# Patient Record
Sex: Male | Born: 1935 | Hispanic: No | Marital: Married | Smoking: Former smoker
Health system: Southern US, Community
[De-identification: ages and names within clinical notes are randomized; demographics above are authoritative.]

## PROBLEM LIST (undated history)

## (undated) DIAGNOSIS — C61 Malignant neoplasm of prostate: Secondary | ICD-10-CM

## (undated) DIAGNOSIS — E785 Hyperlipidemia, unspecified: Secondary | ICD-10-CM

## (undated) DIAGNOSIS — I10 Essential (primary) hypertension: Secondary | ICD-10-CM

## (undated) DIAGNOSIS — E119 Type 2 diabetes mellitus without complications: Secondary | ICD-10-CM

## (undated) DIAGNOSIS — R972 Elevated prostate specific antigen [PSA]: Secondary | ICD-10-CM

## (undated) DIAGNOSIS — K219 Gastro-esophageal reflux disease without esophagitis: Secondary | ICD-10-CM

## (undated) DIAGNOSIS — Z923 Personal history of irradiation: Secondary | ICD-10-CM

## (undated) HISTORY — DX: Hyperlipidemia, unspecified: E78.5

## (undated) HISTORY — DX: Elevated prostate specific antigen (PSA): R97.20

## (undated) HISTORY — DX: Gastro-esophageal reflux disease without esophagitis: K21.9

## (undated) HISTORY — PX: COLONOSCOPY: SHX174

## (undated) HISTORY — PX: REPLACEMENT TOTAL KNEE: SUR1224

## (undated) HISTORY — DX: Essential (primary) hypertension: I10

## (undated) HISTORY — DX: Type 2 diabetes mellitus without complications: E11.9

---

## 1998-04-30 HISTORY — PX: OTHER SURGICAL HISTORY: SHX169

## 1998-04-30 HISTORY — PX: ANGIOPLASTY: SHX39

## 1998-12-23 ENCOUNTER — Observation Stay (HOSPITAL_COMMUNITY): Admission: AD | Admit: 1998-12-23 | Discharge: 1998-12-24 | Payer: Self-pay | Admitting: Cardiology

## 1998-12-23 ENCOUNTER — Encounter: Payer: Self-pay | Admitting: Cardiology

## 2006-12-16 ENCOUNTER — Inpatient Hospital Stay (HOSPITAL_COMMUNITY): Admission: RE | Admit: 2006-12-16 | Discharge: 2006-12-19 | Payer: Self-pay | Admitting: Orthopedic Surgery

## 2007-08-06 ENCOUNTER — Inpatient Hospital Stay (HOSPITAL_COMMUNITY): Admission: RE | Admit: 2007-08-06 | Discharge: 2007-08-09 | Payer: Self-pay | Admitting: Orthopedic Surgery

## 2010-09-12 NOTE — Op Note (Signed)
NAMEBRADYN, Schwartz                  ACCOUNT NO.:  [id number]   MEDICAL RECORD NO.:  [id number]          PATIENT TYPE:  INP   LOCATION:  0001                         FACILITY:  [facility]   PHYSICIAN:  Ollen Gross, M.D.    DATE OF BIRTH:  03-24-1933   DATE OF PROCEDURE:  08/06/2007  DATE OF DISCHARGE:                               OPERATIVE REPORT   PREOPERATIVE DIAGNOSIS:  Osteoarthritis left knee.   POSTOPERATIVE DIAGNOSIS:  Osteoarthritis left knee.   PROCEDURE:  Left total knee arthroplasty.   SURGEON:  Ollen Gross, M.D.   ASSISTANT:  Alexzandrew L. Perkins, P.A.C.   ANESTHESIA:  General with postop Marcaine pain pump.   ESTIMATED BLOOD LOSS:  Minimal.   DRAINS:  Hemovac x1.   TOURNIQUET TIME:  Thirty-seven minutes at 300 mmHg.   COMPLICATIONS:  None.   CONDITION:  Stable to recovery.   CLINICAL NOTE:  Matthew Schwartz is a 77 year old male with end-stage arthritis  of the left knee with progressively worsening pain and dysfunction.  He  has had a previous right total knee arthroplasty and presents now for  left total knee arthroplasty.   PROCEDURE IN DETAIL:  After the successful administration of general  anesthetic a tourniquet was placed high on the left thigh and the left  lower extremity prepped and draped in the usual sterile fashion.  The  extremity was wrapped in Esmarch, knee flexed and tourniquet inflated to  300 mmHg.  Standard midline incision was made with a 10 blade through  the subcutaneous tissues to the level of the extensor mechanism.  Fresh  blade was used make a medial parapatellar arthrotomy.  The soft tissue  over the proximal medial tibia subperiosteally elevated to the joint  line with the knife and the semimembranosus bursa with a Cobb elevator.  Soft tissue laterally was elevated with attention being paid to avoid  patellar tendon on tibial tubercle.  Patella subluxed laterally, knee  flexed 90 degrees, ACL and PCL removed.  Drill was used to  create a  starting hole in the distal femur and the canal was thoroughly  irrigated.  The 5 degree left valgus alignment guide was placed and  referencing off the posterior condyles, rotations marked and the block  was pinned to remove 11 mm of the distal femur.  I took 11 because of a  preop flexion contracture.  Distal femoral resection was made with an  oscillating saw.  Sizing blocks placed and size 4 was most appropriate.  Rotations marked at the epicondylar axis.  Size 4 cutting block was  placed and the anterior-posterior and chamfer cuts made.   Tibia subluxed forward and menisci removed.  Extramedullary tibial  alignment guide was placed for referencing proximally at the medial  aspect of the tibial tubercle and distally along the second metatarsal  axis and the tibial crest.  Block was pinned to remove approximately 4  mm off the more deficient medial side.  Tibial resection was made with  an oscillating saw.  Peripheral osteophytes were removed.  Size 4 was  the most appropriate tibial component  and a proximal tibia repair with  the modular drill and keel punch for size 4.  Femoral preparation was  completed with intercondylar cut.   Size 4 mobile bearing tibial trial, size 4 posterior stabilized femoral  trial and a 10 mm posterior stabilized rotating platform inserted and  trial placed.  With the 10 we achieved full extension but had some varus  and valgus play.  I went to a 12.5 which we still achieved full  extension with excellent varus and valgus, anterior and posterior  stability throughout full range of motion.  Patella was everted and  thickness measured to be 27 mm.  Freehand resection was taken to 15 mm,  41 template was placed, lug holes were drilled, trial patella was placed  and it tracks normally.  Osteophytes were removed off the posterior  femur with the trial in place.  All trials were removed and the cut bone  surfaces prepared with pulsatile lavage.   Cement was mixed and once  ready for implantation the size 4 mobile bearing tibial tray, size 4  posterior stabilized femur and 41 patella are cemented into place.  Patella was held with the clamp.  Trial 12.5 insert was placed, knee  held in full extension, all extruded cement removed.  Once the cement  was fully hardened then the FloSeal was injected on the posterior  capsule.  The permanent 12.5 mm posterior stabilized rotating platform  insert was placed in the tibial tray.  FloSeal was injected in the  medial and lateral gutters and suprapatellar area.  Moist sponge was  placed and tourniquet released for a total time of 37 minutes.  The  sponges removed after 2 minutes and bleeding was stopped with  electrocautery.  I then thoroughly irrigated again.  I placed a Hemovac  drain and closed the arthrotomy with interrupted #1 PDS.  Flexion  against gravity was 135 degrees.  Subcu was closed with interrupted 2-0  Vicryl and subcuticular running 4-0 Monocryl.  Catheter for the Marcaine  pain pump was placed and the pump was initiated.  Steri-Strips and a  bulky sterile dressing were applied and drains hooked to suction.  He  was then placed into knee immobilizer, awakened and transported to  recovery in stable condition.      Ollen Gross, M.D.  Electronically Signed     FA/MEDQ  D:  08/06/2007  T:  08/06/2007  Job:  [id number]

## 2010-09-12 NOTE — Op Note (Signed)
NAMECARLISS, Matthew Schwartz                  ACCOUNT NO.:  [id number]   MEDICAL RECORD NO.:  [id number]          PATIENT TYPE:  INP   LOCATION:  N629                         FACILITY:  [facility]   PHYSICIAN:  Ollen Gross, M.D.    DATE OF BIRTH:  1933/01/20   DATE OF PROCEDURE:  12/16/2006  DATE OF DISCHARGE:                               OPERATIVE REPORT   PREOPERATIVE DIAGNOSIS:  Osteoarthritis right knee.   POSTOPERATIVE DIAGNOSIS:  Osteoarthritis right knee.   PROCEDURE:  Right total knee arthroplasty.   SURGEON:  Ollen Gross, M.D.   ASSISTANT:  Nurse assist.   ANESTHESIA:  General with postop Marcaine pain pump.   ESTIMATED BLOOD LOSS:  Minimal.   DRAINS:  None.   TOURNIQUET TIME:  40 minutes at 300 mmHg.   COMPLICATIONS:  None.   CONDITION:  Stable to recovery.   CLINICAL NOTE:  Mr. Vassell is a 77 year old male who has end-stage  arthritis of his right knee with progressively worsening pain and  dysfunction.  He has failed nonoperative management including injections  and presents for total knee arthroplasty.   PROCEDURE IN DETAIL:  After the successful administration of general  anesthetic a tourniquet is placed high in the right thigh; and the right  lower extremity is prepped and draped in the usual sterile fashion.  The  extremities wrapped in Esmarch, knee flexed, and tourniquet inflated to  300 mmHg.  Midline incision is made with a 10 blade through subcutaneous  tissue to the level of the extensor mechanism.  A fresh blade is used  make a medial parapatellar arthrotomy.  Soft tissue over the proximal  medial tibia is subperiosteally elevated to the joint line with the  knife; and into the semimembranosus bursa with a Cobb elevator.   Soft tissue laterally is elevated with attention being paid to avoid the  patellar tendon on tibial tubercle.  Patella subluxed laterally, knee  flexed 90 degrees, ACL and PCL removed.  Drill is used to create a  starting hole in the  distal femur; and canal is thoroughly irrigated.  A  5-degree right valgus alignment guide is placed.  Referencing off the  posterior condyles, rotation is marked; and approximately 11 mm is  resected off the distal femur.  I took 11 because of a preop flexion  contracture.  Distal femoral resection is made an oscillating saw.  Sizing blocks placed and a size 4 is most appropriate.  The rotation is  marked at the epicondylar axis sized for a cutting block placed; and the  anterior, posterior, and chamfer cuts are made.   The tibia is subluxed forward and the menisci are removed.  Extramedullary tibial alignment guide is placed referencing proximally,  at the medial aspect of the tibial tubercle and distally along the  second metatarsal axis and tibial crest.  The block is pinned to remove  10 mm of the nondeficient lateral side.  Tibial resection is made with  an oscillating saw.  A size 4 is the most appropriate tibial component;  and the proximal tibia is prepared with the modular  drill and keel punch  for a size 4.  Femoral preparation is completed with the intercondylar  cut.   A size 4 mobile bearing tibial trial, a size 4 posterior stabilized  femoral trial, and a 10-mm posterior stabilized rotating platform insert  trial are placed.  With the 10 full extension is achieved with excellent  varus and valgus balance throughout full range of motion.  Patella is  everted, and thickness measured to 23 mm.  Freehand resection was taken  13 mm, a 38 template is placed, lug holes were drilled, trial patella is  placed and it tracks normally.  Osteophytes removed off the posterior  femur with the trial in place.  All trials are removed and the cut bone  surfaces are prepared with pulsatile lavage.  Cement is mixed; and once  ready for implantation, a size 4 mobile bearing tibial tray, a size 4  posterior stabilized femur, and 38 patella are cemented into place.   The patella is held with  the clamp.  A Trial 10-mm insert is placed; and  the knee held in full extension and all extruded cement removed.  Once  the cement is fully hardened then the wound is copiously irrigated with  saline solution; and the trial insert removed.  The posterior capsule is  injected with FloSeal as is the suprapatellar area and medial and  lateral gutters.  The permanent 10-mm posterior stabilized rotating  platform insert is then placed into the tibial tray.  The tourniquet is  then released with a total time of 40 minutes.  Moist sponge is placed;  and after 2 minutes; it is removed, and any minor bleeding is identified  and stopped with cautery.  We irrigated, again, and then closed the  extensor mechanism with interrupted #1 PDS.  Flexion against gravity to  130 degrees.   Subcu is closed with interrupted 2-0 Vicryl, subcuticular running 4-0  Monocryl.  Catheter for the Marcaine pain pump is placed; and the pump  is initiated.  Steri-Strips and a bulky sterile dressing are applied.  He is placed into a knee immobilizer, awakened, and transported to  recovery in stable condition.      Ollen Gross, M.D.  Electronically Signed     FA/MEDQ  D:  12/16/2006  T:  12/16/2006  Job:  [id number]

## 2010-09-12 NOTE — H&P (Signed)
NAMECHARON, Matthew Schwartz                  ACCOUNT NO.:  [id number]   MEDICAL RECORD NO.:  [id number]        PATIENT TYPE:  LINP   LOCATION:                               FACILITY:  [facility]   PHYSICIAN:  Ollen Gross, M.D.    DATE OF BIRTH:  January 20, 1933   DATE OF ADMISSION:  12/16/2006  DATE OF DISCHARGE:                              HISTORY & PHYSICAL   CHIEF COMPLAINT:  Right knee pain.   HISTORY OF PRESENT ILLNESS:  The patient is a 77 year old male seen by  Dr. Lequita Halt for evaluation of both knees.  The right is more problematic  than the left.  He has a hard time getting around for quite some time  now.  He has been treated by Dr. Corliss Skains in the past conservatively  and also including multiple injections.  He is at stage now where the  injections are not helping.  He is seen by Dr. Lequita Halt and found to have  end-stage arthritis worse in the left bone-on-bone.  It is felt he has  reached the point where he would benefit from undergoing knee  replacement.  Risks and benefits have been discussed, and he elects to  proceed with surgery.  We do have a note from Dr. Catha Gosselin who felt  that he is medically cleared.  Dr. Clarene Duke put in his note that he has  also seen Dr. Amil Amen and cleared from a cardiology standpoint.  The  patient was subsequently admitted to the hospital.   ALLERGIES:  No known drug allergies.   CURRENT MEDICATIONS:  Metformin, isosorbide, Altace, Toprol, Lipitor,  nizatidine, Lozol, amlodipine, aspirin.   PAST MEDICAL HISTORY:  Hypertension, non-insulin-dependent diabetes  mellitus.   PAST SURGICAL HISTORY:  Cataract surgery in both eyes, heart  catheterization.   SOCIAL HISTORY:  Married, retired, nonsmoker.  No alcohol.  Two  daughters.   FAMILY HISTORY:  Mother with history of stroke and diabetes.  Brother  with history of stroke, diabetes.   REVIEW OF SYSTEMS:  GENERAL:  No fevers, chills, night sweats.  NEUROLOGICAL:  No seizures, syncope or paralysis.   RESPIRATORY:  Shortness of breath, productive cough or hemoptysis.  CARDIOVASCULAR:  No chest pain, angina, orthopnea.  GI:  No nausea, vomiting, diarrhea or  constipation.  GU:  No dysuria, hematuria or discharge.  MUSCULOSKELETAL:  Right knee.   PHYSICAL EXAMINATION:  VITAL SIGNS:  Pulse 64, respirations 12, blood  pressure 130/68.  GENERAL:  A 77 year old male well-nourished, well-developed, slightly  overweight.  No acute distress, mildly anxious.  He is accompanied by  his wife.  HEENT:  Normocephalic, atraumatic.  Pupils are round, reactive.  Oropharynx clear.  EOMs intact.  NECK:  Supple.  CHEST:  Clear, anterior posterior chest walls; no rhonchi, rales or  wheezing.  HEART:  Regular rate and rhythm.  No murmur, S1, S2 noted.  ABDOMEN:  Soft, slightly round, nontender.  Bowel sounds are present.  BREASTS/GENITALIA:  Not done and not pertinent to present illness.  EXTREMITIES:  Right knee shows no effusion.  Range of motion 10 to 115.  He has  a slight varus malalignment deformity.  No instability.   IMPRESSION:  1. Osteoarthritis right knee greater than osteoarthritis left knee.  2. Hypertension.  3. Non-insulin diabetes mellitus.   PLAN:  The patient will be admitted to [facility] to undergo a  right total knee replacement arthroplasty.  Surgery will be performed by  Dr. Ollen Gross.      Matthew Schwartz, P.A.C.      Ollen Gross, M.D.  Electronically Signed    ALP/MEDQ  D:  12/02/2006  T:  12/02/2006  Job:  [id number]   cc:   Caryn Bee L. Little, M.D.  Fax: [phone]   Francisca December, M.D.  Fax: [phone]   Ollen Gross, M.D.  Fax: [phone]

## 2010-09-12 NOTE — H&P (Signed)
NAMEKRISTON, Matthew Schwartz                  ACCOUNT NO.:  [id number]   MEDICAL RECORD NO.:  [id number]          PATIENT TYPE:  INP   LOCATION:  NA                           FACILITY:  [facility]   PHYSICIAN:  Ollen Gross, M.D.    DATE OF BIRTH:  12/05/1932   DATE OF ADMISSION:  08/06/2007  DATE OF DISCHARGE:                              HISTORY & PHYSICAL   DATE OF OFFICE VISIT HISTORY AND PHYSICAL:  Performed on August 01, 2007   CHIEF COMPLAINT:  Right knee pain.   HISTORY OF PRESENT ILLNESS:  The patient is a 77 year old male who has  been seen by Dr. Lequita Halt for bilateral knee pain.  He has undergone a  right total knee back in August of 2008 and has had a successful  replacement.  He has continued left knee pain and now presents for total  knee on the other side.   ALLERGIES:  NO KNOWN DRUG ALLERGIES.   CURRENT MEDICATIONS:  1. Toprol XL.  2. Lipitor.  3. Altace.  4. Metformin.  5. Nizatidine.  6. Lovaza.  7. Avodart.  8. Amlodipine.   PAST MEDICAL HISTORY:  1. Hypertension.  2. Non-insulin-dependent diabetes mellitus.  3. Hypercholesterolemia.  4. Past history of gastritis.  5. The patient states he also had some irritable bowel syndrome (the      constipation type).   PAST SURGICAL HISTORY:  1. Right total knee in August of 2008.  2. Cataracts in both eyes.  3. Heart catheterization in 2000.   SOCIAL HISTORY:  Married, retired.  Nonsmoker.  No alcohol.  Two  daughters.   FAMILY HISTORY:  Mother with a history of stroke and diabetes.  Father  deceased of natural causes.  Brother with a history of stroke and  diabetes.   REVIEW OF SYSTEMS:  GENERAL:  No fevers, chills, night sweats.  NEUROLOGIC:  No seizures, syncope or paralysis.  RESPIRATORY:  No  shortness of breath, productive cough or hemoptysis.  CARDIOVASCULAR:  No chest pain, angina, orthopnea.  GI:  Occasional constipation.  No  nausea, vomiting, no diarrhea.  GU:  No dysuria, hematuria, discharge.  MUSCULOSKELETAL:  Left knee.   PHYSICAL EXAMINATION:  VITAL SIGNS:  Pulse 64, respirations 18, blood  pressure 112/54.  GENERAL:  A 77 year old male, well-nourished, well-developed, tall  frame, slightly overweight, alert, oriented and cooperative.  HEENT:  Normocephalic, atraumatic.  Pupils are round and reactive.  Oropharynx clear.  Extraocular movements intact.  NECK:  Supple.  CHEST:  Clear anterior and posterior chest walls.  No rhonchi, rales or  wheezing.  HEART:  Regular rate and rhythm.  No murmur.  S1 and S2 noted.  ABDOMEN:  Soft, slightly round.  Bowel sounds present.  RECTAL/BREAST/GENITALIA:  Not done; not pertinent to present illness.  EXTREMITIES:  Left knee with varus malalignment deformity.  Range of  motion 10-110.  Marked crepitus on range of motion.  No instability.   IMPRESSION:  Osteoarthritis, left knee.   PLAN:  The patient was admitted to [facility] to undergo a  left total knee replacement arthroplasty.  Surgery will be performed by  Dr. Ollen Gross.      Alexzandrew L. Perkins, P.A.C.      Ollen Gross, M.D.  Electronically Signed    ALP/MEDQ  D:  08/06/2007  T:  08/06/2007  Job:  [id number]   cc:   Courtney Paris, M.D.  Fax: [phone]   Anna Genre. Little, M.D.  Fax: [phone]   Francisca December, M.D.  Fax: [phone]

## 2010-09-15 NOTE — Discharge Summary (Signed)
NAMEAERIK, Matthew Schwartz                  ACCOUNT NO.:  [id number]   MEDICAL RECORD NO.:  [id number]          PATIENT TYPE:  INP   LOCATION:  1606                         FACILITY:  [facility]   PHYSICIAN:  Ollen Gross, M.D.    DATE OF BIRTH:  1932/11/09   DATE OF ADMISSION:  12/16/2006  DATE OF DISCHARGE:  12/19/2006                               DISCHARGE SUMMARY   ADMITTING DIAGNOSIS:  1. Osteoarthritis right knee, greater than left knee.  2. Hypertension.  3. Non-insulin dependent diabetes mellitus.   DISCHARGE DIAGNOSES:  1. Osteoarthritis right knee, status post right total knee      arthroplasty.  2. Postop blood loss anemia, did not require transfusion.  3. Mild postop hyponatremia.  4. Hypertension.  5. Non-insulin dependent diabetes mellitus.   PROCEDURE:  08/18/20008 Right total knee surgery with Dr. Lequita Halt.  Tourniquet time 40 minute.   CONSULTATIONS:  None.   BRIEF HISTORY:  Mr. Mccord is a 77 year old male with end stage arthritis  of right knee, progressively worsening pain with flexion, failure  nonoperative management, now presents for total knee.   LABORATORY DATA:  CBC:  Hemoglobin 12.9, hematocrit 38.6, white blood  cell count 6.7. Postop hemoglobin 10.8, drift down to 9.2.  Last noted  H/H 8.8 and 26.5.  PT/PTT on admission 12.5 and 25 respectively.  INR  0.9.  Serial protime followed, last noted PTR 18.7and 1.5.  Chem panel on admission all within normal limits with the exception of  elevated potassium of 5.2.  Serial BMETs were followed and potassium  came down to normal level of 4.0.  Sodium did drop from 139, last noted  at 133 minimal low at discharge.  Urinalysis negative.  Blood group type  B positive.  Chest x-ray 12/09/2006:  No active disease.   HOSPITAL COURSE:  The patient was admitted to [facility],  tolerated the procedure well, later transferred to recovery room on  orthopedic floor, started on PCA and oral analgesic pain control  following surgery.  He actually did pretty well on the evening of  surgery. He started getting up out of bed on day 1, started weaning over  to oral meds.  By day 2, he was progressing with physical therapy  requiring minimal assist, walking about 60 feet.  Dressing was changed  on day 2, incision healing well, no sign of infection.  We discontinued  the PC, IVs and Foleys.  He continued to progress well and was ready to  go home by day 3, 12/19/2006.   DISCHARGE PLAN:  1. The patient was discharged home on 12/19/2006.  2. Discharge diagnosis: Please see above.  3. Discharge medications:  Percocet, Robaxin and Coumadin.  4. Diet:  Resume home diet.  5. Activity:  Weight bearing as tolerated right lower extremity.  Home      health PT and well nursing.  6. Followup:  2 weeks.  7. Disposition:  To home.  8. Condition on discharge:  Improved.      Alexzandrew L. Perkins, P.A.C.      Ollen Gross, M.D.  Electronically  Signed    ALP/MEDQ  D:  01/22/2007  T:  01/23/2007  Job:  [id number]   cc:   Caryn Bee L. Little, M.D.  Fax: [phone]   Francisca December, M.D.  Fax: [phone]   Ollen Gross, M.D.  Fax: [phone]

## 2010-09-15 NOTE — Discharge Summary (Signed)
NAMEJONAH, Matthew Schwartz                  ACCOUNT NO.:  [id number]   MEDICAL RECORD NO.:  [id number]          PATIENT TYPE:  INP   LOCATION:  1617                         FACILITY:  [facility]   PHYSICIAN:  Ollen Gross, M.D.    DATE OF BIRTH:  [birth year]   DATE OF ADMISSION:  08/06/2007  DATE OF DISCHARGE:  08/09/2007                               DISCHARGE SUMMARY   ADMITTING DIAGNOSES:  1. Osteoarthritis left knee.  2. Hypertension.  3. Non-insulin-dependent diabetes mellitus.  4. Hypercholesterolemia.  5. History of gastritis.  6. Irritable bowel syndrome (constipation type).   DISCHARGE DIAGNOSES:  1. Osteoarthritis left knee status post left total knee replacement      arthroplasty.  2. Acute blood loss anemia.  3. Status post transfusion without sequelae.  4. Postop hyponatremia improved.  5. Mild postop hypokalemia.  6. Hypertension.  7. Non-insulin-dependent diabetes mellitus.  8. Hypercholesterolemia.  9. History of gastritis.  10.Irritable bowel syndrome (constipation type).   PROCEDURES:  August 06, 2007, left total knee.   SURGEON:  Ollen Gross, M.D.   ASSISTANT:  Alexzandrew L. Perkins, P.A.C.   ANESTHESIA:  General.   TOURNIQUET TIME:  37 minutes.   CONSULTS:  None.   BRIEF HISTORY:  Matthew Schwartz is a 77 year old male with end-stage arthritis  of the left knee with progressive worsening pain and dysfunction,  previous right total knee and now presents for left total knee.   LABORATORY DATA:  Preop CBC showed a hemoglobin of 12.7, hematocrit of  37.4, white cell count 6.2, platelets 240.  Serial CBCs were followed.  Hemoglobin dropped down to 9.3 then to 7.7, given 2 units of blood, post  transfusion hemoglobin of 9.1 and 26.8.  PT/PTT preop 12.8 and 27  respectively.  Serial pro-times followed.  Last known PT/INR 19.9 and  1.6.  Chem panel on admission slightly elevated glucose of 133.  Remaining chem panel within normal limits.  Serial BMETs were followed.  Sodium did drop from 136 to 134 and back up to 136.  Potassium dropped  4.0 to 3.5, only dropped another tenth down to 3.4 by the time of  discharge.  Preop UA negative.  Blood group type B+.   HOSPITAL COURSE:  The patient was admitted to [facility] and  tolerated the procedure well and later transferred to the recovery room  on the orthopedic floor and started on PCA and p.o. analgesics for pain  control following surgery.  He was doing pretty well after surgery  except his belly felt a little bit uncomfortable.  He had had problems  with constipation, that was his biggest issue with his last surgery so  we started on medication to help with this.  Hemoglobin was 9.3 postop.  Blood pressure was stable.  Started back on his home meds but put some  of those on parameters and history of diabetes.  Started him on iron  supplement and started getting up with therapy and actually did  extremely well.  He got up and out of bed and walking.  By day 2 he was  walking over 190 feet.  Hemoglobin was a little bit further though on  day 2 down to 7.7.  We gave him 2 units of blood but his hemoglobin  responded well, it came back to 9.1.  Dressing change - incision looked  good.  Felt just needed a little bit more time in therapy.  Continued to  progress well and by August 09, 2007, hemoglobin was stable.  Working  with therapy, tolerating meds and discharged home.   DISCHARGE PLAN:  1. The patient was discharged home on August 09, 2007.  2. Discharge diagnoses - please see above.  3. Discharge meds are Percocet, Robaxin, Nu-Iron, Coumadin, over-the-      counter stool softener.   DIET:  Heart-healthy diabetic diet.   ACTIVITY:  Weightbearing as tolerated left leg total knee protocol.   HOME OT/PT:  Home with nursing.   FOLLOWUP:  In 2 weeks.   DISPOSITION:  Home.   CONDITION ON DISCHARGE:  Improved.      Alexzandrew L. Perkins, P.A.C.      Ollen Gross, M.D.   Electronically Signed    ALP/MEDQ  D:  09/11/2007  T:  09/11/2007  Job:  [id number]   cc:   Ollen Gross, M.D.  Fax: [phone]   Courtney Paris, M.D.  Fax: [phone]   Anna Genre. Little, M.D.  Fax: [phone]   Francisca December, M.D.  Fax: [phone]

## 2011-01-23 LAB — URINALYSIS, ROUTINE W REFLEX MICROSCOPIC
Glucose, UA: NEGATIVE
Hgb urine dipstick: NEGATIVE
Ketones, ur: NEGATIVE
Protein, ur: NEGATIVE

## 2011-01-23 LAB — BASIC METABOLIC PANEL
BUN: 10
CO2: 24
CO2: 24
CO2: 25
Chloride: 105
Chloride: 106
Creatinine, Ser: 0.77
GFR calc Af Amer: >60
GFR calc Af Amer: >60
GFR calc non Af Amer: >60
GFR calc non Af Amer: >60
Glucose, Bld: 146 — ABNORMAL HIGH
Glucose, Bld: 164 — ABNORMAL HIGH
Potassium: 3.4 — ABNORMAL LOW
Potassium: 3.5
Potassium: 4
Sodium: 136
Sodium: 137

## 2011-01-23 LAB — CBC
HCT: 22.1 — ABNORMAL LOW
HCT: 26.8 — ABNORMAL LOW
HCT: 27 — ABNORMAL LOW
HCT: 37.4 — ABNORMAL LOW
Hemoglobin: 12.7 — ABNORMAL LOW
Hemoglobin: 7.7 — CL
Hemoglobin: 9.1 — ABNORMAL LOW
Hemoglobin: 9.3 — ABNORMAL LOW
MCHC: 34.1
MCHC: 34.9
MCV: 80.4
MCV: 80.5
MCV: 80.7
Platelets: 143 — ABNORMAL LOW
Platelets: 240
RBC: 2.75 — ABNORMAL LOW
RBC: 3.36 — ABNORMAL LOW
RBC: 4.63
RDW: 15.4
RDW: 15.9 — ABNORMAL HIGH
RDW: 16 — ABNORMAL HIGH
WBC: 6.2
WBC: 8.2

## 2011-01-23 LAB — TYPE AND SCREEN

## 2011-01-23 LAB — COMPREHENSIVE METABOLIC PANEL
ALT: 20
AST: 20
Albumin: 3.7
Alkaline Phosphatase: 54
BUN: 12
CO2: 25
Calcium: 9.2
Chloride: 104
Creatinine, Ser: 0.89
GFR calc Af Amer: >60
GFR calc non Af Amer: >60
Glucose, Bld: 133 — ABNORMAL HIGH
Potassium: 4
Sodium: 136
Total Bilirubin: 0.8
Total Protein: 7

## 2011-01-23 LAB — PROTIME-INR
INR: 0.9
INR: 1.2
Prothrombin Time: 12.8
Prothrombin Time: 18.7 — ABNORMAL HIGH

## 2011-01-23 LAB — APTT: aPTT: 27

## 2011-02-09 LAB — CBC
HCT: 26.5 — ABNORMAL LOW
HCT: 27 — ABNORMAL LOW
Hemoglobin: 10.8 — ABNORMAL LOW
Hemoglobin: 8.8 — ABNORMAL LOW
Hemoglobin: 9.2 — ABNORMAL LOW
MCHC: 33.2
MCHC: 33.7
MCV: 80.8
MCV: 81.5
MCV: 81.9
RBC: 3.24 — ABNORMAL LOW
RBC: 3.98 — ABNORMAL LOW
WBC: 7.7
WBC: 8.4
WBC: 9.8

## 2011-02-09 LAB — TYPE AND SCREEN: ABO/RH(D): B POS

## 2011-02-09 LAB — BASIC METABOLIC PANEL
CO2: 26
Calcium: 8.5
Chloride: 104
Chloride: 106
Creatinine, Ser: 0.83
GFR calc Af Amer: >60
GFR calc non Af Amer: >60
Potassium: 4
Sodium: 133 — ABNORMAL LOW
Sodium: 137

## 2011-02-09 LAB — PROTIME-INR
INR: 1.1
Prothrombin Time: 14.1

## 2011-02-09 LAB — ABO/RH: ABO/RH(D): B POS

## 2011-02-12 LAB — COMPREHENSIVE METABOLIC PANEL
ALT: 27
AST: 22
CO2: 28
Chloride: 105
Creatinine, Ser: 0.78
GFR calc Af Amer: >60
GFR calc non Af Amer: >60
Glucose, Bld: 137 — ABNORMAL HIGH
Total Bilirubin: 0.7

## 2011-02-12 LAB — URINALYSIS, ROUTINE W REFLEX MICROSCOPIC
Glucose, UA: NEGATIVE
Hgb urine dipstick: NEGATIVE
Specific Gravity, Urine: 1.006
Urobilinogen, UA: 0.2
pH: 7.5

## 2011-02-12 LAB — CBC
Hemoglobin: 12.9 — ABNORMAL LOW
MCV: 80.9
RBC: 4.78
WBC: 6.7

## 2012-12-18 ENCOUNTER — Other Ambulatory Visit: Payer: Self-pay | Admitting: Urology

## 2012-12-18 DIAGNOSIS — R972 Elevated prostate specific antigen [PSA]: Secondary | ICD-10-CM

## 2012-12-26 ENCOUNTER — Ambulatory Visit (HOSPITAL_COMMUNITY)
Admission: RE | Admit: 2012-12-26 | Discharge: 2012-12-26 | Disposition: A | Discharge status: Home / Self Care | Payer: Medicare Other | Source: Ambulatory Visit | Attending: Urology | Admitting: Urology

## 2012-12-26 DIAGNOSIS — R599 Enlarged lymph nodes, unspecified: Secondary | ICD-10-CM | POA: Insufficient documentation

## 2012-12-26 DIAGNOSIS — R972 Elevated prostate specific antigen [PSA]: Secondary | ICD-10-CM

## 2012-12-26 DIAGNOSIS — Z8546 Personal history of malignant neoplasm of prostate: Secondary | ICD-10-CM | POA: Insufficient documentation

## 2012-12-26 LAB — CREATININE, SERUM
Creatinine, Ser: 0.81 mg/dL (ref 0.50–1.35)
GFR calc Af Amer: >90 mL/min (ref >90)
GFR calc non Af Amer: 84 mL/min — ABNORMAL LOW (ref >90)

## 2012-12-26 MED ORDER — GADOBENATE DIMEGLUMINE 529 MG/ML IV SOLN
19.0000 mL | Freq: Once | INTRAVENOUS | Status: AC | PRN
  Administered 2012-12-26: 19 mL via INTRAVENOUS

## 2013-06-14 ENCOUNTER — Encounter: Payer: Self-pay | Admitting: *Deleted

## 2013-06-14 DIAGNOSIS — K219 Gastro-esophageal reflux disease without esophagitis: Secondary | ICD-10-CM | POA: Insufficient documentation

## 2013-06-14 DIAGNOSIS — E785 Hyperlipidemia, unspecified: Secondary | ICD-10-CM | POA: Insufficient documentation

## 2013-06-14 DIAGNOSIS — I10 Essential (primary) hypertension: Secondary | ICD-10-CM | POA: Insufficient documentation

## 2013-10-22 ENCOUNTER — Encounter: Payer: Self-pay | Admitting: Cardiology

## 2013-11-18 ENCOUNTER — Ambulatory Visit: Payer: Medicare Other | Admitting: Cardiology

## 2013-11-19 ENCOUNTER — Encounter: Payer: Self-pay | Admitting: Cardiology

## 2013-12-23 DIAGNOSIS — C61 Malignant neoplasm of prostate: Secondary | ICD-10-CM

## 2013-12-23 HISTORY — PX: PROSTATE BIOPSY: SHX241

## 2013-12-23 HISTORY — DX: Malignant neoplasm of prostate: C61

## 2013-12-24 ENCOUNTER — Ambulatory Visit: Payer: Medicare Other | Admitting: Cardiology

## 2013-12-25 ENCOUNTER — Ambulatory Visit (INDEPENDENT_AMBULATORY_CARE_PROVIDER_SITE_OTHER): Payer: Medicare Other | Admitting: Cardiology

## 2013-12-25 ENCOUNTER — Encounter: Payer: Self-pay | Admitting: Cardiology

## 2013-12-25 VITALS — BP 138/76 | HR 84 | Ht 70.0 in | Wt 213.0 lb

## 2013-12-25 DIAGNOSIS — I2510 Atherosclerotic heart disease of native coronary artery without angina pectoris: Secondary | ICD-10-CM

## 2013-12-25 DIAGNOSIS — I10 Essential (primary) hypertension: Secondary | ICD-10-CM

## 2013-12-25 DIAGNOSIS — E7800 Pure hypercholesterolemia, unspecified: Secondary | ICD-10-CM

## 2013-12-25 NOTE — Patient Instructions (Signed)
The current medical regimen is effective;  continue present plan and medications.  Follow up in 1 year with Dr Skains.  You will receive a letter in the mail 2 months before you are due.  Please call us when you receive this letter to schedule your follow up appointment.  

## 2013-12-25 NOTE — Progress Notes (Signed)
[facility] [address], [place]  [address] Phone: [phone] Fax:  [phone]  Date:  12/25/2013   ID:  Matthew Schwartz, DOB 1932-07-31, MRN [id number]  PCP:  Gennette Pac, MD   History of Present Illness: Matthew Schwartz is a 80 y.o. male with coronary artery disease PTCA (no stent) to RCA, diabetes, hypertension, hyperlipidemia here for followup last stress test in 2007 showed mild to moderate ischemia in the inferoapical/apical lateral region. EF was 59%.   He has been doing very well with no exertional angina, no shortness of breath. He has a strictly vegetarian diet. He lives periodically with his children. He stated he had no income.  He told me about a story where he was concerned about the financial aspects of his heart disease when he was first diagnosed and Dr. Leonia Reeves was very kind regarding this matter. [name]    Wt Readings from Last 3 Encounters:  12/25/13 213 lb (96.616 kg)     Past Medical History  Diagnosis Date  . Diabetes mellitus without complication   . Hypertension   . Dyslipidemia   . GERD (gastroesophageal reflux disease)   . PSA elevation     No past surgical history on file.  Current Outpatient Prescriptions  Medication Sig Dispense Refill  . AMLODIPINE BESYLATE PO Take 10 mg by mouth.      Marland Kitchen aspirin 81 MG tablet Take 81 mg by mouth daily.      Marland Kitchen atorvastatin (LIPITOR) 40 MG tablet Take 40 mg by mouth daily.      Marland Kitchen dutasteride (AVODART) 0.5 MG capsule Take 0.5 mg by mouth daily.      . ISOSORBIDE MONONITRATE PO Take 60 mg by mouth.      . metFORMIN (GLUCOPHAGE) 500 MG tablet Take by mouth 2 (two) times daily with a meal.      . METOPROLOL SUCCINATE ER PO Take 50 mg by mouth.      . nizatidine (AXID) 150 MG capsule Take 150 mg by mouth 2 (two) times daily.      Marland Kitchen PROCTOSOL HC 2.5 % rectal cream       . ramipril (ALTACE) 10 MG capsule Take 10 mg by mouth daily.       No current facility-administered  medications for this visit.    Allergies:    Allergies  Allergen Reactions  . Losartan Potassium     Social History:  The patient  reports that he quit smoking about 20 years ago. He does not have any smokeless tobacco history on file. He reports that he does not drink alcohol or use illicit drugs.   Family History  Problem Relation Age of Onset  . CVA Mother   . Heart disease Sister   . Coronary artery disease Sister   . Heart disease Brother   . Coronary artery disease Brother   . Heart disease Brother   . Coronary artery disease Brother     ROS:  Please see the history of present illness.   No fevers, no chills, no orthopnea, no syncope, no PND  All other systems reviewed and negative.   PHYSICAL EXAM: VS:  BP 138/76  Pulse 84  Ht 5\' 10"  (1.778 m)  Wt 213 lb (96.616 kg)  BMI 30.56 kg/m2 Well nourished, well developed, in no acute distress HEENT: normal, [place]/AT, EOMI Neck: no JVD, normal carotid upstroke, no bruit Cardiac:  normal S1, S2; RRR; no  murmur Lungs:  clear to auscultation bilaterally, no wheezing, rhonchi or rales Abd: soft, nontender, no hepatomegaly, no bruits Ext: no edema, 2+ distal pulses Skin: warm and dry GU: deferred Neuro: no focal abnormalities noted, AAO x 3  EKG:  12/25/13-sinus rhythm, LVH, nonspecific ST-T wave changes Labs: LDL 78  ASSESSMENT AND PLAN:  1. Coronary artery disease-currently stable, doing well, no anginal symptoms. Aggressive secondary prevention. Current antianginal medications reviewed. We will continue. He appreciates with Dr. Ilda Foil did for him. 2. Hyperlipidemia-LDL goal less than 70. Atorvastatin. LDL currently 78. 3. Hypertension-well-controlled. Medications reviewed. 4. One-year followup  Signed, Candee Furbish, MD [facility]  12/25/2013 4:07 PM

## 2013-12-28 ENCOUNTER — Other Ambulatory Visit (HOSPITAL_COMMUNITY): Payer: Self-pay | Admitting: Physical Medicine and Rehabilitation

## 2013-12-28 ENCOUNTER — Other Ambulatory Visit: Payer: Self-pay | Admitting: Urology

## 2013-12-28 DIAGNOSIS — C61 Malignant neoplasm of prostate: Secondary | ICD-10-CM

## 2014-01-11 ENCOUNTER — Encounter (HOSPITAL_COMMUNITY)
Admission: RE | Admit: 2014-01-11 | Discharge: 2014-01-11 | Disposition: A | Discharge status: Home / Self Care | Payer: Medicare Other | Source: Ambulatory Visit | Attending: Physical Medicine and Rehabilitation | Admitting: Physical Medicine and Rehabilitation

## 2014-01-11 DIAGNOSIS — C61 Malignant neoplasm of prostate: Secondary | ICD-10-CM | POA: Insufficient documentation

## 2014-01-11 MED ORDER — TECHNETIUM TC 99M MEDRONATE IV KIT
27.5000 | PACK | Freq: Once | INTRAVENOUS | Status: AC | PRN
  Administered 2014-01-11: 27.5 via INTRAVENOUS

## 2014-01-28 ENCOUNTER — Other Ambulatory Visit: Payer: Self-pay | Admitting: Urology

## 2014-01-28 DIAGNOSIS — R599 Enlarged lymph nodes, unspecified: Secondary | ICD-10-CM

## 2014-01-28 DIAGNOSIS — R591 Generalized enlarged lymph nodes: Secondary | ICD-10-CM

## 2014-01-28 DIAGNOSIS — C61 Malignant neoplasm of prostate: Secondary | ICD-10-CM

## 2014-02-09 ENCOUNTER — Encounter (HOSPITAL_COMMUNITY): Payer: Self-pay | Admitting: Pharmacy Technician

## 2014-02-11 ENCOUNTER — Other Ambulatory Visit: Payer: Self-pay | Admitting: Radiology

## 2014-02-12 ENCOUNTER — Ambulatory Visit (HOSPITAL_COMMUNITY)
Admission: RE | Admit: 2014-02-12 | Discharge: 2014-02-12 | Disposition: A | Discharge status: Home / Self Care | Payer: Medicare Other | Source: Ambulatory Visit | Attending: Urology | Admitting: Urology

## 2014-02-12 ENCOUNTER — Encounter (HOSPITAL_COMMUNITY): Payer: Self-pay

## 2014-02-12 DIAGNOSIS — C61 Malignant neoplasm of prostate: Secondary | ICD-10-CM | POA: Diagnosis not present

## 2014-02-12 DIAGNOSIS — R591 Generalized enlarged lymph nodes: Secondary | ICD-10-CM | POA: Diagnosis present

## 2014-02-12 DIAGNOSIS — R599 Enlarged lymph nodes, unspecified: Secondary | ICD-10-CM

## 2014-02-12 LAB — CBC
HEMATOCRIT: 39.2 % (ref 39.0–52.0)
HEMOGLOBIN: 13.2 g/dL (ref 13.0–17.0)
MCH: 27.1 pg (ref 26.0–34.0)
MCHC: 33.7 g/dL (ref 30.0–36.0)
MCV: 80.5 fL (ref 78.0–100.0)
Platelets: 252 10*3/uL (ref 150–400)
RBC: 4.87 MIL/uL (ref 4.22–5.81)
RDW: 15.3 % (ref 11.5–15.5)
WBC: 7.1 10*3/uL (ref 4.0–10.5)

## 2014-02-12 LAB — GLUCOSE, CAPILLARY: Glucose-Capillary: 129 mg/dL — ABNORMAL HIGH (ref 70–99)

## 2014-02-12 LAB — APTT: APTT: 29 s (ref 24–37)

## 2014-02-12 LAB — PROTIME-INR
INR: 1.01 (ref 0.00–1.49)
Prothrombin Time: 13.4 s (ref 11.6–15.2)

## 2014-02-12 MED ORDER — MIDAZOLAM HCL 2 MG/2ML IJ SOLN
INTRAMUSCULAR | Status: DC
Start: 2014-02-12 — End: 2014-02-13
  Filled 2014-02-12: qty 2

## 2014-02-12 MED ORDER — FENTANYL CITRATE 0.05 MG/ML IJ SOLN
INTRAMUSCULAR | Status: AC | PRN
  Administered 2014-02-12: 100 ug via INTRAVENOUS

## 2014-02-12 MED ORDER — MIDAZOLAM HCL 2 MG/2ML IJ SOLN
INTRAMUSCULAR | Status: AC | PRN
  Administered 2014-02-12: 2 mg via INTRAVENOUS
  Administered 2014-02-12: 1 mg via INTRAVENOUS

## 2014-02-12 MED ORDER — FENTANYL CITRATE 0.05 MG/ML IJ SOLN
INTRAMUSCULAR | Status: AC
  Filled 2014-02-12: qty 2

## 2014-02-12 MED ORDER — HYDROCODONE-ACETAMINOPHEN 5-325 MG PO TABS
1.0000 | ORAL_TABLET | ORAL | Status: DC | PRN
  Filled 2014-02-12: qty 2

## 2014-02-12 MED ORDER — SODIUM CHLORIDE 0.9 % IV SOLN
INTRAVENOUS | Status: DC
  Administered 2014-02-12: 500 mL via INTRAVENOUS

## 2014-02-12 NOTE — H&P (Signed)
Chief Complaint: "I am here for a biopsy."  Referring Physician(s): Dahlstedt,Stephen M  History of Present Illness: Matthew Schwartz is a 80 y.o. male with history of high grade prostate cancer and follows with Dr. Diona Fanti. CT done in urology office revealed right pelvic lymphadenopathy and request is made for image guided biopsy. He denies any chest pain, shortness of breath or palpitations. He denies any active signs of bleeding or excessive bruising. He denies any recent fever or chills. The patient denies any history of sleep apnea or chronic oxygen use. He has previously tolerated sedation without complications.   Past Medical History  Diagnosis Date  . Diabetes mellitus without complication   . Hypertension   . Dyslipidemia   . GERD (gastroesophageal reflux disease)   . PSA elevation     Past Surgical History  Procedure Laterality Date  . Replacement total knee  2008/2009  . Angioplasty  2000  . Cataract surgery  2000  . Colonoscopy      Allergies: Losartan potassium  Medications: Prior to Admission medications   Medication Sig Start Date End Date Taking? Authorizing Provider  AMLODIPINE BESYLATE PO Take 10 mg by mouth every morning.    Yes Historical Provider, MD  aspirin 81 MG tablet Take 81 mg by mouth every morning.    Yes Historical Provider, MD  atorvastatin (LIPITOR) 40 MG tablet Take 40 mg by mouth at bedtime.    Yes Historical Provider, MD  dutasteride (AVODART) 0.5 MG capsule Take 0.5 mg by mouth at bedtime.   Yes Historical Provider, MD  isosorbide mononitrate (IMDUR) 60 MG 24 hr tablet Take 60 mg by mouth every evening.   Yes Historical Provider, MD  metFORMIN (GLUCOPHAGE-XR) 500 MG 24 hr tablet Take 500 mg by mouth 2 (two) times daily.   Yes Historical Provider, MD  metoprolol (LOPRESSOR) 50 MG tablet Take 50 mg by mouth 2 (two) times daily.   Yes Historical Provider, MD  nizatidine (AXID) 150 MG capsule Take 150 mg by mouth 2 (two) times daily as needed  for heartburn.    Yes Historical Provider, MD  Polyethylene Glycol 400 (BLINK TEARS OP) Apply 1 drop to eye daily as needed (dry eyes.).   Yes Historical Provider, MD  PROCTOSOL HC 2.5 % rectal cream Place 1 application rectally daily as needed for hemorrhoids or itching.  11/06/13  Yes Historical Provider, MD  ramipril (ALTACE) 10 MG capsule Take 10 mg by mouth 2 (two) times daily.    Yes Historical Provider, MD  ketoconazole (NIZORAL) 2 % cream Apply 1 application topically daily as needed for irritation.    Historical Provider, MD    Family History  Problem Relation Age of Onset  . CVA Mother   . Heart disease Sister   . Coronary artery disease Sister   . Heart disease Brother   . Coronary artery disease Brother   . Heart disease Brother   . Coronary artery disease Brother     History   Social History  . Marital Status: Married    Spouse Name: N/A    Number of Children: N/A  . Years of Education: N/A   Social History Main Topics  . Smoking status: Former Smoker    Quit date: 06/14/1993  . Smokeless tobacco: Not on file  . Alcohol Use: No  . Drug Use: No  . Sexual Activity: Not on file   Other Topics Concern  . Not on file   Social History Narrative  . No  narrative on file    Review of Systems: A 12 point ROS discussed and pertinent positives are indicated in the HPI above.  All other systems are negative.  Review of Systems    Vital Signs: BP 153/77  Pulse 68  Temp(Src) 98.4 F (36.9 C) (Oral)  Resp 16  Ht 5\' 10"  (1.778 m)  Wt 210 lb (95.255 kg)  BMI 30.13 kg/m2  SpO2 100%  Physical Exam  Constitutional: He is oriented to person, place, and time. He appears well-developed and well-nourished. No distress.  HENT:  Head: Normocephalic and atraumatic.  Neck: No tracheal deviation present.  Cardiovascular: Normal rate and regular rhythm.  Exam reveals no gallop and no friction rub.   No murmur heard. Pulmonary/Chest: Effort normal and breath sounds normal.  No respiratory distress. He has no wheezes. He has no rales.  Abdominal: Soft. Bowel sounds are normal. He exhibits no distension. There is no tenderness.  Neurological: He is alert and oriented to person, place, and time.  Skin: Skin is warm and dry. He is not diaphoretic.  Psychiatric: He has a normal mood and affect. His behavior is normal. Thought content normal.    Imaging: No results found.  Labs:  CBC:  Recent Labs  02/12/14 0930  WBC 7.1  HGB 13.2  HCT 39.2  PLT 252    COAGS:  Recent Labs  02/12/14 0930  INR 1.01  APTT 29    Assessment and Plan: History of high grade prostate cancer Right pelvic lymphadenopathy on CT done in Urology office Request for image guided right pelvic lymph node biopsy with moderate sedation Patient has been NPO, no blood thinners taken, labs reviewed Risks and Benefits discussed with the patient. All of the patient's questions were answered, patient is agreeable to proceed. Consent signed and in chart.    SignedHedy Jacob 02/12/2014, 10:39 AM

## 2014-02-12 NOTE — Discharge Instructions (Signed)
Conscious Sedation, Adult, Care After Refer to this sheet in the next few weeks. These instructions provide you with information on caring for yourself after your procedure. Your health care provider may also give you more specific instructions. Your treatment has been planned according to current medical practices, but problems sometimes occur. Call your health care provider if you have any problems or questions after your procedure. WHAT TO EXPECT AFTER THE PROCEDURE  After your procedure:  You may feel sleepy, clumsy, and have poor balance for several hours.  Vomiting may occur if you eat too soon after the procedure. HOME CARE INSTRUCTIONS  Do not participate in any activities where you could become injured for at least 24 hours. Do not:  Drive.  Swim.  Ride a bicycle.  Operate heavy machinery.  Cook.  Use power tools.  Climb ladders.  Work from a high place.  Do not make important decisions or sign legal documents until you are improved.  If you vomit, drink water, juice, or soup when you can drink without vomiting. Make sure you have little or no nausea before eating solid foods.  Only take over-the-counter or prescription medicines for pain, discomfort, or fever as directed by your health care provider.  Make sure you and your family fully understand everything about the medicines given to you, including what side effects may occur.  You should not drink alcohol, take sleeping pills, or take medicines that cause drowsiness for at least 24 hours.  If you smoke, do not smoke without supervision.  If you are feeling better, you may resume normal activities 24 hours after you were sedated.  Keep all appointments with your health care provider. SEEK MEDICAL CARE IF:  Your skin is pale or bluish in color.  You continue to feel nauseous or vomit.  Your pain is getting worse and is not helped by medicine.  You have bleeding or swelling.  You are still sleepy or  feeling clumsy after 24 hours. SEEK IMMEDIATE MEDICAL CARE IF:  You develop a rash.  You have difficulty breathing.  You develop any type of allergic problem.  You have a fever. MAKE SURE YOU:  Understand these instructions.  Will watch your condition.  Will get help right away if you are not doing well or get worse. Document Released: 02/04/2013 Document Reviewed: 02/04/2013 [facility] Patient Information 2015 [place], [place]. This information is not intended to replace advice given to you by your health care provider. Make sure you discuss any questions you have with your health care provider.  Remove dressing in 24 hours, after 1125, if site becomes red, lots of drainage call doctor.

## 2014-02-12 NOTE — Procedures (Signed)
CT core bx R pelvic LN 18g x3 to surg path No complication No blood loss. See complete dictation in [facility].

## 2014-02-17 ENCOUNTER — Encounter: Payer: Self-pay | Admitting: Radiation Oncology

## 2014-02-17 NOTE — Progress Notes (Signed)
GU Location of Tumor / Histology: Adenocarcinoma of the Prostate, Right and Left Lobes   Prostate Cancer, Gleason Score is (3 + 4)- 2 cores right mid lateral and left mid medial, 20%/10%)) and (4+3)- Left Mid lateral, Left apex lateral, 20%)  PSA is (9.93))  Matthew Schwartz was diagnosed with prostate cancer in September of 2013. He had biopsies in June 2005,April 2006, June 2007, and July 2008.  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 He has been on Avodart since 2008 and prior to that his PSA was 14.15. Last biopsy on 01/14/2012 with PSA of 4.22 and  began Active Surveillance.  Repeat Biopsy 11/2013 with 4/12 cores with Adenocarcinoma.  Prostatic Volume 28.15ml   Biopsies of Prostate :  December 23, 2013  Prostate Cancer,  Gleason Score is (3 + 4)- 2 cores right mid lateral and left mid medial,  (20% and 10% of cores respectively)  Gleason Score (4+3)- Left Mid lateral, Left apex lateral,  (20% of Cores Respectively)   PSA is (9.93))   Past/Anticipated interventions by urology, if any: Dr. Franchot Gallo -  Avodart since 2008. Multiple Prostate Biopsies with last on 12/03/13  Past/Anticipated interventions by medical oncology, if any: None at this time  Weight changes, if any: no  Bowel/Bladder complaints, if any: IPSS 3  Nausea/Vomiting, if any: no   Pain issues, if any: no  SAFETY ISSUES:  Prior radiation? NO  Pacemaker/ICD? NO  Possible current pregnancy? NA  Is the patient on methotrexate? 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

## 2014-02-18 ENCOUNTER — Ambulatory Visit
Admission: RE | Admit: 2014-02-18 | Discharge: 2014-02-18 | Disposition: A | Discharge status: Home / Self Care | Payer: Medicare Other | Source: Ambulatory Visit | Attending: Radiation Oncology | Admitting: Radiation Oncology

## 2014-02-18 ENCOUNTER — Encounter: Payer: Self-pay | Admitting: Radiation Oncology

## 2014-02-18 ENCOUNTER — Telehealth: Payer: Self-pay | Admitting: *Deleted

## 2014-02-18 VITALS — BP 142/69 | HR 67 | Temp 98.1°F | Resp 20 | Ht 70.0 in | Wt 217.0 lb

## 2014-02-18 DIAGNOSIS — C61 Malignant neoplasm of prostate: Secondary | ICD-10-CM | POA: Insufficient documentation

## 2014-02-18 DIAGNOSIS — Z510 Encounter for antineoplastic radiation therapy: Secondary | ICD-10-CM | POA: Insufficient documentation

## 2014-02-18 HISTORY — DX: Malignant neoplasm of prostate: C61

## 2014-02-18 NOTE — Progress Notes (Signed)
Please see the Nurse Progress Note in the MD Initial Consult Encounter for this patient. 

## 2014-02-18 NOTE — Telephone Encounter (Signed)
CALLED PATIENT TO INFORM OF APPT. WITH DR. DAHLSTEDT'S OFFICE ON 04-14-14 - ARRIVAL TIME - 1:30 PM AND HIS RETURN VISIT WITH DR. Valere Dross ON 04-27-14 - ARRIVAL TIME- 8:30 AM, SPOKE WITH PATIENT AND HE IS AWARE OF THESE APPTS.

## 2014-02-18 NOTE — Progress Notes (Signed)
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 male who is seen today through the courtesy of Dr. Diona Fanti for consideration of radiation therapy in the management of his stage T1c high-risk adenocarcinoma prostate. He has a lengthy urologic history with previous prostate biopsies in June of 2005, April 2006, June of 2007 and July 2008. 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 His biopsy on 01/14/2012 showed one focus of Gleason 6 (3+3) involving 40% of one core from the left mid gland. He is been on Avodart since 2008. Prior to initiation of Avodart his PSA was 14.15. A followup PSA in August 2009 3.59. Nadir PSA was 3.17 in November 2010. Since then his PSA has been slowly rising. Dr. Jasmine December obtained a prostate MRI scan on 12/26/2012. There were no suspicious areas within the peripheral zone. He was felt to have borderline enlarged right pelvic lymph nodes, felt to be reactive. He did undergo a CT-guided biopsy 02/12/2014 which was benign. His PSA rose to 9.98 on 12/23/2013. Ultrasound-guided prostate biopsies on 12/23/2013 revealed Gleason 7 (4+3) involving 20% of one core from the left lateral mid gland and 20% of one core from left lateral apex. He also had Gleason 7 (3+4) involving 20% of one core of the right lateral mid gland and 10% of one core from the left mid gland. His prostate volume was 20.8 cc. CT scans of the abdomen/pelvis and bone scan were without evidence for metastatic disease. He is doing reasonably well from a GU and GI standpoint. His I PSS score is 3. He does have erectile dysfunction and is not sexually active.  PREVIOUS  RADIATION THERAPY: No   PAST MEDICAL HISTORY:  has a past medical history of Diabetes mellitus without complication; Hypertension; Dyslipidemia; GERD (gastroesophageal reflux disease); PSA elevation; and Prostate cancer (12/23/13).     PAST SURGICAL HISTORY:  Past Surgical History  Procedure Laterality Date  . Replacement total knee  2008/2009    bilateral  . Angioplasty  2000  . Cataract surgery  2000  . Colonoscopy    . Prostate biopsy  12/23/13     FAMILY HISTORY: family history includes CVA in his mother; Coronary artery disease in his brother, brother, and sister; Heart disease in his brother, brother, and sister. His father died from "stress" at age 46. His mother died following a stroke at 55.   SOCIAL HISTORY:  reports that he quit smoking about 19 years ago. He does not have any smokeless tobacco history on file. He reports that he does not drink alcohol or use illicit drugs. Married, 2 children. 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   ALLERGIES: Losartan potassium   MEDICATIONS:  Current Outpatient Prescriptions  Medication Sig Dispense Refill  . AMLODIPINE BESYLATE PO Take 10 mg by mouth every morning.       Marland Kitchen aspirin 81 MG tablet Take 81 mg by mouth every morning.       Marland Kitchen atorvastatin (LIPITOR) 40 MG tablet Take 40 mg by mouth at bedtime.       . dutasteride (AVODART)  0.5 MG capsule Take 0.5 mg by mouth at bedtime.      . isosorbide mononitrate (IMDUR) 60 MG 24 hr tablet Take 60 mg by mouth every evening.      Marland Kitchen ketoconazole (NIZORAL) 2 % cream Apply 1 application topically daily as needed for irritation.      Marland Kitchen lactulose (CHRONULAC) 10 GM/15ML solution Take 10 g by mouth as needed for mild constipation.      . metFORMIN (GLUCOPHAGE-XR) 500 MG 24 hr tablet Take 500 mg by mouth 2 (two) times daily.      . metoprolol (LOPRESSOR) 50 MG tablet Take 50 mg by mouth 2 (two) times daily.      . nizatidine (AXID) 150 MG capsule Take  150 mg by mouth 2 (two) times daily as needed for heartburn.       . Polyethylene Glycol 400 (BLINK TEARS OP) Apply 1 drop to eye daily as needed (dry eyes.).      Marland Kitchen PROCTOSOL HC 2.5 % rectal cream Place 1 application rectally daily as needed for hemorrhoids or itching.       . ramipril (ALTACE) 10 MG capsule Take 10 mg by mouth 2 (two) times daily.        No current facility-administered medications for this encounter.     REVIEW OF SYSTEMS:  Pertinent items are noted in HPI.    PHYSICAL EXAM:  height is 5\' 10"  (1.778 m) and weight is 217 lb (98.431 kg). His oral temperature is 98.1 F (36.7 C). His blood pressure is 142/69 and his pulse is 67. His respiration is 20.   Alert and oriented [place] male appearing slightly younger than stated age. Head and neck examination: Grossly unremarkable. Nodes: Without palpable cervical or supraclavicular lymphadenopathy. Chest: Lungs clear. Abdomen: Without masses organomegaly. Back: Without spinal or CVA tenderness. Genitalia: Unremarkable to inspection. Rectal: I'm only able to palpate the inferior aspect of the prostate which is without focal induration or nodularity. Extremities: Without edema.   LABORATORY DATA:  Lab Results  Component Value Date   WBC 7.1 02/12/2014   HGB 13.2 02/12/2014   HCT 39.2 02/12/2014   MCV 80.5 02/12/2014   PLT 252 02/12/2014   Lab Results  Component Value Date   NA 136 08/09/2007   K 3.4* 08/09/2007   CL 106 08/09/2007   CO2 24 08/09/2007   Lab Results  Component Value Date   ALT 20 07/30/2007   AST 20 07/30/2007   ALKPHOS 54 07/30/2007   BILITOT 0.8 07/30/2007   PSA 9.98 (corrected approximately 20) from 12/23/2013.   IMPRESSION: StageT1c high-risk adenocarcinoma prostate. I explained to the patient and his wife that his prognosis is related to his stage, Gleason score, and PSA level. His stage is favorable while his Gleason score of 7 is of intermediate favorability and his corrected PSA of almost 20 is also of  intermediate favorability. Combined, he has high-risk disease. We discussed surgery versus continued active surveillance versus radiation therapy. Radiation therapy options include 8 weeks of external beam/IMRT or 5 weeks of external beam followed by a seed implant boost. He is leaning toward 8 weeks of external beam/IMRT. We also talked about androgen deprivation therapy which should be given for duration of 2 years if this is reasonably well tolerated. We discussed the potential acute and late toxicities of radiation therapy and also the side effects of androgen deprivation therapy. We also discussed a comfortably full bladder filling to minimize urinary toxicity during radiation therapy. We'll need  to have Dr. Diona Fanti place 3 gold seed markers for image guidance. I spoke with Dr. Alan Ripper nurse for initiation of androgen deprivation therapy. I will see the patient back for a followup visit in 2 months.  PLAN:  As discussed above.   I spent 60  minutes face to face with the patient and more than 50% of that time was spent in counseling and/or coordination of care.

## 2014-04-08 NOTE — Progress Notes (Signed)
GU Location of Tumor / Histology: Adenocarcinoma of the Prostate, Right and Left Lobes  Prostate Cancer, Gleason Score is (3 + 4)- 2 cores right mid lateral and left mid medial, 20%/10%)) and (4+3)- Left Mid lateral, Left apex lateral, 20%) PSA is (9.93))  Matthew Schwartz was diagnosed with prostate cancer in September of 2013. He had biopsies in June 2005,April 2006, June 2007, and July 2008. 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 He has been on Avodart since 2008 and prior to that his PSA was 14.15. Last biopsy on 01/14/2012 with PSA of 4.22 and began Active Surveillance. Repeat Biopsy 11/2013 with 4/12 cores with Adenocarcinoma. Prostatic Volume 28.58ml   Biopsies of Prostate :  December 23, 2013 Prostate Cancer,  Gleason Score is (3 + 4)- 2 cores right mid lateral and left mid medial,  (20% and 10% of cores respectively)  Gleason Score (4+3)- Left Mid lateral, Left apex lateral,  (20% of Cores Respectively)  PSA is (9.93))   Past/Anticipated interventions by urology, if any: Dr. Franchot Gallo - Avodart since 2008. Multiple Prostate Biopsies with last on 12/03/13  Past/Anticipated interventions by medical oncology, if any: None at this time  Weight changes, if any: no  Bowel/Bladder complaints, if any: IPSS 3  Nausea/Vomiting, if any: no   Pain issues, if any: no  SAFETY ISSUES:  Prior radiation? NO  Pacemaker/ICD? NO  Possible current pregnancy? NA  Is the patient on methotrexate? 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

## 2014-04-27 ENCOUNTER — Ambulatory Visit
Admission: RE | Admit: 2014-04-27 | Discharge: 2014-04-27 | Disposition: A | Discharge status: Home / Self Care | Payer: Medicare Other | Source: Ambulatory Visit | Attending: Radiation Oncology | Admitting: Radiation Oncology

## 2014-04-27 ENCOUNTER — Encounter: Payer: Self-pay | Admitting: Radiation Oncology

## 2014-04-27 VITALS — BP 138/63 | HR 67 | Temp 98.2°F | Resp 20 | Ht 70.0 in | Wt 211.7 lb

## 2014-04-27 DIAGNOSIS — Z510 Encounter for antineoplastic radiation therapy: Secondary | ICD-10-CM | POA: Insufficient documentation

## 2014-04-27 DIAGNOSIS — C61 Malignant neoplasm of prostate: Secondary | ICD-10-CM | POA: Diagnosis not present

## 2014-04-27 DIAGNOSIS — N529 Male erectile dysfunction, unspecified: Secondary | ICD-10-CM | POA: Insufficient documentation

## 2014-04-27 NOTE — Progress Notes (Signed)
Please see the Nurse Progress Note in the MD Initial Consult Encounter for this patient. 

## 2014-04-27 NOTE — Progress Notes (Addendum)
CC: Dr. [name], Dr. Franchot Gallo  Follow-up note:  Diagnosis: Stage TIc high-risk adenocarcinoma prostate  History: Matthew Schwartz is a pleasant 80 year old male who is seen today for review and further discussion of radiation therapy in the management of his stage TIc high-risk adenocarcinoma prostate.  I initially saw the patient in consultation on 02/18/2014. He has a lengthy urologic history with previous prostate biopsies in June of 2005, April 2006, June of 2007 and July 2008. 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 His biopsy on 01/14/2012 showed one focus of Gleason 6 (3+3) involving 40% of one core from the left mid gland. He is been on Avodart since 2008. Prior to initiation of Avodart his PSA was 14.15. A followup PSA in August 2009 3.59. Nadir PSA was 3.17 in November 2010. Since then his PSA has been slowly rising. Dr. Jasmine December obtained a prostate MRI scan on 12/26/2012. There were no suspicious areas within the peripheral zone. He was felt to have borderline enlarged right pelvic lymph nodes, felt to be reactive. He did undergo a CT-guided biopsy 02/12/2014 which was benign. His PSA rose to 9.98 on 12/23/2013. Ultrasound-guided prostate biopsies on 12/23/2013 revealed Gleason 7 (4+3) involving 20% of one core from the left lateral mid gland and 20% of one core from left lateral apex. He also had Gleason 7 (3+4) involving 20% of one core of the right lateral mid gland and 10% of one core from the left mid gland. His prostate volume was 20.8 cc. CT scans of the abdomen/pelvis and bone scan were without evidence for metastatic disease. He is doing reasonably well from a GU and GI standpoint.  He does have erectile dysfunction and is not sexually active.  He was started on androgen deprivation therapy in late October, and hip placement of 3 gold seed markers by Dr. Diona Fanti on 04/14/2014.  He is without new complaints today.  His previous I PSS score was 3.  Physical examination:  Alert and oriented. Filed Vitals:   04/27/14 0856  BP: 138/63  Pulse: 67  Temp: 98.2 F (36.8 C)  Resp: 20   Rectal examination not performed today.  Impression: Stage TIc high-risk adenocarcinoma prostate.  I explained to the patient was again his management options.  He elects 8 weeks of external beam/IMRT.  We discussed the potential acute and late toxicities of radiation therapy.  We discussed bladder filling to minimize urinary toxicity.  Consent is signed today.  Plan: I will have him return for CT simulation/treatment planning in mid January.  30 minutes was spent face-to-face with the patient, primarily counseling the patient and coordinating his care.

## 2014-05-10 ENCOUNTER — Ambulatory Visit
Admission: RE | Admit: 2014-05-10 | Discharge: 2014-05-10 | Disposition: A | Discharge status: Home / Self Care | Payer: Medicare Other | Source: Ambulatory Visit | Attending: Radiation Oncology | Admitting: Radiation Oncology

## 2014-05-10 DIAGNOSIS — Z510 Encounter for antineoplastic radiation therapy: Secondary | ICD-10-CM | POA: Diagnosis not present

## 2014-05-10 DIAGNOSIS — C61 Malignant neoplasm of prostate: Secondary | ICD-10-CM

## 2014-05-10 NOTE — Progress Notes (Signed)
Complex simulation/treatment planning note the patient was taken to the CT simulator.  A Vac lock immobilization device was constructed.  A red rubber tube was placed within the rectal vault.  He was then catheterized and contrast instilled into the bladder/urethra.  He was then scanned.  The CT data set was sent to the MIM planning system I contoured his prostate, seminal vesicles, bladder, and rectum.  I prescribing 7800 cGy in 40 sessions to his prostate PTV which represents the prostate was 0.8 cm except for 0.5 cm along the rectum.  I prescribing 5600 cGy in 40 sessions to his seminal vesicle PTV which represents the seminal vesicles +0.5 cm.  He is now ready for IMRT simulation/treatment planning.

## 2014-05-10 NOTE — Addendum Note (Signed)
Encounter addended by: Rexene Edison, MD on: 05/10/2014  7:44 AM<BR>     Documentation filed: Notes Section

## 2014-05-11 ENCOUNTER — Encounter: Payer: Self-pay | Admitting: Radiation Oncology

## 2014-05-11 DIAGNOSIS — Z510 Encounter for antineoplastic radiation therapy: Secondary | ICD-10-CM | POA: Diagnosis not present

## 2014-05-11 NOTE — Progress Notes (Signed)
IMRT simulation/treatment planning note: The patient completed IMRT simulation/treatment planning in the management of his carcinoma the prostate.  IMRT was chosen to decrease the risk for both acute and late bladder and rectal toxicity compared to 3-D conformal or conventional radiation therapy.  Dose volume histograms were obtained for the target structures including the prostate PTV which represents the prostate +0.8 cm except for 0.5 cm along the rectum.  We met our departmental goals.  Dose volume histograms were also obtained for the avoidance structures including the bladder, rectum, and femoral heads.  We also met our departmental goals.  I prescribing 7800 cGy in 40 sessions to his prostate PTV and 5600 cGy in 40 sessions to his seminal vesicle PTV which represents the seminal vesicles +0.5 cm.

## 2014-05-12 DIAGNOSIS — Z510 Encounter for antineoplastic radiation therapy: Secondary | ICD-10-CM | POA: Diagnosis not present

## 2014-05-19 ENCOUNTER — Ambulatory Visit
Admission: RE | Admit: 2014-05-19 | Discharge: 2014-05-19 | Disposition: A | Discharge status: Home / Self Care | Payer: Medicare Other | Source: Ambulatory Visit | Attending: Radiation Oncology | Admitting: Radiation Oncology

## 2014-05-19 DIAGNOSIS — C61 Malignant neoplasm of prostate: Secondary | ICD-10-CM

## 2014-05-19 DIAGNOSIS — Z510 Encounter for antineoplastic radiation therapy: Secondary | ICD-10-CM | POA: Diagnosis not present

## 2014-05-19 NOTE — Progress Notes (Signed)
Chart note: The patient began his VMAT IMRT today in the management of his carcinoma the prostate.  He is being treated with 2 sets of dynamic MLCs corresponding to one set of IMRT treatment devices [phone]).

## 2014-05-20 ENCOUNTER — Ambulatory Visit
Admission: RE | Admit: 2014-05-20 | Discharge: 2014-05-20 | Disposition: A | Discharge status: Home / Self Care | Payer: Medicare Other | Source: Ambulatory Visit | Attending: Radiation Oncology | Admitting: Radiation Oncology

## 2014-05-20 DIAGNOSIS — Z510 Encounter for antineoplastic radiation therapy: Secondary | ICD-10-CM | POA: Diagnosis not present

## 2014-05-21 ENCOUNTER — Ambulatory Visit: Payer: Medicare Other

## 2014-05-24 ENCOUNTER — Ambulatory Visit
Admission: RE | Admit: 2014-05-24 | Discharge: 2014-05-24 | Disposition: A | Discharge status: Home / Self Care | Payer: Medicare Other | Source: Ambulatory Visit | Attending: Radiation Oncology | Admitting: Radiation Oncology

## 2014-05-24 ENCOUNTER — Encounter: Payer: Self-pay | Admitting: Radiation Oncology

## 2014-05-24 VITALS — BP 142/69 | HR 63 | Temp 97.9°F | Resp 12 | Wt 217.0 lb

## 2014-05-24 DIAGNOSIS — C61 Malignant neoplasm of prostate: Secondary | ICD-10-CM

## 2014-05-24 DIAGNOSIS — Z510 Encounter for antineoplastic radiation therapy: Secondary | ICD-10-CM | POA: Diagnosis not present

## 2014-05-24 NOTE — Addendum Note (Signed)
Encounter addended by: Jenene Slicker, RN on: 05/24/2014 11:37 AM<BR>     Documentation filed: Notes Section

## 2014-05-24 NOTE — Progress Notes (Signed)
He is currently in no pain.  Pt reports urinary frequency, urgency and retention. Pt states they urinate 2 - 3 times per night.  Pt reports Constipation, a bowel movement every day, but reports stools are hard. Encouraged drinking plenty of water and prunes.   BP 142/69 mmHg  Pulse 63  Temp(Src) 97.9 F (36.6 C) (Oral)  Resp 12  Wt 217 lb (98.431 kg)  SpO2 100%

## 2014-05-24 NOTE — Progress Notes (Signed)
Pt here for patient teaching.  Radiation and You booklet given with areas of pertinence flagged and highlighted.  Reviewed diarrhea, fatigue, hair loss, nausea and vomiting, sexual and fertility changes, skin changes and urinary and bladder changes . Pt able to give teach back of use unscented/gentle soap, use baby wipes and drink plenty of water. Pt demonstrated understanding of information given and will contact nursing with any questions or concerns.

## 2014-05-24 NOTE — Progress Notes (Signed)
Weekly Management Note:  Site: Prostate Current Dose:  585  cGy Projected Dose: 7800  cGy  Narrative: The patient is seen today for routine under treatment assessment. CBCT/MVCT images/port films were reviewed. The chart was reviewed.   Bladder filling is satisfactory.  No new GU or GI difficulties.  Physical Examination:  Filed Vitals:   05/24/14 1052  BP: 142/69  Pulse: 63  Temp: 97.9 F (36.6 C)  Resp: 12  .  Weight: 217 lb (98.431 kg).  No change.  Impression: Tolerating radiation therapy well.  Plan: Continue radiation therapy as planned.

## 2014-05-25 ENCOUNTER — Ambulatory Visit
Admission: RE | Admit: 2014-05-25 | Discharge: 2014-05-25 | Disposition: A | Discharge status: Home / Self Care | Payer: Medicare Other | Source: Ambulatory Visit | Attending: Radiation Oncology | Admitting: Radiation Oncology

## 2014-05-25 DIAGNOSIS — Z510 Encounter for antineoplastic radiation therapy: Secondary | ICD-10-CM | POA: Diagnosis not present

## 2014-05-26 ENCOUNTER — Ambulatory Visit
Admission: RE | Admit: 2014-05-26 | Discharge: 2014-05-26 | Disposition: A | Discharge status: Home / Self Care | Payer: Medicare Other | Source: Ambulatory Visit | Attending: Radiation Oncology | Admitting: Radiation Oncology

## 2014-05-26 DIAGNOSIS — Z510 Encounter for antineoplastic radiation therapy: Secondary | ICD-10-CM | POA: Diagnosis not present

## 2014-05-27 ENCOUNTER — Ambulatory Visit
Admission: RE | Admit: 2014-05-27 | Discharge: 2014-05-27 | Disposition: A | Discharge status: Home / Self Care | Payer: Medicare Other | Source: Ambulatory Visit | Attending: Radiation Oncology | Admitting: Radiation Oncology

## 2014-05-27 DIAGNOSIS — Z510 Encounter for antineoplastic radiation therapy: Secondary | ICD-10-CM | POA: Diagnosis not present

## 2014-05-28 ENCOUNTER — Ambulatory Visit
Admission: RE | Admit: 2014-05-28 | Discharge: 2014-05-28 | Disposition: A | Discharge status: Home / Self Care | Payer: Medicare Other | Source: Ambulatory Visit | Attending: Radiation Oncology | Admitting: Radiation Oncology

## 2014-05-28 DIAGNOSIS — Z510 Encounter for antineoplastic radiation therapy: Secondary | ICD-10-CM | POA: Diagnosis not present

## 2014-05-31 ENCOUNTER — Ambulatory Visit
Admission: RE | Admit: 2014-05-31 | Discharge: 2014-05-31 | Disposition: A | Discharge status: Home / Self Care | Payer: Medicare Other | Source: Ambulatory Visit | Attending: Radiation Oncology | Admitting: Radiation Oncology

## 2014-05-31 ENCOUNTER — Encounter: Payer: Self-pay | Admitting: Radiation Oncology

## 2014-05-31 VITALS — BP 140/62 | HR 62 | Temp 97.9°F | Ht 70.0 in | Wt 218.4 lb

## 2014-05-31 DIAGNOSIS — Z510 Encounter for antineoplastic radiation therapy: Secondary | ICD-10-CM | POA: Diagnosis not present

## 2014-05-31 DIAGNOSIS — C61 Malignant neoplasm of prostate: Secondary | ICD-10-CM

## 2014-05-31 NOTE — Progress Notes (Signed)
Matthew Schwartz has received 8 fractions and he reports that his continues his norma of nocturia 2-3 times nightly and " a little" fatigue and takes short naps during the day.  He denies any dysuria, rectal irtitation, nor diarrhea.

## 2014-05-31 NOTE — Progress Notes (Signed)
Weekly Management Note:  Site: Prostate Current Dose:  1560  cGy Projected Dose: 7800  cGy  Narrative: The patient is seen today for routine under treatment assessment. CBCT/MVCT images/port films were reviewed. The chart was reviewed.   Bladder filling is satisfactory.  No new GU or GI difficulties.  Physical Examination:  Filed Vitals:   05/31/14 1045  BP: 140/62  Pulse: 62  Temp: 97.9 F (36.6 C)  .  Weight: 218 lb 6.4 oz (99.066 kg).  No change.  Impression: Tolerating radiation therapy well.  Plan: Continue radiation therapy as planned.

## 2014-06-01 ENCOUNTER — Ambulatory Visit
Admission: RE | Admit: 2014-06-01 | Discharge: 2014-06-01 | Disposition: A | Discharge status: Home / Self Care | Payer: Medicare Other | Source: Ambulatory Visit | Attending: Radiation Oncology | Admitting: Radiation Oncology

## 2014-06-01 DIAGNOSIS — Z510 Encounter for antineoplastic radiation therapy: Secondary | ICD-10-CM | POA: Diagnosis not present

## 2014-06-02 ENCOUNTER — Ambulatory Visit
Admission: RE | Admit: 2014-06-02 | Discharge: 2014-06-02 | Disposition: A | Discharge status: Home / Self Care | Payer: Medicare Other | Source: Ambulatory Visit | Attending: Radiation Oncology | Admitting: Radiation Oncology

## 2014-06-02 DIAGNOSIS — Z510 Encounter for antineoplastic radiation therapy: Secondary | ICD-10-CM | POA: Diagnosis not present

## 2014-06-03 ENCOUNTER — Ambulatory Visit
Admission: RE | Admit: 2014-06-03 | Discharge: 2014-06-03 | Disposition: A | Discharge status: Home / Self Care | Payer: Medicare Other | Source: Ambulatory Visit | Attending: Radiation Oncology | Admitting: Radiation Oncology

## 2014-06-03 DIAGNOSIS — Z510 Encounter for antineoplastic radiation therapy: Secondary | ICD-10-CM | POA: Diagnosis not present

## 2014-06-04 ENCOUNTER — Ambulatory Visit
Admission: RE | Admit: 2014-06-04 | Discharge: 2014-06-04 | Disposition: A | Discharge status: Home / Self Care | Payer: Medicare Other | Source: Ambulatory Visit | Attending: Radiation Oncology | Admitting: Radiation Oncology

## 2014-06-04 DIAGNOSIS — Z510 Encounter for antineoplastic radiation therapy: Secondary | ICD-10-CM | POA: Diagnosis not present

## 2014-06-07 ENCOUNTER — Ambulatory Visit
Admission: RE | Admit: 2014-06-07 | Discharge: 2014-06-07 | Disposition: A | Discharge status: Home / Self Care | Payer: Medicare Other | Source: Ambulatory Visit | Attending: Radiation Oncology | Admitting: Radiation Oncology

## 2014-06-07 ENCOUNTER — Encounter: Payer: Self-pay | Admitting: Radiation Oncology

## 2014-06-07 VITALS — BP 139/71 | HR 64 | Temp 98.0°F | Resp 12 | Wt 214.8 lb

## 2014-06-07 DIAGNOSIS — Z510 Encounter for antineoplastic radiation therapy: Secondary | ICD-10-CM | POA: Diagnosis not present

## 2014-06-07 DIAGNOSIS — C61 Malignant neoplasm of prostate: Secondary | ICD-10-CM

## 2014-06-07 NOTE — Progress Notes (Signed)
Weekly Management Note:  Site: Prostate Current Dose:  2535  cGy Projected Dose: 7800  cGy  Narrative: The patient is seen today for routine under treatment assessment. CBCT/MVCT images/port films were reviewed. The chart was reviewed.   Bladder filling is satisfactory.  No new GU or GI difficulties.  He believes that he is able to empty his bladder.  Urinary stream is fair.  Physical Examination:  Filed Vitals:   06/07/14 1053  BP: 139/71  Pulse: 64  Temp: 98 F (36.7 C)  Resp: 12  .  Weight: 214 lb 12.8 oz (97.433 kg).  No change.  Impression: Tolerating radiation therapy well.  Plan: Continue radiation therapy as planned.

## 2014-06-07 NOTE — Progress Notes (Signed)
He is currently in no pain. Pt complains of fatigue and loss of sleep Pt reports urinary frequency, urgency and retention. Pt states they urinate 2 - 3 times per night.  Pt reports Constipation, reports hard/firm stool, everyday.  Ongoing issue. BP 139/71 mmHg  Pulse 64  Temp(Src) 98 F (36.7 C) (Oral)  Resp 12  Wt 214 lb 12.8 oz (97.433 kg)  SpO2 100%

## 2014-06-08 ENCOUNTER — Ambulatory Visit
Admission: RE | Admit: 2014-06-08 | Discharge: 2014-06-08 | Disposition: A | Discharge status: Home / Self Care | Payer: Medicare Other | Source: Ambulatory Visit | Attending: Radiation Oncology | Admitting: Radiation Oncology

## 2014-06-08 DIAGNOSIS — Z510 Encounter for antineoplastic radiation therapy: Secondary | ICD-10-CM | POA: Diagnosis not present

## 2014-06-09 ENCOUNTER — Ambulatory Visit
Admission: RE | Admit: 2014-06-09 | Discharge: 2014-06-09 | Disposition: A | Discharge status: Home / Self Care | Payer: Medicare Other | Source: Ambulatory Visit | Attending: Radiation Oncology | Admitting: Radiation Oncology

## 2014-06-09 DIAGNOSIS — Z510 Encounter for antineoplastic radiation therapy: Secondary | ICD-10-CM | POA: Diagnosis not present

## 2014-06-10 ENCOUNTER — Ambulatory Visit
Admission: RE | Admit: 2014-06-10 | Discharge: 2014-06-10 | Disposition: A | Discharge status: Home / Self Care | Payer: Medicare Other | Source: Ambulatory Visit | Attending: Radiation Oncology | Admitting: Radiation Oncology

## 2014-06-10 DIAGNOSIS — Z510 Encounter for antineoplastic radiation therapy: Secondary | ICD-10-CM | POA: Diagnosis not present

## 2014-06-11 ENCOUNTER — Ambulatory Visit
Admission: RE | Admit: 2014-06-11 | Discharge: 2014-06-11 | Disposition: A | Discharge status: Home / Self Care | Payer: Medicare Other | Source: Ambulatory Visit | Attending: Radiation Oncology | Admitting: Radiation Oncology

## 2014-06-11 DIAGNOSIS — Z510 Encounter for antineoplastic radiation therapy: Secondary | ICD-10-CM | POA: Diagnosis not present

## 2014-06-14 ENCOUNTER — Ambulatory Visit: Payer: Medicare Other

## 2014-06-14 ENCOUNTER — Encounter: Payer: Self-pay | Admitting: Radiation Oncology

## 2014-06-15 ENCOUNTER — Ambulatory Visit
Admission: RE | Admit: 2014-06-15 | Discharge: 2014-06-15 | Disposition: A | Discharge status: Home / Self Care | Payer: Medicare Other | Source: Ambulatory Visit | Attending: Radiation Oncology | Admitting: Radiation Oncology

## 2014-06-15 DIAGNOSIS — Z510 Encounter for antineoplastic radiation therapy: Secondary | ICD-10-CM | POA: Diagnosis not present

## 2014-06-16 ENCOUNTER — Ambulatory Visit
Admission: RE | Admit: 2014-06-16 | Discharge: 2014-06-16 | Disposition: A | Discharge status: Home / Self Care | Payer: Medicare Other | Source: Ambulatory Visit | Attending: Radiation Oncology | Admitting: Radiation Oncology

## 2014-06-16 ENCOUNTER — Encounter: Payer: Self-pay | Admitting: Radiation Oncology

## 2014-06-16 VITALS — BP 144/64 | HR 67 | Temp 98.0°F | Resp 16 | Wt 213.6 lb

## 2014-06-16 DIAGNOSIS — Z510 Encounter for antineoplastic radiation therapy: Secondary | ICD-10-CM | POA: Diagnosis not present

## 2014-06-16 DIAGNOSIS — C61 Malignant neoplasm of prostate: Secondary | ICD-10-CM

## 2014-06-16 NOTE — Progress Notes (Signed)
Weight and vitals stable. Denies pain. Denies dysuria or hematuria. Denies diarrhea. Denies urgency, frequency or incontinence. Reports nocturia x 4. Denies fatigue.

## 2014-06-16 NOTE — Progress Notes (Signed)
Weekly Management Note:  Site: Prostate Current Dose:  3705   cGy Projected Dose: 7800  cGy  Narrative: The patient is seen today for routine under treatment assessment. CBCT/MVCT images/port films were reviewed. The chart was reviewed.   His bladder filling today is suboptimal but satisfactory.  No new GU or GI difficulty.  Physical Examination:  Filed Vitals:   06/16/14 1046  BP: 144/64  Pulse: 67  Temp: 98 F (36.7 C)  Resp: 16  .  Weight: 213 lb 9.6 oz (96.888 kg).  No change.  Impression: Tolerating radiation therapy well.  Plan: Continue radiation therapy as planned.

## 2014-06-17 ENCOUNTER — Ambulatory Visit
Admission: RE | Admit: 2014-06-17 | Discharge: 2014-06-17 | Disposition: A | Discharge status: Home / Self Care | Payer: Medicare Other | Source: Ambulatory Visit | Attending: Radiation Oncology | Admitting: Radiation Oncology

## 2014-06-17 DIAGNOSIS — Z510 Encounter for antineoplastic radiation therapy: Secondary | ICD-10-CM | POA: Diagnosis not present

## 2014-06-18 ENCOUNTER — Ambulatory Visit
Admission: RE | Admit: 2014-06-18 | Discharge: 2014-06-18 | Disposition: A | Discharge status: Home / Self Care | Payer: Medicare Other | Source: Ambulatory Visit | Attending: Radiation Oncology | Admitting: Radiation Oncology

## 2014-06-18 DIAGNOSIS — Z510 Encounter for antineoplastic radiation therapy: Secondary | ICD-10-CM | POA: Diagnosis not present

## 2014-06-21 ENCOUNTER — Encounter: Payer: Self-pay | Admitting: Radiation Oncology

## 2014-06-21 ENCOUNTER — Ambulatory Visit
Admission: RE | Admit: 2014-06-21 | Discharge: 2014-06-21 | Disposition: A | Discharge status: Home / Self Care | Payer: Medicare Other | Source: Ambulatory Visit | Attending: Radiation Oncology | Admitting: Radiation Oncology

## 2014-06-21 VITALS — BP 131/64 | HR 75 | Temp 97.7°F | Ht 70.0 in | Wt 215.6 lb

## 2014-06-21 DIAGNOSIS — Z510 Encounter for antineoplastic radiation therapy: Secondary | ICD-10-CM | POA: Diagnosis not present

## 2014-06-21 DIAGNOSIS — C61 Malignant neoplasm of prostate: Secondary | ICD-10-CM

## 2014-06-21 NOTE — Progress Notes (Signed)
Mr. Lariccia reports nocturia 3-4 times, and states that at night it can be difficult to start his stream.  He reports proctitis, but denies any loose or diarrheal stools. He reports "a little" fatigue.

## 2014-06-21 NOTE — Progress Notes (Signed)
Weekly Management Note:  Site: Prostate Current Dose:  4290  cGy Projected Dose: 7800  cGy  Narrative: The patient is seen today for routine under treatment assessment. CBCT/MVCT images/port films were reviewed. The chart was reviewed.   Bladder filling is satisfactory.  He does have mild proctitis.  His otherwise doing well.  Physical Examination:  Filed Vitals:   06/21/14 1055  BP: 131/64  Pulse: 75  Temp: 97.7 F (36.5 C)  .  Weight: 215 lb 9.6 oz (97.796 kg).  No change.  Impression: Tolerating radiation therapy well.  He may use Preparation H for his mild proctitis.  Plan: Continue radiation therapy as planned.

## 2014-06-22 ENCOUNTER — Ambulatory Visit
Admission: RE | Admit: 2014-06-22 | Discharge: 2014-06-22 | Disposition: A | Discharge status: Home / Self Care | Payer: Medicare Other | Source: Ambulatory Visit | Attending: Radiation Oncology | Admitting: Radiation Oncology

## 2014-06-22 DIAGNOSIS — Z510 Encounter for antineoplastic radiation therapy: Secondary | ICD-10-CM | POA: Diagnosis not present

## 2014-06-23 ENCOUNTER — Ambulatory Visit
Admission: RE | Admit: 2014-06-23 | Discharge: 2014-06-23 | Disposition: A | Discharge status: Home / Self Care | Payer: Medicare Other | Source: Ambulatory Visit | Attending: Radiation Oncology | Admitting: Radiation Oncology

## 2014-06-23 DIAGNOSIS — Z510 Encounter for antineoplastic radiation therapy: Secondary | ICD-10-CM | POA: Diagnosis not present

## 2014-06-24 ENCOUNTER — Ambulatory Visit
Admission: RE | Admit: 2014-06-24 | Discharge: 2014-06-24 | Disposition: A | Discharge status: Home / Self Care | Payer: Medicare Other | Source: Ambulatory Visit | Attending: Radiation Oncology | Admitting: Radiation Oncology

## 2014-06-24 DIAGNOSIS — Z510 Encounter for antineoplastic radiation therapy: Secondary | ICD-10-CM | POA: Diagnosis not present

## 2014-06-25 ENCOUNTER — Ambulatory Visit
Admission: RE | Admit: 2014-06-25 | Discharge: 2014-06-25 | Disposition: A | Discharge status: Home / Self Care | Payer: Medicare Other | Source: Ambulatory Visit | Attending: Radiation Oncology | Admitting: Radiation Oncology

## 2014-06-25 DIAGNOSIS — Z510 Encounter for antineoplastic radiation therapy: Secondary | ICD-10-CM | POA: Diagnosis not present

## 2014-06-28 ENCOUNTER — Encounter: Payer: Self-pay | Admitting: Radiation Oncology

## 2014-06-28 ENCOUNTER — Ambulatory Visit
Admission: RE | Admit: 2014-06-28 | Discharge: 2014-06-28 | Disposition: A | Discharge status: Home / Self Care | Payer: Medicare Other | Source: Ambulatory Visit | Attending: Radiation Oncology | Admitting: Radiation Oncology

## 2014-06-28 VITALS — BP 147/59 | HR 75 | Temp 97.8°F | Ht 70.0 in | Wt 218.1 lb

## 2014-06-28 DIAGNOSIS — Z510 Encounter for antineoplastic radiation therapy: Secondary | ICD-10-CM | POA: Diagnosis not present

## 2014-06-28 DIAGNOSIS — C61 Malignant neoplasm of prostate: Secondary | ICD-10-CM

## 2014-06-28 NOTE — Progress Notes (Signed)
Weekly Management Note:  Site: Prostate  Current Dose:  5265  cGy Projected Dose: 7800  cGy  Narrative: The patient is seen today for routine under treatment assessment. CBCT/MVCT images/port films were reviewed. The chart was reviewed.   Bladder filling is excellent.  No new GU or GI difficulties.  He sees Dr. Diona Fanti for a follow-up visit today.  Physical Examination:  Filed Vitals:   06/28/14 1045  BP: 147/59  Pulse: 75  Temp: 97.8 F (36.6 C)  .  Weight: 218 lb 1.6 oz (98.93 kg).  No change.  Impression: Tolerating radiation therapy well.  Plan: Continue radiation therapy as planned.

## 2014-06-29 ENCOUNTER — Ambulatory Visit
Admission: RE | Admit: 2014-06-29 | Discharge: 2014-06-29 | Disposition: A | Discharge status: Home / Self Care | Payer: Medicare Other | Source: Ambulatory Visit | Attending: Radiation Oncology | Admitting: Radiation Oncology

## 2014-06-29 DIAGNOSIS — Z510 Encounter for antineoplastic radiation therapy: Secondary | ICD-10-CM | POA: Diagnosis not present

## 2014-06-30 ENCOUNTER — Ambulatory Visit
Admission: RE | Admit: 2014-06-30 | Discharge: 2014-06-30 | Disposition: A | Discharge status: Home / Self Care | Payer: Medicare Other | Source: Ambulatory Visit | Attending: Radiation Oncology | Admitting: Radiation Oncology

## 2014-06-30 DIAGNOSIS — Z510 Encounter for antineoplastic radiation therapy: Secondary | ICD-10-CM | POA: Diagnosis not present

## 2014-07-01 ENCOUNTER — Ambulatory Visit
Admission: RE | Admit: 2014-07-01 | Discharge: 2014-07-01 | Disposition: A | Discharge status: Home / Self Care | Payer: Medicare Other | Source: Ambulatory Visit | Attending: Radiation Oncology | Admitting: Radiation Oncology

## 2014-07-01 DIAGNOSIS — Z510 Encounter for antineoplastic radiation therapy: Secondary | ICD-10-CM | POA: Diagnosis not present

## 2014-07-02 ENCOUNTER — Ambulatory Visit
Admission: RE | Admit: 2014-07-02 | Discharge: 2014-07-02 | Disposition: A | Discharge status: Home / Self Care | Payer: Medicare Other | Source: Ambulatory Visit | Attending: Radiation Oncology | Admitting: Radiation Oncology

## 2014-07-02 DIAGNOSIS — Z510 Encounter for antineoplastic radiation therapy: Secondary | ICD-10-CM | POA: Diagnosis not present

## 2014-07-05 ENCOUNTER — Encounter: Payer: Self-pay | Admitting: Radiation Oncology

## 2014-07-05 ENCOUNTER — Ambulatory Visit
Admission: RE | Admit: 2014-07-05 | Discharge: 2014-07-05 | Disposition: A | Discharge status: Home / Self Care | Payer: Medicare Other | Source: Ambulatory Visit | Attending: Radiation Oncology | Admitting: Radiation Oncology

## 2014-07-05 VITALS — BP 156/70 | HR 65 | Temp 97.5°F | Ht 70.0 in | Wt 216.5 lb

## 2014-07-05 DIAGNOSIS — C61 Malignant neoplasm of prostate: Secondary | ICD-10-CM

## 2014-07-05 NOTE — Progress Notes (Signed)
Weekly Management Note:  Site: Prostate Current Dose:  6240  cGy Projected Dose: 7800  cGy  Narrative: The patient is seen today for routine under treatment assessment. CBCT/MVCT images/port films were reviewed. The chart was reviewed.   Treatment is pending at the time this dictation.  He is having difficulty with constipation and pressure within the rectal area.  He generally takes lactulose twice a week, but taking it more often causes "too many" stools.  His bladder filling has been satisfactory.  He does report nocturia every 1-1/2 hours with intermittent mild dysuria.  Physical Examination:  Filed Vitals:   07/05/14 1015  BP: 156/70  Pulse: 65  Temp: 97.5 F (36.4 C)  .  Weight: 216 lb 8 oz (98.204 kg).  No change.  Impression: Tolerating radiation therapy well.  Plan: Continue radiation therapy as planned.

## 2014-07-05 NOTE — Progress Notes (Addendum)
Mr. Matthew Schwartz reports that he is voiding frequently at night, at every 1- 1 1/2 hours, with intermittent burning.  He denies any odor.  He reports pressure in the rectal area and he notes some difficulty in passing stool.  He states he only takes a stool softner twice a week, because taking them daily, resulting in "too many" stools.  Reports fatigue.

## 2014-07-06 ENCOUNTER — Ambulatory Visit
Admission: RE | Admit: 2014-07-06 | Discharge: 2014-07-06 | Disposition: A | Discharge status: Home / Self Care | Payer: Medicare Other | Source: Ambulatory Visit | Attending: Radiation Oncology | Admitting: Radiation Oncology

## 2014-07-06 DIAGNOSIS — Z510 Encounter for antineoplastic radiation therapy: Secondary | ICD-10-CM | POA: Diagnosis not present

## 2014-07-07 ENCOUNTER — Ambulatory Visit
Admission: RE | Admit: 2014-07-07 | Discharge: 2014-07-07 | Disposition: A | Discharge status: Home / Self Care | Payer: Medicare Other | Source: Ambulatory Visit | Attending: Radiation Oncology | Admitting: Radiation Oncology

## 2014-07-07 DIAGNOSIS — Z510 Encounter for antineoplastic radiation therapy: Secondary | ICD-10-CM | POA: Diagnosis not present

## 2014-07-08 ENCOUNTER — Ambulatory Visit
Admission: RE | Admit: 2014-07-08 | Discharge: 2014-07-08 | Disposition: A | Discharge status: Home / Self Care | Payer: Medicare Other | Source: Ambulatory Visit | Attending: Radiation Oncology | Admitting: Radiation Oncology

## 2014-07-08 DIAGNOSIS — Z510 Encounter for antineoplastic radiation therapy: Secondary | ICD-10-CM | POA: Diagnosis not present

## 2014-07-09 ENCOUNTER — Ambulatory Visit
Admission: RE | Admit: 2014-07-09 | Discharge: 2014-07-09 | Disposition: A | Discharge status: Home / Self Care | Payer: Medicare Other | Source: Ambulatory Visit | Attending: Radiation Oncology | Admitting: Radiation Oncology

## 2014-07-09 DIAGNOSIS — Z510 Encounter for antineoplastic radiation therapy: Secondary | ICD-10-CM | POA: Diagnosis not present

## 2014-07-12 ENCOUNTER — Encounter: Payer: Self-pay | Admitting: Radiation Oncology

## 2014-07-12 ENCOUNTER — Ambulatory Visit
Admission: RE | Admit: 2014-07-12 | Discharge: 2014-07-12 | Disposition: A | Discharge status: Home / Self Care | Payer: Medicare Other | Source: Ambulatory Visit | Attending: Radiation Oncology | Admitting: Radiation Oncology

## 2014-07-12 DIAGNOSIS — Z510 Encounter for antineoplastic radiation therapy: Secondary | ICD-10-CM | POA: Diagnosis not present

## 2014-07-13 ENCOUNTER — Encounter: Payer: Self-pay | Admitting: Radiation Oncology

## 2014-07-13 ENCOUNTER — Ambulatory Visit: Payer: Medicare Other

## 2014-07-13 ENCOUNTER — Ambulatory Visit
Admission: RE | Admit: 2014-07-13 | Discharge: 2014-07-13 | Disposition: A | Discharge status: Home / Self Care | Payer: Medicare Other | Source: Ambulatory Visit | Attending: Radiation Oncology | Admitting: Radiation Oncology

## 2014-07-13 VITALS — BP 138/75 | HR 68 | Temp 97.7°F | Resp 20 | Wt 217.0 lb

## 2014-07-13 DIAGNOSIS — Z510 Encounter for antineoplastic radiation therapy: Secondary | ICD-10-CM | POA: Diagnosis not present

## 2014-07-13 DIAGNOSIS — C61 Malignant neoplasm of prostate: Secondary | ICD-10-CM

## 2014-07-13 NOTE — Progress Notes (Signed)
Weekly Management Note:  Site: Prostate Current Dose:  7215  cGy Projected Dose: 7800  cGy  Narrative: The patient is seen today for routine under treatment assessment. CBCT/MVCT images/port films were reviewed. The chart was reviewed.   Bladder filling is satisfactory.  No new GU or GI difficulties.  Physical Examination:  Filed Vitals:   07/13/14 1041  BP: 138/75  Pulse: 68  Temp: 97.7 F (36.5 C)  Resp: 20  .  Weight: 217 lb (98.431 kg).  No change.  Impression: Tolerating radiation therapy well.  He will finish his radiation therapy this Friday.  Plan: Continue radiation therapy as planned.  One-month follow-up visit after completion of radiation therapy.

## 2014-07-13 NOTE — Progress Notes (Signed)
Weekly rad txs 37/40 nocturia 5x this past week, is fatigued, no hematuria, does have mild dysuria, pressure with bowel movements 2x day formed stool stated, does have gas in abdomen, 1 month follow appt,  card given, appetite good 10:46 AM

## 2014-07-14 ENCOUNTER — Ambulatory Visit
Admission: RE | Admit: 2014-07-14 | Discharge: 2014-07-14 | Disposition: A | Discharge status: Home / Self Care | Payer: Medicare Other | Source: Ambulatory Visit | Attending: Radiation Oncology | Admitting: Radiation Oncology

## 2014-07-14 ENCOUNTER — Ambulatory Visit: Payer: Medicare Other

## 2014-07-14 DIAGNOSIS — Z510 Encounter for antineoplastic radiation therapy: Secondary | ICD-10-CM | POA: Diagnosis not present

## 2014-07-15 ENCOUNTER — Ambulatory Visit: Payer: Medicare Other

## 2014-07-15 ENCOUNTER — Ambulatory Visit
Admission: RE | Admit: 2014-07-15 | Discharge: 2014-07-15 | Disposition: A | Discharge status: Home / Self Care | Payer: Medicare Other | Source: Ambulatory Visit | Attending: Radiation Oncology | Admitting: Radiation Oncology

## 2014-07-15 DIAGNOSIS — Z510 Encounter for antineoplastic radiation therapy: Secondary | ICD-10-CM | POA: Diagnosis not present

## 2014-07-16 ENCOUNTER — Ambulatory Visit
Admission: RE | Admit: 2014-07-16 | Discharge: 2014-07-16 | Disposition: A | Discharge status: Home / Self Care | Payer: Medicare Other | Source: Ambulatory Visit | Attending: Radiation Oncology | Admitting: Radiation Oncology

## 2014-07-16 DIAGNOSIS — Z510 Encounter for antineoplastic radiation therapy: Secondary | ICD-10-CM | POA: Diagnosis not present

## 2014-07-18 ENCOUNTER — Encounter: Payer: Self-pay | Admitting: Radiation Oncology

## 2014-07-18 NOTE — Progress Notes (Signed)
[facility] End of Treatment Note  Name:Matthew Schwartz  Date: 07/18/2014 JSE:[id number] DOB:08-18-1932   Status:outpatient    CC: Gennette Pac, MD  Dr. Franchot Gallo  REFERRING PHYSICIAN:  Dr. Franchot Gallo   DIAGNOSIS: StageT1c high risk adenocarcinoma prostate    INDICATION FOR TREATMENT: Curative   TREATMENT DATES: 05/18/2014 through 07/16/2014                          SITE/DOSE:  Prostate 7800 cGy in 40 sessions, seminal vesicles 5600 cGy in 40 sessions                          BEAMS/ENERGY:   Dual ARC VMAT IMRT with 6 MV photons                NARRATIVE:   The patient tolerated his treatment well with no significant GU or GI toxicity by completion of therapy.  He continues with his androgen deprivation therapy under the guidance and direction of Dr. Diona Fanti.                         PLAN: Routine followup in one month. Patient instructed to call if questions or worsening complaints in interim.

## 2014-08-11 ENCOUNTER — Ambulatory Visit: Payer: Medicare Other | Admitting: Radiation Oncology

## 2014-08-17 ENCOUNTER — Encounter: Payer: Self-pay | Admitting: Radiation Oncology

## 2014-08-18 ENCOUNTER — Encounter: Payer: Self-pay | Admitting: Radiation Oncology

## 2014-08-18 ENCOUNTER — Ambulatory Visit
Admission: RE | Admit: 2014-08-18 | Discharge: 2014-08-18 | Disposition: A | Discharge status: Home / Self Care | Payer: Medicare Other | Source: Ambulatory Visit | Attending: Radiation Oncology | Admitting: Radiation Oncology

## 2014-08-18 VITALS — BP 132/69 | HR 74 | Temp 97.7°F | Resp 20 | Ht 70.0 in | Wt 211.7 lb

## 2014-08-18 DIAGNOSIS — C61 Malignant neoplasm of prostate: Secondary | ICD-10-CM

## 2014-08-18 HISTORY — DX: Personal history of irradiation: Z92.3

## 2014-08-18 NOTE — Progress Notes (Signed)
CC: Dr. Franchot Gallo  Follow-up note:  Mr. Dudzinski visits today approximately 1 month following completion of external beam/IMRT along with androgen deprivation therapy in the management of his stage TIc high-risk adenocarcinoma prostate.  He is really doing well from a GU and GI standpoint.  He is almost back to his baseline habits.  He is having less fatigue and he denies hot flashes despite his ongoing androgen deprivation therapy.  He will see Dr. Diona Fanti for a follow-up visit in June.  Physical examination: Alert and oriented. Filed Vitals:   08/18/14 1010  BP: 132/69  Pulse: 74  Temp: 97.7 F (36.5 C)  Resp: 20   Rectal examination not performed today.  Impression: Satisfactory progress.  He is almost completely returned to his baseline urinary and bowel habits.  He'll continue with his androgen deprivation therapy under the direction of Dr. Diona Fanti.  Ideally this should be continued for a total of 2 years.  Plan: As above.  I've not scheduled the patient for a formal follow-up visit and I ask that Dr. Diona Fanti keep me posted on his progress.

## 2014-08-18 NOTE — Progress Notes (Signed)
Matthew Schwartz here for reassessment s/p RT to his pelvis for prostate cancer.  He denies any dysuria, has intermittent dribbling, urgency and incomplete emptying. He also reports decrease in rectal irritation and less fatigue since end of treatment.

## 2015-02-07 ENCOUNTER — Ambulatory Visit: Payer: Medicare Other | Admitting: Cardiology

## 2015-02-15 ENCOUNTER — Telehealth: Payer: Self-pay | Admitting: Oncology

## 2015-02-15 NOTE — Telephone Encounter (Signed)
NEW PATIENT APPT-S/W PATIENT AND GAVE NP APPT FOR 11/02 @ 1:30 W/DR. SHADAD REFERRING DR. Anson Fret DX- ANEMIA   REFERRAL INFORMATION SCANNED

## 2015-02-16 ENCOUNTER — Ambulatory Visit (INDEPENDENT_AMBULATORY_CARE_PROVIDER_SITE_OTHER): Payer: Medicare Other | Admitting: Cardiology

## 2015-02-16 ENCOUNTER — Encounter: Payer: Self-pay | Admitting: Cardiology

## 2015-02-16 VITALS — BP 146/68 | HR 80 | Ht 70.0 in | Wt 211.1 lb

## 2015-02-16 DIAGNOSIS — I2510 Atherosclerotic heart disease of native coronary artery without angina pectoris: Secondary | ICD-10-CM | POA: Diagnosis not present

## 2015-02-16 DIAGNOSIS — I10 Essential (primary) hypertension: Secondary | ICD-10-CM | POA: Diagnosis not present

## 2015-02-16 DIAGNOSIS — I2583 Coronary atherosclerosis due to lipid rich plaque: Principal | ICD-10-CM

## 2015-02-16 DIAGNOSIS — E785 Hyperlipidemia, unspecified: Secondary | ICD-10-CM

## 2015-02-16 DIAGNOSIS — M79604 Pain in right leg: Secondary | ICD-10-CM

## 2015-02-16 DIAGNOSIS — M79605 Pain in left leg: Secondary | ICD-10-CM

## 2015-02-16 NOTE — Progress Notes (Addendum)
[facility]. [address], [address], [place]  [address] Phone: [phone] Fax:  [phone]  Date:  02/16/2015   ID:  Matthew Schwartz, DOB [birth year], MRN [id number]  PCP:  Gennette Pac, MD   History of Present Illness: Matthew Schwartz is a 81 y.o. male with coronary artery disease PTCA (no stent) to RCA, diabetes, hypertension, hyperlipidemia here for followup last stress test in 2007 showed mild to moderate ischemia in the inferoapical/apical lateral region. EF was 59%.   He has been doing very well with no exertional angina, no shortness of breath. He has a strictly vegetarian diet. He lives periodically with his children. He stated he had no income.  He told me about a story where he was concerned about the financial aspects of his heart disease when he was first diagnosed and Dr. Leonia Reeves was very kind regarding this matter. [name]   he has noticed some leg discomfort and was wondering if this could be secondary to vascular/circulatory issues. When he sits at times, he has more pain in his feet. He does not seem to be limited with walking through the grocery for instance. Nonetheless, he does have risk factors for peripheral vascular disease. I was having trouble palpating his distal pulses today.     Wt Readings from Last 3 Encounters:  02/16/15 211 lb 1.9 oz (95.763 kg)  08/18/14 211 lb 11.2 oz (96.026 kg)  07/13/14 217 lb (98.431 kg)     Past Medical History  Diagnosis Date  . Diabetes mellitus without complication ([place])   . Hypertension   . Dyslipidemia   . GERD (gastroesophageal reflux disease)   . PSA elevation   . Prostate cancer ([place]) 12/23/13    Adenocarcinoma  . S/P radiation therapy 05/18/2014 through 07/16/2014     Prostate 7800 cGy in 40 sessions, seminal vesicles 5600 cGy in 40 sessions     Past Surgical History  Procedure Laterality Date  .  Replacement total knee  2008/2009    bilateral  . Angioplasty  2000  . Cataract surgery  2000  . Colonoscopy    . Prostate biopsy  12/23/13    Current Outpatient Prescriptions  Medication Sig Dispense Refill  . amLODipine (NORVASC) 10 MG tablet Take 10 mg by mouth daily.  1  . aspirin 81 MG tablet Take 81 mg by mouth every morning.     Marland Kitchen atorvastatin (LIPITOR) 40 MG tablet Take 40 mg by mouth at bedtime.     . dutasteride (AVODART) 0.5 MG capsule Take 0.5 mg by mouth at bedtime.    . finasteride (PROSCAR) 5 MG tablet TAKE 1 TABLET DAILY AS DIRECTED.  3  . isosorbide mononitrate (IMDUR) 60 MG 24 hr tablet Take 60 mg by mouth every evening.    Marland Kitchen ketoconazole (NIZORAL) 2 % cream Apply 1 application topically daily as needed for irritation.    Marland Kitchen lactulose (CHRONULAC) 10 GM/15ML solution Take 10 g by mouth as needed for mild constipation.    . metFORMIN (GLUCOPHAGE-XR) 500 MG 24 hr tablet Take 500 mg by mouth 2 (two) times daily.    . metoprolol succinate (TOPROL-XL) 50 MG 24 hr tablet Take 50 mg by mouth 2 (two) times daily.  1  . nizatidine (AXID) 150 MG capsule Take 150 mg by mouth 2 (two) times daily as needed for heartburn.     . Polyethylene Glycol 400 (BLINK TEARS OP) Apply 1 drop to eye daily as  needed (dry eyes.).    Marland Kitchen PROCTOSOL HC 2.5 % rectal cream Place 1 application rectally daily as needed for hemorrhoids or itching.     . ramipril (ALTACE) 10 MG capsule Take 10 mg by mouth 2 (two) times daily.      No current facility-administered medications for this visit.    Allergies:    Allergies  Allergen Reactions  . Losartan Potassium     Unknown reaction    Social History:  The patient  reports that he quit smoking about 20 years ago. He does not have any smokeless tobacco history on file. He reports that he does not drink alcohol or use illicit drugs.   Family History  Problem Relation Age of Onset  . CVA Mother   . Heart disease Sister   . Coronary artery disease Sister     . Heart disease Brother   . Coronary artery disease Brother   . Heart disease Brother   . Coronary artery disease Brother     ROS:  Please see the history of present illness.   No fevers, no chills, no orthopnea, no syncope, no PND  All other systems reviewed and negative.   PHYSICAL EXAM: VS:  BP 146/68 mmHg  Pulse 80  Ht 5\' 10"  (1.778 m)  Wt 211 lb 1.9 oz (95.763 kg)  BMI 30.29 kg/m2 Well nourished, well developed, in no acute distress HEENT: normal, [place]/AT, EOMI Neck: no JVD, normal carotid upstroke, no bruit Cardiac:  normal S1, S2; RRR; no murmur Lungs:  clear to auscultation bilaterally, no wheezing, rhonchi or rales Abd: soft, nontender, no hepatomegaly, no bruits Ext:  trace edema,  Difficult to palpate distal pulses Skin: warm and dry GU: deferred Neuro: no focal abnormalities noted, AAO x 3  EKG:  Today 02/16/15 - NSR 72 personally viewed , prior8/28/15-sinus rhythm, LVH, nonspecific ST-T wave changes Labs: LDL 78  ASSESSMENT AND PLAN:  1. Coronary artery disease-currently stable, doing well, no anginal symptoms. Aggressive secondary prevention. Current antianginal medications reviewed. We will continue. He appreciates with Dr. Leonia Reeves did for him. 2. Hyperlipidemia-LDL goal less than 70. Atorvastatin. LDL  In the past 78. 3. Hypertension-well-controlled. Medications reviewed. 4.  leg pain- with his diabetes, hypertension and prior coronary artery disease, I will go ahead and check lower extremity arterial Dopplers to see if there is any evidence of peripheral vascular disease present. Today, had trouble palpating his distal pulses. 5. One-year followup  Signed, Candee Furbish, MD [facility]  02/16/2015 12:08 PM

## 2015-02-16 NOTE — Patient Instructions (Signed)
Medication Instructions:  None  Labwork: None  Testing/Procedures: Your physician has requested that you have a lower extremity arterial duplex. This test is an ultrasound of the arteries in the legs or arms. It looks at arterial blood flow in the legs and arms. Allow one hour for Lower and Upper Arterial scans. There are no restrictions or special instructions   Follow-Up: Your physician wants you to follow-up in: 1 year with Dr. Marlou Porch.  You will receive a reminder letter in the mail two months in advance. If you don't receive a letter, please call our office to schedule the follow-up appointment.   Any Other Special Instructions Will Be Listed Below (If Applicable).

## 2015-02-17 ENCOUNTER — Other Ambulatory Visit: Payer: Self-pay | Admitting: Cardiology

## 2015-02-17 DIAGNOSIS — M79605 Pain in left leg: Principal | ICD-10-CM

## 2015-02-17 DIAGNOSIS — M79604 Pain in right leg: Secondary | ICD-10-CM

## 2015-02-28 ENCOUNTER — Ambulatory Visit (HOSPITAL_COMMUNITY)
Admission: RE | Admit: 2015-02-28 | Discharge: 2015-02-28 | Disposition: A | Discharge status: Home / Self Care | Payer: Medicare Other | Source: Ambulatory Visit | Attending: Cardiology | Admitting: Cardiology

## 2015-02-28 DIAGNOSIS — E119 Type 2 diabetes mellitus without complications: Secondary | ICD-10-CM | POA: Insufficient documentation

## 2015-02-28 DIAGNOSIS — M79606 Pain in leg, unspecified: Secondary | ICD-10-CM | POA: Diagnosis present

## 2015-02-28 DIAGNOSIS — E785 Hyperlipidemia, unspecified: Secondary | ICD-10-CM | POA: Diagnosis not present

## 2015-02-28 DIAGNOSIS — I10 Essential (primary) hypertension: Secondary | ICD-10-CM | POA: Diagnosis not present

## 2015-02-28 DIAGNOSIS — M79605 Pain in left leg: Secondary | ICD-10-CM | POA: Diagnosis not present

## 2015-02-28 DIAGNOSIS — M79604 Pain in right leg: Secondary | ICD-10-CM | POA: Diagnosis not present

## 2015-03-02 ENCOUNTER — Telehealth: Payer: Self-pay | Admitting: Oncology

## 2015-03-02 ENCOUNTER — Ambulatory Visit (HOSPITAL_BASED_OUTPATIENT_CLINIC_OR_DEPARTMENT_OTHER): Payer: Medicare Other | Admitting: Oncology

## 2015-03-02 VITALS — BP 134/58 | HR 72 | Temp 98.2°F | Resp 18 | Ht 70.0 in | Wt 216.5 lb

## 2015-03-02 DIAGNOSIS — C61 Malignant neoplasm of prostate: Secondary | ICD-10-CM | POA: Diagnosis not present

## 2015-03-02 DIAGNOSIS — D508 Other iron deficiency anemias: Secondary | ICD-10-CM

## 2015-03-02 DIAGNOSIS — D649 Anemia, unspecified: Secondary | ICD-10-CM | POA: Diagnosis not present

## 2015-03-02 NOTE — Telephone Encounter (Signed)
per pof to sch pt appt-gave pt copy of avs °

## 2015-03-02 NOTE — Progress Notes (Signed)
Please see consult note.  

## 2015-03-02 NOTE — Consult Note (Signed)
Reason for Referral: Anemia.   HPI: This is a pleasant [name] but currently lives in this area for the last 30 years. He gentleman with history of diabetes, hypertension and was diagnosed with prostate cancer in August 2015. He had Gleason score 4+3 = 7 and a PSA of 9.98. He was treated with external beam radiation therapy under the care of Dr. Valere Dross concluding in March 2016. He received a total of 7800 cGy in 40 fractions and 5600 cGy seminal vesicles. In October 2015 his hemoglobin was around 13.2 and most recently noted to have slight decline with hemoglobin in July 2016 was down to 11.1 and in 02/04/2015 his hemoglobin was 11.2 with a white cell count of 7.1 and a platelet count of 296. His MCV was 80.2. His Hemoccult testing was negative. His ferritin levels 108 which was normal. His vitamin T-61 and folic acid are all within normal range. His creatinine was 0.72 with a normal GFR. Patient referred to me for evaluation of mild chronic anemia. He was evaluated by gastroenterology and felt that no colonoscopy or endoscopy is needed. Clinically he does report some fatigue and tiredness but for the most part asymptomatic. He is able to drive and attends to activities of daily living. He has not reported any hematochezia or melena. Has not reported any hematuria or dysuria.  He does not report any headaches, blurry vision, syncope or seizures. He does not report any fevers or chills or sweats. Does not report any cough or hemoptysis or hematemesis. Does not report any nausea, vomiting or abdominal pain. Does not report any diarrhea but certainly he reports constipation. Does not report any hematuria, dysuria or incontinence. He does not report any skeletal complaints. He does report chronic knee pain that have been replaced. Remaining review of systems unremarkable.   Past Medical History  Diagnosis Date  . Diabetes mellitus without complication ([place])   . Hypertension   .  Dyslipidemia   . GERD (gastroesophageal reflux disease)   . PSA elevation   . Prostate cancer ([place]) 12/23/13    Adenocarcinoma  . S/P radiation therapy 05/18/2014 through 07/16/2014     Prostate 7800 cGy in 40 sessions, seminal vesicles 5600 cGy in 40 sessions   :  Past Surgical History  Procedure Laterality Date  . Replacement total knee  2008/2009    bilateral  . Angioplasty  2000  . Cataract surgery  2000  . Colonoscopy    . Prostate biopsy  12/23/13  :   Current outpatient prescriptions:  .  amLODipine (NORVASC) 10 MG tablet, Take 10 mg by mouth daily., Disp: , Rfl: 1 .  aspirin 81 MG tablet, Take 81 mg by mouth every morning. , Disp: , Rfl:  .  atorvastatin (LIPITOR) 40 MG tablet, Take 40 mg by mouth at bedtime. , Disp: , Rfl:  .  dutasteride (AVODART) 0.5 MG capsule, Take 0.5 mg by mouth at bedtime., Disp: , Rfl:  .  finasteride (PROSCAR) 5 MG tablet, TAKE 1 TABLET DAILY AS DIRECTED., Disp: , Rfl: 3 .  hydrocortisone valerate ointment (WEST-CORT) 0.2 %, Apply 1 application topically 2 (two) times daily., Disp: , Rfl: 2 .  isosorbide mononitrate (IMDUR) 60 MG 24 hr tablet, Take 60 mg by mouth every evening., Disp: , Rfl:  .  ketoconazole (NIZORAL) 2 % cream, Apply 1 application topically daily as needed for irritation., Disp: , Rfl:  .  lactulose (CHRONULAC) 10 GM/15ML solution, Take 10 g by mouth as  needed for mild constipation., Disp: , Rfl:  .  metFORMIN (GLUCOPHAGE-XR) 500 MG 24 hr tablet, Take 500 mg by mouth 2 (two) times daily., Disp: , Rfl:  .  metoprolol succinate (TOPROL-XL) 50 MG 24 hr tablet, Take 50 mg by mouth 2 (two) times daily., Disp: , Rfl: 1 .  nizatidine (AXID) 150 MG capsule, Take 150 mg by mouth 2 (two) times daily as needed for heartburn. , Disp: , Rfl:  .  Polyethylene Glycol 400 (BLINK TEARS OP), Apply 1 drop to eye daily as needed (dry eyes.)., Disp: , Rfl:  .  PROCTOSOL HC 2.5 %  rectal cream, Place 1 application rectally daily as needed for hemorrhoids or itching. , Disp: , Rfl:  .  ramipril (ALTACE) 10 MG capsule, Take 10 mg by mouth 2 (two) times daily. , Disp: , Rfl: :  Allergies  Allergen Reactions  . Losartan Potassium Other (See Comments)    Unknown reaction that happened 15 years ago  :  Family History  Problem Relation Age of Onset  . CVA Mother   . Heart disease Sister   . Coronary artery disease Sister   . Heart disease Brother   . Coronary artery disease Brother   . Heart disease Brother   . Coronary artery disease Brother   :  Social History   Social History  . Marital Status: Married    Spouse Name: N/A  . Number of Children: N/A  . Years of Education: N/A   Occupational History  . retired    Social History Main Topics  . Smoking status: Former Smoker    Quit date: 06/14/1994  . Smokeless tobacco: Not on file  . Alcohol Use: No  . Drug Use: No  . Sexual Activity: Not on file   Other Topics Concern  . Not on file   Social History Narrative  :  Pertinent items are noted in HPI.  Exam: Blood pressure 134/58, pulse 72, temperature 98.2 F (36.8 C), temperature source Oral, resp. rate 18, height _0  (1.778 m), weight 216 lb 8 oz (98.204 kg), SpO2 100 %. General appearance: alert and cooperative Head: Normocephalic, without obvious abnormality Nose: Nares normal. Septum midline. Mucosa normal. No drainage or sinus tenderness. Throat: lips, mucosa, and tongue normal; teeth and gums normal Neck: no adenopathy Back: negative Resp: clear to auscultation bilaterally Chest wall: no tenderness Cardio: regular rate and rhythm, S1, S2 normal, no murmur, click, rub or gallop GI: soft, non-tender; bowel sounds normal; no masses,  no organomegaly Extremities: extremities normal, atraumatic, no cyanosis or edema Pulses: 2+ and symmetric Skin: Skin color, texture, turgor normal. No rashes or lesions Lymph nodes: Cervical,  supraclavicular, and axillary nodes normal.  CBC    Component Value Date/Time   WBC 7.1 02/12/2014 0930   RBC 4.87 02/12/2014 0930   HGB 13.2 02/12/2014 0930   HCT 39.2 02/12/2014 0930   PLT 252 02/12/2014 0930   MCV 80.5 02/12/2014 0930   MCH 27.1 02/12/2014 0930   MCHC 33.7 02/12/2014 0930   RDW 15.3 02/12/2014 0930      Chemistry      Component Value Date/Time   NA 136 08/09/2007 0421   K 3.4* 08/09/2007 0421   CL 106 08/09/2007 0421   CO2 24 08/09/2007 0421   BUN 10 08/09/2007 0421   CREATININE 0.81 12/26/2012 1220      Component Value Date/Time   CALCIUM 8.1* 08/09/2007 0421   ALKPHOS 54 07/30/2007 1050   AST 20  07/30/2007 1050   ALT 20 07/30/2007 1050   BILITOT 0.8 07/30/2007 1050       Assessment and Plan:   81 year old gentleman with the following issues:  1. Normocytic, normochromic anemia dating back to 2013 with hemoglobin levels ranging between 11-13. He has a normal MCV, normal ferritin, V20 and folic acid. He has normal renal function at this time. The differential diagnosis was discussed with Matthew Schwartz today which includes anemia of chronic disease, early myelodysplasia, plasma cell disorder among others. Radiation therapy can cause some bone marrow toxicity that could have contributed to his recent anemia.  From a management standpoint, his anemia is rather mild and relatively asymptomatic. I have recommended that active surveillance and I will repeat his CBC in 3 months. I will also obtain erythropoietin level as well as a serum protein electrophoresis. If his erythropoietin level is inappropriate compared to his hemoglobin level, growth factor support such as Aranesp or Procrit can be used in the future to produce his red cell production. A bone marrow biopsy could be considered if his anemia worsens or he develops other cytopenias.  He will follow-up in 3 months and we'll discuss this further. All his questions were answered today.  2. Prostate cancer:  Status post radiation therapy with neoadjuvant hormone therapy. He is followed by urology regarding this issue.

## 2015-04-14 ENCOUNTER — Telehealth: Payer: Self-pay | Admitting: Oncology

## 2015-04-14 NOTE — Telephone Encounter (Signed)
[place]t-gave r/s appt and gave updated cop

## 2015-06-03 ENCOUNTER — Ambulatory Visit: Payer: Medicare Other | Admitting: Oncology

## 2015-06-03 ENCOUNTER — Other Ambulatory Visit: Payer: Medicare Other

## 2015-06-22 ENCOUNTER — Ambulatory Visit (HOSPITAL_BASED_OUTPATIENT_CLINIC_OR_DEPARTMENT_OTHER): Payer: Medicare Other | Admitting: Oncology

## 2015-06-22 ENCOUNTER — Other Ambulatory Visit (HOSPITAL_BASED_OUTPATIENT_CLINIC_OR_DEPARTMENT_OTHER): Payer: Medicare Other

## 2015-06-22 VITALS — BP 138/71 | HR 76 | Temp 98.4°F | Resp 17 | Ht 70.0 in | Wt 214.8 lb

## 2015-06-22 DIAGNOSIS — Z8546 Personal history of malignant neoplasm of prostate: Secondary | ICD-10-CM

## 2015-06-22 DIAGNOSIS — C61 Malignant neoplasm of prostate: Secondary | ICD-10-CM

## 2015-06-22 DIAGNOSIS — D649 Anemia, unspecified: Secondary | ICD-10-CM

## 2015-06-22 DIAGNOSIS — D508 Other iron deficiency anemias: Secondary | ICD-10-CM

## 2015-06-22 LAB — CBC WITH DIFFERENTIAL/PLATELET
BASO%: 0.3 % (ref 0.0–2.0)
Basophils Absolute: 0 10*3/uL (ref 0.0–0.1)
EOS ABS: 0.4 10*3/uL (ref 0.0–0.5)
EOS%: 6.6 % (ref 0.0–7.0)
HEMATOCRIT: 33.3 % — AB (ref 38.4–49.9)
HGB: 10.9 g/dL — ABNORMAL LOW (ref 13.0–17.1)
LYMPH#: 1.3 10*3/uL (ref 0.9–3.3)
LYMPH%: 19.4 % (ref 14.0–49.0)
MCH: 26.5 pg — ABNORMAL LOW (ref 27.2–33.4)
MCHC: 32.7 g/dL (ref 32.0–36.0)
MCV: 80.8 fL (ref 79.3–98.0)
MONO#: 0.8 10*3/uL (ref 0.1–0.9)
MONO%: 11.6 % (ref 0.0–14.0)
NEUT%: 62.1 % (ref 39.0–75.0)
NEUTROS ABS: 4.1 10*3/uL (ref 1.5–6.5)
PLATELETS: 267 10*3/uL (ref 140–400)
RBC: 4.12 10*6/uL — ABNORMAL LOW (ref 4.20–5.82)
RDW: 16.3 % — ABNORMAL HIGH (ref 11.0–14.6)
WBC: 6.7 10*3/uL (ref 4.0–10.3)

## 2015-06-22 LAB — COMPREHENSIVE METABOLIC PANEL
ALT: 18 U/L (ref 0–55)
ANION GAP: 9 meq/L (ref 3–11)
AST: 19 U/L (ref 5–34)
Albumin: 3.8 g/dL (ref 3.5–5.0)
Alkaline Phosphatase: 71 U/L (ref 40–150)
BILIRUBIN TOTAL: 0.4 mg/dL (ref 0.20–1.20)
BUN: 17.7 mg/dL (ref 7.0–26.0)
CALCIUM: 9.3 mg/dL (ref 8.4–10.4)
CO2: 25 mEq/L (ref 22–29)
CREATININE: 0.9 mg/dL (ref 0.7–1.3)
Chloride: 103 mEq/L (ref 98–109)
EGFR: 79 mL/min/{1.73_m2} — AB (ref >90)
Glucose: 126 mg/dl (ref 70–140)
Potassium: 4.8 mEq/L (ref 3.5–5.1)
Sodium: 137 mEq/L (ref 136–145)
TOTAL PROTEIN: 7.4 g/dL (ref 6.4–8.3)

## 2015-06-22 NOTE — Progress Notes (Signed)
Hematology and Oncology Follow Up Visit  Matthew Schwartz [id number] 07-31-1932 82 y.o. 06/22/2015 2:54 PM Matthew Schwartz, MDLittle, [name], MD   Principle Diagnosis: 82 year old gentleman with normocytic, normochromic anemia diagnosed in 2013. His hemoglobin have ranged between 10 and 11 during that time. Differential diagnosis includes anemia of chronic disease versus early myelodysplasia.   Prior Therapy: None.  Current therapy: Observation and surveillance.  Interim History: Matthew Schwartz presents today for a follow-up visit. He is a pleasant gentleman I saw in consultation November 2016 for the evaluation of anemia. His workup has been unrevealing at this time and he has been asymptomatic. Since the last visit, he reports no recent changes in his health. He denied any excessive fatigue, tiredness or dyspnea on exertion. He denied any hematochezia or melena. He continues to drive and attends to activities of daily living.  He does not report any headaches, blurry vision, syncope or seizures. He does not report any fevers or chills or sweats. Does not report any cough or hemoptysis or hematemesis. Does not report any nausea, vomiting or abdominal pain. Does not report any diarrhea but certainly he reports constipation. Does not report any hematuria, dysuria or incontinence. He does not report any skeletal complaints. He does report chronic knee pain that have been replaced. Remaining review of systems unremarkable.   Medications: I have reviewed the patient's current medications.  Current Outpatient Prescriptions  Medication Sig Dispense Refill  . amLODipine (NORVASC) 10 MG tablet Take 10 mg by mouth daily.  1  . aspirin 81 MG tablet Take 81 mg by mouth every morning.     Marland Kitchen atorvastatin (LIPITOR) 40 MG tablet Take 40 mg by mouth at bedtime.     . dutasteride (AVODART) 0.5 MG capsule Take 0.5 mg by mouth at bedtime.    . finasteride (PROSCAR) 5 MG tablet TAKE 1 TABLET DAILY AS DIRECTED.  3  .  hydrocortisone valerate ointment (WEST-CORT) 0.2 % Apply 1 application topically 2 (two) times daily.  2  . isosorbide mononitrate (IMDUR) 60 MG 24 hr tablet Take 60 mg by mouth every evening.    Marland Kitchen ketoconazole (NIZORAL) 2 % cream Apply 1 application topically daily as needed for irritation.    Marland Kitchen lactulose (CHRONULAC) 10 GM/15ML solution Take 10 g by mouth as needed for mild constipation.    . metFORMIN (GLUCOPHAGE-XR) 500 MG 24 hr tablet Take 500 mg by mouth 2 (two) times daily.    . metoprolol succinate (TOPROL-XL) 50 MG 24 hr tablet Take 50 mg by mouth 2 (two) times daily.  1  . nizatidine (AXID) 150 MG capsule Take 150 mg by mouth 2 (two) times daily as needed for heartburn.     Marland Kitchen PROCTOSOL HC 2.5 % rectal cream Place 1 application rectally daily as needed for hemorrhoids or itching.     . ramipril (ALTACE) 10 MG capsule Take 10 mg by mouth 2 (two) times daily.      No current facility-administered medications for this visit.     Allergies:  Allergies  Allergen Reactions  . Losartan Potassium Other (See Comments)    Unknown reaction that happened 15 years ago    Past Medical History, Surgical history, Social history, and Family History were reviewed and updated.   Physical Exam: Blood pressure 138/71, pulse 76, temperature 98.4 F (36.9 C), temperature source Oral, resp. rate 17, height '5\' 10"'  (1.778 m), weight 214 lb 12.8 oz (97.433 kg), SpO2 100 %. ECOG: 1 General appearance: alert and cooperative Head: Normocephalic,  without obvious abnormality Neck: no adenopathy Lymph nodes: Cervical, supraclavicular, and axillary nodes normal. Heart:regular rate and rhythm, S1, S2 normal, no murmur, click, rub or gallop Lung:chest clear, no wheezing, rales, normal symmetric air entry Abdomin: soft, non-tender, without masses or organomegaly EXT:no erythema, induration, or nodules   Lab Results: Lab Results  Component Value Date   WBC 6.7 06/22/2015   HGB 10.9* 06/22/2015   HCT  33.3* 06/22/2015   MCV 80.8 06/22/2015   PLT 267 06/22/2015     Chemistry      Component Value Date/Time   NA 136 08/09/2007 0421   K 3.4* 08/09/2007 0421   CL 106 08/09/2007 0421   CO2 24 08/09/2007 0421   BUN 10 08/09/2007 0421   CREATININE 0.81 12/26/2012 1220      Component Value Date/Time   CALCIUM 8.1* 08/09/2007 0421   ALKPHOS 54 07/30/2007 1050   AST 20 07/30/2007 1050   ALT 20 07/30/2007 1050   BILITOT 0.8 07/30/2007 1050         Impression and Plan:  82 year old gentleman with the following issues:  1. Normocytic, chronic anemia that have been fluctuating between 10 and 11 since 2013. The differential diagnosis includes anemia of chronic disease, anemia related to previous radiation therapy or early myelodysplasia. His workup has been unrevealing for any vitamin deficiency at this time. His erythropoietin and serum protein electrophoresis were obtained today and currently pending.  From a management standpoint, he does not require any intervention. He is completely asymptomatic at this time and his anemia level is rather mild  I recommended periodic monitoring of his hemoglobin every 6 months and potentially initiate therapy with growth factor support if his hemoglobin drifts below 10. This will be a consideration of his erythropoietin level is low.  He prefers to have his labs done periodically by his primary care provider and I certainly do not have any objections to that. I advised him that if his hemoglobin drops below 10 then I need to reevaluate him at that time and consider a bone marrow biopsy before initiating growth factor support.  He is in favor of this plan and I will be happy to see him in the future as needed if his hemoglobin drifts below 10.  2. History of prostate cancer: No evidence of recurrence at this time. He continues to follow with urology regarding this issue.   Matthew Button, MD 2/22/20172:54 PM

## 2015-06-23 LAB — ERYTHROPOIETIN: ERYTHROPOIETIN: 14 m[IU]/mL (ref 2.6–18.5)

## 2015-06-24 LAB — MULTIPLE MYELOMA PANEL, SERUM
ALBUMIN/GLOB SERPL: 1.1 (ref 0.7–1.7)
ALPHA 1: 0.2 g/dL (ref 0.0–0.4)
ALPHA2 GLOB SERPL ELPH-MCNC: 0.7 g/dL (ref 0.4–1.0)
Albumin SerPl Elph-Mcnc: 3.5 g/dL (ref 2.9–4.4)
B-GLOBULIN SERPL ELPH-MCNC: 1.1 g/dL (ref 0.7–1.3)
Gamma Glob SerPl Elph-Mcnc: 1.3 g/dL (ref 0.4–1.8)
Globulin, Total: 3.4 g/dL (ref 2.2–3.9)
IgA, Qn, Serum: 305 mg/dL (ref 61–437)
IgM, Qn, Serum: 70 mg/dL (ref 15–143)
TOTAL PROTEIN: 6.9 g/dL (ref 6.0–8.5)

## 2016-01-05 ENCOUNTER — Encounter: Payer: Self-pay | Admitting: Cardiology

## 2016-03-05 ENCOUNTER — Ambulatory Visit: Payer: Medicare Other | Admitting: Cardiology

## 2016-03-07 ENCOUNTER — Encounter: Payer: Self-pay | Admitting: Cardiology

## 2016-03-07 ENCOUNTER — Encounter (INDEPENDENT_AMBULATORY_CARE_PROVIDER_SITE_OTHER): Payer: Self-pay

## 2016-03-07 ENCOUNTER — Ambulatory Visit (INDEPENDENT_AMBULATORY_CARE_PROVIDER_SITE_OTHER): Payer: Medicare Other | Admitting: Cardiology

## 2016-03-07 VITALS — BP 142/84 | HR 74 | Ht 70.0 in | Wt 218.8 lb

## 2016-03-07 DIAGNOSIS — I10 Essential (primary) hypertension: Secondary | ICD-10-CM | POA: Diagnosis not present

## 2016-03-07 DIAGNOSIS — I2583 Coronary atherosclerosis due to lipid rich plaque: Secondary | ICD-10-CM

## 2016-03-07 DIAGNOSIS — E7800 Pure hypercholesterolemia, unspecified: Secondary | ICD-10-CM

## 2016-03-07 DIAGNOSIS — I2510 Atherosclerotic heart disease of native coronary artery without angina pectoris: Secondary | ICD-10-CM | POA: Diagnosis not present

## 2016-03-07 NOTE — Patient Instructions (Signed)

## 2016-03-07 NOTE — Progress Notes (Signed)
[place]. [address], [address], [place]  [address] Phone: [phone] Fax:  [phone]  Date:  03/07/2016   ID:  Matthew Schwartz, DOB 1932/11/03, MRN [id number]  PCP:  Gennette Pac, MD   History of Present Illness: Matthew Schwartz is a 82 y.o. male with coronary artery disease PTCA (no stent) to RCA, diabetes, hypertension, hyperlipidemia here for followup last stress test in 2007 showed mild to moderate ischemia in the inferoapical/apical lateral region. EF was 59%.   He has been doing very well with no exertional angina, no shortness of breath. He has a strictly vegetarian diet. He lives periodically with his children. He stated he had no income.  He told me about a story where he was concerned about the financial aspects of his heart disease when he was first diagnosed and Dr. Leonia Reeves was very kind regarding this matter. [name]  Overall feeling well, no chest pain, no syncope, no bleeding   Wt Readings from Last 3 Encounters:  03/07/16 218 lb 12.8 oz (99.2 kg)  06/22/15 214 lb 12.8 oz (97.4 kg)  03/02/15 216 lb 8 oz (98.2 kg)     Past Medical History:  Diagnosis Date  . Diabetes mellitus without complication ([place])   . Dyslipidemia   . GERD (gastroesophageal reflux disease)   . Hypertension   . Prostate cancer ([place]) 12/23/13   Adenocarcinoma  . PSA elevation   . S/P radiation therapy 05/18/2014 through 07/16/2014    Prostate 7800 cGy in 40 sessions, seminal vesicles 5600 cGy in 40 sessions     Past Surgical History:  Procedure Laterality Date  . ANGIOPLASTY  2000  . cataract surgery  2000  . COLONOSCOPY    . PROSTATE BIOPSY  12/23/13  . REPLACEMENT TOTAL KNEE  2008/2009   bilateral    Current Outpatient Prescriptions  Medication Sig Dispense Refill  . amLODipine (NORVASC) 10 MG tablet Take 10 mg by mouth daily.  1  . aspirin 81 MG tablet Take 81 mg by  mouth every morning.     Marland Kitchen atorvastatin (LIPITOR) 40 MG tablet Take 40 mg by mouth at bedtime.     . hydrocortisone valerate ointment (WEST-CORT) 0.2 % Apply 1 application topically 2 (two) times daily.  2  . isosorbide mononitrate (IMDUR) 60 MG 24 hr tablet Take 60 mg by mouth every evening.    Marland Kitchen ketoconazole (NIZORAL) 2 % cream Apply 1 application topically daily as needed for irritation.    Marland Kitchen lactulose (CHRONULAC) 10 GM/15ML solution Take 10 g by mouth as needed for mild constipation.    . metFORMIN (GLUCOPHAGE-XR) 500 MG 24 hr tablet Take 500 mg by mouth 2 (two) times daily.    . metoprolol succinate (TOPROL-XL) 50 MG 24 hr tablet Take 50 mg by mouth 2 (two) times daily.  1  . nizatidine (AXID) 150 MG capsule Take 150 mg by mouth 2 (two) times daily as needed for heartburn.     Marland Kitchen PROCTOSOL HC 2.5 % rectal cream Place 1 application rectally daily as needed for hemorrhoids or itching.     . ramipril (ALTACE) 10 MG capsule Take 10 mg by mouth 2 (two) times daily.      No current facility-administered medications for this visit.     Allergies:    Allergies  Allergen Reactions  . Losartan Potassium Other (See Comments)    Unknown reaction that happened 15 years ago    Social History:  The patient  reports that he quit smoking about 21 years ago. He does not have any smokeless tobacco history on file. He reports that he does not drink alcohol or use drugs.   Family History  Problem Relation Age of Onset  . CVA Mother   . Heart disease Sister   . Coronary artery disease Sister   . Heart disease Brother   . Coronary artery disease Brother   . Heart disease Brother   . Coronary artery disease Brother     ROS:  Please see the history of present illness.   No fevers, no chills, no orthopnea, no syncope, no PND  All other systems reviewed and negative.   PHYSICAL EXAM: VS:  BP (!) 142/84   Pulse 74   Ht 5\' 10"  (1.778 m)   Wt 218 lb 12.8 oz (99.2 kg)   BMI 31.39 kg/m  Well  nourished, well developed, in no acute distress HEENT: normal, [place]/AT, EOMI Neck: no JVD, normal carotid upstroke, no bruit Cardiac:  normal S1, S2; RRR; no murmur Lungs:  clear to auscultation bilaterally, no wheezing, rhonchi or rales Abd: soft, nontender, no hepatomegaly, no bruits Ext:  trace edema,  Difficult to palpate distal pulses Skin: warm and dry GU: deferred Neuro: no focal abnormalities noted, AAO x 3  EKG:  Today 02/16/15 - NSR 72 personally viewed , prior8/28/15-sinus rhythm, LVH, nonspecific ST-T wave changes Labs: LDL 78 ABI: normal 02/28/15  ASSESSMENT AND PLAN:  1. Coronary artery disease-currently stable, doing well, no anginal symptoms. Aggressive secondary prevention. Current antianginal medications reviewed. We will continue. He appreciates what Dr. Leonia Reeves did for him. 2. Hyperlipidemia-LDL goal less than 70. Atorvastatin. LDL  In the past 78. 3. Hypertension-well-controlled. Medications reviewed. 4. Prior ABIs normal. Reassuring. 5. One-year followup  Signed, Candee Furbish, MD [facility]  03/07/2016 4:27 PM

## 2016-08-09 ENCOUNTER — Telehealth: Payer: Self-pay | Admitting: Oncology

## 2016-08-09 ENCOUNTER — Encounter: Payer: Self-pay | Admitting: *Deleted

## 2016-08-09 NOTE — Telephone Encounter (Signed)
Received a referral from Dr. Rex Kras. Pt is established w/Dr. Alen Blew. Msg sent to MD to fwd an appt to scheduling.

## 2016-08-24 ENCOUNTER — Other Ambulatory Visit: Payer: Self-pay | Admitting: Oncology

## 2016-08-24 DIAGNOSIS — D649 Anemia, unspecified: Secondary | ICD-10-CM

## 2016-09-07 ENCOUNTER — Telehealth: Payer: Self-pay | Admitting: Oncology

## 2016-09-07 ENCOUNTER — Ambulatory Visit (HOSPITAL_BASED_OUTPATIENT_CLINIC_OR_DEPARTMENT_OTHER): Payer: Medicare Other | Admitting: Oncology

## 2016-09-07 ENCOUNTER — Other Ambulatory Visit (HOSPITAL_BASED_OUTPATIENT_CLINIC_OR_DEPARTMENT_OTHER): Payer: Medicare Other

## 2016-09-07 VITALS — BP 134/55 | HR 63 | Temp 98.4°F | Resp 19 | Ht 70.0 in | Wt 209.2 lb

## 2016-09-07 DIAGNOSIS — D649 Anemia, unspecified: Secondary | ICD-10-CM | POA: Diagnosis not present

## 2016-09-07 DIAGNOSIS — D631 Anemia in chronic kidney disease: Secondary | ICD-10-CM

## 2016-09-07 DIAGNOSIS — Z8546 Personal history of malignant neoplasm of prostate: Secondary | ICD-10-CM | POA: Diagnosis not present

## 2016-09-07 DIAGNOSIS — N189 Chronic kidney disease, unspecified: Secondary | ICD-10-CM

## 2016-09-07 LAB — CBC WITH DIFFERENTIAL/PLATELET
BASO%: 0.5 % (ref 0.0–2.0)
Basophils Absolute: 0 10*3/uL (ref 0.0–0.1)
EOS ABS: 1 10*3/uL — AB (ref 0.0–0.5)
EOS%: 13.1 % — ABNORMAL HIGH (ref 0.0–7.0)
HEMATOCRIT: 28.5 % — AB (ref 38.4–49.9)
HEMOGLOBIN: 9.1 g/dL — AB (ref 13.0–17.1)
LYMPH#: 1.5 10*3/uL (ref 0.9–3.3)
LYMPH%: 20.2 % (ref 14.0–49.0)
MCH: 25.4 pg — ABNORMAL LOW (ref 27.2–33.4)
MCHC: 31.9 g/dL — ABNORMAL LOW (ref 32.0–36.0)
MCV: 79.6 fL (ref 79.3–98.0)
MONO#: 0.7 10*3/uL (ref 0.1–0.9)
MONO%: 9.7 % (ref 0.0–14.0)
NEUT%: 56.5 % (ref 39.0–75.0)
NEUTROS ABS: 4.1 10*3/uL (ref 1.5–6.5)
NRBC: 0 % (ref 0–0)
PLATELETS: 247 10*3/uL (ref 140–400)
RBC: 3.58 10*6/uL — AB (ref 4.20–5.82)
RDW: 17.1 % — AB (ref 11.0–14.6)
WBC: 7.3 10*3/uL (ref 4.0–10.3)

## 2016-09-07 NOTE — Progress Notes (Signed)
Hematology and Oncology Follow Up Visit  Matthew Schwartz [id number] August 15, 1932 83 y.o. 09/07/2016 4:22 PM Matthew Schwartz MDLittle, [name], MD   Principle Diagnosis: 83 year old gentleman with normocytic, normochromic anemia diagnosed in 2013. His hemoglobin have ranged between 10 and 11 during that time. Differential diagnosis includes anemia of chronic disease versus early myelodysplasia.   Prior Therapy: None.  Current therapy: Observation and surveillance.  Interim History: Matthew Schwartz presents today for a follow-up visit. Since the last visit, he reports no major complaints. He has noted progressive fatigue and tiredness although not interfering with his activity level or performance status. He denied any acute blood loss such as hematochezia, melena or epistaxis. He denied any hematuria. He have received a colonoscopy and endoscopy in the past.  His former status and quality of life has not changed dramatically. He continues to drive and attends to activities of daily living.  He does not report any headaches, blurry vision, syncope or seizures. He does not report any fevers or chills or sweats. Does not report any cough or hemoptysis or hematemesis. Does not report any nausea, vomiting or abdominal pain. Does not report any diarrhea but certainly he reports constipation. Does not report any hematuria, dysuria or incontinence. He does not report any skeletal complaints. He does report chronic knee pain that have been replaced. Remaining review of systems unremarkable.   Medications: I have reviewed the patient's current medications.  Current Outpatient Prescriptions  Medication Sig Dispense Refill  . amLODipine (NORVASC) 10 MG tablet Take 10 mg by mouth daily.  1  . aspirin 81 MG tablet Take 81 mg by mouth every morning.     Marland Kitchen atorvastatin (LIPITOR) 40 MG tablet Take 40 mg by mouth at bedtime.     . hydrocortisone valerate ointment (WEST-CORT) 0.2 % Apply 1 application topically 2 (two) times  daily.  2  . isosorbide mononitrate (IMDUR) 60 MG 24 hr tablet Take 60 mg by mouth every evening.    Marland Kitchen ketoconazole (NIZORAL) 2 % cream Apply 1 application topically daily as needed for irritation.    Marland Kitchen lactulose (CHRONULAC) 10 GM/15ML solution Take 10 g by mouth as needed for mild constipation.    . metFORMIN (GLUCOPHAGE-XR) 500 MG 24 hr tablet Take 500 mg by mouth 2 (two) times daily.    . metoprolol succinate (TOPROL-XL) 50 MG 24 hr tablet Take 50 mg by mouth 2 (two) times daily.  1  . nizatidine (AXID) 150 MG capsule Take 150 mg by mouth 2 (two) times daily as needed for heartburn.     Marland Kitchen PROCTOSOL HC 2.5 % rectal cream Place 1 application rectally daily as needed for hemorrhoids or itching.     . ramipril (ALTACE) 10 MG capsule Take 10 mg by mouth 2 (two) times daily.      No current facility-administered medications for this visit.      Allergies:  Allergies  Allergen Reactions  . Losartan Potassium Other (See Comments)    Unknown reaction that happened 15 years ago    Past Medical History, Surgical history, Social history, and Family History were reviewed and updated.   Physical Exam: Blood pressure (!) 134/55, pulse 63, temperature 98.4 F (36.9 C), temperature source Oral, resp. rate 19, height '5\' 10"'  (1.778 m), weight 209 lb 3.2 oz (94.9 kg), SpO2 100 %. ECOG: 1 General appearance: alert and cooperative appeared without distress. Head: Normocephalic, without obvious abnormality no oral thrush or ulcers. Neck: no adenopathy Lymph nodes: Cervical, supraclavicular, and axillary nodes normal.  Heart:regular rate and rhythm, S1, S2 normal, no murmur, click, rub or gallop Lung:chest clear, no wheezing, rales, normal symmetric air entry Abdomin: soft, non-tender, without masses or organomegaly EXT:no erythema, induration, or nodules edema noted bilaterally.   Lab Results: Lab Results  Component Value Date   WBC 7.3 09/07/2016   HGB 9.1 (L) 09/07/2016   HCT 28.5 (L)  09/07/2016   MCV 79.6 09/07/2016   PLT 247 09/07/2016     Chemistry      Component Value Date/Time   NA 137 06/22/2015 1421   K 4.8 06/22/2015 1421   CL 106 08/09/2007 0421   CO2 25 06/22/2015 1421   BUN 17.7 06/22/2015 1421   CREATININE 0.9 06/22/2015 1421      Component Value Date/Time   CALCIUM 9.3 06/22/2015 1421   ALKPHOS 71 06/22/2015 1421   AST 19 06/22/2015 1421   ALT 18 06/22/2015 1421   BILITOT 0.40 06/22/2015 1421         Impression and Plan:  83 year old gentleman with the following issues:  1. Normocytic, chronic anemia that have been fluctuating between 10 and 11 since 2013. His hemoglobin has been drifting over the last 5 years. His hemoglobin today is 9.1 which has dropped from 10.9 in February 2017. His workup at that time did not reveal any clear-cut etiology with normal iron studies, serum protein electrophoresis and B12 levels.  The differential diagnosis includes anemia of chronic disease, anemia related to previous radiation therapy or early myelodysplasia.   From a management standpoint, improving his hemoglobin will likely result in improvement in his quality of life and alleviating some of his fatigue symptoms. In order to treat his anemia effectively, he'll require a bone marrow biopsy for further determination.  Risks and benefits of this procedure was discussed today. Complications include pain, infection and bleeding were discussed although these are rare. This procedure will be performed by interventional radiology in the near future. Depending on the findings treatment will be prescribed.  We have discussed the role of growth factor support in the form of Procrit or Aranesp. These will be series of injections every 2-3 weeks and attempt to get his hemoglobin close to 11 which will improve his symptoms.  After discussion today, he is agreeable to proceed with a bone marrow biopsy first and we will evaluate the results and proceed with Aranesp  after that. He understands that he might be developing marrow failure that is multifactorial in nature and would likely require transfusions in the future to maintain his hemoglobin at an adequate level.  2. History of prostate cancer: No evidence of recurrence at this time. He continues to follow with urology regarding this issue.  3. Follow-up: The next few weeks to discuss the results of his bone marrow biopsy and likely initiate growth factor support.   [facility], MD 5/11/20184:22 PM

## 2016-09-07 NOTE — Telephone Encounter (Signed)
Appointments scheduled per 5.11.18 LOS. Patient given AVS report and calendars with future scheduled appointments. Patient requested date/time.

## 2016-09-12 ENCOUNTER — Other Ambulatory Visit: Payer: Self-pay | Admitting: Radiology

## 2016-09-14 ENCOUNTER — Ambulatory Visit (HOSPITAL_COMMUNITY)
Admission: RE | Admit: 2016-09-14 | Discharge: 2016-09-14 | Disposition: A | Discharge status: Home / Self Care | Payer: Medicare Other | Source: Ambulatory Visit | Attending: Oncology | Admitting: Oncology

## 2016-09-14 ENCOUNTER — Encounter (HOSPITAL_COMMUNITY): Payer: Self-pay

## 2016-09-14 ENCOUNTER — Other Ambulatory Visit: Payer: Self-pay | Admitting: Oncology

## 2016-09-14 DIAGNOSIS — D649 Anemia, unspecified: Secondary | ICD-10-CM | POA: Diagnosis present

## 2016-09-14 DIAGNOSIS — Z87891 Personal history of nicotine dependence: Secondary | ICD-10-CM | POA: Diagnosis not present

## 2016-09-14 DIAGNOSIS — Z8249 Family history of ischemic heart disease and other diseases of the circulatory system: Secondary | ICD-10-CM | POA: Insufficient documentation

## 2016-09-14 DIAGNOSIS — I10 Essential (primary) hypertension: Secondary | ICD-10-CM | POA: Diagnosis not present

## 2016-09-14 DIAGNOSIS — Z923 Personal history of irradiation: Secondary | ICD-10-CM | POA: Diagnosis not present

## 2016-09-14 DIAGNOSIS — Z8546 Personal history of malignant neoplasm of prostate: Secondary | ICD-10-CM | POA: Diagnosis not present

## 2016-09-14 DIAGNOSIS — D721 Eosinophilia: Secondary | ICD-10-CM | POA: Diagnosis not present

## 2016-09-14 DIAGNOSIS — E785 Hyperlipidemia, unspecified: Secondary | ICD-10-CM | POA: Insufficient documentation

## 2016-09-14 DIAGNOSIS — Z79899 Other long term (current) drug therapy: Secondary | ICD-10-CM | POA: Diagnosis not present

## 2016-09-14 DIAGNOSIS — K219 Gastro-esophageal reflux disease without esophagitis: Secondary | ICD-10-CM | POA: Diagnosis not present

## 2016-09-14 DIAGNOSIS — Z823 Family history of stroke: Secondary | ICD-10-CM | POA: Diagnosis not present

## 2016-09-14 DIAGNOSIS — Z7984 Long term (current) use of oral hypoglycemic drugs: Secondary | ICD-10-CM | POA: Diagnosis not present

## 2016-09-14 DIAGNOSIS — E119 Type 2 diabetes mellitus without complications: Secondary | ICD-10-CM | POA: Insufficient documentation

## 2016-09-14 DIAGNOSIS — Z7982 Long term (current) use of aspirin: Secondary | ICD-10-CM | POA: Insufficient documentation

## 2016-09-14 DIAGNOSIS — Z96653 Presence of artificial knee joint, bilateral: Secondary | ICD-10-CM | POA: Diagnosis not present

## 2016-09-14 LAB — BASIC METABOLIC PANEL
Anion gap: 8 (ref 5–15)
BUN: 18 mg/dL (ref 6–20)
CALCIUM: 9.2 mg/dL (ref 8.9–10.3)
CO2: 24 mmol/L (ref 22–32)
Chloride: 105 mmol/L (ref 101–111)
Creatinine, Ser: 0.74 mg/dL (ref 0.61–1.24)
GFR calc Af Amer: >60 mL/min (ref >60)
GLUCOSE: 114 mg/dL — AB (ref 65–99)
POTASSIUM: 4.1 mmol/L (ref 3.5–5.1)
Sodium: 137 mmol/L (ref 135–145)

## 2016-09-14 LAB — CBC WITH DIFFERENTIAL/PLATELET
BASOS ABS: 0 10*3/uL (ref 0.0–0.1)
Basophils Relative: 0 %
EOS PCT: 14 %
Eosinophils Absolute: 1 10*3/uL — ABNORMAL HIGH (ref 0.0–0.7)
HEMATOCRIT: 30 % — AB (ref 39.0–52.0)
Hemoglobin: 9.8 g/dL — ABNORMAL LOW (ref 13.0–17.0)
LYMPHS PCT: 19 %
Lymphs Abs: 1.4 10*3/uL (ref 0.7–4.0)
MCH: 25.5 pg — ABNORMAL LOW (ref 26.0–34.0)
MCHC: 32.7 g/dL (ref 30.0–36.0)
MCV: 77.9 fL — AB (ref 78.0–100.0)
MONO ABS: 0.6 10*3/uL (ref 0.1–1.0)
MONOS PCT: 8 %
Neutro Abs: 4.5 10*3/uL (ref 1.7–7.7)
Neutrophils Relative %: 59 %
PLATELETS: 252 10*3/uL (ref 150–400)
RBC: 3.85 MIL/uL — ABNORMAL LOW (ref 4.22–5.81)
RDW: 16.9 % — AB (ref 11.5–15.5)
WBC: 7.5 10*3/uL (ref 4.0–10.5)

## 2016-09-14 LAB — GLUCOSE, CAPILLARY: GLUCOSE-CAPILLARY: 110 mg/dL — AB (ref 65–99)

## 2016-09-14 LAB — PROTIME-INR
INR: 1
Prothrombin Time: 13.2 seconds (ref 11.4–15.2)

## 2016-09-14 MED ORDER — MIDAZOLAM HCL 2 MG/2ML IJ SOLN
INTRAMUSCULAR | Status: AC
  Filled 2016-09-14: qty 4

## 2016-09-14 MED ORDER — SODIUM CHLORIDE 0.9 % IV SOLN
INTRAVENOUS | Status: DC
  Administered 2016-09-14: 10:00:00 via INTRAVENOUS

## 2016-09-14 MED ORDER — NALOXONE HCL 0.4 MG/ML IJ SOLN
INTRAMUSCULAR | Status: AC
  Filled 2016-09-14: qty 1

## 2016-09-14 MED ORDER — FLUMAZENIL 0.5 MG/5ML IV SOLN
INTRAVENOUS | Status: AC
  Filled 2016-09-14: qty 5

## 2016-09-14 MED ORDER — HYDROCODONE-ACETAMINOPHEN 5-325 MG PO TABS: 1.0000 | ORAL_TABLET | ORAL | Status: DC | PRN

## 2016-09-14 MED ORDER — LIDOCAINE HCL (PF) 1 % IJ SOLN
INTRAMUSCULAR | Status: DC | PRN
  Administered 2016-09-14: 10 mL via SUBCUTANEOUS

## 2016-09-14 MED ORDER — FENTANYL CITRATE (PF) 100 MCG/2ML IJ SOLN
INTRAMUSCULAR | Status: AC | PRN
  Administered 2016-09-14 (×2): 50 ug via INTRAVENOUS

## 2016-09-14 MED ORDER — FENTANYL CITRATE (PF) 100 MCG/2ML IJ SOLN
INTRAMUSCULAR | Status: AC
  Filled 2016-09-14: qty 4

## 2016-09-14 MED ORDER — MIDAZOLAM HCL 2 MG/2ML IJ SOLN
INTRAMUSCULAR | Status: AC | PRN
  Administered 2016-09-14 (×2): 1 mg via INTRAVENOUS

## 2016-09-14 NOTE — Discharge Instructions (Signed)
Bone Marrow Aspiration and Bone Marrow Biopsy, Adult, Care After °This sheet gives you information about how to care for yourself after your procedure. Your health care provider may also give you more specific instructions. If you have problems or questions, contact your health care provider. °What can I expect after the procedure? °After the procedure, it is common to have: °· Mild pain and tenderness. °· Swelling. °· Bruising. °Follow these instructions at home: °· Take over-the-counter or prescription medicines only as told by your health care provider. °· Do not take baths, swim, or use a hot tub until your health care provider approves. Ask if you can take a shower or have a sponge bath. °· Follow instructions from your health care provider about how to take care of the puncture site. Make sure you: °¨ Wash your hands with soap and water before you change your bandage (dressing). If soap and water are not available, use hand sanitizer. °¨ Change your dressing as told by your health care provider. °· Check your puncture site every day for signs of infection. Check for: °¨ More redness, swelling, or pain. °¨ More fluid or blood. °¨ Warmth. °¨ Pus or a bad smell. °· Return to your normal activities as told by your health care provider. Ask your health care provider what activities are safe for you. °· Do not drive for 24 hours if you were given a medicine to help you relax (sedative). °· Keep all follow-up visits as told by your health care provider. This is important. °Contact a health care provider if: °· You have more redness, swelling, or pain around the puncture site. °· You have more fluid or blood coming from the puncture site. °· Your puncture site feels warm to the touch. °· You have pus or a bad smell coming from the puncture site. °· You have a fever. °· Your pain is not controlled with medicine. °This information is not intended to replace advice given to you by your health care provider. Make sure you  discuss any questions you have with your health care provider. °Document Released: 11/03/2004 Document Revised: 11/04/2015 Document Reviewed: 09/28/2015 °Elsevier Interactive Patient Education © 2017 [employer] °Moderate Conscious Sedation, Adult, Care After °These instructions provide you with information about caring for yourself after your procedure. Your health care provider may also give you more specific instructions. Your treatment has been planned according to current medical practices, but problems sometimes occur. Call your health care provider if you have any problems or questions after your procedure. °What can I expect after the procedure? °After your procedure, it is common: °· To feel sleepy for several hours. °· To feel clumsy and have poor balance for several hours. °· To have poor judgment for several hours. °· To vomit if you eat too soon. °Follow these instructions at home: °For at least 24 hours after the procedure:  ° °· Do not: °¨ Participate in activities where you could fall or become injured. °¨ Drive. °¨ Use heavy machinery. °¨ Drink alcohol. °¨ Take sleeping pills or medicines that cause drowsiness. °¨ Make important decisions or sign legal documents. °¨ Take care of children on your own. °· Rest. °Eating and drinking  °· Follow the diet recommended by your health care provider. °· If you vomit: °¨ Drink water, juice, or soup when you can drink without vomiting. °¨ Make sure you have little or no nausea before eating solid foods. °General instructions  °· Have a responsible adult stay with you until you   are awake and alert. °· Take over-the-counter and prescription medicines only as told by your health care provider. °· If you smoke, do not smoke without supervision. °· Keep all follow-up visits as told by your health care provider. This is important. °Contact a health care provider if: °· You keep feeling nauseous or you keep vomiting. °· You feel light-headed. °· You develop a  rash. °· You have a fever. °Get help right away if: °· You have trouble breathing. °This information is not intended to replace advice given to you by your health care provider. Make sure you discuss any questions you have with your health care provider. °Document Released: 02/04/2013 Document Revised: 09/19/2015 Document Reviewed: 08/06/2015 °Elsevier Interactive Patient Education © 2017 Elsevier Inc. ° °

## 2016-09-14 NOTE — H&P (Signed)
Chief Complaint: Anemia  Referring Physician(s): Wyatt Portela  Supervising Physician: Jacqulynn Cadet  Patient Status: [facility] - Out-pt  History of Present Illness: Matthew Schwartz is a 83 y.o. male with normocytic, normochromic anemia diagnosed in 2013.   His hemoglobin has ranged between 10 and 11 during that time.   Differential diagnosis includes anemia of chronic disease versus early myelodysplasia.  We are asked to perform a CT guided bone marrow biopsy.  Other medical issues included DM, HTN, and prostate cancer.  He is NPO. No blood thinners.  Past Medical History:  Diagnosis Date  . Diabetes mellitus without complication ([place])   . Dyslipidemia   . GERD (gastroesophageal reflux disease)   . Hypertension   . Prostate cancer ([place]) 12/23/13   Adenocarcinoma  . PSA elevation   . S/P radiation therapy 05/18/2014 through 07/16/2014    Prostate 7800 cGy in 40 sessions, seminal vesicles 5600 cGy in 40 sessions     Past Surgical History:  Procedure Laterality Date  . ANGIOPLASTY  2000  . cataract surgery  2000  . COLONOSCOPY    . PROSTATE BIOPSY  12/23/13  . REPLACEMENT TOTAL KNEE  2008/2009   bilateral    Allergies: Losartan potassium  Medications: Prior to Admission medications   Medication Sig Start Date End Date Taking? Authorizing Provider  amLODipine (NORVASC) 10 MG tablet Take 10 mg by mouth daily. 03/13/14   [provider]  aspirin 81 MG tablet Take 81 mg by mouth every morning.     [provider]  atorvastatin (LIPITOR) 40 MG tablet Take 40 mg by mouth at bedtime.     [provider]  hydrocortisone valerate ointment (WEST-CORT) 0.2 % Apply 1 application topically 2 (two) times daily. 02/22/15   [provider]  isosorbide mononitrate (IMDUR) 60 MG 24 hr tablet Take 60 mg by mouth every evening.    [provider]  ketoconazole  (NIZORAL) 2 % cream Apply 1 application topically daily as needed for irritation.    [provider]  lactulose (CHRONULAC) 10 GM/15ML solution Take 10 g by mouth as needed for mild constipation.    [provider]  metFORMIN (GLUCOPHAGE-XR) 500 MG 24 hr tablet Take 500 mg by mouth 2 (two) times daily.    [provider]  metoprolol succinate (TOPROL-XL) 50 MG 24 hr tablet Take 50 mg by mouth 2 (two) times daily. 04/07/14   [provider]  nizatidine (AXID) 150 MG capsule Take 150 mg by mouth 2 (two) times daily as needed for heartburn.     [provider]  PROCTOSOL HC 2.5 % rectal cream Place 1 application rectally daily as needed for hemorrhoids or itching.  11/06/13   [provider]  ramipril (ALTACE) 10 MG capsule Take 10 mg by mouth 2 (two) times daily.     [provider]     Family History  Problem Relation Age of Onset  . CVA Mother   . Heart disease Sister   . Coronary artery disease Sister   . Heart disease Brother   . Coronary artery disease Brother   . Heart disease Brother   . Coronary artery disease Brother     Social History   Social History  . Marital status: Married    Spouse name: N/A  . Number of children: N/A  . Years of education: N/A   Occupational History  . retired    Social History Main Topics  . Smoking status: Former Smoker  Quit date: 06/14/1994  . Smokeless tobacco: None  . Alcohol use No  . Drug use: No  . Sexual activity: Not Asked   Other Topics Concern  . None   Social History Narrative  . None    Review of Systems: A 12 point ROS discussed Review of Systems  Constitutional: Positive for activity change and fatigue. Negative for chills and fever.  HENT: Negative.   Respiratory: Negative.   Cardiovascular: Negative.   Gastrointestinal: Negative.   Genitourinary: Negative.   Musculoskeletal: Negative.   Neurological: Positive for weakness.  Hematological: Negative.     Psychiatric/Behavioral: Negative.     Vital Signs: BP 140/66 (BP Location: Right Arm)   Pulse 62   Temp 98.4 F (36.9 C) (Oral)   Resp 18   SpO2 100%   Physical Exam  Constitutional: He is oriented to person, place, and time. He appears well-developed and well-nourished.  HENT:  Head: Normocephalic and atraumatic.  Eyes: EOM are normal.  Neck: Normal range of motion.  Cardiovascular: Normal rate, regular rhythm and normal heart sounds.   Pulmonary/Chest: Effort normal and breath sounds normal. No respiratory distress. He has no wheezes.  Abdominal: Soft. He exhibits no distension. There is no tenderness.  Musculoskeletal: Normal range of motion.  Neurological: He is alert and oriented to person, place, and time.  Skin: Skin is warm and dry.  Psychiatric: He has a normal mood and affect. His behavior is normal. Judgment and thought content normal.  Vitals reviewed.   Mallampati Score:  MD Evaluation Airway: WNL Heart: WNL Abdomen: WNL Chest/ Lungs: WNL ASA  Classification: 3 Mallampati/Airway Score: One  Imaging: No results found.  Labs:  CBC:  Recent Labs  09/07/16 1530 09/14/16 0924  WBC 7.3 7.5  HGB 9.1* 9.8*  HCT 28.5* 30.0*  PLT 247 252    COAGS:  Recent Labs  09/14/16 0924  INR 1.00    BMP: No results for input(s): NA, K, CL, CO2, GLUCOSE, BUN, CALCIUM, CREATININE, GFRNONAA, GFRAA in the last 8760 hours.  Invalid input(s): CMP  LIVER FUNCTION TESTS: No results for input(s): BILITOT, AST, ALT, ALKPHOS, PROT, ALBUMIN in the last 8760 hours.  TUMOR MARKERS: No results for input(s): AFPTM, CEA, CA199, CHROMGRNA in the last 8760 hours.  Assessment and Plan:  Anemia of chronic disease versus early myelodysplasia.  Will proceed with CT guided bone marrow biopsy today by Dr. Laurence Ferrari  Risks and Benefits discussed with the patient including, but not limited to bleeding, infection, damage to adjacent structures or low yield requiring  additional tests.  All of the patient's questions were answered, patient is agreeable to proceed. Consent signed and in chart.  Thank you for this interesting consult.  I greatly enjoyed meeting Baltazar Pekala and look forward to participating in their care.  A copy of this report was sent to the requesting provider on this date.  Electronically Signed: Murrell Redden, PA-C 09/14/2016, 10:05 AM   I spent a total of 20 Minutes  in face to face in clinical consultation, greater than 50% of which was counseling/coordinating care for bone marrow biopsy

## 2016-09-14 NOTE — Procedures (Signed)
Interventional Radiology Procedure Note  Procedure: CT guided aspirate and core biopsy of right iliac bone Complications: None Recommendations: - Bedrest supine x 1 hrs - Hydrocodone PRN  Pain - Follow biopsy results  Signed,  Bonetta Mostek K. Koleson Reifsteck, MD   

## 2016-09-26 LAB — CHROMOSOME ANALYSIS, BONE MARROW

## 2016-10-08 ENCOUNTER — Encounter (HOSPITAL_COMMUNITY): Payer: Self-pay

## 2016-10-12 ENCOUNTER — Ambulatory Visit: Payer: Medicare Other

## 2016-10-12 ENCOUNTER — Ambulatory Visit (HOSPITAL_BASED_OUTPATIENT_CLINIC_OR_DEPARTMENT_OTHER): Payer: Medicare Other | Admitting: Oncology

## 2016-10-12 ENCOUNTER — Other Ambulatory Visit (HOSPITAL_BASED_OUTPATIENT_CLINIC_OR_DEPARTMENT_OTHER): Payer: Medicare Other

## 2016-10-12 ENCOUNTER — Encounter (HOSPITAL_BASED_OUTPATIENT_CLINIC_OR_DEPARTMENT_OTHER): Payer: Medicare Other

## 2016-10-12 ENCOUNTER — Telehealth: Payer: Self-pay | Admitting: Oncology

## 2016-10-12 VITALS — BP 151/52 | HR 63 | Temp 98.4°F | Resp 18 | Ht 70.0 in | Wt 211.5 lb

## 2016-10-12 DIAGNOSIS — N189 Chronic kidney disease, unspecified: Secondary | ICD-10-CM | POA: Diagnosis not present

## 2016-10-12 DIAGNOSIS — Z8546 Personal history of malignant neoplasm of prostate: Secondary | ICD-10-CM | POA: Diagnosis not present

## 2016-10-12 DIAGNOSIS — D649 Anemia, unspecified: Secondary | ICD-10-CM

## 2016-10-12 DIAGNOSIS — D631 Anemia in chronic kidney disease: Secondary | ICD-10-CM | POA: Diagnosis not present

## 2016-10-12 LAB — CBC WITH DIFFERENTIAL/PLATELET
BASO%: 0.8 % (ref 0.0–2.0)
Basophils Absolute: 0 10*3/uL (ref 0.0–0.1)
EOS%: 9.4 % — ABNORMAL HIGH (ref 0.0–7.0)
Eosinophils Absolute: 0.6 10*3/uL — ABNORMAL HIGH (ref 0.0–0.5)
HCT: 28.5 % — ABNORMAL LOW (ref 38.4–49.9)
HGB: 9.4 g/dL — ABNORMAL LOW (ref 13.0–17.1)
LYMPH%: 23.5 % (ref 14.0–49.0)
MCH: 25.6 pg — ABNORMAL LOW (ref 27.2–33.4)
MCHC: 33 g/dL (ref 32.0–36.0)
MCV: 77.6 fL — ABNORMAL LOW (ref 79.3–98.0)
MONO#: 0.8 10*3/uL (ref 0.1–0.9)
MONO%: 12.8 % (ref 0.0–14.0)
NEUT#: 3.2 10*3/uL (ref 1.5–6.5)
NEUT%: 53.5 % (ref 39.0–75.0)
Platelets: 247 10*3/uL (ref 140–400)
RBC: 3.67 10*6/uL — ABNORMAL LOW (ref 4.20–5.82)
RDW: 17.4 % — ABNORMAL HIGH (ref 11.0–14.6)
WBC: 5.9 10*3/uL (ref 4.0–10.3)
lymph#: 1.4 10*3/uL (ref 0.9–3.3)

## 2016-10-12 MED ORDER — DARBEPOETIN ALFA 300 MCG/0.6ML IJ SOSY
300.0000 ug | PREFILLED_SYRINGE | Freq: Once | INTRAMUSCULAR | Status: AC
  Administered 2016-10-12: 300 ug via SUBCUTANEOUS
  Filled 2016-10-12: qty 0.6

## 2016-10-12 NOTE — Progress Notes (Signed)
Hematology and Oncology Follow Up Visit  Matthew Schwartz [id number] [birth year] 83 y.o. 10/12/2016 4:13 PM Matthew Schwartz MDLittle, [name], MD   Principle Diagnosis: 83 year old gentleman with normocytic, normochromic anemia diagnosed in 2013. His hemoglobin have ranged between 10 and 11 during that time. Differential diagnosis includes anemia of chronic disease versus early myelodysplasia.   Prior Therapy: He is status post bone marrow biopsy completed on 09/14/2016 which did not show any evidence of dysplastic features.  Current therapy: Aranesp at 300 g every 3 weeks started on 10/12/2016.  Interim History: [name] presents today for a follow-up visit. Since the last visit, he underwent a bone marrow biopsy and tolerated it without complications. He continues to report some slight fatigue and tiredness but no hematochezia or melena. He did not report any chest pain or difficulty breathing. He denied any falls or syncope. He continues to attend to her activities of daily living. He received his first Aranesp injection today without injection-related complications.  He does not report any headaches, blurry vision, syncope or seizures. He does not report any fevers or chills or sweats. Does not report any cough or hemoptysis or hematemesis. Does not report any nausea, vomiting or abdominal pain. Does not report any diarrhea but certainly he reports constipation. Does not report any hematuria, dysuria or incontinence. He does not report any skeletal complaints. He does report chronic knee pain that have been replaced. Remaining review of systems unremarkable.   Medications: I have reviewed the patient's current medications.  Current Outpatient Prescriptions  Medication Sig Dispense Refill  . amLODipine (NORVASC) 10 MG tablet Take 10 mg by mouth daily.  1  . aspirin 81 MG tablet Take 81 mg by mouth every morning.     Marland Kitchen atorvastatin (LIPITOR) 40 MG tablet Take 40 mg by mouth at bedtime.     .  hydrocortisone valerate ointment (WEST-CORT) 0.2 % Apply 1 application topically 2 (two) times daily.  2  . isosorbide mononitrate (IMDUR) 60 MG 24 hr tablet Take 60 mg by mouth every evening.    Marland Kitchen ketoconazole (NIZORAL) 2 % cream Apply 1 application topically daily as needed for irritation.    Marland Kitchen lactulose (CHRONULAC) 10 GM/15ML solution Take 10 g by mouth as needed for mild constipation.    . metFORMIN (GLUCOPHAGE-XR) 500 MG 24 hr tablet Take 500 mg by mouth 2 (two) times daily.    . metoprolol succinate (TOPROL-XL) 50 MG 24 hr tablet Take 50 mg by mouth 2 (two) times daily.  1  . nizatidine (AXID) 150 MG capsule Take 150 mg by mouth 2 (two) times daily as needed for heartburn.     Marland Kitchen PROCTOSOL HC 2.5 % rectal cream Place 1 application rectally daily as needed for hemorrhoids or itching.     . ramipril (ALTACE) 10 MG capsule Take 10 mg by mouth 2 (two) times daily.      No current facility-administered medications for this visit.      Allergies:  Allergies  Allergen Reactions  . Losartan Potassium Other (See Comments)    Unknown reaction that happened 15 years ago    Past Medical History, Surgical history, Social history, and Family History were reviewed and updated.   Physical Exam: Blood pressure (!) 151/52, pulse 63, temperature 98.4 F (36.9 C), temperature source Oral, resp. rate 18, height '5\' 10"'  (1.778 m), weight 211 lb 8 oz (95.9 kg), SpO2 100 %. ECOG: 1 General appearance: Well-appearing gentleman without distress. Head: Normocephalic, without obvious abnormality no oral ulcers  or lesions. Neck: no adenopathy Lymph nodes: Cervical, supraclavicular, and axillary nodes normal. Heart:regular rate and rhythm, S1, S2 normal, no murmur, click, rub or gallop Lung:chest clear, no wheezing, rales, normal symmetric air entry Abdomin: soft, non-tender, without masses or organomegaly no rebound or guarding. EXT:no edema noted.   Lab Results: Lab Results  Component Value Date    WBC 5.9 10/12/2016   HGB 9.4 (L) 10/12/2016   HCT 28.5 (L) 10/12/2016   MCV 77.6 (L) 10/12/2016   PLT 247 10/12/2016     Chemistry      Component Value Date/Time   NA 137 09/14/2016 0924   NA 137 06/22/2015 1421   K 4.1 09/14/2016 0924   K 4.8 06/22/2015 1421   CL 105 09/14/2016 0924   CO2 24 09/14/2016 0924   CO2 25 06/22/2015 1421   BUN 18 09/14/2016 0924   BUN 17.7 06/22/2015 1421   CREATININE 0.74 09/14/2016 0924   CREATININE 0.9 06/22/2015 1421      Component Value Date/Time   CALCIUM 9.2 09/14/2016 0924   CALCIUM 9.3 06/22/2015 1421   ALKPHOS 71 06/22/2015 1421   AST 19 06/22/2015 1421   ALT 18 06/22/2015 1421   BILITOT 0.40 06/22/2015 1421         Impression and Plan:  83 year old gentleman with the following issues:  1. Normocytic, chronic anemia that have been fluctuating between 10 and 11 since 2013. His hemoglobin has been drifting over the last 5 years. His hemoglobin today is 9.1 which has dropped from 10.9 in February 2017. His workup at that time did not reveal any clear-cut etiology with normal iron studies, serum protein electrophoresis and B12 levels.  His anemia appears to be related to chronic disease and possibly renal insufficiency. He also received radiation therapy in the past which could also be effecting his red cell production.  The plan at this point is to continue Aranesp at 300 g every 3 weeks for a total of 4 injections. I will continue to monitor his iron levels and replace as needed.  2. History of prostate cancer: No evidence of recurrence at this time. He continues to follow with urology regarding this issue.  3. Follow-up: In 3 weeks for his next Aranesp injection and in 2 months for a follow-up visit.   [facility], MD 6/15/20184:13 PM

## 2016-10-12 NOTE — Patient Instructions (Signed)

## 2016-10-12 NOTE — Telephone Encounter (Signed)
Appointments scheduled per 10/12/16 los.  °Patient was given a copy of the AVS report and appointment schedule, per 10/12/16 los. °

## 2016-11-02 ENCOUNTER — Ambulatory Visit (HOSPITAL_BASED_OUTPATIENT_CLINIC_OR_DEPARTMENT_OTHER): Payer: Medicare Other

## 2016-11-02 ENCOUNTER — Other Ambulatory Visit (HOSPITAL_BASED_OUTPATIENT_CLINIC_OR_DEPARTMENT_OTHER): Payer: Medicare Other

## 2016-11-02 VITALS — BP 141/63 | HR 62 | Temp 98.2°F | Resp 18

## 2016-11-02 DIAGNOSIS — D631 Anemia in chronic kidney disease: Secondary | ICD-10-CM

## 2016-11-02 DIAGNOSIS — N189 Chronic kidney disease, unspecified: Secondary | ICD-10-CM

## 2016-11-02 LAB — COMPREHENSIVE METABOLIC PANEL
ALT: 12 U/L (ref 0–55)
AST: 15 U/L (ref 5–34)
Albumin: 3.6 g/dL (ref 3.5–5.0)
Alkaline Phosphatase: 58 U/L (ref 40–150)
Anion Gap: 7 mEq/L (ref 3–11)
BILIRUBIN TOTAL: 0.71 mg/dL (ref 0.20–1.20)
BUN: 16.7 mg/dL (ref 7.0–26.0)
CO2: 27 meq/L (ref 22–29)
Calcium: 9.1 mg/dL (ref 8.4–10.4)
Chloride: 102 mEq/L (ref 98–109)
Creatinine: 0.9 mg/dL (ref 0.7–1.3)
EGFR: 80 mL/min/{1.73_m2} — ABNORMAL LOW (ref >90)
GLUCOSE: 153 mg/dL — AB (ref 70–140)
Potassium: 4.4 mEq/L (ref 3.5–5.1)
SODIUM: 137 meq/L (ref 136–145)
TOTAL PROTEIN: 7 g/dL (ref 6.4–8.3)

## 2016-11-02 LAB — CBC WITH DIFFERENTIAL/PLATELET
BASO%: 0.3 % (ref 0.0–2.0)
BASOS ABS: 0 10*3/uL (ref 0.0–0.1)
EOS%: 6.9 % (ref 0.0–7.0)
Eosinophils Absolute: 0.4 10*3/uL (ref 0.0–0.5)
HCT: 31.3 % — ABNORMAL LOW (ref 38.4–49.9)
HEMOGLOBIN: 10 g/dL — AB (ref 13.0–17.1)
LYMPH%: 21.2 % (ref 14.0–49.0)
MCH: 25.3 pg — ABNORMAL LOW (ref 27.2–33.4)
MCHC: 31.9 g/dL — ABNORMAL LOW (ref 32.0–36.0)
MCV: 79.2 fL — ABNORMAL LOW (ref 79.3–98.0)
MONO#: 0.7 10*3/uL (ref 0.1–0.9)
MONO%: 11.4 % (ref 0.0–14.0)
NEUT%: 60.2 % (ref 39.0–75.0)
NEUTROS ABS: 3.7 10*3/uL (ref 1.5–6.5)
PLATELETS: 230 10*3/uL (ref 140–400)
RBC: 3.95 10*6/uL — ABNORMAL LOW (ref 4.20–5.82)
RDW: 17.6 % — ABNORMAL HIGH (ref 11.0–14.6)
WBC: 6.1 10*3/uL (ref 4.0–10.3)
lymph#: 1.3 10*3/uL (ref 0.9–3.3)

## 2016-11-02 MED ORDER — DARBEPOETIN ALFA 300 MCG/0.6ML IJ SOSY
300.0000 ug | PREFILLED_SYRINGE | Freq: Once | INTRAMUSCULAR | Status: AC
  Administered 2016-11-02: 300 ug via SUBCUTANEOUS
  Filled 2016-11-02: qty 0.6

## 2016-11-02 NOTE — Patient Instructions (Signed)

## 2016-11-05 LAB — IRON AND TIBC
%SAT: 11 % — ABNORMAL LOW (ref 20–55)
Iron: 29 ug/dL — ABNORMAL LOW (ref 42–163)
TIBC: 271 ug/dL (ref 202–409)
UIBC: 242 ug/dL (ref 117–376)

## 2016-11-05 LAB — FERRITIN: Ferritin: 38 ng/ml (ref 22–316)

## 2016-11-23 ENCOUNTER — Other Ambulatory Visit (HOSPITAL_BASED_OUTPATIENT_CLINIC_OR_DEPARTMENT_OTHER): Payer: Medicare Other

## 2016-11-23 ENCOUNTER — Ambulatory Visit (HOSPITAL_BASED_OUTPATIENT_CLINIC_OR_DEPARTMENT_OTHER): Payer: Medicare Other

## 2016-11-23 VITALS — BP 122/55 | HR 63 | Temp 98.2°F | Resp 18

## 2016-11-23 DIAGNOSIS — Z8546 Personal history of malignant neoplasm of prostate: Secondary | ICD-10-CM | POA: Diagnosis not present

## 2016-11-23 DIAGNOSIS — D631 Anemia in chronic kidney disease: Secondary | ICD-10-CM

## 2016-11-23 DIAGNOSIS — N189 Chronic kidney disease, unspecified: Secondary | ICD-10-CM

## 2016-11-23 LAB — CBC WITH DIFFERENTIAL/PLATELET
BASO%: 1.8 % (ref 0.0–2.0)
BASOS ABS: 0.1 10*3/uL (ref 0.0–0.1)
EOS%: 7.3 % — ABNORMAL HIGH (ref 0.0–7.0)
Eosinophils Absolute: 0.4 10*3/uL (ref 0.0–0.5)
HCT: 32.7 % — ABNORMAL LOW (ref 38.4–49.9)
HGB: 10.3 g/dL — ABNORMAL LOW (ref 13.0–17.1)
LYMPH%: 19.5 % (ref 14.0–49.0)
MCH: 24.9 pg — ABNORMAL LOW (ref 27.2–33.4)
MCHC: 31.6 g/dL — ABNORMAL LOW (ref 32.0–36.0)
MCV: 78.8 fL — ABNORMAL LOW (ref 79.3–98.0)
MONO#: 0.7 10*3/uL (ref 0.1–0.9)
MONO%: 11.9 % (ref 0.0–14.0)
NEUT#: 3.4 10*3/uL (ref 1.5–6.5)
NEUT%: 59.5 % (ref 39.0–75.0)
Platelets: 228 10*3/uL (ref 140–400)
RBC: 4.14 10*6/uL — ABNORMAL LOW (ref 4.20–5.82)
RDW: 18.2 % — ABNORMAL HIGH (ref 11.0–14.6)
WBC: 5.7 10*3/uL (ref 4.0–10.3)
lymph#: 1.1 10*3/uL (ref 0.9–3.3)

## 2016-11-23 MED ORDER — DARBEPOETIN ALFA 300 MCG/0.6ML IJ SOSY
300.0000 ug | PREFILLED_SYRINGE | Freq: Once | INTRAMUSCULAR | Status: AC
  Administered 2016-11-23: 300 ug via SUBCUTANEOUS
  Filled 2016-11-23: qty 0.6

## 2016-11-23 NOTE — Patient Instructions (Signed)

## 2016-12-14 ENCOUNTER — Other Ambulatory Visit (HOSPITAL_BASED_OUTPATIENT_CLINIC_OR_DEPARTMENT_OTHER): Payer: Medicare Other

## 2016-12-14 ENCOUNTER — Telehealth: Payer: Self-pay | Admitting: Oncology

## 2016-12-14 ENCOUNTER — Ambulatory Visit: Payer: Medicare Other

## 2016-12-14 ENCOUNTER — Ambulatory Visit (HOSPITAL_BASED_OUTPATIENT_CLINIC_OR_DEPARTMENT_OTHER): Payer: Medicare Other | Admitting: Oncology

## 2016-12-14 VITALS — BP 135/54 | HR 63 | Temp 97.8°F | Resp 18 | Ht 70.0 in | Wt 212.1 lb

## 2016-12-14 DIAGNOSIS — D631 Anemia in chronic kidney disease: Secondary | ICD-10-CM

## 2016-12-14 DIAGNOSIS — N189 Chronic kidney disease, unspecified: Secondary | ICD-10-CM

## 2016-12-14 LAB — CBC WITH DIFFERENTIAL/PLATELET
BASO%: 1 % (ref 0.0–2.0)
BASOS ABS: 0.1 10*3/uL (ref 0.0–0.1)
EOS%: 7.5 % — AB (ref 0.0–7.0)
Eosinophils Absolute: 0.5 10*3/uL (ref 0.0–0.5)
HCT: 34.6 % — ABNORMAL LOW (ref 38.4–49.9)
HGB: 11.2 g/dL — ABNORMAL LOW (ref 13.0–17.1)
LYMPH%: 22.4 % (ref 14.0–49.0)
MCH: 24.8 pg — AB (ref 27.2–33.4)
MCHC: 32.5 g/dL (ref 32.0–36.0)
MCV: 76.4 fL — AB (ref 79.3–98.0)
MONO#: 0.8 10*3/uL (ref 0.1–0.9)
MONO%: 13.1 % (ref 0.0–14.0)
NEUT#: 3.6 10*3/uL (ref 1.5–6.5)
NEUT%: 56 % (ref 39.0–75.0)
Platelets: 258 10*3/uL (ref 140–400)
RBC: 4.52 10*6/uL (ref 4.20–5.82)
RDW: 17.7 % — AB (ref 11.0–14.6)
WBC: 6.5 10*3/uL (ref 4.0–10.3)
lymph#: 1.4 10*3/uL (ref 0.9–3.3)

## 2016-12-14 MED ORDER — FERROUS SULFATE 325 (65 FE) MG PO TABS: 325.0000 mg | ORAL_TABLET | Freq: Every day | ORAL | 3 refills | Status: DC

## 2016-12-14 NOTE — Addendum Note (Signed)
Addended by: Wyatt Portela on: 12/14/2016 03:03 PM   Modules accepted: Orders

## 2016-12-14 NOTE — Progress Notes (Signed)
Hematology and Oncology Follow Up Visit  Matthew Schwartz [id number] 1932-06-04 83 y.o. 12/14/2016 2:54 PM Little, [name] MDLittle, [name], MD   Principle Diagnosis: 83 year old gentleman with normocytic, normochromic anemia diagnosed in 2013. His hemoglobin have ranged between 10 and 11 during that time. Differential diagnosis includes anemia of chronic disease and element of iron deficiency.   Prior Therapy: He is status post bone marrow biopsy completed on 09/14/2016 which did not show any evidence of dysplastic features.  Current therapy: Aranesp at 300 g every 3 weeks started on 10/12/2016. He will receive Aranesp every 4 weeks to keep his hemoglobin above 11.  Interim History: Mr. Plotts presents today for a follow-up visit. Since the last visit, he continues to receive Aranesp and has tolerated it well. His energy is slightly improved at this time although he does have some mild fatigue. He reports no hematochezia or melena. He did not report any chest pain or difficulty breathing. He denied any falls or syncope. He continues to attend to her activities of daily living. He denied any falls or syncope.  He does not report any headaches, blurry vision, syncope or seizures. He does not report any fevers or chills or sweats. Does not report any cough or hemoptysis or hematemesis. Does not report any nausea, vomiting or abdominal pain. Does not report any diarrhea but certainly he reports constipation. Does not report any hematuria, dysuria or incontinence. He does not report any skeletal complaints. He does report chronic knee pain that have been replaced. Remaining review of systems unremarkable.   Medications: I have reviewed the patient's current medications.  Current Outpatient Prescriptions  Medication Sig Dispense Refill  . amLODipine (NORVASC) 10 MG tablet Take 10 mg by mouth daily.  1  . aspirin 81 MG tablet Take 81 mg by mouth every morning.     Marland Kitchen atorvastatin (LIPITOR) 40 MG tablet Take 40  mg by mouth at bedtime.     . hydrocortisone valerate ointment (WEST-CORT) 0.2 % Apply 1 application topically 2 (two) times daily.  2  . isosorbide mononitrate (IMDUR) 60 MG 24 hr tablet Take 60 mg by mouth every evening.    Marland Kitchen ketoconazole (NIZORAL) 2 % cream Apply 1 application topically daily as needed for irritation.    Marland Kitchen lactulose (CHRONULAC) 10 GM/15ML solution Take 10 g by mouth as needed for mild constipation.    . metFORMIN (GLUCOPHAGE-XR) 500 MG 24 hr tablet Take 500 mg by mouth 2 (two) times daily.    . metoprolol succinate (TOPROL-XL) 50 MG 24 hr tablet Take 50 mg by mouth 2 (two) times daily.  1  . nizatidine (AXID) 150 MG capsule Take 150 mg by mouth 2 (two) times daily as needed for heartburn.     Marland Kitchen PROCTOSOL HC 2.5 % rectal cream Place 1 application rectally daily as needed for hemorrhoids or itching.     . ramipril (ALTACE) 10 MG capsule Take 10 mg by mouth 2 (two) times daily.      No current facility-administered medications for this visit.      Allergies:  Allergies  Allergen Reactions  . Losartan Potassium Other (See Comments)    Unknown reaction that happened 15 years ago    Past Medical History, Surgical history, Social history, and Family History were reviewed and updated.   Physical Exam: Blood pressure (!) 135/54, pulse 63, temperature 97.8 F (36.6 C), temperature source Oral, resp. rate 18, height '5\' 10"'  (1.778 m), weight 212 lb 1.6 oz (96.2 kg), SpO2 99 %.  ECOG: 1 General appearance: Alert, awake gentleman without distress. Head: Normocephalic, without obvious abnormality no oral thrush or ulcers. Neck: no adenopathy Lymph nodes: Cervical, supraclavicular, and axillary nodes normal. Heart:regular rate and rhythm, S1, S2 normal, no murmur, click, rub or gallop Lung:chest clear, no wheezing, rales, normal symmetric air entry Abdomin: soft, non-tender, without masses or organomegaly no shifting dullness or ascites. EXT:no edema noted.   Lab  Results: Lab Results  Component Value Date   WBC 6.5 12/14/2016   HGB 11.2 (L) 12/14/2016   HCT 34.6 (L) 12/14/2016   MCV 76.4 (L) 12/14/2016   PLT 258 12/14/2016     Chemistry      Component Value Date/Time   NA 137 11/02/2016 1509   K 4.4 11/02/2016 1509   CL 105 09/14/2016 0924   CO2 27 11/02/2016 1509   BUN 16.7 11/02/2016 1509   CREATININE 0.9 11/02/2016 1509      Component Value Date/Time   CALCIUM 9.1 11/02/2016 1509   ALKPHOS 58 11/02/2016 1509   AST 15 11/02/2016 1509   ALT 12 11/02/2016 1509   BILITOT 0.71 11/02/2016 1509         Impression and Plan:  83 year old gentleman with the following issues:  1. Normocytic, chronic anemia that have been fluctuating between 10 and 11 since 2013. His hemoglobin has been drifting over the last 5 years. His hemoglobin today is 9.1 which has dropped from 10.9 in February 2017. His workup at that time did not reveal any clear-cut etiology with normal iron studies, serum protein electrophoresis and B12 levels.  His anemia appears to be related to chronic disease and renal insufficiency. He also received radiation therapy in the past which could also be effecting his red cell production.  The plan at this point is to continue Aranesp at 300 g every 4 weeks To keep his hemoglobin above 11. He does have an element of iron deficiency and will supplement his iron as well orally.  2. History of prostate cancer: No evidence of recurrence at this time. He continues to follow with urology regarding this issue.  3. Follow-up: In 4 weeks for his next Aranesp injection and in 3 months for a follow-up visit.   Zola Button, MD 8/17/20182:54 PM

## 2016-12-14 NOTE — Telephone Encounter (Signed)
Gave pt avs and calendar for upcoming appts that were scheduled.

## 2017-01-17 ENCOUNTER — Other Ambulatory Visit: Payer: Self-pay | Admitting: *Deleted

## 2017-01-17 DIAGNOSIS — D649 Anemia, unspecified: Secondary | ICD-10-CM

## 2017-01-18 ENCOUNTER — Ambulatory Visit (HOSPITAL_BASED_OUTPATIENT_CLINIC_OR_DEPARTMENT_OTHER): Payer: Medicare Other

## 2017-01-18 ENCOUNTER — Other Ambulatory Visit (HOSPITAL_BASED_OUTPATIENT_CLINIC_OR_DEPARTMENT_OTHER): Payer: Medicare Other

## 2017-01-18 VITALS — BP 129/51 | HR 63 | Temp 98.2°F | Resp 18

## 2017-01-18 DIAGNOSIS — D631 Anemia in chronic kidney disease: Secondary | ICD-10-CM | POA: Diagnosis not present

## 2017-01-18 DIAGNOSIS — N189 Chronic kidney disease, unspecified: Secondary | ICD-10-CM

## 2017-01-18 DIAGNOSIS — D649 Anemia, unspecified: Secondary | ICD-10-CM

## 2017-01-18 LAB — CBC WITH DIFFERENTIAL/PLATELET
BASO%: 0.5 % (ref 0.0–2.0)
Basophils Absolute: 0 10*3/uL (ref 0.0–0.1)
EOS ABS: 0.5 10*3/uL (ref 0.0–0.5)
EOS%: 5.9 % (ref 0.0–7.0)
HEMATOCRIT: 32.2 % — AB (ref 38.4–49.9)
HGB: 10.6 g/dL — ABNORMAL LOW (ref 13.0–17.1)
LYMPH#: 1.5 10*3/uL (ref 0.9–3.3)
LYMPH%: 19.8 % (ref 14.0–49.0)
MCH: 25 pg — ABNORMAL LOW (ref 27.2–33.4)
MCHC: 32.8 g/dL (ref 32.0–36.0)
MCV: 76 fL — AB (ref 79.3–98.0)
MONO#: 0.9 10*3/uL (ref 0.1–0.9)
MONO%: 11.7 % (ref 0.0–14.0)
NEUT%: 62.1 % (ref 39.0–75.0)
NEUTROS ABS: 4.9 10*3/uL (ref 1.5–6.5)
PLATELETS: 240 10*3/uL (ref 140–400)
RBC: 4.23 10*6/uL (ref 4.20–5.82)
RDW: 19.1 % — ABNORMAL HIGH (ref 11.0–14.6)
WBC: 7.8 10*3/uL (ref 4.0–10.3)

## 2017-01-18 MED ORDER — DARBEPOETIN ALFA 300 MCG/0.6ML IJ SOSY
300.0000 ug | PREFILLED_SYRINGE | Freq: Once | INTRAMUSCULAR | Status: AC
  Administered 2017-01-18: 300 ug via SUBCUTANEOUS
  Filled 2017-01-18: qty 0.6

## 2017-01-21 LAB — FERRITIN: Ferritin: 67 ng/ml (ref 22–316)

## 2017-01-21 LAB — IRON AND TIBC
%SAT: 22 % (ref 20–55)
Iron: 52 ug/dL (ref 42–163)
TIBC: 231 ug/dL (ref 202–409)
UIBC: 179 ug/dL (ref 117–376)

## 2017-02-15 ENCOUNTER — Other Ambulatory Visit (HOSPITAL_BASED_OUTPATIENT_CLINIC_OR_DEPARTMENT_OTHER): Payer: Medicare Other

## 2017-02-15 ENCOUNTER — Ambulatory Visit (HOSPITAL_BASED_OUTPATIENT_CLINIC_OR_DEPARTMENT_OTHER): Payer: Medicare Other

## 2017-02-15 VITALS — BP 128/60 | HR 71 | Temp 97.5°F | Resp 70

## 2017-02-15 DIAGNOSIS — N189 Chronic kidney disease, unspecified: Secondary | ICD-10-CM | POA: Diagnosis not present

## 2017-02-15 DIAGNOSIS — D631 Anemia in chronic kidney disease: Secondary | ICD-10-CM

## 2017-02-15 LAB — FERRITIN: FERRITIN: 99 ng/mL (ref 22–316)

## 2017-02-15 LAB — IRON AND TIBC
%SAT: 22 % (ref 20–55)
Iron: 52 ug/dL (ref 42–163)
TIBC: 236 ug/dL (ref 202–409)
UIBC: 184 ug/dL (ref 117–376)

## 2017-02-15 LAB — CBC WITH DIFFERENTIAL/PLATELET
BASO%: 0.3 % (ref 0.0–2.0)
Basophils Absolute: 0 10*3/uL (ref 0.0–0.1)
EOS%: 6.6 % (ref 0.0–7.0)
Eosinophils Absolute: 0.5 10*3/uL (ref 0.0–0.5)
HCT: 33.8 % — ABNORMAL LOW (ref 38.4–49.9)
HGB: 10.9 g/dL — ABNORMAL LOW (ref 13.0–17.1)
LYMPH%: 18.5 % (ref 14.0–49.0)
MCH: 25.6 pg — ABNORMAL LOW (ref 27.2–33.4)
MCHC: 32.2 g/dL (ref 32.0–36.0)
MCV: 79.5 fL (ref 79.3–98.0)
MONO#: 0.9 10*3/uL (ref 0.1–0.9)
MONO%: 11.3 % (ref 0.0–14.0)
NEUT#: 4.9 10*3/uL (ref 1.5–6.5)
NEUT%: 63.3 % (ref 39.0–75.0)
PLATELETS: 230 10*3/uL (ref 140–400)
RBC: 4.25 10*6/uL (ref 4.20–5.82)
RDW: 20.2 % — ABNORMAL HIGH (ref 11.0–14.6)
WBC: 7.7 10*3/uL (ref 4.0–10.3)
lymph#: 1.4 10*3/uL (ref 0.9–3.3)

## 2017-02-15 MED ORDER — DARBEPOETIN ALFA 300 MCG/0.6ML IJ SOSY
300.0000 ug | PREFILLED_SYRINGE | Freq: Once | INTRAMUSCULAR | Status: AC
  Administered 2017-02-15: 300 ug via SUBCUTANEOUS
  Filled 2017-02-15: qty 0.6

## 2017-02-15 NOTE — Patient Instructions (Signed)

## 2017-03-14 ENCOUNTER — Other Ambulatory Visit: Payer: Self-pay | Admitting: *Deleted

## 2017-03-14 DIAGNOSIS — D649 Anemia, unspecified: Secondary | ICD-10-CM

## 2017-03-15 ENCOUNTER — Other Ambulatory Visit (HOSPITAL_BASED_OUTPATIENT_CLINIC_OR_DEPARTMENT_OTHER): Payer: Medicare Other

## 2017-03-15 ENCOUNTER — Telehealth: Payer: Self-pay

## 2017-03-15 ENCOUNTER — Ambulatory Visit (HOSPITAL_BASED_OUTPATIENT_CLINIC_OR_DEPARTMENT_OTHER): Payer: Medicare Other | Admitting: Oncology

## 2017-03-15 ENCOUNTER — Ambulatory Visit (HOSPITAL_BASED_OUTPATIENT_CLINIC_OR_DEPARTMENT_OTHER): Payer: Medicare Other

## 2017-03-15 VITALS — BP 144/58 | HR 83 | Temp 98.6°F | Resp 18 | Ht 70.0 in | Wt 218.5 lb

## 2017-03-15 DIAGNOSIS — Z8546 Personal history of malignant neoplasm of prostate: Secondary | ICD-10-CM

## 2017-03-15 DIAGNOSIS — N189 Chronic kidney disease, unspecified: Principal | ICD-10-CM

## 2017-03-15 DIAGNOSIS — D649 Anemia, unspecified: Secondary | ICD-10-CM

## 2017-03-15 DIAGNOSIS — D631 Anemia in chronic kidney disease: Secondary | ICD-10-CM

## 2017-03-15 LAB — CBC WITH DIFFERENTIAL/PLATELET
BASO%: 0.5 % (ref 0.0–2.0)
BASOS ABS: 0 10*3/uL (ref 0.0–0.1)
EOS ABS: 0.5 10*3/uL (ref 0.0–0.5)
EOS%: 7.3 % — AB (ref 0.0–7.0)
HEMATOCRIT: 32.7 % — AB (ref 38.4–49.9)
HEMOGLOBIN: 10.6 g/dL — AB (ref 13.0–17.1)
LYMPH%: 26.7 % (ref 14.0–49.0)
MCH: 26.4 pg — AB (ref 27.2–33.4)
MCHC: 32.5 g/dL (ref 32.0–36.0)
MCV: 81.3 fL (ref 79.3–98.0)
MONO#: 0.8 10*3/uL (ref 0.1–0.9)
MONO%: 11.8 % (ref 0.0–14.0)
NEUT#: 3.5 10*3/uL (ref 1.5–6.5)
NEUT%: 53.7 % (ref 39.0–75.0)
Platelets: 228 10*3/uL (ref 140–400)
RBC: 4.02 10*6/uL — ABNORMAL LOW (ref 4.20–5.82)
RDW: 19.3 % — AB (ref 11.0–14.6)
WBC: 6.5 10*3/uL (ref 4.0–10.3)
lymph#: 1.7 10*3/uL (ref 0.9–3.3)

## 2017-03-15 MED ORDER — DARBEPOETIN ALFA 300 MCG/0.6ML IJ SOSY
300.0000 ug | PREFILLED_SYRINGE | Freq: Once | INTRAMUSCULAR | Status: AC
  Administered 2017-03-15: 300 ug via SUBCUTANEOUS
  Filled 2017-03-15: qty 0.6

## 2017-03-15 NOTE — Progress Notes (Signed)
Hematology and Oncology Follow Up Visit  Matthew Schwartz [id number] [birth year] 83 y.o. 03/15/2017 3:37 PM [name], [name] MDLittle, [name], MD   Principle Diagnosis: 83 year old gentleman with normocytic, normochromic anemia diagnosed in 2013. His hemoglobin have ranged between 10 and 11 during that time. Differential diagnosis includes anemia of chronic disease and element of iron deficiency.   Prior Therapy: He is status post bone marrow biopsy completed on 09/14/2016 which did not show any evidence of dysplastic features.  Current therapy: Aranesp at 300 g every 4 weeks started on 10/12/2016. He will receive Aranesp every 4 weeks tell 02/2017.  He will be on a treatment break after that.  Interim History: [name] presents today for a follow-up visit. Since the last visit, he reports no changes. He continues to receive Aranesp and has tolerated it well. His energy is uncharged at this time. He reports no hematochezia or melena. He did not report any chest pain or difficulty breathing. He denied any falls or syncope. He continues to attend to her activities of daily living. He denied any falls or syncope.  He had difficulties with with symptoms of constipation and dyspepsia.  He does not report any headaches, blurry vision, syncope or seizures. He does not report any fevers or chills or sweats. Does not report any cough or hemoptysis or hematemesis. Does not report any nausea, vomiting or abdominal pain. Does not report any diarrhea but certainly he reports constipation. Does not report any hematuria, dysuria or incontinence. He does not report any skeletal complaints. He does report chronic knee pain that have been replaced. Remaining review of systems unremarkable.   Medications: I have reviewed the patient's current medications.  Current Outpatient Medications  Medication Sig Dispense Refill  . amLODipine (NORVASC) 10 MG tablet Take 10 mg by mouth daily.  1  . aspirin 81 MG tablet Take 81 mg by  mouth every morning.     Marland Kitchen atorvastatin (LIPITOR) 40 MG tablet Take 40 mg by mouth at bedtime.     . ferrous sulfate 325 (65 FE) MG tablet Take 1 tablet (325 mg total) by mouth daily with breakfast. 90 tablet 3  . hydrocortisone valerate ointment (WEST-CORT) 0.2 % Apply 1 application topically 2 (two) times daily.  2  . isosorbide mononitrate (IMDUR) 60 MG 24 hr tablet Take 60 mg by mouth every evening.    Marland Kitchen ketoconazole (NIZORAL) 2 % cream Apply 1 application topically daily as needed for irritation.    Marland Kitchen lactulose (CHRONULAC) 10 GM/15ML solution Take 10 g by mouth as needed for mild constipation.    . metFORMIN (GLUCOPHAGE-XR) 500 MG 24 hr tablet Take 500 mg by mouth 2 (two) times daily.    . metoprolol succinate (TOPROL-XL) 50 MG 24 hr tablet Take 50 mg by mouth 2 (two) times daily.  1  . nizatidine (AXID) 150 MG capsule Take 150 mg by mouth 2 (two) times daily as needed for heartburn.     Marland Kitchen PROCTOSOL HC 2.5 % rectal cream Place 1 application rectally daily as needed for hemorrhoids or itching.     . ramipril (ALTACE) 10 MG capsule Take 10 mg by mouth 2 (two) times daily.      No current facility-administered medications for this visit.    Facility-Administered Medications Ordered in Other Visits  Medication Dose Route Frequency Provider Last Rate Last Dose  . Darbepoetin Alfa (ARANESP) injection 300 mcg  300 mcg Subcutaneous Once Wyatt Portela, MD  Allergies:  Allergies  Allergen Reactions  . Losartan Potassium Other (See Comments)    Unknown reaction that happened 15 years ago    Past Medical History, Surgical history, Social history, and Family History were reviewed and updated.   Physical Exam: Blood pressure (!) 144/58, pulse 83, temperature 98.6 F (37 C), temperature source Oral, resp. rate 18, height '5\' 10"'  (1.778 m), weight 218 lb 8 oz (99.1 kg), SpO2 100 %. ECOG: 1 General appearance: Well-appearing gentleman without distress. Head: Normocephalic, without  obvious abnormality no oral thrush or ulcers. Neck: no adenopathy Lymph nodes: Cervical, supraclavicular, and axillary nodes normal. Heart:regular rate and rhythm, S1, S2 normal, no murmur, click, rub or gallop Lung:chest clear, no wheezing, rales, normal symmetric air entry Abdomin: soft, non-tender, without masses or organomegaly no rebound or guarding. EXT:no edema noted.   Lab Results: Lab Results  Component Value Date   WBC 6.5 03/15/2017   HGB 10.6 (L) 03/15/2017   HCT 32.7 (L) 03/15/2017   MCV 81.3 03/15/2017   PLT 228 03/15/2017     Chemistry      Component Value Date/Time   NA 137 11/02/2016 1509   K 4.4 11/02/2016 1509   CL 105 09/14/2016 0924   CO2 27 11/02/2016 1509   BUN 16.7 11/02/2016 1509   CREATININE 0.9 11/02/2016 1509      Component Value Date/Time   CALCIUM 9.1 11/02/2016 1509   ALKPHOS 58 11/02/2016 1509   AST 15 11/02/2016 1509   ALT 12 11/02/2016 1509   BILITOT 0.71 11/02/2016 1509         Impression and Plan:  83 year old gentleman with the following issues:  1. Normocytic, chronic anemia that have been fluctuating between 10 and 11 since 2013. His hemoglobin has been drifting over the last 5 years. His hemoglobin today is 9.1 which has dropped from 10.9 in February 2017. His workup at that time did not reveal any clear-cut etiology with normal iron studies, serum protein electrophoresis and B12 levels.  His anemia is related to chronic disease and renal insufficiency. He also received radiation therapy in the past which could also be effecting his red cell production.  The plan at this point is to continue Aranesp at 300 g every 4 weeks for the time being.  He is requesting to stop this treatment at this time because of financial reasons.  I recommended stopping Aranesp and continue to monitor his counts periodically.  If his hemoglobin drifts below 9, we could restart to keep his hemoglobin above 10.  2. History of prostate cancer: No  evidence of recurrence at this time. He continues to follow with urology regarding this issue.  3.  Iron deficiency: His iron stores are adequate and have recommended discontinuation.  He reports complications related to a replacement at this time.  4. Follow-up: To recheck his hemoglobin.  He will have MD follow-up in 3 months.   Zola Button, MD 11/16/20183:37 PM

## 2017-03-15 NOTE — Telephone Encounter (Signed)
Printed avs and calender for upcoming appointment per 11/16 los

## 2017-03-15 NOTE — Patient Instructions (Signed)

## 2017-04-12 ENCOUNTER — Other Ambulatory Visit (HOSPITAL_BASED_OUTPATIENT_CLINIC_OR_DEPARTMENT_OTHER): Payer: Medicare Other

## 2017-04-12 DIAGNOSIS — N189 Chronic kidney disease, unspecified: Secondary | ICD-10-CM | POA: Diagnosis not present

## 2017-04-12 DIAGNOSIS — D631 Anemia in chronic kidney disease: Secondary | ICD-10-CM | POA: Diagnosis not present

## 2017-04-12 DIAGNOSIS — Z8546 Personal history of malignant neoplasm of prostate: Secondary | ICD-10-CM

## 2017-04-12 LAB — CBC WITH DIFFERENTIAL/PLATELET
BASO%: 0.4 % (ref 0.0–2.0)
Basophils Absolute: 0 10*3/uL (ref 0.0–0.1)
EOS ABS: 0.4 10*3/uL (ref 0.0–0.5)
EOS%: 7.4 % — ABNORMAL HIGH (ref 0.0–7.0)
HEMATOCRIT: 33.8 % — AB (ref 38.4–49.9)
HGB: 10.8 g/dL — ABNORMAL LOW (ref 13.0–17.1)
LYMPH%: 21.6 % (ref 14.0–49.0)
MCH: 26.9 pg — ABNORMAL LOW (ref 27.2–33.4)
MCHC: 32 g/dL (ref 32.0–36.0)
MCV: 84.1 fL (ref 79.3–98.0)
MONO#: 0.9 10*3/uL (ref 0.1–0.9)
MONO%: 15.9 % — ABNORMAL HIGH (ref 0.0–14.0)
NEUT%: 54.7 % (ref 39.0–75.0)
NEUTROS ABS: 3.1 10*3/uL (ref 1.5–6.5)
NRBC: 0 % (ref 0–0)
PLATELETS: ADEQUATE 10*3/uL (ref 140–400)
RBC: 4.02 10*6/uL — ABNORMAL LOW (ref 4.20–5.82)
RDW: 16.4 % — ABNORMAL HIGH (ref 11.0–14.6)
WBC: 5.7 10*3/uL (ref 4.0–10.3)
lymph#: 1.2 10*3/uL (ref 0.9–3.3)

## 2017-04-19 ENCOUNTER — Encounter: Payer: Self-pay | Admitting: Cardiology

## 2017-04-19 ENCOUNTER — Ambulatory Visit (INDEPENDENT_AMBULATORY_CARE_PROVIDER_SITE_OTHER): Payer: Medicare Other | Admitting: Cardiology

## 2017-04-19 VITALS — BP 112/70 | HR 56 | Ht 70.0 in | Wt 214.8 lb

## 2017-04-19 DIAGNOSIS — I10 Essential (primary) hypertension: Secondary | ICD-10-CM | POA: Diagnosis not present

## 2017-04-19 DIAGNOSIS — E7800 Pure hypercholesterolemia, unspecified: Secondary | ICD-10-CM

## 2017-04-19 DIAGNOSIS — I2510 Atherosclerotic heart disease of native coronary artery without angina pectoris: Secondary | ICD-10-CM

## 2017-04-19 DIAGNOSIS — I2583 Coronary atherosclerosis due to lipid rich plaque: Secondary | ICD-10-CM

## 2017-04-19 NOTE — Progress Notes (Signed)
EKG

## 2017-04-19 NOTE — Patient Instructions (Signed)
Medication Instructions:  Your physician recommends that you continue on your current medications as directed. Please refer to the Current Medication list given to you today.   Labwork: NONE ORDERED TODAY  Testing/Procedures: NONE ORDERED TODAY  Follow-Up: DR. Marlou Porch IN 1 YEAR; WE WILL SEND OUT A REMINDER LETTER IN ABOUT 8-9 MONTHS TO HAVE YOU CALL AND MAKE AN APPT WITH DR. Marlou Porch   Any Other Special Instructions Will Be Listed Below (If Applicable).     If you need a refill on your cardiac medications before your next appointment, please call your pharmacy.

## 2017-04-19 NOTE — Progress Notes (Signed)
Cardiology Office Note:    Date:  04/19/2017   ID:  Matthew Schwartz, DOB [birth year], MRN [id number]  PCP:  [name], MD  Cardiologist:  Candee Furbish, MD    Referring MD: [name], MD     History of Present Illness:    Matthew Schwartz is a 83 y.o. male with a hx of PTCA in 2000, CAD with balloon angioplasty only to RCA, diabetes, hypertension, hyperlipidemia here for follow-up.  Prior ejection fraction was normal.  Dr. Leonia Reeves previously treated him and he was very thankful for his service.  Prior stress test in 2007 showed mild to moderate ischemia in the lateral region.  Cardiac catheterization resulted in balloon angioplasty RCA, 2000.  His daughter was a Advertising copywriter and did not want Dr. Leonia Reeves to place a stent he states.  He has been doing well.  He is vegetarian.  He tries to stay active.  He uses his cane because of our parking lot he states.  No chest pain fevers chills nausea vomiting syncope.  Past Medical History:  Diagnosis Date  . Diabetes mellitus without complication ([place])   . Dyslipidemia   . GERD (gastroesophageal reflux disease)   . Hypertension   . Prostate cancer ([place]) 12/23/13   Adenocarcinoma  . PSA elevation   . S/P radiation therapy 05/18/2014 through 07/16/2014    Prostate 7800 cGy in 40 sessions, seminal vesicles 5600 cGy in 40 sessions     Past Surgical History:  Procedure Laterality Date  . ANGIOPLASTY  2000  . cataract surgery  2000  . COLONOSCOPY    . PROSTATE BIOPSY  12/23/13  . REPLACEMENT TOTAL KNEE  2008/2009   bilateral    Current Medications: Current Meds  Medication Sig  . amLODipine (NORVASC) 10 MG tablet Take 10 mg by mouth daily.  Marland Kitchen aspirin 81 MG tablet Take 81 mg by mouth every morning.   Marland Kitchen atorvastatin (LIPITOR) 40 MG tablet Take 40 mg by mouth at bedtime.   . hydrocortisone valerate ointment (WEST-CORT) 0.2 % Apply 1 application topically 2 (two)  times daily.  . isosorbide mononitrate (IMDUR) 60 MG 24 hr tablet Take 60 mg by mouth every evening.  Marland Kitchen ketoconazole (NIZORAL) 2 % cream Apply 1 application topically daily as needed for irritation.  Marland Kitchen lactulose (CHRONULAC) 10 GM/15ML solution Take 10 g by mouth as needed for mild constipation.  . metFORMIN (GLUCOPHAGE-XR) 500 MG 24 hr tablet Take 500 mg by mouth 2 (two) times daily.  . metoprolol succinate (TOPROL-XL) 50 MG 24 hr tablet Take 50 mg by mouth 2 (two) times daily.  . nizatidine (AXID) 150 MG capsule Take 150 mg by mouth 2 (two) times daily as needed for heartburn.   Marland Kitchen PROCTOSOL HC 2.5 % rectal cream Place 1 application rectally daily as needed for hemorrhoids or itching.   . ramipril (ALTACE) 10 MG capsule Take 10 mg by mouth 2 (two) times daily.   Marland Kitchen SHINGRIX injection Inject as directed once.     Allergies:   Losartan potassium   Social History   Socioeconomic History  . Marital status: Married    Spouse name: None  . Number of children: None  . Years of education: None  . Highest education level: None  Social Needs  . Financial resource strain: None  . Food insecurity - worry: None  . Food insecurity - inability: None  . Transportation needs - medical: None  . Transportation needs - non-medical: None  Occupational History  . Occupation: retired  Tobacco Use  . Smoking status: Former Smoker    Last attempt to quit: 06/14/1994    Years since quitting: 22.8  . Smokeless tobacco: Never Used  Substance and Sexual Activity  . Alcohol use: No  . Drug use: No  . Sexual activity: None  Other Topics Concern  . None  Social History Narrative  . None     Family History: The patient's family history includes CVA in his mother; Coronary artery disease in his brother, brother, and sister; Heart disease in his brother, brother, and sister. ROS:   Please see the history of present illness.     All other systems reviewed and are negative.  EKGs/Labs/Other Studies  Reviewed:    The following studies were reviewed today:  ABIs reassuring, normal.  EKG:  EKG is  ordered today.  The ekg ordered today demonstrates sinus bradycardia rate 56 with incomplete right bundle branch block personally viewed  Recent Labs: 11/02/2016: ALT 12; BUN 16.7; Creatinine 0.9; Potassium 4.4; Sodium 137 04/12/2017: HGB 10.8; Platelets Clumped Platelets--Appears Adequate  Recent Lipid Panel No results found for: CHOL, TRIG, HDL, CHOLHDL, VLDL, LDLCALC, LDLDIRECT  Physical Exam:    VS:  BP 112/70   Pulse (!) 56   Ht 5\' 10"  (1.778 m)   Wt 214 lb 12.8 oz (97.4 kg)   SpO2 98%   BMI 30.82 kg/m     Wt Readings from Last 3 Encounters:  04/19/17 214 lb 12.8 oz (97.4 kg)  03/15/17 218 lb 8 oz (99.1 kg)  12/14/16 212 lb 1.6 oz (96.2 kg)     GEN:  Well nourished, well developed in no acute distress HEENT: Normal NECK: No JVD; No carotid bruits LYMPHATICS: No lymphadenopathy CARDIAC: RRR, no murmurs, rubs, gallops RESPIRATORY:  Clear to auscultation without rales, wheezing or rhonchi  ABDOMEN: Soft, non-tender, non-distended MUSCULOSKELETAL:  No edema; No deformity  SKIN: Warm and dry NEUROLOGIC:  Alert and oriented x 3 PSYCHIATRIC:  Normal affect   ASSESSMENT:    1. Coronary artery disease due to lipid rich plaque   2. Essential hypertension   3. Pure hypercholesterolemia    PLAN:    In order of problems listed above:  Coronary artery disease -PTCA of RCA several years ago, doing well, no anginal symptoms currently.  Continue with aggressive secondary prevention.  Imdur 60.  Doing well.  Hyperlipidemia - On atorvastatin 40.  LDL 70.  Excellent.  Essential hypertension - Overall doing very well.  No changes in medication.  Dr. Rex Kras.  One year follow-up.  Medication Adjustments/Labs and Tests Ordered: Current medicines are reviewed at length with the patient today.  Concerns regarding medicines are outlined above.  Orders Placed This Encounter    Procedures  . EKG 12-Lead   No orders of the defined types were placed in this encounter.   Signed, Candee Furbish, MD  04/19/2017 10:25 AM    [facility]

## 2017-05-10 ENCOUNTER — Inpatient Hospital Stay: Payer: Medicare Other | Attending: Oncology

## 2017-05-10 DIAGNOSIS — Z8546 Personal history of malignant neoplasm of prostate: Secondary | ICD-10-CM | POA: Diagnosis not present

## 2017-05-10 DIAGNOSIS — N189 Chronic kidney disease, unspecified: Secondary | ICD-10-CM | POA: Insufficient documentation

## 2017-05-10 DIAGNOSIS — D631 Anemia in chronic kidney disease: Secondary | ICD-10-CM | POA: Insufficient documentation

## 2017-05-10 LAB — CBC WITH DIFFERENTIAL/PLATELET
Basophils Absolute: 0 10*3/uL (ref 0.0–0.1)
Basophils Relative: 0 %
Eosinophils Absolute: 0.7 10*3/uL — ABNORMAL HIGH (ref 0.0–0.5)
Eosinophils Relative: 10 %
HEMATOCRIT: 33.9 % — AB (ref 38.4–49.9)
HEMOGLOBIN: 10.9 g/dL — AB (ref 13.0–17.1)
LYMPHS ABS: 1.7 10*3/uL (ref 0.9–3.3)
LYMPHS PCT: 25 %
MCH: 26.8 pg — AB (ref 27.2–33.4)
MCHC: 32.2 g/dL (ref 32.0–36.0)
MCV: 83.5 fL (ref 79.3–98.0)
MONOS PCT: 10 %
Monocytes Absolute: 0.7 10*3/uL (ref 0.1–0.9)
NEUTROS ABS: 3.7 10*3/uL (ref 1.5–6.5)
Neutrophils Relative %: 55 %
Platelets: 218 10*3/uL (ref 140–400)
RBC: 4.06 MIL/uL — ABNORMAL LOW (ref 4.20–5.82)
RDW: 16.3 % — ABNORMAL HIGH (ref 11.0–15.6)
WBC: 6.8 10*3/uL (ref 4.0–10.3)

## 2017-05-13 LAB — IRON AND TIBC
Iron: 60 ug/dL (ref 42–163)
SATURATION RATIOS: 27 % — AB (ref 42–163)
TIBC: 221 ug/dL (ref 202–409)
UIBC: 161 ug/dL

## 2017-05-13 LAB — FERRITIN: Ferritin: 83 ng/mL (ref 22–316)

## 2017-06-21 ENCOUNTER — Telehealth: Payer: Self-pay | Admitting: Oncology

## 2017-06-21 ENCOUNTER — Inpatient Hospital Stay: Payer: Medicare Other | Attending: Oncology | Admitting: Oncology

## 2017-06-21 ENCOUNTER — Inpatient Hospital Stay: Payer: Medicare Other

## 2017-06-21 VITALS — BP 137/66 | HR 69 | Temp 98.6°F | Resp 18 | Ht 70.0 in | Wt 213.9 lb

## 2017-06-21 DIAGNOSIS — Z923 Personal history of irradiation: Secondary | ICD-10-CM

## 2017-06-21 DIAGNOSIS — E611 Iron deficiency: Secondary | ICD-10-CM | POA: Insufficient documentation

## 2017-06-21 DIAGNOSIS — D631 Anemia in chronic kidney disease: Secondary | ICD-10-CM | POA: Insufficient documentation

## 2017-06-21 DIAGNOSIS — N189 Chronic kidney disease, unspecified: Secondary | ICD-10-CM

## 2017-06-21 DIAGNOSIS — Z8546 Personal history of malignant neoplasm of prostate: Secondary | ICD-10-CM

## 2017-06-21 DIAGNOSIS — C61 Malignant neoplasm of prostate: Secondary | ICD-10-CM

## 2017-06-21 LAB — CBC WITH DIFFERENTIAL/PLATELET
Basophils Absolute: 0 10*3/uL (ref 0.0–0.1)
Basophils Relative: 1 %
EOS ABS: 0.4 10*3/uL (ref 0.0–0.5)
Eosinophils Relative: 7 %
HCT: 31.9 % — ABNORMAL LOW (ref 38.4–49.9)
HEMOGLOBIN: 10.3 g/dL — AB (ref 13.0–17.1)
LYMPHS ABS: 1.6 10*3/uL (ref 0.9–3.3)
Lymphocytes Relative: 29 %
MCH: 26.4 pg — AB (ref 27.2–33.4)
MCHC: 32.4 g/dL (ref 32.0–36.0)
MCV: 81.6 fL (ref 79.3–98.0)
MONOS PCT: 11 %
Monocytes Absolute: 0.6 10*3/uL (ref 0.1–0.9)
NEUTROS PCT: 52 %
Neutro Abs: 3 10*3/uL (ref 1.5–6.5)
Platelets: 238 10*3/uL (ref 140–400)
RBC: 3.9 MIL/uL — ABNORMAL LOW (ref 4.20–5.82)
RDW: 16.2 % — ABNORMAL HIGH (ref 11.0–14.6)
WBC: 5.7 10*3/uL (ref 4.0–10.3)

## 2017-06-21 NOTE — Telephone Encounter (Signed)
Scheduled appt per 2/22 los - Gave patient AVS and calender per los.  

## 2017-06-21 NOTE — Progress Notes (Signed)
Hematology and Oncology Follow Up Visit  Matthew Schwartz [id number] [birth year] 84 y.o. 06/21/2017 3:35 PM Matthew Schwartz, Matthew Schwartz MDLittle, Matthew Bihari, MD   Principle Diagnosis: 84 year old man with multifactorial anemia related to renal disease and chronic disease.  Diagnosed in 2013.  Normocytic, normochromic anemia diagnosed in 2013.    Prior Therapy:   He is status post bone marrow biopsy completed on 09/14/2016 which did not show any evidence of dysplastic features.  Aranesp at 300 g every 4 weeks started on 10/12/2016.  His last injection was in 02/2017.     Current therapy: Active surveillance.  Interim History: Matthew Schwartz is here for a follow-up visit.  Since the last visit, he has not reported any major changes in his health.  He denies any further decline in his performance status or activity level.  He denies any dyspnea on exertion or progressive weakness.  He still able to attend to activities of daily living although his mobility is slow.  He denies any arthralgias or myalgias.  He denies any recent hospitalization or  He does not report any headaches, blurry vision, syncope or seizures. He does not report any fevers or chills or sweats. Does not report any cough or hemoptysis or hematemesis. Does not report any nausea, vomiting or abdominal pain. Does not report any diarrhea but certainly he reports constipation. Does not report any hematuria, dysuria or incontinence. He does not report any skeletal complaints. Remaining review of systems is negative.  Medications: I have reviewed the patient's current medications.  Current Outpatient Medications  Medication Sig Dispense Refill  . amLODipine (NORVASC) 10 MG tablet Take 10 mg by mouth daily.  1  . aspirin 81 MG tablet Take 81 mg by mouth every morning.     Marland Kitchen atorvastatin (LIPITOR) 40 MG tablet Take 40 mg by mouth at bedtime.     . hydrocortisone valerate ointment (WEST-CORT) 0.2 % Apply 1 application topically 2 (two) times daily.  2  .  isosorbide mononitrate (IMDUR) 60 MG 24 hr tablet Take 60 mg by mouth every evening.    Marland Kitchen ketoconazole (NIZORAL) 2 % cream Apply 1 application topically daily as needed for irritation.    Marland Kitchen lactulose (CHRONULAC) 10 GM/15ML solution Take 10 g by mouth as needed for mild constipation.    . metFORMIN (GLUCOPHAGE-XR) 500 MG 24 hr tablet Take 500 mg by mouth 2 (two) times daily.    . metoprolol succinate (TOPROL-XL) 50 MG 24 hr tablet Take 50 mg by mouth 2 (two) times daily.  1  . nizatidine (AXID) 150 MG capsule Take 150 mg by mouth 2 (two) times daily as needed for heartburn.     Marland Kitchen PROCTOSOL HC 2.5 % rectal cream Place 1 application rectally daily as needed for hemorrhoids or itching.     . ramipril (ALTACE) 10 MG capsule Take 10 mg by mouth 2 (two) times daily.     Marland Kitchen SHINGRIX injection Inject as directed once.     No current facility-administered medications for this visit.      Allergies:  Allergies  Allergen Reactions  . Losartan Potassium Other (See Comments)    Unknown reaction that happened 15 years ago    Past Medical History, Surgical history, Social history, and Family History were reviewed and updated.   Physical Exam: Blood pressure 137/66, pulse 69, temperature 98.6 F (37 C), temperature source Oral, resp. rate 18, height 5' 10" (1.778 m), weight 213 lb 14.4 oz (97 kg), SpO2 100 %. ECOG: 1 General appearance: Alert,  awake gentleman appeared comfortable. Head: Normocephalic, without obvious abnormality  Oropharynx: No oral thrush or ulcers. Neck: no adenopathy Lymph nodes: Cervical, supraclavicular, and axillary nodes normal. Heart:regular rate and rhythm, S1, S2 normal, no murmur, click, rub or gallop Lung: Clear to auscultation without rhonchi, wheezes or dullness to percussion. Abdomin: Soft, nontender without any rebound or guarding. Musculoskeletal: No joint deformity or effusion. Neurological: No deficits noted motor, sensory or deep tendon reflexes.   Lab  Results: Lab Results  Component Value Date   WBC 5.7 06/21/2017   HGB 10.3 (L) 06/21/2017   HCT 31.9 (L) 06/21/2017   MCV 81.6 06/21/2017   PLT 238 06/21/2017     Chemistry      Component Value Date/Time   NA 137 11/02/2016 1509   K 4.4 11/02/2016 1509   CL 105 09/14/2016 0924   CO2 27 11/02/2016 1509   BUN 16.7 11/02/2016 1509   CREATININE 0.9 11/02/2016 1509      Component Value Date/Time   CALCIUM 9.1 11/02/2016 1509   ALKPHOS 58 11/02/2016 1509   AST 15 11/02/2016 1509   ALT 12 11/02/2016 1509   BILITOT 0.71 11/02/2016 1509         Impression and Plan:  84 year old gentleman with the following issues:  1.  Multifactorial anemia with anemia of chronic disease as well as renal insufficiency.  His workup has been unrevealing for myelodysplastic syndrome including a bone marrow biopsy.  He received Aranesp 300 mcg without any significant changes in his symptoms or hemoglobin.  His hemoglobin today is not dramatically changed off Aranesp.  Risks and benefits of continuing this medication was discussed today and he opted to withhold treatment for the time being.  He denies any significant improvement in his overall quality of life and his hemoglobin did not dramatically change.  After discussion today, we have elected to continue with observation and surveillance and reinstitute Aranesp if needed to in the future.  2. History of prostate cancer: He received radiation therapy in the past without any evidence of recurrent disease.  3.  Iron deficiency: Iron levels will be checked periodically and we will replace as needed.  4. Follow-up: We will be in 4 months to follow his progress.  15  minutes was spent with the patient face-to-face today.  More than 50% of time was dedicated to patient counseling, education and coordination of his multifaceted care.    Zola Button, MD 2/22/20193:35 PM

## 2017-10-18 ENCOUNTER — Inpatient Hospital Stay: Payer: Medicare Other | Attending: Oncology | Admitting: Oncology

## 2017-10-18 ENCOUNTER — Inpatient Hospital Stay: Payer: Medicare Other

## 2017-10-18 ENCOUNTER — Telehealth: Payer: Self-pay

## 2017-10-18 VITALS — BP 119/56 | HR 81 | Temp 98.7°F | Resp 17 | Ht 70.0 in | Wt 215.5 lb

## 2017-10-18 DIAGNOSIS — Z8546 Personal history of malignant neoplasm of prostate: Secondary | ICD-10-CM | POA: Diagnosis not present

## 2017-10-18 DIAGNOSIS — C61 Malignant neoplasm of prostate: Secondary | ICD-10-CM

## 2017-10-18 DIAGNOSIS — D631 Anemia in chronic kidney disease: Secondary | ICD-10-CM | POA: Diagnosis present

## 2017-10-18 DIAGNOSIS — N189 Chronic kidney disease, unspecified: Secondary | ICD-10-CM | POA: Insufficient documentation

## 2017-10-18 NOTE — Telephone Encounter (Signed)
Printed avs and calender of upcoming appointment. Per 6/21 los

## 2017-10-18 NOTE — Progress Notes (Signed)
Hematology and Oncology Follow Up Visit  Matthew Schwartz [id number] November 26, 1932 84 y.o. 10/18/2017 4:01 PM Matthew Schwartz MDLittle, [name], MD   Principle Diagnosis: 84 year old man with chronic renal insufficiency anemia diagnosed in 2013.       Prior Therapy:   He is status post bone marrow biopsy completed on 09/14/2016 which did not show any evidence of dysplastic features.  Aranesp at 300 g every 4 weeks started on 10/12/2016.  His last injection was in 02/2017 was therapy discontinued because of patient's preference.   Current therapy: Active surveillance.  Interim History: Matthew Schwartz is here for a follow-up visit.  Since the last visit, he reports no major changes in his health.  He continues to be in reasonable shape ambulating with the help of a cane without any falls or syncope.  He denies any dyspnea on exertion or excessive fatigue.  He denies any weight loss or constitutional symptoms.  He denies any dyspnea on exertion.  He denies any blood loss including hematochezia or melena.  He continues to attend to activities of daily living without any decline.  He does not report any headaches, blurry vision, syncope or seizures.  He denies any alteration of mental status or confusion.  He does not report any fevers or chills or sweats. Does not report any cough or hemoptysis.  He denies any chest pain, palpitation orthopnea. Does not report any nausea, vomiting or abdominal pain. Does not report any alteration in his bowel habits. Does not report any hematuria, dysuria. He does not report any arthralgias or myalgias. Remaining review of systems is negative.  Medications: I have reviewed the patient's current medications.  Current Outpatient Medications  Medication Sig Dispense Refill  . amLODipine (NORVASC) 10 MG tablet Take 10 mg by mouth daily.  1  . aspirin 81 MG tablet Take 81 mg by mouth every morning.     Marland Kitchen atorvastatin (LIPITOR) 40 MG tablet Take 40 mg by mouth at bedtime.     .  hydrocortisone valerate ointment (WEST-CORT) 0.2 % Apply 1 application topically 2 (two) times daily.  2  . isosorbide mononitrate (IMDUR) 60 MG 24 hr tablet Take 60 mg by mouth every evening.    Marland Kitchen ketoconazole (NIZORAL) 2 % cream Apply 1 application topically daily as needed for irritation.    Marland Kitchen lactulose (CHRONULAC) 10 GM/15ML solution Take 10 g by mouth as needed for mild constipation.    . metFORMIN (GLUCOPHAGE-XR) 500 MG 24 hr tablet Take 500 mg by mouth 2 (two) times daily.    . metoprolol succinate (TOPROL-XL) 50 MG 24 hr tablet Take 50 mg by mouth 2 (two) times daily.  1  . nizatidine (AXID) 150 MG capsule Take 150 mg by mouth 2 (two) times daily as needed for heartburn.     Marland Kitchen PROCTOSOL HC 2.5 % rectal cream Place 1 application rectally daily as needed for hemorrhoids or itching.     . ramipril (ALTACE) 10 MG capsule Take 10 mg by mouth 2 (two) times daily.     Marland Kitchen SHINGRIX injection Inject as directed once.     No current facility-administered medications for this visit.      Allergies:  Allergies  Allergen Reactions  . Losartan Potassium Other (See Comments)    Unknown reaction that happened 15 years ago    Past Medical History, Surgical history, Social history, and Family History were reviewed and updated.   Physical Exam: Blood pressure (!) 119/56, pulse 81, temperature 98.7 F (37.1 C), temperature  source Oral, resp. rate 17, height '5\' 10"'  (1.778 m), weight 215 lb 8 oz (97.8 kg), SpO2 99 %. ECOG: 1 General appearance: Comfortable appearing gentleman without distress. Head: Atraumatic without abnormalities. Oropharynx: No ulcers or lesions. Eyes: No scleral icterus. Lymph nodes: No lymphadenopathy noted in the cervical, supraclavicular, or axillary nodes Heart:regular rate and rhythm, without any murmurs or gallops. Lung: Clear all lung fields without any rhonchi, wheezes or dullness to percussion. Abdomin: Soft, without any rebound or guarding.  No shifting dullness or  ascites. Musculoskeletal: No joint deformity or effusion. Neurological: Intact motor and sensory exam.   Lab Results: Lab Results  Component Value Date   WBC 5.7 06/21/2017   HGB 10.3 (L) 06/21/2017   HCT 31.9 (L) 06/21/2017   MCV 81.6 06/21/2017   PLT 238 06/21/2017     Chemistry      Component Value Date/Time   NA 137 11/02/2016 1509   K 4.4 11/02/2016 1509   CL 105 09/14/2016 0924   CO2 27 11/02/2016 1509   BUN 16.7 11/02/2016 1509   CREATININE 0.9 11/02/2016 1509      Component Value Date/Time   CALCIUM 9.1 11/02/2016 1509   ALKPHOS 58 11/02/2016 1509   AST 15 11/02/2016 1509   ALT 12 11/02/2016 1509   BILITOT 0.71 11/02/2016 1509         Impression and Plan:  84 year old gentleman with the following issues:  1.  Anemia of renal disease: He has also an element of iron deficiency that has been replaced in the past.  His hemoglobin obtained on 10/16/2017 showed hemoglobin of 11.1, white cell count of 6.9 and a platelet of 249.  He had a normal differential.  Based on these findings, his hemoglobin continues to be stable without any further decline.  Risks and benefits of restarting Aranesp was reviewed and his hemoglobin remains relatively stable without any intervention.  This will be a consideration in the future if his hemoglobin drops further.  We will continue to check his iron studies periodically and replace as needed.  2. History of prostate cancer: No evidence of relapse noted at this time.  He is status post definitive therapy.  3.  Iron deficiency: His iron studies are pending from today and he had poor oral iron intolerance.  Intravenous iron may be needed in the future.  4. Follow-up: We will be in 6 months to follow his progress.  15  minutes was spent with the patient face-to-face today.  More than 50% of time was dedicated to patient counseling, education and discussing his future plan of care.      Zola Button, MD 6/21/20194:01 PM

## 2017-10-21 ENCOUNTER — Telehealth: Payer: Self-pay | Admitting: *Deleted

## 2017-10-21 LAB — IRON AND TIBC
IRON: 51 ug/dL (ref 42–163)
Saturation Ratios: 21 % — ABNORMAL LOW (ref 42–163)
TIBC: 240 ug/dL (ref 202–409)
UIBC: 189 ug/dL

## 2017-10-21 LAB — FERRITIN: FERRITIN: 67 ng/mL (ref 22–316)

## 2017-10-21 NOTE — Telephone Encounter (Signed)
error 

## 2017-10-21 NOTE — Telephone Encounter (Signed)
-----   Message from Wyatt Portela, MD sent at 10/21/2017  9:47 AM EDT ----- Please mail him the results of labs including CBC and iron.

## 2017-10-21 NOTE — Telephone Encounter (Signed)
As noted below by Dr. Alen Blew, I mailed lab results to home address. Address verified.

## 2018-03-12 ENCOUNTER — Encounter: Payer: Self-pay | Admitting: Cardiology

## 2018-04-11 ENCOUNTER — Telehealth: Payer: Self-pay | Admitting: Oncology

## 2018-04-11 NOTE — Telephone Encounter (Signed)
Call day 12/20 moved appointments to 12/18. Spoke with patient.

## 2018-04-16 ENCOUNTER — Inpatient Hospital Stay: Payer: Medicare Other | Attending: Oncology | Admitting: Oncology

## 2018-04-16 ENCOUNTER — Inpatient Hospital Stay: Payer: Medicare Other

## 2018-04-16 ENCOUNTER — Other Ambulatory Visit: Payer: Medicare Other

## 2018-04-16 ENCOUNTER — Telehealth: Payer: Self-pay

## 2018-04-16 VITALS — BP 127/61 | HR 70 | Temp 98.0°F | Resp 17 | Ht 69.0 in | Wt 215.1 lb

## 2018-04-16 DIAGNOSIS — N189 Chronic kidney disease, unspecified: Secondary | ICD-10-CM | POA: Diagnosis present

## 2018-04-16 DIAGNOSIS — Z8546 Personal history of malignant neoplasm of prostate: Secondary | ICD-10-CM | POA: Diagnosis present

## 2018-04-16 DIAGNOSIS — C61 Malignant neoplasm of prostate: Secondary | ICD-10-CM

## 2018-04-16 DIAGNOSIS — D631 Anemia in chronic kidney disease: Secondary | ICD-10-CM

## 2018-04-16 NOTE — Progress Notes (Signed)
Hematology and Oncology Follow Up Visit  Matthew Schwartz [id number] November 22, 1932 84 y.o. 04/16/2018 3:39 PM Schwartz, Matthew Bihari MDLittle, Matthew Bihari, MD   Principle Diagnosis: 84 year old man with anemia diagnosed in 2013.  His anemia is related to chronic disease and renal insufficiency    Prior Therapy:   He is status post bone marrow biopsy completed on 09/14/2016 which did not show any evidence of dysplastic features.  Aranesp at 300 g every 4 weeks started on 10/12/2016.  His last injection was in 02/2017 was therapy discontinued because of patient's preference.   Current therapy: Active surveillance.  Interim History: Matthew Schwartz returns today for a repeat evaluation.  Since the last visit, he reports no major changes in his health.  He has made some changes with his lifestyle including dietary changes and cutting down on alcohol and smoking.  He is trying to lose weight and remains more active.  His performance status and quality of life remains reasonable.  He denies any bleeding bleeding hematochezia, melena or hemoptysis.   He does not report any headaches, blurry vision, syncope or seizures.  He denies any dizziness or lethargy.  He does not report any fevers or chills or sweats. Does not report any cough or hemoptysis.  He denies any chest pain, palpitation orthopnea. Does not report any nausea, vomiting or abdominal distention.  Does not report any constipation or diarrhea. Does not report any hematuria, dysuria. He does not report any bone pain or pathological fractures.  Denies any heat or cold intolerance.  Denies any mood changes.. Remaining review of systems is negative.  Medications: I have reviewed the patient's current medications.  Current Outpatient Medications  Medication Sig Dispense Refill  . amLODipine (NORVASC) 10 MG tablet Take 10 mg by mouth daily.  1  . aspirin 81 MG tablet Take 81 mg by mouth every morning.     Marland Kitchen atorvastatin (LIPITOR) 40 MG tablet Take 40 mg by mouth at  bedtime.     . hydrocortisone valerate ointment (WEST-CORT) 0.2 % Apply 1 application topically 2 (two) times daily.  2  . isosorbide mononitrate (IMDUR) 60 MG 24 hr tablet Take 60 mg by mouth every evening.    Marland Kitchen ketoconazole (NIZORAL) 2 % cream Apply 1 application topically daily as needed for irritation.    Marland Kitchen lactulose (CHRONULAC) 10 GM/15ML solution Take 10 g by mouth as needed for mild constipation.    . metFORMIN (GLUCOPHAGE-XR) 500 MG 24 hr tablet Take 500 mg by mouth 2 (two) times daily.    . metoprolol succinate (TOPROL-XL) 50 MG 24 hr tablet Take 50 mg by mouth 2 (two) times daily.  1  . nizatidine (AXID) 150 MG capsule Take 150 mg by mouth 2 (two) times daily as needed for heartburn.     Marland Kitchen PROCTOSOL HC 2.5 % rectal cream Place 1 application rectally daily as needed for hemorrhoids or itching.     . ramipril (ALTACE) 10 MG capsule Take 10 mg by mouth 2 (two) times daily.     Marland Kitchen SHINGRIX injection Inject as directed once.     No current facility-administered medications for this visit.      Allergies:  Allergies  Allergen Reactions  . Losartan Potassium Other (See Comments)    Unknown reaction that happened 15 years ago    Past Medical History, Surgical history, Social history, and Family History were reviewed and updated.   Physical Exam: Blood pressure 127/61, pulse 70, temperature 98 F (36.7 C), temperature source Oral, resp. rate  17, height '5\' 9"'  (1.753 m), weight 215 lb 1.6 oz (97.6 kg), SpO2 100 %.   ECOG: 1   General appearance: Alert, awake without any distress. Head: Atraumatic without abnormalities Oropharynx: Without any thrush or ulcers. Eyes: No scleral icterus. Lymph nodes: No lymphadenopathy noted in the cervical, supraclavicular, or axillary nodes Heart:regular rate and rhythm, without any murmurs or gallops.   Lung: Clear to auscultation without any rhonchi, wheezes or dullness to percussion. Abdomin: Soft, nontender without any shifting dullness or  ascites. Musculoskeletal: No clubbing or cyanosis. Neurological: No motor or sensory deficits. Skin: No rashes or lesions.    Lab Results: Lab Results  Component Value Date   WBC 5.7 06/21/2017   HGB 10.3 (L) 06/21/2017   HCT 31.9 (L) 06/21/2017   MCV 81.6 06/21/2017   PLT 238 06/21/2017     Chemistry      Component Value Date/Time   NA 137 11/02/2016 1509   K 4.4 11/02/2016 1509   CL 105 09/14/2016 0924   CO2 27 11/02/2016 1509   BUN 16.7 11/02/2016 1509   CREATININE 0.9 11/02/2016 1509      Component Value Date/Time   CALCIUM 9.1 11/02/2016 1509   ALKPHOS 58 11/02/2016 1509   AST 15 11/02/2016 1509   ALT 12 11/02/2016 1509   BILITOT 0.71 11/02/2016 1509         Impression and Plan:  84 year old man with:  1.  Anemia diagnosed in 2013.  He has element of chronic disease as well as anemia of renal insufficiency.  He has received Aranesp in the past without any dramatic changes in his hemoglobin counts.  Laboratory data obtained on April 02, 2018 were personally reviewed which showed a hemoglobin of 11.8, white cell count of 5.9 and a platelet count of 272.  His white cell count differential is within normal range with his MCV at 81.4.  His creatinine is 0.84 with a normal GFR.  He is asymptomatic from these findings and his hemoglobin remains reasonably close to normal range.  At this time, I have recommended continued active surveillance and repeat counts in 8 months.  2.  Prostate cancer: No evidence of relapse disease at this time he has received radiation therapy.  3.  Iron deficiency: Iron studies in June 2019 all within normal range.  4. Follow-up: We will be in 8 months.   15  minutes was spent with the patient face-to-face today.  More than 50% of time was dedicated to reviewing laboratory data, differential diagnosis and management options.    Zola Button, MD 12/18/20193:39 PM

## 2018-04-16 NOTE — Telephone Encounter (Signed)
Printed avs and calender of upcoming appointment. Per 12/18 los 

## 2018-04-18 ENCOUNTER — Ambulatory Visit: Payer: Medicare Other | Admitting: Oncology

## 2018-04-18 ENCOUNTER — Other Ambulatory Visit: Payer: Medicare Other

## 2018-04-28 ENCOUNTER — Ambulatory Visit (INDEPENDENT_AMBULATORY_CARE_PROVIDER_SITE_OTHER): Payer: Medicare Other | Admitting: Cardiology

## 2018-04-28 ENCOUNTER — Encounter: Payer: Self-pay | Admitting: Cardiology

## 2018-04-28 VITALS — BP 110/60 | HR 85 | Ht 69.0 in | Wt 214.2 lb

## 2018-04-28 DIAGNOSIS — E7800 Pure hypercholesterolemia, unspecified: Secondary | ICD-10-CM | POA: Diagnosis not present

## 2018-04-28 DIAGNOSIS — I2510 Atherosclerotic heart disease of native coronary artery without angina pectoris: Secondary | ICD-10-CM

## 2018-04-28 DIAGNOSIS — I2583 Coronary atherosclerosis due to lipid rich plaque: Secondary | ICD-10-CM | POA: Diagnosis not present

## 2018-04-28 DIAGNOSIS — I10 Essential (primary) hypertension: Secondary | ICD-10-CM

## 2018-04-28 DIAGNOSIS — I209 Angina pectoris, unspecified: Secondary | ICD-10-CM | POA: Diagnosis not present

## 2018-04-28 NOTE — Progress Notes (Signed)
Cardiology Office Note:    Date:  04/28/2018   ID:  Matthew Schwartz, DOB 1932-08-01, MRN [id number]  PCP:  [name], MD  Cardiologist:  Candee Furbish, MD  Electrophysiologist:  None   Referring MD: [name], MD     History of Present Illness:    Matthew Schwartz is a 84 y.o. male here for follow-up of coronary artery disease status post PTCA in the year 2000 to RCA, diabetes with hypertension, hyperlipidemia.  He is always very thankful of Dr. Leonia Reeves prior service.  Once again, balloon angioplasty only to his RCA.  As the story goes, his daughter was a Advertising copywriter and did not want stent to be placed. -Vegetarian, cane, active.  Been doing quite well.  No fevers chills nausea vomiting syncope bleeding.  He has been treated for prostate cancer.  Radiation therapy.  Prior anemia being treated with Aranesp.  Dr. Alen Blew of oncology has been following.  Past Medical History:  Diagnosis Date  . Diabetes mellitus without complication ([place])   . Dyslipidemia   . GERD (gastroesophageal reflux disease)   . Hypertension   . Prostate cancer ([place]) 12/23/13   Adenocarcinoma  . PSA elevation   . S/P radiation therapy 05/18/2014 through 07/16/2014    Prostate 7800 cGy in 40 sessions, seminal vesicles 5600 cGy in 40 sessions     Past Surgical History:  Procedure Laterality Date  . ANGIOPLASTY  2000  . cataract surgery  2000  . COLONOSCOPY    . PROSTATE BIOPSY  12/23/13  . REPLACEMENT TOTAL KNEE  2008/2009   bilateral    Current Medications: Current Meds  Medication Sig  . amLODipine (NORVASC) 10 MG tablet Take 10 mg by mouth daily.  Marland Kitchen aspirin 81 MG tablet Take 81 mg by mouth every morning.   Marland Kitchen atorvastatin (LIPITOR) 40 MG tablet Take 40 mg by mouth at bedtime.   . famotidine (PEPCID) 20 MG tablet Take 20 mg by mouth 2 (two) times daily.  . hydrocortisone valerate ointment (WEST-CORT) 0.2 % Apply 1  application topically 2 (two) times daily.  . isosorbide mononitrate (IMDUR) 60 MG 24 hr tablet Take 60 mg by mouth every evening.  Marland Kitchen ketoconazole (NIZORAL) 2 % cream Apply 1 application topically daily as needed for irritation.  Marland Kitchen lactulose (CHRONULAC) 10 GM/15ML solution Take 10 g by mouth as needed for mild constipation.  . metFORMIN (GLUCOPHAGE-XR) 500 MG 24 hr tablet Take 500 mg by mouth 2 (two) times daily.  . metoprolol succinate (TOPROL-XL) 50 MG 24 hr tablet Take 50 mg by mouth 2 (two) times daily.  Marland Kitchen PROCTOSOL HC 2.5 % rectal cream Place 1 application rectally daily as needed for hemorrhoids or itching.   . ramipril (ALTACE) 10 MG capsule Take 10 mg by mouth 2 (two) times daily.   Marland Kitchen SHINGRIX injection Inject as directed once.     Allergies:   Losartan potassium   Social History   Socioeconomic History  . Marital status: Married    Spouse name: Not on file  . Number of children: Not on file  . Years of education: Not on file  . Highest education level: Not on file  Occupational History  . Occupation: retired  Scientific laboratory technician  . Financial resource strain: Not on file  . Food insecurity:    Worry: Not on file    Inability: Not on file  . Transportation needs:    Medical: Not on file    Non-medical: Not on file  Tobacco Use  .  Smoking status: Former Smoker    Last attempt to quit: 06/14/1994    Years since quitting: 23.8  . Smokeless tobacco: Never Used  Substance and Sexual Activity  . Alcohol use: No  . Drug use: No  . Sexual activity: Not on file  Lifestyle  . Physical activity:    Days per week: Not on file    Minutes per session: Not on file  . Stress: Not on file  Relationships  . Social connections:    Talks on phone: Not on file    Gets together: Not on file    Attends religious service: Not on file    Active member of club or organization: Not on file    Attends meetings of clubs or organizations: Not on file    Relationship status: Not on file  Other  Topics Concern  . Not on file  Social History Narrative  . Not on file     Family History: The patient's family history includes CVA in his mother; Coronary artery disease in his brother, brother, and sister; Heart disease in his brother, brother, and sister.  ROS:   Please see the history of present illness.    Denies any fevers chills nausea vomiting syncope bleeding.  Does have some constipation and difficulty urine.  All other systems reviewed and are negative.  EKGs/Labs/Other Studies Reviewed:    The following studies were reviewed today: Prior office notes, lab work, EKG  EKG:  EKG is  ordered today.  The ekg ordered today demonstrates sinus rhythm 85 left axis deviation no other abnormalities.  Personally reviewed  Recent Labs: 06/21/2017: Hemoglobin 10.3; Platelets 238  Recent Lipid Panel No results found for: CHOL, TRIG, HDL, CHOLHDL, VLDL, LDLCALC, LDLDIRECT  Physical Exam:    VS:  BP 110/60   Pulse 85   Ht 5\' 9"  (1.753 m)   Wt 214 lb 3.2 oz (97.2 kg)   BMI 31.63 kg/m     Wt Readings from Last 3 Encounters:  04/28/18 214 lb 3.2 oz (97.2 kg)  04/16/18 215 lb 1.6 oz (97.6 kg)  10/18/17 215 lb 8 oz (97.8 kg)     GEN: Well nourished, well developed in no acute distress HEENT: Normal NECK: No JVD; No carotid bruits LYMPHATICS: No lymphadenopathy CARDIAC: RRR, no murmurs, rubs, gallops RESPIRATORY:  Clear to auscultation without rales, wheezing or rhonchi  ABDOMEN: Soft, non-tender, non-distended MUSCULOSKELETAL:  No edema; No deformity  SKIN: Warm and dry NEUROLOGIC:  Alert and oriented x 3 PSYCHIATRIC:  Normal affect   ASSESSMENT:    1. Coronary artery disease due to lipid rich plaque   2. Essential hypertension   3. Pure hypercholesterolemia   4. Angina pectoris (HCC)    PLAN:    In order of problems listed above:  Coronary artery disease - No anginal symptoms.  Prior balloon angioplasty to RCA.  Continue with aggressive secondary prevention  efforts.  Medications reviewed.  Essential hypertension - Overall well controlled.  Medications reviewed.  Angina - On isosorbide.  Controlling symptoms.  Doing well.  Hyperlipidemia -Atorvastatin 40 mg, high intensity dose.  He does admit that he is taking it about 2 times a week.  I would like for his LDL to be less than 70 but he does not wish to take any higher dosing.  LDL 84 on 04/02/18.   Medication Adjustments/Labs and Tests Ordered: Current medicines are reviewed at length with the patient today.  Concerns regarding medicines are outlined above.  Orders Placed  This Encounter  Procedures  . EKG 12-Lead   No orders of the defined types were placed in this encounter.   Patient Instructions  Medication Instructions:  The current medical regimen is effective;  continue present plan and medications.  If you need a refill on your cardiac medications before your next appointment, please call your pharmacy.   Follow-Up: At [facility], you and your health needs are our priority.  As part of our continuing mission to provide you with exceptional heart care, we have created designated Provider Care Teams.  These Care Teams include your primary Cardiologist (physician) and Advanced Practice Providers (APPs -  Physician Assistants and Nurse Practitioners) who all work together to provide you with the care you need, when you need it. You will need a follow up appointment in 12 months.  Please call our office 2 months in advance to schedule this appointment.  You may see Candee Furbish, MD or one of the following Advanced Practice Providers on your designated Care Team:   Truitt Merle, NP Cecilie Kicks, NP . Kathyrn Drown, NP      Signed, Candee Furbish, MD  04/28/2018 10:28 AM    [place]

## 2018-04-28 NOTE — Patient Instructions (Signed)
Medication Instructions:  The current medical regimen is effective;  continue present plan and medications.  If you need a refill on your cardiac medications before your next appointment, please call your pharmacy.   Follow-Up: At [facility], you and your health needs are our priority.  As part of our continuing mission to provide you with exceptional heart care, we have created designated Provider Care Teams.  These Care Teams include your primary Cardiologist (physician) and Advanced Practice Providers (APPs -  Physician Assistants and Nurse Practitioners) who all work together to provide you with the care you need, when you need it. You will need a follow up appointment in 12 months.  Please call our office 2 months in advance to schedule this appointment.  You may see Candee Furbish, MD or one of the following Advanced Practice Providers on your designated Care Team:   Truitt Merle, NP Cecilie Kicks, NP . Kathyrn Drown, NP

## 2018-11-14 ENCOUNTER — Telehealth: Payer: Self-pay | Admitting: Oncology

## 2018-11-14 NOTE — Telephone Encounter (Signed)
Call day 8/19 moved appointments from 8/19 to 8/26. Confirmed with patient.

## 2018-12-17 ENCOUNTER — Ambulatory Visit: Payer: Medicaid Other | Admitting: Oncology

## 2018-12-17 ENCOUNTER — Other Ambulatory Visit: Payer: Medicaid Other

## 2018-12-24 ENCOUNTER — Other Ambulatory Visit: Payer: Medicaid Other

## 2018-12-24 ENCOUNTER — Ambulatory Visit: Payer: Medicaid Other | Admitting: Oncology

## 2019-01-14 ENCOUNTER — Other Ambulatory Visit: Payer: Self-pay

## 2019-01-14 ENCOUNTER — Inpatient Hospital Stay: Payer: Medicare Other | Attending: Oncology

## 2019-01-14 ENCOUNTER — Inpatient Hospital Stay (HOSPITAL_BASED_OUTPATIENT_CLINIC_OR_DEPARTMENT_OTHER): Payer: Medicare Other | Admitting: Oncology

## 2019-01-14 VITALS — BP 138/63 | HR 61 | Temp 98.5°F | Resp 17 | Ht 69.0 in | Wt 207.8 lb

## 2019-01-14 DIAGNOSIS — N189 Chronic kidney disease, unspecified: Secondary | ICD-10-CM | POA: Insufficient documentation

## 2019-01-14 DIAGNOSIS — Z8546 Personal history of malignant neoplasm of prostate: Secondary | ICD-10-CM | POA: Diagnosis not present

## 2019-01-14 DIAGNOSIS — D631 Anemia in chronic kidney disease: Secondary | ICD-10-CM

## 2019-01-14 LAB — CBC WITH DIFFERENTIAL (CANCER CENTER ONLY)
Abs Immature Granulocytes: 0.02 10*3/uL (ref 0.00–0.07)
Basophils Absolute: 0 10*3/uL (ref 0.0–0.1)
Basophils Relative: 1 %
Eosinophils Absolute: 0.6 10*3/uL — ABNORMAL HIGH (ref 0.0–0.5)
Eosinophils Relative: 8 %
HCT: 33.5 % — ABNORMAL LOW (ref 39.0–52.0)
Hemoglobin: 10.4 g/dL — ABNORMAL LOW (ref 13.0–17.0)
Immature Granulocytes: 0 %
Lymphocytes Relative: 23 %
Lymphs Abs: 1.6 10*3/uL (ref 0.7–4.0)
MCH: 25.9 pg — ABNORMAL LOW (ref 26.0–34.0)
MCHC: 31 g/dL (ref 30.0–36.0)
MCV: 83.5 fL (ref 80.0–100.0)
Monocytes Absolute: 0.7 10*3/uL (ref 0.1–1.0)
Monocytes Relative: 10 %
Neutro Abs: 3.9 10*3/uL (ref 1.7–7.7)
Neutrophils Relative %: 58 %
Platelet Count: 247 10*3/uL (ref 150–400)
RBC: 4.01 MIL/uL — ABNORMAL LOW (ref 4.22–5.81)
RDW: 16.7 % — ABNORMAL HIGH (ref 11.5–15.5)
WBC Count: 6.8 10*3/uL (ref 4.0–10.5)
nRBC: 0 % (ref 0.0–0.2)

## 2019-01-14 LAB — IRON AND TIBC
Iron: 51 ug/dL (ref 42–163)
Saturation Ratios: 21 % (ref 20–55)
TIBC: 239 ug/dL (ref 202–409)
UIBC: 188 ug/dL (ref 117–376)

## 2019-01-14 LAB — FERRITIN: Ferritin: 69 ng/mL (ref 24–336)

## 2019-01-14 NOTE — Progress Notes (Signed)
Hematology and Oncology Follow Up Visit  Matthew Schwartz [id number] June 18, 1932 85 y.o. 01/14/2019 12:40 PM [name], Matthew Bihari MDLittle, Matthew Bihari, Schwartz   Principle Diagnosis: 85 year old man with anemia related to chronic renal insufficiency as well as chronic disease diagnosed in 2013.     Prior Therapy:   He is status post bone marrow biopsy completed on 09/14/2016 which did not show any evidence of dysplastic features.  Aranesp at 300 g every 4 weeks started on 10/12/2016.  His last injection was in 02/2017 was therapy discontinued because of patient's preference.   Current therapy: Active surveillance.  Interim History: Mr. Matthew Schwartz is here for a follow-up.  Since the last visit, he reports no major changes in his health.  He does report some mild fatigue and tiredness but no deterioration in his health.  He denies any recent hospitalizations or illnesses.  He denies any bone pain or pathological fractures.  His quality of life remains unchanged.  Patient denied any alteration mental status, neuropathy, confusion or dizziness.  Denies any headaches or lethargy.  Denies any night sweats, weight loss or changes in appetite.  Denied orthopnea, dyspnea on exertion or chest discomfort.  Denies shortness of breath, difficulty breathing hemoptysis or cough.  Denies any abdominal distention, nausea, early satiety or dyspepsia.  Denies any hematuria, frequency, dysuria or nocturia.  Denies any skin irritation, dryness or rash.  Denies any ecchymosis or petechiae.  Denies any lymphadenopathy or clotting.  Denies any heat or cold intolerance.  Denies any anxiety or depression.  Remaining review of system is negative.     Medications: Updated on review. Current Outpatient Medications  Medication Sig Dispense Refill  . amLODipine (NORVASC) 10 MG tablet Take 10 mg by mouth daily.  1  . aspirin 81 MG tablet Take 81 mg by mouth every morning.     Marland Kitchen atorvastatin (LIPITOR) 40 MG tablet Take 40 mg by mouth at bedtime.      . famotidine (PEPCID) 20 MG tablet Take 20 mg by mouth 2 (two) times daily.    . hydrocortisone valerate ointment (WEST-CORT) 0.2 % Apply 1 application topically 2 (two) times daily.  2  . isosorbide mononitrate (IMDUR) 60 MG 24 hr tablet Take 60 mg by mouth every evening.    Marland Kitchen ketoconazole (NIZORAL) 2 % cream Apply 1 application topically daily as needed for irritation.    Marland Kitchen lactulose (CHRONULAC) 10 GM/15ML solution Take 10 g by mouth as needed for mild constipation.    . metFORMIN (GLUCOPHAGE-XR) 500 MG 24 hr tablet Take 500 mg by mouth 2 (two) times daily.    . metoprolol succinate (TOPROL-XL) 50 MG 24 hr tablet Take 50 mg by mouth 2 (two) times daily.  1  . PROCTOSOL HC 2.5 % rectal cream Place 1 application rectally daily as needed for hemorrhoids or itching.     . ramipril (ALTACE) 10 MG capsule Take 10 mg by mouth 2 (two) times daily.     Marland Kitchen SHINGRIX injection Inject as directed once.     No current facility-administered medications for this visit.      Allergies:  Allergies  Allergen Reactions  . Losartan Potassium Other (See Comments)    Unknown reaction that happened 15 years ago    Past Medical History, Surgical history, Social history, and Family History updated without any changes.   Physical Exam: Blood pressure 138/63, pulse 61, temperature 98.5 F (36.9 C), temperature source Oral, resp. rate 17, height '5\' 9"'  (1.753 m), weight 207 lb 12.8 oz (  94.3 kg), SpO2 100 %.   ECOG: 1    General appearance: Comfortable appearing without any discomfort Head: Normocephalic without any trauma Oropharynx: Mucous membranes are moist and pink without any thrush or ulcers. Eyes: Pupils are equal and round reactive to light. Lymph nodes: No cervical, supraclavicular, inguinal or axillary lymphadenopathy.   Heart:regular rate and rhythm.  S1 and S2 without leg edema. Lung: Clear without any rhonchi or wheezes.  No dullness to percussion. Abdomin: Soft, nontender, nondistended  with good bowel sounds.  No hepatosplenomegaly. Musculoskeletal: No joint deformity or effusion.  Full range of motion noted. Neurological: No deficits noted on motor, sensory and deep tendon reflex exam. Skin: No petechial rash or dryness.  Appeared moist.      Lab Results: Lab Results  Component Value Date   WBC 6.8 01/14/2019   HGB 10.4 (L) 01/14/2019   HCT 33.5 (L) 01/14/2019   MCV 83.5 01/14/2019   PLT 247 01/14/2019     Chemistry      Component Value Date/Time   NA 137 11/02/2016 1509   K 4.4 11/02/2016 1509   CL 105 09/14/2016 0924   CO2 27 11/02/2016 1509   BUN 16.7 11/02/2016 1509   CREATININE 0.9 11/02/2016 1509      Component Value Date/Time   CALCIUM 9.1 11/02/2016 1509   ALKPHOS 58 11/02/2016 1509   AST 15 11/02/2016 1509   ALT 12 11/02/2016 1509   BILITOT 0.71 11/02/2016 1509         Impression and Plan:  85 year old man with:  1.  Multifactorial anemia related to chronic disease and previous radiation as well as renal insufficiency diagnosed in 2013.    Laboratory data from today reviewed and showed a stable hemoglobin currently at 10.4 without any new symptoms.  Differential diagnosis was reviewed which include chronic disease anemia versus myelosuppression from previous radiation.  Myelodysplasia is also a possibility although his counts have been stable for extended period of time.  Management options at this time were reviewed which include continued active surveillance versus retreatment with growth factor support.  He is asymptomatic from his anemia and his symptoms are likely multifactorial in nature.  We will continue to observe for the time being and he is agreeable with that.   2.  Prostate cancer: Status post radiation therapy without any evidence of relapsed disease.  3.  Iron deficiency: will continue to check iron studies periodically.  4. Follow-up: In 12 months for repeat evaluation.  15  minutes was spent with the patient  face-to-face today.  More than 50% of time was spent on reviewing laboratory data, differential diagnosis and management options.    Zola Button, Schwartz 9/16/202012:40 PM

## 2019-01-15 ENCOUNTER — Telehealth: Payer: Self-pay | Admitting: Oncology

## 2019-01-15 NOTE — Telephone Encounter (Signed)
Called and left msg. Mailed printout  °

## 2019-04-20 ENCOUNTER — Ambulatory Visit: Payer: Medicare Other | Admitting: Cardiology

## 2019-05-20 ENCOUNTER — Ambulatory Visit: Payer: Medicare Other | Attending: Internal Medicine

## 2019-05-20 DIAGNOSIS — Z23 Encounter for immunization: Secondary | ICD-10-CM | POA: Insufficient documentation

## 2019-05-20 NOTE — Progress Notes (Signed)
   Covid-19 Vaccination Clinic  Name:  Taevyn Vanalst    MRN: [id number] DOB: 08-19-1932  05/20/2019  Mr. Arriola was observed post Covid-19 immunization for 15 minutes without incidence. He was provided with Vaccine Information Sheet and instruction to access the V-Safe system.   Mr. Madeira was instructed to call 911 with any severe reactions post vaccine: Marland Kitchen Difficulty breathing  . Swelling of your face and throat  . A fast heartbeat  . A bad rash all over your body  . Dizziness and weakness    Immunizations Administered    Name Date Dose VIS Date Route   Pfizer COVID-19 Vaccine 05/20/2019  1:11 PM 0.3 mL 04/10/2019 Intramuscular   Manufacturer: [place]   Lot: [id number]   [place]: [id number]

## 2019-06-10 ENCOUNTER — Ambulatory Visit: Payer: Medicare Other | Attending: Internal Medicine

## 2019-06-10 DIAGNOSIS — Z23 Encounter for immunization: Secondary | ICD-10-CM | POA: Insufficient documentation

## 2019-06-10 NOTE — Progress Notes (Signed)
   Covid-19 Vaccination Clinic  Name:  Matthew Schwartz    MRN: [id number] DOB: 08-18-1932  06/10/2019  [name] was observed post Covid-19 immunization for 15 minutes without incidence. He was provided with Vaccine Information Sheet and instruction to access the V-Safe system.   [name] was instructed to call 911 with any severe reactions post vaccine: Marland Kitchen Difficulty breathing  . Swelling of your face and throat  . A fast heartbeat  . A bad rash all over your body  . Dizziness and weakness    Immunizations Administered    Name Date Dose VIS Date Route   Pfizer COVID-19 Vaccine 06/10/2019 12:33 PM 0.3 mL 04/10/2019 Intramuscular   Manufacturer: [place]   Lot: [id number]   [place]: [id number]

## 2019-07-22 ENCOUNTER — Other Ambulatory Visit: Payer: Self-pay

## 2019-07-22 ENCOUNTER — Ambulatory Visit (INDEPENDENT_AMBULATORY_CARE_PROVIDER_SITE_OTHER): Payer: Medicare Other | Admitting: Cardiology

## 2019-07-22 ENCOUNTER — Encounter: Payer: Self-pay | Admitting: Cardiology

## 2019-07-22 VITALS — BP 120/50 | HR 69 | Ht 69.0 in | Wt 206.0 lb

## 2019-07-22 DIAGNOSIS — I209 Angina pectoris, unspecified: Secondary | ICD-10-CM

## 2019-07-22 DIAGNOSIS — I2583 Coronary atherosclerosis due to lipid rich plaque: Secondary | ICD-10-CM

## 2019-07-22 DIAGNOSIS — I10 Essential (primary) hypertension: Secondary | ICD-10-CM | POA: Diagnosis not present

## 2019-07-22 DIAGNOSIS — E7800 Pure hypercholesterolemia, unspecified: Secondary | ICD-10-CM

## 2019-07-22 DIAGNOSIS — I2510 Atherosclerotic heart disease of native coronary artery without angina pectoris: Secondary | ICD-10-CM | POA: Diagnosis not present

## 2019-07-22 NOTE — Patient Instructions (Signed)
Medication Instructions:  The current medical regimen is effective;  continue present plan and medications.  *If you need a refill on your cardiac medications before your next appointment, please call your pharmacy*  Follow-Up: At [facility], you and your health needs are our priority.  As part of our continuing mission to provide you with exceptional heart care, we have created designated Provider Care Teams.  These Care Teams include your primary Cardiologist (physician) and Advanced Practice Providers (APPs -  Physician Assistants and Nurse Practitioners) who all work together to provide you with the care you need, when you need it.  We recommend signing up for the patient portal called "MyChart".  Sign up information is provided on this After Visit Summary.  MyChart is used to connect with patients for Virtual Visits (Telemedicine).  Patients are able to view lab/test results, encounter notes, upcoming appointments, etc.  Non-urgent messages can be sent to your provider as well.   To learn more about what you can do with MyChart, go to [email or web address].    Your next appointment:   12 month(s)  The format for your next appointment:   In Person  Provider:   Mark Skains, MD   Thank you for choosing [facility]!!      

## 2019-07-22 NOTE — Progress Notes (Signed)
Cardiology Office Note:    Date:  07/22/2019   ID:  Matthew Schwartz, DOB [birth year], MRN [id number]  PCP:  [name], MD  Cardiologist:  Candee Furbish, MD  Electrophysiologist:  None   Referring MD: [name], MD     History of Present Illness:    Matthew Schwartz is a 86 y.o. male here for follow-up of coronary artery disease status post PTCA in the year 2000 to RCA, diabetes with hypertension, hyperlipidemia.  He is always very thankful of Dr. Leonia Reeves prior service.  Once again, balloon angioplasty only to his RCA.  As the story goes, his daughter was a Advertising copywriter and did not want stent to be placed.  -Vegetarian, cane, active.    He has been treated for prostate cancer.  Radiation therapy.  Prior anemia being treated with Aranesp.  Dr. Alen Blew of oncology has been following.  07/22/2019 -Here for the follow-up of coronary artery disease status post PTCA of RCA.  Overall doing well.  No fevers chills nausea vomiting syncope bleeding.  Past Medical History:  Diagnosis Date  . Diabetes mellitus without complication ([place])   . Dyslipidemia   . GERD (gastroesophageal reflux disease)   . Hypertension   . Prostate cancer ([place]) 12/23/13   Adenocarcinoma  . PSA elevation   . S/P radiation therapy 05/18/2014 through 07/16/2014    Prostate 7800 cGy in 40 sessions, seminal vesicles 5600 cGy in 40 sessions     Past Surgical History:  Procedure Laterality Date  . ANGIOPLASTY  2000  . cataract surgery  2000  . COLONOSCOPY    . PROSTATE BIOPSY  12/23/13  . REPLACEMENT TOTAL KNEE  2008/2009   bilateral    Current Medications: Current Meds  Medication Sig  . amLODipine (NORVASC) 5 MG tablet Take 5 mg by mouth daily.  Marland Kitchen aspirin 81 MG tablet Take 81 mg by mouth every morning.   Marland Kitchen atorvastatin (LIPITOR) 40 MG tablet Take 40 mg by mouth at bedtime.   . famotidine (PEPCID) 20 MG tablet Take 20 mg by mouth 2  (two) times daily.  . hydrocortisone valerate ointment (WEST-CORT) 0.2 % Apply 1 application topically 2 (two) times daily.  . isosorbide mononitrate (IMDUR) 60 MG 24 hr tablet Take 60 mg by mouth every evening.  Marland Kitchen ketoconazole (NIZORAL) 2 % cream Apply 1 application topically daily as needed for irritation.  Marland Kitchen lactulose (CHRONULAC) 10 GM/15ML solution Take 10 g by mouth as needed for mild constipation.  . metFORMIN (GLUCOPHAGE-XR) 500 MG 24 hr tablet Take 500 mg by mouth 2 (two) times daily.  . metoprolol succinate (TOPROL-XL) 50 MG 24 hr tablet Take 50 mg by mouth 2 (two) times daily.  Marland Kitchen PROCTOSOL HC 2.5 % rectal cream Place 1 application rectally daily as needed for hemorrhoids or itching.   . ramipril (ALTACE) 10 MG capsule Take 10 mg by mouth 2 (two) times daily.   Marland Kitchen SHINGRIX injection Inject as directed once.     Allergies:   Losartan potassium   Social History   Socioeconomic History  . Marital status: Married    Spouse name: Not on file  . Number of children: Not on file  . Years of education: Not on file  . Highest education level: Not on file  Occupational History  . Occupation: retired  Tobacco Use  . Smoking status: Former Smoker    Quit date: 06/14/1994    Years since quitting: 25.1  . Smokeless tobacco: Never Used  Substance and Sexual Activity  .  Alcohol use: No  . Drug use: No  . Sexual activity: Not on file  Other Topics Concern  . Not on file  Social History Narrative  . Not on file   Social Determinants of Health   Financial Resource Strain:   . Difficulty of Paying Living Expenses:   Food Insecurity:   . Worried About Charity fundraiser in the Last Year:   . Arboriculturist in the Last Year:   Transportation Needs:   . Film/video editor (Medical):   Marland Kitchen Lack of Transportation (Non-Medical):   Physical Activity:   . Days of Exercise per Week:   . Minutes of Exercise per Session:   Stress:   . Feeling of Stress :   Social Connections:   .  Frequency of Communication with Friends and Family:   . Frequency of Social Gatherings with Friends and Family:   . Attends Religious Services:   . Active Member of Clubs or Organizations:   . Attends Archivist Meetings:   Marland Kitchen Marital Status:      Family History: The patient's family history includes CVA in his mother; Coronary artery disease in his brother, brother, and sister; Heart disease in his brother, brother, and sister.  ROS:   Please see the history of present illness.    Denies any fevers chills nausea vomiting syncope bleeding.  Does have some constipation and difficulty urine.  All other systems reviewed and are negative.  EKGs/Labs/Other Studies Reviewed:    The following studies were reviewed today: Prior office notes, lab work, EKG  EKG:  EKG is  ordered today.  The ekg ordered today demonstrates sinus rhythm 69 with PVCs.  Sinus rhythm 85 left axis deviation no other abnormalities.  Personally reviewed  Recent Labs: 01/14/2019: Hemoglobin 10.4; Platelet Count 247  Recent Lipid Panel No results found for: CHOL, TRIG, HDL, CHOLHDL, VLDL, LDLCALC, LDLDIRECT  Physical Exam:    VS:  BP (!) 120/50   Pulse 69   Ht 5\' 9"  (1.753 m)   Wt 206 lb (93.4 kg)   SpO2 98%   BMI 30.42 kg/m     Wt Readings from Last 3 Encounters:  07/22/19 206 lb (93.4 kg)  01/14/19 207 lb 12.8 oz (94.3 kg)  04/28/18 214 lb 3.2 oz (97.2 kg)     GEN: Well nourished, well developed, in no acute distress  HEENT: normal  Neck: no JVD, carotid bruits, or masses Cardiac: RRR; no murmurs, rubs, or gallops,no edema  Respiratory:  clear to auscultation bilaterally, normal work of breathing GI: soft, nontender, nondistended, + BS MS: no deformity or atrophy  Skin: warm and dry, no rash Neuro:  Alert and Oriented x 3, Strength and sensation are intact Psych: euthymic mood, full affect   ASSESSMENT:    1. Coronary artery disease due to lipid rich plaque   2. Angina pectoris ([place])     3. Essential hypertension   4. Pure hypercholesterolemia    PLAN:    In order of problems listed above:  Coronary artery disease - No anginal symptoms.  Prior balloon angioplasty to RCA.  Continue with aggressive secondary prevention efforts.  Medications reviewed.  No changes made.  Continue with active lifestyle.  Fruits vegetables.  Essential hypertension - Overall well controlled.  Medications reviewed.  No changes made.  Angina - On isosorbide.  Controlling symptoms.  Doing well.  Has not seem to change in years.  Hyperlipidemia -Atorvastatin 40 mg, high intensity dose.  He does admit that he is taking it about 2 times a week.  I would like for his LDL to be less than 70 but he does not wish to take any higher dosing.  LDL 84 on 04/02/18.    Medication Adjustments/Labs and Tests Ordered: Current medicines are reviewed at length with the patient today.  Concerns regarding medicines are outlined above.  Orders Placed This Encounter  Procedures  . EKG 12-Lead   No orders of the defined types were placed in this encounter.   Patient Instructions  Medication Instructions:  The current medical regimen is effective;  continue present plan and medications.  *If you need a refill on your cardiac medications before your next appointment, please call your pharmacy*  Follow-Up: At [facility], you and your health needs are our priority.  As part of our continuing mission to provide you with exceptional heart care, we have created designated Provider Care Teams.  These Care Teams include your primary Cardiologist (physician) and Advanced Practice Providers (APPs -  Physician Assistants and Nurse Practitioners) who all work together to provide you with the care you need, when you need it.  We recommend signing up for the patient portal called "MyChart".  Sign up information is provided on this After Visit Summary.  MyChart is used to connect with patients for Virtual Visits  (Telemedicine).  Patients are able to view lab/test results, encounter notes, upcoming appointments, etc.  Non-urgent messages can be sent to your provider as well.   To learn more about what you can do with MyChart, go to [email or web address].    Your next appointment:   12 month(s)  The format for your next appointment:   In Person  Provider:   Candee Furbish, MD  Thank you for choosing [facility]!!        Signed, Candee Furbish, MD  07/22/2019 3:28 PM    [place]

## 2019-10-09 ENCOUNTER — Other Ambulatory Visit: Payer: Self-pay

## 2019-10-09 ENCOUNTER — Emergency Department (HOSPITAL_COMMUNITY): Payer: Medicare Other

## 2019-10-09 ENCOUNTER — Emergency Department (HOSPITAL_COMMUNITY)
Admission: EM | Admit: 2019-10-09 | Discharge: 2019-10-09 | Disposition: A | Discharge status: Home / Self Care | Payer: Medicare Other | Source: Home / Self Care | Attending: Emergency Medicine | Admitting: Emergency Medicine

## 2019-10-09 ENCOUNTER — Encounter (HOSPITAL_COMMUNITY): Payer: Self-pay

## 2019-10-09 DIAGNOSIS — K5900 Constipation, unspecified: Secondary | ICD-10-CM

## 2019-10-09 DIAGNOSIS — A419 Sepsis, unspecified organism: Secondary | ICD-10-CM | POA: Diagnosis not present

## 2019-10-09 DIAGNOSIS — I2510 Atherosclerotic heart disease of native coronary artery without angina pectoris: Secondary | ICD-10-CM | POA: Insufficient documentation

## 2019-10-09 DIAGNOSIS — Z7982 Long term (current) use of aspirin: Secondary | ICD-10-CM | POA: Insufficient documentation

## 2019-10-09 DIAGNOSIS — R1084 Generalized abdominal pain: Secondary | ICD-10-CM | POA: Insufficient documentation

## 2019-10-09 DIAGNOSIS — R112 Nausea with vomiting, unspecified: Secondary | ICD-10-CM | POA: Insufficient documentation

## 2019-10-09 DIAGNOSIS — Z791 Long term (current) use of non-steroidal anti-inflammatories (NSAID): Secondary | ICD-10-CM | POA: Insufficient documentation

## 2019-10-09 DIAGNOSIS — Z7984 Long term (current) use of oral hypoglycemic drugs: Secondary | ICD-10-CM | POA: Insufficient documentation

## 2019-10-09 DIAGNOSIS — Z87891 Personal history of nicotine dependence: Secondary | ICD-10-CM | POA: Insufficient documentation

## 2019-10-09 DIAGNOSIS — Z96653 Presence of artificial knee joint, bilateral: Secondary | ICD-10-CM | POA: Insufficient documentation

## 2019-10-09 DIAGNOSIS — I10 Essential (primary) hypertension: Secondary | ICD-10-CM | POA: Insufficient documentation

## 2019-10-09 DIAGNOSIS — E119 Type 2 diabetes mellitus without complications: Secondary | ICD-10-CM | POA: Insufficient documentation

## 2019-10-09 DIAGNOSIS — Z888 Allergy status to other drugs, medicaments and biological substances status: Secondary | ICD-10-CM | POA: Insufficient documentation

## 2019-10-09 LAB — BASIC METABOLIC PANEL
Anion gap: 11 (ref 5–15)
BUN: 19 mg/dL (ref 8–23)
CO2: 24 mmol/L (ref 22–32)
Calcium: 8.7 mg/dL — ABNORMAL LOW (ref 8.9–10.3)
Chloride: 98 mmol/L (ref 98–111)
Creatinine, Ser: 0.71 mg/dL (ref 0.61–1.24)
GFR calc Af Amer: >60 mL/min (ref >60)
GFR calc non Af Amer: >60 mL/min (ref >60)
Glucose, Bld: 135 mg/dL — ABNORMAL HIGH (ref 70–99)
Potassium: 4.3 mmol/L (ref 3.5–5.1)
Sodium: 133 mmol/L — ABNORMAL LOW (ref 135–145)

## 2019-10-09 LAB — COMPREHENSIVE METABOLIC PANEL
ALT: 15 U/L (ref 0–44)
AST: 52 U/L — ABNORMAL HIGH (ref 15–41)
Albumin: 4.2 g/dL (ref 3.5–5.0)
Alkaline Phosphatase: 55 U/L (ref 38–126)
Anion gap: 12 (ref 5–15)
BUN: 20 mg/dL (ref 8–23)
CO2: 23 mmol/L (ref 22–32)
Calcium: 9 mg/dL (ref 8.9–10.3)
Chloride: 97 mmol/L — ABNORMAL LOW (ref 98–111)
Creatinine, Ser: 0.93 mg/dL (ref 0.61–1.24)
GFR calc Af Amer: >60 mL/min (ref >60)
GFR calc non Af Amer: >60 mL/min (ref >60)
Glucose, Bld: 157 mg/dL — ABNORMAL HIGH (ref 70–99)
Potassium: 5.5 mmol/L — ABNORMAL HIGH (ref 3.5–5.1)
Sodium: 132 mmol/L — ABNORMAL LOW (ref 135–145)
Total Bilirubin: 1.7 mg/dL — ABNORMAL HIGH (ref 0.3–1.2)
Total Protein: 7.7 g/dL (ref 6.5–8.1)

## 2019-10-09 LAB — CBC
HCT: 41.8 % (ref 39.0–52.0)
Hemoglobin: 13.7 g/dL (ref 13.0–17.0)
MCH: 26.2 pg (ref 26.0–34.0)
MCHC: 32.8 g/dL (ref 30.0–36.0)
MCV: 79.9 fL — ABNORMAL LOW (ref 80.0–100.0)
Platelets: 259 10*3/uL (ref 150–400)
RBC: 5.23 MIL/uL (ref 4.22–5.81)
RDW: 17 % — ABNORMAL HIGH (ref 11.5–15.5)
WBC: 7.9 10*3/uL (ref 4.0–10.5)
nRBC: 0 % (ref 0.0–0.2)

## 2019-10-09 LAB — URINALYSIS, ROUTINE W REFLEX MICROSCOPIC
Bacteria, UA: NONE SEEN
Bilirubin Urine: NEGATIVE
Glucose, UA: NEGATIVE mg/dL
Hgb urine dipstick: NEGATIVE
Ketones, ur: 5 mg/dL — AB
Leukocytes,Ua: NEGATIVE
Nitrite: NEGATIVE
Protein, ur: >=300 mg/dL — AB
Specific Gravity, Urine: 1.017 (ref 1.005–1.030)
pH: 6 (ref 5.0–8.0)

## 2019-10-09 LAB — LIPASE, BLOOD: Lipase: 33 U/L (ref 11–51)

## 2019-10-09 MED ORDER — FLEET ENEMA 7-19 GM/118ML RE ENEM
1.0000 | ENEMA | Freq: Once | RECTAL | Status: AC
  Administered 2019-10-09: 1 via RECTAL
  Filled 2019-10-09: qty 1

## 2019-10-09 MED ORDER — SODIUM CHLORIDE 0.9% FLUSH
3.0000 mL | Freq: Once | INTRAVENOUS | Status: AC
  Administered 2019-10-09: 3 mL via INTRAVENOUS

## 2019-10-09 MED ORDER — FENTANYL CITRATE (PF) 100 MCG/2ML IJ SOLN
50.0000 ug | Freq: Once | INTRAMUSCULAR | Status: AC
  Administered 2019-10-09: 50 ug via INTRAVENOUS
  Filled 2019-10-09: qty 2

## 2019-10-09 MED ORDER — SODIUM CHLORIDE (PF) 0.9 % IJ SOLN
INTRAMUSCULAR | Status: AC
  Filled 2019-10-09: qty 50

## 2019-10-09 MED ORDER — ONDANSETRON HCL 4 MG/2ML IJ SOLN
4.0000 mg | Freq: Once | INTRAMUSCULAR | Status: AC
  Administered 2019-10-09: 4 mg via INTRAVENOUS
  Filled 2019-10-09: qty 2

## 2019-10-09 MED ORDER — IOHEXOL 300 MG/ML  SOLN
100.0000 mL | Freq: Once | INTRAMUSCULAR | Status: AC | PRN
  Administered 2019-10-09: 100 mL via INTRAVENOUS

## 2019-10-09 MED ORDER — SENNOSIDES-DOCUSATE SODIUM 8.6-50 MG PO TABS: 1.0000 | ORAL_TABLET | Freq: Every day | ORAL | 0 refills | Status: AC

## 2019-10-09 MED ORDER — ONDANSETRON 4 MG PO TBDP
4.0000 mg | ORAL_TABLET | Freq: Three times a day (TID) | ORAL | 0 refills | Status: DC | PRN
Start: 2019-10-09 — End: 2019-10-11

## 2019-10-09 NOTE — Discharge Instructions (Addendum)
When you get home this evening, recommend trial of MiraLAX as discussed.  Take 1 serving every 2 hours for up to 4 doses.  If this is not successful, you can try this again tomorrow.  Thereafter, would recommend doing MiraLAX once daily.  Additionally recommend adding the Senna-Docusate daily for now. Use zofran as needed for nausea. Please follow up with your primary for recheck next week. Return if you have recurrent vomiting, worsening abdominal pain or other new concerning symptom.

## 2019-10-09 NOTE — ED Provider Notes (Signed)
[facility] DEPT Provider Note   CSN: [id number] Arrival date & time: 10/09/19  3762     History Chief Complaint  Patient presents with  . Constipation  . Emesis  . Abdominal Pain    Matthew Schwartz is a 86 y.o. male.  Presents to ER with concern for constipation, vomiting, abdominal pain.  Symptoms ongoing for the last few days, has not had regular bowel movement in 3 days.  Is still passing some gas.  Yesterday and today had 2 episodes of nonbloody nonbilious emesis.  Has had poor appetite as well.  Pain currently generalized, moderate, sharp, stabbing.  No alleviating or aggravating factors.  History of diabetes, hyperlipidemia, gastric reflux, CAD, denies prior abdominal surgical history.  HPI     Past Medical History:  Diagnosis Date  . Diabetes mellitus without complication ([place])   . Dyslipidemia   . GERD (gastroesophageal reflux disease)   . Hypertension   . Prostate cancer ([place]) 12/23/13   Adenocarcinoma  . PSA elevation   . S/P radiation therapy 05/18/2014 through 07/16/2014    Prostate 7800 cGy in 40 sessions, seminal vesicles 5600 cGy in 40 sessions     Patient Active Problem List   Diagnosis Date Noted  . Anemia 09/07/2016  . Coronary artery disease due to lipid rich plaque 02/16/2015  . Hyperlipidemia 02/16/2015  . Malignant neoplasm of prostate ([place]) 02/18/2014  . HTN (hypertension) 06/14/2013  . Dyslipidemia 06/14/2013  . GERD (gastroesophageal reflux disease) 06/14/2013    Past Surgical History:  Procedure Laterality Date  . ANGIOPLASTY  2000  . cataract surgery  2000  . COLONOSCOPY    . PROSTATE BIOPSY  12/23/13  . REPLACEMENT TOTAL KNEE  2008/2009   bilateral       Family History  Problem Relation Age of Onset  . CVA Mother   . Heart disease Sister   . Coronary artery disease Sister   . Heart disease Brother   . Coronary artery disease  Brother   . Heart disease Brother   . Coronary artery disease Brother     Social History   Tobacco Use  . Smoking status: Former Smoker    Quit date: 06/14/1994    Years since quitting: 25.3  . Smokeless tobacco: Never Used  Vaping Use  . Vaping Use: Never used  Substance Use Topics  . Alcohol use: No  . Drug use: No    Home Medications Prior to Admission medications   Medication Sig Start Date End Date Taking? Authorizing Provider  alfuzosin (UROXATRAL) 10 MG 24 hr tablet Take 10 mg by mouth daily. 09/07/19  Yes [provider]  amLODipine (NORVASC) 5 MG tablet Take 5 mg by mouth daily. 07/13/19  Yes [provider]  aspirin 81 MG tablet Take 81 mg by mouth every morning.    Yes [provider]  atorvastatin (LIPITOR) 40 MG tablet Take 40 mg by mouth at bedtime.    Yes [provider]  famotidine (PEPCID) 20 MG tablet Take 20 mg by mouth 2 (two) times daily.   Yes [provider]  hydrocortisone valerate cream (WESTCORT) 0.2 % Apply 1 application topically 2 (two) times daily.  09/20/19  Yes [provider]  isosorbide mononitrate (IMDUR) 60 MG 24 hr tablet Take 60 mg by mouth every evening.   Yes [provider]  lactulose (CHRONULAC) 10 GM/15ML solution Take 10 g by mouth as needed for mild constipation.   Yes [provider]  metFORMIN (  GLUCOPHAGE) 500 MG tablet Take 500 mg by mouth 2 (two) times daily. 09/20/19  Yes [provider]  metoprolol succinate (TOPROL-XL) 50 MG 24 hr tablet Take 50 mg by mouth 2 (two) times daily. 04/07/14  Yes [provider]  PROCTOSOL HC 2.5 % rectal cream Place 1 application rectally daily as needed for hemorrhoids or itching.  11/06/13  Yes [provider]  ramipril (ALTACE) 10 MG capsule Take 10 mg by mouth 2 (two) times daily.    Yes [provider]  RESTASIS 0.05 % ophthalmic emulsion Place 1 drop into both eyes 2 (two) times daily.  09/09/19   Yes [provider]  ondansetron (ZOFRAN ODT) 4 MG disintegrating tablet Take 1 tablet (4 mg total) by mouth every 8 (eight) hours as needed for nausea or vomiting. 10/09/19   Lucrezia Starch, MD  senna-docusate (SENOKOT-S) 8.6-50 MG tablet Take 1 tablet by mouth daily. 10/09/19   Lucrezia Starch, MD  [facility] injection Inject as directed once. 04/10/17   [provider]    Allergies    Losartan potassium  Review of Systems   Review of Systems  Constitutional: Negative for chills and fever.  HENT: Negative for ear pain and sore throat.   Eyes: Negative for pain and visual disturbance.  Respiratory: Negative for cough and shortness of breath.   Cardiovascular: Negative for chest pain and palpitations.  Gastrointestinal: Positive for abdominal pain, nausea and vomiting.  Genitourinary: Negative for dysuria and hematuria.  Musculoskeletal: Negative for arthralgias and back pain.  Skin: Negative for color change and rash.  Neurological: Negative for seizures and syncope.  All other systems reviewed and are negative.   Physical Exam Updated Vital Signs BP (!) 146/83   Pulse 89   Temp 98.2 F (36.8 C) (Oral)   Resp 16   Ht 5\' 10"  (1.778 m)   Wt 90.7 kg   SpO2 99%   BMI 28.70 kg/m   Physical Exam Vitals and nursing note reviewed.  Constitutional:      Appearance: He is well-developed.  HENT:     Head: Normocephalic and atraumatic.  Eyes:     Conjunctiva/sclera: Conjunctivae normal.  Cardiovascular:     Rate and Rhythm: Normal rate and regular rhythm.     Heart sounds: No murmur heard.   Pulmonary:     Effort: Pulmonary effort is normal. No respiratory distress.     Breath sounds: Normal breath sounds.  Abdominal:     General: Abdomen is flat. Bowel sounds are normal.     Palpations: Abdomen is soft.     Tenderness: There is abdominal tenderness in the right upper quadrant, epigastric area and left upper quadrant. There is no guarding or rebound.    Musculoskeletal:     Cervical back: Neck supple.  Skin:    General: Skin is warm and dry.  Neurological:     General: No focal deficit present.     Mental Status: He is alert and oriented to person, place, and time.  Psychiatric:        Mood and Affect: Mood normal.        Behavior: Behavior normal.     ED Results / Procedures / Treatments   Labs (all labs ordered are listed, but only abnormal results are displayed) Labs Reviewed  COMPREHENSIVE METABOLIC PANEL - Abnormal; Notable for the following components:      Result Value   Sodium 132 (*)    Potassium 5.5 (*)  Chloride 97 (*)    Glucose, Bld 157 (*)    AST 52 (*)    Total Bilirubin 1.7 (*)    All other components within normal limits  CBC - Abnormal; Notable for the following components:   MCV 79.9 (*)    RDW 17.0 (*)    All other components within normal limits  URINALYSIS, ROUTINE W REFLEX MICROSCOPIC - Abnormal; Notable for the following components:   Ketones, ur 5 (*)    Protein, ur >=300 (*)    All other components within normal limits  BASIC METABOLIC PANEL - Abnormal; Notable for the following components:   Sodium 133 (*)    Glucose, Bld 135 (*)    Calcium 8.7 (*)    All other components within normal limits  LIPASE, BLOOD    EKG None  Radiology CT ABDOMEN PELVIS W CONTRAST  Result Date: 10/09/2019 CLINICAL DATA:  Abdominal pain, constipation, and emesis. History of prostate cancer. EXAM: CT ABDOMEN AND PELVIS WITH CONTRAST TECHNIQUE: Multidetector CT imaging of the abdomen and pelvis was performed using the standard protocol following bolus administration of intravenous contrast. CONTRAST:  113mL OMNIPAQUE IOHEXOL 300 MG/ML  SOLN COMPARISON:  CT abdomen pelvis dated April 16, 2018. FINDINGS: Lower chest: No acute abnormality. Unchanged calcified granulomas in both lower lobes and small subpleural nodules in the right middle lobe. Hepatobiliary: No focal liver abnormality is seen. No gallstones or  gallbladder wall thickening. Unchanged mild intra- and extrahepatic biliary the prominence. Pancreas: Unchanged main pancreatic duct measuring up to 5 mm in diameter in the head of the pancreas. No surrounding inflammatory changes. Spleen: Normal in size without focal abnormality. Adrenals/Urinary Tract: Unchanged small bilateral renal cysts. Unchanged 12 mm enhancing lesion in the upper pole of the right knee (series 2, image 5). No renal calculi or hydronephrosis. The bladder is unremarkable. Stomach/Bowel: Stomach is within normal limits. Appendix appears normal. No evidence of bowel wall thickening, distention, or inflammatory changes. Mild left-sided colonic diverticulosis. Vascular/Lymphatic: Aortic atherosclerosis. No enlarged abdominal or pelvic lymph nodes. Reproductive: Unchanged fiduciary markers in the prostate. Other: No abdominal wall hernia or abnormality. No abdominopelvic ascites. No pneumoperitoneum. Musculoskeletal: No acute or significant osseous findings. IMPRESSION: 1. No acute intra-abdominal process. 2. Unchanged 12 mm enhancing lesion in the upper pole of the right kidney, stable since 2015, likely a low grade renal neoplasm. 3. Aortic Atherosclerosis (ICD10-I70.0). Electronically Signed   By: Titus Dubin M.D.   On: 10/09/2019 14:17    Procedures Procedures (including critical care time)  Medications Ordered in ED Medications  sodium chloride (PF) 0.9 % injection (has no administration in time range)  sodium chloride flush (NS) 0.9 % injection 3 mL (3 mLs Intravenous Given 10/09/19 1058)  ondansetron (ZOFRAN) injection 4 mg (4 mg Intravenous Given 10/09/19 1053)  fentaNYL (SUBLIMAZE) injection 50 mcg (50 mcg Intravenous Given 10/09/19 1053)  iohexol (OMNIPAQUE) 300 MG/ML solution 100 mL (100 mLs Intravenous Contrast Given 10/09/19 1324)  sodium phosphate (FLEET) 7-19 GM/118ML enema 1 enema (1 enema Rectal Given 10/09/19 1527)    ED Course  I have reviewed the triage vital  signs and the nursing notes.  Pertinent labs & imaging results that were available during my care of the patient were reviewed by me and considered in my medical decision making (see chart for details).    MDM Rules/Calculators/A&P                          86 year old  male presenting to ER with concern for abdominal pain, constipation, vomiting.  Here, patient is well-appearing, normal vital signs, did note some tenderness though no rebound or guarding on abdominal exam.  Lab work was grossly normal, UA negative.  CT abdomen pelvis was negative for acute pathology.  Suspect symptoms related to constipation.  Will give trial of enema in ER.  Recommended MiraLAX, senna docusate at home and recheck with PCP next week.    After the discussed management above, the patient was determined to be safe for discharge.  The patient was in agreement with this plan and all questions regarding their care were answered.  ED return precautions were discussed and the patient will return to the ED with any significant worsening of condition.    Final Clinical Impression(s) / ED Diagnoses Final diagnoses:  Generalized abdominal pain  Constipation, unspecified constipation type    Rx / DC Orders ED Discharge Orders         Ordered    senna-docusate (SENOKOT-S) 8.6-50 MG tablet  Daily     Discontinue  Reprint     10/09/19 1525    ondansetron (ZOFRAN ODT) 4 MG disintegrating tablet  Every 8 hours PRN     Discontinue  Reprint     10/09/19 1527           Lucrezia Starch, MD 10/09/19 1530

## 2019-10-09 NOTE — ED Triage Notes (Signed)
Patient reports that he has been constipated x 3 days. Patient also c/o abdominal pain and emesis.

## 2019-10-09 NOTE — ED Notes (Signed)
Pt was brought a bedside commode by NT. Pt was able to have a small bowel movement, and states he is ready to go home and try again there. Provider made aware.

## 2019-10-11 ENCOUNTER — Encounter (HOSPITAL_COMMUNITY): Payer: Self-pay | Admitting: Emergency Medicine

## 2019-10-11 ENCOUNTER — Emergency Department (EMERGENCY_DEPARTMENT_HOSPITAL)
Admission: EM | Admit: 2019-10-11 | Discharge: 2019-10-11 | Disposition: A | Discharge status: Home / Self Care | Payer: Medicare Other | Source: Home / Self Care | Attending: Emergency Medicine | Admitting: Emergency Medicine

## 2019-10-11 ENCOUNTER — Other Ambulatory Visit: Payer: Self-pay

## 2019-10-11 ENCOUNTER — Emergency Department (HOSPITAL_COMMUNITY): Payer: Medicare Other

## 2019-10-11 DIAGNOSIS — Z79899 Other long term (current) drug therapy: Secondary | ICD-10-CM | POA: Insufficient documentation

## 2019-10-11 DIAGNOSIS — R109 Unspecified abdominal pain: Secondary | ICD-10-CM

## 2019-10-11 DIAGNOSIS — Z8546 Personal history of malignant neoplasm of prostate: Secondary | ICD-10-CM | POA: Insufficient documentation

## 2019-10-11 DIAGNOSIS — Z20822 Contact with and (suspected) exposure to covid-19: Secondary | ICD-10-CM | POA: Insufficient documentation

## 2019-10-11 DIAGNOSIS — R110 Nausea: Secondary | ICD-10-CM | POA: Insufficient documentation

## 2019-10-11 DIAGNOSIS — I2510 Atherosclerotic heart disease of native coronary artery without angina pectoris: Secondary | ICD-10-CM | POA: Insufficient documentation

## 2019-10-11 DIAGNOSIS — Z7984 Long term (current) use of oral hypoglycemic drugs: Secondary | ICD-10-CM | POA: Insufficient documentation

## 2019-10-11 DIAGNOSIS — Z87891 Personal history of nicotine dependence: Secondary | ICD-10-CM | POA: Insufficient documentation

## 2019-10-11 DIAGNOSIS — E119 Type 2 diabetes mellitus without complications: Secondary | ICD-10-CM | POA: Insufficient documentation

## 2019-10-11 DIAGNOSIS — N289 Disorder of kidney and ureter, unspecified: Secondary | ICD-10-CM

## 2019-10-11 DIAGNOSIS — Z7982 Long term (current) use of aspirin: Secondary | ICD-10-CM | POA: Insufficient documentation

## 2019-10-11 DIAGNOSIS — R112 Nausea with vomiting, unspecified: Secondary | ICD-10-CM

## 2019-10-11 LAB — URINALYSIS, ROUTINE W REFLEX MICROSCOPIC
Bacteria, UA: NONE SEEN
Bilirubin Urine: NEGATIVE
Glucose, UA: NEGATIVE mg/dL
Hgb urine dipstick: NEGATIVE
Ketones, ur: NEGATIVE mg/dL
Leukocytes,Ua: NEGATIVE
Nitrite: NEGATIVE
Protein, ur: >=300 mg/dL — AB
Specific Gravity, Urine: 1.024 (ref 1.005–1.030)
pH: 6 (ref 5.0–8.0)

## 2019-10-11 LAB — COMPREHENSIVE METABOLIC PANEL
ALT: 22 U/L (ref 0–44)
AST: 31 U/L (ref 15–41)
Albumin: 3.9 g/dL (ref 3.5–5.0)
Alkaline Phosphatase: 55 U/L (ref 38–126)
Anion gap: 11 (ref 5–15)
BUN: 22 mg/dL (ref 8–23)
CO2: 24 mmol/L (ref 22–32)
Calcium: 8.9 mg/dL (ref 8.9–10.3)
Chloride: 98 mmol/L (ref 98–111)
Creatinine, Ser: 0.69 mg/dL (ref 0.61–1.24)
GFR calc Af Amer: >60 mL/min (ref >60)
GFR calc non Af Amer: >60 mL/min (ref >60)
Glucose, Bld: 141 mg/dL — ABNORMAL HIGH (ref 70–99)
Potassium: 3.1 mmol/L — ABNORMAL LOW (ref 3.5–5.1)
Sodium: 133 mmol/L — ABNORMAL LOW (ref 135–145)
Total Bilirubin: 1.5 mg/dL — ABNORMAL HIGH (ref 0.3–1.2)
Total Protein: 7 g/dL (ref 6.5–8.1)

## 2019-10-11 LAB — LACTIC ACID, PLASMA: Lactic Acid, Venous: 1.7 mmol/L (ref 0.5–1.9)

## 2019-10-11 LAB — CBC
HCT: 39.4 % (ref 39.0–52.0)
Hemoglobin: 13.1 g/dL (ref 13.0–17.0)
MCH: 26.3 pg (ref 26.0–34.0)
MCHC: 33.2 g/dL (ref 30.0–36.0)
MCV: 79 fL — ABNORMAL LOW (ref 80.0–100.0)
Platelets: 266 10*3/uL (ref 150–400)
RBC: 4.99 MIL/uL (ref 4.22–5.81)
RDW: 16.9 % — ABNORMAL HIGH (ref 11.5–15.5)
WBC: 10.2 10*3/uL (ref 4.0–10.5)
nRBC: 0 % (ref 0.0–0.2)

## 2019-10-11 LAB — SARS CORONAVIRUS 2 BY RT PCR (HOSPITAL ORDER, PERFORMED IN ~~LOC~~ HOSPITAL LAB): SARS Coronavirus 2: NEGATIVE

## 2019-10-11 LAB — LIPASE, BLOOD: Lipase: 36 U/L (ref 11–51)

## 2019-10-11 MED ORDER — ONDANSETRON 4 MG PO TBDP
4.0000 mg | ORAL_TABLET | Freq: Once | ORAL | Status: AC | PRN
  Administered 2019-10-11: 4 mg via ORAL
  Filled 2019-10-11: qty 1

## 2019-10-11 MED ORDER — ONDANSETRON HCL 4 MG/2ML IJ SOLN
4.0000 mg | Freq: Once | INTRAMUSCULAR | Status: AC
  Administered 2019-10-11: 4 mg via INTRAVENOUS
  Filled 2019-10-11: qty 2

## 2019-10-11 MED ORDER — POTASSIUM CHLORIDE 10 MEQ/100ML IV SOLN
10.0000 meq | Freq: Once | INTRAVENOUS | Status: AC
  Administered 2019-10-11: 10 meq via INTRAVENOUS
  Filled 2019-10-11: qty 100

## 2019-10-11 MED ORDER — DICYCLOMINE HCL 20 MG PO TABS
20.0000 mg | ORAL_TABLET | Freq: Two times a day (BID) | ORAL | 0 refills | Status: DC
Start: 2019-10-11 — End: 2019-12-01

## 2019-10-11 MED ORDER — MORPHINE SULFATE (PF) 4 MG/ML IV SOLN
4.0000 mg | Freq: Once | INTRAVENOUS | Status: AC
  Administered 2019-10-11: 4 mg via INTRAVENOUS
  Filled 2019-10-11: qty 1

## 2019-10-11 MED ORDER — SODIUM CHLORIDE 0.9% FLUSH
3.0000 mL | Freq: Once | INTRAVENOUS | Status: AC
  Administered 2019-10-11: 3 mL via INTRAVENOUS

## 2019-10-11 MED ORDER — IOHEXOL 300 MG/ML  SOLN: 100.0000 mL | Freq: Once | INTRAMUSCULAR | Status: DC | PRN

## 2019-10-11 MED ORDER — PROMETHAZINE HCL 12.5 MG PO TABS
12.5000 mg | ORAL_TABLET | Freq: Three times a day (TID) | ORAL | 0 refills | Status: DC | PRN
Start: 2019-10-11 — End: 2019-10-12

## 2019-10-11 MED ORDER — POTASSIUM CHLORIDE ER 10 MEQ PO TBCR
20.0000 meq | EXTENDED_RELEASE_TABLET | Freq: Every day | ORAL | 0 refills | Status: DC
Start: 2019-10-11 — End: 2019-12-01

## 2019-10-11 MED ORDER — SODIUM CHLORIDE 0.9 % IV BOLUS
1000.0000 mL | Freq: Once | INTRAVENOUS | Status: AC
  Administered 2019-10-11: 1000 mL via INTRAVENOUS

## 2019-10-11 MED ORDER — IOHEXOL 350 MG/ML SOLN
100.0000 mL | Freq: Once | INTRAVENOUS | Status: AC | PRN
  Administered 2019-10-11: 100 mL via INTRAVENOUS

## 2019-10-11 MED ORDER — SODIUM CHLORIDE (PF) 0.9 % IJ SOLN
INTRAMUSCULAR | Status: AC
  Filled 2019-10-11: qty 50

## 2019-10-11 MED ORDER — HYDROMORPHONE HCL 1 MG/ML IJ SOLN
0.5000 mg | Freq: Once | INTRAMUSCULAR | Status: AC
  Administered 2019-10-11: 0.5 mg via INTRAVENOUS
  Filled 2019-10-11: qty 1

## 2019-10-11 NOTE — ED Triage Notes (Signed)
Pt was here on Friday for abd pains and constipation. Pt PCP advised to come back to Ed for further evaluation for possible ischemic bowel due to still having abd pains. Pt denies vomiting but having nausea. Reports had BM yesterday.

## 2019-10-11 NOTE — ED Provider Notes (Signed)
[facility] DEPT Provider Note   CSN: [id number] Arrival date & time: 10/11/19  1150     History Chief Complaint  Patient presents with  . Abdominal Pain    Matthew Schwartz is a 86 y.o. male history of diabetes, GERD, dyslipidemia, hypertension, prostate cancer.  Patient presents today for continued abdominal pain.  Patient reports 4-5 days of abdominal pain described as a generalized cramping sharp sensation constant nonradiating no aggravating or alleviating factors.  Reports one episode of nonbloody/nonbilious emesis today and continued nausea.  Reports one bowel movement yesterday without blood but feels that he has had decreased bowel movements over the past several days.  Patient reports last flatulence was upon ER arrival.  Patient reports that he was evaluated in the ER 2 days ago for the same problem.  He has had no improvement since that time.  He reports that he [facility] physician [facility] and was told to come to the ER for evaluation of ischemic bowel.  Denies fever/chills, headache, chest pain/shortness of breath, fall/injury, dysuria/hematuria or any additional concerns. HPI     Past Medical History:  Diagnosis Date  . Diabetes mellitus without complication ([place])   . Dyslipidemia   . GERD (gastroesophageal reflux disease)   . Hypertension   . Prostate cancer ([place]) 12/23/13   Adenocarcinoma  . PSA elevation   . S/P radiation therapy 05/18/2014 through 07/16/2014    Prostate 7800 cGy in 40 sessions, seminal vesicles 5600 cGy in 40 sessions     Patient Active Problem List   Diagnosis Date Noted  . Anemia 09/07/2016  . Coronary artery disease due to lipid rich plaque 02/16/2015  . Hyperlipidemia 02/16/2015  . Malignant neoplasm of prostate ([place]) 02/18/2014  . HTN (hypertension) 06/14/2013  . Dyslipidemia 06/14/2013  . GERD (gastroesophageal reflux disease)  06/14/2013    Past Surgical History:  Procedure Laterality Date  . ANGIOPLASTY  2000  . cataract surgery  2000  . COLONOSCOPY    . PROSTATE BIOPSY  12/23/13  . REPLACEMENT TOTAL KNEE  2008/2009   bilateral       Family History  Problem Relation Age of Onset  . CVA Mother   . Heart disease Sister   . Coronary artery disease Sister   . Heart disease Brother   . Coronary artery disease Brother   . Heart disease Brother   . Coronary artery disease Brother     Social History   Tobacco Use  . Smoking status: Former Smoker    Quit date: 06/14/1994    Years since quitting: 25.3  . Smokeless tobacco: Never Used  Vaping Use  . Vaping Use: Never used  Substance Use Topics  . Alcohol use: No  . Drug use: No    Home Medications Prior to Admission medications   Medication Sig Start Date End Date Taking? Authorizing Provider  alfuzosin (UROXATRAL) 10 MG 24 hr tablet Take 10 mg by mouth daily. 09/07/19   [provider]  amLODipine (NORVASC) 5 MG tablet Take 5 mg by mouth daily. 07/13/19   [provider]  aspirin 81 MG tablet Take 81 mg by mouth every morning.     [provider]  atorvastatin (LIPITOR) 40 MG tablet Take 40 mg by mouth at bedtime.     [provider]  dicyclomine (BENTYL) 20 MG tablet Take 1 tablet (20 mg total) by mouth 2 (two) times daily. 10/11/19   Nuala Alpha A, PA-C  famotidine (PEPCID) 20 MG  tablet Take 20 mg by mouth 2 (two) times daily.    [provider]  hydrocortisone valerate cream (WESTCORT) 0.2 % Apply 1 application topically 2 (two) times daily.  09/20/19   [provider]  isosorbide mononitrate (IMDUR) 60 MG 24 hr tablet Take 60 mg by mouth every evening.    [provider]  lactulose (CHRONULAC) 10 GM/15ML solution Take 10 g by mouth as needed for mild constipation.    [provider]  metFORMIN (GLUCOPHAGE) 500 MG tablet Take 500 mg by mouth 2 (two) times daily. 09/20/19    [provider]  metoprolol succinate (TOPROL-XL) 50 MG 24 hr tablet Take 50 mg by mouth 2 (two) times daily. 04/07/14   [provider]  potassium chloride (KLOR-CON) 10 MEQ tablet Take 2 tablets (20 mEq total) by mouth daily for 3 days. 10/11/19 10/14/19  Nuala Alpha A, PA-C  PROCTOSOL HC 2.5 % rectal cream Place 1 application rectally daily as needed for hemorrhoids or itching.  11/06/13   [provider]  promethazine (PHENERGAN) 12.5 MG tablet Take 1 tablet (12.5 mg total) by mouth every 8 (eight) hours as needed for up to 3 days for nausea or vomiting. 10/11/19 10/14/19  Nuala Alpha A, PA-C  ramipril (ALTACE) 10 MG capsule Take 10 mg by mouth 2 (two) times daily.     [provider]  RESTASIS 0.05 % ophthalmic emulsion Place 1 drop into both eyes 2 (two) times daily.  09/09/19   [provider]  senna-docusate (SENOKOT-S) 8.6-50 MG tablet Take 1 tablet by mouth daily. 10/09/19   Lucrezia Starch, MD  [facility] injection Inject as directed once. 04/10/17   [provider]    Allergies    Losartan potassium  Review of Systems   Review of Systems Ten systems are reviewed and are negative for acute change except as noted in the HPI  Physical Exam Updated Vital Signs BP (!) 147/61   Pulse (!) 59   Temp 98.5 F (36.9 C) (Oral)   Resp 18   SpO2 97%   Physical Exam Constitutional:      General: He is not in acute distress.    Appearance: Normal appearance. He is well-developed. He is not ill-appearing or diaphoretic.  HENT:     Head: Normocephalic and atraumatic.  Eyes:     General: Vision grossly intact. Gaze aligned appropriately.     Pupils: Pupils are equal, round, and reactive to light.  Neck:     Trachea: Trachea and phonation normal.  Pulmonary:     Effort: Pulmonary effort is normal. No respiratory distress.  Abdominal:     General: There is no distension.     Palpations: Abdomen is soft.     Tenderness: There is  generalized abdominal tenderness. There is no guarding or rebound.  Musculoskeletal:        General: Normal range of motion.     Cervical back: Normal range of motion.  Skin:    General: Skin is warm and dry.  Neurological:     Mental Status: He is alert.     GCS: GCS eye subscore is 4. GCS verbal subscore is 5. GCS motor subscore is 6.     Comments: Speech is clear and goal oriented, follows commands Major Cranial nerves without deficit, no facial droop Moves extremities without ataxia, coordination intact  Psychiatric:        Behavior: Behavior normal.     ED Results / Procedures / Treatments  Labs (all labs ordered are listed, but only abnormal results are displayed) Labs Reviewed  COMPREHENSIVE METABOLIC PANEL - Abnormal; Notable for the following components:      Result Value   Sodium 133 (*)    Potassium 3.1 (*)    Glucose, Bld 141 (*)    Total Bilirubin 1.5 (*)    All other components within normal limits  CBC - Abnormal; Notable for the following components:   MCV 79.0 (*)    RDW 16.9 (*)    All other components within normal limits  URINALYSIS, ROUTINE W REFLEX MICROSCOPIC - Abnormal; Notable for the following components:   Protein, ur >=300 (*)    All other components within normal limits  SARS CORONAVIRUS 2 BY RT PCR (HOSPITAL ORDER, [facility])  LIPASE, BLOOD  LACTIC ACID, PLASMA    EKG EKG Interpretation  Date/Time:  Sunday October 11 2019 15:50:46 EDT Ventricular Rate:  57 PR Interval:    QRS Duration: 120 QT Interval:  576 QTC Calculation: 561 R Axis:   -40 Text Interpretation: Sinus rhythm Multiform ventricular premature complexes Left anterior fascicular block LVH with secondary repolarization abnormality Confirmed by Virgel Manifold [phone]) on 10/11/2019 5:42:23 PM   Radiology CT Angio Abd/Pel W and/or Wo Contrast  Result Date: 10/11/2019 CLINICAL DATA:  Persistent abdominal pain. Clinical concern for ischemia. EXAM: CTA  ABDOMEN AND PELVIS WITHOUT AND WITH CONTRAST TECHNIQUE: Multidetector CT imaging of the abdomen and pelvis was performed using the standard protocol during bolus administration of intravenous contrast. Multiplanar reconstructed images and MIPs were obtained and reviewed to evaluate the vascular anatomy. CONTRAST:  1100mL OMNIPAQUE IOHEXOL 350 MG/ML SOLN COMPARISON:  Contrast-enhanced CT 2 days ago. FINDINGS: Place not patient motion artifact limits assessment. VASCULAR Aorta: Atherosclerosis and tortuosity, mild-to-moderate. No aneurysm, dissection, or significant stenosis. No evidence of vasculitis. Celiac: Mild plaque at the origin but no significant stenosis. No dissection or acute findings. Branch vessels are patent, partially obscured by motion. SMA: Mild plaque in the SMA without significant stenosis. No dissection or vasculitis. Replaced right hepatic artery arising from the SMA. Mesenteric branch vessels are patent, allowing for motion limitations. Renals: Minor plaque at the origins without significant stenosis. No dissection or vasculitis. Single bilateral renal arteries. IMA: Patent without significant stenosis. Inflow: Mild atherosclerosis. No aneurysm, significant stenosis, dissection or vasculitis. Proximal Outflow: Bilateral common femoral and visualized portions of the superficial and profunda femoral arteries are patent without evidence of aneurysm, dissection, vasculitis or significant stenosis. Veins: Venous phase imaging demonstrates patency of the portal and superior mesenteric veins. No evidence of mesenteric venous thrombosis. IVC is unremarkable. Review of the MIP images confirms the above findings. NON-VASCULAR Lower chest: No acute findings. Tiny subpleural nodule in the right lower lobe is unchanged. Calcified granuloma at the lung bases. Hepatobiliary: No focal hepatic lesion. Minimal focal fatty infiltration adjacent to the falciform ligament. Physiologically distended gallbladder without  calcified gallstone. Stable mild biliary dilatation with common bile duct measuring 12 mm, unchanged. Pancreas: Stable proximal pancreatic ductal prominence 5 mm. No peripancreatic inflammation. No evidence of pancreatic mass. Spleen: Normal in size without focal abnormality. Adrenals/Urinary Tract: No adrenal nodule. No hydronephrosis. Multiple bilateral low-density lesions in both kidneys, incompletely characterized. Possibly enhancing right upper pole 12 mm lesion, series 11, image 57, unchanged over the past 2 days. Urinary bladder is unremarkable. Stomach/Bowel: Stomach partially distended. Equivocal pre pyloric gastric wall thickening versus peristalsis. No small bowel obstruction or inflammatory change. Scattered intraluminal fluid within pelvic  small bowel, nonspecific. Normal appendix. Moderate volume of formed stool in the ascending and transverse colon. Small volume of formed stool in the descending colon. Scattered descending and sigmoid colonic diverticulosis without diverticulitis. Sigmoid colon is redundant. There is minimal liquid stool in the distal sigmoid colon and rectum. No colonic wall thickening or pericolonic edema, particularly at the splenic flexure. No findings to suggest bowel ischemia. No pneumatosis. Lymphatic: No adenopathy. Reproductive: No acute findings.  Stable markers in the prostate. Other: No free air, free fluid, or intra-abdominal fluid collection. Musculoskeletal: There are no acute or suspicious osseous abnormalities. IMPRESSION: VASCULAR. 1. Mild aortic and branch atherosclerosis. No acute vascular findings. 2. No significant arterial stenosis of the mesenteric vasculature. Mesenteric vessels are widely patent. NON-VASCULAR. 1. No findings to suggest bowel ischemia. 2. Colonic diverticulosis without diverticulitis. Moderate stool in the proximal colon. 3. Equivocal pre pyloric gastric wall thickening versus peristalsis. 4. Bilateral renal cysts. Possibly enhancing 12 mm  lesion upper pole right kidney is unchanged, possibly located renal neoplasm. 5. Stable biliary dilatation. Aortic Atherosclerosis (ICD10-I70.0). Electronically Signed   By: Keith Rake M.D.   On: 10/11/2019 15:55    Procedures Procedures (including critical care time)  Medications Ordered in ED Medications  sodium chloride (PF) 0.9 % injection (has no administration in time range)  sodium chloride flush (NS) 0.9 % injection 3 mL (3 mLs Intravenous Given 10/11/19 1432)  ondansetron (ZOFRAN-ODT) disintegrating tablet 4 mg (4 mg Oral Given 10/11/19 1205)  iohexol (OMNIPAQUE) 350 MG/ML injection 100 mL (100 mLs Intravenous Contrast Given 10/11/19 1507)  sodium chloride 0.9 % bolus 1,000 mL (0 mLs Intravenous Stopped 10/11/19 1751)  potassium chloride 10 mEq in 100 mL IVPB (0 mEq Intravenous Stopped 10/11/19 1651)  morphine 4 MG/ML injection 4 mg (4 mg Intravenous Given 10/11/19 1535)  ondansetron (ZOFRAN) injection 4 mg (4 mg Intravenous Given 10/11/19 1654)  HYDROmorphone (DILAUDID) injection 0.5 mg (0.5 mg Intravenous Given 10/11/19 1655)    ED Course  I have reviewed the triage vital signs and the nursing notes.  Pertinent labs & imaging results that were available during my care of the patient were reviewed by me and considered in my medical decision making (see chart for details).  Clinical Course as of Oct 10 1844  Sun Oct 11, 2019  1741 Dr. Lonny Prude   [BM]    Clinical Course User Index [BM] Gari Crown   MDM Rules/Calculators/A&P                         Additional History Obtained: 1. Nursing notes from this visit. 2. Patient's wife at bedside. 3. Reviewed patient's ED visit on 10/09/2019.  Diagnosis of generalized abdominal pain and constipation.  CBC unremarkable, CMP nonacute, lipase within normal limits, follow-up BMP reassuring, CT abdomen pelvis without acute process.  Patient received fentanyl, Zofran, sodium phosphate enema.  I have tried multiple times to  call [facility] physicians group to speak with provider on call for further elaboration however there is no answer. --- I ordered, reviewed and interpreted labs which include: Lactic acid 1.7 is reassuring Lipase within normal limits doubt pancreatitis CBC shows no leukocytosis to suggest infection and no evidence of anemia CMP shows mild hypokalemia of 3.1 and mild hyponatremia suspect this is secondary to decreased p.o. intake.  Will replete here in the ER. Urinalysis shows no evidence of infection  CT angio AP:  IMPRESSION:  VASCULAR.    1. Mild aortic and branch  atherosclerosis. No acute vascular  findings.  2. No significant arterial stenosis of the mesenteric vasculature.  Mesenteric vessels are widely patent.    NON-VASCULAR.    1. No findings to suggest bowel ischemia.  2. Colonic diverticulosis without diverticulitis. Moderate stool in  the proximal colon.  3. Equivocal pre pyloric gastric wall thickening versus peristalsis.  4. Bilateral renal cysts. Possibly enhancing 12 mm lesion upper pole  right kidney is unchanged, possibly located renal neoplasm.  5. Stable biliary dilatation.    Aortic Atherosclerosis (ICD10-I70.0).  - Patient received ODT Zofran, 4 mg IV morphine, 1 run of potassium.  In reassessment he is uncomfortable appearing but reports continued pain and nausea and is requesting additional medication.  I discussed results of above in detail with patient and his wife will give additional medication to help with symptoms possibly a viral gastroenteritis.  EKG was ordered for evaluation of QT, it reads as QTC 561, discussed this with Dr. Wilson Singer who reviewed patient's EKG likely secondary to bigeminy, no evidence of significant QT prolongation agrees with 4 mg IV Zofran.  Will reassess patient. - Patient reassessed multiple times, reports continued abdominal pain and nausea.  Given symptoms have not improved despite use of ODT Zofran at home and patient still  unwilling to eat or drink, plan of care is consult to hospitalist service for admission for intractable nausea and abdominal pain. Discussed plan with Dr. Wilson Singer who agrees.  Screening Covid test added, patient reports he has had both doses of his Covid vaccine. - 5:41 PM: Discussed case with hospitalist Dr. Lonny Prude.  He has asked that patient be p.o. challenged, does not feel patient requires admission at this time. - Patient tolerated water and a cheese stick without recurrence of emesis. - Patient seen and evaluated by Dr. Wilson Singer, plan of care is to switch patient from Zofran to Phenergan and give him 4 pills of Bentyl.  Patient will follow up with his PCP and return for any new or worsening symptoms.  Patient is aware of pending Covid test and will follow up on the results on his MyChart account.   At this time there does not appear to be any evidence of an acute emergency medical condition and the patient appears stable for discharge with appropriate outpatient follow up. Diagnosis was discussed with patient who verbalizes understanding of care plan and is agreeable to discharge. I have discussed return precautions with patient and and wife who verbalizes understanding. Patient encouraged to follow-up with their PCP. All questions answered.   Note: Portions of this report may have been transcribed using voice recognition software. Every effort was made to ensure accuracy; however, inadvertent computerized transcription errors may still be present. Final Clinical Impression(s) / ED Diagnoses Final diagnoses:  Abdominal pain, unspecified abdominal location  Renal lesion  Non-intractable vomiting with nausea, unspecified vomiting type    Rx / DC Orders ED Discharge Orders         Ordered    dicyclomine (BENTYL) 20 MG tablet  2 times daily     Discontinue  Reprint     10/11/19 1844    promethazine (PHENERGAN) 12.5 MG tablet  Every 8 hours PRN     Discontinue  Reprint     10/11/19 1844     potassium chloride (KLOR-CON) 10 MEQ tablet  Daily     Discontinue  Reprint     10/11/19 1846           Deliah Boston, PA-C 10/11/19 1847  Virgel Manifold, MD 10/13/19 [phone]

## 2019-10-11 NOTE — Discharge Instructions (Addendum)
At this time there does not appear to be the presence of an emergent medical condition, however there is always the potential for conditions to change. Please read and follow the below instructions.  Please return to the Emergency Department immediately for any new or worsening symptoms or if your symptoms do not improve within 2 days. Please be sure to follow up with your Primary Care Provider within one week regarding your visit today; please call their office to schedule an appointment even if you are feeling better for a follow-up visit. Your CT scan showed a 12 mm lesion in the upper pole of your right kidney, please discuss this with your primary care doctor at your follow-up visit as further evaluation may be needed.  Additionally your CT scan showed bilateral renal cysts, diverticulosis, atherosclerosis, subpleural nodule in the right lower lobe of your lung, calcified granulomas of your lung bases, fatty infiltration of the liver, mild stable biliary duct dilation.  Discussed these incidental findings with your primary care doctor at your follow-up visit.  Additionally your CT scan showed equivocal prepyloric gastric wall thickening versus peristalsis, they should discuss this with your primary care doctor at your follow-up visit.  All of your results will be available on your MyChart account and discuss them in full with your primary care doctor. Your Covid test is pending and will result in the next 1 day, check your results on your MyChart account and discuss them with your primary care doctor. Stop taking the Zofran you are prescribed last time since it is not helping you.  You may use the Phenergan instead to help with nausea and vomiting.  Phenergan may make you drowsy so do not perform any dangerous activities after taking Phenergan.  You may also take the medication Bentyl as prescribed to help with your symptoms. Take the potassium supplement K-Dur as prescribed for the next 3 days.  Have  your potassium level rechecked by your primary care doctor at your follow-up visit.  Get help right away if: You have pain in your chest, neck, arm, or jaw. You feel very weak or you pass out (faint). You throw up again and again. You have throw up that is bright red or looks like black coffee grounds. You have bloody or black poop (stools) or poop that looks like tar. You have a very bad headache, a stiff neck, or both. You have very bad pain, cramping, or bloating in your belly (abdomen). You have trouble breathing. You are breathing very quickly. Your heart is beating very quickly. Your skin feels cold and clammy. You have a fever You feel confused. You have signs of losing too much water in your body, such as: Dark pee, very little pee, or no pee. Cracked lips. Dry mouth. Sunken eyes. Sleepiness. Weakness. You have any new/concerning or worsening of symptoms  Please read the additional information packets attached to your discharge summary.  Do not take your medicine if  develop an itchy rash, swelling in your mouth or lips, or difficulty breathing; call 911 and seek immediate emergency medical attention if this occurs.  Note: Portions of this text may have been transcribed using voice recognition software. Every effort was made to ensure accuracy; however, inadvertent computerized transcription errors may still be present.

## 2019-10-11 NOTE — ED Notes (Signed)
Patient has a extra gold in the main lab

## 2019-10-11 NOTE — Progress Notes (Signed)
Called to consider admission. At time of call, patient without clear criteria for admission. No emesis after administration of antiemetics. No significant lab abnormalities and CT scan without significant acute process. Recommended PO challenge and if patient fails and is having emesis, would be adequate reason for admission and IV fluids/antiemetics overnight. Would also be important to help alleviate constipation which are likely contributing to symptoms. This was discussed with the EDP.  Cordelia Poche, MD [facility] 10/11/2019, 6:59 PM

## 2019-10-12 ENCOUNTER — Observation Stay (HOSPITAL_COMMUNITY): Payer: Medicare Other | Admitting: Anesthesiology

## 2019-10-12 ENCOUNTER — Observation Stay (HOSPITAL_COMMUNITY): Payer: Medicare Other

## 2019-10-12 ENCOUNTER — Inpatient Hospital Stay (HOSPITAL_COMMUNITY)
Admission: EM | Admit: 2019-10-12 | Discharge: 2019-12-01 | DRG: 853 | Disposition: A | Discharge status: Hospice | Payer: Medicare Other | Attending: Internal Medicine | Admitting: Internal Medicine

## 2019-10-12 ENCOUNTER — Other Ambulatory Visit: Payer: Self-pay

## 2019-10-12 ENCOUNTER — Inpatient Hospital Stay (HOSPITAL_COMMUNITY): Payer: Medicare Other

## 2019-10-12 ENCOUNTER — Encounter (HOSPITAL_COMMUNITY): Payer: Self-pay

## 2019-10-12 ENCOUNTER — Encounter (HOSPITAL_COMMUNITY)
Admission: EM | Disposition: A | Discharge status: Hospice | Payer: Self-pay | Source: Home / Self Care | Attending: Internal Medicine

## 2019-10-12 DIAGNOSIS — R6521 Severe sepsis with septic shock: Secondary | ICD-10-CM | POA: Diagnosis present

## 2019-10-12 DIAGNOSIS — R109 Unspecified abdominal pain: Secondary | ICD-10-CM | POA: Diagnosis not present

## 2019-10-12 DIAGNOSIS — K255 Chronic or unspecified gastric ulcer with perforation: Secondary | ICD-10-CM | POA: Diagnosis not present

## 2019-10-12 DIAGNOSIS — I110 Hypertensive heart disease with heart failure: Secondary | ICD-10-CM | POA: Diagnosis not present

## 2019-10-12 DIAGNOSIS — T402X5A Adverse effect of other opioids, initial encounter: Secondary | ICD-10-CM | POA: Diagnosis not present

## 2019-10-12 DIAGNOSIS — R112 Nausea with vomiting, unspecified: Secondary | ICD-10-CM | POA: Diagnosis not present

## 2019-10-12 DIAGNOSIS — Y848 Other medical procedures as the cause of abnormal reaction of the patient, or of later complication, without mention of misadventure at the time of the procedure: Secondary | ICD-10-CM | POA: Diagnosis not present

## 2019-10-12 DIAGNOSIS — R1013 Epigastric pain: Secondary | ICD-10-CM | POA: Diagnosis not present

## 2019-10-12 DIAGNOSIS — E781 Pure hyperglyceridemia: Secondary | ICD-10-CM | POA: Diagnosis present

## 2019-10-12 DIAGNOSIS — Z20822 Contact with and (suspected) exposure to covid-19: Secondary | ICD-10-CM | POA: Diagnosis present

## 2019-10-12 DIAGNOSIS — K650 Generalized (acute) peritonitis: Secondary | ICD-10-CM | POA: Diagnosis present

## 2019-10-12 DIAGNOSIS — Z0189 Encounter for other specified special examinations: Secondary | ICD-10-CM

## 2019-10-12 DIAGNOSIS — T82868A Thrombosis of vascular prosthetic devices, implants and grafts, initial encounter: Secondary | ICD-10-CM | POA: Diagnosis not present

## 2019-10-12 DIAGNOSIS — I5021 Acute systolic (congestive) heart failure: Secondary | ICD-10-CM | POA: Diagnosis not present

## 2019-10-12 DIAGNOSIS — Z7189 Other specified counseling: Secondary | ICD-10-CM

## 2019-10-12 DIAGNOSIS — Z66 Do not resuscitate: Secondary | ICD-10-CM | POA: Diagnosis not present

## 2019-10-12 DIAGNOSIS — I2510 Atherosclerotic heart disease of native coronary artery without angina pectoris: Secondary | ICD-10-CM | POA: Diagnosis present

## 2019-10-12 DIAGNOSIS — E785 Hyperlipidemia, unspecified: Secondary | ICD-10-CM | POA: Diagnosis present

## 2019-10-12 DIAGNOSIS — I959 Hypotension, unspecified: Secondary | ICD-10-CM

## 2019-10-12 DIAGNOSIS — Z888 Allergy status to other drugs, medicaments and biological substances status: Secondary | ICD-10-CM

## 2019-10-12 DIAGNOSIS — K627 Radiation proctitis: Secondary | ICD-10-CM | POA: Diagnosis present

## 2019-10-12 DIAGNOSIS — L0291 Cutaneous abscess, unspecified: Secondary | ICD-10-CM | POA: Diagnosis not present

## 2019-10-12 DIAGNOSIS — K265 Chronic or unspecified duodenal ulcer with perforation: Secondary | ICD-10-CM | POA: Diagnosis present

## 2019-10-12 DIAGNOSIS — K259 Gastric ulcer, unspecified as acute or chronic, without hemorrhage or perforation: Secondary | ICD-10-CM | POA: Diagnosis present

## 2019-10-12 DIAGNOSIS — Z515 Encounter for palliative care: Secondary | ICD-10-CM | POA: Diagnosis not present

## 2019-10-12 DIAGNOSIS — I82A12 Acute embolism and thrombosis of left axillary vein: Secondary | ICD-10-CM | POA: Diagnosis present

## 2019-10-12 DIAGNOSIS — I9589 Other hypotension: Secondary | ICD-10-CM | POA: Diagnosis not present

## 2019-10-12 DIAGNOSIS — I493 Ventricular premature depolarization: Secondary | ICD-10-CM | POA: Diagnosis not present

## 2019-10-12 DIAGNOSIS — R531 Weakness: Secondary | ICD-10-CM | POA: Diagnosis not present

## 2019-10-12 DIAGNOSIS — C61 Malignant neoplasm of prostate: Secondary | ICD-10-CM | POA: Diagnosis not present

## 2019-10-12 DIAGNOSIS — R509 Fever, unspecified: Secondary | ICD-10-CM

## 2019-10-12 DIAGNOSIS — A419 Sepsis, unspecified organism: Secondary | ICD-10-CM | POA: Diagnosis present

## 2019-10-12 DIAGNOSIS — I5043 Acute on chronic combined systolic (congestive) and diastolic (congestive) heart failure: Secondary | ICD-10-CM | POA: Diagnosis present

## 2019-10-12 DIAGNOSIS — R008 Other abnormalities of heart beat: Secondary | ICD-10-CM | POA: Diagnosis not present

## 2019-10-12 DIAGNOSIS — Z01818 Encounter for other preprocedural examination: Secondary | ICD-10-CM

## 2019-10-12 DIAGNOSIS — E43 Unspecified severe protein-calorie malnutrition: Secondary | ICD-10-CM | POA: Diagnosis present

## 2019-10-12 DIAGNOSIS — Z87891 Personal history of nicotine dependence: Secondary | ICD-10-CM

## 2019-10-12 DIAGNOSIS — K661 Hemoperitoneum: Secondary | ICD-10-CM | POA: Diagnosis not present

## 2019-10-12 DIAGNOSIS — K567 Ileus, unspecified: Secondary | ICD-10-CM | POA: Diagnosis not present

## 2019-10-12 DIAGNOSIS — L988 Other specified disorders of the skin and subcutaneous tissue: Secondary | ICD-10-CM

## 2019-10-12 DIAGNOSIS — R140 Abdominal distension (gaseous): Secondary | ICD-10-CM

## 2019-10-12 DIAGNOSIS — K219 Gastro-esophageal reflux disease without esophagitis: Secondary | ICD-10-CM | POA: Diagnosis present

## 2019-10-12 DIAGNOSIS — I472 Ventricular tachycardia: Secondary | ICD-10-CM | POA: Diagnosis present

## 2019-10-12 DIAGNOSIS — I809 Phlebitis and thrombophlebitis of unspecified site: Secondary | ICD-10-CM | POA: Diagnosis not present

## 2019-10-12 DIAGNOSIS — I25119 Atherosclerotic heart disease of native coronary artery with unspecified angina pectoris: Secondary | ICD-10-CM | POA: Diagnosis not present

## 2019-10-12 DIAGNOSIS — I429 Cardiomyopathy, unspecified: Secondary | ICD-10-CM | POA: Diagnosis present

## 2019-10-12 DIAGNOSIS — K5909 Other constipation: Secondary | ICD-10-CM | POA: Diagnosis present

## 2019-10-12 DIAGNOSIS — B3789 Other sites of candidiasis: Secondary | ICD-10-CM | POA: Diagnosis not present

## 2019-10-12 DIAGNOSIS — R578 Other shock: Secondary | ICD-10-CM | POA: Diagnosis not present

## 2019-10-12 DIAGNOSIS — K5903 Drug induced constipation: Secondary | ICD-10-CM | POA: Diagnosis not present

## 2019-10-12 DIAGNOSIS — I447 Left bundle-branch block, unspecified: Secondary | ICD-10-CM | POA: Diagnosis present

## 2019-10-12 DIAGNOSIS — D6489 Other specified anemias: Secondary | ICD-10-CM | POA: Diagnosis present

## 2019-10-12 DIAGNOSIS — R188 Other ascites: Secondary | ICD-10-CM | POA: Diagnosis present

## 2019-10-12 DIAGNOSIS — I5041 Acute combined systolic (congestive) and diastolic (congestive) heart failure: Secondary | ICD-10-CM | POA: Diagnosis not present

## 2019-10-12 DIAGNOSIS — R197 Diarrhea, unspecified: Secondary | ICD-10-CM

## 2019-10-12 DIAGNOSIS — J95821 Acute postprocedural respiratory failure: Secondary | ICD-10-CM | POA: Diagnosis not present

## 2019-10-12 DIAGNOSIS — Z9861 Coronary angioplasty status: Secondary | ICD-10-CM

## 2019-10-12 DIAGNOSIS — K631 Perforation of intestine (nontraumatic): Secondary | ICD-10-CM | POA: Diagnosis present

## 2019-10-12 DIAGNOSIS — I248 Other forms of acute ischemic heart disease: Secondary | ICD-10-CM | POA: Diagnosis present

## 2019-10-12 DIAGNOSIS — I82612 Acute embolism and thrombosis of superficial veins of left upper extremity: Secondary | ICD-10-CM | POA: Diagnosis present

## 2019-10-12 DIAGNOSIS — R0682 Tachypnea, not elsewhere classified: Secondary | ICD-10-CM | POA: Diagnosis not present

## 2019-10-12 DIAGNOSIS — Z8546 Personal history of malignant neoplasm of prostate: Secondary | ICD-10-CM

## 2019-10-12 DIAGNOSIS — J9690 Respiratory failure, unspecified, unspecified whether with hypoxia or hypercapnia: Secondary | ICD-10-CM

## 2019-10-12 DIAGNOSIS — R778 Other specified abnormalities of plasma proteins: Secondary | ICD-10-CM | POA: Diagnosis not present

## 2019-10-12 DIAGNOSIS — D638 Anemia in other chronic diseases classified elsewhere: Secondary | ICD-10-CM | POA: Diagnosis not present

## 2019-10-12 DIAGNOSIS — Z79899 Other long term (current) drug therapy: Secondary | ICD-10-CM

## 2019-10-12 DIAGNOSIS — R52 Pain, unspecified: Secondary | ICD-10-CM | POA: Diagnosis not present

## 2019-10-12 DIAGNOSIS — Z4659 Encounter for fitting and adjustment of other gastrointestinal appliance and device: Secondary | ICD-10-CM

## 2019-10-12 DIAGNOSIS — I214 Non-ST elevation (NSTEMI) myocardial infarction: Secondary | ICD-10-CM | POA: Diagnosis not present

## 2019-10-12 DIAGNOSIS — Z7982 Long term (current) use of aspirin: Secondary | ICD-10-CM

## 2019-10-12 DIAGNOSIS — Y838 Other surgical procedures as the cause of abnormal reaction of the patient, or of later complication, without mention of misadventure at the time of the procedure: Secondary | ICD-10-CM | POA: Diagnosis not present

## 2019-10-12 DIAGNOSIS — R0689 Other abnormalities of breathing: Secondary | ICD-10-CM | POA: Diagnosis not present

## 2019-10-12 DIAGNOSIS — Z452 Encounter for adjustment and management of vascular access device: Secondary | ICD-10-CM

## 2019-10-12 DIAGNOSIS — J988 Other specified respiratory disorders: Secondary | ICD-10-CM

## 2019-10-12 DIAGNOSIS — J9601 Acute respiratory failure with hypoxia: Secondary | ICD-10-CM

## 2019-10-12 DIAGNOSIS — J9811 Atelectasis: Secondary | ICD-10-CM | POA: Diagnosis not present

## 2019-10-12 DIAGNOSIS — IMO0002 Reserved for concepts with insufficient information to code with codable children: Secondary | ICD-10-CM | POA: Diagnosis present

## 2019-10-12 DIAGNOSIS — T17800A Unspecified foreign body in other parts of respiratory tract causing asphyxiation, initial encounter: Secondary | ICD-10-CM

## 2019-10-12 DIAGNOSIS — R100 Acute abdomen: Secondary | ICD-10-CM | POA: Diagnosis present

## 2019-10-12 DIAGNOSIS — E860 Dehydration: Secondary | ICD-10-CM | POA: Diagnosis present

## 2019-10-12 DIAGNOSIS — J398 Other specified diseases of upper respiratory tract: Secondary | ICD-10-CM

## 2019-10-12 DIAGNOSIS — R9431 Abnormal electrocardiogram [ECG] [EKG]: Secondary | ICD-10-CM | POA: Diagnosis not present

## 2019-10-12 DIAGNOSIS — J189 Pneumonia, unspecified organism: Secondary | ICD-10-CM

## 2019-10-12 DIAGNOSIS — T8141XA Infection following a procedure, superficial incisional surgical site, initial encounter: Secondary | ICD-10-CM | POA: Diagnosis not present

## 2019-10-12 DIAGNOSIS — Z8249 Family history of ischemic heart disease and other diseases of the circulatory system: Secondary | ICD-10-CM

## 2019-10-12 DIAGNOSIS — R05 Cough: Secondary | ICD-10-CM

## 2019-10-12 DIAGNOSIS — D62 Acute posthemorrhagic anemia: Secondary | ICD-10-CM | POA: Diagnosis not present

## 2019-10-12 DIAGNOSIS — Z7984 Long term (current) use of oral hypoglycemic drugs: Secondary | ICD-10-CM

## 2019-10-12 DIAGNOSIS — Z96652 Presence of left artificial knee joint: Secondary | ICD-10-CM | POA: Diagnosis present

## 2019-10-12 DIAGNOSIS — E876 Hypokalemia: Secondary | ICD-10-CM | POA: Diagnosis not present

## 2019-10-12 DIAGNOSIS — K5900 Constipation, unspecified: Secondary | ICD-10-CM | POA: Diagnosis present

## 2019-10-12 DIAGNOSIS — R609 Edema, unspecified: Secondary | ICD-10-CM | POA: Diagnosis not present

## 2019-10-12 DIAGNOSIS — I82622 Acute embolism and thrombosis of deep veins of left upper extremity: Secondary | ICD-10-CM | POA: Diagnosis not present

## 2019-10-12 DIAGNOSIS — I10 Essential (primary) hypertension: Secondary | ICD-10-CM | POA: Diagnosis present

## 2019-10-12 DIAGNOSIS — E118 Type 2 diabetes mellitus with unspecified complications: Secondary | ICD-10-CM | POA: Diagnosis not present

## 2019-10-12 DIAGNOSIS — K251 Acute gastric ulcer with perforation: Secondary | ICD-10-CM | POA: Diagnosis not present

## 2019-10-12 DIAGNOSIS — R000 Tachycardia, unspecified: Secondary | ICD-10-CM

## 2019-10-12 DIAGNOSIS — J9600 Acute respiratory failure, unspecified whether with hypoxia or hypercapnia: Secondary | ICD-10-CM

## 2019-10-12 DIAGNOSIS — Z923 Personal history of irradiation: Secondary | ICD-10-CM

## 2019-10-12 DIAGNOSIS — Y842 Radiological procedure and radiotherapy as the cause of abnormal reaction of the patient, or of later complication, without mention of misadventure at the time of the procedure: Secondary | ICD-10-CM | POA: Diagnosis present

## 2019-10-12 DIAGNOSIS — R58 Hemorrhage, not elsewhere classified: Secondary | ICD-10-CM | POA: Diagnosis not present

## 2019-10-12 DIAGNOSIS — R1084 Generalized abdominal pain: Secondary | ICD-10-CM | POA: Diagnosis present

## 2019-10-12 DIAGNOSIS — M7989 Other specified soft tissue disorders: Secondary | ICD-10-CM | POA: Diagnosis not present

## 2019-10-12 DIAGNOSIS — E7800 Pure hypercholesterolemia, unspecified: Secondary | ICD-10-CM | POA: Diagnosis not present

## 2019-10-12 DIAGNOSIS — I4729 Other ventricular tachycardia: Secondary | ICD-10-CM

## 2019-10-12 DIAGNOSIS — Z6827 Body mass index (BMI) 27.0-27.9, adult: Secondary | ICD-10-CM

## 2019-10-12 DIAGNOSIS — E1165 Type 2 diabetes mellitus with hyperglycemia: Secondary | ICD-10-CM | POA: Diagnosis present

## 2019-10-12 DIAGNOSIS — K659 Peritonitis, unspecified: Secondary | ICD-10-CM

## 2019-10-12 DIAGNOSIS — K316 Fistula of stomach and duodenum: Secondary | ICD-10-CM | POA: Diagnosis not present

## 2019-10-12 DIAGNOSIS — R059 Cough, unspecified: Secondary | ICD-10-CM

## 2019-10-12 DIAGNOSIS — K589 Irritable bowel syndrome without diarrhea: Secondary | ICD-10-CM | POA: Diagnosis present

## 2019-10-12 HISTORY — PX: LAPAROTOMY: SHX154

## 2019-10-12 LAB — LIPID PANEL
Cholesterol: 114 mg/dL (ref 0–200)
HDL: 39 mg/dL — ABNORMAL LOW (ref >40)
LDL Cholesterol: 62 mg/dL (ref 0–99)
Total CHOL/HDL Ratio: 2.9 RATIO
Triglycerides: 64 mg/dL (ref <150)
VLDL: 13 mg/dL (ref 0–40)

## 2019-10-12 LAB — RAPID URINE DRUG SCREEN, HOSP PERFORMED
Amphetamines: NOT DETECTED
Barbiturates: NOT DETECTED
Benzodiazepines: NOT DETECTED
Cocaine: NOT DETECTED
Opiates: POSITIVE — AB
Tetrahydrocannabinol: NOT DETECTED

## 2019-10-12 LAB — BASIC METABOLIC PANEL
Anion gap: 12 (ref 5–15)
BUN: 34 mg/dL — ABNORMAL HIGH (ref 8–23)
CO2: 20 mmol/L — ABNORMAL LOW (ref 22–32)
Calcium: 7.8 mg/dL — ABNORMAL LOW (ref 8.9–10.3)
Chloride: 102 mmol/L (ref 98–111)
Creatinine, Ser: 1.24 mg/dL (ref 0.61–1.24)
GFR calc Af Amer: >60 mL/min (ref >60)
GFR calc non Af Amer: 53 mL/min — ABNORMAL LOW (ref >60)
Glucose, Bld: 196 mg/dL — ABNORMAL HIGH (ref 70–99)
Potassium: 4.6 mmol/L (ref 3.5–5.1)
Sodium: 134 mmol/L — ABNORMAL LOW (ref 135–145)

## 2019-10-12 LAB — COMPREHENSIVE METABOLIC PANEL
ALT: 22 U/L (ref 0–44)
AST: 32 U/L (ref 15–41)
Albumin: 3.4 g/dL — ABNORMAL LOW (ref 3.5–5.0)
Alkaline Phosphatase: 46 U/L (ref 38–126)
Anion gap: 14 (ref 5–15)
BUN: 25 mg/dL — ABNORMAL HIGH (ref 8–23)
CO2: 23 mmol/L (ref 22–32)
Calcium: 8.1 mg/dL — ABNORMAL LOW (ref 8.9–10.3)
Chloride: 98 mmol/L (ref 98–111)
Creatinine, Ser: 0.98 mg/dL (ref 0.61–1.24)
GFR calc Af Amer: >60 mL/min (ref >60)
GFR calc non Af Amer: >60 mL/min (ref >60)
Glucose, Bld: 186 mg/dL — ABNORMAL HIGH (ref 70–99)
Potassium: 3 mmol/L — ABNORMAL LOW (ref 3.5–5.1)
Sodium: 135 mmol/L (ref 135–145)
Total Bilirubin: 1.6 mg/dL — ABNORMAL HIGH (ref 0.3–1.2)
Total Protein: 6.2 g/dL — ABNORMAL LOW (ref 6.5–8.1)

## 2019-10-12 LAB — CBC
HCT: 43.9 % (ref 39.0–52.0)
Hemoglobin: 14.1 g/dL (ref 13.0–17.0)
MCH: 26.1 pg (ref 26.0–34.0)
MCHC: 32.1 g/dL (ref 30.0–36.0)
MCV: 81.1 fL (ref 80.0–100.0)
Platelets: 258 10*3/uL (ref 150–400)
RBC: 5.41 MIL/uL (ref 4.22–5.81)
RDW: 17.6 % — ABNORMAL HIGH (ref 11.5–15.5)
WBC: 3.6 10*3/uL — ABNORMAL LOW (ref 4.0–10.5)
nRBC: 0 % (ref 0.0–0.2)

## 2019-10-12 LAB — CBC WITH DIFFERENTIAL/PLATELET
Abs Immature Granulocytes: 0.02 10*3/uL (ref 0.00–0.07)
Basophils Absolute: 0 10*3/uL (ref 0.0–0.1)
Basophils Relative: 0 %
Eosinophils Absolute: 0 10*3/uL (ref 0.0–0.5)
Eosinophils Relative: 0 %
HCT: 42 % (ref 39.0–52.0)
Hemoglobin: 14 g/dL (ref 13.0–17.0)
Immature Granulocytes: 0 %
Lymphocytes Relative: 7 %
Lymphs Abs: 0.3 10*3/uL — ABNORMAL LOW (ref 0.7–4.0)
MCH: 26.8 pg (ref 26.0–34.0)
MCHC: 33.3 g/dL (ref 30.0–36.0)
MCV: 80.5 fL (ref 80.0–100.0)
Monocytes Absolute: 0.4 10*3/uL (ref 0.1–1.0)
Monocytes Relative: 8 %
Neutro Abs: 4.2 10*3/uL (ref 1.7–7.7)
Neutrophils Relative %: 85 %
Platelets: 231 10*3/uL (ref 150–400)
RBC: 5.22 MIL/uL (ref 4.22–5.81)
RDW: 17.2 % — ABNORMAL HIGH (ref 11.5–15.5)
WBC: 5 10*3/uL (ref 4.0–10.5)
nRBC: 0 % (ref 0.0–0.2)

## 2019-10-12 LAB — OCCULT BLOOD GASTRIC / DUODENUM (SPECIMEN CUP)
Occult Blood, Gastric: POSITIVE — AB
pH, Gastric: 3

## 2019-10-12 LAB — BLOOD GAS, ARTERIAL
Acid-base deficit: 5.4 mmol/L — ABNORMAL HIGH (ref 0.0–2.0)
Bicarbonate: 17.2 mmol/L — ABNORMAL LOW (ref 20.0–28.0)
Drawn by: 422461
FIO2: 50
MECHVT: 580 mL
O2 Saturation: 97.3 %
PEEP: 5 cmH2O
Patient temperature: 95.1
RATE: 22 resp/min
pCO2 arterial: 26.1 mmHg — ABNORMAL LOW (ref 32.0–48.0)
pH, Arterial: 7.435 (ref 7.350–7.450)
pO2, Arterial: 104 mmHg (ref 83.0–108.0)

## 2019-10-12 LAB — GLUCOSE, CAPILLARY: Glucose-Capillary: 181 mg/dL — ABNORMAL HIGH (ref 70–99)

## 2019-10-12 LAB — PREPARE RBC (CROSSMATCH)

## 2019-10-12 LAB — TSH: TSH: 0.861 u[IU]/mL (ref 0.350–4.500)

## 2019-10-12 LAB — CBG MONITORING, ED: Glucose-Capillary: 214 mg/dL — ABNORMAL HIGH (ref 70–99)

## 2019-10-12 LAB — HEMOGLOBIN A1C
Hgb A1c MFr Bld: 6.6 % — ABNORMAL HIGH (ref 4.8–5.6)
Mean Plasma Glucose: 142.72 mg/dL

## 2019-10-12 LAB — TROPONIN I (HIGH SENSITIVITY): Troponin I (High Sensitivity): 48 ng/L — ABNORMAL HIGH (ref <18)

## 2019-10-12 LAB — LIPASE, BLOOD: Lipase: 44 U/L (ref 11–51)

## 2019-10-12 LAB — MAGNESIUM: Magnesium: 2.6 mg/dL — ABNORMAL HIGH (ref 1.7–2.4)

## 2019-10-12 LAB — MRSA PCR SCREENING: MRSA by PCR: NEGATIVE

## 2019-10-12 LAB — ETHANOL: Alcohol, Ethyl (B): <10 mg/dL (ref <10)

## 2019-10-12 SURGERY — LAPAROTOMY, EXPLORATORY
Anesthesia: General | Site: Abdomen

## 2019-10-12 MED ORDER — NOREPINEPHRINE 4 MG/250ML-% IV SOLN
INTRAVENOUS | Status: AC
  Filled 2019-10-12: qty 250

## 2019-10-12 MED ORDER — SODIUM CHLORIDE 0.9 % IV SOLN
INTRAVENOUS | Status: DC | PRN
Start: 2019-10-12 — End: 2019-10-12

## 2019-10-12 MED ORDER — FENTANYL CITRATE (PF) 100 MCG/2ML IJ SOLN
25.0000 ug | INTRAMUSCULAR | Status: DC | PRN
  Administered 2019-10-12 – 2019-10-14 (×4): 50 ug via INTRAVENOUS
  Administered 2019-10-14 (×3): 100 ug via INTRAVENOUS
  Administered 2019-10-14 (×2): 50 ug via INTRAVENOUS
  Administered 2019-10-15: 100 ug via INTRAVENOUS
  Filled 2019-10-12 (×5): qty 2

## 2019-10-12 MED ORDER — PANTOPRAZOLE SODIUM 40 MG IV SOLR
40.0000 mg | Freq: Two times a day (BID) | INTRAVENOUS | Status: DC
  Administered 2019-10-12 – 2019-11-08 (×55): 40 mg via INTRAVENOUS
  Filled 2019-10-12 (×54): qty 40

## 2019-10-12 MED ORDER — FENTANYL CITRATE (PF) 100 MCG/2ML IJ SOLN
50.0000 ug | Freq: Once | INTRAMUSCULAR | Status: AC
  Administered 2019-10-12: 50 ug via INTRAVENOUS
  Filled 2019-10-12: qty 2

## 2019-10-12 MED ORDER — 0.9 % SODIUM CHLORIDE (POUR BTL) OPTIME
TOPICAL | Status: DC | PRN
  Administered 2019-10-12: 8000 mL

## 2019-10-12 MED ORDER — KETAMINE HCL 10 MG/ML IJ SOLN
INTRAMUSCULAR | Status: AC
  Filled 2019-10-12: qty 1

## 2019-10-12 MED ORDER — NOREPINEPHRINE 4 MG/250ML-% IV SOLN
0.0000 ug/min | INTRAVENOUS | Status: DC
  Administered 2019-10-12: 2 ug/min via INTRAVENOUS

## 2019-10-12 MED ORDER — INSULIN ASPART 100 UNIT/ML ~~LOC~~ SOLN
0.0000 [IU] | SUBCUTANEOUS | Status: DC
  Administered 2019-10-12 – 2019-10-13 (×2): 3 [IU] via SUBCUTANEOUS
  Administered 2019-10-13: 2 [IU] via SUBCUTANEOUS
  Administered 2019-10-13 (×2): 3 [IU] via SUBCUTANEOUS
  Administered 2019-10-13 – 2019-10-14 (×2): 2 [IU] via SUBCUTANEOUS
  Administered 2019-10-14: 3 [IU] via SUBCUTANEOUS
  Administered 2019-10-14: 2 [IU] via SUBCUTANEOUS
  Administered 2019-10-14: 3 [IU] via SUBCUTANEOUS
  Administered 2019-10-14 (×2): 2 [IU] via SUBCUTANEOUS
  Administered 2019-10-14 – 2019-10-15 (×4): 3 [IU] via SUBCUTANEOUS
  Administered 2019-10-15: 5 [IU] via SUBCUTANEOUS
  Administered 2019-10-15: 3 [IU] via SUBCUTANEOUS
  Administered 2019-10-16: 5 [IU] via SUBCUTANEOUS
  Administered 2019-10-16: 8 [IU] via SUBCUTANEOUS
  Administered 2019-10-16: 5 [IU] via SUBCUTANEOUS
  Administered 2019-10-16: 3 [IU] via SUBCUTANEOUS
  Administered 2019-10-16 (×2): 8 [IU] via SUBCUTANEOUS
  Administered 2019-10-16 – 2019-10-17 (×4): 5 [IU] via SUBCUTANEOUS
  Administered 2019-10-17 (×2): 8 [IU] via SUBCUTANEOUS
  Administered 2019-10-18 (×3): 5 [IU] via SUBCUTANEOUS
  Filled 2019-10-12: qty 0.15

## 2019-10-12 MED ORDER — SODIUM CHLORIDE 0.9% IV SOLUTION: Freq: Once | INTRAVENOUS | Status: DC

## 2019-10-12 MED ORDER — ORAL CARE MOUTH RINSE
15.0000 mL | OROMUCOSAL | Status: DC
  Administered 2019-10-12 – 2019-10-15 (×24): 15 mL via OROMUCOSAL

## 2019-10-12 MED ORDER — PROPOFOL 500 MG/50ML IV EMUL
INTRAVENOUS | Status: DC | PRN
  Administered 2019-10-12: 25 ug/kg/min via INTRAVENOUS

## 2019-10-12 MED ORDER — ONDANSETRON HCL 4 MG/2ML IJ SOLN
4.0000 mg | Freq: Once | INTRAMUSCULAR | Status: AC
  Administered 2019-10-12: 4 mg via INTRAVENOUS
  Filled 2019-10-12: qty 2

## 2019-10-12 MED ORDER — ASPIRIN EC 81 MG PO TBEC: 81.0000 mg | DELAYED_RELEASE_TABLET | Freq: Every day | ORAL | Status: DC

## 2019-10-12 MED ORDER — MAGNESIUM CITRATE PO SOLN
1.0000 | Freq: Once | ORAL | Status: AC
  Administered 2019-10-12: 1 via ORAL
  Filled 2019-10-12: qty 296

## 2019-10-12 MED ORDER — ROCURONIUM BROMIDE 10 MG/ML (PF) SYRINGE
PREFILLED_SYRINGE | INTRAVENOUS | Status: AC
  Filled 2019-10-12: qty 10

## 2019-10-12 MED ORDER — PHENYLEPHRINE 40 MCG/ML (10ML) SYRINGE FOR IV PUSH (FOR BLOOD PRESSURE SUPPORT)
PREFILLED_SYRINGE | INTRAVENOUS | Status: AC
  Filled 2019-10-12: qty 10

## 2019-10-12 MED ORDER — PHENYLEPHRINE HCL (PRESSORS) 10 MG/ML IV SOLN
INTRAVENOUS | Status: AC
  Filled 2019-10-12: qty 1

## 2019-10-12 MED ORDER — CHLORHEXIDINE GLUCONATE 0.12% ORAL RINSE (MEDLINE KIT): 15.0000 mL | Freq: Two times a day (BID) | OROMUCOSAL | Status: DC

## 2019-10-12 MED ORDER — PIPERACILLIN-TAZOBACTAM 3.375 G IVPB
3.3750 g | Freq: Three times a day (TID) | INTRAVENOUS | Status: DC
  Administered 2019-10-12 – 2019-10-29 (×51): 3.375 g via INTRAVENOUS
  Filled 2019-10-12 (×53): qty 50

## 2019-10-12 MED ORDER — ORAL CARE MOUTH RINSE: 15.0000 mL | OROMUCOSAL | Status: DC

## 2019-10-12 MED ORDER — PROPOFOL 1000 MG/100ML IV EMUL
0.0000 ug/kg/min | INTRAVENOUS | Status: DC
  Administered 2019-10-12 (×2): 30 ug/kg/min via INTRAVENOUS
  Administered 2019-10-13: 35 ug/kg/min via INTRAVENOUS
  Administered 2019-10-13 – 2019-10-14 (×2): 30 ug/kg/min via INTRAVENOUS
  Filled 2019-10-12 (×7): qty 100

## 2019-10-12 MED ORDER — CHLORHEXIDINE GLUCONATE CLOTH 2 % EX PADS
6.0000 | MEDICATED_PAD | Freq: Every day | CUTANEOUS | Status: DC
  Administered 2019-10-12 – 2019-12-01 (×51): 6 via TOPICAL

## 2019-10-12 MED ORDER — ONDANSETRON HCL 4 MG/2ML IJ SOLN
INTRAMUSCULAR | Status: AC
  Filled 2019-10-12: qty 2

## 2019-10-12 MED ORDER — SORBITOL 70 % SOLN
960.0000 mL | TOPICAL_OIL | Freq: Once | ORAL | Status: AC
  Administered 2019-10-12: 960 mL via RECTAL
  Filled 2019-10-12: qty 473

## 2019-10-12 MED ORDER — ONDANSETRON HCL 4 MG/2ML IJ SOLN
4.0000 mg | Freq: Four times a day (QID) | INTRAMUSCULAR | Status: DC | PRN
  Administered 2019-10-12 – 2019-11-30 (×26): 4 mg via INTRAVENOUS
  Filled 2019-10-12 (×30): qty 2

## 2019-10-12 MED ORDER — ALBUMIN HUMAN 5 % IV SOLN
INTRAVENOUS | Status: DC | PRN
Start: 2019-10-12 — End: 2019-10-12

## 2019-10-12 MED ORDER — ACETAMINOPHEN 650 MG RE SUPP
650.0000 mg | Freq: Four times a day (QID) | RECTAL | Status: DC | PRN
  Administered 2019-10-24 – 2019-10-31 (×5): 650 mg via RECTAL
  Filled 2019-10-12 (×5): qty 1

## 2019-10-12 MED ORDER — SUCCINYLCHOLINE CHLORIDE 200 MG/10ML IV SOSY
PREFILLED_SYRINGE | INTRAVENOUS | Status: DC | PRN
  Administered 2019-10-12: 120 mg via INTRAVENOUS

## 2019-10-12 MED ORDER — FENTANYL CITRATE (PF) 100 MCG/2ML IJ SOLN
INTRAMUSCULAR | Status: AC
  Filled 2019-10-12: qty 2

## 2019-10-12 MED ORDER — FENTANYL CITRATE (PF) 250 MCG/5ML IJ SOLN
INTRAMUSCULAR | Status: DC | PRN
  Administered 2019-10-12: 100 ug via INTRAVENOUS
  Administered 2019-10-12 (×6): 50 ug via INTRAVENOUS
  Administered 2019-10-12 (×2): 100 ug via INTRAVENOUS

## 2019-10-12 MED ORDER — CEFAZOLIN SODIUM-DEXTROSE 2-4 GM/100ML-% IV SOLN
INTRAVENOUS | Status: AC
  Filled 2019-10-12: qty 100

## 2019-10-12 MED ORDER — SODIUM CHLORIDE 0.9 % IV SOLN: INTRAVENOUS | Status: AC

## 2019-10-12 MED ORDER — LACTATED RINGERS IV SOLN: INTRAVENOUS | Status: DC | PRN

## 2019-10-12 MED ORDER — FENTANYL CITRATE (PF) 250 MCG/5ML IJ SOLN
INTRAMUSCULAR | Status: AC
  Filled 2019-10-12: qty 5

## 2019-10-12 MED ORDER — MIDAZOLAM HCL 2 MG/2ML IJ SOLN
INTRAMUSCULAR | Status: AC
  Filled 2019-10-12: qty 2

## 2019-10-12 MED ORDER — DEXAMETHASONE SODIUM PHOSPHATE 10 MG/ML IJ SOLN
INTRAMUSCULAR | Status: AC
  Filled 2019-10-12: qty 1

## 2019-10-12 MED ORDER — ROCURONIUM BROMIDE 10 MG/ML (PF) SYRINGE
PREFILLED_SYRINGE | INTRAVENOUS | Status: DC | PRN
  Administered 2019-10-12: 100 mg via INTRAVENOUS

## 2019-10-12 MED ORDER — ACETAMINOPHEN 325 MG PO TABS: 650.0000 mg | ORAL_TABLET | Freq: Four times a day (QID) | ORAL | Status: DC | PRN

## 2019-10-12 MED ORDER — MIDAZOLAM HCL 5 MG/5ML IJ SOLN
INTRAMUSCULAR | Status: DC | PRN
  Administered 2019-10-12: 1 mg via INTRAVENOUS

## 2019-10-12 MED ORDER — PHENYLEPHRINE HCL (PRESSORS) 10 MG/ML IV SOLN
INTRAVENOUS | Status: DC | PRN
  Administered 2019-10-12: 200 ug via INTRAVENOUS

## 2019-10-12 MED ORDER — ETOMIDATE 2 MG/ML IV SOLN
INTRAVENOUS | Status: DC | PRN
  Administered 2019-10-12: 12 mg via INTRAVENOUS

## 2019-10-12 MED ORDER — SIMETHICONE 40 MG/0.6ML PO SUSP (UNIT DOSE)
40.0000 mg | Freq: Once | ORAL | Status: AC
  Administered 2019-10-12: 40 mg via ORAL
  Filled 2019-10-12: qty 0.6

## 2019-10-12 MED ORDER — METOPROLOL TARTRATE 5 MG/5ML IV SOLN
5.0000 mg | Freq: Once | INTRAVENOUS | Status: AC
  Administered 2019-10-12: 5 mg via INTRAVENOUS
  Filled 2019-10-12: qty 5

## 2019-10-12 MED ORDER — MIDAZOLAM HCL 2 MG/2ML IJ SOLN
1.0000 mg | INTRAMUSCULAR | Status: DC | PRN
  Administered 2019-10-14 (×2): 1 mg via INTRAVENOUS
  Filled 2019-10-12 (×2): qty 2

## 2019-10-12 MED ORDER — POTASSIUM CHLORIDE 10 MEQ/100ML IV SOLN
10.0000 meq | INTRAVENOUS | Status: AC
  Administered 2019-10-12 (×4): 10 meq via INTRAVENOUS
  Filled 2019-10-12 (×4): qty 100

## 2019-10-12 MED ORDER — CEFAZOLIN SODIUM-DEXTROSE 2-3 GM-%(50ML) IV SOLR
INTRAVENOUS | Status: DC | PRN
Start: 2019-10-12 — End: 2019-10-12
  Administered 2019-10-12: 2 g via INTRAVENOUS

## 2019-10-12 MED ORDER — PHENYLEPHRINE HCL-NACL 10-0.9 MG/250ML-% IV SOLN
INTRAVENOUS | Status: DC | PRN
  Administered 2019-10-12: 50 ug/min via INTRAVENOUS

## 2019-10-12 MED ORDER — SUCCINYLCHOLINE CHLORIDE 200 MG/10ML IV SOSY
PREFILLED_SYRINGE | INTRAVENOUS | Status: AC
  Filled 2019-10-12: qty 10

## 2019-10-12 MED ORDER — ENOXAPARIN SODIUM 40 MG/0.4ML ~~LOC~~ SOLN: 40.0000 mg | SUBCUTANEOUS | Status: DC

## 2019-10-12 MED ORDER — CHLORHEXIDINE GLUCONATE 0.12% ORAL RINSE (MEDLINE KIT)
15.0000 mL | Freq: Two times a day (BID) | OROMUCOSAL | Status: DC
  Administered 2019-10-12 – 2019-10-13 (×3): 15 mL via OROMUCOSAL

## 2019-10-12 SURGICAL SUPPLY — 25 items
CHLORAPREP W/TINT 26 (MISCELLANEOUS) ×3 IMPLANT
COVER WAND RF STERILE (DRAPES) IMPLANT
DRAIN CHANNEL 19F RND (DRAIN) ×6 IMPLANT
DRAPE LAPAROSCOPIC ABDOMINAL (DRAPES) ×3 IMPLANT
DRSG PAD ABDOMINAL 8X10 ST (GAUZE/BANDAGES/DRESSINGS) ×3 IMPLANT
ELECT REM PT RETURN 15FT ADLT (MISCELLANEOUS) ×3 IMPLANT
EVACUATOR SILICONE 100CC (DRAIN) ×3 IMPLANT
GAUZE SPONGE 4X4 12PLY STRL (GAUZE/BANDAGES/DRESSINGS) ×3 IMPLANT
GLOVE BIOGEL PI IND STRL 7.0 (GLOVE) ×1 IMPLANT
GLOVE BIOGEL PI INDICATOR 7.0 (GLOVE) ×2
GLOVE SURG SIGNA 7.5 PF LTX (GLOVE) ×3 IMPLANT
GOWN STRL REUS W/TWL LRG LVL3 (GOWN DISPOSABLE) ×3 IMPLANT
GOWN STRL REUS W/TWL XL LVL3 (GOWN DISPOSABLE) ×6 IMPLANT
KIT BASIN (CUSTOM PROCEDURE TRAY) ×3 IMPLANT
KIT TURNOVER KIT A (KITS) IMPLANT
NS IRRIG 1000ML POUR BTL (IV SOLUTION) ×3 IMPLANT
PACK GENERAL/GYN (CUSTOM PROCEDURE TRAY) ×3 IMPLANT
PENCIL SMOKE EVACUATOR (MISCELLANEOUS) IMPLANT
SHEARS HARMONIC ACE PLUS 36CM (ENDOMECHANICALS) IMPLANT
STAPLER VISISTAT 35W (STAPLE) ×3 IMPLANT
SUT ETHILON 2 0 PS N (SUTURE) ×3 IMPLANT
SUT PDS AB 1 TP1 96 (SUTURE) ×6 IMPLANT
SUT SILK 2 0SH CR/8 30 (SUTURE) ×3 IMPLANT
TOWEL OR 17X26 10 PK STRL BLUE (TOWEL DISPOSABLE) ×3 IMPLANT
TRAY FOLEY MTR SLVR 16FR STAT (SET/KITS/TRAYS/PACK) ×3 IMPLANT

## 2019-10-12 NOTE — ED Provider Notes (Signed)
[facility] DEPT Provider Note: Georgena Spurling, MD, FACEP  CSN: [id number] MRN: [id number] ARRIVAL: 10/12/19 at [place]: Accident  Abdominal Pain and Vomiting   HISTORY OF PRESENT ILLNESS  10/12/19 4:24 AM Matthew Schwartz is a 86 y.o. male who was seen in the ED yesterday morning for abdominal pain.  The abdominal pain has been present for 4 to 5 days and was described as a generalized cramping and sharp sensation.  Nothing made it better or worse.  He had associated nausea and one episode of vomiting.  He had been seen in the ED 2 days earlier for similar complaints.  CT of the abdomen and pelvis on 10/09/2019 showed no acute intra-abdominal process and a chronic appearing renal mass.  CT angiogram of the abdomen and pelvis performed yesterday showed no evidence of bowel ischemia.  He was treated with IV fluids, Zofran, morphine and Dilaudid and discharged home yesterday evening after demonstrating ability to drink without vomiting.  The hospitalist was consulted but decided the patient did not need admission at that time and he was discharged home about 5:41 PM.  The patient returns with complaints of continued abdominal pain, nausea and vomiting.  He reports vomiting about 10 times since discharge.  He rates his abdominal pain is an 8 out of 10, aching in nature with also cramping.  He feels that his abdomen is distended and feels like he needs to have a bowel movement, even requesting an enema, even though his CT scan shows minimal stool in the descending and sigmoid colon.  He has no history of cardiac rhythm problems problems (he did have angioplasty in 2000) and sees his cardiologist annually.  His EKG yesterday showed ventricular bigeminy and his rhythm strip shows frequent ectopy and frequent runs of tachycardia without obvious P waves; these runs have some irregularity of rhythm suggestive of paroxysmal atrial fibrillation and the QRS complexes are approximately the same  with as his atrially paced complexes.  Past Medical History:  Diagnosis Date  . Diabetes mellitus without complication ([place])   . Dyslipidemia   . GERD (gastroesophageal reflux disease)   . Hypertension   . Prostate cancer ([place]) 12/23/13   Adenocarcinoma  . PSA elevation   . S/P radiation therapy 05/18/2014 through 07/16/2014    Prostate 7800 cGy in 40 sessions, seminal vesicles 5600 cGy in 40 sessions     Past Surgical History:  Procedure Laterality Date  . ANGIOPLASTY  2000  . cataract surgery  2000  . COLONOSCOPY    . PROSTATE BIOPSY  12/23/13  . REPLACEMENT TOTAL KNEE  2008/2009   bilateral    Family History  Problem Relation Age of Onset  . CVA Mother   . Heart disease Sister   . Coronary artery disease Sister   . Heart disease Brother   . Coronary artery disease Brother   . Heart disease Brother   . Coronary artery disease Brother     Social History   Tobacco Use  . Smoking status: Former Smoker    Quit date: 06/14/1994    Years since quitting: 25.3  . Smokeless tobacco: Never Used  Vaping Use  . Vaping Use: Never used  Substance Use Topics  . Alcohol use: No  . Drug use: No    Prior to Admission medications   Medication Sig Start Date End Date Taking? Authorizing Provider  alfuzosin (UROXATRAL) 10 MG 24 hr tablet Take 10 mg by mouth daily. 09/07/19   [provider]  amLODipine (NORVASC) 5 MG tablet Take 5 mg by mouth daily. 07/13/19   [provider]  aspirin 81 MG tablet Take 81 mg by mouth every morning.     [provider]  atorvastatin (LIPITOR) 40 MG tablet Take 40 mg by mouth at bedtime.     [provider]  dicyclomine (BENTYL) 20 MG tablet Take 1 tablet (20 mg total) by mouth 2 (two) times daily. 10/11/19   Nuala Alpha A, PA-C  famotidine (PEPCID) 20 MG tablet Take 20 mg by mouth 2 (two) times daily.    [provider]    hydrocortisone valerate cream (WESTCORT) 0.2 % Apply 1 application topically 2 (two) times daily.  09/20/19   [provider]  isosorbide mononitrate (IMDUR) 60 MG 24 hr tablet Take 60 mg by mouth every evening.    [provider]  lactulose (CHRONULAC) 10 GM/15ML solution Take 10 g by mouth as needed for mild constipation.    [provider]  metFORMIN (GLUCOPHAGE) 500 MG tablet Take 500 mg by mouth 2 (two) times daily. 09/20/19   [provider]  metoprolol succinate (TOPROL-XL) 50 MG 24 hr tablet Take 50 mg by mouth 2 (two) times daily. 04/07/14   [provider]  potassium chloride (KLOR-CON) 10 MEQ tablet Take 2 tablets (20 mEq total) by mouth daily for 3 days. 10/11/19 10/14/19  Nuala Alpha A, PA-C  PROCTOSOL HC 2.5 % rectal cream Place 1 application rectally daily as needed for hemorrhoids or itching.  11/06/13   [provider]  promethazine (PHENERGAN) 12.5 MG tablet Take 1 tablet (12.5 mg total) by mouth every 8 (eight) hours as needed for up to 3 days for nausea or vomiting. 10/11/19 10/14/19  Nuala Alpha A, PA-C  ramipril (ALTACE) 10 MG capsule Take 10 mg by mouth 2 (two) times daily.     [provider]  RESTASIS 0.05 % ophthalmic emulsion Place 1 drop into both eyes 2 (two) times daily.  09/09/19   [provider]  senna-docusate (SENOKOT-S) 8.6-50 MG tablet Take 1 tablet by mouth daily. 10/09/19   Lucrezia Starch, MD  [facility] injection Inject as directed once. 04/10/17   [provider]    Allergies Losartan potassium   REVIEW OF SYSTEMS  Negative except as noted here or in the History of Present Illness.   PHYSICAL EXAMINATION  Initial Vital Signs Blood pressure 132/72, pulse 87, temperature (!) 97 F (36.1 C), resp. rate 17, SpO2 100 %.  Examination General: Well-developed, well-nourished male in no acute distress; appearance consistent with age of record HENT: normocephalic;  atraumatic Eyes: pupils equal, round and miotic; extraocular muscles intact; bilateral pseudophakia Neck: supple Heart: Regular rhythm with frequent ectopy and frequent runs of tachycardia Lungs: clear to auscultation bilaterally Abdomen: soft; distended; gastric tenderness; bowel sounds present Extremities: No deformity; full range of motion; pulses normal Neurologic: Awake, alert and oriented; motor function intact in all extremities and symmetric; no facial droop Skin: Warm and dry Psychiatric: Normal mood and affect   RESULTS  Summary of this visit's results, reviewed and interpreted by myself:   EKG Interpretation  Date/Time:  Monday October 12 2019 01:11:16 EDT Ventricular Rate:  70 PR Interval:    QRS Duration: 120 QT Interval:  495 QTC Calculation: 535 R Axis:   -133 Text Interpretation: Ectopic atrial rhythm Ventricular bigeminy Consider left ventricular hypertrophy Abnrm T, probable ischemia, anterolateral lds Prolonged QT interval No significant change was found Confirmed by  Keslyn Teater, Jenny Reichmann [phone]) on 10/12/2019 1:42:38 AM      Laboratory Studies: Results for orders placed or performed during the hospital encounter of 10/12/19 (from the past 24 hour(s))  Lipase, blood     Status: None   Collection Time: 10/12/19  3:20 AM  Result Value Ref Range   Lipase 44 11 - 51 U/L  Comprehensive metabolic panel     Status: Abnormal   Collection Time: 10/12/19  3:20 AM  Result Value Ref Range   Sodium 135 135 - 145 mmol/L   Potassium 3.0 (L) 3.5 - 5.1 mmol/L   Chloride 98 98 - 111 mmol/L   CO2 23 22 - 32 mmol/L   Glucose, Bld 186 (H) 70 - 99 mg/dL   BUN 25 (H) 8 - 23 mg/dL   Creatinine, Ser 0.98 0.61 - 1.24 mg/dL   Calcium 8.1 (L) 8.9 - 10.3 mg/dL   Total Protein 6.2 (L) 6.5 - 8.1 g/dL   Albumin 3.4 (L) 3.5 - 5.0 g/dL   AST 32 15 - 41 U/L   ALT 22 0 - 44 U/L   Alkaline Phosphatase 46 38 - 126 U/L   Total Bilirubin 1.6 (H) 0.3 - 1.2 mg/dL   GFR calc non Af Amer >60 >60 mL/min    GFR calc Af Amer >60 >60 mL/min   Anion gap 14 5 - 15  CBC with Differential/Platelet     Status: Abnormal   Collection Time: 10/12/19  4:46 AM  Result Value Ref Range   WBC 5.0 4.0 - 10.5 K/uL   RBC 5.22 4.22 - 5.81 MIL/uL   Hemoglobin 14.0 13.0 - 17.0 g/dL   HCT 42.0 39 - 52 %   MCV 80.5 80.0 - 100.0 fL   MCH 26.8 26.0 - 34.0 pg   MCHC 33.3 30.0 - 36.0 g/dL   RDW 17.2 (H) 11.5 - 15.5 %   Platelets 231 150 - 400 K/uL   nRBC 0.0 0.0 - 0.2 %   Neutrophils Relative % 85 %   Neutro Abs 4.2 1.7 - 7.7 K/uL   Lymphocytes Relative 7 %   Lymphs Abs 0.3 (L) 0.7 - 4.0 K/uL   Monocytes Relative 8 %   Monocytes Absolute 0.4 0 - 1 K/uL   Eosinophils Relative 0 %   Eosinophils Absolute 0.0 0 - 0 K/uL   Basophils Relative 0 %   Basophils Absolute 0.0 0 - 0 K/uL   WBC Morphology VACUOLATED NEUTROPHILS    Immature Granulocytes 0 %   Abs Immature Granulocytes 0.02 0.00 - 0.07 K/uL   Target Cells PRESENT   Troponin I (High Sensitivity)     Status: Abnormal   Collection Time: 10/12/19  4:46 AM  Result Value Ref Range   Troponin I (High Sensitivity) 48 (H) <18 ng/L   Imaging Studies: CT Angio Abd/Pel W and/or Wo Contrast  Result Date: 10/11/2019 CLINICAL DATA:  Persistent abdominal pain. Clinical concern for ischemia. EXAM: CTA ABDOMEN AND PELVIS WITHOUT AND WITH CONTRAST TECHNIQUE: Multidetector CT imaging of the abdomen and pelvis was performed using the standard protocol during bolus administration of intravenous contrast. Multiplanar reconstructed images and MIPs were obtained and reviewed to evaluate the vascular anatomy. CONTRAST:  158mL OMNIPAQUE IOHEXOL 350 MG/ML SOLN COMPARISON:  Contrast-enhanced CT 2 days ago. FINDINGS: Place not patient motion artifact limits assessment. VASCULAR Aorta: Atherosclerosis and tortuosity, mild-to-moderate. No aneurysm, dissection, or significant stenosis. No evidence of vasculitis. Celiac: Mild plaque at the origin but no significant stenosis. No  dissection or acute findings. Branch vessels are patent, partially obscured by motion. SMA: Mild plaque in the SMA without significant stenosis. No dissection or vasculitis. Replaced right hepatic artery arising from the SMA. Mesenteric branch vessels are patent, allowing for motion limitations. Renals: Minor plaque at the origins without significant stenosis. No dissection or vasculitis. Single bilateral renal arteries. IMA: Patent without significant stenosis. Inflow: Mild atherosclerosis. No aneurysm, significant stenosis, dissection or vasculitis. Proximal Outflow: Bilateral common femoral and visualized portions of the superficial and profunda femoral arteries are patent without evidence of aneurysm, dissection, vasculitis or significant stenosis. Veins: Venous phase imaging demonstrates patency of the portal and superior mesenteric veins. No evidence of mesenteric venous thrombosis. IVC is unremarkable. Review of the MIP images confirms the above findings. NON-VASCULAR Lower chest: No acute findings. Tiny subpleural nodule in the right lower lobe is unchanged. Calcified granuloma at the lung bases. Hepatobiliary: No focal hepatic lesion. Minimal focal fatty infiltration adjacent to the falciform ligament. Physiologically distended gallbladder without calcified gallstone. Stable mild biliary dilatation with common bile duct measuring 12 mm, unchanged. Pancreas: Stable proximal pancreatic ductal prominence 5 mm. No peripancreatic inflammation. No evidence of pancreatic mass. Spleen: Normal in size without focal abnormality. Adrenals/Urinary Tract: No adrenal nodule. No hydronephrosis. Multiple bilateral low-density lesions in both kidneys, incompletely characterized. Possibly enhancing right upper pole 12 mm lesion, series 11, image 57, unchanged over the past 2 days. Urinary bladder is unremarkable. Stomach/Bowel: Stomach partially distended. Equivocal pre pyloric gastric wall thickening versus peristalsis. No  small bowel obstruction or inflammatory change. Scattered intraluminal fluid within pelvic small bowel, nonspecific. Normal appendix. Moderate volume of formed stool in the ascending and transverse colon. Small volume of formed stool in the descending colon. Scattered descending and sigmoid colonic diverticulosis without diverticulitis. Sigmoid colon is redundant. There is minimal liquid stool in the distal sigmoid colon and rectum. No colonic wall thickening or pericolonic edema, particularly at the splenic flexure. No findings to suggest bowel ischemia. No pneumatosis. Lymphatic: No adenopathy. Reproductive: No acute findings.  Stable markers in the prostate. Other: No free air, free fluid, or intra-abdominal fluid collection. Musculoskeletal: There are no acute or suspicious osseous abnormalities. IMPRESSION: VASCULAR. 1. Mild aortic and branch atherosclerosis. No acute vascular findings. 2. No significant arterial stenosis of the mesenteric vasculature. Mesenteric vessels are widely patent. NON-VASCULAR. 1. No findings to suggest bowel ischemia. 2. Colonic diverticulosis without diverticulitis. Moderate stool in the proximal colon. 3. Equivocal pre pyloric gastric wall thickening versus peristalsis. 4. Bilateral renal cysts. Possibly enhancing 12 mm lesion upper pole right kidney is unchanged, possibly located renal neoplasm. 5. Stable biliary dilatation. Aortic Atherosclerosis (ICD10-I70.0). Electronically Signed   By: Keith Rake M.D.   On: 10/11/2019 15:55    ED COURSE and MDM  Nursing notes, initial and subsequent vitals signs, including pulse oximetry, reviewed and interpreted by myself.  Vitals:   10/12/19 0112 10/12/19 0321 10/12/19 0400 10/12/19 0420  BP: 139/71 132/72 133/64 (!) 130/49  Pulse: 74 87 99 (!) 173  Resp: 16 17 18  (!) 27  Temp: (!) 97 F (36.1 C)     SpO2: 100% 100% 96% 94%   Medications  potassium chloride 10 mEq in 100 mL IVPB (10 mEq Intravenous New Bag/Given 10/12/19  0519)  ondansetron (ZOFRAN) injection 4 mg (4 mg Intravenous Given 10/12/19 0500)  fentaNYL (SUBLIMAZE) injection 50 mcg (50 mcg Intravenous Given 10/12/19 0459)  metoprolol tartrate (LOPRESSOR) injection 5 mg (5 mg Intravenous Given 10/12/19 0516)   4:49 AM Discussed  with the hospitalist Dr. Cyd Silence.  He advises we check a troponin and try beta-blocker to help regulate his tachycardia.  We plan to have him admitted to the hospitalist service after troponin results.  6:13 AM Troponin elevated but not severely.  This may represent elevation due to the patient's arrhythmia.  We will have him admitted to the hospitalist service and this was discussed with Dr. Cyd Silence.  He states the day team will see him.  PROCEDURES  Procedures  CRITICAL CARE Performed by: Karen Chafe Voncile Schwarz Total critical care time: 30 minutes Critical care time was exclusive of separately billable procedures and treating other patients. Critical care was necessary to treat or prevent imminent or life-threatening deterioration. Critical care was time spent personally by me on the following activities: development of treatment plan with patient and/or surrogate as well as nursing, discussions with consultants, evaluation of patient's response to treatment, examination of patient, obtaining history from patient or surrogate, ordering and performing treatments and interventions, ordering and review of laboratory studies, ordering and review of radiographic studies, pulse oximetry and re-evaluation of patient's condition.   ED DIAGNOSES     ICD-10-CM   1. Epigastric pain  R10.13   2. PVCs (premature ventricular contractions)  I49.3   3. Tachyarrhythmia  R00.0   4. Hypokalemia  E87.6   5. Nausea and vomiting in adult  R11.2   6. Elevated troponin level  R77.8        Takoda Siedlecki, Jenny Reichmann, MD 10/12/19 [phone]

## 2019-10-12 NOTE — Anesthesia Procedure Notes (Signed)
Central Venous Catheter Insertion Performed by: Duane Boston, MD, anesthesiologist Start/End6/14/2021 5:04 PM, 10/12/2019 5:14 PM Patient location: Pre-op. Preanesthetic checklist: patient identified, IV checked, site marked, risks and benefits discussed, surgical consent, monitors and equipment checked, pre-op evaluation, timeout performed and anesthesia consent Position: Trendelenburg Lidocaine 1% used for infiltration and patient sedated Hand hygiene performed , maximum sterile barriers used  and Seldinger technique used Catheter size: 8 Fr Total catheter length 16. Central line was placed.Double lumen Procedure performed using ultrasound guided technique. Ultrasound Notes:image(s) printed for medical record Attempts: 1 Following insertion, dressing applied, line sutured and Biopatch. Post procedure assessment: blood return through all ports, free fluid flow and no air  Patient tolerated the procedure well with no immediate complications.

## 2019-10-12 NOTE — ED Notes (Signed)
Daughter called this Probation officer into room related to father/pt vomiting. Daughter concerned that it is blood. Hospitalist paged and pt instructed to hold off on PO intake until hospitalist returns page to discuss further plan of care. Primary RN made aware.

## 2019-10-12 NOTE — Anesthesia Procedure Notes (Addendum)
Arterial Line Insertion Start/End6/14/2021 5:25 PM Performed by: Cynda Familia, CRNA, CRNA  Patient location: OR. Emergency situation radial was placed Catheter size: 20 G Hand hygiene performed   Procedure performed without using ultrasound guided technique. Ultrasound Notes:anatomy identified Following insertion, dressing applied and Biopatch. Post procedure assessment: decreased circulation  Patient tolerated the procedure well with no immediate complications.

## 2019-10-12 NOTE — Progress Notes (Signed)
Arterial Blood Gas drawn from A-line and sent to be ran by Lab.  ABG drawn on ventilator settings of Vt=580, RR-16, PEEP=+5.0, FiO2=50%.

## 2019-10-12 NOTE — Op Note (Signed)
EXPLORATORY LAPAROTOMY omental patch repair perforated duodenal ulcer  Procedure Note  Randon Somera 10/12/2019   Pre-op Diagnosis: acute abdomen     Post-op Diagnosis: perforated duodenal ulcer  Procedure(s): EXPLORATORY LAPAROTOMY  OMENTAL PATCH REPAIR OF DUODENAL ULCER  Surgeon(s): Coralie Keens, MD  Anesthesia: General  Staff:  Circulator: Swygert, Otho Ket, RN; Gillis Santa, RN Scrub Person: Toney Rakes  Estimated Blood Loss: Minimal               Indications: This is an 86 year old gentleman who presents with abdominal pain.  He had a CT scan on 6/11 which was unremarkable except for mild constipation.  He had a CT angiogram of the abdomen pelvis yesterday looking for ischemia which was also normal.  He developed acute abdominal pain this afternoon so surgery was urgently consulted.  He was found to be hypotensive and mottled with diffuse peritonitis.  The decision was made to proceed emergently to the operating room.  Findings: The patient had a perforated duodenal ulcer with at least a liter of intra-abdominal gastric contents in the abdominal cavity.  Procedure: The patient was brought to the operating room identifies correct patient.  He is placed upon the operating table and general anesthesia was induced.  His abdomen was prepped and draped in usual sterile fashion.  I created an upper midline incision with a scalpel.  I then dissected down through the subcutaneous tissue with electrocautery.  The fascia was then open with the cautery.  I then opened the peritoneum.  Upon entering the abdomen the patient had a large dark intra-abdominal fluid consistent with a perforation.  Identified the perforation in the duodenum anteriorly.  The gallbladder was secondarily inflamed secondary to the perforation.  I did not feel a mass in the duodenum or the stomach.  I eviscerated small bowel ran from the ligament of Treitz to the terminal ileum and saw no  other abnormalities.  The transverse colon was dilated but there were no other abnormalities.  The perforation was almost a centimeter in size.  I was able to close the perforation with several interrupted 2-0 silk sutures.  I then left the ties long and pulled a piece of omentum over the repair site and then tied the sutures down creating a patch repair over the area of perforation.  We then irrigated the abdomen with 5 L normal saline.  Hemostasis appeared to be achieved.  I made a separate skin incision and placed a 19 [place] Blake drain through this under direct vision and placed a drain over the area of the perforation and repair.  This was sutured in place with a nylon suture.  I then closed the patient's midline fascia with a running #1 looped PDS suture.  The skin was left open and packed with wet-to-dry saline soaked Kerlix.  Dry gauze was placed over this.  The patient tolerated the procedure.  All counts were correct at the end of the procedure.  He was then left intubated and taken in a guarded condition from the operating room to the intensive care unit.          Coralie Keens   Date: 10/12/2019  Time: 5:56 PM

## 2019-10-12 NOTE — Progress Notes (Signed)
We were called to consult on this patient for n/v and abdominal distension- question ileus vs obstruction. Patient sees Matthew Schwartz and they will consult. They have been called and are aware.  [name], PA-C [facility]

## 2019-10-12 NOTE — Progress Notes (Addendum)
Pharmacy Antibiotic Note  Thaddaeus Granja is a 86 y.o. male admitted on 10/12/2019 with IAI.  Pharmacy has been consulted for Zosyn dosing.  Plan: Zosyn 3.375g IV q8h (4 hour infusion).  Will sign off     Temp (24hrs), Avg:97.4 F (36.3 C), Min:97 F (36.1 C), Max:97.5 F (36.4 C)  Recent Labs  Lab 10/09/19 1054 10/09/19 1234 10/11/19 1209 10/11/19 1355 10/12/19 0320 10/12/19 0446 10/12/19 1430  WBC 7.9  --  10.2  --   --  5.0 3.6*  CREATININE 0.93 0.71 0.69  --  0.98  --  1.24  LATICACIDVEN  --   --   --  1.7  --   --   --     Estimated Creatinine Clearance: 50.2 mL/min (by C-G formula based on SCr of 1.24 mg/dL).    Allergies  Allergen Reactions  . Losartan Potassium Other (See Comments)    Unknown reaction that happened 15 years ago      Thank you for allowing pharmacy to be a part of this patient's care.  Kara Mead 10/12/2019 7:10 PM

## 2019-10-12 NOTE — Consult Note (Signed)
Reason for Consult:acute abdominal pain Referring Physician: Dr. Dia Crawford  Graylon Amory is an 86 y.o. male.  HPI: This is an 86 year old gentleman I was asked to see for acute abdominal pain.  He was admitted yesterday with nausea, vomiting, abdominal pain, constipation.  At admission, his white blood count and lactic acid were normal.  He had an unremarkable CAT scan of the abdomen and pelvis on 6-11.  He had a CT angiogram of his abdomen pelvis yesterday which was unremarkable as well for ischemia.  He now has diffuse 10 out of 10 abdominal pain and hypotension.  He has been having multiple episodes of emesis  Past Medical History:  Diagnosis Date  . Diabetes mellitus without complication ([place])   . Dyslipidemia   . GERD (gastroesophageal reflux disease)   . Hypertension   . Prostate cancer ([place]) 12/23/13   Adenocarcinoma  . PSA elevation   . S/P radiation therapy 05/18/2014 through 07/16/2014    Prostate 7800 cGy in 40 sessions, seminal vesicles 5600 cGy in 40 sessions     Past Surgical History:  Procedure Laterality Date  . ANGIOPLASTY  2000  . cataract surgery  2000  . COLONOSCOPY    . PROSTATE BIOPSY  12/23/13  . REPLACEMENT TOTAL KNEE  2008/2009   bilateral    Family History  Problem Relation Age of Onset  . CVA Mother   . Heart disease Sister   . Coronary artery disease Sister   . Heart disease Brother   . Coronary artery disease Brother   . Heart disease Brother   . Coronary artery disease Brother     Social History:  reports that he quit smoking about 25 years ago. He has never used smokeless tobacco. He reports that he does not drink alcohol and does not use drugs.  Allergies:  Allergies  Allergen Reactions  . Losartan Potassium Other (See Comments)    Unknown reaction that happened 15 years ago    Medications: I have reviewed the patient's current medications.  Results for orders  placed or performed during the hospital encounter of 10/12/19 (from the past 48 hour(s))  Lipase, blood     Status: None   Collection Time: 10/12/19  3:20 AM  Result Value Ref Range   Lipase 44 11 - 51 U/L    Comment: Performed at [facility], [place] [address] [place], [place] [address]  Comprehensive metabolic panel     Status: Abnormal   Collection Time: 10/12/19  3:20 AM  Result Value Ref Range   Sodium 135 135 - 145 mmol/L   Potassium 3.0 (L) 3.5 - 5.1 mmol/L   Chloride 98 98 - 111 mmol/L   CO2 23 22 - 32 mmol/L   Glucose, Bld 186 (H) 70 - 99 mg/dL    Comment: Glucose reference range applies only to samples taken after fasting for at least 8 hours.   BUN 25 (H) 8 - 23 mg/dL   Creatinine, Ser 0.98 0.61 - 1.24 mg/dL   Calcium 8.1 (L) 8.9 - 10.3 mg/dL   Total Protein 6.2 (L) 6.5 - 8.1 g/dL   Albumin 3.4 (L) 3.5 - 5.0 g/dL   AST 32 15 - 41 U/L   ALT 22 0 - 44 U/L   Alkaline Phosphatase 46 38 - 126 U/L   Total Bilirubin 1.6 (H) 0.3 - 1.2 mg/dL   GFR calc non Af Amer >60 >60 mL/min   GFR calc Af Amer >60 >60 mL/min   Anion  gap 14 5 - 15    Comment: Performed at [facility], [place] [address] [place], [place] [address]  CBC with Differential/Platelet     Status: Abnormal   Collection Time: 10/12/19  4:46 AM  Result Value Ref Range   WBC 5.0 4.0 - 10.5 K/uL   RBC 5.22 4.22 - 5.81 MIL/uL   Hemoglobin 14.0 13.0 - 17.0 g/dL   HCT 42.0 39 - 52 %   MCV 80.5 80.0 - 100.0 fL   MCH 26.8 26.0 - 34.0 pg   MCHC 33.3 30.0 - 36.0 g/dL   RDW 17.2 (H) 11.5 - 15.5 %   Platelets 231 150 - 400 K/uL   nRBC 0.0 0.0 - 0.2 %   Neutrophils Relative % 85 %   Neutro Abs 4.2 1.7 - 7.7 K/uL   Lymphocytes Relative 7 %   Lymphs Abs 0.3 (L) 0.7 - 4.0 K/uL   Monocytes Relative 8 %   Monocytes Absolute 0.4 0 - 1 K/uL   Eosinophils Relative 0 %   Eosinophils Absolute 0.0 0 - 0 K/uL   Basophils Relative 0 %   Basophils Absolute 0.0 0 - 0 K/uL   WBC Morphology  VACUOLATED NEUTROPHILS    Immature Granulocytes 0 %   Abs Immature Granulocytes 0.02 0.00 - 0.07 K/uL   Target Cells PRESENT     Comment: Performed at [facility], [place] [address], [place], [place] [address]  Troponin I (High Sensitivity)     Status: Abnormal   Collection Time: 10/12/19  4:46 AM  Result Value Ref Range   Troponin I (High Sensitivity) 48 (H) <18 ng/L    Comment: (NOTE) Elevated high sensitivity troponin I (hsTnI) values and significant  changes across serial measurements may suggest ACS but many other  chronic and acute conditions are known to elevate hsTnI results.  Refer to the "Links" section for chest pain algorithms and additional  guidance. Performed at [facility], [place] [address] [place], [place] [address]   Urine rapid drug screen (hosp performed)     Status: Abnormal   Collection Time: 10/12/19 12:56 PM  Result Value Ref Range   Opiates POSITIVE (A) NONE DETECTED   Cocaine NONE DETECTED NONE DETECTED   Benzodiazepines NONE DETECTED NONE DETECTED   Amphetamines NONE DETECTED NONE DETECTED   Tetrahydrocannabinol NONE DETECTED NONE DETECTED   Barbiturates NONE DETECTED NONE DETECTED    Comment: (NOTE) DRUG SCREEN FOR MEDICAL PURPOSES ONLY.  IF CONFIRMATION IS NEEDED FOR ANY PURPOSE, NOTIFY LAB WITHIN 5 DAYS.  LOWEST DETECTABLE LIMITS FOR URINE DRUG SCREEN Drug Class                     Cutoff (ng/mL) Amphetamine and metabolites    1000 Barbiturate and metabolites    200 Benzodiazepine                 175 Tricyclics and metabolites     300 Opiates and metabolites        300 Cocaine and metabolites        300 THC                            50 Performed at [facility], [place] [address] [place], [place] [address]   Occult blood gastric / duodenum     Status: Abnormal   Collection Time: 10/12/19  1:54 PM  Result Value Ref  Range   pH, Gastric 3    Occult Blood, Gastric POSITIVE (A) NEGATIVE     Comment: Performed at [facility], [place] [address] [place], [place] [address]  Basic metabolic panel     Status: Abnormal   Collection Time: 10/12/19  2:30 PM  Result Value Ref Range   Sodium 134 (L) 135 - 145 mmol/L   Potassium 4.6 3.5 - 5.1 mmol/L    Comment: DELTA CHECK NOTED NO VISIBLE HEMOLYSIS REPEATED TO VERIFY    Chloride 102 98 - 111 mmol/L   CO2 20 (L) 22 - 32 mmol/L   Glucose, Bld 196 (H) 70 - 99 mg/dL    Comment: Glucose reference range applies only to samples taken after fasting for at least 8 hours.   BUN 34 (H) 8 - 23 mg/dL   Creatinine, Ser 1.24 0.61 - 1.24 mg/dL   Calcium 7.8 (L) 8.9 - 10.3 mg/dL   GFR calc non Af Amer 53 (L) >60 mL/min   GFR calc Af Amer >60 >60 mL/min   Anion gap 12 5 - 15    Comment: Performed at [facility], [place] [address] [place], [place] [address]  Magnesium     Status: Abnormal   Collection Time: 10/12/19  2:30 PM  Result Value Ref Range   Magnesium 2.6 (H) 1.7 - 2.4 mg/dL    Comment: Performed at [facility], [place] [address] [place], [place] [address]  Ethanol     Status: None   Collection Time: 10/12/19  2:30 PM  Result Value Ref Range   Alcohol, Ethyl (B) <10 <10 mg/dL    Comment: (NOTE) Lowest detectable limit for serum alcohol is 10 mg/dL.  For medical purposes only. Performed at [facility], [place] [address], [place], [place] [address]   CBC     Status: Abnormal   Collection Time: 10/12/19  2:30 PM  Result Value Ref Range   WBC 3.6 (L) 4.0 - 10.5 K/uL   RBC 5.41 4.22 - 5.81 MIL/uL   Hemoglobin 14.1 13.0 - 17.0 g/dL   HCT 43.9 39 - 52 %   MCV 81.1 80.0 - 100.0 fL   MCH 26.1 26.0 - 34.0 pg   MCHC 32.1 30.0 - 36.0 g/dL   RDW 17.6 (H) 11.5 - 15.5 %   Platelets 258 150 - 400 K/uL   nRBC 0.0 0.0 - 0.2 %    Comment: Performed at [facility], [place] [address], [place], [place] [address]  CBG monitoring, ED     Status: Abnormal    Collection Time: 10/12/19  3:46 PM  Result Value Ref Range   Glucose-Capillary 214 (H) 70 - 99 mg/dL    Comment: Glucose reference range applies only to samples taken after fasting for at least 8 hours.    CT Angio Abd/Pel W and/or Wo Contrast  Result Date: 10/11/2019 CLINICAL DATA:  Persistent abdominal pain. Clinical concern for ischemia. EXAM: CTA ABDOMEN AND PELVIS WITHOUT AND WITH CONTRAST TECHNIQUE: Multidetector CT imaging of the abdomen and pelvis was performed using the standard protocol during bolus administration of intravenous contrast. Multiplanar reconstructed images and MIPs were obtained and reviewed to evaluate the vascular anatomy. CONTRAST:  148mL OMNIPAQUE IOHEXOL 350 MG/ML SOLN COMPARISON:  Contrast-enhanced CT 2 days ago. FINDINGS: Place not patient motion artifact limits assessment. VASCULAR Aorta: Atherosclerosis and tortuosity, mild-to-moderate. No aneurysm, dissection, or significant stenosis. No evidence of vasculitis. Celiac: Mild plaque at the origin but  no significant stenosis. No dissection or acute findings. Branch vessels are patent, partially obscured by motion. SMA: Mild plaque in the SMA without significant stenosis. No dissection or vasculitis. Replaced right hepatic artery arising from the SMA. Mesenteric branch vessels are patent, allowing for motion limitations. Renals: Minor plaque at the origins without significant stenosis. No dissection or vasculitis. Single bilateral renal arteries. IMA: Patent without significant stenosis. Inflow: Mild atherosclerosis. No aneurysm, significant stenosis, dissection or vasculitis. Proximal Outflow: Bilateral common femoral and visualized portions of the superficial and profunda femoral arteries are patent without evidence of aneurysm, dissection, vasculitis or significant stenosis. Veins: Venous phase imaging demonstrates patency of the portal and superior mesenteric veins. No evidence of mesenteric venous thrombosis. IVC is  unremarkable. Review of the MIP images confirms the above findings. NON-VASCULAR Lower chest: No acute findings. Tiny subpleural nodule in the right lower lobe is unchanged. Calcified granuloma at the lung bases. Hepatobiliary: No focal hepatic lesion. Minimal focal fatty infiltration adjacent to the falciform ligament. Physiologically distended gallbladder without calcified gallstone. Stable mild biliary dilatation with common bile duct measuring 12 mm, unchanged. Pancreas: Stable proximal pancreatic ductal prominence 5 mm. No peripancreatic inflammation. No evidence of pancreatic mass. Spleen: Normal in size without focal abnormality. Adrenals/Urinary Tract: No adrenal nodule. No hydronephrosis. Multiple bilateral low-density lesions in both kidneys, incompletely characterized. Possibly enhancing right upper pole 12 mm lesion, series 11, image 57, unchanged over the past 2 days. Urinary bladder is unremarkable. Stomach/Bowel: Stomach partially distended. Equivocal pre pyloric gastric wall thickening versus peristalsis. No small bowel obstruction or inflammatory change. Scattered intraluminal fluid within pelvic small bowel, nonspecific. Normal appendix. Moderate volume of formed stool in the ascending and transverse colon. Small volume of formed stool in the descending colon. Scattered descending and sigmoid colonic diverticulosis without diverticulitis. Sigmoid colon is redundant. There is minimal liquid stool in the distal sigmoid colon and rectum. No colonic wall thickening or pericolonic edema, particularly at the splenic flexure. No findings to suggest bowel ischemia. No pneumatosis. Lymphatic: No adenopathy. Reproductive: No acute findings.  Stable markers in the prostate. Other: No free air, free fluid, or intra-abdominal fluid collection. Musculoskeletal: There are no acute or suspicious osseous abnormalities. IMPRESSION: VASCULAR. 1. Mild aortic and branch atherosclerosis. No acute vascular findings. 2.  No significant arterial stenosis of the mesenteric vasculature. Mesenteric vessels are widely patent. NON-VASCULAR. 1. No findings to suggest bowel ischemia. 2. Colonic diverticulosis without diverticulitis. Moderate stool in the proximal colon. 3. Equivocal pre pyloric gastric wall thickening versus peristalsis. 4. Bilateral renal cysts. Possibly enhancing 12 mm lesion upper pole right kidney is unchanged, possibly located renal neoplasm. 5. Stable biliary dilatation. Aortic Atherosclerosis (ICD10-I70.0). Electronically Signed   By: Keith Rake M.D.   On: 10/11/2019 15:55    Review of Systems  Unable to perform ROS: Acuity of condition  Gastrointestinal: Positive for abdominal distention, abdominal pain, nausea and vomiting.   Blood pressure (!) 143/80, pulse (!) 119, temperature (!) 97.5 F (36.4 C), temperature source Oral, resp. rate 17, SpO2 94 %. Physical Exam He appears acutely ill. His abdomen is distended and diffusely tender with guarding and mottling.  There is mottling of his lower extremities.  There are no hernias.  Assessment/Plan: Acute abdomen  I am uncertain of the cause of his peritonitis and abdominal exam.  Clearly, he has an acute abdomen.  I am recommending emergent exploratory laparotomy.  I have discussed this with him and his family.  I discussed the risk of the procedure which  includes but is not limited to bleeding, infection, injury to surrounding structures, the need for bowel resection, the need for ostomy, the need for further procedures, prolonged intubation, and even death.  Again, I am uncertain of the cause of his acute change in his condition and abdominal exam given the recent CT scans.  Coralie Keens 10/12/2019, 4:13 PM

## 2019-10-12 NOTE — Consult Note (Signed)
Referring Provider: Dr. Dia Crawford Primary Care Physician:  [name], MD Primary Gastroenterologist:  Dr. Penelope Coop [facility])  Reason for Consultation:  Abdominal pain, obstipation  HPI: Matthew Schwartz is a 86 y.o. male with history of chronic constipation, GERD, DM type II, prostate cancer (s/p radiation) presenting with severe abdominal pain and obstipation.  Patient states that he started experiencing constipation, nausea, and vomiting 4 days ago.  This has continued to worsen, causing him to present to the ED today.  He also presented to the ED yesterday, as well as 6/11 for constipation abdominal pain (he as given an enema on 6/11).  He states he has not had a bowel movement for at least 2 to 4 days.  He has a history of chronic constipation for which she has taken lactulose and Colace.  He also takes suppositories as needed but states this has not alleviated his current constipation.    He is currently having severe, diffuse abdominal pain.  He also had 2 episodes of hematemesis today and denies prior history of gastrointestinal bleeding  Patient denies other symptoms such as heartburn, dysphagia, early satiety, decreased appetite, diarrhea, melena, and hematochezia.  He denies anticoagulation use but reports taking aspirin twice per week.  Denies NSAID use.  Denies narcotic use.  He had a flexible sigmoidoscopy in 05/2018 for rectal thickening is seen on MRI.  Flexible sigmoidoscopy showed radiation proctitis.  The scope was advanced to the sigmoid colon at the time of the exam.  His last colonoscopy was in 08/2010 and was pertinent only for internal hemorrhoids.  Past Medical History:  Diagnosis Date  . Diabetes mellitus without complication ([place])   . Dyslipidemia   . GERD (gastroesophageal reflux disease)   . Hypertension   . Prostate cancer ([place]) 12/23/13   Adenocarcinoma  . PSA elevation   . S/P radiation therapy 05/18/2014 through  07/16/2014    Prostate 7800 cGy in 40 sessions, seminal vesicles 5600 cGy in 40 sessions     Past Surgical History:  Procedure Laterality Date  . ANGIOPLASTY  2000  . cataract surgery  2000  . COLONOSCOPY    . PROSTATE BIOPSY  12/23/13  . REPLACEMENT TOTAL KNEE  2008/2009   bilateral    Prior to Admission medications   Medication Sig Start Date End Date Taking? Authorizing Provider  alfuzosin (UROXATRAL) 10 MG 24 hr tablet Take 10 mg by mouth daily. 09/07/19  Yes [provider]  amLODipine (NORVASC) 5 MG tablet Take 5 mg by mouth daily. 07/13/19  Yes [provider]  aspirin 81 MG tablet Take 81 mg by mouth every morning.    Yes [provider]  atorvastatin (LIPITOR) 40 MG tablet Take 40 mg by mouth at bedtime.    Yes [provider]  dicyclomine (BENTYL) 20 MG tablet Take 1 tablet (20 mg total) by mouth 2 (two) times daily. 10/11/19  Yes Nuala Alpha A, PA-C  famotidine (PEPCID) 20 MG tablet Take 20 mg by mouth 2 (two) times daily.   Yes [provider]  hydrocortisone valerate cream (WESTCORT) 0.2 % Apply 1 application topically 2 (two) times daily.  09/20/19  Yes [provider]  isosorbide mononitrate (IMDUR) 60 MG 24 hr tablet Take 60 mg by mouth every evening.   Yes [provider]  lactulose (CHRONULAC) 10 GM/15ML solution Take 10 g by mouth as needed for mild constipation.   Yes [provider]  metFORMIN (GLUCOPHAGE) 500 MG tablet Take 500 mg by mouth  2 (two) times daily. 09/20/19  Yes [provider]  metoprolol succinate (TOPROL-XL) 50 MG 24 hr tablet Take 50 mg by mouth 2 (two) times daily. 04/07/14  Yes [provider]  potassium chloride (KLOR-CON) 10 MEQ tablet Take 2 tablets (20 mEq total) by mouth daily for 3 days. 10/11/19 10/14/19 Yes Nuala Alpha A, PA-C  PROCTOSOL HC 2.5 % rectal cream Place 1 application  rectally daily as needed for hemorrhoids or itching.  11/06/13  Yes [provider]  ramipril (ALTACE) 10 MG capsule Take 10 mg by mouth 2 (two) times daily.    Yes [provider]  RESTASIS 0.05 % ophthalmic emulsion Place 1 drop into both eyes 2 (two) times daily.  09/09/19  Yes [provider]  senna-docusate (SENOKOT-S) 8.6-50 MG tablet Take 1 tablet by mouth daily. 10/09/19  Yes Lucrezia Starch, MD  promethazine (PHENERGAN) 12.5 MG tablet Take 1 tablet (12.5 mg total) by mouth every 8 (eight) hours as needed for up to 3 days for nausea or vomiting. Patient not taking: Reported on 10/12/2019 10/11/19 10/14/19  Deliah Boston, PA-C  [facility] injection Inject as directed once. 04/10/17   [provider]    Scheduled Meds: . aspirin EC  81 mg Oral Daily  . enoxaparin (LOVENOX) injection  40 mg Subcutaneous Q24H  . insulin aspart  0-15 Units Subcutaneous Q4H  . pantoprazole (PROTONIX) IV  40 mg Intravenous Q12H  . sorbitol, milk of mag, mineral oil, glycerin (SMOG) enema  960 mL Rectal Once   Continuous Infusions: . sodium chloride 75 mL/hr at 10/12/19 1317   PRN Meds:.acetaminophen **OR** acetaminophen, ondansetron (ZOFRAN) IV  Allergies as of 10/12/2019 - Review Complete 10/12/2019  Allergen Reaction Noted  . Losartan potassium Other (See Comments) 06/14/2013    Family History  Problem Relation Age of Onset  . CVA Mother   . Heart disease Sister   . Coronary artery disease Sister   . Heart disease Brother   . Coronary artery disease Brother   . Heart disease Brother   . Coronary artery disease Brother     Social History   Socioeconomic History  . Marital status: Married    Spouse name: Not on file  . Number of children: Not on file  . Years of education: Not on file  . Highest education level: Not on file  Occupational History  . Occupation: retired  Tobacco Use  . Smoking status: Former Smoker    Quit date: 06/14/1994    Years  since quitting: 25.3  . Smokeless tobacco: Never Used  Vaping Use  . Vaping Use: Never used  Substance and Sexual Activity  . Alcohol use: No  . Drug use: No  . Sexual activity: Not on file  Other Topics Concern  . Not on file  Social History Narrative  . Not on file   Social Determinants of Health   Financial Resource Strain:   . Difficulty of Paying Living Expenses:   Food Insecurity:   . Worried About Charity fundraiser in the Last Year:   . Arboriculturist in the Last Year:   Transportation Needs:   . Film/video editor (Medical):   Marland Kitchen Lack of Transportation (Non-Medical):   Physical Activity:   . Days of Exercise per Week:   . Minutes of Exercise per Session:   Stress:   . Feeling of Stress :   Social Connections:   . Frequency of Communication with Friends and Family:   .  Frequency of Social Gatherings with Friends and Family:   . Attends Religious Services:   . Active Member of Clubs or Organizations:   . Attends Archivist Meetings:   Marland Kitchen Marital Status:   Intimate Partner Violence:   . Fear of Current or Ex-Partner:   . Emotionally Abused:   Marland Kitchen Physically Abused:   . Sexually Abused:     Review of Systems: Review of Systems  Constitutional: Negative for chills and fever.  HENT: Negative for sore throat.   Eyes: Negative for pain and redness.  Respiratory: Negative for cough, shortness of breath and stridor.   Cardiovascular: Negative for chest pain and palpitations.  Gastrointestinal: Positive for abdominal pain, constipation, nausea and vomiting. Negative for blood in stool, diarrhea, heartburn and melena.  Genitourinary: Negative for flank pain and hematuria.  Musculoskeletal: Negative for falls and joint pain.  Skin: Negative for itching and rash.  Neurological: Negative for seizures and loss of consciousness.  Endo/Heme/Allergies: Negative for polydipsia. Does not bruise/bleed easily.  Psychiatric/Behavioral: Negative for substance abuse.  The patient is not nervous/anxious.      Physical Exam: Vital signs: Vitals:   10/12/19 1407 10/12/19 1452  BP: 110/73 (!) 120/94  Pulse: (!) 101 (!) 102  Resp: (!) 24 18  Temp:  (!) 97.5 F (36.4 C)  SpO2: 95% 96%     Physical Exam Constitutional:      General: He is in acute distress.     Appearance: He is ill-appearing.     Comments: Groaning, appears painful  HENT:     Head: Normocephalic and atraumatic.  Eyes:     General: No scleral icterus.    Extraocular Movements: Extraocular movements intact.     Comments: Periorbital ecchymosis  Cardiovascular:     Rate and Rhythm: Regular rhythm. Tachycardia present.     Heart sounds: Normal heart sounds.  Pulmonary:     Effort: Pulmonary effort is normal. No respiratory distress.     Breath sounds: Normal breath sounds.  Abdominal:     General: Bowel sounds are absent. There is distension (moderate).     Palpations: Abdomen is soft. There is no mass.     Tenderness: There is abdominal tenderness (moderate, diffuse but worse in epigastrium). There is no guarding or rebound.     Hernia: No hernia is present.  Genitourinary:    Comments: Digital rectal exam performed by Dr. Therisa Doyne, nurse present.  Empty rectal vault. Musculoskeletal:        General: No swelling or tenderness.     Right lower leg: No edema.     Left lower leg: No edema.  Skin:    General: Skin is warm and dry.  Neurological:     General: No focal deficit present.     Mental Status: He is alert and oriented to person, place, and time.  Psychiatric:        Mood and Affect: Mood normal.        Behavior: Behavior normal.     GI:  Lab Results: Recent Labs    10/11/19 1209 10/12/19 0446 10/12/19 1430  WBC 10.2 5.0 3.6*  HGB 13.1 14.0 14.1  HCT 39.4 42.0 43.9  PLT 266 231 258   BMET Recent Labs    10/11/19 1209 10/12/19 0320  NA 133* 135  K 3.1* 3.0*  CL 98 98  CO2 24 23  GLUCOSE 141* 186*  BUN 22 25*  CREATININE 0.69 0.98  CALCIUM 8.9  8.1*   LFT  Recent Labs    10/12/19 0320  PROT 6.2*  ALBUMIN 3.4*  AST 32  ALT 22  ALKPHOS 46  BILITOT 1.6*   PT/INR No results for input(s): LABPROT, INR in the last 72 hours.   Studies/Results: CT Angio Abd/Pel W and/or Wo Contrast  Result Date: 10/11/2019 CLINICAL DATA:  Persistent abdominal pain. Clinical concern for ischemia. EXAM: CTA ABDOMEN AND PELVIS WITHOUT AND WITH CONTRAST TECHNIQUE: Multidetector CT imaging of the abdomen and pelvis was performed using the standard protocol during bolus administration of intravenous contrast. Multiplanar reconstructed images and MIPs were obtained and reviewed to evaluate the vascular anatomy. CONTRAST:  159mL OMNIPAQUE IOHEXOL 350 MG/ML SOLN COMPARISON:  Contrast-enhanced CT 2 days ago. FINDINGS: Place not patient motion artifact limits assessment. VASCULAR Aorta: Atherosclerosis and tortuosity, mild-to-moderate. No aneurysm, dissection, or significant stenosis. No evidence of vasculitis. Celiac: Mild plaque at the origin but no significant stenosis. No dissection or acute findings. Branch vessels are patent, partially obscured by motion. SMA: Mild plaque in the SMA without significant stenosis. No dissection or vasculitis. Replaced right hepatic artery arising from the SMA. Mesenteric branch vessels are patent, allowing for motion limitations. Renals: Minor plaque at the origins without significant stenosis. No dissection or vasculitis. Single bilateral renal arteries. IMA: Patent without significant stenosis. Inflow: Mild atherosclerosis. No aneurysm, significant stenosis, dissection or vasculitis. Proximal Outflow: Bilateral common femoral and visualized portions of the superficial and profunda femoral arteries are patent without evidence of aneurysm, dissection, vasculitis or significant stenosis. Veins: Venous phase imaging demonstrates patency of the portal and superior mesenteric veins. No evidence of mesenteric venous thrombosis. IVC is  unremarkable. Review of the MIP images confirms the above findings. NON-VASCULAR Lower chest: No acute findings. Tiny subpleural nodule in the right lower lobe is unchanged. Calcified granuloma at the lung bases. Hepatobiliary: No focal hepatic lesion. Minimal focal fatty infiltration adjacent to the falciform ligament. Physiologically distended gallbladder without calcified gallstone. Stable mild biliary dilatation with common bile duct measuring 12 mm, unchanged. Pancreas: Stable proximal pancreatic ductal prominence 5 mm. No peripancreatic inflammation. No evidence of pancreatic mass. Spleen: Normal in size without focal abnormality. Adrenals/Urinary Tract: No adrenal nodule. No hydronephrosis. Multiple bilateral low-density lesions in both kidneys, incompletely characterized. Possibly enhancing right upper pole 12 mm lesion, series 11, image 57, unchanged over the past 2 days. Urinary bladder is unremarkable. Stomach/Bowel: Stomach partially distended. Equivocal pre pyloric gastric wall thickening versus peristalsis. No small bowel obstruction or inflammatory change. Scattered intraluminal fluid within pelvic small bowel, nonspecific. Normal appendix. Moderate volume of formed stool in the ascending and transverse colon. Small volume of formed stool in the descending colon. Scattered descending and sigmoid colonic diverticulosis without diverticulitis. Sigmoid colon is redundant. There is minimal liquid stool in the distal sigmoid colon and rectum. No colonic wall thickening or pericolonic edema, particularly at the splenic flexure. No findings to suggest bowel ischemia. No pneumatosis. Lymphatic: No adenopathy. Reproductive: No acute findings.  Stable markers in the prostate. Other: No free air, free fluid, or intra-abdominal fluid collection. Musculoskeletal: There are no acute or suspicious osseous abnormalities. IMPRESSION: VASCULAR. 1. Mild aortic and branch atherosclerosis. No acute vascular findings. 2.  No significant arterial stenosis of the mesenteric vasculature. Mesenteric vessels are widely patent. NON-VASCULAR. 1. No findings to suggest bowel ischemia. 2. Colonic diverticulosis without diverticulitis. Moderate stool in the proximal colon. 3. Equivocal pre pyloric gastric wall thickening versus peristalsis. 4. Bilateral renal cysts. Possibly enhancing 12 mm lesion upper pole right kidney is unchanged, possibly  located renal neoplasm. 5. Stable biliary dilatation. Aortic Atherosclerosis (ICD10-I70.0). Electronically Signed   By: Keith Rake M.D.   On: 10/11/2019 15:55    Impression: Obstipation and abdominal pain.  Patient appears uncomfortable and is groaning in pain. Abdomen is moderately distended with absent bowel sounds.  Suspect severe constipation.  Bowel perforation is a consideration due to pain and distention; however, no leukocytosis is present and BP remains normal to elevated. -WBCs 3.6 -K+ 4.6, Mg 2.6  Hematemesis x 2 today.  Gastritis vs. PUD -Hgb 14.6, stable  Plan: -Abdominal films ordered to rule out bowel perforation due to abdominal distention and severe pain -IV Protonix BID for hematemesis.  Patient will need EGD at some point during this hospitalization, but we will defer this until acute abdominal pain and constipation has improved. -Continue to monitor H&H -Continue antiemetics PRN  [facility] will follow.   LOS: 0 days   Salley Slaughter  PA-C 10/12/2019, 3:47 PM  Contact #  [phone]

## 2019-10-12 NOTE — Consult Note (Addendum)
NAME:  Matthew Schwartz, MRN:  [id number], DOB:  [birth year], LOS: 0 ADMISSION DATE:  10/12/2019, CONSULTATION DATE:  10/12/2019 REFERRING MD:  Dr. [name], Surgery, CHIEF COMPLAINT:  Abdominal pain  Brief History   86 yo male former smoker presented with nausea/vomiting, abdominal pain and constipation x 4 to 5 days.  Concern for acute abdomen and taken for exploratory laparotomy on 6/14.  Found to have perforated duodenal ulcer.  Remained on vent post op.  PCCM consulted for critical care management.  History of present illness   86 yo male was in ER twice within past several days for evaluation of nausea/vomiting, abdominal pain, and constipation.  Imaging studies were unremarkable for a cause.  He returned to ER on 6/14 with worsening symptoms.  He was given SMOG enema.  He was seen by GI.  He was also seen by general surgery.  Decision made to proceed with exploratory laparotomy.  He was found to have perforated duodenal ulcer and had omental patch repair.  Past Medical History  Prostate cancer, HTN, GERD, HLD, DM  Significant Hospital Events   6/14 Admit, exploratory laparotomy, omental patch repair of duodenal ulcer  Consults:  Gastroenterology General surgery  Procedures:  Rt IJ CVL 6/14 >>  Rt radial a line 6/14 >> ETT 6/14 >>  Significant Diagnostic Tests:  CT angio abd/pelvis 6/13 >> mild atherosclerosis, no significant arterial stenosis of mesenteric vasculature, no findings of bowel ischemia, colonic diverticulosis w/o diverticulitis, b/l renal cysts, 12 mm lesion upper pole Rt kidney  Micro Data:  SARS CoV2 6/13 >> negative  Antimicrobials:  Zosyn 6/14 >>  Interim history/subjective:    Objective   Blood pressure (!) 143/80, pulse (!) 119, temperature (!) 97.5 F (36.4 C), temperature source Oral, resp. rate 17, SpO2 94 %.        Intake/Output Summary (Last 24 hours) at 10/12/2019 1850 Last data filed at 10/12/2019 1818 Gross per 24 hour  Intake 3750 ml  Output --   Net 3750 ml   There were no vitals filed for this visit.  Examination:  General - sedated Eyes - pupils reactive ENT - ETT in place Cardiac - regular rate/rhythm, no murmur Chest - equal breath sounds b/l, no wheezing or rales Abdomen - wound dressing clean, JP drain in place Extremities - no cyanosis, clubbing, or edema Skin - no rashes Neuro - RASS -3   Resolved Hospital Problem list     Assessment & Plan:   Septic shock from peritonitis in setting of perforated duodenal ulcer s/p omental patch repair. - pressors to keep MAP > 65 - continue IV fluids - continue antibiotics - post op care, nutrition per CCS - protonix bid  Acute respiratory failure with hypoxia in setting of sepsis and peritonitis. - full vent support - f/u CXR, ABG  DM type II poorly controlled with hyperglycemia. - SSI - hold outpt metformin  Hx of HTN, HLD. - hold outpt ASA, norvasc, lipitor, imdur, toprol, altace  Best practice:  Diet: NPO DVT prophylaxis: SCDs GI prophylaxis: Protonix Mobility: Bed rest Code Status: full cod Disposition: ICU  Labs   CBC: Recent Labs  Lab 10/09/19 1054 10/11/19 1209 10/12/19 0446 10/12/19 1430  WBC 7.9 10.2 5.0 3.6*  NEUTROABS  --   --  4.2  --   HGB 13.7 13.1 14.0 14.1  HCT 41.8 39.4 42.0 43.9  MCV 79.9* 79.0* 80.5 81.1  PLT 259 266 231 062    Basic Metabolic Panel: Recent Labs  Lab  10/09/19 1054 10/09/19 1234 10/11/19 1209 10/12/19 0320 10/12/19 1430  NA 132* 133* 133* 135 134*  K 5.5* 4.3 3.1* 3.0* 4.6  CL 97* 98 98 98 102  CO2 23 24 24 23  20*  GLUCOSE 157* 135* 141* 186* 196*  BUN 20 19 22  25* 34*  CREATININE 0.93 0.71 0.69 0.98 1.24  CALCIUM 9.0 8.7* 8.9 8.1* 7.8*  MG  --   --   --   --  2.6*   GFR: Estimated Creatinine Clearance: 50.2 mL/min (by C-G formula based on SCr of 1.24 mg/dL). Recent Labs  Lab 10/09/19 1054 10/11/19 1209 10/11/19 1355 10/12/19 0446 10/12/19 1430  WBC 7.9 10.2  --  5.0 3.6*  LATICACIDVEN   --   --  1.7  --   --     Liver Function Tests: Recent Labs  Lab 10/09/19 1054 10/11/19 1209 10/12/19 0320  AST 52* 31 32  ALT 15 22 22   ALKPHOS 55 55 46  BILITOT 1.7* 1.5* 1.6*  PROT 7.7 7.0 6.2*  ALBUMIN 4.2 3.9 3.4*   Recent Labs  Lab 10/09/19 1054 10/11/19 1209 10/12/19 0320  LIPASE 33 36 44   No results for input(s): AMMONIA in the last 168 hours.  ABG No results found for: PHART, PCO2ART, PO2ART, HCO3, TCO2, ACIDBASEDEF, O2SAT   Coagulation Profile: No results for input(s): INR, PROTIME in the last 168 hours.  Cardiac Enzymes: No results for input(s): CKTOTAL, CKMB, CKMBINDEX, TROPONINI in the last 168 hours.  HbA1C: Hgb A1c MFr Bld  Date/Time Value Ref Range Status  10/12/2019 02:30 PM 6.6 (H) 4.8 - 5.6 % Final    Comment:    (NOTE) Pre diabetes:          5.7%-6.4%  Diabetes:              >6.4%  Glycemic control for   <7.0% adults with diabetes     CBG: Recent Labs  Lab 10/12/19 1546  GLUCAP 214*    Review of Systems:   Unable to obtain  Past Medical History  He,  has a past medical history of Diabetes mellitus without complication ([place]), Dyslipidemia, GERD (gastroesophageal reflux disease), Hypertension, Prostate cancer ([place]) (12/23/13), PSA elevation, and S/P radiation therapy (05/18/2014 through 07/16/2014 ).   Surgical History    Past Surgical History:  Procedure Laterality Date  . ANGIOPLASTY  2000  . cataract surgery  2000  . COLONOSCOPY    . PROSTATE BIOPSY  12/23/13  . REPLACEMENT TOTAL KNEE  2008/2009   bilateral     Social History   reports that he quit smoking about 25 years ago. He has never used smokeless tobacco. He reports that he does not drink alcohol and does not use drugs.   Family History   His family history includes CVA in his mother; Coronary artery disease in his brother, brother, and sister; Heart disease in his brother, brother, and sister.    Allergies Allergies  Allergen Reactions  . Losartan Potassium Other (See Comments)    Unknown reaction that happened 15 years ago     Home Medications  Prior to Admission medications   Medication Sig Start Date End Date Taking? Authorizing Provider  alfuzosin (UROXATRAL) 10 MG 24 hr tablet Take 10 mg by mouth daily. 09/07/19  Yes [provider]  amLODipine (NORVASC) 5 MG tablet Take 5 mg by mouth daily. 07/13/19  Yes [provider]  aspirin 81 MG tablet Take 81 mg by mouth every morning.  Yes [provider]  atorvastatin (LIPITOR) 40 MG tablet Take 40 mg by mouth at bedtime.    Yes [provider]  dicyclomine (BENTYL) 20 MG tablet Take 1 tablet (20 mg total) by mouth 2 (two) times daily. 10/11/19  Yes Nuala Alpha A, PA-C  famotidine (PEPCID) 20 MG tablet Take 20 mg by mouth 2 (two) times daily.   Yes [provider]  hydrocortisone valerate cream (WESTCORT) 0.2 % Apply 1 application topically 2 (two) times daily.  09/20/19  Yes [provider]  isosorbide mononitrate (IMDUR) 60 MG 24 hr tablet Take 60 mg by mouth every evening.   Yes [provider]  lactulose (CHRONULAC) 10 GM/15ML solution Take 10 g by mouth as needed for mild constipation.   Yes [provider]  metFORMIN (GLUCOPHAGE) 500 MG tablet Take 500 mg by mouth 2 (two) times daily. 09/20/19  Yes [provider]  metoprolol succinate (TOPROL-XL) 50 MG 24 hr tablet Take 50 mg by mouth 2 (two) times daily. 04/07/14  Yes [provider]  potassium chloride (KLOR-CON) 10 MEQ tablet Take 2 tablets (20 mEq total) by mouth daily for 3 days. 10/11/19 10/14/19 Yes Nuala Alpha A, PA-C  PROCTOSOL HC 2.5 % rectal cream Place 1 application rectally daily as needed for hemorrhoids or itching.  11/06/13  Yes [provider]  ramipril (ALTACE) 10 MG capsule Take 10 mg by mouth 2 (two) times daily.    Yes [provider]  RESTASIS  0.05 % ophthalmic emulsion Place 1 drop into both eyes 2 (two) times daily.  09/09/19  Yes [provider]  senna-docusate (SENOKOT-S) 8.6-50 MG tablet Take 1 tablet by mouth daily. 10/09/19  Yes Lucrezia Starch, MD  promethazine (PHENERGAN) 12.5 MG tablet Take 1 tablet (12.5 mg total) by mouth every 8 (eight) hours as needed for up to 3 days for nausea or vomiting. Patient not taking: Reported on 10/12/2019 10/11/19 10/14/19  Deliah Boston, PA-C  [facility] injection Inject as directed once. 04/10/17   [provider]     Critical care time: 36 minutes  Chesley Mires, MD [place] Pager - [phone] 10/12/2019, 6:51 PM

## 2019-10-12 NOTE — ED Notes (Signed)
GI at bedside speaking with patient.

## 2019-10-12 NOTE — Anesthesia Postprocedure Evaluation (Signed)
Anesthesia Post Note  Patient: Matthew Schwartz  Procedure(s) Performed: EXPLORATORY LAPAROTOMY omental patch repair perforated duodenal ulcer (N/A Abdomen)     Patient location during evaluation: SICU Anesthesia Type: General Level of consciousness: sedated Pain management: pain level controlled Vital Signs Assessment: post-procedure vital signs reviewed and stable Respiratory status: patient remains intubated per anesthesia plan Cardiovascular status: stable Postop Assessment: no apparent nausea or vomiting Anesthetic complications: no   No complications documented.  Last Vitals:  Vitals:   10/12/19 1612 10/12/19 1630  BP: (!) 143/80   Pulse: (!) 119 (!) 125  Resp: 17 17  Temp: (!) 36.4 C   SpO2: 94% 96%    Last Pain:  Vitals:   10/12/19 1612  TempSrc: Oral  PainSc:                  [place]

## 2019-10-12 NOTE — Anesthesia Preprocedure Evaluation (Addendum)
Anesthesia Evaluation  Patient identified by MRN, date of birth, ID band Patient awake    Reviewed: Allergy & Precautions, NPO status , Patient's Chart, lab work & pertinent test results, Unable to perform ROS - Chart review onlyPreop documentation limited or incomplete due to emergent nature of procedure.  History of Anesthesia Complications Negative for: history of anesthetic complications  Airway Mallampati: II  TM Distance: >3 FB Neck ROM: Full    Dental  (+) Edentulous Upper, Dental Advisory Given   Pulmonary former smoker,    + rhonchi        Cardiovascular hypertension, Pt. on medications + CAD   Rhythm:Regular Rate:Tachycardia     Neuro/Psych    GI/Hepatic Acute abdomen   Endo/Other  diabetes  Renal/GU      Musculoskeletal   Abdominal   Peds  Hematology   Anesthesia Other Findings   Reproductive/Obstetrics                            Anesthesia Physical Anesthesia Plan  ASA: IV and emergent  Anesthesia Plan: General   Post-op Pain Management:    Induction: Intravenous, Rapid sequence and Cricoid pressure planned  PONV Risk Score and Plan: 4 or greater and Ondansetron, Dexamethasone and Treatment may vary due to age or medical condition  Airway Management Planned: Oral ETT  Additional Equipment: Arterial line and CVP  Intra-op Plan:   Post-operative Plan: Possible Post-op intubation/ventilation  Informed Consent: I have reviewed the patients History and Physical, chart, labs and discussed the procedure including the risks, benefits and alternatives for the proposed anesthesia with the patient or authorized representative who has indicated his/her understanding and acceptance.     Dental advisory given  Plan Discussed with: CRNA, Anesthesiologist and Surgeon  Anesthesia Plan Comments:        Anesthesia Quick Evaluation

## 2019-10-12 NOTE — Anesthesia Procedure Notes (Signed)
Procedure Name: Intubation Date/Time: 10/12/2019 4:55 PM Performed by: Lissa Morales, CRNA Pre-anesthesia Checklist: Patient identified, Emergency Drugs available, Suction available and Patient being monitored Patient Re-evaluated:Patient Re-evaluated prior to induction Oxygen Delivery Method: Circle system utilized Preoxygenation: Pre-oxygenation with 100% oxygen Induction Type: IV induction, Cricoid Pressure applied and Rapid sequence Laryngoscope Size: Mac and 4 Tube type: Oral Tube size: 8.0 mm Number of attempts: 1 Airway Equipment and Method: Stylet and Oral airway Placement Confirmation: ETT inserted through vocal cords under direct vision,  positive ETCO2 and breath sounds checked- equal and bilateral Secured at: 22 cm Tube secured with: Tape Dental Injury: Teeth and Oropharynx as per pre-operative assessment

## 2019-10-12 NOTE — H&P (Addendum)
[facility] History and Physical  Matthew Schwartz CHE:[id number] DOB: August 28, 1932 DOA: 10/12/2019  Referring physician:  PCP: Matthew Fess, MD   Chief Complaint:   HPI: Matthew Schwartz is a 86 y.o. WM  who was seen in the ED yesterday morning for abdominal pain.  The abdominal pain has been present for 4 to 5 days and was described as a generalized cramping and sharp sensation.  Nothing made it better or worse.  He had associated nausea and one episode of vomiting.  He had been seen in the ED 2 days earlier for similar complaints.  CT of the abdomen and pelvis on 10/09/2019 showed no acute intra-abdominal process and a chronic appearing renal mass.  CT angiogram of the abdomen and pelvis performed yesterday showed no evidence of bowel ischemia.  He was treated with IV fluids, Zofran, morphine and Dilaudid and discharged home yesterday evening after demonstrating ability to drink without vomiting.  The hospitalist was consulted but decided the patient did not need admission at that time and he was discharged home about 5:41 PM.  The patient returns with complaints of continued abdominal pain, nausea and vomiting.  He reports vomiting about 10 times since discharge.  He rates his abdominal pain is an 8 out of 10, aching in nature with also cramping.  He feels that his abdomen is distended and feels like he needs to have a bowel movement, even requesting an enema, even though his CT scan shows minimal stool in the descending and sigmoid colon.  He has no history of cardiac rhythm problems problems (he did have angioplasty in 2000) and sees his cardiologist annually.  His EKG yesterday showed ventricular bigeminy and his rhythm strip shows frequent ectopy and frequent runs of tachycardia without obvious P waves; these runs have some irregularity of rhythm suggestive of paroxysmal atrial fibrillation and the QRS complexes are approximately the same with as his atrially paced complexes.    Review of  Systems:  Covid vaccination; positive vaccination  Constitutional:  No weight loss, night sweats, Fevers, chills, fatigue.  HEENT:  No headaches, Difficulty swallowing,Tooth/dental problems,Sore throat,  No sneezing, itching, ear ache, nasal congestion, post nasal drip,  Cardio-vascular:  No chest pain, Orthopnea, PND, swelling in lower extremities, anasarca, dizziness, palpitations  GI:  No heartburn, indigestion, positive abdominal pain, nausea, vomiting, negative diarrhea,positive change in bowel habits (constipated), loss of appetite  Resp:  No shortness of breath with exertion or at rest. No excess mucus, no productive cough, No non-productive cough, No coughing up of blood.No change in color of mucus.No wheezing.No chest wall deformity  Skin:  no rash or lesions.  GU:  no dysuria,positive change in color of urine (dark yellow), no urgency or frequency. No flank pain.  Musculoskeletal:  No joint pain or swelling. No decreased range of motion. No back pain.  Psych:  No change in mood or affect. No depression or anxiety. No memory loss.   Past Medical History:  Diagnosis Date  . Diabetes mellitus without complication ([place])   . Dyslipidemia   . GERD (gastroesophageal reflux disease)   . Hypertension   . Prostate cancer ([place]) 12/23/13   Adenocarcinoma  . PSA elevation   . S/P radiation therapy 05/18/2014 through 07/16/2014    Prostate 7800 cGy in 40 sessions, seminal vesicles 5600 cGy in 40 sessions    Past Surgical History:  Procedure Laterality Date  . ANGIOPLASTY  2000  . cataract surgery  2000  . COLONOSCOPY    . PROSTATE BIOPSY  12/23/13  . REPLACEMENT TOTAL KNEE  2008/2009   bilateral   Social History:  reports that he quit smoking about 25 years ago. He has never used smokeless tobacco. He reports that he does not drink alcohol and does not use drugs.  Allergies  Allergen Reactions  .  Losartan Potassium Other (See Comments)    Unknown reaction that happened 15 years ago    Family History  Problem Relation Age of Onset  . CVA Mother   . Heart disease Sister   . Coronary artery disease Sister   . Heart disease Brother   . Coronary artery disease Brother   . Heart disease Brother   . Coronary artery disease Brother      Prior to Admission medications   Medication Sig Start Date End Date Taking? Authorizing Provider  alfuzosin (UROXATRAL) 10 MG 24 hr tablet Take 10 mg by mouth daily. 09/07/19  Yes [provider]  amLODipine (NORVASC) 5 MG tablet Take 5 mg by mouth daily. 07/13/19  Yes [provider]  aspirin 81 MG tablet Take 81 mg by mouth every morning.    Yes [provider]  atorvastatin (LIPITOR) 40 MG tablet Take 40 mg by mouth at bedtime.    Yes [provider]  dicyclomine (BENTYL) 20 MG tablet Take 1 tablet (20 mg total) by mouth 2 (two) times daily. 10/11/19  Yes Nuala Alpha A, PA-C  famotidine (PEPCID) 20 MG tablet Take 20 mg by mouth 2 (two) times daily.   Yes [provider]  hydrocortisone valerate cream (WESTCORT) 0.2 % Apply 1 application topically 2 (two) times daily.  09/20/19  Yes [provider]  isosorbide mononitrate (IMDUR) 60 MG 24 hr tablet Take 60 mg by mouth every evening.   Yes [provider]  lactulose (CHRONULAC) 10 GM/15ML solution Take 10 g by mouth as needed for mild constipation.   Yes [provider]  metFORMIN (GLUCOPHAGE) 500 MG tablet Take 500 mg by mouth 2 (two) times daily. 09/20/19  Yes [provider]  metoprolol succinate (TOPROL-XL) 50 MG 24 hr tablet Take 50 mg by mouth 2 (two) times daily. 04/07/14  Yes [provider]  potassium chloride (KLOR-CON) 10 MEQ tablet Take 2 tablets (20 mEq total) by mouth daily for 3 days. 10/11/19 10/14/19 Yes Nuala Alpha A, PA-C  PROCTOSOL HC 2.5 % rectal cream Place 1 application rectally daily as  needed for hemorrhoids or itching.  11/06/13  Yes [provider]  ramipril (ALTACE) 10 MG capsule Take 10 mg by mouth 2 (two) times daily.    Yes [provider]  RESTASIS 0.05 % ophthalmic emulsion Place 1 drop into both eyes 2 (two) times daily.  09/09/19  Yes [provider]  senna-docusate (SENOKOT-S) 8.6-50 MG tablet Take 1 tablet by mouth daily. 10/09/19  Yes Lucrezia Starch, MD  promethazine (PHENERGAN) 12.5 MG tablet Take 1 tablet (12.5 mg total) by mouth every 8 (eight) hours as needed for up to 3 days for nausea or vomiting. Patient not taking: Reported on 10/12/2019 10/11/19 10/14/19  Deliah Boston, PA-C  [facility] injection Inject as directed once. 04/10/17   [provider]     Consultants:  [facility]   Procedures/Significant Events:  6/13 CTA abdomen pelvis; negative  findings to suggest bowel ischemia. -Colonic diverticulosis without diverticulitis. Moderate stool in the proximal colon. -. Equivocal pre pyloric gastric wall thickening versus peristalsis. -Bilateral renal cysts. Possibly enhancing 12 mm lesion upper pole right  kidney is unchanged, possibly located renal neoplasm. -Stable biliary dilatation.    I have personally reviewed and interpreted all radiology studies and my findings are as above.   VENTILATOR SETTINGS:    Cultures 6/13 SARS coronavirus negative  Antimicrobials:    Devices    LINES / TUBES:      Continuous Infusions:  Physical Exam: Vitals:   10/12/19 0853 10/12/19 0946 10/12/19 1046 10/12/19 1201  BP:  (!) 143/65 (!) 125/109 115/74  Pulse: 85 92 91 89  Resp: (!) 34 (!) 21 15 (!) 31  Temp:      SpO2: 100% 98% 99% 98%    Wt Readings from Last 3 Encounters:  10/09/19 90.7 kg  07/22/19 93.4 kg  01/14/19 94.3 kg    General: A/O x4, no acute respiratory distress Eyes: negative scleral hemorrhage, negative anisocoria, negative icterus ENT: Negative Runny nose, negative gingival  bleeding, Neck:  Negative scars, masses, torticollis, lymphadenopathy, JVD Lungs: Clear to auscultation bilaterally without wheezes or crackles Cardiovascular: Regular rate and rhythm without murmur gallop or rub normal S1 and S2 Abdomen: Positive abdominal pain, Positive distended, negative/hypoactive bowel sounds, no rebound, no ascites, no appreciable mass Extremities: No significant cyanosis, clubbing, or edema bilateral lower extremities Skin: Negative rashes, lesions, ulcers Psychiatric:  Negative depression, negative anxiety, negative fatigue, negative mania  Central nervous system:  Cranial nerves II through XII intact, tongue/uvula midline, all extremities muscle strength 5/5, sensation intact throughout, negative dysarthria, negative expressive aphasia, negative receptive aphasia.        Labs on Admission:  Basic Metabolic Panel: Recent Labs  Lab 10/09/19 1054 10/09/19 1234 10/11/19 1209 10/12/19 0320  NA 132* 133* 133* 135  K 5.5* 4.3 3.1* 3.0*  CL 97* 98 98 98  CO2 23 24 24 23   GLUCOSE 157* 135* 141* 186*  BUN 20 19 22  25*  CREATININE 0.93 0.71 0.69 0.98  CALCIUM 9.0 8.7* 8.9 8.1*   Liver Function Tests: Recent Labs  Lab 10/09/19 1054 10/11/19 1209 10/12/19 0320  AST 52* 31 32  ALT 15 22 22   ALKPHOS 55 55 46  BILITOT 1.7* 1.5* 1.6*  PROT 7.7 7.0 6.2*  ALBUMIN 4.2 3.9 3.4*   Recent Labs  Lab 10/09/19 1054 10/11/19 1209 10/12/19 0320  LIPASE 33 36 44   No results for input(s): AMMONIA in the last 168 hours. CBC: Recent Labs  Lab 10/09/19 1054 10/11/19 1209 10/12/19 0446  WBC 7.9 10.2 5.0  NEUTROABS  --   --  4.2  HGB 13.7 13.1 14.0  HCT 41.8 39.4 42.0  MCV 79.9* 79.0* 80.5  PLT 259 266 231   Cardiac Enzymes: No results for input(s): CKTOTAL, CKMB, CKMBINDEX, TROPONINI in the last 168 hours.  BNP (last 3 results) No results for input(s): BNP in the last 8760 hours.  ProBNP (last 3 results) No results for input(s): PROBNP in the last 8760  hours.  CBG: No results for input(s): GLUCAP in the last 168 hours.  Radiological Exams on Admission: CT Angio Abd/Pel W and/or Wo Contrast  Result Date: 10/11/2019 CLINICAL DATA:  Persistent abdominal pain. Clinical concern for ischemia. EXAM: CTA ABDOMEN AND PELVIS WITHOUT AND WITH CONTRAST TECHNIQUE: Multidetector CT imaging of the abdomen and pelvis was performed using the standard protocol during bolus administration of intravenous contrast. Multiplanar reconstructed images and MIPs were obtained and reviewed to evaluate the vascular anatomy. CONTRAST:  171mL OMNIPAQUE IOHEXOL 350 MG/ML SOLN COMPARISON:  Contrast-enhanced CT 2 days ago. FINDINGS: Place not patient motion artifact  limits assessment. VASCULAR Aorta: Atherosclerosis and tortuosity, mild-to-moderate. No aneurysm, dissection, or significant stenosis. No evidence of vasculitis. Celiac: Mild plaque at the origin but no significant stenosis. No dissection or acute findings. Branch vessels are patent, partially obscured by motion. SMA: Mild plaque in the SMA without significant stenosis. No dissection or vasculitis. Replaced right hepatic artery arising from the SMA. Mesenteric branch vessels are patent, allowing for motion limitations. Renals: Minor plaque at the origins without significant stenosis. No dissection or vasculitis. Single bilateral renal arteries. IMA: Patent without significant stenosis. Inflow: Mild atherosclerosis. No aneurysm, significant stenosis, dissection or vasculitis. Proximal Outflow: Bilateral common femoral and visualized portions of the superficial and profunda femoral arteries are patent without evidence of aneurysm, dissection, vasculitis or significant stenosis. Veins: Venous phase imaging demonstrates patency of the portal and superior mesenteric veins. No evidence of mesenteric venous thrombosis. IVC is unremarkable. Review of the MIP images confirms the above findings. NON-VASCULAR Lower chest: No acute  findings. Tiny subpleural nodule in the right lower lobe is unchanged. Calcified granuloma at the lung bases. Hepatobiliary: No focal hepatic lesion. Minimal focal fatty infiltration adjacent to the falciform ligament. Physiologically distended gallbladder without calcified gallstone. Stable mild biliary dilatation with common bile duct measuring 12 mm, unchanged. Pancreas: Stable proximal pancreatic ductal prominence 5 mm. No peripancreatic inflammation. No evidence of pancreatic mass. Spleen: Normal in size without focal abnormality. Adrenals/Urinary Tract: No adrenal nodule. No hydronephrosis. Multiple bilateral low-density lesions in both kidneys, incompletely characterized. Possibly enhancing right upper pole 12 mm lesion, series 11, image 57, unchanged over the past 2 days. Urinary bladder is unremarkable. Stomach/Bowel: Stomach partially distended. Equivocal pre pyloric gastric wall thickening versus peristalsis. No small bowel obstruction or inflammatory change. Scattered intraluminal fluid within pelvic small bowel, nonspecific. Normal appendix. Moderate volume of formed stool in the ascending and transverse colon. Small volume of formed stool in the descending colon. Scattered descending and sigmoid colonic diverticulosis without diverticulitis. Sigmoid colon is redundant. There is minimal liquid stool in the distal sigmoid colon and rectum. No colonic wall thickening or pericolonic edema, particularly at the splenic flexure. No findings to suggest bowel ischemia. No pneumatosis. Lymphatic: No adenopathy. Reproductive: No acute findings.  Stable markers in the prostate. Other: No free air, free fluid, or intra-abdominal fluid collection. Musculoskeletal: There are no acute or suspicious osseous abnormalities. IMPRESSION: VASCULAR. 1. Mild aortic and branch atherosclerosis. No acute vascular findings. 2. No significant arterial stenosis of the mesenteric vasculature. Mesenteric vessels are widely patent.  NON-VASCULAR. 1. No findings to suggest bowel ischemia. 2. Colonic diverticulosis without diverticulitis. Moderate stool in the proximal colon. 3. Equivocal pre pyloric gastric wall thickening versus peristalsis. 4. Bilateral renal cysts. Possibly enhancing 12 mm lesion upper pole right kidney is unchanged, possibly located renal neoplasm. 5. Stable biliary dilatation. Aortic Atherosclerosis (ICD10-I70.0). Electronically Signed   By: Keith Rake M.D.   On: 10/11/2019 15:55    EKG: Independently reviewed.   Assessment/Plan Active Problems:   HTN (hypertension)   Malignant neoplasm of prostate ([place])   Hyperlipidemia   Obstipation   HLD (hyperlipidemia)   Diabetes mellitus type 2, uncontrolled, with complications ([facility])  Obstipation -Magnesium citrate x1 bottle -Smog enema. -N.p.o. -Minimize narcotics as this will only exacerbate patient's condition. -Zofran for nausea -If this does not resolve patient's moderate stool burden will need to consult surgery/GI. -6/14 patient did not tolerate magnesium citrate, Gastroccult pending to determine if there was any blood in vomit.  Consulted [facility] for additional recommendations.  Malignant  neoplasm of prostate -Per EMR T1 N0 M0 -S/p XRT without any evidence of relapsed disease per Dr. Zola Button oncology note 01/14/2019  RIGHT Renal neoplasm? -6/13 suggest loculated, stable renal neoplasm -We will consult nephrology to look CAT scan for their opinion and recommendation -No mention of renal neoplasm in Dr. Zola Button oncology note 01/14/2019 -Radiology reads this is being stable for the previous 5 to 6 years, unsure needs to be pursued, but acutely would not pursue.  Once patient's acute issue controlled prior to discharge may want to touch base with radiology on when/if needs to be rechecked  Essential HTN/Hypotension -Patient currently slightly hypotensive secondary to dehydration -Normal saline 51ml/hr  Diabetes type 2  uncontrolled with complication -Metformin 612 mg BID (hold) -Moderate SSI  HLD -Lipitor 40 mg daily (hold) -Lipid panel pending    Code Status: Full (DVT Prophylaxis: Lovenox Family Communication: 6/14 daughter at bedside for discussion of plan of care Status is: Inpatient    Dispo: The patient is from: Home              Anticipated d/c is to: Home              Anticipated d/c date is: 6/16              Patient currently unstable     Data Reviewed: Care during the described time interval was provided by me .  I have reviewed this patient's available data, including medical history, events of note, physical examination, and all test results as part of my evaluation.   The patient is critically ill with multiple organ systems failure and requires high complexity decision making for assessment and support, frequent evaluation and titration of therapies, application of advanced monitoring technologies and extensive interpretation of multiple databases. Critical Care Time devoted to patient care services described in this note  Time spent: 67 minutes   Aveon Schwartz, [facility] Pager [phone]

## 2019-10-12 NOTE — Transfer of Care (Signed)
Immediate Anesthesia Transfer of Care Note  Patient: Matthew Schwartz  Procedure(s) Performed: EXPLORATORY LAPAROTOMY omental patch repair perforated duodenal ulcer (N/A Abdomen)  Patient Location: PACU and ICU  Anesthesia Type:General  Level of Consciousness: Patient remains intubated per anesthesia plan  Airway & Oxygen Therapy: Patient remains intubated per anesthesia plan  Post-op Assessment: Report given to RN  Post vital signs: stable  Last Vitals:  Vitals Value Taken Time  BP    Temp    Pulse 80 10/12/19 1848  Resp 18 10/12/19 1847  SpO2 97 % 10/12/19 1848  Vitals shown include unvalidated device data.  Last Pain:  Vitals:   10/12/19 1612  TempSrc: Oral  PainSc:       Patients Stated Pain Goal: 0 (81/44/81 8563)  Complications: No complications documented.

## 2019-10-12 NOTE — ED Triage Notes (Signed)
Patient arrived via [facility] with complaints of abdominal pain and NV. Patient was discharged this morning. Patient reports vomiting 10x since discharge and continued abdominal pain. Given  4mg  zofran, 100 fentanyl , and 300  NS en route. Patient normally on RA. EMS placed on 4L O2 due to an oxygen saturation of 86% 134/56 192

## 2019-10-13 ENCOUNTER — Encounter (HOSPITAL_COMMUNITY): Payer: Self-pay | Admitting: Surgery

## 2019-10-13 ENCOUNTER — Inpatient Hospital Stay (HOSPITAL_COMMUNITY): Payer: Medicare Other

## 2019-10-13 ENCOUNTER — Inpatient Hospital Stay: Payer: Self-pay

## 2019-10-13 DIAGNOSIS — A419 Sepsis, unspecified organism: Principal | ICD-10-CM

## 2019-10-13 DIAGNOSIS — R6521 Severe sepsis with septic shock: Secondary | ICD-10-CM

## 2019-10-13 DIAGNOSIS — R9431 Abnormal electrocardiogram [ECG] [EKG]: Secondary | ICD-10-CM

## 2019-10-13 LAB — GLUCOSE, CAPILLARY
Glucose-Capillary: 117 mg/dL — ABNORMAL HIGH (ref 70–99)
Glucose-Capillary: 123 mg/dL — ABNORMAL HIGH (ref 70–99)
Glucose-Capillary: 136 mg/dL — ABNORMAL HIGH (ref 70–99)
Glucose-Capillary: 145 mg/dL — ABNORMAL HIGH (ref 70–99)
Glucose-Capillary: 149 mg/dL — ABNORMAL HIGH (ref 70–99)
Glucose-Capillary: 157 mg/dL — ABNORMAL HIGH (ref 70–99)
Glucose-Capillary: 178 mg/dL — ABNORMAL HIGH (ref 70–99)

## 2019-10-13 LAB — CBC WITH DIFFERENTIAL/PLATELET
Abs Immature Granulocytes: 0 10*3/uL (ref 0.00–0.07)
Band Neutrophils: 5 %
Basophils Absolute: 0 10*3/uL (ref 0.0–0.1)
Basophils Relative: 0 %
Eosinophils Absolute: 0 10*3/uL (ref 0.0–0.5)
Eosinophils Relative: 0 %
HCT: 37.6 % — ABNORMAL LOW (ref 39.0–52.0)
Hemoglobin: 12.4 g/dL — ABNORMAL LOW (ref 13.0–17.0)
Lymphocytes Relative: 11 %
Lymphs Abs: 0.5 10*3/uL — ABNORMAL LOW (ref 0.7–4.0)
MCH: 26.5 pg (ref 26.0–34.0)
MCHC: 33 g/dL (ref 30.0–36.0)
MCV: 80.3 fL (ref 80.0–100.0)
Monocytes Absolute: 0.3 10*3/uL (ref 0.1–1.0)
Monocytes Relative: 7 %
Neutro Abs: 3.4 10*3/uL (ref 1.7–7.7)
Neutrophils Relative %: 77 %
Platelets: 211 10*3/uL (ref 150–400)
RBC: 4.68 MIL/uL (ref 4.22–5.81)
RDW: 17.2 % — ABNORMAL HIGH (ref 11.5–15.5)
WBC: 4.1 10*3/uL (ref 4.0–10.5)
nRBC: 0 % (ref 0.0–0.2)

## 2019-10-13 LAB — URINE CULTURE: Culture: <10000 — AB

## 2019-10-13 LAB — ECHOCARDIOGRAM COMPLETE
Height: 71 in
Weight: 3400.38 oz

## 2019-10-13 LAB — COMPREHENSIVE METABOLIC PANEL
ALT: 25 U/L (ref 0–44)
AST: 31 U/L (ref 15–41)
Albumin: 2.9 g/dL — ABNORMAL LOW (ref 3.5–5.0)
Alkaline Phosphatase: 26 U/L — ABNORMAL LOW (ref 38–126)
Anion gap: 11 (ref 5–15)
BUN: 37 mg/dL — ABNORMAL HIGH (ref 8–23)
CO2: 19 mmol/L — ABNORMAL LOW (ref 22–32)
Calcium: 7.3 mg/dL — ABNORMAL LOW (ref 8.9–10.3)
Chloride: 106 mmol/L (ref 98–111)
Creatinine, Ser: 1.19 mg/dL (ref 0.61–1.24)
GFR calc Af Amer: >60 mL/min (ref >60)
GFR calc non Af Amer: 56 mL/min — ABNORMAL LOW (ref >60)
Glucose, Bld: 178 mg/dL — ABNORMAL HIGH (ref 70–99)
Potassium: 3.5 mmol/L (ref 3.5–5.1)
Sodium: 136 mmol/L (ref 135–145)
Total Bilirubin: 1.9 mg/dL — ABNORMAL HIGH (ref 0.3–1.2)
Total Protein: 4.8 g/dL — ABNORMAL LOW (ref 6.5–8.1)

## 2019-10-13 LAB — BLOOD GAS, ARTERIAL
Acid-base deficit: 2.6 mmol/L — ABNORMAL HIGH (ref 0.0–2.0)
Bicarbonate: 20.2 mmol/L (ref 20.0–28.0)
FIO2: 40
O2 Saturation: 97.5 %
Patient temperature: 98.8
pCO2 arterial: 30.2 mmHg — ABNORMAL LOW (ref 32.0–48.0)
pH, Arterial: 7.44 (ref 7.350–7.450)
pO2, Arterial: 107 mmHg (ref 83.0–108.0)

## 2019-10-13 LAB — TROPONIN I (HIGH SENSITIVITY)
Troponin I (High Sensitivity): 67 ng/L — ABNORMAL HIGH (ref <18)
Troponin I (High Sensitivity): 64 ng/L — ABNORMAL HIGH (ref <18)
Troponin I (High Sensitivity): 55 ng/L — ABNORMAL HIGH (ref <18)

## 2019-10-13 LAB — PHOSPHORUS: Phosphorus: 2.8 mg/dL (ref 2.5–4.6)

## 2019-10-13 LAB — TRIGLYCERIDES: Triglycerides: 109 mg/dL (ref <150)

## 2019-10-13 LAB — MAGNESIUM: Magnesium: 3.4 mg/dL — ABNORMAL HIGH (ref 1.7–2.4)

## 2019-10-13 MED ORDER — AMIODARONE HCL IN DEXTROSE 360-4.14 MG/200ML-% IV SOLN
60.0000 mg/h | INTRAVENOUS | Status: AC
  Administered 2019-10-13 (×2): 60 mg/h via INTRAVENOUS
  Filled 2019-10-13: qty 200

## 2019-10-13 MED ORDER — SODIUM CHLORIDE 0.9% FLUSH
10.0000 mL | INTRAVENOUS | Status: DC | PRN
  Administered 2019-10-16 – 2019-11-10 (×3): 10 mL

## 2019-10-13 MED ORDER — AMIODARONE LOAD VIA INFUSION
150.0000 mg | Freq: Once | INTRAVENOUS | Status: AC
  Administered 2019-10-13: 150 mg via INTRAVENOUS
  Filled 2019-10-13: qty 83.34

## 2019-10-13 MED ORDER — SODIUM CHLORIDE 0.9% FLUSH
10.0000 mL | Freq: Two times a day (BID) | INTRAVENOUS | Status: DC
  Administered 2019-10-13: 30 mL
  Administered 2019-10-13 – 2019-10-26 (×25): 10 mL
  Administered 2019-10-27: 20 mL
  Administered 2019-10-27: 10 mL
  Administered 2019-10-28 (×2): 20 mL
  Administered 2019-10-29: 10 mL
  Administered 2019-10-29: 20 mL
  Administered 2019-10-30 – 2019-11-06 (×15): 10 mL
  Administered 2019-11-06: 20 mL
  Administered 2019-11-07 – 2019-11-09 (×4): 10 mL
  Administered 2019-11-10: 20 mL
  Administered 2019-11-10 – 2019-11-12 (×4): 10 mL
  Administered 2019-11-12: 20 mL
  Administered 2019-11-13 – 2019-11-20 (×7): 10 mL
  Administered 2019-11-20: 20 mL
  Administered 2019-11-21 – 2019-11-24 (×7): 10 mL
  Administered 2019-11-25: 30 mL
  Administered 2019-11-25 – 2019-12-01 (×11): 10 mL

## 2019-10-13 MED ORDER — PHENYLEPHRINE HCL-NACL 10-0.9 MG/250ML-% IV SOLN
0.0000 ug/min | INTRAVENOUS | Status: DC
  Administered 2019-10-13 (×2): 20 ug/min via INTRAVENOUS
  Administered 2019-10-13: 60 ug/min via INTRAVENOUS
  Administered 2019-10-13: 100 ug/min via INTRAVENOUS
  Administered 2019-10-13: 70 ug/min via INTRAVENOUS
  Administered 2019-10-13: 30 ug/min via INTRAVENOUS
  Filled 2019-10-13 (×8): qty 250

## 2019-10-13 MED ORDER — SODIUM CHLORIDE 0.9 % IV SOLN: INTRAVENOUS | Status: AC

## 2019-10-13 MED ORDER — TRAVASOL 10 % IV SOLN
INTRAVENOUS | Status: AC
  Filled 2019-10-13: qty 480

## 2019-10-13 MED ORDER — POTASSIUM CHLORIDE 10 MEQ/50ML IV SOLN
10.0000 meq | INTRAVENOUS | Status: AC
  Administered 2019-10-13 (×4): 10 meq via INTRAVENOUS
  Filled 2019-10-13 (×4): qty 50

## 2019-10-13 MED ORDER — AMIODARONE HCL IN DEXTROSE 360-4.14 MG/200ML-% IV SOLN
30.0000 mg/h | INTRAVENOUS | Status: DC
  Administered 2019-10-13 – 2019-10-17 (×10): 30 mg/h via INTRAVENOUS
  Filled 2019-10-13 (×11): qty 200

## 2019-10-13 MED ORDER — PERFLUTREN LIPID MICROSPHERE
1.0000 mL | INTRAVENOUS | Status: AC | PRN
  Administered 2019-10-13: 4 mL via INTRAVENOUS
  Filled 2019-10-13: qty 10

## 2019-10-13 NOTE — Progress Notes (Signed)
[facility] Progress Note Patient Name: Matthew Schwartz DOB: [birth year] MRN: [id number]   Date of Service  10/13/2019  HPI/Events of Note  Hypokalemia - K+ = 3.5 and Creatinine = 1.19. Troponin = 48. Can't heparinize patient with recent surgery and bloody BM post op.   eICU Interventions  Plan: 1. Replace K+.  2. Continue to trend Troponin.      Intervention Category Major Interventions: Electrolyte abnormality - evaluation and management  Alease Fait Eugene 10/13/2019, 4:25 AM

## 2019-10-13 NOTE — Progress Notes (Signed)
Events noted. Patient had surgery for a perforated duodenal ulcer and peritonitis. Post op management per critical care and surgery. [facility] will sign off for now. Call us if needed.

## 2019-10-13 NOTE — Progress Notes (Signed)
Peripherally Inserted Central Catheter Placement  The IV Nurse has discussed with the patient and/or persons authorized to consent for the patient, the purpose of this procedure and the potential benefits and risks involved with this procedure.  The benefits include less needle sticks, lab draws from the catheter, and the patient may be discharged home with the catheter. Risks include, but not limited to, infection, bleeding, blood clot (thrombus formation), and puncture of an artery; nerve damage and irregular heartbeat and possibility to perform a PICC exchange if needed/ordered by physician.  Alternatives to this procedure were also discussed.  Bard Power PICC patient education guide, fact sheet on infection prevention and patient information card has been provided to patient /or left at bedside.    PICC Placement Documentation  PICC Triple Lumen 88/75/79 PICC Left Basilic 54 cm 1 cm (Active)  Indication for Insertion or Continuance of Line Administration of hyperosmolar/irritating solutions (i.e. TPN, Vancomycin, etc.) 10/13/19 1400  Exposed Catheter (cm) 1 cm 10/13/19 1400  Site Assessment Clean;Dry;Intact 10/13/19 1400  Lumen #1 Status Flushed;Blood return noted 10/13/19 1400  Lumen #2 Status Flushed;Blood return noted 10/13/19 1400  Lumen #3 Status Flushed;Blood return noted 10/13/19 1400  Dressing Type Transparent 10/13/19 1400  Dressing Status Clean;Dry;Intact;Antimicrobial disc in place 10/13/19 1400  Dressing Change Due 10/20/19 10/13/19 1400       Jule Economy Horton 10/13/2019, 2:34 PM

## 2019-10-13 NOTE — Progress Notes (Signed)
Around 0200 this morning, this patient began having multiple runs of VT lasting up to 11 beats. MD Oletta Darter was notified and an EKG was obtained along with a CBC, BMP, and a troponin. This RN titrated the Levophed infusion off and the patient is now on a Neo infusion. The patient was also started on an Amiodarone gtt. The patient is now back in NSR. The patient's daughter was called and updated on her father's status. All questions answered. RN will continue to monitor.

## 2019-10-13 NOTE — Progress Notes (Addendum)
[facility] Progress Note Patient Name: Matthew Schwartz DOB: 19-Oct-1932 MRN: [id number]   Date of Service  10/13/2019  HPI/Events of Note  Patient having frequent PVC's. EKG reveals Sinus rhythm with frequent and consecutive Premature ventricular complexes. ST & Marked T wave abnormality, consider anterolateral ischemia Prolonged QT.  eICU Interventions  Plan: 1. Cycle troponin. 2. BMP and Mg++ level STAT.  3. Phenylephrine IV infusion. Titrate to MAP >= 65. 4. Wean Norepinephrine IV infusion off as tolerated.  5. ABG STAT. 6.  Monitor CVP now and Q 4 hours.  7. Amiodarone IV load and infusion.      Intervention Category Major Interventions: Arrhythmia - evaluation and management  Ayannah Faddis [place] 10/13/2019, 2:55 AM

## 2019-10-13 NOTE — Progress Notes (Signed)
NAME:  Matthew Schwartz, MRN:  [id number], DOB:  [birth year], LOS: 1 ADMISSION DATE:  10/12/2019, CONSULTATION DATE:  10/12/2019 REFERRING MD:  Dr. [name], Surgery, CHIEF COMPLAINT:  Abdominal pain  Brief History   86 yo male former smoker presented with nausea/vomiting, abdominal pain and constipation x 4 to 5 days.  Seen twice prior to current admit for the same with imaging studies unremarkable for cause.  On return 6/14, seen by GI and found to have concern for acute abdomen and taken for exploratory laparotomy.  Found to have perforated duodenal ulcer s/p omental patch repair.  Remained on vent post op.  PCCM consulted for critical care management.  Past Medical History  Prostate cancer, HTN, GERD, HLD, DM  Significant Hospital Events   6/14 Admit, exploratory laparotomy, omental patch repair of duodenal ulcer  Consults:  Gastroenterology General surgery  Procedures:  Rt IJ CVL 6/14 >>  Rt radial a line 6/14 >> ETT 6/14 >>  Significant Diagnostic Tests:  CT angio abd/pelvis 6/13 >> mild atherosclerosis, no significant arterial stenosis of mesenteric vasculature, no findings of bowel ischemia, colonic diverticulosis w/o diverticulitis, b/l renal cysts, 12 mm lesion upper pole Rt kidney  Micro Data:  SARS CoV2 6/13 >> negative  Antimicrobials:  Zosyn 6/14 >>  Interim history/subjective:  Glucose range 136-180 Tmax 98.7 / WBC 4.1  UOP 450 ml, +5.8L in last 24 hours  Remains on 40 mcg neosynephrine, amiodarone gtt for frequent PVC's Daughter at bedside.    Objective   Blood pressure (!) 106/48, pulse 78, temperature 98.7 F (37.1 C), temperature source Oral, resp. rate 16, SpO2 99 %. CVP:  [5 mmHg-11 mmHg] 5 mmHg  Vent Mode: PRVC FiO2 (%):  [40 %-50 %] 40 % Set Rate:  [16 bmp] 16 bmp Vt Set:  [580 mL] 580 mL PEEP:  [5 cmH20] 5 cmH20 Plateau Pressure:  [16 cmH20-17 cmH20] 17 cmH20   Intake/Output Summary (Last 24 hours) at 10/13/2019 0900 Last data filed at 10/13/2019  0825 Gross per 24 hour  Intake 7233.56 ml  Output 730 ml  Net 6503.56 ml   There were no vitals filed for this visit.  Examination: General: acutely ill appearing elderly male lying in bed on vent HEENT: MM pink/moist, ETT Neuro: sedate on propofol  CV: s1s2 RRR, SR with occasional PVC's, no m/r/g PULM: non-labored on vent, lungs clear bilaterally  GI: midline dressing c/d/i, JP with clear green/brown fluid Extremities: warm/dry, 1+ BLE edema  Skin: no rashes or lesions  Resolved Hospital Problem list     Assessment & Plan:   Septic shock from peritonitis in setting of perforated duodenal ulcer s/p omental patch repair. -neosynephrine for MAP >65 -continue abx as above  -post-operative care, nutrition per CCS -protonix BID   Acute respiratory failure with hypoxia in setting of sepsis and peritonitis. -PRVC 8cc/kg as rest mode -no plan for extubation until CCS certain no further surgeries  -ok for daily SBT  -follow CXR -PAD protocol with propofol for sedation, may need to change sedation if prolonged vent / TPN needs  -RASS Goal 0 to -1   PVC's / VT  Prolonged QTc Electrolyte deficiencies replaced, on amiodarone. Levophed changed to Neosynephrine.  -continue amiodarone for now -follow electrolytes, ensure K>4, Mg>2 -trend troponin  -assess ECHO -Cardiology consult   DM type II poorly controlled with hyperglycemia. -SSI  -hold home metformin   At Risk Malnutrition  -PICC to be placed 6/15  -plan for TPN per CCS   Hx of  HTN, HLD. -ICU monitoring  -hold home ASA, lipitor, norvasc, imdur, toprol, altace  Best practice:  Diet: NPO DVT prophylaxis: SCDs GI prophylaxis: Protonix Mobility: Bed rest Code Status: full cod Disposition: ICU  Labs   CBC: Recent Labs  Lab 10/09/19 1054 10/11/19 1209 10/12/19 0446 10/12/19 1430 10/13/19 0230  WBC 7.9 10.2 5.0 3.6* 4.1  NEUTROABS  --   --  4.2  --  3.4  HGB 13.7 13.1 14.0 14.1 12.4*  HCT 41.8 39.4 42.0  43.9 37.6*  MCV 79.9* 79.0* 80.5 81.1 80.3  PLT 259 266 231 258 409    Basic Metabolic Panel: Recent Labs  Lab 10/09/19 1234 10/11/19 1209 10/12/19 0320 10/12/19 1430 10/13/19 0230  NA 133* 133* 135 134* 136  K 4.3 3.1* 3.0* 4.6 3.5  CL 98 98 98 102 106  CO2 24 24 23  20* 19*  GLUCOSE 135* 141* 186* 196* 178*  BUN 19 22 25* 34* 37*  CREATININE 0.71 0.69 0.98 1.24 1.19  CALCIUM 8.7* 8.9 8.1* 7.8* 7.3*  MG  --   --   --  2.6* 3.4*  PHOS  --   --   --   --  2.8   GFR: Estimated Creatinine Clearance: 52.4 mL/min (by C-G formula based on SCr of 1.19 mg/dL). Recent Labs  Lab 10/11/19 1209 10/11/19 1355 10/12/19 0446 10/12/19 1430 10/13/19 0230  WBC 10.2  --  5.0 3.6* 4.1  LATICACIDVEN  --  1.7  --   --   --     Liver Function Tests: Recent Labs  Lab 10/09/19 1054 10/11/19 1209 10/12/19 0320 10/13/19 0230  AST 52* 31 32 31  ALT 15 22 22 25   ALKPHOS 55 55 46 26*  BILITOT 1.7* 1.5* 1.6* 1.9*  PROT 7.7 7.0 6.2* 4.8*  ALBUMIN 4.2 3.9 3.4* 2.9*   Recent Labs  Lab 10/09/19 1054 10/11/19 1209 10/12/19 0320  LIPASE 33 36 44   No results for input(s): AMMONIA in the last 168 hours.  ABG    Component Value Date/Time   PHART 7.440 10/13/2019 0311   PCO2ART 30.2 (L) 10/13/2019 0311   PO2ART 107 10/13/2019 0311   HCO3 20.2 10/13/2019 0311   ACIDBASEDEF 2.6 (H) 10/13/2019 0311   O2SAT 97.5 10/13/2019 0311     Coagulation Profile: No results for input(s): INR, PROTIME in the last 168 hours.  Cardiac Enzymes: No results for input(s): CKTOTAL, CKMB, CKMBINDEX, TROPONINI in the last 168 hours.  HbA1C: Hgb A1c MFr Bld  Date/Time Value Ref Range Status  10/12/2019 02:30 PM 6.6 (H) 4.8 - 5.6 % Final    Comment:    (NOTE) Pre diabetes:          5.7%-6.4%  Diabetes:              >6.4%  Glycemic control for   <7.0% adults with diabetes     CBG: Recent Labs  Lab 10/12/19 1546 10/12/19 2029 10/13/19 0036 10/13/19 0436 10/13/19 0759  GLUCAP 214* 181*  178* 157* 136*     Critical care time: 36 minutes   Noe Gens, MSN, NP-C [facility] 10/13/2019, 9:00 AM   Please see [email or web address] for pager details.

## 2019-10-13 NOTE — Progress Notes (Signed)
PHARMACY - TOTAL PARENTERAL NUTRITION CONSULT NOTE   Indication: Prolonged ileus  Patient Measurements:     There is no height or weight on file to calculate BMI. Usual Weight: 90.7 kg   Assessment:  - Pharmacy is consulted to start TPN in 86 yo male with prolonged ileus. Pt underwent exploratory laparotomy on 6/14 which revealed perforated duodenal ulcer.    Glucose / Insulin: hx of DM - on metformin PTA.  - On mSSI q4h after procedure  - 11 units of insulin in past 24 hours  Electrolytes: K+ 3.5 - Pt received 10 meq x 4 this AM, Corr Ca is 8.2. Mg elevated at 3.4, others WNL Renal: Scr 1.19 stable, BUN elevated  LFTs / TGs: LFTs WNL Prealbumin / albumin: albumin 2.9 Intake / Output; MIVF: I/O likely inaccurate for 6/14, NS at 75 ml/hr GI Imaging: Surgeries / Procedures:  - 6/14 exploratory laparotomy on 6/14 which revealed perforated duodenal ulcer.    Central access: Plan to place PICC Line 6/15 TPN start date: 6/15  Nutritional Goals: Pending RD recs  (per RD recommendation on ---): kCal: ----, Protein: ---, Fluid: --- Goal TPN rate is --- mL/hr (provides --- g of protein and -- kcals per day)    Current Nutrition:  NPO   - Pt also on propofol infusion, will adjust lipid in TPN once recommendations available.    Plan:   Start TPN at 40 mL/hr at 1800  Will adjust target protein/dextrose/fats PRN   Electrolytes in TPN: 72mEq/L of Na, 70mEq/L of K, 56mEq/L of Ca, 0 mEq/L of Mg, and 51mmol/L of Phos. Cl:Ac ratio 1:1  Add standard MVI and trace elements to TPN  Continue Moderate q4h SSI and adjust as needed   Reduce MIVF to 35 mL/hr at 1800  Monitor TPN labs on Mon/Thurs,    Royetta Asal, PharmD, BCPS 10/13/2019 11:50 AM

## 2019-10-13 NOTE — Progress Notes (Signed)
Patient had two large liquid blood-tinged bowel movements. Cornett MD notified. No new orders at this time.

## 2019-10-13 NOTE — Progress Notes (Signed)
Arterial Blood Gas drawn and sent to Lab on vent settings of: PRVC, Vt=580, RR=16, FiO2=40%, and PEEP=+5.0

## 2019-10-13 NOTE — Progress Notes (Signed)
  Echocardiogram 2D Echocardiogram has been performed.  Matthew Schwartz 10/13/2019, 5:04 PM

## 2019-10-13 NOTE — Consult Note (Addendum)
Cardiology Consultation:   Patient ID: Matthew Schwartz MRN: [id number]; DOB: Oct 06, 1932  Admit date: 10/12/2019 Date of Consult: 10/13/2019  Primary Care Provider: [name], MD [facility] Cardiologist: Candee Furbish, MD  [facility] Electrophysiologist:  None    Patient Profile:   Matthew Schwartz is a 86 y.o. male with a hx of CAD with PTCA 2000 to RCA, DM, HTN, HLD who is being seen today for the evaluation of ventricular ectopy post op at the request of Dr. Halford Chessman.  History of Present Illness:   Mr. Balestrieri with above hx and last cardiac cath 2000 no records at [facility] with PTCA to RCA. His HTN has been controlled and he is on imdur to prevent angina.    Pt admitted 10/12/19 with abd pain ( pain began on the 11th) and N,V. His sp02 was low  At 86% and oxygen added.  CT of the abdomen and pelvis on 10/09/2019 showed no acute intra-abdominal process and a chronic appearing renal mass. CT angiogram of the abdomen and pelvis performed 10/11/19 showed no evidence of bowel ischemia. He was given abx and morphine and dilaudid.  He was discharged from ER.  He returned on the 14th with continued abd pain, N & V.    Pain progressed and pt underwent emergent expl. Lap.   He was found to have perforated duodenal ulcer and had omental patch repair.   He remained on vent post op.  CCM managing  He + septic shock from peritonitis with perforated duodenal ulcer on pressors   In ER priro to surgery pt was having runs of NSVT, and abnormal EKG.  During the night VT lasting to 11 beats.  IV levophed was changed to Neo.  amiodarone drip was started.   EKG:  The EKG was personally reviewed and demonstrates:  SR with freq PVCs and LVH with new deep T wave inversions lat leads.  All EKGs from 10/11/19 and 6/14/ reviewed and similar but the deep T wave inversions not present 07/22/19. He did have occ PVC present in March.  EKG today 10/13/19 SR with T wave inversion V2-V6 improved but present in ant leads now and coupled  PVCs.  Telemetry:  Telemetry was personally reviewed and demonstrates:  NSVT SR   Hs Troponin 48; 67; 64; 55 Na 136, K+ 3.5, BUN 37, Cr 1.19 Ca+ 7.3,  WBC 4.1, Hgb 12.4, Plts 211  CXR - P stable.   Currently BP 135/46 but down to 97/47 at one point.   IV Propofol , neo and amiodarone.  On Vent sedated  Past Medical History:  Diagnosis Date  . Diabetes mellitus without complication ([place])   . Dyslipidemia   . GERD (gastroesophageal reflux disease)   . Hypertension   . Prostate cancer ([place]) 12/23/13   Adenocarcinoma  . PSA elevation   . S/P radiation therapy 05/18/2014 through 07/16/2014    Prostate 7800 cGy in 40 sessions, seminal vesicles 5600 cGy in 40 sessions     Past Surgical History:  Procedure Laterality Date  . ANGIOPLASTY  2000  . cataract surgery  2000  . COLONOSCOPY    . LAPAROTOMY N/A 10/12/2019   Procedure: EXPLORATORY LAPAROTOMY omental patch repair perforated duodenal ulcer;  Surgeon: Coralie Keens, MD;  Location: [facility] ORS;  Service: General;  Laterality: N/A;  . PROSTATE BIOPSY  12/23/13  . REPLACEMENT TOTAL KNEE  2008/2009   bilateral     Home Medications:  Prior to Admission medications   Medication Sig Start Date End Date  Taking? Authorizing Provider  alfuzosin (UROXATRAL) 10 MG 24 hr tablet Take 10 mg by mouth daily. 09/07/19  Yes [provider]  amLODipine (NORVASC) 5 MG tablet Take 5 mg by mouth daily. 07/13/19  Yes [provider]  aspirin 81 MG tablet Take 81 mg by mouth every morning.    Yes [provider]  atorvastatin (LIPITOR) 40 MG tablet Take 40 mg by mouth at bedtime.    Yes [provider]  dicyclomine (BENTYL) 20 MG tablet Take 1 tablet (20 mg total) by mouth 2 (two) times daily. 10/11/19  Yes Nuala Alpha A, PA-C  famotidine (PEPCID) 20 MG tablet Take 20 mg by mouth 2 (two) times daily.   Yes [provider]    hydrocortisone valerate cream (WESTCORT) 0.2 % Apply 1 application topically 2 (two) times daily.  09/20/19  Yes [provider]  isosorbide mononitrate (IMDUR) 60 MG 24 hr tablet Take 60 mg by mouth every evening.   Yes [provider]  lactulose (CHRONULAC) 10 GM/15ML solution Take 10 g by mouth as needed for mild constipation.   Yes [provider]  metFORMIN (GLUCOPHAGE) 500 MG tablet Take 500 mg by mouth 2 (two) times daily. 09/20/19  Yes [provider]  metoprolol succinate (TOPROL-XL) 50 MG 24 hr tablet Take 50 mg by mouth 2 (two) times daily. 04/07/14  Yes [provider]  potassium chloride (KLOR-CON) 10 MEQ tablet Take 2 tablets (20 mEq total) by mouth daily for 3 days. 10/11/19 10/14/19 Yes Nuala Alpha A, PA-C  PROCTOSOL HC 2.5 % rectal cream Place 1 application rectally daily as needed for hemorrhoids or itching.  11/06/13  Yes [provider]  ramipril (ALTACE) 10 MG capsule Take 10 mg by mouth 2 (two) times daily.    Yes [provider]  RESTASIS 0.05 % ophthalmic emulsion Place 1 drop into both eyes 2 (two) times daily.  09/09/19  Yes [provider]  senna-docusate (SENOKOT-S) 8.6-50 MG tablet Take 1 tablet by mouth daily. 10/09/19  Yes Lucrezia Starch, MD  [facility] injection Inject as directed once. 04/10/17   [provider]    Inpatient Medications: Scheduled Meds: . chlorhexidine gluconate (MEDLINE KIT)  15 mL Mouth Rinse BID  . Chlorhexidine Gluconate Cloth  6 each Topical Daily  . insulin aspart  0-15 Units Subcutaneous Q4H  . mouth rinse  15 mL Mouth Rinse 10 times per day  . pantoprazole (PROTONIX) IV  40 mg Intravenous Q12H   Continuous Infusions: . sodium chloride 75 mL/hr at 10/13/19 0926  . amiodarone 30 mg/hr (10/13/19 0926)  . phenylephrine (NEO-SYNEPHRINE) Adult infusion 60 mcg/min (10/13/19 1046)  . piperacillin-tazobactam (ZOSYN)  IV Stopped (10/13/19 5284)  . propofol  (DIPRIVAN) infusion 20 mcg/kg/min (10/13/19 0926)   PRN Meds: acetaminophen **OR** acetaminophen, fentaNYL (SUBLIMAZE) injection, midazolam, ondansetron (ZOFRAN) IV  Allergies:    Allergies  Allergen Reactions  . Losartan Potassium Other (See Comments)    Unknown reaction that happened 15 years ago    Social History:   Social History   Socioeconomic History  . Marital status: Married    Spouse name: Not on file  . Number of children: Not on file  . Years of education: Not on file  . Highest education level: Not on file  Occupational History  . Occupation: retired  Tobacco Use  . Smoking status: Former Smoker    Quit date: 06/14/1994    Years since quitting: 25.3  . Smokeless tobacco: Never Used  Vaping Use  . Vaping Use: Never used  Substance and Sexual Activity  . Alcohol use: No  . Drug use: No  . Sexual activity: Not on file  Other Topics Concern  . Not on file  Social History Narrative  . Not on file   Social Determinants of Health   Financial Resource Strain:   . Difficulty of Paying Living Expenses:   Food Insecurity:   . Worried About Charity fundraiser in the Last Year:   . Arboriculturist in the Last Year:   Transportation Needs:   . Film/video editor (Medical):   Marland Kitchen Lack of Transportation (Non-Medical):   Physical Activity:   . Days of Exercise per Week:   . Minutes of Exercise per Session:   Stress:   . Feeling of Stress :   Social Connections:   . Frequency of Communication with Friends and Family:   . Frequency of Social Gatherings with Friends and Family:   . Attends Religious Services:   . Active Member of Clubs or Organizations:   . Attends Archivist Meetings:   Marland Kitchen Marital Status:   Intimate Partner Violence:   . Fear of Current or Ex-Partner:   . Emotionally Abused:   Marland Kitchen Physically Abused:   . Sexually Abused:     Family History:    Family History  Problem Relation Age of Onset  . CVA Mother   . Heart disease Sister    . Coronary artery disease Sister   . Heart disease Brother   . Coronary artery disease Brother   . Heart disease Brother   . Coronary artery disease Brother      ROS:  Please see the history of present illness.  General:no colds or fevers, no weight changes Skin:no rashes or ulcers HEENT:no blurred vision, no congestion CV:see HPI PUL:see HPI GI:no diarrhea constipation or melena, no indigestion GU:no hematuria, no dysuria, hx prostate cancer MS:no joint pain, no claudication Neuro:no syncope, no lightheadedness Endo:+ diabetes, no thyroid disease  All other ROS reviewed and negative.     Physical Exam/Data:   Vitals:   10/13/19 0741 10/13/19 0800 10/13/19 0900 10/13/19 1109  BP: (!) 104/41 (!) 106/48 (!) 121/46 (!) 135/46  Pulse: 78   78  Resp: _0 Temp:  98.7 F (37.1 C)    TempSrc:  Oral    SpO2: 96% 99%  94%    Intake/Output Summary (Last 24 hours) at 10/13/2019 1121 Last data filed at 10/13/2019 0926 Gross per 24 hour  Intake 7642.31 ml  Output 730 ml  Net 6912.31 ml   Last 3 Weights 10/09/2019 07/22/2019 01/14/2019  Weight (lbs) 200 lb 206 lb 207 lb 12.8 oz  Weight (kg) 90.719 kg 93.441 kg 94.257 kg     There is no height or weight on file to calculate BMI.  General:  Thin male, in no acute distress sedated on vent  HEENT: normal Lymph: no adenopathy Neck: no JVD Endocrine:  No thryomegaly Vascular: No carotid bruits; pedal pulses 1+ bilaterally  Cardiac:  normal S1, S2; RRR; no murmur gallup rub or click Lungs:  clear to auscultation bilaterally-ant position, no wheezing, rhonchi or rales  Abd: dressing in place  no hepatomegaly  Ext: no edema Musculoskeletal:  No deformities, pt sedated Skin: warm to cool and dry  Neuro:  sedated, on vent Psych:  Flat due to dedationaffect    Relevant CV Studies: No recent.   Laboratory Data:  High Sensitivity Troponin:   Recent Labs  Lab 10/12/19 0446 10/13/19 0230 10/13/19 0459 10/13/19 1014    TROPONINIHS 48* 67* 64* 55*     Chemistry Recent Labs  Lab 10/12/19 0320 10/12/19 1430 10/13/19 0230  NA 135 134* 136  K 3.0* 4.6 3.5  CL 98 102 106  CO2 23 20* 19*  GLUCOSE 186* 196* 178*  BUN 25* 34* 37*  CREATININE 0.98 1.24 1.19  CALCIUM 8.1* 7.8* 7.3*  GFRNONAA >60 53* 56*  GFRAA >60 >60 >60  ANIONGAP _0 Recent Labs  Lab 10/11/19 1209 10/12/19 0320 10/13/19 0230  PROT 7.0 6.2* 4.8*  ALBUMIN 3.9 3.4* 2.9*  AST 31 32 31  ALT _1 ALKPHOS 55 46 26*  BILITOT 1.5* 1.6* 1.9*   Hematology Recent Labs  Lab 10/12/19 0446 10/12/19 1430 10/13/19 0230  WBC 5.0 3.6* 4.1  RBC 5.22 5.41 4.68  HGB 14.0 14.1 12.4*  HCT 42.0 43.9 37.6*  MCV 80.5 81.1 80.3  MCH 26.8 26.1 26.5  MCHC 33.3 32.1 33.0  RDW 17.2* 17.6* 17.2*  PLT 231 258 211   BNPNo results for input(s): BNP, PROBNP in the last 168 hours.  DDimer No results for input(s): DDIMER in the last 168 hours.   Radiology/Studies:  CT ABDOMEN PELVIS W CONTRAST  Result Date: 10/09/2019 CLINICAL DATA:  Abdominal pain, constipation, and emesis. History of prostate cancer. EXAM: CT ABDOMEN AND PELVIS WITH CONTRAST TECHNIQUE: Multidetector CT imaging of the abdomen and pelvis was performed using the standard protocol following bolus administration of intravenous contrast. CONTRAST:  169m OMNIPAQUE IOHEXOL 300 MG/ML  SOLN COMPARISON:  CT abdomen pelvis dated April 16, 2018. FINDINGS: Lower chest: No acute abnormality. Unchanged calcified granulomas in both lower lobes and small subpleural nodules in the right middle lobe. Hepatobiliary: No focal liver abnormality is seen. No gallstones or gallbladder wall thickening. Unchanged mild intra- and extrahepatic biliary the prominence. Pancreas: Unchanged main pancreatic duct measuring up to 5 mm in diameter in the head of the pancreas. No surrounding inflammatory changes. Spleen: Normal in size without focal abnormality. Adrenals/Urinary Tract: Unchanged small  bilateral renal cysts. Unchanged 12 mm enhancing lesion in the upper pole of the right knee (series 2, image 5). No renal calculi or hydronephrosis. The bladder is unremarkable. Stomach/Bowel: Stomach is within normal limits. Appendix appears normal. No evidence of bowel wall thickening, distention, or inflammatory changes. Mild left-sided colonic diverticulosis. Vascular/Lymphatic: Aortic atherosclerosis. No enlarged abdominal or pelvic lymph nodes. Reproductive: Unchanged fiduciary markers in the prostate. Other: No abdominal wall hernia or abnormality. No abdominopelvic ascites. No pneumoperitoneum. Musculoskeletal: No acute or significant osseous findings. IMPRESSION: 1. No acute intra-abdominal process. 2. Unchanged 12 mm enhancing lesion in the upper pole of the right kidney, stable since 2015, likely a low grade renal neoplasm. 3. Aortic Atherosclerosis (ICD10-I70.0). Electronically Signed   By: WTitus DubinM.D.   On: 10/09/2019 14:17   DG Chest Port 1 View  Result Date: 10/13/2019 CLINICAL DATA:  Respiratory failure EXAM: PORTABLE CHEST 1 VIEW COMPARISON:  Yesterday FINDINGS: Endotracheal tube with tip at the clavicular heads. The enteric tube at least reaches the diaphragm. Right IJ line with tip at the upper cavoatrial junction. Low volume chest with increased atelectasis. No visible effusion or pneumothorax. Very distorted heart size due to rotation. IMPRESSION: 1. Unremarkable hardware positioning. 2. Increased atelectasis. Electronically Signed   By: JMonte FantasiaM.D.   On: 10/13/2019 05:11   DG CHEST PORT  1 VIEW  Result Date: 10/12/2019 CLINICAL DATA:  Status post endotracheal tube placement. EXAM: PORTABLE CHEST 1 VIEW COMPARISON:  December 09, 2006. FINDINGS: The heart size and mediastinal contours are within normal limits. Endotracheal tube appears to be in grossly good position. Right internal jugular catheter is noted with distal tip in expected position of cavoatrial junction.  Nasogastric tube is seen entering stomach. No pneumothorax is noted. Left lung is clear. Mild right basilar subsegmental atelectasis is noted with probable small right pleural effusion. The visualized skeletal structures are unremarkable. IMPRESSION: Endotracheal tube in grossly good position. Mild right basilar subsegmental atelectasis is noted with probable small right pleural effusion. No pneumothorax is noted. Electronically Signed   By: Marijo Conception M.D.   On: 10/12/2019 19:20   [place] EKG SITE RITE  Result Date: 10/13/2019 If Site Rite image not attached, placement could not be confirmed due to current cardiac rhythm.  CT Angio Abd/Pel W and/or Wo Contrast  Result Date: 10/11/2019 CLINICAL DATA:  Persistent abdominal pain. Clinical concern for ischemia. EXAM: CTA ABDOMEN AND PELVIS WITHOUT AND WITH CONTRAST TECHNIQUE: Multidetector CT imaging of the abdomen and pelvis was performed using the standard protocol during bolus administration of intravenous contrast. Multiplanar reconstructed images and MIPs were obtained and reviewed to evaluate the vascular anatomy. CONTRAST:  154m OMNIPAQUE IOHEXOL 350 MG/ML SOLN COMPARISON:  Contrast-enhanced CT 2 days ago. FINDINGS: Place not patient motion artifact limits assessment. VASCULAR Aorta: Atherosclerosis and tortuosity, mild-to-moderate. No aneurysm, dissection, or significant stenosis. No evidence of vasculitis. Celiac: Mild plaque at the origin but no significant stenosis. No dissection or acute findings. Branch vessels are patent, partially obscured by motion. SMA: Mild plaque in the SMA without significant stenosis. No dissection or vasculitis. Replaced right hepatic artery arising from the SMA. Mesenteric branch vessels are patent, allowing for motion limitations. Renals: Minor plaque at the origins without significant stenosis. No dissection or vasculitis. Single bilateral renal arteries. IMA: Patent without significant stenosis. Inflow: Mild  atherosclerosis. No aneurysm, significant stenosis, dissection or vasculitis. Proximal Outflow: Bilateral common femoral and visualized portions of the superficial and profunda femoral arteries are patent without evidence of aneurysm, dissection, vasculitis or significant stenosis. Veins: Venous phase imaging demonstrates patency of the portal and superior mesenteric veins. No evidence of mesenteric venous thrombosis. IVC is unremarkable. Review of the MIP images confirms the above findings. NON-VASCULAR Lower chest: No acute findings. Tiny subpleural nodule in the right lower lobe is unchanged. Calcified granuloma at the lung bases. Hepatobiliary: No focal hepatic lesion. Minimal focal fatty infiltration adjacent to the falciform ligament. Physiologically distended gallbladder without calcified gallstone. Stable mild biliary dilatation with common bile duct measuring 12 mm, unchanged. Pancreas: Stable proximal pancreatic ductal prominence 5 mm. No peripancreatic inflammation. No evidence of pancreatic mass. Spleen: Normal in size without focal abnormality. Adrenals/Urinary Tract: No adrenal nodule. No hydronephrosis. Multiple bilateral low-density lesions in both kidneys, incompletely characterized. Possibly enhancing right upper pole 12 mm lesion, series 11, image 57, unchanged over the past 2 days. Urinary bladder is unremarkable. Stomach/Bowel: Stomach partially distended. Equivocal pre pyloric gastric wall thickening versus peristalsis. No small bowel obstruction or inflammatory change. Scattered intraluminal fluid within pelvic small bowel, nonspecific. Normal appendix. Moderate volume of formed stool in the ascending and transverse colon. Small volume of formed stool in the descending colon. Scattered descending and sigmoid colonic diverticulosis without diverticulitis. Sigmoid colon is redundant. There is minimal liquid stool in the distal sigmoid colon and rectum. No colonic wall thickening or pericolonic  edema, particularly at the splenic flexure. No findings to suggest bowel ischemia. No pneumatosis. Lymphatic: No adenopathy. Reproductive: No acute findings.  Stable markers in the prostate. Other: No free air, free fluid, or intra-abdominal fluid collection. Musculoskeletal: There are no acute or suspicious osseous abnormalities. IMPRESSION: VASCULAR. 1. Mild aortic and branch atherosclerosis. No acute vascular findings. 2. No significant arterial stenosis of the mesenteric vasculature. Mesenteric vessels are widely patent. NON-VASCULAR. 1. No findings to suggest bowel ischemia. 2. Colonic diverticulosis without diverticulitis. Moderate stool in the proximal colon. 3. Equivocal pre pyloric gastric wall thickening versus peristalsis. 4. Bilateral renal cysts. Possibly enhancing 12 mm lesion upper pole right kidney is unchanged, possibly located renal neoplasm. 5. Stable biliary dilatation. Aortic Atherosclerosis (ICD10-I70.0). Electronically Signed   By: Keith Rake M.D.   On: 10/11/2019 15:55      NO CHEST PAIN noted all abd pain.  Assessment and Plan:   1. NSVT in acute setting with stable K+ and Mg+. Now on amiodarone and improved - continue amiodarone. 2. abnormal EKG on presentation with deep T wave inversions - troponins mildly elevated -  May be demand ischemia but agree with Echo ordered to eval LV function - may need ischemic work up in future depending on echo and symptoms once awake. 3. CAD with hx of PTCA alone in 2000 in [facility] I do not have record.  4. Sepsis due to peritonitis with hypotension on neo now  5. Acute respiratory failure post op vent support per CCM sedated 6. Acute ABD with perforated duodenal ulcer and emergent exploratory lap and s/p omental patch repair.       For questions or updates, please contact [place] Please consult [email or web address] for contact info under    Signed, Cecilie Kicks, NP  10/13/2019 11:21 AM   Patient seen and examined with Cecilie Kicks  NP.  Agree as above, with the following exceptions and changes as noted below. This is an 86 year old male who is a former smoker who presented with nausea and vomiting, abdominal pain and constipation 4 to 5 days, seen serially in the ED over the weekend on second presentation to the ER seen by GI and found to have an acute abdomen, taken for exploratory laparotomy.  Found to have a perforated duodenal ulcer status post omental patch repair.  He remains on the ventilator postoperatively.  Cardiology consulted for increased nonsustained ventricular tachycardia on telemetry overnight.  He is on amiodarone, and remains on blood pressure support on phenylephrine.  Concern for peritonitis and septic shock in the setting of the perforated duodenal ulcer.  His presenting ECG on 10/11/2019 has concerning deep precordial T wave inversions.  At that time, troponin was mildly elevated and potassium was noted to be 3.1.  He does have a history of ischemia and PTCA to the RCA.  His daughter believes he may have had a stent, history is unclear. Exam General: Intubated and sedated, cardiovascular regular rhythm normal rate.  Ventilator breath sounds obscured cardiac auscultation but no significant murmur noted.  Lungs: Ventilator breath sounds, clear anteriorly abdomen: Distended but not tense neuro/psych: Intubated and sedated unable to assess. All available labs, radiology testing, previous records reviewed.  Mr. Phillips has a history of coronary artery disease and concerning ECG changes.  He is having increasing ventricular ectopy in the setting of electrolyte abnormalities, postoperative status, and septic shock.  Likely multifactorial nonsustained ventricular tachycardia.  With ECG changes cannot exclude ischemia.  To risk stratify in the acute setting, will obtain  an echocardiogram.  Agree with continuing amiodarone.  Elouise Munroe, MD 10/13/19 4:18 PM

## 2019-10-13 NOTE — Progress Notes (Addendum)
Initial Nutrition Assessment  DOCUMENTATION CODES:   Not applicable  INTERVENTION:   Monitor magnesium, potassium, and phosphorus daily for at least 3 days, MD to replete as needed, as pt is at risk for refeeding syndrome.  TPN per pharmacy  NUTRITION DIAGNOSIS:   Inadequate oral intake related to inability to eat as evidenced by NPO status.    GOAL:   Patient will meet greater than or equal to 90% of their needs    MONITOR:   Weight trends, Labs, I & O's, Vent status  REASON FOR ASSESSMENT:   Consult New TPN/TNA  ASSESSMENT:   Pt admitted with obstipation. PMH includes prostate cancer s/p radiation, HTN, HLD, GERD, T2DM.  6/14 pt intubated; NGT tube (gastric tip confirmed via xray); s/p exploratory laparotomy, omental patch repair of perforated duodenal ulcer  Discussed pt with RN.   Per H&P, pt had been complaining of severe abdominal pain, N/V, and constipation for several days. Pt's daughter in room (does not live with pt) and is unable to provide specific information regarding pt's home diet, but states that he does eat 3 meals/day typically and follows a vegetarian diet. Although pt does have mild-moderate depletions upon examination, unable to diagnose malnutrition at this time without more information regarding diet and weight history; cause of muscle/fat depletions cannot be conclusively determined at this time.   UOP: 461ml x24 hours JP Drain: 265ml output x24 hours I/O: +1,037.32ml since admit  Labs: Corrected Ca 8.18 (L), K+ 3.4 (H) CBGs [phone] Medications: Novolog IVF: NS @ 52ml/hr Drips: Amiodarone, Neosynephrine, Propofol  Patient is currently intubated on ventilator support MV: 9.2 L/min Temp (24hrs), Avg:97.6 F (36.4 C), Min:95.1 F (35.1 C), Max:98.8 F (37.1 C)  Propofol: 10.88 ml/hr, providing 287 kcals at current rate   NUTRITION - FOCUSED PHYSICAL EXAM:    Most Recent Value  Orbital Region Mild depletion  Upper Arm Region  Mild depletion  Thoracic and Lumbar Region No depletion  Buccal Region Unable to assess  Temple Region Moderate depletion  Clavicle Bone Region Mild depletion  Clavicle and Acromion Bone Region Mild depletion  Scapular Bone Region Moderate depletion  Dorsal Hand No depletion  Patellar Region No depletion  Anterior Thigh Region No depletion  Posterior Calf Region No depletion  Edema (RD Assessment) Mild  Hair Reviewed  Eyes Reviewed  Mouth Reviewed  Skin Reviewed  Nails Reviewed       Diet Order:   Diet Order            Diet NPO time specified  Diet effective now                 EDUCATION NEEDS:   Not appropriate for education at this time  Skin:  Skin Assessment: Skin Integrity Issues: Skin Integrity Issues:: Incisions Incisions: abdomen  Last BM:  6/14 type 7  Height:   Ht Readings from Last 1 Encounters:  10/13/19 5\' 11"  (1.803 m)    Weight:   Wt Readings from Last 10 Encounters:  10/13/19 96.4 kg  10/09/19 90.7 kg  07/22/19 93.4 kg  01/14/19 94.3 kg  04/28/18 97.2 kg  04/16/18 97.6 kg  10/18/17 97.8 kg  06/21/17 97 kg  04/19/17 97.4 kg  03/15/17 99.1 kg    Ideal Body Weight:  75.45 kg  BMI:  Body mass index is 29.64 kg/m.  Estimated Nutritional Needs:   Kcal:  2200-2400  Protein:  115-125 grams  Fluid:  >2L/d    Larkin Ina, MS, RD, LDN RD  pager number and weekend/on-call pager number located in [place].

## 2019-10-13 NOTE — Progress Notes (Addendum)
1 Day Post-Op  Subjective: CC: Sedated on vent. Bloody BM overnight. Had episode of VT. Now on amio drip. Requiring some neo. Bloody output in NGT. Bile in drain.   Objective: Vital signs in last 24 hours: Temp:  [95.1 F (35.1 C)-98.8 F (37.1 C)] 98.8 F (37.1 C) (06/15 0400) Pulse Rate:  [41-125] 78 (06/15 0741) Resp:  [14-34] 16 (06/15 0800) BP: (68-143)/(25-109) 106/48 (06/15 0800) SpO2:  [94 %-100 %] 99 % (06/15 0800) Arterial Line BP: (93-154)/(34-57) 127/48 (06/15 0815) FiO2 (%):  [40 %-50 %] 40 % (06/15 0800)    Intake/Output from previous day: 06/14 0701 - 06/15 0700 In: 6604.7 [I.V.:5764.2; IV Piggyback:840.5] Out: 730 [Urine:450; Drains:230; Blood:50] Intake/Output this shift: Total I/O In: 628.8 [I.V.:485.5; IV Piggyback:143.3] Out: -   Vent Mode: PRVC FiO2 (%):  [40 %-50 %] 40 % Set Rate:  [16 bmp] 16 bmp Vt Set:  [580 mL] 580 mL PEEP:  [5 cmH20] 5 cmH20 Plateau Pressure:  [16 cmH20-17 cmH20] 17 cmH20  PE: Gen:  Sedated on vent  Card:  Reg rate Pulm:  CTA b/l. On vent with setting as above Abd: Soft, mild distension, no rigidity, hypoactive bowel sounds. Drain in place with bile in drain. Midline wound as noted below. Slick fatty tissue with some granulation tissue. No dehiscence.  Ext:  No LE edema      Lab Results:  Recent Labs    10/12/19 1430 10/13/19 0230  WBC 3.6* 4.1  HGB 14.1 12.4*  HCT 43.9 37.6*  PLT 258 211   BMET Recent Labs    10/12/19 1430 10/13/19 0230  NA 134* 136  K 4.6 3.5  CL 102 106  CO2 20* 19*  GLUCOSE 196* 178*  BUN 34* 37*  CREATININE 1.24 1.19  CALCIUM 7.8* 7.3*   PT/INR No results for input(s): LABPROT, INR in the last 72 hours. CMP     Component Value Date/Time   NA 136 10/13/2019 0230   NA 137 11/02/2016 1509   K 3.5 10/13/2019 0230   K 4.4 11/02/2016 1509   CL 106 10/13/2019 0230   CO2 19 (L) 10/13/2019 0230   CO2 27 11/02/2016 1509   GLUCOSE 178 (H) 10/13/2019 0230   GLUCOSE 153 (H)  11/02/2016 1509   BUN 37 (H) 10/13/2019 0230   BUN 16.7 11/02/2016 1509   CREATININE 1.19 10/13/2019 0230   CREATININE 0.9 11/02/2016 1509   CALCIUM 7.3 (L) 10/13/2019 0230   CALCIUM 9.1 11/02/2016 1509   PROT 4.8 (L) 10/13/2019 0230   PROT 7.0 11/02/2016 1509   ALBUMIN 2.9 (L) 10/13/2019 0230   ALBUMIN 3.6 11/02/2016 1509   AST 31 10/13/2019 0230   AST 15 11/02/2016 1509   ALT 25 10/13/2019 0230   ALT 12 11/02/2016 1509   ALKPHOS 26 (L) 10/13/2019 0230   ALKPHOS 58 11/02/2016 1509   BILITOT 1.9 (H) 10/13/2019 0230   BILITOT 0.71 11/02/2016 1509   GFRNONAA 56 (L) 10/13/2019 0230   GFRAA >60 10/13/2019 0230   Lipase     Component Value Date/Time   LIPASE 44 10/12/2019 0320       Studies/Results: DG Chest Port 1 View  Result Date: 10/13/2019 CLINICAL DATA:  Respiratory failure EXAM: PORTABLE CHEST 1 VIEW COMPARISON:  Yesterday FINDINGS: Endotracheal tube with tip at the clavicular heads. The enteric tube at least reaches the diaphragm. Right IJ line with tip at the upper cavoatrial junction. Low volume chest with increased atelectasis. No visible effusion or pneumothorax.  Very distorted heart size due to rotation. IMPRESSION: 1. Unremarkable hardware positioning. 2. Increased atelectasis. Electronically Signed   By: Monte Fantasia M.D.   On: 10/13/2019 05:11   DG CHEST PORT 1 VIEW  Result Date: 10/12/2019 CLINICAL DATA:  Status post endotracheal tube placement. EXAM: PORTABLE CHEST 1 VIEW COMPARISON:  December 09, 2006. FINDINGS: The heart size and mediastinal contours are within normal limits. Endotracheal tube appears to be in grossly good position. Right internal jugular catheter is noted with distal tip in expected position of cavoatrial junction. Nasogastric tube is seen entering stomach. No pneumothorax is noted. Left lung is clear. Mild right basilar subsegmental atelectasis is noted with probable small right pleural effusion. The visualized skeletal structures are  unremarkable. IMPRESSION: Endotracheal tube in grossly good position. Mild right basilar subsegmental atelectasis is noted with probable small right pleural effusion. No pneumothorax is noted. Electronically Signed   By: Marijo Conception M.D.   On: 10/12/2019 19:20   CT Angio Abd/Pel W and/or Wo Contrast  Result Date: 10/11/2019 CLINICAL DATA:  Persistent abdominal pain. Clinical concern for ischemia. EXAM: CTA ABDOMEN AND PELVIS WITHOUT AND WITH CONTRAST TECHNIQUE: Multidetector CT imaging of the abdomen and pelvis was performed using the standard protocol during bolus administration of intravenous contrast. Multiplanar reconstructed images and MIPs were obtained and reviewed to evaluate the vascular anatomy. CONTRAST:  166mL OMNIPAQUE IOHEXOL 350 MG/ML SOLN COMPARISON:  Contrast-enhanced CT 2 days ago. FINDINGS: Place not patient motion artifact limits assessment. VASCULAR Aorta: Atherosclerosis and tortuosity, mild-to-moderate. No aneurysm, dissection, or significant stenosis. No evidence of vasculitis. Celiac: Mild plaque at the origin but no significant stenosis. No dissection or acute findings. Branch vessels are patent, partially obscured by motion. SMA: Mild plaque in the SMA without significant stenosis. No dissection or vasculitis. Replaced right hepatic artery arising from the SMA. Mesenteric branch vessels are patent, allowing for motion limitations. Renals: Minor plaque at the origins without significant stenosis. No dissection or vasculitis. Single bilateral renal arteries. IMA: Patent without significant stenosis. Inflow: Mild atherosclerosis. No aneurysm, significant stenosis, dissection or vasculitis. Proximal Outflow: Bilateral common femoral and visualized portions of the superficial and profunda femoral arteries are patent without evidence of aneurysm, dissection, vasculitis or significant stenosis. Veins: Venous phase imaging demonstrates patency of the portal and superior mesenteric veins.  No evidence of mesenteric venous thrombosis. IVC is unremarkable. Review of the MIP images confirms the above findings. NON-VASCULAR Lower chest: No acute findings. Tiny subpleural nodule in the right lower lobe is unchanged. Calcified granuloma at the lung bases. Hepatobiliary: No focal hepatic lesion. Minimal focal fatty infiltration adjacent to the falciform ligament. Physiologically distended gallbladder without calcified gallstone. Stable mild biliary dilatation with common bile duct measuring 12 mm, unchanged. Pancreas: Stable proximal pancreatic ductal prominence 5 mm. No peripancreatic inflammation. No evidence of pancreatic mass. Spleen: Normal in size without focal abnormality. Adrenals/Urinary Tract: No adrenal nodule. No hydronephrosis. Multiple bilateral low-density lesions in both kidneys, incompletely characterized. Possibly enhancing right upper pole 12 mm lesion, series 11, image 57, unchanged over the past 2 days. Urinary bladder is unremarkable. Stomach/Bowel: Stomach partially distended. Equivocal pre pyloric gastric wall thickening versus peristalsis. No small bowel obstruction or inflammatory change. Scattered intraluminal fluid within pelvic small bowel, nonspecific. Normal appendix. Moderate volume of formed stool in the ascending and transverse colon. Small volume of formed stool in the descending colon. Scattered descending and sigmoid colonic diverticulosis without diverticulitis. Sigmoid colon is redundant. There is minimal liquid stool in the distal sigmoid  colon and rectum. No colonic wall thickening or pericolonic edema, particularly at the splenic flexure. No findings to suggest bowel ischemia. No pneumatosis. Lymphatic: No adenopathy. Reproductive: No acute findings.  Stable markers in the prostate. Other: No free air, free fluid, or intra-abdominal fluid collection. Musculoskeletal: There are no acute or suspicious osseous abnormalities. IMPRESSION: VASCULAR. 1. Mild aortic and  branch atherosclerosis. No acute vascular findings. 2. No significant arterial stenosis of the mesenteric vasculature. Mesenteric vessels are widely patent. NON-VASCULAR. 1. No findings to suggest bowel ischemia. 2. Colonic diverticulosis without diverticulitis. Moderate stool in the proximal colon. 3. Equivocal pre pyloric gastric wall thickening versus peristalsis. 4. Bilateral renal cysts. Possibly enhancing 12 mm lesion upper pole right kidney is unchanged, possibly located renal neoplasm. 5. Stable biliary dilatation. Aortic Atherosclerosis (ICD10-I70.0). Electronically Signed   By: Keith Rake M.D.   On: 10/11/2019 15:55    Anti-infectives: Anti-infectives (From admission, onward)   Start     Dose/Rate Route Frequency Ordered Stop   10/12/19 2000  piperacillin-tazobactam (ZOSYN) IVPB 3.375 g     Discontinue     3.375 g 12.5 mL/hr over 240 Minutes Intravenous Every 8 hours 10/12/19 1911     10/12/19 1708  ceFAZolin (ANCEF) 2-4 GM/100ML-% IVPB       Note to Pharmacy: Bennye Alm   : cabinet override      10/12/19 1708 10/12/19 1849       Assessment/Plan Remote hx of prostate cancer s/p radiation HTN HLD GERD DM2 VDRF VT overnight on amio drip Hyperglycemia - SSI Hypokalemia  ABL anemia - bloody bm overnight and bloody output in NGT. Hold chemical prophylaxis   Perforated Duodenal Ulcer S/p Exploratory laparotomy, omental patch repair of duodenal ulcer - Dr. [name] - 10/12/2019 - POD #1 - Maintain JP drain. Bile in drain. Will discuss with MD plan being so close out from surgery - Maintain NGT. Will start TPN - We will follow along closely. Appreciate CCM assistance   FEN - NPO, NGT, IVF, TPN VTE - SCDs ID - Zosyn Foley - in place Follow-Up - Dr. [name]   LOS: 1 day    Jillyn Ledger , [facility] 10/13/2019, 8:28 AM Please see Amion for pager number during day hours 7:00am-4:30pm    I have seen and examined the patient and agree with  the assessment and plans.  Will follow drain output.  He had so much gastric contents and bile in his abdomen than this may be residual coming out in the drain. Continue NPO and NG  Gita Dilger A. [name]  MD, FACS

## 2019-10-14 ENCOUNTER — Inpatient Hospital Stay (HOSPITAL_COMMUNITY): Payer: Medicare Other

## 2019-10-14 DIAGNOSIS — R778 Other specified abnormalities of plasma proteins: Secondary | ICD-10-CM

## 2019-10-14 DIAGNOSIS — I493 Ventricular premature depolarization: Secondary | ICD-10-CM

## 2019-10-14 DIAGNOSIS — J9601 Acute respiratory failure with hypoxia: Secondary | ICD-10-CM

## 2019-10-14 DIAGNOSIS — R0689 Other abnormalities of breathing: Secondary | ICD-10-CM

## 2019-10-14 LAB — BASIC METABOLIC PANEL
Anion gap: 6 (ref 5–15)
BUN: 43 mg/dL — ABNORMAL HIGH (ref 8–23)
CO2: 21 mmol/L — ABNORMAL LOW (ref 22–32)
Calcium: 6.9 mg/dL — ABNORMAL LOW (ref 8.9–10.3)
Chloride: 109 mmol/L (ref 98–111)
Creatinine, Ser: 1.15 mg/dL (ref 0.61–1.24)
GFR calc Af Amer: >60 mL/min (ref >60)
GFR calc non Af Amer: 58 mL/min — ABNORMAL LOW (ref >60)
Glucose, Bld: 359 mg/dL — ABNORMAL HIGH (ref 70–99)
Potassium: 4.7 mmol/L (ref 3.5–5.1)
Sodium: 136 mmol/L (ref 135–145)

## 2019-10-14 LAB — GLUCOSE, CAPILLARY
Glucose-Capillary: 143 mg/dL — ABNORMAL HIGH (ref 70–99)
Glucose-Capillary: 159 mg/dL — ABNORMAL HIGH (ref 70–99)
Glucose-Capillary: 155 mg/dL — ABNORMAL HIGH (ref 70–99)
Glucose-Capillary: 144 mg/dL — ABNORMAL HIGH (ref 70–99)
Glucose-Capillary: 161 mg/dL — ABNORMAL HIGH (ref 70–99)
Glucose-Capillary: 146 mg/dL — ABNORMAL HIGH (ref 70–99)

## 2019-10-14 LAB — CBC WITH DIFFERENTIAL/PLATELET
Abs Immature Granulocytes: 0.05 10*3/uL (ref 0.00–0.07)
Basophils Absolute: 0 10*3/uL (ref 0.0–0.1)
Basophils Relative: 1 %
Eosinophils Absolute: 0 10*3/uL (ref 0.0–0.5)
Eosinophils Relative: 0 %
HCT: 31.3 % — ABNORMAL LOW (ref 39.0–52.0)
Hemoglobin: 10.4 g/dL — ABNORMAL LOW (ref 13.0–17.0)
Immature Granulocytes: 1 %
Lymphocytes Relative: 5 %
Lymphs Abs: 0.3 10*3/uL — ABNORMAL LOW (ref 0.7–4.0)
MCH: 26.7 pg (ref 26.0–34.0)
MCHC: 33.2 g/dL (ref 30.0–36.0)
MCV: 80.3 fL (ref 80.0–100.0)
Monocytes Absolute: 0.3 10*3/uL (ref 0.1–1.0)
Monocytes Relative: 5 %
Neutro Abs: 6.3 10*3/uL (ref 1.7–7.7)
Neutrophils Relative %: 88 %
Platelets: 161 10*3/uL (ref 150–400)
RBC: 3.9 MIL/uL — ABNORMAL LOW (ref 4.22–5.81)
RDW: 17.3 % — ABNORMAL HIGH (ref 11.5–15.5)
WBC: 7 10*3/uL (ref 4.0–10.5)
nRBC: 0 % (ref 0.0–0.2)

## 2019-10-14 LAB — POCT I-STAT 7, (LYTES, BLD GAS, ICA,H+H)
Acid-base deficit: 7 mmol/L — ABNORMAL HIGH (ref 0.0–2.0)
Bicarbonate: 20.6 mmol/L (ref 20.0–28.0)
Calcium, Ion: 1.01 mmol/L — ABNORMAL LOW (ref 1.15–1.40)
HCT: 42 % (ref 39.0–52.0)
Hemoglobin: 14.3 g/dL (ref 13.0–17.0)
O2 Saturation: 100 %
Potassium: 3.5 mmol/L (ref 3.5–5.1)
Sodium: 136 mmol/L (ref 135–145)
TCO2: 22 mmol/L (ref 22–32)
pCO2 arterial: 47.1 mmHg (ref 32.0–48.0)
pH, Arterial: 7.249 — ABNORMAL LOW (ref 7.350–7.450)
pO2, Arterial: 354 mmHg — ABNORMAL HIGH (ref 83.0–108.0)

## 2019-10-14 LAB — COMPREHENSIVE METABOLIC PANEL
ALT: 17 U/L (ref 0–44)
AST: 21 U/L (ref 15–41)
Albumin: 2.3 g/dL — ABNORMAL LOW (ref 3.5–5.0)
Alkaline Phosphatase: 33 U/L — ABNORMAL LOW (ref 38–126)
Anion gap: 7 (ref 5–15)
BUN: 44 mg/dL — ABNORMAL HIGH (ref 8–23)
CO2: 18 mmol/L — ABNORMAL LOW (ref 22–32)
Calcium: 7.1 mg/dL — ABNORMAL LOW (ref 8.9–10.3)
Chloride: 109 mmol/L (ref 98–111)
Creatinine, Ser: 1.25 mg/dL — ABNORMAL HIGH (ref 0.61–1.24)
GFR calc Af Amer: >60 mL/min (ref >60)
GFR calc non Af Amer: 53 mL/min — ABNORMAL LOW (ref >60)
Glucose, Bld: 167 mg/dL — ABNORMAL HIGH (ref 70–99)
Potassium: 3 mmol/L — ABNORMAL LOW (ref 3.5–5.1)
Sodium: 134 mmol/L — ABNORMAL LOW (ref 135–145)
Total Bilirubin: 0.9 mg/dL (ref 0.3–1.2)
Total Protein: 4.7 g/dL — ABNORMAL LOW (ref 6.5–8.1)

## 2019-10-14 LAB — TRIGLYCERIDES: Triglycerides: 342 mg/dL — ABNORMAL HIGH (ref <150)

## 2019-10-14 LAB — PHOSPHORUS
Phosphorus: 2 mg/dL — ABNORMAL LOW (ref 2.5–4.6)
Phosphorus: 4.3 mg/dL (ref 2.5–4.6)

## 2019-10-14 LAB — MAGNESIUM: Magnesium: 3.2 mg/dL — ABNORMAL HIGH (ref 1.7–2.4)

## 2019-10-14 LAB — PREALBUMIN: Prealbumin: <5 mg/dL — ABNORMAL LOW (ref 18–38)

## 2019-10-14 MED ORDER — POLYETHYLENE GLYCOL 3350 17 G PO PACK: 17.0000 g | PACK | Freq: Every day | ORAL | Status: DC

## 2019-10-14 MED ORDER — FENTANYL CITRATE (PF) 100 MCG/2ML IJ SOLN
25.0000 ug | Freq: Once | INTRAMUSCULAR | Status: AC
  Administered 2019-10-14: 25 ug via INTRAVENOUS

## 2019-10-14 MED ORDER — MIDAZOLAM HCL 2 MG/2ML IJ SOLN
2.0000 mg | INTRAMUSCULAR | Status: DC | PRN
  Administered 2019-10-15: 2 mg via INTRAVENOUS
  Filled 2019-10-14: qty 2

## 2019-10-14 MED ORDER — FENTANYL BOLUS VIA INFUSION
25.0000 ug | INTRAVENOUS | Status: DC | PRN
  Filled 2019-10-14: qty 25

## 2019-10-14 MED ORDER — POTASSIUM PHOSPHATES 15 MMOLE/5ML IV SOLN
30.0000 mmol | Freq: Once | INTRAVENOUS | Status: AC
  Administered 2019-10-14: 30 mmol via INTRAVENOUS
  Filled 2019-10-14: qty 10

## 2019-10-14 MED ORDER — CALCIUM GLUCONATE-NACL 1-0.675 GM/50ML-% IV SOLN
1.0000 g | Freq: Once | INTRAVENOUS | Status: AC
  Administered 2019-10-14: 1000 mg via INTRAVENOUS
  Filled 2019-10-14: qty 50

## 2019-10-14 MED ORDER — FUROSEMIDE 10 MG/ML IJ SOLN
40.0000 mg | Freq: Once | INTRAMUSCULAR | Status: AC
  Administered 2019-10-14: 40 mg via INTRAVENOUS
  Filled 2019-10-14: qty 4

## 2019-10-14 MED ORDER — CHLORHEXIDINE GLUCONATE 0.12% ORAL RINSE (MEDLINE KIT)
15.0000 mL | Freq: Two times a day (BID) | OROMUCOSAL | Status: DC
  Administered 2019-10-14 (×2): 15 mL via OROMUCOSAL

## 2019-10-14 MED ORDER — DOCUSATE SODIUM 50 MG/5ML PO LIQD: 100.0000 mg | Freq: Two times a day (BID) | ORAL | Status: DC

## 2019-10-14 MED ORDER — POTASSIUM CHLORIDE 10 MEQ/50ML IV SOLN
10.0000 meq | INTRAVENOUS | Status: AC
  Administered 2019-10-14 (×4): 10 meq via INTRAVENOUS
  Filled 2019-10-14 (×4): qty 50

## 2019-10-14 MED ORDER — TRAVASOL 10 % IV SOLN
INTRAVENOUS | Status: DC
  Filled 2019-10-14: qty 954

## 2019-10-14 MED ORDER — FENTANYL 2500MCG IN NS 250ML (10MCG/ML) PREMIX INFUSION
25.0000 ug/h | INTRAVENOUS | Status: DC
  Administered 2019-10-14: 25 ug/h via INTRAVENOUS
  Filled 2019-10-14 (×2): qty 250

## 2019-10-14 MED ORDER — POTASSIUM CHLORIDE 10 MEQ/50ML IV SOLN
10.0000 meq | INTRAVENOUS | Status: DC
  Administered 2019-10-14: 10 meq via INTRAVENOUS
  Filled 2019-10-14: qty 50

## 2019-10-14 MED ORDER — ORAL CARE MOUTH RINSE
15.0000 mL | OROMUCOSAL | Status: DC
  Administered 2019-10-14 (×5): 15 mL via OROMUCOSAL

## 2019-10-14 MED ORDER — TRAVASOL 10 % IV SOLN
INTRAVENOUS | Status: AC
  Filled 2019-10-14: qty 954

## 2019-10-14 NOTE — Progress Notes (Addendum)
Progress Note  Patient Name: Matthew Schwartz Date of Encounter: 10/14/2019  [facility] Cardiologist: Candee Furbish, MD   Subjective   Sedated, but opens eyes.  Daughter in room   Inpatient Medications    Scheduled Meds: . chlorhexidine gluconate (MEDLINE KIT)  15 mL Mouth Rinse BID  . chlorhexidine gluconate (MEDLINE KIT)  15 mL Mouth Rinse BID  . Chlorhexidine Gluconate Cloth  6 each Topical Daily  . docusate  100 mg Oral BID  . insulin aspart  0-15 Units Subcutaneous Q4H  . mouth rinse  15 mL Mouth Rinse 10 times per day  . mouth rinse  15 mL Mouth Rinse 10 times per day  . pantoprazole (PROTONIX) IV  40 mg Intravenous Q12H  . polyethylene glycol  17 g Oral Daily  . sodium chloride flush  10-40 mL Intracatheter Q12H   Continuous Infusions: . sodium chloride Stopped (10/14/19 0834)  . amiodarone 25 mg/hr (10/14/19 0935)  . calcium gluconate    . fentaNYL infusion INTRAVENOUS 125 mcg/hr (10/14/19 0935)  . piperacillin-tazobactam (ZOSYN)  IV Stopped (10/14/19 0825)  . potassium chloride 10 mEq (10/14/19 0936)  . potassium chloride    . potassium PHOSPHATE IVPB (in mmol) 85 mL/hr at 10/14/19 0935  . propofol (DIPRIVAN) infusion Stopped (10/14/19 0909)  . TPN ADULT (ION) 40 mL/hr at 10/14/19 0935  . TPN ADULT (ION)     PRN Meds: acetaminophen **OR** acetaminophen, fentaNYL, fentaNYL (SUBLIMAZE) injection, midazolam, ondansetron (ZOFRAN) IV, sodium chloride flush   Vital Signs    Vitals:   10/14/19 0700 10/14/19 0800 10/14/19 0814 10/14/19 0900  BP:  (!) 145/54 (!) 171/44 (!) 142/57  Pulse: 78 76 80 78  Resp: _0 Temp:  98 F (36.7 C)    TempSrc:  Oral    SpO2: 100% 100% 100% 100%  Weight:      Height:        Intake/Output Summary (Last 24 hours) at 10/14/2019 0955 Last data filed at 10/14/2019 0935 Gross per 24 hour  Intake 3434.75 ml  Output 685 ml  Net 2749.75 ml   Last 3 Weights 10/13/2019 10/09/2019 07/22/2019  Weight (lbs) 212 lb 8.4 oz 200 lb 206  lb  Weight (kg) 96.4 kg 90.719 kg 93.441 kg      Telemetry    SR with PVCs freq at times and 2-3 beats of NSVT  - Personally Reviewed  ECG    No new  - Personally Reviewed  Physical Exam   GEN: No acute distress.   Neck: No JVD Cardiac: RRR, no murmurs, rubs, or gallops.  Respiratory: Clear to auscultation bilaterally. ANT GI: Soft, nontender, non-distended  MS: No edema; No deformity. Neuro:  Nonfocal  Psych: Normal affect   Labs    High Sensitivity Troponin:   Recent Labs  Lab 10/12/19 0446 10/13/19 0230 10/13/19 0459 10/13/19 1014  TROPONINIHS 48* 67* 64* 55*      Chemistry Recent Labs  Lab 10/12/19 0320 10/12/19 0320 10/12/19 1430 10/13/19 0230 10/14/19 0409  NA 135   < > 134* 136 134*  K 3.0*   < > 4.6 3.5 3.0*  CL 98   < > 102 106 109  CO2 23   < > 20* 19* 18*  GLUCOSE 186*   < > 196* 178* 167*  BUN 25*   < > 34* 37* 44*  CREATININE 0.98   < > 1.24 1.19 1.25*  CALCIUM 8.1*   < > 7.8* 7.3* 7.1*  PROT 6.2*  --   --  4.8* 4.7*  ALBUMIN 3.4*  --   --  2.9* 2.3*  AST 32  --   --  31 21  ALT 22  --   --  25 17  ALKPHOS 46  --   --  26* 33*  BILITOT 1.6*  --   --  1.9* 0.9  GFRNONAA >60   < > 53* 56* 53*  GFRAA >60   < > >60 >60 >60  ANIONGAP 14   < > _0 < > = values in this interval not displayed.     Hematology Recent Labs  Lab 10/12/19 1430 10/13/19 0230 10/14/19 0409  WBC 3.6* 4.1 7.0  RBC 5.41 4.68 3.90*  HGB 14.1 12.4* 10.4*  HCT 43.9 37.6* 31.3*  MCV 81.1 80.3 80.3  MCH 26.1 26.5 26.7  MCHC 32.1 33.0 33.2  RDW 17.6* 17.2* 17.3*  PLT 258 211 161    BNPNo results for input(s): BNP, PROBNP in the last 168 hours.   DDimer No results for input(s): DDIMER in the last 168 hours.   Radiology    DG Chest 1 View  Result Date: 10/13/2019 CLINICAL DATA:  Status post PICC placement. EXAM: CHEST  1 VIEW COMPARISON:  Single-view of the chest today at 4:31 a.m. FINDINGS: Endotracheal tube and NG tube are again seen. The patient has  a new left PICC with the tip projecting in the lower superior vena cava. Right pleural effusion and basilar airspace disease persist. There is discoid atelectasis in the left mid lung. No pneumothorax. Heart size is upper normal. IMPRESSION: Tip of new left PICC projects in the lower superior vena cava. Endotracheal tube and NG tube are unchanged. No change in a small right pleural effusion and basilar atelectasis. Electronically Signed   By: Inge Rise M.D.   On: 10/13/2019 19:01   DG CHEST PORT 1 VIEW  Result Date: 10/14/2019 CLINICAL DATA:  Intubation EXAM: PORTABLE CHEST 1 VIEW COMPARISON:  10/13/2019 FINDINGS: Endotracheal tube and NG tube unchanged. Elevation of RIGHT hemidiaphragm. Small bilateral pleural effusions and basilar atelectasis. Platelike atelectasis in the LEFT mid lung. No pneumothorax. No pulmonary edema. IMPRESSION: 1. No significant interval change. 2. Bibasilar atelectasis and small effusions. Electronically Signed   By: Suzy Bouchard M.D.   On: 10/14/2019 08:27   DG Chest Port 1 View  Result Date: 10/13/2019 CLINICAL DATA:  Respiratory failure EXAM: PORTABLE CHEST 1 VIEW COMPARISON:  Yesterday FINDINGS: Endotracheal tube with tip at the clavicular heads. The enteric tube at least reaches the diaphragm. Right IJ line with tip at the upper cavoatrial junction. Low volume chest with increased atelectasis. No visible effusion or pneumothorax. Very distorted heart size due to rotation. IMPRESSION: 1. Unremarkable hardware positioning. 2. Increased atelectasis. Electronically Signed   By: Monte Fantasia M.D.   On: 10/13/2019 05:11   DG CHEST PORT 1 VIEW  Result Date: 10/12/2019 CLINICAL DATA:  Status post endotracheal tube placement. EXAM: PORTABLE CHEST 1 VIEW COMPARISON:  December 09, 2006. FINDINGS: The heart size and mediastinal contours are within normal limits. Endotracheal tube appears to be in grossly good position. Right internal jugular catheter is noted with distal  tip in expected position of cavoatrial junction. Nasogastric tube is seen entering stomach. No pneumothorax is noted. Left lung is clear. Mild right basilar subsegmental atelectasis is noted with probable small right pleural effusion. The visualized skeletal structures are unremarkable. IMPRESSION: Endotracheal tube in grossly good position.  Mild right basilar subsegmental atelectasis is noted with probable small right pleural effusion. No pneumothorax is noted. Electronically Signed   By: Marijo Conception M.D.   On: 10/12/2019 19:20   ECHOCARDIOGRAM COMPLETE  Result Date: 10/13/2019    ECHOCARDIOGRAM REPORT   Patient Name:   Matthew Schwartz Date of Exam: 10/13/2019 Medical Rec #:  [id number]  Height:       71.0 in Accession #:    [id number] Weight:       212.5 lb Date of Birth:  [birth year]  BSA:          2.164 m Patient Age:    27 years   BP:           115/39 mmHg Patient Gender: M          HR:           80 bpm. Exam Location:  Inpatient Procedure: 2D Echo, Cardiac Doppler, Color Doppler and Intracardiac            Opacification Agent Indications:    R94.31 Abnormal EKG  History:        Patient has no prior history of Echocardiogram examinations.                 Risk Factors:Hypertension, Diabetes, Dyslipidemia and GERD.                 Cancer.  Sonographer:    Tiffany Dance Referring Phys: [id number] Donita Brooks  Sonographer Comments: Echo performed with patient supine and on artificial respirator, suboptimal parasternal window and no subcostal window. IMPRESSIONS  1. Image quality is poor. There appears to be regional wall motion variability, but confident wall motion analysis is impossible, even after contrast administration. Left ventricular ejection fraction, by estimation, is 35 to 40%. The left ventricle has  moderately decreased function. The left ventricle demonstrates regional wall motion abnormalities. Left ventricular diastolic parameters are consistent with Grade I diastolic dysfunction (impaired  relaxation).  2. Right ventricular systolic function is moderately reduced. The right ventricular size is mildly enlarged. Tricuspid regurgitation signal is inadequate for assessing PA pressure.  3. The mitral valve is normal in structure. No evidence of mitral valve regurgitation.  4. The aortic valve was not well visualized. Aortic valve regurgitation is not visualized. No aortic stenosis is present. Comparison(s): No prior Echocardiogram. FINDINGS  Left Ventricle: Image quality is poor. There appears to be regional wall motion variability, but confident wall motion analysis is impossible, even after contrast administration. Left ventricular ejection fraction, by estimation, is 35 to 40%. The left ventricle has moderately decreased function. The left ventricle demonstrates regional wall motion abnormalities. Definity contrast agent was given IV to delineate the left ventricular endocardial borders. The left ventricular internal cavity size was normal in size. There is no left ventricular hypertrophy. Left ventricular diastolic parameters are consistent with Grade I diastolic dysfunction (impaired relaxation). Normal left ventricular filling pressure. Right Ventricle: The right ventricular size is mildly enlarged. No increase in right ventricular wall thickness. Right ventricular systolic function is moderately reduced. Tricuspid regurgitation signal is inadequate for assessing PA pressure. Left Atrium: Left atrial size was normal in size. Right Atrium: Right atrial size was normal in size. Pericardium: There is no evidence of pericardial effusion. Mitral Valve: The mitral valve is normal in structure. No evidence of mitral valve regurgitation. Tricuspid Valve: The tricuspid valve is not well visualized. Tricuspid valve regurgitation is not demonstrated. Aortic Valve: The aortic valve was not well visualized. Aortic  valve regurgitation is not visualized. No aortic stenosis is present. Pulmonic Valve: The pulmonic  valve was not well visualized. Pulmonic valve regurgitation is not visualized. Aorta: The aortic root is normal in size and structure. IAS/Shunts: The interatrial septum was not assessed.   Diastology LV e' lateral:   11.00 cm/s LV E/e' lateral: 4.2 LV e' medial:    5.77 cm/s LV E/e' medial:  8.0  RIGHT VENTRICLE RV Basal diam:  2.00 cm RV S prime:     11.60 cm/s TAPSE (M-mode): 1.5 cm LEFT ATRIUM           Index       RIGHT ATRIUM          Index LA Vol (A2C): 26.8 ml 12.39 ml/m RA Area:     6.72 cm                                   RA Volume:   9.56 ml  4.42 ml/m  AORTIC VALVE LVOT Vmax:   74.80 cm/s LVOT Vmean:  46.800 cm/s LVOT VTI:    0.120 m MITRAL VALVE MV Area (PHT): 2.26 cm    SHUNTS MV Decel Time: 335 msec    Systemic VTI: 0.12 m MV E velocity: 46.00 cm/s MV A velocity: 67.00 cm/s MV E/A ratio:  0.69 Mihai Croitoru MD Electronically signed by Sanda Klein MD Signature Date/Time: 10/13/2019/5:14:25 PM    Final    [place] EKG SITE RITE  Result Date: 10/13/2019 If Site Rite image not attached, placement could not be confirmed due to current cardiac rhythm.   Cardiac Studies   ECHO 10/13/19 IMPRESSIONS  1. Image quality is poor. There appears to be regional wall motion  variability, but confident wall motion analysis is impossible, even after  contrast administration. Left ventricular ejection fraction, by  estimation, is 35 to 40%. The left ventricle has  moderately decreased function. The left ventricle demonstrates regional  wall motion abnormalities. Left ventricular diastolic parameters are  consistent with Grade I diastolic dysfunction (impaired relaxation).  2. Right ventricular systolic function is moderately reduced. The right  ventricular size is mildly enlarged. Tricuspid regurgitation signal is  inadequate for assessing PA pressure.  3. The mitral valve is normal in structure. No evidence of mitral valve  regurgitation.  4. The aortic valve was not well visualized. Aortic  valve regurgitation  is not visualized. No aortic stenosis is present.   Comparison(s): No prior Echocardiogram.   FINDINGS  Left Ventricle: Image quality is poor. There appears to be regional wall  motion variability, but confident wall motion analysis is impossible, even  after contrast administration. Left ventricular ejection fraction, by  estimation, is 35 to 40%. The left  ventricle has moderately decreased function. The left ventricle  demonstrates regional wall motion abnormalities. Definity contrast agent  was given IV to delineate the left ventricular endocardial borders. The  left ventricular internal cavity size was  normal in size. There is no left ventricular hypertrophy. Left ventricular  diastolic parameters are consistent with Grade I diastolic dysfunction  (impaired relaxation). Normal left ventricular filling pressure.   Right Ventricle: The right ventricular size is mildly enlarged. No  increase in right ventricular wall thickness. Right ventricular systolic  function is moderately reduced. Tricuspid regurgitation signal is  inadequate for assessing PA pressure.   Left Atrium: Left atrial size was normal in size.   Right Atrium: Right atrial size was  normal in size.   Pericardium: There is no evidence of pericardial effusion.   Mitral Valve: The mitral valve is normal in structure. No evidence of  mitral valve regurgitation.   Tricuspid Valve: The tricuspid valve is not well visualized. Tricuspid  valve regurgitation is not demonstrated.   Aortic Valve: The aortic valve was not well visualized. Aortic valve  regurgitation is not visualized. No aortic stenosis is present.   Pulmonic Valve: The pulmonic valve was not well visualized. Pulmonic valve  regurgitation is not visualized.   Aorta: The aortic root is normal in size and structure.   IAS/Shunts: The interatrial septum was not assessed.      Patient Profile     86 y.o. male  with a hx of CAD  with PTCA 2000 to RCA, DM, HTN, HLD now admitted with perforated duodenal ulcer and peritonitis with NSVT and abnormal EKG. New CM.    Assessment & Plan    1. NSVT in acute setting with stable K+ and Mg+. Now on amiodarone and improved - continue amiodarone. 2. abnormal EKG on presentation with deep T wave inversions - troponins mildly elevated -  May be demand ischemia now with cardiomyopathy with EF 35-40% and possible RWMA but difficult analysis  - needs ischemic work up   3. New cardiomyopathy and RV systolic function reduced, moderately.   I&O [id number] with sepsis 4. CAD with hx of PTCA alone in 2000 in RCA I do not have record.  5. Sepsis due to peritonitis with hypotension, now off of vasopressors 6. Acute respiratory failure post op vent support per CCM sedated 7. Pleural effusion on CXR Rt > Lt and lasix 40 mg IV given per CCM 8. Hypokalemia with K+3.0 - replacing by CCM  Mg+ is 3.2  9. Acute ABD with perforated duodenal ulcer and emergent exploratory lap and s/p omental patch repair. on ABX per surgery and CCM      For questions or updates, please contact [facility] Please consult [email or web address] for contact info under        Signed, Cecilie Kicks, NP  10/14/2019, 9:55 AM    Patient seen and examined with Cecilie Kicks NP.  Agree as above, with the following exceptions and changes as noted below.  Mr. Lema is more alert today, but remains sedated with fentanyl and intubated.  He raises his hand to acknowledge my questions, and responds appropriately with head nodding when asked directly.  I have thoroughly reviewed his cardiovascular status with his daughter [name] who is present in the room today.  We discussed his presentation to the hospital with perforated duodenal ulcer, ECG abnormalities, troponins, and persistent T wave abnormalities in the setting of an abnormal echocardiogram.. Gen: Intubated, mildly sedated but responsive, CV: RRR, no murmurs, Lungs: Clear anteriorly, Abd:  soft, distended but not tense, Extrem: Warm, no edema, Neuro/Psych: Intubated, arouses easily, remains on sedation.  All available labs, radiology testing, previous records reviewed.    I have reviewed the patient's care in depth at the bedside with the patient and his daughter.  Patient was intermittently alert for the conversation.  We have also discussed his care with the critical care team.  Vasopressors have been discontinued, blood pressure is adequately maintained.  He demonstrates evidence of biventricular dysfunction.  He has persistent T wave abnormalities and QT prolongation.  While this may all be secondary to acute illness, sepsis, and recent surgery and may represent stress cardiomyopathy, we cannot exclude the possibility of ischemia at this time.  It is interesting that his troponins were not significantly elevated on presentation despite perforation of duodenal ulcer and peritonitis.  Nevertheless he has a history of coronary artery disease with PTCA to the RCA in year 2000, and ischemia will need to be excluded before we can attribute these findings to stress cardiomyopathy.  He has recently had discontinuation of vasopressors, and it is not yet appropriate to initiate heart failure therapy, though we can consider this tomorrow if he continues to improve.  When safe from a postsurgical standpoint, we will need to consider coronary angiography with the possibility of revascularization and dual antiplatelet therapy.  We will discuss this with the surgical team as well as the critical care team for optimal timing.  It does not appear he is having an acute ischemic event and we are at least 3 days out from the initial event by ECG changes, therefore we will plan this appropriately for best timing. For nonsustained ventricular tachycardia, continue amiodarone IV.  QTC monitoring is overall stable, continue to optimize electrolytes.   I reviewed all this in detail with his daughter [name] and all  questions were answered to the best of my ability.   Elouise Munroe, MD 10/14/19 4:54 PM  CRITICAL CARE Performed by: Cherlynn Kaiser, MD   Total critical care time: 35 minutes   Critical care time was exclusive of separately billable procedures and treating other patients.   Critical care was necessary to treat or prevent imminent or life-threatening deterioration.   Critical care was time spent personally by me (independent of APPs or residents) on the following activities: development of treatment plan with patient and/or surrogate as well as nursing, discussions with consultants, evaluation of patient's response to treatment, examination of patient, obtaining history from patient or surrogate, ordering and performing treatments and interventions, ordering and review of laboratory studies, ordering and review of radiographic studies, pulse oximetry and re-evaluation of patient's condition.

## 2019-10-14 NOTE — Progress Notes (Signed)
NAME:  Matthew Schwartz, MRN:  [id number], DOB:  November 27, 1932, LOS: 2 ADMISSION DATE:  10/12/2019, CONSULTATION DATE:  10/12/2019 REFERRING MD:  Dr. [name], Surgery, CHIEF COMPLAINT:  Abdominal pain  Brief History   86 y/o male, former smoker, presented with nausea/vomiting, abdominal pain and constipation x 4 to 5 days.  Seen twice prior to current admit for the same with imaging studies unremarkable for cause.  On return 6/14, seen by GI and found to have concern for acute abdomen and taken for exploratory laparotomy.  Found to have perforated duodenal ulcer s/p omental patch repair.  Remained on vent post op.  PCCM consulted for critical care management.  Past Medical History  Prostate cancer, HTN, GERD, HLD, DM  Significant Hospital Events   6/14 Admit, exploratory laparotomy, omental patch repair of duodenal ulcer 6/16 Off vasopressors, less drainage from abd drain / clearing   Consults:  Gastroenterology General surgery  Procedures:  Rt IJ CVL 6/14 >> 6/15 Rt radial a line 6/14 >> ETT 6/14 >> LUE PICC 6/16 >>   Significant Diagnostic Tests:   CT angio abd/pelvis 6/13 >> mild atherosclerosis, no significant arterial stenosis of mesenteric vasculature, no findings of bowel ischemia, colonic diverticulosis w/o diverticulitis, b/l renal cysts, 12 mm lesion upper pole Rt kidney  ECHO 6/15 >> poor image quality, appears to be regional wall motion variability but confident wall motion analysis is impossible even after contrast administration, LVEF ~ 23-76%, Grade I diastolic dysfunction, RV systolic function is moderately reduced, RV is mildly enlarged  Micro Data:  SARS CoV2 6/13 >> negative UC 6/14 >> less than 10k insignificant growth   Antimicrobials:  Zosyn 6/14 >>  Interim history/subjective:  Off neo, remains on amiodarone  Glucose range 123-196 Tmax 98.8 / WBC 7 UOP 669ml, 2.9L+ in 24 hours  K, Ca replaced per Elink  Objective   Blood pressure (!) 98/42, pulse 78,  temperature 98.3 F (36.8 C), temperature source Axillary, resp. rate 17, height 5\' 11"  (1.803 m), weight 96.4 kg, SpO2 100 %. CVP:  [6 mmHg-10 mmHg] 6 mmHg  Vent Mode: PRVC FiO2 (%):  [30 %-40 %] 30 % Set Rate:  [16 bmp] 16 bmp Vt Set:  [580 mL] 580 mL PEEP:  [5 cmH20] 5 cmH20 Plateau Pressure:  [16 cmH20-17 cmH20] 17 cmH20   Intake/Output Summary (Last 24 hours) at 10/14/2019 2831 Last data filed at 10/14/2019 0400 Gross per 24 hour  Intake 3673.15 ml  Output 685 ml  Net 2988.15 ml   Filed Weights   10/13/19 0923  Weight: 96.4 kg    Examination: General: elderly male lying in bed in NAD on vent   HEENT: MM pink/moist, ETT, NGT Neuro: sedate CV: s1s2 rrr, frequent ectopy on monitor, no m/r/g PULM: non-labored on vent, lungs bilaterally clear GI: soft, bsx4 active  Extremities: warm/dry, 1-2+ generalized edema  Skin: no rashes or lesions  CXR 6/16 >> images personally reviewed, suspected right effusion with atelectasis, plate-like atelectasis on left, ETT in good position  Resolved Hospital Problem list     Assessment & Plan:   Septic shock from peritonitis in setting of perforated duodenal ulcer s/p omental patch repair. -abx as above -post operative care, nutrition per CCS -continue protonix BID   Acute respiratory failure with hypoxia in setting of sepsis and peritonitis. -PRVC 8cc/kg  -ok for SBT but suspect given abdominal surgery that he may need one more day before extubation  -follow intermittent CXR  -40 mg IV lasix x1  -if  CXR not clearing with volume removal, may need to [place] chest for possible thora -RASS Goal 0 to -1  -PAD protocol, change to fentanyl gtt for pain/sedation and PRN versed with elevated triglycerides / TPN   PVC's / NSVT  Prolonged QTc Acute vs Chronic Systolic CHF - no baseline LVEF, ? Demand physiology  -appreciate Cardiology input  -continue amiodarone  -follow electrolytes closely, ensure K>4, M>2 -may need ischemic work up once  clear from acute issues  -no anticoagulation due to recent surgical procedure  DM type II poorly controlled with hyperglycemia. -SSI  -hold home metformin   At Risk Malnutrition  -TPN per pharmacy  -plan per CCS is to assess barium study around POD 5  Hx of HTN, HLD. -ICU monitoring  -hold home norvasc, imdur, toprol, altace, ASA, lipitor   Best practice:  Diet: NPO DVT prophylaxis: SCDs GI prophylaxis: Protonix Mobility: Bed rest Code Status: full cod Disposition: ICU  Labs   CBC: Recent Labs  Lab 10/11/19 1209 10/12/19 0446 10/12/19 1430 10/13/19 0230 10/14/19 0409  WBC 10.2 5.0 3.6* 4.1 7.0  NEUTROABS  --  4.2  --  3.4 6.3  HGB 13.1 14.0 14.1 12.4* 10.4*  HCT 39.4 42.0 43.9 37.6* 31.3*  MCV 79.0* 80.5 81.1 80.3 80.3  PLT 266 231 258 211 315    Basic Metabolic Panel: Recent Labs  Lab 10/11/19 1209 10/12/19 0320 10/12/19 1430 10/13/19 0230 10/14/19 0409  NA 133* 135 134* 136 134*  K 3.1* 3.0* 4.6 3.5 3.0*  CL 98 98 102 106 109  CO2 24 23 20* 19* 18*  GLUCOSE 141* 186* 196* 178* 167*  BUN 22 25* 34* 37* 44*  CREATININE 0.69 0.98 1.24 1.19 1.25*  CALCIUM 8.9 8.1* 7.8* 7.3* 7.1*  MG  --   --  2.6* 3.4* 3.2*  PHOS  --   --   --  2.8 2.0*   GFR: Estimated Creatinine Clearance: 52.1 mL/min (A) (by C-G formula based on SCr of 1.25 mg/dL (H)). Recent Labs  Lab 10/11/19 1209 10/11/19 1355 10/12/19 0446 10/12/19 1430 10/13/19 0230 10/14/19 0409  WBC   < >  --  5.0 3.6* 4.1 7.0  LATICACIDVEN  --  1.7  --   --   --   --    < > = values in this interval not displayed.    Liver Function Tests: Recent Labs  Lab 10/09/19 1054 10/11/19 1209 10/12/19 0320 10/13/19 0230 10/14/19 0409  AST 52* 31 32 31 21  ALT 15 22 22 25 17   ALKPHOS 55 55 46 26* 33*  BILITOT 1.7* 1.5* 1.6* 1.9* 0.9  PROT 7.7 7.0 6.2* 4.8* 4.7*  ALBUMIN 4.2 3.9 3.4* 2.9* 2.3*   Recent Labs  Lab 10/09/19 1054 10/11/19 1209 10/12/19 0320  LIPASE 33 36 44   No results for  input(s): AMMONIA in the last 168 hours.  ABG    Component Value Date/Time   PHART 7.440 10/13/2019 0311   PCO2ART 30.2 (L) 10/13/2019 0311   PO2ART 107 10/13/2019 0311   HCO3 20.2 10/13/2019 0311   ACIDBASEDEF 2.6 (H) 10/13/2019 0311   O2SAT 97.5 10/13/2019 0311     Coagulation Profile: No results for input(s): INR, PROTIME in the last 168 hours.  Cardiac Enzymes: No results for input(s): CKTOTAL, CKMB, CKMBINDEX, TROPONINI in the last 168 hours.  HbA1C: Hgb A1c MFr Bld  Date/Time Value Ref Range Status  10/12/2019 02:30 PM 6.6 (H) 4.8 - 5.6 % Final  Comment:    (NOTE) Pre diabetes:          5.7%-6.4%  Diabetes:              >6.4%  Glycemic control for   <7.0% adults with diabetes     CBG: Recent Labs  Lab 10/13/19 1206 10/13/19 1653 10/13/19 1940 10/13/19 2336 10/14/19 0405  GLUCAP 123* 117* 145* 149* 143*     Critical care time: 35 minutes   Noe Gens, MSN, NP-C [facility] 10/14/2019, 8:07 AM   Please see [email or web address] for pager details.

## 2019-10-14 NOTE — Progress Notes (Signed)
[facility] Progress Note Patient Name: Matthew Schwartz DOB: 1933-01-22 MRN: [id number]   Date of Service  10/14/2019  HPI/Events of Note  Multiple issues: 1. Hypokalemia - K+ = 3.0 and Creatinine = 1.25, 2. Hypophosphatemia - PO4--- = 2.0 and 3. Hypocalcemia - CA++ = 7.1 which corrects to 8.46 (Low) given albumin = 2.3.  eICU Interventions  Will replace K+, PO4--- and Ca++. Repeat BMP and Phosphorus level at 2 PM.      Intervention Category Major Interventions: Electrolyte abnormality - evaluation and management  Kissie Ziolkowski [place] 10/14/2019, 6:07 AM

## 2019-10-14 NOTE — Progress Notes (Signed)
[facility] Progress Note Patient Name: Matthew Schwartz DOB: [birth year] MRN: [id number]   Date of Service  10/14/2019  HPI/Events of Note  Agitation   eICU Interventions  Plan: 1. Increase Versed dose to 2 mg IV Q 2 hours PRN agitation or sedation.     Intervention Category Major Interventions: Delirium, psychosis, severe agitation - evaluation and management  Alyscia Carmon [place] 10/14/2019, 9:40 PM

## 2019-10-14 NOTE — Progress Notes (Signed)
2 Days Post-Op   Subjective/Chief Complaint: Intubated and sedated JP drainage down to 35cc last 24 hours   Objective: Vital signs in last 24 hours: Temp:  [98 F (36.7 C)-98.8 F (37.1 C)] 98.3 F (36.8 C) (06/16 0406) Pulse Rate:  [73-85] 78 (06/16 0700) Resp:  [16-21] 17 (06/16 0700) BP: (97-135)/(42-48) 98/42 (06/15 1200) SpO2:  [93 %-100 %] 100 % (06/16 0700) Arterial Line BP: (105-165)/(30-80) 157/43 (06/16 0700) FiO2 (%):  [30 %-40 %] 30 % (06/16 0400) Weight:  [96.4 kg] 96.4 kg (06/15 0923) Last BM Date: 10/13/19  Intake/Output from previous day: 06/15 0701 - 06/16 0700 In: 8099.8 [I.V.:3378.8; IV Piggyback:294.4] Out: 685 [Urine:650; Drains:35] Intake/Output this shift: No intake/output data recorded.  Exam: On vent Abdomen a little distended Drain with minimal fluid now only bile tinged  Lab Results:  Recent Labs    10/13/19 0230 10/14/19 0409  WBC 4.1 7.0  HGB 12.4* 10.4*  HCT 37.6* 31.3*  PLT 211 161   BMET Recent Labs    10/13/19 0230 10/14/19 0409  NA 136 134*  K 3.5 3.0*  CL 106 109  CO2 19* 18*  GLUCOSE 178* 167*  BUN 37* 44*  CREATININE 1.19 1.25*  CALCIUM 7.3* 7.1*   PT/INR No results for input(s): LABPROT, INR in the last 72 hours. ABG Recent Labs    10/12/19 1954 10/13/19 0311  PHART 7.435 7.440  HCO3 17.2* 20.2    Studies/Results: DG Chest 1 View  Result Date: 10/13/2019 CLINICAL DATA:  Status post PICC placement. EXAM: CHEST  1 VIEW COMPARISON:  Single-view of the chest today at 4:31 a.m. FINDINGS: Endotracheal tube and NG tube are again seen. The patient has a new left PICC with the tip projecting in the lower superior vena cava. Right pleural effusion and basilar airspace disease persist. There is discoid atelectasis in the left mid lung. No pneumothorax. Heart size is upper normal. IMPRESSION: Tip of new left PICC projects in the lower superior vena cava. Endotracheal tube and NG tube are unchanged. No change in a small  right pleural effusion and basilar atelectasis. Electronically Signed   By: Inge Rise M.D.   On: 10/13/2019 19:01   DG Chest Port 1 View  Result Date: 10/13/2019 CLINICAL DATA:  Respiratory failure EXAM: PORTABLE CHEST 1 VIEW COMPARISON:  Yesterday FINDINGS: Endotracheal tube with tip at the clavicular heads. The enteric tube at least reaches the diaphragm. Right IJ line with tip at the upper cavoatrial junction. Low volume chest with increased atelectasis. No visible effusion or pneumothorax. Very distorted heart size due to rotation. IMPRESSION: 1. Unremarkable hardware positioning. 2. Increased atelectasis. Electronically Signed   By: Monte Fantasia M.D.   On: 10/13/2019 05:11   DG CHEST PORT 1 VIEW  Result Date: 10/12/2019 CLINICAL DATA:  Status post endotracheal tube placement. EXAM: PORTABLE CHEST 1 VIEW COMPARISON:  December 09, 2006. FINDINGS: The heart size and mediastinal contours are within normal limits. Endotracheal tube appears to be in grossly good position. Right internal jugular catheter is noted with distal tip in expected position of cavoatrial junction. Nasogastric tube is seen entering stomach. No pneumothorax is noted. Left lung is clear. Mild right basilar subsegmental atelectasis is noted with probable small right pleural effusion. The visualized skeletal structures are unremarkable. IMPRESSION: Endotracheal tube in grossly good position. Mild right basilar subsegmental atelectasis is noted with probable small right pleural effusion. No pneumothorax is noted. Electronically Signed   By: Marijo Conception M.D.   On:  10/12/2019 19:20   ECHOCARDIOGRAM COMPLETE  Result Date: 10/13/2019    ECHOCARDIOGRAM REPORT   Patient Name:   TAYM TWIST Date of Exam: 10/13/2019 Medical Rec #:  [id number]  Height:       71.0 in Accession #:    [id number] Weight:       212.5 lb Date of Birth:  November 29, 1932  BSA:          2.164 m Patient Age:    86 years   BP:           115/39 mmHg Patient Gender: M           HR:           80 bpm. Exam Location:  Inpatient Procedure: 2D Echo, Cardiac Doppler, Color Doppler and Intracardiac            Opacification Agent Indications:    R94.31 Abnormal EKG  History:        Patient has no prior history of Echocardiogram examinations.                 Risk Factors:Hypertension, Diabetes, Dyslipidemia and GERD.                 Cancer.  Sonographer:    Tiffany Dance Referring Phys: [id number] Donita Brooks  Sonographer Comments: Echo performed with patient supine and on artificial respirator, suboptimal parasternal window and no subcostal window. IMPRESSIONS  1. Image quality is poor. There appears to be regional wall motion variability, but confident wall motion analysis is impossible, even after contrast administration. Left ventricular ejection fraction, by estimation, is 35 to 40%. The left ventricle has  moderately decreased function. The left ventricle demonstrates regional wall motion abnormalities. Left ventricular diastolic parameters are consistent with Grade I diastolic dysfunction (impaired relaxation).  2. Right ventricular systolic function is moderately reduced. The right ventricular size is mildly enlarged. Tricuspid regurgitation signal is inadequate for assessing PA pressure.  3. The mitral valve is normal in structure. No evidence of mitral valve regurgitation.  4. The aortic valve was not well visualized. Aortic valve regurgitation is not visualized. No aortic stenosis is present. Comparison(s): No prior Echocardiogram. FINDINGS  Left Ventricle: Image quality is poor. There appears to be regional wall motion variability, but confident wall motion analysis is impossible, even after contrast administration. Left ventricular ejection fraction, by estimation, is 35 to 40%. The left ventricle has moderately decreased function. The left ventricle demonstrates regional wall motion abnormalities. Definity contrast agent was given IV to delineate the left ventricular endocardial  borders. The left ventricular internal cavity size was normal in size. There is no left ventricular hypertrophy. Left ventricular diastolic parameters are consistent with Grade I diastolic dysfunction (impaired relaxation). Normal left ventricular filling pressure. Right Ventricle: The right ventricular size is mildly enlarged. No increase in right ventricular wall thickness. Right ventricular systolic function is moderately reduced. Tricuspid regurgitation signal is inadequate for assessing PA pressure. Left Atrium: Left atrial size was normal in size. Right Atrium: Right atrial size was normal in size. Pericardium: There is no evidence of pericardial effusion. Mitral Valve: The mitral valve is normal in structure. No evidence of mitral valve regurgitation. Tricuspid Valve: The tricuspid valve is not well visualized. Tricuspid valve regurgitation is not demonstrated. Aortic Valve: The aortic valve was not well visualized. Aortic valve regurgitation is not visualized. No aortic stenosis is present. Pulmonic Valve: The pulmonic valve was not well visualized. Pulmonic valve regurgitation is not visualized. Aorta: The aortic root  is normal in size and structure. IAS/Shunts: The interatrial septum was not assessed.   Diastology LV e' lateral:   11.00 cm/s LV E/e' lateral: 4.2 LV e' medial:    5.77 cm/s LV E/e' medial:  8.0  RIGHT VENTRICLE RV Basal diam:  2.00 cm RV S prime:     11.60 cm/s TAPSE (M-mode): 1.5 cm LEFT ATRIUM           Index       RIGHT ATRIUM          Index LA Vol (A2C): 26.8 ml 12.39 ml/m RA Area:     6.72 cm                                   RA Volume:   9.56 ml  4.42 ml/m  AORTIC VALVE LVOT Vmax:   74.80 cm/s LVOT Vmean:  46.800 cm/s LVOT VTI:    0.120 m MITRAL VALVE MV Area (PHT): 2.26 cm    SHUNTS MV Decel Time: 335 msec    Systemic VTI: 0.12 m MV E velocity: 46.00 cm/s MV A velocity: 67.00 cm/s MV E/A ratio:  0.69 Mihai Croitoru MD Electronically signed by Sanda Klein MD Signature Date/Time:  10/13/2019/5:14:25 PM    Final    [place] EKG SITE RITE  Result Date: 10/13/2019 If Site Rite image not attached, placement could not be confirmed due to current cardiac rhythm.   Anti-infectives: Anti-infectives (From admission, onward)   Start     Dose/Rate Route Frequency Ordered Stop   10/12/19 2000  piperacillin-tazobactam (ZOSYN) IVPB 3.375 g     Discontinue     3.375 g 12.5 mL/hr over 240 Minutes Intravenous Every 8 hours 10/12/19 1911     10/12/19 1708  ceFAZolin (ANCEF) 2-4 GM/100ML-% IVPB       Note to Pharmacy: Bennye Alm   : cabinet override      10/12/19 1708 10/12/19 1849      Assessment/Plan: s/p Procedure(s): EXPLORATORY LAPAROTOMY omental patch repair perforated duodenal ulcer (N/A)   Suspect fluid in drain was just residual fluid from perforation and not ongoing leak given decrease in output and change in consistency. Plan on leaving the drain and NG.  Will probably do a gastrograffin study through the NG around POD#5 to evaluate repair  Continue wound care IV antibiotics TNA for nutrition   LOS: 2 days    Coralie Keens 10/14/2019

## 2019-10-14 NOTE — Progress Notes (Addendum)
PHARMACY - TOTAL PARENTERAL NUTRITION CONSULT NOTE   Indication: Prolonged ileus  Patient Measurements: Height: 5\' 11"  (180.3 cm) (per daughter) Weight: 96.4 kg (212 lb 8.4 oz) IBW/kg (Calculated) : 75.3   Body mass index is 29.64 kg/m. Usual Weight: 90.7 kg   Assessment:  - Pharmacy is consulted to start TPN in 86 yo male with prolonged ileus. Pt underwent exploratory laparotomy on 6/14 which revealed perforated duodenal ulcer.   Glucose / Insulin: hx of DM - on metformin PTA.  - CBGs mostly well controlled on mSSI q4h (range 117-167; goal 100-150) - 11 units of insulin yesterday Electrolytes: K lower despite repletion with 40 meq yesterday; Mg remains elevated; phos now low; Ca and Na now borderline low Renal: SCr, BUN rising; bicarb decreased, UOP low LFTs / TGs: LFTs remain WNL, Tbili improved to WNL after procedure; TG now elevated after starting propofol, but remains < 400 mg/dL (will need to ensure this was drawn appropriately) Prealbumin / albumin: prealbumin undetectable; albumin low although previously normal on admission Intake / Output; MIVF: +8.8L since admission; on NS at 35 ml/hr GI Imaging:  Surgeries / Procedures:  - 6/14 exploratory laparotomy on 6/14 which revealed perforated duodenal ulcer.    Central access: Plan to place PICC Line 6/15 TPN start date: 6/15  Nutritional Goals: Pending RD recs  (per RD recommendation on 6/15): Kcal:  2200-2400 Protein:  115-125 grams Fluid:  >2L/d Goal TPN rate is 95 mL/hr (provides 121 g of protein and 2252 kcals per day)    Current Nutrition:  NPO  - Pt also on propofol infusion, currently at 30 mcg/kg/min, providing 430 kcal/d   Plan:   KPhos 30 mmol IV x 1 already ordered per MD (provides 45 meq potassium)  KCl 10 meq IV x 4  CaGluconate 1g IV x 1   Advance TPN to 75 mL/hr at 1800  Will hold lipids for now given propofol requirement and rising TG  Electrolytes in TPN: 78mEq/L of Na, 1mEq/L of K,  86mEq/L of Ca, 0 mEq/L of Mg, and 37mmol/L of Phos. Cl:Ac ratio max Ac  Add standard MVI and trace elements to TPN  Continue Moderate q4h SSI and adjust as needed   Continue MIVF at 35 mL/hr at 1800  Monitor TPN labs on Mon/Thurs; CCM already checking TG daily   Reuel Boom, PharmD, BCPS [phone] 10/14/2019, 7:33 AM

## 2019-10-14 NOTE — Progress Notes (Signed)
2 Days Post-Op  Subjective: CC: Sedated on vent. Family at bedside. Off neo. Still on amio drip.   Objective: Vital signs in last 24 hours: Temp:  [98 F (36.7 C)-98.8 F (37.1 C)] 98.3 F (36.8 C) (06/16 0406) Pulse Rate:  [73-85] 78 (06/16 0700) Resp:  [16-21] 17 (06/16 0700) BP: (97-135)/(42-48) 98/42 (06/15 1200) SpO2:  [93 %-100 %] 100 % (06/16 0700) Arterial Line BP: (105-165)/(30-80) 157/43 (06/16 0700) FiO2 (%):  [30 %-40 %] 30 % (06/16 0400) Weight:  [96.4 kg] 96.4 kg (06/15 0923) Last BM Date: 10/13/19  Intake/Output from previous day: 06/15 0701 - 06/16 0700 In: 0623.7 [I.V.:3378.8; IV Piggyback:294.4] Out: 685 [Urine:650; Drains:35] Intake/Output this shift: No intake/output data recorded. Vent Mode: PRVC FiO2 (%):  [30 %-40 %] 30 % Set Rate:  [16 bmp] 16 bmp Vt Set:  [580 mL] 580 mL PEEP:  [5 cmH20] 5 cmH20 Plateau Pressure:  [16 cmH20-17 cmH20] 17 cmH20   PE: Gen:  Sedated on vent  Card:  Reg rate Pulm:  CTA b/l. On vent with setting as above Abd: Soft, mild distension, no rigidity, hypoactive bowel sounds. Drain in place with thin clearing bile in drain. Midline wound dressed and just changed by nursing staff.  Ext:  No LE edema   Lab Results:  Recent Labs    10/13/19 0230 10/14/19 0409  WBC 4.1 7.0  HGB 12.4* 10.4*  HCT 37.6* 31.3*  PLT 211 161   BMET Recent Labs    10/13/19 0230 10/14/19 0409  NA 136 134*  K 3.5 3.0*  CL 106 109  CO2 19* 18*  GLUCOSE 178* 167*  BUN 37* 44*  CREATININE 1.19 1.25*  CALCIUM 7.3* 7.1*   PT/INR No results for input(s): LABPROT, INR in the last 72 hours. CMP     Component Value Date/Time   NA 134 (L) 10/14/2019 0409   NA 137 11/02/2016 1509   K 3.0 (L) 10/14/2019 0409   K 4.4 11/02/2016 1509   CL 109 10/14/2019 0409   CO2 18 (L) 10/14/2019 0409   CO2 27 11/02/2016 1509   GLUCOSE 167 (H) 10/14/2019 0409   GLUCOSE 153 (H) 11/02/2016 1509   BUN 44 (H) 10/14/2019 0409   BUN 16.7 11/02/2016  1509   CREATININE 1.25 (H) 10/14/2019 0409   CREATININE 0.9 11/02/2016 1509   CALCIUM 7.1 (L) 10/14/2019 0409   CALCIUM 9.1 11/02/2016 1509   PROT 4.7 (L) 10/14/2019 0409   PROT 7.0 11/02/2016 1509   ALBUMIN 2.3 (L) 10/14/2019 0409   ALBUMIN 3.6 11/02/2016 1509   AST 21 10/14/2019 0409   AST 15 11/02/2016 1509   ALT 17 10/14/2019 0409   ALT 12 11/02/2016 1509   ALKPHOS 33 (L) 10/14/2019 0409   ALKPHOS 58 11/02/2016 1509   BILITOT 0.9 10/14/2019 0409   BILITOT 0.71 11/02/2016 1509   GFRNONAA 53 (L) 10/14/2019 0409   GFRAA >60 10/14/2019 0409   Lipase     Component Value Date/Time   LIPASE 44 10/12/2019 0320       Studies/Results: DG Chest 1 View  Result Date: 10/13/2019 CLINICAL DATA:  Status post PICC placement. EXAM: CHEST  1 VIEW COMPARISON:  Single-view of the chest today at 4:31 a.m. FINDINGS: Endotracheal tube and NG tube are again seen. The patient has a new left PICC with the tip projecting in the lower superior vena cava. Right pleural effusion and basilar airspace disease persist. There is discoid atelectasis in the left mid lung.  No pneumothorax. Heart size is upper normal. IMPRESSION: Tip of new left PICC projects in the lower superior vena cava. Endotracheal tube and NG tube are unchanged. No change in a small right pleural effusion and basilar atelectasis. Electronically Signed   By: Inge Rise M.D.   On: 10/13/2019 19:01   DG Chest Port 1 View  Result Date: 10/13/2019 CLINICAL DATA:  Respiratory failure EXAM: PORTABLE CHEST 1 VIEW COMPARISON:  Yesterday FINDINGS: Endotracheal tube with tip at the clavicular heads. The enteric tube at least reaches the diaphragm. Right IJ line with tip at the upper cavoatrial junction. Low volume chest with increased atelectasis. No visible effusion or pneumothorax. Very distorted heart size due to rotation. IMPRESSION: 1. Unremarkable hardware positioning. 2. Increased atelectasis. Electronically Signed   By: Monte Fantasia  M.D.   On: 10/13/2019 05:11   DG CHEST PORT 1 VIEW  Result Date: 10/12/2019 CLINICAL DATA:  Status post endotracheal tube placement. EXAM: PORTABLE CHEST 1 VIEW COMPARISON:  December 09, 2006. FINDINGS: The heart size and mediastinal contours are within normal limits. Endotracheal tube appears to be in grossly good position. Right internal jugular catheter is noted with distal tip in expected position of cavoatrial junction. Nasogastric tube is seen entering stomach. No pneumothorax is noted. Left lung is clear. Mild right basilar subsegmental atelectasis is noted with probable small right pleural effusion. The visualized skeletal structures are unremarkable. IMPRESSION: Endotracheal tube in grossly good position. Mild right basilar subsegmental atelectasis is noted with probable small right pleural effusion. No pneumothorax is noted. Electronically Signed   By: Marijo Conception M.D.   On: 10/12/2019 19:20   ECHOCARDIOGRAM COMPLETE  Result Date: 10/13/2019    ECHOCARDIOGRAM REPORT   Patient Name:   SANTI TROUNG Date of Exam: 10/13/2019 Medical Rec #:  [id number]  Height:       71.0 in Accession #:    [id number] Weight:       212.5 lb Date of Birth:  [birth year]  BSA:          2.164 m Patient Age:    86 years   BP:           115/39 mmHg Patient Gender: M          HR:           80 bpm. Exam Location:  Inpatient Procedure: 2D Echo, Cardiac Doppler, Color Doppler and Intracardiac            Opacification Agent Indications:    R94.31 Abnormal EKG  History:        Patient has no prior history of Echocardiogram examinations.                 Risk Factors:Hypertension, Diabetes, Dyslipidemia and GERD.                 Cancer.  Sonographer:    Tiffany Dance Referring Phys: [id number] Donita Brooks  Sonographer Comments: Echo performed with patient supine and on artificial respirator, suboptimal parasternal window and no subcostal window. IMPRESSIONS  1. Image quality is poor. There appears to be regional wall motion variability,  but confident wall motion analysis is impossible, even after contrast administration. Left ventricular ejection fraction, by estimation, is 35 to 40%. The left ventricle has  moderately decreased function. The left ventricle demonstrates regional wall motion abnormalities. Left ventricular diastolic parameters are consistent with Grade I diastolic dysfunction (impaired relaxation).  2. Right ventricular systolic function is moderately reduced. The right ventricular size  is mildly enlarged. Tricuspid regurgitation signal is inadequate for assessing PA pressure.  3. The mitral valve is normal in structure. No evidence of mitral valve regurgitation.  4. The aortic valve was not well visualized. Aortic valve regurgitation is not visualized. No aortic stenosis is present. Comparison(s): No prior Echocardiogram. FINDINGS  Left Ventricle: Image quality is poor. There appears to be regional wall motion variability, but confident wall motion analysis is impossible, even after contrast administration. Left ventricular ejection fraction, by estimation, is 35 to 40%. The left ventricle has moderately decreased function. The left ventricle demonstrates regional wall motion abnormalities. Definity contrast agent was given IV to delineate the left ventricular endocardial borders. The left ventricular internal cavity size was normal in size. There is no left ventricular hypertrophy. Left ventricular diastolic parameters are consistent with Grade I diastolic dysfunction (impaired relaxation). Normal left ventricular filling pressure. Right Ventricle: The right ventricular size is mildly enlarged. No increase in right ventricular wall thickness. Right ventricular systolic function is moderately reduced. Tricuspid regurgitation signal is inadequate for assessing PA pressure. Left Atrium: Left atrial size was normal in size. Right Atrium: Right atrial size was normal in size. Pericardium: There is no evidence of pericardial effusion.  Mitral Valve: The mitral valve is normal in structure. No evidence of mitral valve regurgitation. Tricuspid Valve: The tricuspid valve is not well visualized. Tricuspid valve regurgitation is not demonstrated. Aortic Valve: The aortic valve was not well visualized. Aortic valve regurgitation is not visualized. No aortic stenosis is present. Pulmonic Valve: The pulmonic valve was not well visualized. Pulmonic valve regurgitation is not visualized. Aorta: The aortic root is normal in size and structure. IAS/Shunts: The interatrial septum was not assessed.   Diastology LV e' lateral:   11.00 cm/s LV E/e' lateral: 4.2 LV e' medial:    5.77 cm/s LV E/e' medial:  8.0  RIGHT VENTRICLE RV Basal diam:  2.00 cm RV S prime:     11.60 cm/s TAPSE (M-mode): 1.5 cm LEFT ATRIUM           Index       RIGHT ATRIUM          Index LA Vol (A2C): 26.8 ml 12.39 ml/m RA Area:     6.72 cm                                   RA Volume:   9.56 ml  4.42 ml/m  AORTIC VALVE LVOT Vmax:   74.80 cm/s LVOT Vmean:  46.800 cm/s LVOT VTI:    0.120 m MITRAL VALVE MV Area (PHT): 2.26 cm    SHUNTS MV Decel Time: 335 msec    Systemic VTI: 0.12 m MV E velocity: 46.00 cm/s MV A velocity: 67.00 cm/s MV E/A ratio:  0.69 Mihai Croitoru MD Electronically signed by Sanda Klein MD Signature Date/Time: 10/13/2019/5:14:25 PM    Final    [place] EKG SITE RITE  Result Date: 10/13/2019 If Site Rite image not attached, placement could not be confirmed due to current cardiac rhythm.   Anti-infectives: Anti-infectives (From admission, onward)   Start     Dose/Rate Route Frequency Ordered Stop   10/12/19 2000  piperacillin-tazobactam (ZOSYN) IVPB 3.375 g     Discontinue     3.375 g 12.5 mL/hr over 240 Minutes Intravenous Every 8 hours 10/12/19 1911     10/12/19 1708  ceFAZolin (ANCEF) 2-4 GM/100ML-% IVPB  Note to Pharmacy: Bennye Alm   : cabinet override      10/12/19 1708 10/12/19 1849       Assessment/Plan Remote hx of prostate cancer s/p  radiation HTN HLD GERD DM2 VDRF VT on amio drip Hyperglycemia - SSI Hypokalemia  ABL anemia - Hgb 12.4 > 10.4. Hold chemical prophylaxis until hgb stabilized   Protein calorie malnutrition - Pre-alb < 5. TPN  Perforated Duodenal Ulcer S/p Exploratory laparotomy, omental patch repair of duodenal ulcer - Dr. [name] - 10/12/2019 - POD #2 - Maintain JP drain. Bile in drain. Likely residual from contamination from perforation  - TPN - Continue NGT. Will need UGI prior to removal.  - We will follow along closely. Appreciate CCM assistance   FEN - NPO, NGT, IVF, TPN VTE - SCDs ID - Zosyn Foley - in place Follow-Up - Dr. [name]   LOS: 2 days    Jillyn Ledger , [name] Surgery 10/14/2019, 7:56 AM Please see Amion for pager number during day hours 7:00am-4:30pm

## 2019-10-15 ENCOUNTER — Inpatient Hospital Stay (HOSPITAL_COMMUNITY): Payer: Medicare Other

## 2019-10-15 DIAGNOSIS — I429 Cardiomyopathy, unspecified: Secondary | ICD-10-CM

## 2019-10-15 DIAGNOSIS — I472 Ventricular tachycardia: Secondary | ICD-10-CM

## 2019-10-15 DIAGNOSIS — R778 Other specified abnormalities of plasma proteins: Secondary | ICD-10-CM

## 2019-10-15 LAB — CBC WITH DIFFERENTIAL/PLATELET
Abs Immature Granulocytes: 0.08 10*3/uL — ABNORMAL HIGH (ref 0.00–0.07)
Basophils Absolute: 0 10*3/uL (ref 0.0–0.1)
Basophils Relative: 0 %
Eosinophils Absolute: 0.1 10*3/uL (ref 0.0–0.5)
Eosinophils Relative: 2 %
HCT: 30 % — ABNORMAL LOW (ref 39.0–52.0)
Hemoglobin: 9.9 g/dL — ABNORMAL LOW (ref 13.0–17.0)
Immature Granulocytes: 1 %
Lymphocytes Relative: 5 %
Lymphs Abs: 0.5 10*3/uL — ABNORMAL LOW (ref 0.7–4.0)
MCH: 26.1 pg (ref 26.0–34.0)
MCHC: 33 g/dL (ref 30.0–36.0)
MCV: 79.2 fL — ABNORMAL LOW (ref 80.0–100.0)
Monocytes Absolute: 0.8 10*3/uL (ref 0.1–1.0)
Monocytes Relative: 9 %
Neutro Abs: 7.3 10*3/uL (ref 1.7–7.7)
Neutrophils Relative %: 83 %
Platelets: 154 10*3/uL (ref 150–400)
RBC: 3.79 MIL/uL — ABNORMAL LOW (ref 4.22–5.81)
RDW: 17.4 % — ABNORMAL HIGH (ref 11.5–15.5)
WBC: 8.8 10*3/uL (ref 4.0–10.5)
nRBC: 0 % (ref 0.0–0.2)

## 2019-10-15 LAB — GLUCOSE, CAPILLARY
Glucose-Capillary: 151 mg/dL — ABNORMAL HIGH (ref 70–99)
Glucose-Capillary: 156 mg/dL — ABNORMAL HIGH (ref 70–99)
Glucose-Capillary: 170 mg/dL — ABNORMAL HIGH (ref 70–99)
Glucose-Capillary: 219 mg/dL — ABNORMAL HIGH (ref 70–99)
Glucose-Capillary: 169 mg/dL — ABNORMAL HIGH (ref 70–99)
Glucose-Capillary: 184 mg/dL — ABNORMAL HIGH (ref 70–99)

## 2019-10-15 LAB — COMPREHENSIVE METABOLIC PANEL
ALT: 18 U/L (ref 0–44)
AST: 28 U/L (ref 15–41)
Albumin: 2 g/dL — ABNORMAL LOW (ref 3.5–5.0)
Alkaline Phosphatase: 43 U/L (ref 38–126)
Anion gap: 7 (ref 5–15)
BUN: 44 mg/dL — ABNORMAL HIGH (ref 8–23)
CO2: 20 mmol/L — ABNORMAL LOW (ref 22–32)
Calcium: 7.1 mg/dL — ABNORMAL LOW (ref 8.9–10.3)
Chloride: 108 mmol/L (ref 98–111)
Creatinine, Ser: 1.08 mg/dL (ref 0.61–1.24)
GFR calc Af Amer: >60 mL/min (ref >60)
GFR calc non Af Amer: >60 mL/min (ref >60)
Glucose, Bld: 168 mg/dL — ABNORMAL HIGH (ref 70–99)
Potassium: 4.1 mmol/L (ref 3.5–5.1)
Sodium: 135 mmol/L (ref 135–145)
Total Bilirubin: 0.9 mg/dL (ref 0.3–1.2)
Total Protein: 4.8 g/dL — ABNORMAL LOW (ref 6.5–8.1)

## 2019-10-15 LAB — BLOOD GAS, ARTERIAL
Acid-Base Excess: 0.8 mmol/L (ref 0.0–2.0)
Bicarbonate: 25.1 mmol/L (ref 20.0–28.0)
FIO2: 80
O2 Saturation: 91.4 %
Patient temperature: 98
pCO2 arterial: 40.7 mmHg (ref 32.0–48.0)
pH, Arterial: 7.406 (ref 7.350–7.450)
pO2, Arterial: 60.7 mmHg — ABNORMAL LOW (ref 83.0–108.0)

## 2019-10-15 LAB — MAGNESIUM: Magnesium: 2.6 mg/dL — ABNORMAL HIGH (ref 1.7–2.4)

## 2019-10-15 LAB — PHOSPHORUS: Phosphorus: 2.4 mg/dL — ABNORMAL LOW (ref 2.5–4.6)

## 2019-10-15 LAB — TRIGLYCERIDES: Triglycerides: 104 mg/dL (ref <150)

## 2019-10-15 LAB — TROPONIN I (HIGH SENSITIVITY): Troponin I (High Sensitivity): 659 ng/L (ref <18)

## 2019-10-15 MED ORDER — METOPROLOL TARTRATE 12.5 MG HALF TABLET: 12.5000 mg | ORAL_TABLET | Freq: Four times a day (QID) | ORAL | Status: DC

## 2019-10-15 MED ORDER — HEPARIN SODIUM (PORCINE) 5000 UNIT/ML IJ SOLN
2000.0000 [IU] | Freq: Once | INTRAMUSCULAR | Status: AC
  Administered 2019-10-16: 2000 [IU] via INTRAVENOUS
  Filled 2019-10-15: qty 1

## 2019-10-15 MED ORDER — FUROSEMIDE 10 MG/ML IJ SOLN
INTRAMUSCULAR | Status: AC
  Filled 2019-10-15: qty 4

## 2019-10-15 MED ORDER — METOPROLOL TARTRATE 5 MG/5ML IV SOLN
INTRAVENOUS | Status: AC
  Filled 2019-10-15: qty 5

## 2019-10-15 MED ORDER — FUROSEMIDE 10 MG/ML IJ SOLN
40.0000 mg | Freq: Once | INTRAMUSCULAR | Status: AC
  Administered 2019-10-15: 40 mg via INTRAVENOUS

## 2019-10-15 MED ORDER — FUROSEMIDE 10 MG/ML IJ SOLN
40.0000 mg | Freq: Once | INTRAMUSCULAR | Status: AC
  Administered 2019-10-15: 40 mg via INTRAVENOUS
  Filled 2019-10-15: qty 4

## 2019-10-15 MED ORDER — ENOXAPARIN SODIUM 40 MG/0.4ML ~~LOC~~ SOLN
40.0000 mg | SUBCUTANEOUS | Status: DC
  Administered 2019-10-15: 40 mg via SUBCUTANEOUS
  Filled 2019-10-15: qty 0.4

## 2019-10-15 MED ORDER — ORAL CARE MOUTH RINSE
15.0000 mL | Freq: Two times a day (BID) | OROMUCOSAL | Status: DC
  Administered 2019-10-15 – 2019-11-02 (×34): 15 mL via OROMUCOSAL

## 2019-10-15 MED ORDER — NITROGLYCERIN 2 % TD OINT
1.0000 [in_us] | TOPICAL_OINTMENT | Freq: Four times a day (QID) | TRANSDERMAL | Status: DC
  Administered 2019-10-15 – 2019-11-08 (×94): 1 [in_us] via TOPICAL
  Filled 2019-10-15 (×3): qty 30

## 2019-10-15 MED ORDER — CALCIUM GLUCONATE-NACL 1-0.675 GM/50ML-% IV SOLN
1.0000 g | Freq: Once | INTRAVENOUS | Status: AC
  Administered 2019-10-15: 1000 mg via INTRAVENOUS
  Filled 2019-10-15: qty 50

## 2019-10-15 MED ORDER — HEPARIN (PORCINE) 25000 UT/250ML-% IV SOLN
1150.0000 [IU]/h | INTRAVENOUS | Status: DC
  Administered 2019-10-16 – 2019-10-19 (×4): 1150 [IU]/h via INTRAVENOUS
  Filled 2019-10-15 (×4): qty 250

## 2019-10-15 MED ORDER — METOPROLOL TARTRATE 5 MG/5ML IV SOLN
2.5000 mg | Freq: Four times a day (QID) | INTRAVENOUS | Status: DC
  Administered 2019-10-15 – 2019-10-16 (×3): 2.5 mg via INTRAVENOUS
  Filled 2019-10-15 (×3): qty 5

## 2019-10-15 MED ORDER — SODIUM PHOSPHATES 45 MMOLE/15ML IV SOLN
15.0000 mmol | Freq: Once | INTRAVENOUS | Status: AC
  Administered 2019-10-15: 15 mmol via INTRAVENOUS
  Filled 2019-10-15: qty 5

## 2019-10-15 MED ORDER — ASPIRIN 300 MG RE SUPP
300.0000 mg | Freq: Once | RECTAL | Status: AC
  Administered 2019-10-15: 300 mg via RECTAL
  Filled 2019-10-15: qty 1

## 2019-10-15 MED ORDER — TRAVASOL 10 % IV SOLN
INTRAVENOUS | Status: AC
  Filled 2019-10-15: qty 1208.4

## 2019-10-15 MED ORDER — SODIUM CHLORIDE 0.9 % IV SOLN: INTRAVENOUS | Status: DC

## 2019-10-15 MED ORDER — FENTANYL CITRATE (PF) 100 MCG/2ML IJ SOLN
50.0000 ug | INTRAMUSCULAR | Status: DC | PRN
  Administered 2019-10-17 – 2019-10-19 (×11): 50 ug via INTRAVENOUS
  Filled 2019-10-15 (×12): qty 2

## 2019-10-15 MED ORDER — METOPROLOL TARTRATE 5 MG/5ML IV SOLN
5.0000 mg | Freq: Once | INTRAVENOUS | Status: AC
  Administered 2019-10-15: 5 mg via INTRAVENOUS

## 2019-10-15 NOTE — Procedures (Signed)
Extubation Procedure Note  Patient Details:   Name: Matthew Schwartz DOB: [birth year] MRN: [id number]   Airway Documentation:    Vent end date: 10/15/19 Vent end time: 0835   Evaluation  O2 sats: stable throughout Complications: No apparent complications Patient did tolerate procedure well. Bilateral Breath Sounds: Clear, Diminished   Yes  Johnette Abraham 10/15/2019, 8:35 AM

## 2019-10-15 NOTE — Progress Notes (Signed)
NAME:  Matthew Schwartz, MRN:  [id number], DOB:  1932-05-14, LOS: 3 ADMISSION DATE:  10/12/2019, CONSULTATION DATE:  10/12/2019 REFERRING MD:  Dr. [name], Surgery, CHIEF COMPLAINT:  Abdominal pain  Brief History   86 y/o male, former smoker, presented with nausea/vomiting, abdominal pain and constipation x 4 to 5 days.  Seen twice prior to current admit for the same with imaging studies unremarkable for cause.  On return 6/14, seen by GI and found to have concern for acute abdomen and taken for exploratory laparotomy.  Found to have perforated duodenal ulcer s/p omental patch repair.  Remained on vent post op.  PCCM consulted for critical care management.  Past Medical History  Prostate cancer, HTN, GERD, HLD, DM  Significant Hospital Events   6/14 Admit, exploratory laparotomy, omental patch repair of duodenal ulcer 6/16 Off vasopressors, less drainage from abd drain / clearing   Consults:  Gastroenterology General surgery Cardiology  Procedures:  Rt IJ CVL 6/14 >> 6/15 Rt radial a line 6/14 >> ETT 6/14 >> LUE PICC 6/16 >>   Significant Diagnostic Tests:   CT angio abd/pelvis 6/13 >> mild atherosclerosis, no significant arterial stenosis of mesenteric vasculature, no findings of bowel ischemia, colonic diverticulosis w/o diverticulitis, b/l renal cysts, 12 mm lesion upper pole Rt kidney  ECHO 6/15 >> poor image quality, appears to be regional wall motion variability but confident wall motion analysis is impossible even after contrast administration, LVEF ~ 40-98%, Grade I diastolic dysfunction, RV systolic function is moderately reduced, RV is mildly enlarged  Micro Data:  SARS CoV2 6/13 >> negative UC 6/14 >> less than 10k insignificant growth   Antimicrobials:  Zosyn 6/14 >>  Interim history/subjective:  Tolerating pressure support.  Objective   Blood pressure (!) 155/38, pulse 78, temperature 97.9 F (36.6 C), temperature source Axillary, resp. rate 16, height 5\' 11"  (1.803  m), weight 96.4 kg, SpO2 100 %.    Vent Mode: PRVC FiO2 (%):  [30 %] 30 % Set Rate:  [16 bmp] 16 bmp Vt Set:  [580 mL] 580 mL PEEP:  [5 cmH20] 5 cmH20 Plateau Pressure:  [14 cmH20-19 cmH20] 19 cmH20   Intake/Output Summary (Last 24 hours) at 10/15/2019 0801 Last data filed at 10/15/2019 0600 Gross per 24 hour  Intake 3300.37 ml  Output 790 ml  Net 2510.37 ml   Filed Weights   10/13/19 0923  Weight: 96.4 kg    Examination:  General - sedated Eyes - pupils reactive ENT - ETT in place Cardiac - regular rate/rhythm, no murmur Chest - decreased BS at bases, no wheeze Abdomen - wound dressing clean Extremities - 1+ edema Skin - no rashes Neuro - RASS 0   Resolved Hospital Problem list   Septic shock  Assessment & Plan:   Peritonitis in setting of perforated duodenal ulcer s/p omental patch repair. - day 4 of zosyn - post operative care per surgery - continue protonix BID  - surgery planning for barium study on POD 5 - TPN per surgery  Acute respiratory failure with hypoxia in setting of sepsis and peritonitis. - might be ready for extubation trial - lasix 40 mg IV x one on 6/17 - f/u CXR  NSVT. Acute systolic CHF in setting of sepsis likely from demand ischemia. Hx of CAD, HTN, HLD. - continue amiodarone - should be able to add CHF medications at this point >> will need to be IV medications for now; defer to cardiology - hold home norvasc, imdur, toprol, altace, ASA, lipitor  DM type II poorly controlled with hyperglycemia. - SSI - hold home metformin   Anemia of critical illness. - f/u CBC - transfuse for Hb < 7 or significant bleeding  Best practice:  Diet: TPN DVT prophylaxis: lovenox GI prophylaxis: Protonix Mobility: Bed rest Code Status: full code Disposition: ICU  Labs    CMP Latest Ref Rng & Units 10/15/2019 10/14/2019 10/14/2019  Glucose 70 - 99 mg/dL 168(H) 359(H) 167(H)  BUN 8 - 23 mg/dL 44(H) 43(H) 44(H)  Creatinine 0.61 - 1.24 mg/dL  1.08 1.15 1.25(H)  Sodium 135 - 145 mmol/L 135 136 134(L)  Potassium 3.5 - 5.1 mmol/L 4.1 4.7 3.0(L)  Chloride 98 - 111 mmol/L 108 109 109  CO2 22 - 32 mmol/L 20(L) 21(L) 18(L)  Calcium 8.9 - 10.3 mg/dL 7.1(L) 6.9(L) 7.1(L)  Total Protein 6.5 - 8.1 g/dL 4.8(L) - 4.7(L)  Total Bilirubin 0.3 - 1.2 mg/dL 0.9 - 0.9  Alkaline Phos 38 - 126 U/L 43 - 33(L)  AST 15 - 41 U/L 28 - 21  ALT 0 - 44 U/L 18 - 17    CBC Latest Ref Rng & Units 10/15/2019 10/14/2019 10/13/2019  WBC 4.0 - 10.5 K/uL 8.8 7.0 4.1  Hemoglobin 13.0 - 17.0 g/dL 9.9(L) 10.4(L) 12.4(L)  Hematocrit 39 - 52 % 30.0(L) 31.3(L) 37.6(L)  Platelets 150 - 400 K/uL 154 161 211    ABG    Component Value Date/Time   PHART 7.440 10/13/2019 0311   PCO2ART 30.2 (L) 10/13/2019 0311   PO2ART 107 10/13/2019 0311   HCO3 20.2 10/13/2019 0311   TCO2 22 10/12/2019 1717   ACIDBASEDEF 2.6 (H) 10/13/2019 0311   O2SAT 97.5 10/13/2019 0311    CBG (last 3)  Recent Labs    10/14/19 2328 10/15/19 0336 10/15/19 0753  GLUCAP 146* 151* 156*    Critical care time: 34 minutes  Chesley Mires, MD [place] Pager - [phone] - [phone] 10/15/2019, 8:08 AM

## 2019-10-15 NOTE — Progress Notes (Signed)
ANTICOAGULATION CONSULT NOTE - Initial Consult  Pharmacy Consult for Heparin Indication: chest pain/ACS  Allergies  Allergen Reactions  . Losartan Potassium Other (See Comments)    Unknown reaction that happened 15 years ago    Patient Measurements: Height: 5\' 11"  (180.3 cm) (per daughter) Weight: 96.4 kg (212 lb 8.4 oz) IBW/kg (Calculated) : 75.3 Heparin Dosing Weight:   Vital Signs: Temp: 98.5 F (36.9 C) (06/17 2101) Temp Source: Axillary (06/17 2101) BP: 135/59 (06/17 2332) Pulse Rate: 75 (06/17 2332)  Labs: Recent Labs    10/13/19 0230 10/13/19 0230 10/13/19 0459 10/13/19 1014 10/14/19 0409 10/14/19 1400 10/15/19 0339 10/15/19 2124  HGB 12.4*   < >  --   --  10.4*  --  9.9*  --   HCT 37.6*  --   --   --  31.3*  --  30.0*  --   PLT 211  --   --   --  161  --  154  --   CREATININE 1.19   < >  --   --  1.25* 1.15 1.08  --   TROPONINIHS 67*   < > 64* 55*  --   --   --  659*   < > = values in this interval not displayed.    Estimated Creatinine Clearance: 60.3 mL/min (by C-G formula based on SCr of 1.08 mg/dL).   Medical History: Past Medical History:  Diagnosis Date  . Diabetes mellitus without complication ([place])   . Dyslipidemia   . GERD (gastroesophageal reflux disease)   . Hypertension   . Prostate cancer ([place]) 12/23/13   Adenocarcinoma  . PSA elevation   . S/P radiation therapy 05/18/2014 through 07/16/2014    Prostate 7800 cGy in 40 sessions, seminal vesicles 5600 cGy in 40 sessions     Medications:  Infusions:  . sodium chloride    . amiodarone Stopped (10/15/19 1853)  . [START ON 10/16/2019] heparin    . piperacillin-tazobactam (ZOSYN)  IV 3.375 g (10/15/19 2132)  . TPN ADULT (ION) 95 mL/hr at 10/15/19 1914    Assessment: Patient with elevated troponin.  MD asking pharmacy to dose heparin for ACS.  Prior enoxaparin charted 6/17 ~11am.  Will reduce heparin bolus due to  prior enoxaparin.  Goal of Therapy:  Heparin level 0.3-0.7 units/ml Monitor platelets by anticoagulation protocol: Yes   Plan:  Heparin bolus 2000 units iv x1 Heparin drip at 1150 units/hr Daily CBC Next heparin level at 0900    Tyler Deis, Shea Stakes [place] 10/15/2019,11:48 PM

## 2019-10-15 NOTE — Progress Notes (Signed)
Around 2015, pt began having frequent beats of VT on the monitor while on amio gtt. Elink notified immediately by this RN, and EKG was taken. By 2030, pt was having up to 15 beat runs of VT. Pt c/o increased WOB while on 25 L heated high flow. Pt denied chest pain. RR 30, BP 202/72, HR was 102 bpm.   Dr. Lucile Shutters camera'd into patient's room and reviewed EKG; showed occasional premature ventricular complexes, T wave abnormality, and prolonged QT. (Medications were reviewed for QTI) 5 mg lopressor ordered as well as 1 inch Nitro cream. Troponins obtained. Bipap was applied by RT, and 40 mg lasix was also given for WOB and crackles. ABG was obtained; unremarkable. Per orders, 300 mg ASA suppository given, and pharmacy heparin consult placed. This RN will continue to carefully monitor patient and response to interventions.

## 2019-10-15 NOTE — Progress Notes (Signed)
3 Days Post-Op    CC: Abdominal pain  Subjective: Patient stable this AM.  Dr. Halford Chessman is weaning him off the vent if possible later today.  He is off pressors again this AM, and blood pressure is stable.  Midline surgical wound looks good.  The JP drain currently looks mostly serous to me.  There may be a hint of green in there but mostly yellow serous fluid.  No bowel sounds present.  I irrigated the NG with air in both ports.  There is minimal drainage in the NG when I got there, but with irrigation there is some flow.  This also looks green.  Objective: Vital signs in last 24 hours: Temp:  [97.9 F (36.6 C)-99.3 F (37.4 C)] 97.9 F (36.6 C) (06/17 0354) Pulse Rate:  [73-85] 78 (06/17 0301) Resp:  [14-18] 16 (06/17 0301) BP: (142-171)/(38-57) 155/38 (06/17 0301) SpO2:  [99 %-100 %] 100 % (06/17 0301) Arterial Line BP: (104-188)/(41-110) 188/56 (06/16 2000) FiO2 (%):  [30 %] 30 % (06/17 0301) Last BM Date: 10/13/19 3300 IV 750 urine 40 from the drain No BM recorded T-max 99.3, vital signs are stable Patient still on the ventilator FiO2 at 30% Currently off pressors Still on amiodarone drip K+ 4.1, creatinine 1.08 Glucose 168 WBC 8.8 H/H 9.9/30 Platelets 154,000  Intake/Output from previous day: 06/16 0701 - 06/17 0700 In: 3300.4 [I.V.:2337.1; IV Piggyback:963.3] Out: 790 [Urine:750; Drains:40] Intake/Output this shift: No intake/output data recorded.  General appearance: alert, cooperative and He is following [place] and appears to understand Resp: clear to auscultation bilaterally and Anterior exam GI: Abdomen is not distended.,  No bowel sounds.  Midline incision looks good.  JP drain fluid looks mostly serous this AM. Extremities: extremities normal, atraumatic, no cyanosis or edema  Lab Results:  Recent Labs    10/14/19 0409 10/15/19 0339  WBC 7.0 8.8  HGB 10.4* 9.9*  HCT 31.3* 30.0*  PLT 161 154    BMET Recent Labs    10/14/19 1400 10/15/19 0339  NA 136  135  K 4.7 4.1  CL 109 108  CO2 21* 20*  GLUCOSE 359* 168*  BUN 43* 44*  CREATININE 1.15 1.08  CALCIUM 6.9* 7.1*   PT/INR No results for input(s): LABPROT, INR in the last 72 hours.  Recent Labs  Lab 10/11/19 1209 10/12/19 0320 10/13/19 0230 10/14/19 0409 10/15/19 0339  AST 31 32 _0 ALT _1 ALKPHOS 55 46 26* 33* 43  BILITOT 1.5* 1.6* 1.9* 0.9 0.9  PROT 7.0 6.2* 4.8* 4.7* 4.8*  ALBUMIN 3.9 3.4* 2.9* 2.3* 2.0*     Lipase     Component Value Date/Time   LIPASE 44 10/12/2019 0320     Medications: . chlorhexidine gluconate (MEDLINE KIT)  15 mL Mouth Rinse BID  . Chlorhexidine Gluconate Cloth  6 each Topical Daily  . insulin aspart  0-15 Units Subcutaneous Q4H  . mouth rinse  15 mL Mouth Rinse 10 times per day  . pantoprazole (PROTONIX) IV  40 mg Intravenous Q12H  . sodium chloride flush  10-40 mL Intracatheter Q12H   . sodium chloride Stopped (10/14/19 0834)  . amiodarone 30 mg/hr (10/15/19 0400)  . fentaNYL infusion INTRAVENOUS 175 mcg/hr (10/15/19 0400)  . piperacillin-tazobactam (ZOSYN)  IV 12.5 mL/hr at 10/15/19 0400  . TPN ADULT (ION) 75 mL/hr at 10/15/19 0400    Assessment/Plan Remote hx of prostate cancer s/p radiation HTN - off pressors HLD GERD - Protonix  DM2 VDRF - remains on Vent VT  - on amio drip Hyperglycemia - SSI Hypokalemia  ABL anemia - Hgb 12.4 > 10.4. Hold chemical prophylaxis until hgb stabilized   Severe Protein calorie malnutrition - Pre-alb < 5. TPN  Perforated Duodenal Ulcer S/p Exploratory laparotomy, omental patch repair of duodenal ulcer - Dr. Blackman - 10/12/2019 - POD #3 - Maintain JP drain. Bile in drain. Likely residual from contamination from perforation  - TPN - Continue NGT. Will need UGI prior to removal.  - We will follow along closely. Appreciate CCM assistance  FEN -NPO, NGT, IVF, TPN VTE - SCDs ID -Zosyn 6/14 >> day 4 Foley - in place Follow-Up - Dr. Blackman  Plan: Continue current  treatment.  Dr. Sood aims to try and extubate later today.  Twice daily dressing changes, continue antibiotics, continue NG tube.  He has daily labs ordered, CXR is pending this AM.       LOS: 3 days    [name],[name] 10/15/2019 Please see Amion   

## 2019-10-15 NOTE — Progress Notes (Signed)
eLink Physician-Brief Progress Note Patient Name: Shaunak Kreis DOB: [birth year] MRN: [id number]   Date of Service  10/15/2019  HPI/Events of Note  Accelerated hypertension, ? ST segment elevation, respiratory distress,  Pt denies chest pain, frequent ectopy.  eICU Interventions  Lopressor 5 mg iv x 1, Nitropaste 1 inch to the chest, BIPAP mask , ABG , 12 lead EKG, Aspirin 325 mg rectally, Pt will be Heparinized if ST elevation confirmed on EKG, stat portable CXR r/o pulmonary edema, Lasix 40 mg iv x 1, cycle Troponin.        [name] Lometa Riggin 10/15/2019, 9:07 PM

## 2019-10-15 NOTE — Progress Notes (Signed)
CRITICAL VALUE ALERT  Critical Value:  Troponin 659  Date & Time Notied:  2246, 10/15/19  Provider Notified: Warren Lacy RN  Orders Received/Actions taken: Awaiting orders at this time; This RN will continue to carefully monitor pt.

## 2019-10-15 NOTE — Progress Notes (Signed)
Abg drawn wile pt on bipap, settings 13/5, 80%.  Results for BELDON, NOWLING (MRN [id number]) as of 10/15/2019 21:42  Ref. Range 10/15/2019 21:12  FIO2 Unknown 80.00  pH, Arterial Latest Ref Range: 7.35 - 7.45  7.406  pCO2 arterial Latest Ref Range: 32 - 48 mmHg 40.7  pO2, Arterial Latest Ref Range: 83 - 108 mmHg 60.7 (L)  Acid-Base Excess Latest Ref Range: 0.0 - 2.0 mmol/L 0.8  Bicarbonate Latest Ref Range: 20.0 - 28.0 mmol/L 25.1  O2 Saturation Latest Units: % 91.4  Patient temperature Unknown 98.0  Allens test (pass/fail) Latest Ref Range: PASS  PASS

## 2019-10-15 NOTE — Progress Notes (Signed)
PHARMACY - TOTAL PARENTERAL NUTRITION CONSULT NOTE   Indication: Prolonged ileus  Patient Measurements: Height: 5\' 11"  (180.3 cm) (per daughter) Weight: 96.4 kg (212 lb 8.4 oz) IBW/kg (Calculated) : 75.3   Body mass index is 29.64 kg/m. Usual Weight: 90.7 kg   Assessment:  - Pharmacy is consulted to start TPN in 86 yo male with prolonged ileus. Pt underwent exploratory laparotomy on 6/14 which revealed perforated duodenal ulcer.   Glucose / Insulin: hx of DM - on metformin PTA.  - CBGs mostly well controlled on mSSI q4h (range 143-161; goal 100-150) - 16 units of insulin yesterday Electrolytes: K (target > 4.0) is 4.1. Mg ( target > 2.0)  remains elevated at 2.6 but is trending down; phos now low; Corr Ca is 8.7, slightly low, Na borderline low, but has remained stable Renal: SCr trending down, BUN elevated; bicarb decreased, UOP low LFTs / TGs: LFTs remain WNL, Tbili improved to WNL after procedure; TG are now WNL after stopping propofol. Previously elevated level likely spurious  Prealbumin / albumin: prealbumin undetectable; albumin 2.3 ( 6/16) 2.0 ( 6/17)  Intake / Output; MIVF: +11.3 L since admission; on NS at 35 ml/hr GI Imaging:  Surgeries / Procedures:  - 6/14 exploratory laparotomy on 6/14 which revealed perforated duodenal ulcer.    Central access:  PICC Line 6/15 TPN start date: 6/15  Nutritional Goals: Pending RD recs  (per RD recommendation on 6/15): Kcal:  2200-2400 Protein:  115-125 grams Fluid:  >2L/d Goal TPN rate is 95 mL/hr (provides 121 g of protein and 2262 kcals per day)    Current Nutrition:  NPO    Plan:   Sodium Phosphate 15  mmol IV x 1  Calcium gluconate 1g IV x 1     Advance TPN to 95 mL/hr at 1800, the target rate   Will resume lipids as propofol stopped and TG levels are WNL   Electrolytes in TPN: 54mEq/L of Na, 65 mEq/L of K, 7 mEq/L of Ca, 0 mEq/L of Mg, and 37mmol/L of Phos. Cl:Ac ratio max Ac  Standard MVI and trace  elements to TPN  Continue Moderate q4h SSI and adjust as needed   Decrease MIVF to KVO mL/hr at 1800  BMP, magnesium, phosphate with AM labs   Monitor TPN labs on Mon/Thurs;      Royetta Asal, PharmD, BCPS 10/15/2019 10:03 AM

## 2019-10-15 NOTE — Progress Notes (Addendum)
Progress Note  Patient Name: Matthew Schwartz Date of Encounter: 10/15/2019  [facility] Cardiologist: Candee Furbish, MD   Subjective   Pt awake on the vent, daughter [name] in the room. Pt denies chest pain, no SOB  Inpatient Medications    Scheduled Meds: . chlorhexidine gluconate (MEDLINE KIT)  15 mL Mouth Rinse BID  . Chlorhexidine Gluconate Cloth  6 each Topical Daily  . insulin aspart  0-15 Units Subcutaneous Q4H  . mouth rinse  15 mL Mouth Rinse 10 times per day  . pantoprazole (PROTONIX) IV  40 mg Intravenous Q12H  . sodium chloride flush  10-40 mL Intracatheter Q12H   Continuous Infusions: . sodium chloride Stopped (10/14/19 0834)  . amiodarone 30 mg/hr (10/15/19 0400)  . fentaNYL infusion INTRAVENOUS 175 mcg/hr (10/15/19 0400)  . piperacillin-tazobactam (ZOSYN)  IV 12.5 mL/hr at 10/15/19 0400  . TPN ADULT (ION) 75 mL/hr at 10/15/19 0400   PRN Meds: acetaminophen **OR** acetaminophen, fentaNYL, fentaNYL (SUBLIMAZE) injection, midazolam, ondansetron (ZOFRAN) IV, sodium chloride flush   Vital Signs    Vitals:   10/14/19 1946 10/14/19 2300 10/15/19 0301 10/15/19 0354  BP: (!) 166/51  (!) 155/38   Pulse: 78  78   Resp: 16  16   Temp:  99.2 F (37.3 C)  97.9 F (36.6 C)  TempSrc:  Axillary  Axillary  SpO2: 100%  100%   Weight:      Height:        Intake/Output Summary (Last 24 hours) at 10/15/2019 0740 Last data filed at 10/15/2019 0600 Gross per 24 hour  Intake 3300.37 ml  Output 790 ml  Net 2510.37 ml   Last 3 Weights 10/13/2019 10/09/2019 07/22/2019  Weight (lbs) 212 lb 8.4 oz 200 lb 206 lb  Weight (kg) 96.4 kg 90.719 kg 93.441 kg      Telemetry    SR, PVCs and pairs w/ multiple 3 bt runs slow VT (approx 130)  - Personally Reviewed  ECG    No new  - Personally Reviewed  Physical Exam   GEN: No acute distress on the vent   Neck: JVD 9 cm on the R Cardiac: RRR, no murmur, no rubs, or gallops.  Respiratory: diminished to auscultation with rales in  the bases. GI: Soft, nontender, non-distended  MS: No edema; No deformity. Neuro:  Not tested due to pt on the vent  Labs    High Sensitivity Troponin:   Recent Labs  Lab 10/12/19 0446 10/13/19 0230 10/13/19 0459 10/13/19 1014  TROPONINIHS 48* 67* 64* 55*      Chemistry Recent Labs  Lab 10/13/19 0230 10/13/19 0230 10/14/19 0409 10/14/19 1400 10/15/19 0339  NA 136   < > 134* 136 135  K 3.5   < > 3.0* 4.7 4.1  CL 106   < > 109 109 108  CO2 19*   < > 18* 21* 20*  GLUCOSE 178*   < > 167* 359* 168*  BUN 37*   < > 44* 43* 44*  CREATININE 1.19   < > 1.25* 1.15 1.08  CALCIUM 7.3*   < > 7.1* 6.9* 7.1*  PROT 4.8*  --  4.7*  --  4.8*  ALBUMIN 2.9*  --  2.3*  --  2.0*  AST 31  --  21  --  28  ALT 25  --  17  --  18  ALKPHOS 26*  --  33*  --  43  BILITOT 1.9*  --  0.9  --  0.9  GFRNONAA 56*   < > 53* 58* >60  GFRAA >60   < > >60 >60 >60  ANIONGAP 11   < > '7 6 7   ' < > = values in this interval not displayed.    Magnesium  Date Value Ref Range Status  10/15/2019 2.6 (H) 1.7 - 2.4 mg/dL Final    Comment:    Performed at [facility], [place] [address] [place], [place] [address]  10/14/2019 3.2 (H) 1.7 - 2.4 mg/dL Final    Comment:    Performed at [facility], [place] [address], [place], [place] [address]  10/13/2019 3.4 (H) 1.7 - 2.4 mg/dL Final    Comment:    Performed at [facility], [place] [address] [place], [place] [address]  10/12/2019 2.6 (H) 1.7 - 2.4 mg/dL Final    Comment:    Performed at [facility], [place] [address], [place], [place] [address]    Hematology Recent Labs  Lab 10/13/19 0230 10/14/19 0409 10/15/19 0339  WBC 4.1 7.0 8.8  RBC 4.68 3.90* 3.79*  HGB 12.4* 10.4* 9.9*  HCT 37.6* 31.3* 30.0*  MCV 80.3 80.3 79.2*  MCH 26.5 26.7 26.1  MCHC 33.0 33.2 33.0  RDW 17.2* 17.3* 17.4*  PLT 211 161 154    BNPNo results for input(s): BNP, PROBNP in the last 168 hours.     Radiology    DG Chest 1 View  Result Date: 10/13/2019 CLINICAL DATA:  Status post PICC placement. EXAM: CHEST  1 VIEW COMPARISON:  Single-view of the chest today at 4:31 a.m. FINDINGS: Endotracheal tube and NG tube are again seen. The patient has a new left PICC with the tip projecting in the lower superior vena cava. Right pleural effusion and basilar airspace disease persist. There is discoid atelectasis in the left mid lung. No pneumothorax. Heart size is upper normal. IMPRESSION: Tip of new left PICC projects in the lower superior vena cava. Endotracheal tube and NG tube are unchanged. No change in a small right pleural effusion and basilar atelectasis. Electronically Signed   By: Inge Rise M.D.   On: 10/13/2019 19:01   DG CHEST PORT 1 VIEW  Result Date: 10/14/2019 CLINICAL DATA:  Intubation EXAM: PORTABLE CHEST 1 VIEW COMPARISON:  10/13/2019 FINDINGS: Endotracheal tube and NG tube unchanged. Elevation of RIGHT hemidiaphragm. Small bilateral pleural effusions and basilar atelectasis. Platelike atelectasis in the LEFT mid lung. No pneumothorax. No pulmonary edema. IMPRESSION: 1. No significant interval change. 2. Bibasilar atelectasis and small effusions. Electronically Signed   By: Suzy Bouchard M.D.   On: 10/14/2019 08:27   ECHOCARDIOGRAM COMPLETE  Result Date: 10/13/2019    ECHOCARDIOGRAM REPORT   Patient Name:   Matthew Schwartz Date of Exam: 10/13/2019 Medical Rec #:  [id number]  Height:       71.0 in Accession #:    [id number] Weight:       212.5 lb Date of Birth:  25-Nov-1932  BSA:          2.164 m Patient Age:    86 years   BP:           115/39 mmHg Patient Gender: M          HR:           80 bpm. Exam Location:  Inpatient Procedure: 2D Echo, Cardiac Doppler, Color Doppler and Intracardiac            Opacification Agent Indications:  R94.31 Abnormal EKG  History:        Patient has no prior history of Echocardiogram examinations.                 Risk Factors:Hypertension, Diabetes,  Dyslipidemia and GERD.                 Cancer.  Sonographer:    Tiffany Dance Referring Phys: [id number] Donita Brooks  Sonographer Comments: Echo performed with patient supine and on artificial respirator, suboptimal parasternal window and no subcostal window. IMPRESSIONS  1. Image quality is poor. There appears to be regional wall motion variability, but confident wall motion analysis is impossible, even after contrast administration. Left ventricular ejection fraction, by estimation, is 35 to 40%. The left ventricle has  moderately decreased function. The left ventricle demonstrates regional wall motion abnormalities. Left ventricular diastolic parameters are consistent with Grade I diastolic dysfunction (impaired relaxation).  2. Right ventricular systolic function is moderately reduced. The right ventricular size is mildly enlarged. Tricuspid regurgitation signal is inadequate for assessing PA pressure.  3. The mitral valve is normal in structure. No evidence of mitral valve regurgitation.  4. The aortic valve was not well visualized. Aortic valve regurgitation is not visualized. No aortic stenosis is present. Comparison(s): No prior Echocardiogram. FINDINGS  Left Ventricle: Image quality is poor. There appears to be regional wall motion variability, but confident wall motion analysis is impossible, even after contrast administration. Left ventricular ejection fraction, by estimation, is 35 to 40%. The left ventricle has moderately decreased function. The left ventricle demonstrates regional wall motion abnormalities. Definity contrast agent was given IV to delineate the left ventricular endocardial borders. The left ventricular internal cavity size was normal in size. There is no left ventricular hypertrophy. Left ventricular diastolic parameters are consistent with Grade I diastolic dysfunction (impaired relaxation). Normal left ventricular filling pressure. Right Ventricle: The right ventricular size is mildly  enlarged. No increase in right ventricular wall thickness. Right ventricular systolic function is moderately reduced. Tricuspid regurgitation signal is inadequate for assessing PA pressure. Left Atrium: Left atrial size was normal in size. Right Atrium: Right atrial size was normal in size. Pericardium: There is no evidence of pericardial effusion. Mitral Valve: The mitral valve is normal in structure. No evidence of mitral valve regurgitation. Tricuspid Valve: The tricuspid valve is not well visualized. Tricuspid valve regurgitation is not demonstrated. Aortic Valve: The aortic valve was not well visualized. Aortic valve regurgitation is not visualized. No aortic stenosis is present. Pulmonic Valve: The pulmonic valve was not well visualized. Pulmonic valve regurgitation is not visualized. Aorta: The aortic root is normal in size and structure. IAS/Shunts: The interatrial septum was not assessed.   Diastology LV e' lateral:   11.00 cm/s LV E/e' lateral: 4.2 LV e' medial:    5.77 cm/s LV E/e' medial:  8.0  RIGHT VENTRICLE RV Basal diam:  2.00 cm RV S prime:     11.60 cm/s TAPSE (M-mode): 1.5 cm LEFT ATRIUM           Index       RIGHT ATRIUM          Index LA Vol (A2C): 26.8 ml 12.39 ml/m RA Area:     6.72 cm                                   RA Volume:   9.56 ml  4.42 ml/m  AORTIC VALVE LVOT Vmax:   74.80 cm/s LVOT Vmean:  46.800 cm/s LVOT VTI:    0.120 m MITRAL VALVE MV Area (PHT): 2.26 cm    SHUNTS MV Decel Time: 335 msec    Systemic VTI: 0.12 m MV E velocity: 46.00 cm/s MV A velocity: 67.00 cm/s MV E/A ratio:  0.69 Mihai Croitoru MD Electronically signed by Sanda Klein MD Signature Date/Time: 10/13/2019/5:14:25 PM    Final    [place] EKG SITE RITE  Result Date: 10/13/2019 If Site Rite image not attached, placement could not be confirmed due to current cardiac rhythm.   Cardiac Studies   ECHO 10/13/19 IMPRESSIONS  1. Image quality is poor. There appears to be regional wall motion  variability, but  confident wall motion analysis is impossible, even after  contrast administration. Left ventricular ejection fraction, by  estimation, is 35 to 40%. The left ventricle has  moderately decreased function. The left ventricle demonstrates regional  wall motion abnormalities. Left ventricular diastolic parameters are  consistent with Grade I diastolic dysfunction (impaired relaxation).  2. Right ventricular systolic function is moderately reduced. The right  ventricular size is mildly enlarged. Tricuspid regurgitation signal is  inadequate for assessing PA pressure.  3. The mitral valve is normal in structure. No evidence of mitral valve  regurgitation.  4. The aortic valve was not well visualized. Aortic valve regurgitation  is not visualized. No aortic stenosis is present.   Comparison(s): No prior Echocardiogram.   FINDINGS  Left Ventricle: Image quality is poor. There appears to be regional wall  motion variability, but confident wall motion analysis is impossible, even  after contrast administration. Left ventricular ejection fraction, by  estimation, is 35 to 40%. The left  ventricle has moderately decreased function. The left ventricle  demonstrates regional wall motion abnormalities. Definity contrast agent  was given IV to delineate the left ventricular endocardial borders. The  left ventricular internal cavity size was  normal in size. There is no left ventricular hypertrophy. Left ventricular  diastolic parameters are consistent with Grade I diastolic dysfunction  (impaired relaxation). Normal left ventricular filling pressure.   Right Ventricle: The right ventricular size is mildly enlarged. No  increase in right ventricular wall thickness. Right ventricular systolic  function is moderately reduced. Tricuspid regurgitation signal is  inadequate for assessing PA pressure.   Left Atrium: Left atrial size was normal in size.   Right Atrium: Right atrial size was normal  in size.   Pericardium: There is no evidence of pericardial effusion.   Mitral Valve: The mitral valve is normal in structure. No evidence of  mitral valve regurgitation.   Tricuspid Valve: The tricuspid valve is not well visualized. Tricuspid  valve regurgitation is not demonstrated.   Aortic Valve: The aortic valve was not well visualized. Aortic valve  regurgitation is not visualized. No aortic stenosis is present.   Pulmonic Valve: The pulmonic valve was not well visualized. Pulmonic valve  regurgitation is not visualized.   Aorta: The aortic root is normal in size and structure.   IAS/Shunts: The interatrial septum was not assessed.      Patient Profile     86 y.o. male  with a hx of CAD with PTCA 2000 to RCA, DM, HTN, HLD now admitted with perforated duodenal ulcer and peritonitis with NSVT and abnormal EKG. New CM.    Assessment & Plan    1. NSVT - in setting of acute illness, keep K+ >4.0 and Mg > 2.0 - continue amio  2. abnormal EKG on presentation with deep T wave inversions - mild trop elevation - EF now 35-40% w/ u/a to eval for WMA - ischemic eval once pt improves - BP/HR are improved, will start low-dose metoprolol, was on Toprol XL 50 mg bid pta 3. New cardiomyopathy and RV systolic function reduced   - I/O net +11.3 L since admit, UOP is poor  - discuss daily Lasix dose w/ MD 4. CAD with hx of PTCA RCA 2000 - no further details available  - ASA and statin held on admit 5. Sepsis due to peritonitis with hypotension - was on 2 pressors, now off and SBP > 140 last 24 hr 6. Acute respiratory failure post op - per CCM, still intubated and sedated   7. Pleural effusion on CXR Rt > Lt - s/p Lasix 40 mg IV x 1 on 06/16  8. Hypokalemia - K+ 3.0 on 06/15>>s/p supplement and improved 9. Acute ABD with perforated duodenal ulcer and emergent exploratory lap and s/p omental patch repair. - ABX and mgt per CCX & CCM       For questions or updates, please  contact [place] Please consult [email or web address] for contact info under        Signed, Rosaria Ferries, PA-C  10/15/2019, 7:40 AM    Patient seen and examined with Rosaria Ferries PA-C.  Agree as above, with the following exceptions and changes as noted below. He continues to improve, was extubated earlier today and is more responsive. Significant HTN. Gen: NAD, CV: RRR, no murmurs, Lungs: clear, Abd: soft and TTP, Extrem: Warm, well perfused, no edema, Neuro/Psych: alert, speaks one word answers or questions. All available labs, radiology testing, previous records reviewed. Recommend PRN BB and hydralazine for HTN. Agree with oral metoprolol if able to administer with NGT. Consider coronary angiography if heparinizing is felt to be safe from post operative standpoint. Consider lasix today if BP remains stable.  Elouise Munroe, MD  CRITICAL CARE Performed by: Cherlynn Kaiser, MD   Total critical care time: 35 minutes   Critical care time was exclusive of separately billable procedures and treating other patients.   Critical care was necessary to treat or prevent imminent or life-threatening deterioration.   Critical care was time spent personally by me (independent of APPs or residents) on the following activities: development of treatment plan with patient and/or surrogate as well as nursing, discussions with consultants, evaluation of patient's response to treatment, examination of patient, obtaining history from patient or surrogate, ordering and performing treatments and interventions, ordering and review of laboratory studies, ordering and review of radiographic studies, pulse oximetry and re-evaluation of patient's condition.

## 2019-10-15 NOTE — Progress Notes (Signed)
Nutrition Follow-up  DOCUMENTATION CODES:   Not applicable  INTERVENTION:  - continue TPN per Pharmacist. - diet advancement as medically feasible.  NUTRITION DIAGNOSIS:   Inadequate oral intake related to inability to eat as evidenced by NPO status -ongoing  GOAL:   Patient will meet greater than or equal to 90% of their needs -met with TPN regimen  MONITOR:   Diet advancement, Labs, Weight trends, Other (Comment) (TPN regimen)  ASSESSMENT:   Pt admitted with obstipation. PMH includes prostate cancer s/p radiation, HTN, HLD, GERD, T2DM.  Significant Events: 6/14- admission; intubation; NGT placement (gastric); ex lap with omental patch repair of perforated duodenal ulcer 6/15- initial RD assessment; triple lumen PICC placed in L basilic; TPN initiation   Patient was extubated today. NGT remains in place to LIS with scant amount of output. Surgery team note states plan for UGI prior to NGT removal (possibly 6/18).   Patient remains NPO post-extubation and is receiving custom TPN at 75 ml/hr with order to increase to 95 ml/hr today at 1800. This rate is goal rate and will provide 2262 kcal, 121 grams protein.     Labs reviewed; CBGs: 151 and 156 mg/dl, BUN: 44 mg/dl, Ca: 7.1 mg/dl, Phos: 2.4 mg/dl, Mg: 2.6 mg/dl. Medications reviewed; 40 mg IV lasix x1 dose 6/17, sliding scale novolog, 40 mg IV protonix BID, 10 mEq IV KCl x4 runs 6/16, 30 mmol IV KPhos x1 run 6/16, 15 mmol IV NaPhos x1 run 6/17. IVF; NS @ 35 ml/hr.   Diet Order:   Diet Order            Diet NPO time specified  Diet effective now                 EDUCATION NEEDS:   Not appropriate for education at this time  Skin:  Skin Assessment: Skin Integrity Issues: Skin Integrity Issues:: Incisions Incisions: abdomen  Last BM:  6/15  Height:   Ht Readings from Last 1 Encounters:  10/13/19 '5\' 11"'  (1.803 m)    Weight:   Wt Readings from Last 1 Encounters:  10/13/19 96.4 kg     Estimated  Nutritional Needs:   Kcal:  2200-2400  Protein:  115-125 grams  Fluid:  >2L/d     Jarome Matin, MS, RD, LDN, CNSC Inpatient Clinical Dietitian RD pager # available in [name]  After hours/weekend pager # available in [facility], [place]

## 2019-10-16 ENCOUNTER — Inpatient Hospital Stay (HOSPITAL_COMMUNITY): Payer: Medicare Other

## 2019-10-16 DIAGNOSIS — I5021 Acute systolic (congestive) heart failure: Secondary | ICD-10-CM

## 2019-10-16 LAB — CBC WITH DIFFERENTIAL/PLATELET
Abs Immature Granulocytes: 0.13 10*3/uL — ABNORMAL HIGH (ref 0.00–0.07)
Basophils Absolute: 0 10*3/uL (ref 0.0–0.1)
Basophils Relative: 0 %
Eosinophils Absolute: 0.1 10*3/uL (ref 0.0–0.5)
Eosinophils Relative: 1 %
HCT: 31.6 % — ABNORMAL LOW (ref 39.0–52.0)
Hemoglobin: 10.6 g/dL — ABNORMAL LOW (ref 13.0–17.0)
Immature Granulocytes: 1 %
Lymphocytes Relative: 5 %
Lymphs Abs: 0.5 10*3/uL — ABNORMAL LOW (ref 0.7–4.0)
MCH: 26.2 pg (ref 26.0–34.0)
MCHC: 33.5 g/dL (ref 30.0–36.0)
MCV: 78.2 fL — ABNORMAL LOW (ref 80.0–100.0)
Monocytes Absolute: 1 10*3/uL (ref 0.1–1.0)
Monocytes Relative: 11 %
Neutro Abs: 7.7 10*3/uL (ref 1.7–7.7)
Neutrophils Relative %: 82 %
Platelets: 144 10*3/uL — ABNORMAL LOW (ref 150–400)
RBC: 4.04 MIL/uL — ABNORMAL LOW (ref 4.22–5.81)
RDW: 17.2 % — ABNORMAL HIGH (ref 11.5–15.5)
WBC: 9.4 10*3/uL (ref 4.0–10.5)
nRBC: 0.2 % (ref 0.0–0.2)

## 2019-10-16 LAB — TYPE AND SCREEN
ABO/RH(D): B POS
Antibody Screen: NEGATIVE
Unit division: 0
Unit division: 0

## 2019-10-16 LAB — COMPREHENSIVE METABOLIC PANEL
ALT: 31 U/L (ref 0–44)
AST: 33 U/L (ref 15–41)
Albumin: 2 g/dL — ABNORMAL LOW (ref 3.5–5.0)
Alkaline Phosphatase: 71 U/L (ref 38–126)
Anion gap: 9 (ref 5–15)
BUN: 36 mg/dL — ABNORMAL HIGH (ref 8–23)
CO2: 25 mmol/L (ref 22–32)
Calcium: 7.7 mg/dL — ABNORMAL LOW (ref 8.9–10.3)
Chloride: 104 mmol/L (ref 98–111)
Creatinine, Ser: 0.93 mg/dL (ref 0.61–1.24)
GFR calc Af Amer: >60 mL/min (ref >60)
GFR calc non Af Amer: >60 mL/min (ref >60)
Glucose, Bld: 268 mg/dL — ABNORMAL HIGH (ref 70–99)
Potassium: 3.6 mmol/L (ref 3.5–5.1)
Sodium: 138 mmol/L (ref 135–145)
Total Bilirubin: 0.9 mg/dL (ref 0.3–1.2)
Total Protein: 5.3 g/dL — ABNORMAL LOW (ref 6.5–8.1)

## 2019-10-16 LAB — GLUCOSE, CAPILLARY
Glucose-Capillary: 195 mg/dL — ABNORMAL HIGH (ref 70–99)
Glucose-Capillary: 221 mg/dL — ABNORMAL HIGH (ref 70–99)
Glucose-Capillary: 232 mg/dL — ABNORMAL HIGH (ref 70–99)
Glucose-Capillary: 234 mg/dL — ABNORMAL HIGH (ref 70–99)
Glucose-Capillary: 243 mg/dL — ABNORMAL HIGH (ref 70–99)
Glucose-Capillary: 256 mg/dL — ABNORMAL HIGH (ref 70–99)
Glucose-Capillary: 267 mg/dL — ABNORMAL HIGH (ref 70–99)

## 2019-10-16 LAB — BPAM RBC
Blood Product Expiration Date: 202107052359
Blood Product Expiration Date: 202107052359
Unit Type and Rh: 7300
Unit Type and Rh: 7300

## 2019-10-16 LAB — HEPARIN LEVEL (UNFRACTIONATED)
Heparin Unfractionated: <0.1 IU/mL — ABNORMAL LOW (ref 0.30–0.70)
Heparin Unfractionated: 0.32 IU/mL (ref 0.30–0.70)
Heparin Unfractionated: 0.34 IU/mL (ref 0.30–0.70)

## 2019-10-16 LAB — ECHOCARDIOGRAM LIMITED
Height: 71 in
Weight: 3400.38 oz

## 2019-10-16 LAB — MAGNESIUM: Magnesium: 1.9 mg/dL (ref 1.7–2.4)

## 2019-10-16 LAB — TROPONIN I (HIGH SENSITIVITY)
Troponin I (High Sensitivity): 1022 ng/L (ref <18)
Troponin I (High Sensitivity): 871 ng/L (ref <18)
Troponin I (High Sensitivity): 776 ng/L (ref <18)

## 2019-10-16 LAB — PHOSPHORUS: Phosphorus: 2.5 mg/dL (ref 2.5–4.6)

## 2019-10-16 MED ORDER — SODIUM CHLORIDE 0.9 % IV SOLN
INTRAVENOUS | Status: DC | PRN
  Administered 2019-10-16 – 2019-10-20 (×2): 250 mL via INTRAVENOUS
  Administered 2019-10-31: 1000 mL via INTRAVENOUS
  Administered 2019-11-06 – 2019-11-22 (×3): 250 mL via INTRAVENOUS

## 2019-10-16 MED ORDER — MAGNESIUM SULFATE 2 GM/50ML IV SOLN
2.0000 g | Freq: Once | INTRAVENOUS | Status: AC
  Administered 2019-10-16: 2 g via INTRAVENOUS
  Filled 2019-10-16: qty 50

## 2019-10-16 MED ORDER — POTASSIUM CHLORIDE 10 MEQ/50ML IV SOLN
10.0000 meq | INTRAVENOUS | Status: AC
  Administered 2019-10-16 (×2): 10 meq via INTRAVENOUS
  Filled 2019-10-16 (×2): qty 50

## 2019-10-16 MED ORDER — METOPROLOL TARTRATE 5 MG/5ML IV SOLN
2.5000 mg | Freq: Once | INTRAVENOUS | Status: AC
  Administered 2019-10-16: 2.5 mg via INTRAVENOUS
  Filled 2019-10-16: qty 5

## 2019-10-16 MED ORDER — INSULIN DETEMIR 100 UNIT/ML ~~LOC~~ SOLN
10.0000 [IU] | Freq: Two times a day (BID) | SUBCUTANEOUS | Status: DC
  Administered 2019-10-16 – 2019-11-13 (×57): 10 [IU] via SUBCUTANEOUS
  Filled 2019-10-16 (×61): qty 0.1

## 2019-10-16 MED ORDER — FUROSEMIDE 10 MG/ML IJ SOLN
40.0000 mg | Freq: Four times a day (QID) | INTRAMUSCULAR | Status: AC
  Administered 2019-10-16 (×2): 40 mg via INTRAVENOUS
  Filled 2019-10-16 (×2): qty 4

## 2019-10-16 MED ORDER — METOPROLOL TARTRATE 5 MG/5ML IV SOLN
5.0000 mg | Freq: Four times a day (QID) | INTRAVENOUS | Status: DC
  Administered 2019-10-16 – 2019-10-17 (×5): 5 mg via INTRAVENOUS
  Filled 2019-10-16 (×5): qty 5

## 2019-10-16 MED ORDER — TRAVASOL 10 % IV SOLN
INTRAVENOUS | Status: AC
  Filled 2019-10-16: qty 1208.4

## 2019-10-16 MED ORDER — POTASSIUM PHOSPHATES 15 MMOLE/5ML IV SOLN
15.0000 mmol | Freq: Once | INTRAVENOUS | Status: AC
  Administered 2019-10-16: 15 mmol via INTRAVENOUS
  Filled 2019-10-16: qty 5

## 2019-10-16 NOTE — Progress Notes (Signed)
PHARMACY - TOTAL PARENTERAL NUTRITION CONSULT NOTE   Indication: Prolonged ileus  Patient Measurements: Height: 5\' 11"  (180.3 cm) (per daughter) Weight: 96.4 kg (212 lb 8.4 oz) IBW/kg (Calculated) : 75.3   Body mass index is 29.64 kg/m. Usual Weight: 90.7 kg   Assessment:  - Pharmacy is consulted to start TPN in 86 yo male with prolonged ileus. Pt underwent exploratory laparotomy on 6/14 which revealed perforated duodenal ulcer.   Glucose / Insulin: hx of DM - on metformin PTA.  - CBGs mostly well controlled on mSSI q4h (range 156-232; goal 100-150) - 24 units of insulin yesterday - Pt ordered Levemir 10 units BID per CCM 6/18  Electrolytes: K (target > 4.0) is 3.6 . Mg ( target > 2.0)  Previously elevated but now down to 1.9; phos WNL; Corr Ca is 9.3 WNL , Na WNL, Renal: SCr trending down, BUN elevated; bicarb decreased, UOP low LFTs / TGs: LFTs remain WNL, Tbili improved to WNL after procedure; TG are now WNL after stopping propofol. Previously elevated level likely spurious  Prealbumin / albumin: prealbumin undetectable; albumin 2.3 ( 6/16) 2.0 ( 6/17, 6/18)  Intake / Output; MIVF: - 3816 mL yesterday after two doses of Lasix; on NS at [place]:  Surgeries / Procedures:  - 6/14 exploratory laparotomy on 6/14 which revealed perforated duodenal ulcer.    Central access:  PICC Line 6/15 TPN start date: 6/15  Nutritional Goals: Pending RD recs  (per RD recommendation on 6/15): Kcal:  2200-2400 Protein:  115-125 grams Fluid:  >2L/d Goal TPN rate is 95 mL/hr (provides 121 g of protein and 2262 kcals per day)    Current Nutrition:  NPO    Plan:   Potassium Phosphate 15  mmol IV x 1  Potassium chloride 10 meq IV x 2   Magnesium 2 gr IV x1      Continue TPN to 95 mL/hr at 1800, the target rate   Electrolytes in TPN: 21mEq/L of Na, 65 mEq/L of K, 7 mEq/L of Ca, 5 mEq/L of Mg, and 47mmol/L of Phos. Cl:Ac ratio max Ac  Standard MVI and trace elements to  TPN  Continue Moderate q4h SSI, and monitor newly started Levemir and adjust as needed   Continue MIVF to KVO mL/hr at 1800  BMP, magnesium, phosphate with AM labs   Monitor TPN labs on Mon/Thurs;      Royetta Asal, PharmD, BCPS 10/16/2019 9:34 AM

## 2019-10-16 NOTE — Progress Notes (Signed)
[place] Progress Note Patient Name: Matthew Schwartz DOB: March 26, 1933 MRN: [id number]   Date of Service  10/16/2019  HPI/Events of Note  Blood pressure remains unacceptably high, troponin 871, intermittent ectopy.  eICU Interventions  Lopressor 2.5 mg iv x 1, then Lopressor increased to 5 mg iv Q 6 hours with hold parameters of heart rate < 60 or SBP < 110.        [name] Nami Strawder 10/16/2019, 4:05 AM

## 2019-10-16 NOTE — Progress Notes (Signed)
NAME:  Matthew Schwartz, MRN:  [id number], DOB:  03-30-1933, LOS: 4 ADMISSION DATE:  10/12/2019, CONSULTATION DATE:  10/12/2019 REFERRING MD:  Dr. [name], Surgery, CHIEF COMPLAINT:  Abdominal pain  Brief History   86 y/o male, former smoker, presented with nausea/vomiting, abdominal pain and constipation x 4 to 5 days.  Seen twice prior to current admit for the same with imaging studies unremarkable for cause.  On return 6/14, seen by GI and found to have concern for acute abdomen and taken for exploratory laparotomy.  Found to have perforated duodenal ulcer s/p omental patch repair.  Remained on vent post op.  PCCM consulted for critical care management.  Past Medical History  Prostate cancer, HTN, GERD, HLD, DM  Significant Hospital Events   6/14 Admit, exploratory laparotomy, omental patch repair of duodenal ulcer 6/16 Off vasopressors, less drainage from abd drain / clearing  6/17 extubated, weak cough, added high flow oxygen 6/18 start heparin gtt  Consults:  Gastroenterology General surgery Cardiology  Procedures:  Rt IJ CVL 6/14 >> 6/15 Rt radial a line 6/14 >> 6/17 ETT 6/14 >> 6/17 LUE PICC 6/16 >>   Significant Diagnostic Tests:   CT angio abd/pelvis 6/13 >> mild atherosclerosis, no significant arterial stenosis of mesenteric vasculature, no findings of bowel ischemia, colonic diverticulosis w/o diverticulitis, b/l renal cysts, 12 mm lesion upper pole Rt kidney  ECHO 6/15 >> poor image quality, appears to be regional wall motion variability but confident wall motion analysis is impossible even after contrast administration, LVEF ~ 19-50%, Grade I diastolic dysfunction, RV systolic function is moderately reduced, RV is mildly enlarged  Micro Data:  SARS CoV2 6/13 >> negative UC 6/14 >> less than 10k insignificant growth   Antimicrobials:  Zosyn 6/14 >>  Interim history/subjective:  Feels congested.  Having trouble coughing up phlegm consistently.  Denies chest pain.   Abdominal pain improving.  Objective   Blood pressure (!) 168/60, pulse 78, temperature 97.9 F (36.6 C), temperature source Oral, resp. rate (!) 22, height 5\' 11"  (1.803 m), weight 96.4 kg, SpO2 97 %. CVP:  [7 mmHg-13 mmHg] 7 mmHg  Vent Mode: BIPAP FiO2 (%):  [50 %-100 %] 50 % Set Rate:  [14 bmp] 14 bmp PEEP:  [5 cmH20] 5 cmH20   Intake/Output Summary (Last 24 hours) at 10/16/2019 9326 Last data filed at 10/16/2019 0900 Gross per 24 hour  Intake 2822.72 ml  Output 6720 ml  Net -3897.28 ml   Filed Weights   10/13/19 0923  Weight: 96.4 kg    Examination:  General - alert, appears tired Eyes - pupils reactive ENT - gurgling that partially clears with cough, no stridor Cardiac - regular rate/rhythm, no murmur Chest - Rt > Lt crackles Abdomen - soft, wound site clean Extremities - 1+ edema Skin - no rashes Neuro - moves extremities, follows commands   Resolved Hospital Problem list   Septic shock  Assessment & Plan:   Peritonitis in setting of perforated duodenal ulcer s/p omental patch repair. - day 5 of zosyn - surgery planning to check Upper GI on 6/19 - protonix BID - TPN per surgery  Acute respiratory failure with hypoxia in setting of sepsis and peritonitis. - extubated 6/17 - weak cough and splitting from abdominal pain after extubation - continue bronchial hygiene - unable to tolerate Bipap due to nausea - continue high flow oxygen - if he develops signs of respiratory muscle fatigue, would then need to proceed with re-intubation - lasix 40 mg IV q6h  x 2 doses on 6/18 - f/u CXR  NSVT. Acute systolic CHF in setting of sepsis likely from demand ischemia. Hx of CAD, HTN, HLD. - continue amiodarone, heparin gtt - IV lopressor, NTG patch - goal SBP < 160 - cardiology following; will need further ischemia work up when medically more stable - hold home norvasc, imdur, toprol, altace, ASA, lipitor until he is able to take pills  DM type II poorly controlled  with hyperglycemia. - SSI - add levemir 6/18 - hold home metformin   Anemia of critical illness. - f/u CBC - transfuse for Hb < 7 or significant bleeding  Best practice:  Diet: TPN DVT prophylaxis: heparin gtt GI prophylaxis: Protonix Mobility: Bed rest Code Status: full code Disposition: ICU  Updated pt's daughter at bedside  Labs    CMP Latest Ref Rng & Units 10/16/2019 10/15/2019 10/14/2019  Glucose 70 - 99 mg/dL 268(H) 168(H) 359(H)  BUN 8 - 23 mg/dL 36(H) 44(H) 43(H)  Creatinine 0.61 - 1.24 mg/dL 0.93 1.08 1.15  Sodium 135 - 145 mmol/L 138 135 136  Potassium 3.5 - 5.1 mmol/L 3.6 4.1 4.7  Chloride 98 - 111 mmol/L 104 108 109  CO2 22 - 32 mmol/L 25 20(L) 21(L)  Calcium 8.9 - 10.3 mg/dL 7.7(L) 7.1(L) 6.9(L)  Total Protein 6.5 - 8.1 g/dL 5.3(L) 4.8(L) -  Total Bilirubin 0.3 - 1.2 mg/dL 0.9 0.9 -  Alkaline Phos 38 - 126 U/L 71 43 -  AST 15 - 41 U/L 33 28 -  ALT 0 - 44 U/L 31 18 -    CBC Latest Ref Rng & Units 10/16/2019 10/15/2019 10/14/2019  WBC 4.0 - 10.5 K/uL 9.4 8.8 7.0  Hemoglobin 13.0 - 17.0 g/dL 10.6(L) 9.9(L) 10.4(L)  Hematocrit 39 - 52 % 31.6(L) 30.0(L) 31.3(L)  Platelets 150 - 400 K/uL 144(L) 154 161    ABG    Component Value Date/Time   PHART 7.406 10/15/2019 2112   PCO2ART 40.7 10/15/2019 2112   PO2ART 60.7 (L) 10/15/2019 2112   HCO3 25.1 10/15/2019 2112   TCO2 22 10/12/2019 1717   ACIDBASEDEF 2.6 (H) 10/13/2019 0311   O2SAT 91.4 10/15/2019 2112    CBG (last 3)  Recent Labs    10/16/19 0039 10/16/19 0404 10/16/19 0817  GLUCAP 221* 232* 195*    Critical care time: 39 minutes  Chesley Mires, MD [place] Pager - [phone] 10/16/2019, 9:28 AM

## 2019-10-16 NOTE — Progress Notes (Signed)
eLink Physician-Brief Progress Note Patient Name: Tomoki Lucken DOB: October 25, 1932 MRN: [id number]   Date of Service  10/16/2019  HPI/Events of Note  Rising Troponin r/o acute coronary syndrome vs demand.  eICU Interventions  Cardiology consulted.        [name] Jarrah Seher 10/16/2019, 12:41 AM

## 2019-10-16 NOTE — Progress Notes (Addendum)
Progress Note  Patient Name: Matthew Schwartz Date of Encounter: 10/16/2019  [facility] Cardiologist: Candee Furbish, MD   Subjective   I have discussed his care with Dr. Halford Chessman in critical care.  Mr. Colan is demonstrating difficulty clearing secretions and some increased work of breathing after extubation yesterday.  This escalated overnight, please see documentation from my colleague Dr. Marletta Lor.  He required BiPAP as well as IV beta-blocker and Nitropaste for hypertension.  Concern was raised for ST elevations, however STEMI was ruled out.  Troponins rose from [id number], and on initial hospital presentation troponins were only 40-60.  He intermittently demonstrated a left bundle branch pattern as well as subtle J-point changes.  He has had concerning T wave inversions since admission, and is noted on echocardiogram to have a newly reduced ejection fraction of 35-40%.  I have discussed his care thoroughly with his daughter [name] who is in the room, and discussed at the bedside with the patient as involved as he is able.  Inpatient Medications    Scheduled Meds: . Chlorhexidine Gluconate Cloth  6 each Topical Daily  . insulin aspart  0-15 Units Subcutaneous Q4H  . mouth rinse  15 mL Mouth Rinse BID  . metoprolol tartrate  5 mg Intravenous Q6H  . nitroGLYCERIN  1 inch Topical Q6H  . pantoprazole (PROTONIX) IV  40 mg Intravenous Q12H  . sodium chloride flush  10-40 mL Intracatheter Q12H   Continuous Infusions: . sodium chloride    . amiodarone 30 mg/hr (10/16/19 0544)  . heparin 1,150 Units/hr (10/16/19 0500)  . piperacillin-tazobactam (ZOSYN)  IV 3.375 g (10/16/19 0548)  . TPN ADULT (ION) 95 mL/hr at 10/16/19 0500   PRN Meds: acetaminophen **OR** acetaminophen, fentaNYL (SUBLIMAZE) injection, ondansetron (ZOFRAN) IV, sodium chloride flush   Vital Signs    Vitals:   10/16/19 0400 10/16/19 0500 10/16/19 0600 10/16/19 0742  BP: (!) 174/59 (!) 145/59 (!) 141/66   Pulse: 73 67 65   Resp:  (!) 28 (!) 23 (!) 24   Temp: 98.4 F (36.9 C)     TempSrc: Axillary     SpO2: 100% 98% 100% 100%  Weight:      Height:        Intake/Output Summary (Last 24 hours) at 10/16/2019 0805 Last data filed at 10/16/2019 0612 Gross per 24 hour  Intake 2878.74 ml  Output 6695 ml  Net -3816.26 ml   Last 3 Weights 10/13/2019 10/09/2019 07/22/2019  Weight (lbs) 212 lb 8.4 oz 200 lb 206 lb  Weight (kg) 96.4 kg 90.719 kg 93.441 kg      Telemetry    SR with PVCs - Personally Reviewed  ECG    See below with NSTEMI multiple done since last eval.  - Personally Reviewed  Physical Exam   GEN:  Mild increased work of breathing Neck: No JVD evident. Cardiac:  Regular rhythm, normal rate, distant heart sounds Respiratory:  Gurgling upper respiratory sounds GI: Soft, mildly tender, not tense. MS: No edema Neuro:  Nonfocal, alert to the conversation but unable to assess orientation. Psych: Unable to fully assess   Labs    High Sensitivity Troponin:   Recent Labs  Lab 10/13/19 1014 10/15/19 2124 10/15/19 2314 10/16/19 0237 10/16/19 0500  TROPONINIHS 55* 659* 1,022* 871* 776*      Chemistry Recent Labs  Lab 10/14/19 0409 10/14/19 0409 10/14/19 1400 10/15/19 0339 10/16/19 0231  NA 134*   < > 136 135 138  K 3.0*   < >  4.7 4.1 3.6  CL 109   < > 109 108 104  CO2 18*   < > 21* 20* 25  GLUCOSE 167*   < > 359* 168* 268*  BUN 44*   < > 43* 44* 36*  CREATININE 1.25*   < > 1.15 1.08 0.93  CALCIUM 7.1*   < > 6.9* 7.1* 7.7*  PROT 4.7*  --   --  4.8* 5.3*  ALBUMIN 2.3*  --   --  2.0* 2.0*  AST 21  --   --  28 33  ALT 17  --   --  18 31  ALKPHOS 33*  --   --  43 71  BILITOT 0.9  --   --  0.9 0.9  GFRNONAA 53*   < > 58* >60 >60  GFRAA >60   < > >60 >60 >60  ANIONGAP 7   < > 6 7 9    < > = values in this interval not displayed.     Hematology Recent Labs  Lab 10/14/19 0409 10/15/19 0339 10/16/19 0231  WBC 7.0 8.8 9.4  RBC 3.90* 3.79* 4.04*  HGB 10.4* 9.9* 10.6*  HCT 31.3*  30.0* 31.6*  MCV 80.3 79.2* 78.2*  MCH 26.7 26.1 26.2  MCHC 33.2 33.0 33.5  RDW 17.3* 17.4* 17.2*  PLT 161 154 144*    BNPNo results for input(s): BNP, PROBNP in the last 168 hours.   DDimer No results for input(s): DDIMER in the last 168 hours.   Radiology    DG Chest Port 1 View  Result Date: 10/15/2019 CLINICAL DATA:  Shortness of breath EXAM: PORTABLE CHEST 1 VIEW COMPARISON:  10/15/2019 FINDINGS: Endotracheal tube has been removed. NG tube is in the stomach. Layering bilateral effusions with bibasilar atelectasis or infiltrates, increasing since prior study. Cardiomegaly with vascular congestion. IMPRESSION: Interval extubation. Increasing layering bilateral effusions and bibasilar atelectasis or infiltrates. Vascular congestion. Electronically Signed   By: Rolm Baptise M.D.   On: 10/15/2019 21:43   DG CHEST PORT 1 VIEW  Result Date: 10/15/2019 CLINICAL DATA:  86 year old male with respiratory insufficiency. EXAM: PORTABLE CHEST 1 VIEW COMPARISON:  Portable chest 10/14/2019 and earlier. FINDINGS: Portable AP semi upright view at 1626 hours. Endotracheal tube tip at the level the clavicles. Enteric tube terminates in the stomach, side hole not clearly identified. Left PICC line remains in place. Paucity of bowel gas in the upper abdomen. Stable lung volumes with continued evidence of bilateral pleural effusions, larger on the right and small to moderate. Stable mild cardiomegaly and mediastinal contours. Perihilar atelectasis suspected. No pneumothorax. No pulmonary edema. Osteopenia. IMPRESSION: 1. Stable lines and tubes. 2. Stable bilateral pleural effusions, greater on the right. Perihilar atelectasis suspected. 3. No new cardiopulmonary abnormality. Electronically Signed   By: Genevie Ann M.D.   On: 10/15/2019 08:54    Cardiac Studies   ECHO 10/13/19 IMPRESSIONS  1. Image quality is poor. There appears to be regional wall motion  variability, but confident wall motion analysis is  impossible, even after  contrast administration. Left ventricular ejection fraction, by  estimation, is 35 to 40%. The left ventricle has  moderately decreased function. The left ventricle demonstrates regional  wall motion abnormalities. Left ventricular diastolic parameters are  consistent with Grade I diastolic dysfunction (impaired relaxation).  2. Right ventricular systolic function is moderately reduced. The right  ventricular size is mildly enlarged. Tricuspid regurgitation signal is  inadequate for assessing PA pressure.  3. The mitral valve is normal  in structure. No evidence of mitral valve  regurgitation.  4. The aortic valve was not well visualized. Aortic valve regurgitation  is not visualized. No aortic stenosis is present.   Comparison(s): No prior Echocardiogram.   FINDINGS  Left Ventricle: Image quality is poor. There appears to be regional wall  motion variability, but confident wall motion analysis is impossible, even  after contrast administration. Left ventricular ejection fraction, by  estimation, is 35 to 40%. The left  ventricle has moderately decreased function. The left ventricle  demonstrates regional wall motion abnormalities. Definity contrast agent  was given IV to delineate the left ventricular endocardial borders. The  left ventricular internal cavity size was  normal in size. There is no left ventricular hypertrophy. Left ventricular  diastolic parameters are consistent with Grade I diastolic dysfunction  (impaired relaxation). Normal left ventricular filling pressure.   Right Ventricle: The right ventricular size is mildly enlarged. No  increase in right ventricular wall thickness. Right ventricular systolic  function is moderately reduced. Tricuspid regurgitation signal is  inadequate for assessing PA pressure.   Left Atrium: Left atrial size was normal in size.   Right Atrium: Right atrial size was normal in size.   Pericardium: There is  no evidence of pericardial effusion.   Mitral Valve: The mitral valve is normal in structure. No evidence of  mitral valve regurgitation.   Tricuspid Valve: The tricuspid valve is not well visualized. Tricuspid  valve regurgitation is not demonstrated.   Aortic Valve: The aortic valve was not well visualized. Aortic valve  regurgitation is not visualized. No aortic stenosis is present.   Pulmonic Valve: The pulmonic valve was not well visualized. Pulmonic valve  regurgitation is not visualized.   Aorta: The aortic root is normal in size and structure.   IAS/Shunts: The interatrial septum was not assessed.    Patient Profile     86 y.o. male with a hx of CAD with PTCA 2000 to RCA, DM, HTN, HLDnow admitted with perforated duodenal ulcer and peritonitis, with cardiology consultation for NSVT and abnormal EKG. Echocardiogram demonstrates newly reduced ejection fraction 35 to 40%, and EKG with precordial T wave inversions.   Respiratory decompensation after extubation with hypertension and with elevated troponin 10/15/19, concerning for demand ischemia.   Assessment & Plan    1. NSTEMI 2. Acute systolic heart failure 3. CAD with hx of PTCA to RCA in year 2000, by report. Yesterday evening his troponins rose in the setting of respiratory decompensation requiring BiPAP.  He continues to have difficulty clearing secretions, and demonstrates increased work of breathing.  Elevation in troponins and intermittent left bundle branch block yesterday evening could still represent demand ischemia and a stress cardiomyopathic process in the setting of respiratory distress and resolving sepsis, as well as postoperative status.  However he has a history of coronary artery disease and it would be prudent to exclude obstructive CAD.  He was given rectal aspirin and started on IV heparin yesterday.  Ideally more time for postoperative healing would be optimal, however I believe coronary angiography on Monday  may be indicated.  I have shared these thoughts with the primary service who feel this is reasonable and will communicate with the surgeons as well for transfer to [facility] for closer intensive care monitoring and accessibility to cardiovascular invasive services.  Troponin is trending down. Troponin 659>>1022>>871>>776.  Throughout this hospital stay he has denied chest pain.  Chest x-ray showed bilateral pleural effusions and vascular congestion, may be secondary  to hypertensive episode.  He has been given Lasix by the primary service to improve respiratory status as well as overall net positive volume status (Net 6 L positive for admission).  Echocardiogram was obtained today for limited assessment of LV and RV function, essentially unchanged though image quality is challenging.  Recommend initiation of heart failure therapy when able.  Tentatively plan for coronary angiography on Monday.  Continue IV heparin in the absence of bleeding. Need to discuss DAPT with surgical team prior to potential PCI.  3.  Hypertension-BP has been significantly elevated which may be related to medical stressors and respiratory distress.  It has been challenging to give home medications due to NG to suction without opportunity for clamping.  IV beta-blocker, hydralazine have been given, blood pressure is mildly improved today but remains in the systolics 888K.  Recommend initiation of heart failure therapy for the benefit of systolic dysfunction as well as hypertension when able.   4. NSVT in acute setting with stable K+ and Mg+. Now on amiodarone and improved - continue amiodarone.  5. Sepsis due to peritonitis with hypotension, now off of vasopressors 6. Acute respiratory failure -as above.  Extubated 10/15/2019.  Required BiPAP overnight for increased work of breathing. 7. Acute ABD with perforated duodenal ulcer and emergent exploratory lap and s/p omental patch repair.on ABX per surgery and CCM    For  questions or updates, please contact [place] Please consult [email or web address] for contact info under        Signed, Elouise Munroe, MD 10/16/2019, 8:05 AM     CRITICAL CARE Performed by: Cherlynn Kaiser, MD   Total critical care time: 45 minutes   Critical care time was exclusive of separately billable procedures and treating other patients.   Critical care was necessary to treat or prevent imminent or life-threatening deterioration.   Critical care was time spent personally by me (independent of APPs or residents) on the following activities: development of treatment plan with patient and/or surrogate as well as nursing, discussions with consultants, evaluation of patient's response to treatment, examination of patient, obtaining history from patient or surrogate, ordering and performing treatments and interventions, ordering and review of laboratory studies, ordering and review of radiographic studies, pulse oximetry and re-evaluation of patient's condition.

## 2019-10-16 NOTE — Progress Notes (Signed)
Pharmacy Consult Note - IV heparin follow up  Labs: heparin level 0.34  A/P: Heparin level remains therapeutic (goal 0.3-07) on current IV heparin rate of 1150 units/hr. NO reported bleeding. Continue current IV heparin rate. Daily heparin level  Adrian Saran, PharmD, BCPS 10/16/2019 6:01 PM

## 2019-10-16 NOTE — Progress Notes (Signed)
Brief Cardiology Note:  Consulted by E-Link given rising troponin. Chart reviewed and cardiology consult note from 6/17 noted.  Mr. 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 Yesterday he was noted to have TWI on EKG with elevation in hsTn to 600s. Earlier in the night, per the RN, was noted to have some increased work of breathing. Denied CP at the time. Required escalation from [place] to HFNC and bipap. Also noted to have higher Bps requiring IV beta blocker and nitro paste. Was thought to have increased ST elevations on the monitors, but serial EKGS without acute STEMI.Continues to have some NSVT as noted. Repeat troponin drawn after episode above rising from 600 -> 1000. This had increased from 40s-60s earlier this admission. Noted EKGs during the day with LBBB pattern (QRS 130s) now with IVCD and QRS 110. Subtle J point changes from prior. TWI remain.   Hemodynamically unchanged. CCM had already given rectal ASA and started heparin for ACS at the time of consult. Spoke to RN who confirmed patient remains CP free, Bps and respiratory status now improved.   1. Abnormal EKG (intermittent LBBB with deep TWI), Rising troponin - No chest pain per report.  - Trop from 60s -> 600s from 6/15 to PM 6/17. Unclear trend in between.  - Continue to favor demand ischemia > ACS, but ischemic evaluation clearly warranted.  - EF now 35-40% - Please continue to cycle troponins q6 hours overnight.  - If continues to rise consider keeping NPO in case more urgent evaluation is warranted.  - Contact cardiology ASAP if hemodynamic changes.  - On low dose metoprolol IV (cannot take PO).  - Has received ASA and now on IV Heparin. Trend hemoglobin closely.   2. NSVT - in setting of acute illness, keep K+ >4.0 and Mg > 2.0 - continue amio   Cardiology team to follow closely in AM.  Discussed with  Dr. Lucile Shutters.   Matthew Dyar K. Marletta Lor, MD

## 2019-10-16 NOTE — Progress Notes (Signed)
PT Cancellation Note  Patient Details Name: Matthew Schwartz MRN: [id number] DOB: [birth year]   Cancelled Treatment:    Reason Eval/Treat Not Completed: Medical issues which prohibited therapy, mutiple medical issues,.   Claretha Cooper 10/16/2019, 9:07 AM [place] Pager [phone] Office [phone]

## 2019-10-16 NOTE — Progress Notes (Signed)
4 Days Post-Op    CC: Abdominal pain  Subjective: Patient stable this AM.  Complains of some SOB/dyspnea but overall feeling better each day. Denies n/v. Pain well controlled.  Objective: Vital signs in last 24 hours: Temp:  [98.4 F (36.9 C)-98.9 F (37.2 C)] 98.4 F (36.9 C) (06/18 0400) Pulse Rate:  [65-103] 65 (06/18 0600) Resp:  [22-33] 24 (06/18 0600) BP: (135-202)/(52-74) 141/66 (06/18 0600) SpO2:  [82 %-100 %] 100 % (06/18 0742) FiO2 (%):  [50 %-100 %] 50 % (06/18 0742) Last BM Date: 10/12/19  Intake/Output from previous day: 06/17 0701 - 06/18 0700 In: 2878.7 [I.V.:2424.6; IV Piggyback:454.2] Out: 6695 [Urine:5800; Emesis/NG output:800; Drains:95] Intake/Output this shift: No intake/output data recorded.  General appearance: alert, cooperative and in no distress Resp: coarse crackles bilaterally GI: Soft, nondistended, not significantly tender. JP with ss fluid in bulb Extremities: extremities normal, atraumatic, no cyanosis or edema  Lab Results:  Recent Labs    10/15/19 0339 10/16/19 0231  WBC 8.8 9.4  HGB 9.9* 10.6*  HCT 30.0* 31.6*  PLT 154 144*    BMET Recent Labs    10/15/19 0339 10/16/19 0231  NA 135 138  K 4.1 3.6  CL 108 104  CO2 20* 25  GLUCOSE 168* 268*  BUN 44* 36*  CREATININE 1.08 0.93  CALCIUM 7.1* 7.7*   PT/INR No results for input(s): LABPROT, INR in the last 72 hours.  Recent Labs  Lab 10/12/19 0320 10/13/19 0230 10/14/19 0409 10/15/19 0339 10/16/19 0231  AST 32 31 21 28  33  ALT 22 25 17 18 31   ALKPHOS 46 26* 33* 43 71  BILITOT 1.6* 1.9* 0.9 0.9 0.9  PROT 6.2* 4.8* 4.7* 4.8* 5.3*  ALBUMIN 3.4* 2.9* 2.3* 2.0* 2.0*     Lipase     Component Value Date/Time   LIPASE 44 10/12/2019 0320     Medications: . Chlorhexidine Gluconate Cloth  6 each Topical Daily  . insulin aspart  0-15 Units Subcutaneous Q4H  . mouth rinse  15 mL Mouth Rinse BID  . metoprolol tartrate  5 mg Intravenous Q6H  . nitroGLYCERIN  1 inch  Topical Q6H  . pantoprazole (PROTONIX) IV  40 mg Intravenous Q12H  . sodium chloride flush  10-40 mL Intracatheter Q12H   . sodium chloride    . amiodarone 30 mg/hr (10/16/19 0544)  . heparin 1,150 Units/hr (10/16/19 0500)  . piperacillin-tazobactam (ZOSYN)  IV 3.375 g (10/16/19 0548)  . TPN ADULT (ION) 95 mL/hr at 10/16/19 0500    Assessment/Plan Remote hx of prostate cancer s/p radiation HTN - off pressors HLD GERD - Protonix DM2 VDRF - remains on Vent VT  - on amio drip Hyperglycemia - SSI Hypokalemia  ABL anemia - Hgb 12.4 > 10.4. Hold chemical prophylaxis until hgb stabilized   Severe Protein calorie malnutrition - Pre-alb < 5. TPN  Perforated Duodenal Ulcer S/p Exploratory laparotomy, omental patch repair of duodenal ulcer - Dr. [name] - 10/12/2019 - POD #4 - Maintain JP drain. No further bile in drain.  - TPN - Continue NGT today; will plan UGI tomorrow to assess everything. If clear, will potentially clamp and allow clears if cleared for oral intake by primary service - We will follow along closely. Appreciate CCM assistance  FEN -NPO, NGT, IVF, TPN VTE - SCDs ID -Zosyn 6/14 >> day 5 Foley - in place Follow-Up - as per primary  Plan: Continue current treatment.       LOS: 4 days  Nadeen Landau, M.D. [facility] Use [email or web address] to contact on call provider

## 2019-10-16 NOTE — Progress Notes (Signed)
ANTICOAGULATION CONSULT NOTE - [place] for Heparin Indication: chest pain/ACS  Allergies  Allergen Reactions  . Losartan Potassium Other (See Comments)    Unknown reaction that happened 15 years ago    Patient Measurements: Height: 5\' 11"  (180.3 cm) (per daughter) Weight: 96.4 kg (212 lb 8.4 oz) IBW/kg (Calculated) : 75.3 Heparin Dosing Weight:   Vital Signs: Temp: 97.9 F (36.6 C) (06/18 0800) Temp Source: Oral (06/18 0800) BP: 151/96 (06/18 1000) Pulse Rate: 79 (06/18 1000)  Labs: Recent Labs    10/14/19 0409 10/14/19 0409 10/14/19 1400 10/15/19 0339 10/15/19 2124 10/15/19 2314 10/16/19 0231 10/16/19 0237 10/16/19 0500 10/16/19 0857  HGB 10.4*   < >  --  9.9*  --   --  10.6*  --   --   --   HCT 31.3*  --   --  30.0*  --   --  31.6*  --   --   --   PLT 161  --   --  154  --   --  144*  --   --   --   HEPARINUNFRC  --   --   --   --   --   --  <0.10*  --   --  0.32  CREATININE 1.25*   < > 1.15 1.08  --   --  0.93  --   --   --   TROPONINIHS  --   --   --   --    < > 1,022*  --  871* 776*  --    < > = values in this interval not displayed.    Estimated Creatinine Clearance: 70 mL/min (by C-G formula based on SCr of 0.93 mg/dL).   Medical History: Past Medical History:  Diagnosis Date  . Diabetes mellitus without complication ([place])   . Dyslipidemia   . GERD (gastroesophageal reflux disease)   . Hypertension   . Prostate cancer ([place]) 12/23/13   Adenocarcinoma  . PSA elevation   . S/P radiation therapy 05/18/2014 through 07/16/2014    Prostate 7800 cGy in 40 sessions, seminal vesicles 5600 cGy in 40 sessions     Medications:  Infusions:  . sodium chloride    . sodium chloride 10 mL/hr at 10/16/19 1115  . amiodarone 30 mg/hr (10/16/19 1115)  . heparin 1,150 Units/hr (10/16/19 1115)  . piperacillin-tazobactam (ZOSYN)  IV Stopped (10/16/19 1010)  .  potassium chloride Stopped (10/16/19 1112)  . potassium PHOSPHATE IVPB (in mmol)    . TPN ADULT (ION) 95 mL/hr at 10/16/19 1115  . TPN ADULT (ION)      Assessment: Patient with elevated troponin.  MD asking pharmacy to dose heparin for ACS.  Prior enoxaparin charted 6/17 ~11am.  Will reduce heparin bolus due to prior enoxaparin.  Today, 10/16/19  HL is 0.32, therapeutic   Hgb 10.6, plt 144   Plan for pt undergo UGI on 6/19. Paged Dr. Dema Severin with [facility] to clarify if need to hold. MD states that no need to hold.   No line or bleeding issues per RN   Goal of Therapy:  Heparin level 0.3-0.7 units/ml Monitor platelets by anticoagulation protocol: Yes   Plan:   Continue heparin drip at 1150 units/hr  Daily CBC  Obtain confirmatory heparin level 8 hours after initial one    Monitor for signs and symptoms of bleeding    Royetta Asal, PharmD, BCPS 10/16/2019 12:06 PM

## 2019-10-16 NOTE — Progress Notes (Signed)
CRITICAL VALUE ALERT  Critical Value:  Troponin 871  Date & Time Notied:  10/16/19, 0321  Provider Notified: Elink RN  Orders Received/Actions taken: Awaiting orders at this time. RN will continue to carefully monitor pt.

## 2019-10-16 NOTE — Progress Notes (Signed)
  Echocardiogram 2D Echocardiogram has been performed.  Jennette Dubin 10/16/2019, 3:25 PM

## 2019-10-16 NOTE — Progress Notes (Signed)
CRITICAL VALUE ALERT  Critical Value:  Troponin 776  Date & Time Notied:  10/16/19  Provider Notified: Warren Lacy RN  Orders Received/Actions taken: No new orders at this time; troponin trending down. This RN will continue to monitor.

## 2019-10-16 NOTE — Progress Notes (Signed)
CRITICAL VALUE ALERT  Critical Value: Troponin 1022  Date & Time Notied: 10/16/19, 0017  Provider Notified: Elink RN  Orders Received/Actions taken: Awaiting orders at this time; This RN will continue to carefully monitor pt.

## 2019-10-17 ENCOUNTER — Inpatient Hospital Stay (HOSPITAL_COMMUNITY): Payer: Medicare Other

## 2019-10-17 DIAGNOSIS — I10 Essential (primary) hypertension: Secondary | ICD-10-CM

## 2019-10-17 DIAGNOSIS — I214 Non-ST elevation (NSTEMI) myocardial infarction: Secondary | ICD-10-CM

## 2019-10-17 DIAGNOSIS — J9811 Atelectasis: Secondary | ICD-10-CM | POA: Diagnosis not present

## 2019-10-17 DIAGNOSIS — E118 Type 2 diabetes mellitus with unspecified complications: Secondary | ICD-10-CM

## 2019-10-17 DIAGNOSIS — R609 Edema, unspecified: Secondary | ICD-10-CM

## 2019-10-17 DIAGNOSIS — E1165 Type 2 diabetes mellitus with hyperglycemia: Secondary | ICD-10-CM

## 2019-10-17 DIAGNOSIS — I5041 Acute combined systolic (congestive) and diastolic (congestive) heart failure: Secondary | ICD-10-CM

## 2019-10-17 LAB — COMPREHENSIVE METABOLIC PANEL
ALT: 35 U/L (ref 0–44)
AST: 29 U/L (ref 15–41)
Albumin: 2 g/dL — ABNORMAL LOW (ref 3.5–5.0)
Alkaline Phosphatase: 79 U/L (ref 38–126)
Anion gap: 9 (ref 5–15)
BUN: 33 mg/dL — ABNORMAL HIGH (ref 8–23)
CO2: 32 mmol/L (ref 22–32)
Calcium: 7.6 mg/dL — ABNORMAL LOW (ref 8.9–10.3)
Chloride: 93 mmol/L — ABNORMAL LOW (ref 98–111)
Creatinine, Ser: 0.81 mg/dL (ref 0.61–1.24)
GFR calc Af Amer: >60 mL/min (ref >60)
GFR calc non Af Amer: >60 mL/min (ref >60)
Glucose, Bld: 332 mg/dL — ABNORMAL HIGH (ref 70–99)
Potassium: 3.1 mmol/L — ABNORMAL LOW (ref 3.5–5.1)
Sodium: 134 mmol/L — ABNORMAL LOW (ref 135–145)
Total Bilirubin: 1 mg/dL (ref 0.3–1.2)
Total Protein: 5.4 g/dL — ABNORMAL LOW (ref 6.5–8.1)

## 2019-10-17 LAB — GLUCOSE, CAPILLARY
Glucose-Capillary: 215 mg/dL — ABNORMAL HIGH (ref 70–99)
Glucose-Capillary: 223 mg/dL — ABNORMAL HIGH (ref 70–99)
Glucose-Capillary: 240 mg/dL — ABNORMAL HIGH (ref 70–99)
Glucose-Capillary: 242 mg/dL — ABNORMAL HIGH (ref 70–99)
Glucose-Capillary: 263 mg/dL — ABNORMAL HIGH (ref 70–99)
Glucose-Capillary: 275 mg/dL — ABNORMAL HIGH (ref 70–99)

## 2019-10-17 LAB — CBC WITH DIFFERENTIAL/PLATELET
Abs Immature Granulocytes: 0.32 10*3/uL — ABNORMAL HIGH (ref 0.00–0.07)
Basophils Absolute: 0 10*3/uL (ref 0.0–0.1)
Basophils Relative: 0 %
Eosinophils Absolute: 0.2 10*3/uL (ref 0.0–0.5)
Eosinophils Relative: 2 %
HCT: 29.9 % — ABNORMAL LOW (ref 39.0–52.0)
Hemoglobin: 10.1 g/dL — ABNORMAL LOW (ref 13.0–17.0)
Immature Granulocytes: 3 %
Lymphocytes Relative: 10 %
Lymphs Abs: 1 10*3/uL (ref 0.7–4.0)
MCH: 26.4 pg (ref 26.0–34.0)
MCHC: 33.8 g/dL (ref 30.0–36.0)
MCV: 78.1 fL — ABNORMAL LOW (ref 80.0–100.0)
Monocytes Absolute: 1.5 10*3/uL — ABNORMAL HIGH (ref 0.1–1.0)
Monocytes Relative: 15 %
Neutro Abs: 7.3 10*3/uL (ref 1.7–7.7)
Neutrophils Relative %: 70 %
Platelets: 187 10*3/uL (ref 150–400)
RBC: 3.83 MIL/uL — ABNORMAL LOW (ref 4.22–5.81)
RDW: 16.4 % — ABNORMAL HIGH (ref 11.5–15.5)
WBC: 10.3 10*3/uL (ref 4.0–10.5)
nRBC: 0 % (ref 0.0–0.2)

## 2019-10-17 LAB — HEPARIN LEVEL (UNFRACTIONATED): Heparin Unfractionated: 0.46 IU/mL (ref 0.30–0.70)

## 2019-10-17 LAB — PHOSPHORUS: Phosphorus: 2.4 mg/dL — ABNORMAL LOW (ref 2.5–4.6)

## 2019-10-17 LAB — MAGNESIUM: Magnesium: 1.9 mg/dL (ref 1.7–2.4)

## 2019-10-17 MED ORDER — METOPROLOL TARTRATE 5 MG/5ML IV SOLN
5.0000 mg | INTRAVENOUS | Status: DC
  Administered 2019-10-17 – 2019-11-04 (×106): 5 mg via INTRAVENOUS
  Filled 2019-10-17 (×110): qty 5

## 2019-10-17 MED ORDER — POTASSIUM PHOSPHATES 15 MMOLE/5ML IV SOLN
20.0000 mmol | Freq: Once | INTRAVENOUS | Status: AC
  Administered 2019-10-17: 20 mmol via INTRAVENOUS
  Filled 2019-10-17: qty 6.67

## 2019-10-17 MED ORDER — POTASSIUM CHLORIDE 10 MEQ/50ML IV SOLN
10.0000 meq | INTRAVENOUS | Status: AC
  Administered 2019-10-17 (×4): 10 meq via INTRAVENOUS
  Filled 2019-10-17 (×5): qty 50

## 2019-10-17 MED ORDER — TRAVASOL 10 % IV SOLN
INTRAVENOUS | Status: AC
  Filled 2019-10-17: qty 1208.4

## 2019-10-17 MED ORDER — LIP MEDEX EX OINT
TOPICAL_OINTMENT | CUTANEOUS | Status: DC | PRN
  Filled 2019-10-17 (×3): qty 7

## 2019-10-17 MED ORDER — CLONIDINE HCL 0.2 MG/24HR TD PTWK
0.2000 mg | MEDICATED_PATCH | TRANSDERMAL | Status: DC
  Administered 2019-10-17 – 2019-11-07 (×4): 0.2 mg via TRANSDERMAL
  Filled 2019-10-17 (×4): qty 1

## 2019-10-17 MED ORDER — MAGNESIUM SULFATE 4 GM/100ML IV SOLN
4.0000 g | Freq: Once | INTRAVENOUS | Status: AC
  Administered 2019-10-17: 4 g via INTRAVENOUS
  Filled 2019-10-17: qty 100

## 2019-10-17 NOTE — Progress Notes (Signed)
[place] Progress Note Patient Name: Matthew Schwartz DOB: 05-31-1932 MRN: [id number]   Date of Service  10/17/2019  HPI/Events of Note  K+ 3.1, PHOS 2.4  eICU Interventions  K+PHOS 20 mmol iv x 1        Americus Perkey U Ednamae Schiano 10/17/2019, 5:40 AM

## 2019-10-17 NOTE — Progress Notes (Addendum)
Left upper extremity venous duplex completed. Refer to "CV Proc" under chart review to view preliminary results.  Attempted secure chat and page to Dr. Halford Chessman with preliminary results with call back. Preliminary results discussed with Hilda Blades, RN.  10/17/2019 11:12 AM Kelby Aline., MHA, RVT, RDCS, RDMS

## 2019-10-17 NOTE — Progress Notes (Signed)
CCM notified of U/S results-  + for DVT in left axillary vein and acute superficial thrombosis in left cephalic vein. PICC to be changed 10/18/2019. Okay to continue using PICC at this time in left arm- IV team aware

## 2019-10-17 NOTE — Progress Notes (Signed)
ANTICOAGULATION CONSULT NOTE - [place] for Heparin Indication: chest pain/ACS  Allergies  Allergen Reactions  . Losartan Potassium Other (See Comments)    Unknown reaction that happened 15 years ago    Patient Measurements: Height: 5\' 11"  (180.3 cm) (per daughter) Weight: 96.4 kg (212 lb 8.4 oz) IBW/kg (Calculated) : 75.3 Heparin Dosing Weight:   Vital Signs: Temp: 98.8 F (37.1 C) (06/19 0807) Temp Source: Oral (06/19 0807) BP: 168/61 (06/19 0807) Pulse Rate: 75 (06/19 0807)  Labs: Recent Labs    10/15/19 0339 10/15/19 0339 10/15/19 2124 10/15/19 2314 10/16/19 0231 10/16/19 0231 10/16/19 0237 10/16/19 0500 10/16/19 0857 10/16/19 1713 10/17/19 0332 10/17/19 0741  HGB 9.9*   < >  --   --  10.6*  --   --   --   --   --  10.1*  --   HCT 30.0*  --   --   --  31.6*  --   --   --   --   --  29.9*  --   PLT 154  --   --   --  144*  --   --   --   --   --  187  --   HEPARINUNFRC  --   --   --   --  <0.10*   < >  --   --  0.32 0.34  --  0.46  CREATININE 1.08  --   --   --  0.93  --   --   --   --   --  0.81  --   TROPONINIHS  --   --    < > 1,022*  --   --  871* 776*  --   --   --   --    < > = values in this interval not displayed.    Estimated Creatinine Clearance: 80.4 mL/min (by C-G formula based on SCr of 0.81 mg/dL).   Medical History: Past Medical History:  Diagnosis Date  . Diabetes mellitus without complication ([place])   . Dyslipidemia   . GERD (gastroesophageal reflux disease)   . Hypertension   . Prostate cancer ([place]) 12/23/13   Adenocarcinoma  . PSA elevation   . S/P radiation therapy 05/18/2014 through 07/16/2014    Prostate 7800 cGy in 40 sessions, seminal vesicles 5600 cGy in 40 sessions     Medications:  Infusions:  . sodium chloride    . sodium chloride 10 mL/hr at 10/17/19 0218  . amiodarone 30 mg/hr (10/17/19 0612)  . heparin 1,150 Units/hr  (10/17/19 0400)  . piperacillin-tazobactam (ZOSYN)  IV Stopped (10/17/19 0730)  . potassium PHOSPHATE IVPB (in mmol) 20 mmol (10/17/19 0625)  . TPN ADULT (ION) 95 mL/hr at 10/17/19 0400    Assessment: Patient with elevated troponin.  MD asking pharmacy to dose heparin for ACS.  Prior enoxaparin charted 6/17 ~11am.  Will reduce heparin bolus due to prior enoxaparin.  Today, 10/17/19  HL is 0.46, therapeutic   Hgb 10.1, plt 187  Initial plan was  for pt undergo UGI on 6/19. But UGI on hold until pt stabilizes from CV/pulm standpoint   No line or bleeding issues per RN   Goal of Therapy:  Heparin level 0.3-0.7 units/ml Monitor platelets by anticoagulation protocol: Yes   Plan:   Continue heparin drip at 1150 units/hr  Daily CBC  Daily HL    Monitor for signs and symptoms of bleeding    Abigail Butts  Cele Mote, PharmD, BCPS 10/17/2019 8:46 AM

## 2019-10-17 NOTE — Progress Notes (Signed)
NAME:  Matthew Schwartz, MRN:  [id number], DOB:  07/07/1932, LOS: 5 ADMISSION DATE:  10/12/2019, CONSULTATION DATE:  10/12/2019 REFERRING MD:  Dr. [name], Surgery, CHIEF COMPLAINT:  Abdominal pain  Brief History   86 y/o male, former smoker, presented with nausea/vomiting, abdominal pain and constipation x 4 to 5 days.  Seen twice prior to current admit for the same with imaging studies unremarkable for cause.  On return 6/14, seen by GI and found to have concern for acute abdomen and taken for exploratory laparotomy.  Found to have perforated duodenal ulcer s/p omental patch repair.  Remained on vent post op.  PCCM consulted for critical care management.  Past Medical History  Prostate cancer, HTN, GERD, HLD, DM  Significant Hospital Events   6/14 Admit, exploratory laparotomy, omental patch repair of duodenal ulcer 6/16 Off vasopressors, less drainage from abd drain / clearing  6/17 extubated, weak cough, added high flow oxygen 6/18 start heparin gtt  Consults:  Gastroenterology General surgery Cardiology  Procedures:  Rt IJ CVL 6/14 >> 6/15 Rt radial a line 6/14 >> 6/17 ETT 6/14 >> 6/17 LUE PICC 6/16 >>   Significant Diagnostic Tests:   CT angio abd/pelvis 6/13 >> mild atherosclerosis, no significant arterial stenosis of mesenteric vasculature, no findings of bowel ischemia, colonic diverticulosis w/o diverticulitis, b/l renal cysts, 12 mm lesion upper pole Rt kidney  ECHO 6/15 >> poor image quality, appears to be regional wall motion variability but confident wall motion analysis is impossible even after contrast administration, LVEF ~ 62-83%, Grade I diastolic dysfunction, RV systolic function is moderately reduced, RV is mildly enlarged  Micro Data:  SARS CoV2 6/13 >> negative UC 6/14 >> less than 10k insignificant growth   Antimicrobials:  Zosyn 6/14 >>   Scheduled Meds: . Chlorhexidine Gluconate Cloth  6 each Topical Daily  . insulin aspart  0-15 Units Subcutaneous Q4H    . insulin detemir  10 Units Subcutaneous BID  . mouth rinse  15 mL Mouth Rinse BID  . metoprolol tartrate  5 mg Intravenous Q6H  . nitroGLYCERIN  1 inch Topical Q6H  . pantoprazole (PROTONIX) IV  40 mg Intravenous Q12H  . sodium chloride flush  10-40 mL Intracatheter Q12H   Continuous Infusions: . sodium chloride    . sodium chloride 10 mL/hr at 10/17/19 0218  . amiodarone 30 mg/hr (10/17/19 0612)  . heparin 1,150 Units/hr (10/17/19 0400)  . piperacillin-tazobactam (ZOSYN)  IV Stopped (10/17/19 0730)  . potassium PHOSPHATE IVPB (in mmol) 20 mmol (10/17/19 0625)  . TPN ADULT (ION) 95 mL/hr at 10/17/19 0400   PRN Meds:.sodium chloride, acetaminophen **OR** acetaminophen, fentaNYL (SUBLIMAZE) injection, ondansetron (ZOFRAN) IV, sodium chloride flush   Interim history/subjective:     Objective   Blood pressure (!) 150/62, pulse 75, temperature 99 F (37.2 C), temperature source Axillary, resp. rate (!) 26, height 5\' 11"  (1.803 m), weight 96.4 kg, SpO2 100 %. CVP:  [5 mmHg-6 mmHg] 6 mmHg  FiO2 (%):  [50 %] 50 %   Intake/Output Summary (Last 24 hours) at 10/17/2019 0806 Last data filed at 10/17/2019 6629 Gross per 24 hour  Intake 3425.43 ml  Output 5280 ml  Net -1854.57 ml   Filed Weights   10/13/19 0923  Weight: 96.4 kg   CVP:  [5 mmHg-6 mmHg] 6 mmHg    Examination: Tmax  99.1 Elderly male gurgling cough / very hoarse  Pt alert, approp nad @ 30 degrees hob  No jvd Oropharynx clear  Neck supple Lungs  with junky honchi bilaterally/ poor aeration bases  RRR no s3 or or sign murmur Abd poor insp  excursion / ng still to suction  Extr warm PAS hose  Neuro  Sensorium appears intact,  no apparent motor deficits       I personally reviewed images and   impression as follows:  CXR:   Portable 6/19 poor aeration bases no change from prior c/w atx   Resolved Hospital Problem list   Septic shock  Assessment & Plan:   Peritonitis in setting of perforated duodenal ulcer  s/p omental patch repair. - zosyn per micro flowsheet  - surgery planning to check Upper GI on 6/19 - protonix BID - TPN per surgery  Acute respiratory failure with hypoxia in setting of sepsis and peritonitis. - extubated 6/17 - weak cough and splitting from abdominal pain after extubation - continue bronchial hygiene - unable to tolerate Bipap due to nausea - continue high flow oxygen titrated to sat > 94%  - if he develops signs of respiratory muscle fatigue, would then need to proceed with re-intubation     NSVT. Acute systolic CHF in setting of sepsis likely from demand ischemia. Hx of CAD, HTN, HLD. - continue amiodarone, heparin gtt - IV lopressor, NTG patch - goal SBP < 160 - cardiology following;   - holding home norvasc, imdur, toprol, altace, ASA, lipitor until he is able to take pills - added clonidine patch am 6/19  - pverall vol status on dry side am 6/19 so no more diuresis   DM type II poorly controlled with hyperglycemia. - SSI - added levemir 6/18 - holding  home metformin    Anemia of critical illness. Lab Results  Component Value Date   HGB 10.1 (L) 10/17/2019   HGB 10.6 (L) 10/16/2019   HGB 9.9 (L) 10/15/2019   HGB 10.4 (L) 01/14/2019   HGB 10.8 (L) 04/12/2017   HGB 10.6 (L) 03/15/2017   HGB 10.9 (L) 02/15/2017    - f/u CBC - transfuse for Hb < 7 or significant bleeding  Best practice:  Diet: TPN DVT prophylaxis: heparin gtt GI prophylaxis: Protonix Mobility: Bed rest/ mobilize tolerated  Code Status: full code Disposition: ICU Communication:  Daughter at bedside / advised status still very tenuous    Labs    CMP Latest Ref Rng & Units 10/17/2019 10/16/2019 10/15/2019  Glucose 70 - 99 mg/dL 332(H) 268(H) 168(H)  BUN 8 - 23 mg/dL 33(H) 36(H) 44(H)  Creatinine 0.61 - 1.24 mg/dL 0.81 0.93 1.08  Sodium 135 - 145 mmol/L 134(L) 138 135  Potassium 3.5 - 5.1 mmol/L 3.1(L) 3.6 4.1  Chloride 98 - 111 mmol/L 93(L) 104 108  CO2 22 - 32 mmol/L 32  25 20(L)  Calcium 8.9 - 10.3 mg/dL 7.6(L) 7.7(L) 7.1(L)  Total Protein 6.5 - 8.1 g/dL 5.4(L) 5.3(L) 4.8(L)  Total Bilirubin 0.3 - 1.2 mg/dL 1.0 0.9 0.9  Alkaline Phos 38 - 126 U/L 79 71 43  AST 15 - 41 U/L 29 33 28  ALT 0 - 44 U/L 35 31 18    CBC Latest Ref Rng & Units 10/17/2019 10/16/2019 10/15/2019  WBC 4.0 - 10.5 K/uL 10.3 9.4 8.8  Hemoglobin 13.0 - 17.0 g/dL 10.1(L) 10.6(L) 9.9(L)  Hematocrit 39 - 52 % 29.9(L) 31.6(L) 30.0(L)  Platelets 150 - 400 K/uL 187 144(L) 154    ABG    Component Value Date/Time   PHART 7.406 10/15/2019 2112   PCO2ART 40.7 10/15/2019 2112   PO2ART 60.7 (  L) 10/15/2019 2112   HCO3 25.1 10/15/2019 2112   TCO2 22 10/12/2019 1717   ACIDBASEDEF 2.6 (H) 10/13/2019 0311   O2SAT 91.4 10/15/2019 2112    CBG (last 3)  Recent Labs    10/16/19 2340 10/17/19 0406 10/17/19 0755  GLUCAP 256* 240* 242*       The patient is critically ill with multiple organ systems failure and requires high complexity decision making for assessment and support, frequent evaluation and titration of therapies, application of advanced monitoring technologies and extensive interpretation of multiple databases. Critical Care Time devoted to patient care services described in this note is 40 minutes.    Christinia Gully, MD Pulmonary and [facility] [phone] After 6:00 PM or weekends, use [facility] [phone]  After 7:00 pm call Elink  [phone]

## 2019-10-17 NOTE — Progress Notes (Signed)
Progress Note  Patient Name: Matthew Schwartz Date of Encounter: 10/17/2019  [facility] Cardiologist: Candee Furbish, MD   Subjective   Denies any chest pain.  Reports dyspnea.  BP 150/62.  Remains on 25L HFNC.  Net negative 1.9L yesterday on IV lasix 40 mg x2.  Cr stable at 0.8.  Inpatient Medications    Scheduled Meds: . Chlorhexidine Gluconate Cloth  6 each Topical Daily  . insulin aspart  0-15 Units Subcutaneous Q4H  . insulin detemir  10 Units Subcutaneous BID  . mouth rinse  15 mL Mouth Rinse BID  . metoprolol tartrate  5 mg Intravenous Q6H  . nitroGLYCERIN  1 inch Topical Q6H  . pantoprazole (PROTONIX) IV  40 mg Intravenous Q12H  . sodium chloride flush  10-40 mL Intracatheter Q12H   Continuous Infusions: . sodium chloride    . sodium chloride 10 mL/hr at 10/17/19 0218  . amiodarone 30 mg/hr (10/17/19 0612)  . heparin 1,150 Units/hr (10/17/19 0400)  . piperacillin-tazobactam (ZOSYN)  IV Stopped (10/17/19 0730)  . potassium PHOSPHATE IVPB (in mmol) 20 mmol (10/17/19 0625)  . TPN ADULT (ION) 95 mL/hr at 10/17/19 0400   PRN Meds: sodium chloride, acetaminophen **OR** acetaminophen, fentaNYL (SUBLIMAZE) injection, ondansetron (ZOFRAN) IV, sodium chloride flush   Vital Signs    Vitals:   10/17/19 0400 10/17/19 0406 10/17/19 0500 10/17/19 0600  BP: (!) 160/67  (!) 160/71 (!) 150/62  Pulse: 75  78 75  Resp: (!) 28  (!) 28 (!) 26  Temp: 99 F (37.2 C)     TempSrc: Axillary     SpO2: 100% 99% 100% 100%  Weight:      Height:        Intake/Output Summary (Last 24 hours) at 10/17/2019 0754 Last data filed at 10/17/2019 1245 Gross per 24 hour  Intake 3425.43 ml  Output 5280 ml  Net -1854.57 ml   Last 3 Weights 10/13/2019 10/09/2019 07/22/2019  Weight (lbs) 212 lb 8.4 oz 200 lb 206 lb  Weight (kg) 96.4 kg 90.719 kg 93.441 kg      Telemetry    NSR with rate 70s, PVCs, NSVT up to 7 beats - Personally Reviewed  ECG     - Personally Reviewed  Physical Exam   GEN:  ill-appearing but no acutye distress Neck: No JVD Cardiac: RRR, no murmurs Respiratory: Diminished bilaterally GI: no bowel sounds, distended MS: No edema Neuro:  Nonfocal  Psych: answers questions appropriately  Labs    High Sensitivity Troponin:   Recent Labs  Lab 10/13/19 1014 10/15/19 2124 10/15/19 2314 10/16/19 0237 10/16/19 0500  TROPONINIHS 55* 659* 1,022* 871* 776*      Chemistry Recent Labs  Lab 10/15/19 0339 10/16/19 0231 10/17/19 0332  NA 135 138 134*  K 4.1 3.6 3.1*  CL 108 104 93*  CO2 20* 25 32  GLUCOSE 168* 268* 332*  BUN 44* 36* 33*  CREATININE 1.08 0.93 0.81  CALCIUM 7.1* 7.7* 7.6*  PROT 4.8* 5.3* 5.4*  ALBUMIN 2.0* 2.0* 2.0*  AST 28 33 29  ALT 18 31 35  ALKPHOS 43 71 79  BILITOT 0.9 0.9 1.0  GFRNONAA >60 >60 >60  GFRAA >60 >60 >60  ANIONGAP 7 9 9      Hematology Recent Labs  Lab 10/15/19 0339 10/16/19 0231 10/17/19 0332  WBC 8.8 9.4 10.3  RBC 3.79* 4.04* 3.83*  HGB 9.9* 10.6* 10.1*  HCT 30.0* 31.6* 29.9*  MCV 79.2* 78.2* 78.1*  MCH 26.1 26.2 26.4  MCHC 33.0 33.5 33.8  RDW 17.4* 17.2* 16.4*  PLT 154 144* 187    BNPNo results for input(s): BNP, PROBNP in the last 168 hours.   DDimer No results for input(s): DDIMER in the last 168 hours.   Radiology    DG Chest Port 1 View  Result Date: 10/15/2019 CLINICAL DATA:  Shortness of breath EXAM: PORTABLE CHEST 1 VIEW COMPARISON:  10/15/2019 FINDINGS: Endotracheal tube has been removed. NG tube is in the stomach. Layering bilateral effusions with bibasilar atelectasis or infiltrates, increasing since prior study. Cardiomegaly with vascular congestion. IMPRESSION: Interval extubation. Increasing layering bilateral effusions and bibasilar atelectasis or infiltrates. Vascular congestion. Electronically Signed   By: Rolm Baptise M.D.   On: 10/15/2019 21:43   ECHOCARDIOGRAM LIMITED  Result Date: 10/16/2019    ECHOCARDIOGRAM LIMITED REPORT   Patient Name:   Matthew Schwartz Date of Exam: 10/16/2019  Medical Rec #:  [id number]  Height:       71.0 in Accession #:    [id number] Weight:       212.5 lb Date of Birth:  [birth year]  BSA:          2.164 m Patient Age:    86 years   BP:           150/59 mmHg Patient Gender: M          HR:           77 bpm. Exam Location:  Inpatient Procedure: 2D Echo Indications:    NSTEMI I21.4  History:        Patient has prior history of Echocardiogram examinations, most                 recent 10/13/2019. Risk Factors:Hypertension, Dyslipidemia and                 Diabetes.  Sonographer:    Mikki Santee RDCS (AE) Referring Phys: [place]  1. Image quality remains technically difficult, making wall motion evaluation challenging. There is global left ventricular hypokinesis, but there appears to be disproportionately severe inferolateral wall hypokinesis. Left ventricular ejection fraction, by estimation, is 40 to 45%. The left ventricle has mildly decreased function. The left ventricle has no regional wall motion abnormalities. Left ventricular diastolic parameters are consistent with Grade I diastolic dysfunction (impaired relaxation).  2. Right ventricular systolic function is normal. The right ventricular size is normal.  3. The mitral valve is normal in structure. No evidence of mitral valve regurgitation. No evidence of mitral stenosis.  4. The aortic valve is normal in structure. Aortic valve regurgitation is not visualized. No aortic stenosis is present.  5. The inferior vena cava is normal in size with greater than 50% respiratory variability, suggesting right atrial pressure of 3 mmHg. Comparison(s): Compared to 10/13/2019 The left ventricular function is unchanged. The right ventricular systolic function has improved. Image quality remains poor. FINDINGS  Left Ventricle: Image quality remains technically difficult, making wall motion evaluation challenging. There is global left ventricular hypokinesis, but there appears to be disproportionately severe  inferolateral wall hypokinesis. Left ventricular ejection fraction, by estimation, is 40 to 45%. The left ventricle has mildly decreased function. The left ventricle has no regional wall motion abnormalities. The left ventricular internal cavity size was normal in size. There is no left ventricular hypertrophy. Normal left ventricular filling pressure. Right Ventricle: The right ventricular size is normal. No increase in right ventricular wall thickness. Right ventricular systolic function is normal. Left Atrium: Left  atrial size was normal in size. Right Atrium: Right atrial size was normal in size. Pericardium: There is no evidence of pericardial effusion. Mitral Valve: The mitral valve is normal in structure. Normal mobility of the mitral valve leaflets. No evidence of mitral valve stenosis. Tricuspid Valve: The tricuspid valve is normal in structure. Tricuspid valve regurgitation is not demonstrated. No evidence of tricuspid stenosis. Aortic Valve: The aortic valve is normal in structure. Aortic valve regurgitation is not visualized. No aortic stenosis is present. Pulmonic Valve: The pulmonic valve was normal in structure. Pulmonic valve regurgitation is trivial. No evidence of pulmonic stenosis. Aorta: The aortic root and ascending aorta are structurally normal, with no evidence of dilitation. Venous: The inferior vena cava is normal in size with greater than 50% respiratory variability, suggesting right atrial pressure of 3 mmHg. IAS/Shunts: No atrial level shunt detected by color flow Doppler.  LEFT VENTRICLE PLAX 2D LVIDd:         5.03 cm  Diastology LVIDs:         3.37 cm  LV e' lateral:   9.14 cm/s LV PW:         1.08 cm  LV E/e' lateral: 9.3 LV IVS:        1.13 cm LVOT diam:     2.40 cm LVOT Area:     4.52 cm  LEFT ATRIUM         Index LA diam:    3.30 cm 1.53 cm/m   AORTA Ao Root diam: 3.70 cm MITRAL VALVE MV Area (PHT): 2.74 cm     SHUNTS MV Decel Time: 277 msec     Systemic Diam: 2.40 cm MV E  velocity: 84.90 cm/s MV A velocity: 124.00 cm/s MV E/A ratio:  0.68 Mihai Croitoru MD Electronically signed by Sanda Klein MD Signature Date/Time: 10/16/2019/3:56:53 PM    Final     Cardiac Studies   TTE 10/16/19: 1. Image quality remains technically difficult, making wall motion  evaluation challenging. There is global left ventricular hypokinesis, but  there appears to be disproportionately severe inferolateral wall  hypokinesis. Left ventricular ejection  fraction, by estimation, is 40 to 45%. The left ventricle has mildly  decreased function. The left ventricle has no regional wall motion  abnormalities. Left ventricular diastolic parameters are consistent with  Grade I diastolic dysfunction (impaired  relaxation).  2. Right ventricular systolic function is normal. The right ventricular  size is normal.  3. The mitral valve is normal in structure. No evidence of mitral valve  regurgitation. No evidence of mitral stenosis.  4. The aortic valve is normal in structure. Aortic valve regurgitation is  not visualized. No aortic stenosis is present.  5. The inferior vena cava is normal in size with greater than 50%  respiratory variability, suggesting right atrial pressure of 3 mmHg.   TTE 10/13/19: 1. Image quality is poor. There appears to be regional wall motion  variability, but confident wall motion analysis is impossible, even after  contrast administration. Left ventricular ejection fraction, by  estimation, is 35 to 40%. The left ventricle has  moderately decreased function. The left ventricle demonstrates regional  wall motion abnormalities. Left ventricular diastolic parameters are  consistent with Grade I diastolic dysfunction (impaired relaxation).  2. Right ventricular systolic function is moderately reduced. The right  ventricular size is mildly enlarged. Tricuspid regurgitation signal is  inadequate for assessing PA pressure.  3. The mitral valve is normal in  structure. No evidence of mitral valve  regurgitation.  4. The aortic valve was not well visualized. Aortic valve regurgitation  is not visualized. No aortic stenosis is present.   Patient Profile     86 y.o. male with a hx of CAD with PTCA 2000 to RCA, DM, HTN, HLDnow admitted with perforated duodenal ulcer and peritonitis, with cardiology consultation for NSVT and abnormal EKG. Echocardiogram demonstrates newly reduced ejection fraction 35 to 40%, and EKG with precordial T wave inversions.  Respiratory decompensation after extubation with hypertension and with elevated troponin 10/15/19, concerning for demand ischemia.   Assessment & Plan    NSTEMI: h/o CAD with hx of PTCA to RCA in year 2000, by report.  On 6/17, had elevated troponin ([id number]>>776) in setting of respiratory decompensation requiring BiPAP.  EKG with intermittent left bundle branch block.  He continues to have difficulty clearing secretions, and demonstrates increased work of breathing. Could represent demand ischemia and a stress cardiomyopathic process in the setting of respiratory distress and resolving sepsis, as well as postoperative status.  He denies any chest pain.  TTE with challenging views but EF 40-45% (global hypokinesis but disproportionately severe IL HK) -Continue heparin gtt -Had planned for possible cath on Monday.  I do not think it would be the best time to proceed with cath.  His respiratory status is tenuous and he is on 25 L HFNC.  Would likely have to be reintubated to proceed with cath.  In addition, he remains strict n.p.o. and unable to take pills.  If he did have significant obstructive CAD, would be unable to start DAPT at this time.  He denies any chest pain and troponin elevation could represent demand ischemia in setting of hypoxic respiratory failure.  I think that it would be reasonable to proceed with heart catheterization at some point, but would recommend holding off until both respiratory  status and GI issues improved, as once he is able to lie flat without hypoxia and able to take PO, can proceed with cath.  Acute combined systolic and diastolic heart failure: TTE with EF 40-45% (global hypokinesis but disproportionately severe IL HK) -Will add GDMT as able.  Currently remains strict NPO -Continue diuresis  Hypertension-BP has been significantly elevated which may be related to medical stressors and respiratory distress.  It has been challenging to give home medications due to NG to suction without opportunity for clamping.  IV beta-blocker, hydralazine have been given, blood pressure is mildly improved today but remains in the systolics 417E.  Recommend initiation of heart failure therapy for the benefit of systolic dysfunction as well as hypertension when able to take PO  NSVT in acute setting with stable K+ and Mg+. Now on amiodarone and improved   Sepsis due to peritonitis with hypotension, now off of vasopressors and has been hypertensive  Acute respiratory failure -as above.  Extubated 10/15/2019.  Required BiPAP overnight for increased work of breathing.  Acute ABD with perforated duodenal ulcer and emergent exploratory lap and s/p omental patch repair.on ABX per surgery and CCM   For questions or updates, please contact [place] Please consult [email or web address] for contact info under        Signed, Donato Heinz, MD  10/17/2019, 7:54 AM

## 2019-10-17 NOTE — Progress Notes (Signed)
PHARMACY - TOTAL PARENTERAL NUTRITION CONSULT NOTE   Indication: Prolonged ileus  Patient Measurements: Height: 5\' 11"  (180.3 cm) (per daughter) Weight: 96.4 kg (212 lb 8.4 oz) IBW/kg (Calculated) : 75.3   Body mass index is 29.64 kg/m. Usual Weight: 90.7 kg   Assessment:  - Pharmacy is consulted to start TPN in 86 yo male with prolonged ileus. Pt underwent exploratory laparotomy on 6/14 which revealed perforated duodenal ulcer.   Glucose / Insulin: hx of DM - on metformin PTA.  - CBGs mostly well controlled on mSSI q4h (range 195-267; goal 100-150) - 42 units of insulin yesterday - Pt ordered Levemir 10 units BID per CCM 6/18  Electrolytes: K (target > 4.0) is 3.1 . Magnesium 1.9 ( target > 2.0) ; phos 2.4; Corr Ca is 9.3 WNL , Na slightly low but has been stable  , Renal: SCr now WNL,  BUN elevated; bicarb WNL, UOP increased after receiving Lasix LFTs / TGs: LFTs remain WNL, Tbili improved to WNL after procedure; TG are now WNL after stopping propofol. Previously elevated level likely spurious  Prealbumin / albumin: prealbumin undetectable; albumin 2.3 ( 6/16) 2.0 ( 6/17, 6/18, 6/19)  Intake / Output; MIVF: - 3816 mL yesterday after two doses of Lasix; on NS at [place]:  Surgeries / Procedures:  - 6/14 exploratory laparotomy on 6/14 which revealed perforated duodenal ulcer.    Central access:  PICC Line 6/15 TPN start date: 6/15  Nutritional Goals: Pending RD recs  (per RD recommendation on 6/15): Kcal:  2200-2400 Protein:  115-125 grams Fluid:  >2L/d Goal TPN rate is 95 mL/hr (provides 121 g of protein and 2262 kcals per day)    Current Nutrition:  NPO    Plan:   Potassium Phosphate 20  mmol IV x 1 already ordered by MD   Potassium chloride 10 meq IV x 4  Magnesium 4 gr IV x1      Continue TPN to 95 mL/hr at 1800, the target rate   Electrolytes in TPN: 80 mEq/L of Na, 80 mEq/L of K, 5 mEq/L of Ca, 10 mEq/L of Magnesium, and 10 mmol/L of Phos.  Cl:Ac ratio max Ac  Standard MVI and trace elements to TPN  Continue Moderate q4h SSI, and monitor  Levemir and adjust as needed   Will add 25 units of regular insulin to TPN   Continue MIVF to KVO mL/hr at 1800  BMP, magnesium, phosphate with AM labs   Monitor TPN labs on Mon/Thurs;      Royetta Asal, PharmD, BCPS 10/17/2019 8:51 AM

## 2019-10-17 NOTE — Progress Notes (Signed)
Placed new water bottle on heated high flow 02 system- uneventful.

## 2019-10-17 NOTE — Progress Notes (Signed)
5 Days Post-Op    CC: Abdominal pain  Subjective: Stable relative to yesterday - events noted including potential plans to transfer to [facility] when bed available to cardiac cath on Monday. Hgb stable on heparin gtt. Denies n/v. Denies abdominal pain.   Objective: Vital signs in last 24 hours: Temp:  [97.8 F (36.6 C)-99.1 F (37.3 C)] 99 F (37.2 C) (06/19 0400) Pulse Rate:  [67-82] 75 (06/19 0600) Resp:  [15-33] 26 (06/19 0600) BP: (137-190)/(54-154) 150/62 (06/19 0600) SpO2:  [93 %-100 %] 100 % (06/19 0600) FiO2 (%):  [50 %] 50 % (06/19 0406) Last BM Date: 10/16/19  Intake/Output from previous day: 06/18 0701 - 06/19 0700 In: 3425.4 [I.V.:2822.4; NG/GT:30; IV Piggyback:573] Out: 5280 [Urine:4900; Emesis/NG output:300; Drains:80] Intake/Output this shift: No intake/output data recorded.  General appearance: alert, cooperative and in no distress Resp: coarse crackles bilaterally GI: Soft, nondistended, not significantly tender. JP with ss fluid in bulb Extremities: extremities normal, atraumatic, no cyanosis or edema  Lab Results:  Recent Labs    10/16/19 0231 10/17/19 0332  WBC 9.4 10.3  HGB 10.6* 10.1*  HCT 31.6* 29.9*  PLT 144* 187    BMET Recent Labs    10/16/19 0231 10/17/19 0332  NA 138 134*  K 3.6 3.1*  CL 104 93*  CO2 25 32  GLUCOSE 268* 332*  BUN 36* 33*  CREATININE 0.93 0.81  CALCIUM 7.7* 7.6*   PT/INR No results for input(s): LABPROT, INR in the last 72 hours.  Recent Labs  Lab 10/13/19 0230 10/14/19 0409 10/15/19 0339 10/16/19 0231 10/17/19 0332  AST 31 21 28  33 29  ALT 25 17 18 31  35  ALKPHOS 26* 33* 43 71 79  BILITOT 1.9* 0.9 0.9 0.9 1.0  PROT 4.8* 4.7* 4.8* 5.3* 5.4*  ALBUMIN 2.9* 2.3* 2.0* 2.0* 2.0*     Lipase     Component Value Date/Time   LIPASE 44 10/12/2019 0320     Medications: . Chlorhexidine Gluconate Cloth  6 each Topical Daily  . insulin aspart  0-15 Units Subcutaneous Q4H  . insulin detemir  10 Units  Subcutaneous BID  . mouth rinse  15 mL Mouth Rinse BID  . metoprolol tartrate  5 mg Intravenous Q6H  . nitroGLYCERIN  1 inch Topical Q6H  . pantoprazole (PROTONIX) IV  40 mg Intravenous Q12H  . sodium chloride flush  10-40 mL Intracatheter Q12H   . sodium chloride    . sodium chloride 10 mL/hr at 10/17/19 0218  . amiodarone 30 mg/hr (10/17/19 0612)  . heparin 1,150 Units/hr (10/17/19 0400)  . piperacillin-tazobactam (ZOSYN)  IV Stopped (10/17/19 0730)  . potassium PHOSPHATE IVPB (in mmol) 20 mmol (10/17/19 0625)  . TPN ADULT (ION) 95 mL/hr at 10/17/19 0400    Assessment/Plan Remote hx of prostate cancer s/p radiation HTN - off pressors HLD GERD - Protonix DM2 VT  - on amio drip Hyperglycemia - SSI Hypokalemia   Severe Protein calorie malnutrition - Pre-alb < 5. TPN  Perforated Duodenal Ulcer S/p Exploratory laparotomy, omental patch repair of duodenal ulcer - Dr. [name] - 10/12/2019 - POD #5 - Maintain JP drain. No further bile in drain.  - TPN - Continue NGT today; initially had plans for UGI today - ICU team has concerns about transport as he is on high O2 and tenuous status. Even if UGI was negative for leak, wouldn't want liquids today given tenuous respiratory status. Therefore, will wait on getting UGI until he stabilizes from CV/Pulm standpoint. -  We will follow along closely. Appreciate CCM assistance  FEN -NPO, NGT, IVF, TPN VTE - SCDs ID -Zosyn 6/14 >> day 5 Foley - in place Follow-Up - as per primary  Plan: Continue current treatment.       LOS: 5 days    Nadeen Landau, M.D. [facility] Use [email or web address] to contact on call provider

## 2019-10-17 NOTE — Progress Notes (Signed)
PT Cancellation Note  Patient Details Name: Matthew Schwartz MRN: [id number] DOB: 05-Mar-1933   Cancelled Treatment:    Reason Eval/Treat Not Completed: Medical issues which prohibited therapy, to transfer to [facility] PT will follow for medical clearance.    Claretha Cooper 10/17/2019, 7:45 AM [place] Pager [phone] Office [phone]

## 2019-10-18 LAB — CBC WITH DIFFERENTIAL/PLATELET
Abs Immature Granulocytes: 0.96 10*3/uL — ABNORMAL HIGH (ref 0.00–0.07)
Basophils Absolute: 0.1 10*3/uL (ref 0.0–0.1)
Basophils Relative: 0 %
Eosinophils Absolute: 0.2 10*3/uL (ref 0.0–0.5)
Eosinophils Relative: 1 %
HCT: 31.7 % — ABNORMAL LOW (ref 39.0–52.0)
Hemoglobin: 10.5 g/dL — ABNORMAL LOW (ref 13.0–17.0)
Immature Granulocytes: 6 %
Lymphocytes Relative: 10 %
Lymphs Abs: 1.6 10*3/uL (ref 0.7–4.0)
MCH: 25.8 pg — ABNORMAL LOW (ref 26.0–34.0)
MCHC: 33.1 g/dL (ref 30.0–36.0)
MCV: 77.9 fL — ABNORMAL LOW (ref 80.0–100.0)
Monocytes Absolute: 1.3 10*3/uL — ABNORMAL HIGH (ref 0.1–1.0)
Monocytes Relative: 9 %
Neutro Abs: 10.9 10*3/uL — ABNORMAL HIGH (ref 1.7–7.7)
Neutrophils Relative %: 74 %
Platelets: 201 10*3/uL (ref 150–400)
RBC: 4.07 MIL/uL — ABNORMAL LOW (ref 4.22–5.81)
RDW: 16.3 % — ABNORMAL HIGH (ref 11.5–15.5)
WBC: 15 10*3/uL — ABNORMAL HIGH (ref 4.0–10.5)
nRBC: 0.1 % (ref 0.0–0.2)

## 2019-10-18 LAB — GLUCOSE, CAPILLARY
Glucose-Capillary: 162 mg/dL — ABNORMAL HIGH (ref 70–99)
Glucose-Capillary: 172 mg/dL — ABNORMAL HIGH (ref 70–99)
Glucose-Capillary: 191 mg/dL — ABNORMAL HIGH (ref 70–99)
Glucose-Capillary: 193 mg/dL — ABNORMAL HIGH (ref 70–99)
Glucose-Capillary: 201 mg/dL — ABNORMAL HIGH (ref 70–99)
Glucose-Capillary: 230 mg/dL — ABNORMAL HIGH (ref 70–99)

## 2019-10-18 LAB — BASIC METABOLIC PANEL
Anion gap: 12 (ref 5–15)
BUN: 33 mg/dL — ABNORMAL HIGH (ref 8–23)
CO2: 28 mmol/L (ref 22–32)
Calcium: 8.4 mg/dL — ABNORMAL LOW (ref 8.9–10.3)
Chloride: 99 mmol/L (ref 98–111)
Creatinine, Ser: 0.73 mg/dL (ref 0.61–1.24)
GFR calc Af Amer: >60 mL/min (ref >60)
GFR calc non Af Amer: >60 mL/min (ref >60)
Glucose, Bld: 206 mg/dL — ABNORMAL HIGH (ref 70–99)
Potassium: 4 mmol/L (ref 3.5–5.1)
Sodium: 139 mmol/L (ref 135–145)

## 2019-10-18 LAB — HEPARIN LEVEL (UNFRACTIONATED): Heparin Unfractionated: 0.41 IU/mL (ref 0.30–0.70)

## 2019-10-18 LAB — PHOSPHORUS: Phosphorus: 2.5 mg/dL (ref 2.5–4.6)

## 2019-10-18 LAB — MAGNESIUM: Magnesium: 2.4 mg/dL (ref 1.7–2.4)

## 2019-10-18 MED ORDER — HYDRALAZINE HCL 20 MG/ML IJ SOLN
10.0000 mg | INTRAMUSCULAR | Status: DC | PRN
  Administered 2019-10-18: 10 mg via INTRAVENOUS
  Filled 2019-10-18: qty 1

## 2019-10-18 MED ORDER — HYDRALAZINE HCL 20 MG/ML IJ SOLN: 20.0000 mg | INTRAMUSCULAR | Status: DC | PRN

## 2019-10-18 MED ORDER — INSULIN ASPART 100 UNIT/ML ~~LOC~~ SOLN
0.0000 [IU] | SUBCUTANEOUS | Status: DC
  Administered 2019-10-18 (×3): 4 [IU] via SUBCUTANEOUS
  Administered 2019-10-19 (×4): 3 [IU] via SUBCUTANEOUS
  Administered 2019-10-19 (×2): 4 [IU] via SUBCUTANEOUS
  Administered 2019-10-20 (×6): 3 [IU] via SUBCUTANEOUS
  Administered 2019-10-20: 4 [IU] via SUBCUTANEOUS
  Administered 2019-10-21 (×5): 3 [IU] via SUBCUTANEOUS
  Administered 2019-10-22: 4 [IU] via SUBCUTANEOUS
  Administered 2019-10-22 (×3): 3 [IU] via SUBCUTANEOUS
  Administered 2019-10-22: 4 [IU] via SUBCUTANEOUS
  Administered 2019-10-22 – 2019-10-23 (×3): 3 [IU] via SUBCUTANEOUS
  Administered 2019-10-23: 4 [IU] via SUBCUTANEOUS
  Administered 2019-10-23 – 2019-10-28 (×16): 3 [IU] via SUBCUTANEOUS

## 2019-10-18 MED ORDER — TRAVASOL 10 % IV SOLN
INTRAVENOUS | Status: AC
  Filled 2019-10-18: qty 1208.4

## 2019-10-18 NOTE — Progress Notes (Signed)
NAME:  Matthew Schwartz, MRN:  [id number], DOB:  03-02-1933, LOS: 6 ADMISSION DATE:  10/12/2019, CONSULTATION DATE:  10/12/2019 REFERRING MD:  Dr. [name], Surgery, CHIEF COMPLAINT:  Abdominal pain  Brief History   86 y/o male, former smoker, presented with nausea/vomiting, abdominal pain and constipation x 4 to 5 days.  Seen twice prior to current admit for the same with imaging studies unremarkable for cause.  On return 6/14, seen by GI and found to have concern for acute abdomen and taken for exploratory laparotomy.  Found to have perforated duodenal ulcer s/p omental patch repair.  Remained on vent post op.  PCCM consulted for critical care management.  Past Medical History  Prostate cancer, HTN, GERD, HLD, DM  Significant Hospital Events   6/14 Admit, exploratory laparotomy, omental patch repair of duodenal ulcer 6/16 Off vasopressors, less drainage from abd drain / clearing  6/17 extubated, weak cough, added high flow oxygen 6/18 start heparin gtt 6/20 d/c amiodarone  Consults:  Gastroenterology General surgery Cardiology  Procedures:  Rt IJ CVL 6/14 >> 6/15 Rt radial a line 6/14 >> 6/17 ETT 6/14 >> 6/17 LUE PICC 6/16 >> Pos venous thrombosis 6/19 but decision to leave in and continue  hep   Significant Diagnostic Tests:  CT angio abd/pelvis 6/13 >> mild atherosclerosis, no significant arterial stenosis of mesenteric vasculature, no findings of bowel ischemia, colonic diverticulosis w/o diverticulitis, b/l renal cysts, 12 mm lesion upper pole Rt kidney ECHO 6/15 >> poor image quality, appears to be regional wall motion variability but confident wall motion analysis is impossible even after contrast administration, LVEF ~ 53-66%, Grade I diastolic dysfunction, RV systolic function is moderately reduced, RV is mildly enlarged  6/19 venous doppler LUE   acute deep vein thrombosis involving the left axillary vein, surrounding the PIC line. Findings consistent with acute superficial vein  thrombosis involving the left cephalic vein. UGI 6/20 >>>  Micro Data:  SARS CoV2 6/13 >> negative UC 6/14 >> less than 10k insignificant growth    Antimicrobials:  Zosyn 6/14 >>   Scheduled Meds: . Chlorhexidine Gluconate Cloth  6 each Topical Daily  . cloNIDine  0.2 mg Transdermal Weekly  . insulin aspart  0-15 Units Subcutaneous Q4H  . insulin detemir  10 Units Subcutaneous BID  . mouth rinse  15 mL Mouth Rinse BID  . metoprolol tartrate  5 mg Intravenous Q4H  . nitroGLYCERIN  1 inch Topical Q6H  . pantoprazole (PROTONIX) IV  40 mg Intravenous Q12H  . sodium chloride flush  10-40 mL Intracatheter Q12H   Continuous Infusions: . sodium chloride    . sodium chloride 10 mL/hr at 10/17/19 0218  . heparin 1,150 Units/hr (10/18/19 0826)  . piperacillin-tazobactam (ZOSYN)  IV Stopped (10/18/19 0751)  . TPN ADULT (ION) 95 mL/hr at 10/18/19 0826   PRN Meds:.sodium chloride, acetaminophen **OR** acetaminophen, fentaNYL (SUBLIMAZE) injection, lip balm, ondansetron (ZOFRAN) IV, sodium chloride flush   Interim history/subjective:   more alert, still weak cough mechanics/ tan mucus   Objective   Blood pressure (!) 165/66, pulse 73, temperature 99.1 F (37.3 C), temperature source Oral, resp. rate (!) 22, height 5\' 11"  (1.803 m), weight 96.4 kg, SpO2 97 %.    FiO2 (%):  [30 %-40 %] 30 %   Intake/Output Summary (Last 24 hours) at 10/18/2019 0840 Last data filed at 10/18/2019 0826 Gross per 24 hour  Intake 3849.31 ml  Output 1020 ml  Net 2829.31 ml   Filed Weights   10/13/19 4403  Weight: 96.4 kg       CVP:  [5 mmHg] 5 mmHg    Examination: Tmax   99.2 / 13 lpm 02 Pt more alert nad @ 45 degrees hob  No jvd Neck supple Lungs with junky exp/  insp rhonchi bilaterally RRR no s3 or or sign murmur Abd  Poor excursion / wounds ok per surgery  Extr warm with no edema or clubbing noted        Resolved Hospital Problem list   Septic shock  Assessment & Plan:    Peritonitis in setting of perforated duodenal ulcer s/p omental patch repair. - zosyn per micro flowsheet  - surgery planning to do Upper GI on 6/20 - protonix BID - TPN per surgery  Acute respiratory failure with hypoxia in setting of sepsis and peritonitis. - extubated 6/17 - weak cough and splitting from abdominal pain after extubation with atx still a concern in bases  - continue bronchial hygiene - unable to tolerate Bipap due to nausea - continue high flow oxygen titrated to sat > 94%  - if he develops signs of respiratory muscle fatigue, would then need to proceed with re-intubation     NSVT. Acute systolic CHF in setting of sepsis likely from demand ischemia. Hx of CAD, HTN, HLD. -   heparin gtt -  IV lopressor, NTG patch/ clonidine patch  - goal SBP < 140 as has IHD and chf (adequately BB so shouldn't get tachy - cardiology following    - holding home norvasc, imdur, toprol, altace, ASA, lipitor until he is able to take pills - cvp low am 6/20 so hold further diuresis  And ok to add acei once passes UGI eval 6/20    DM type II poorly controlled with hyperglycemia. - SSI - added levemir 6/18 - holding  home metformin    Anemia of critical illness. Lab Results  Component Value Date   HGB 10.5 (L) 10/18/2019   HGB 10.1 (L) 10/17/2019   HGB 10.6 (L) 10/16/2019   HGB 10.4 (L) 01/14/2019   HGB 10.8 (L) 04/12/2017   HGB 10.6 (L) 03/15/2017   HGB 10.9 (L) 02/15/2017    - f/u CBC - transfuse for Hb < 7 or significant bleeding  Best practice:  Diet: TPN DVT prophylaxis: heparin gtt GI prophylaxis: Protonix Mobility: Bed rest/ mobilize tolerated  Code Status: full code Disposition: ICU Communication:  Daughter at bedside / advised status still very tenuous    Labs    CMP Latest Ref Rng & Units 10/18/2019 10/17/2019 10/16/2019  Glucose 70 - 99 mg/dL 206(H) 332(H) 268(H)  BUN 8 - 23 mg/dL 33(H) 33(H) 36(H)  Creatinine 0.61 - 1.24 mg/dL 0.73 0.81 0.93  Sodium  135 - 145 mmol/L 139 134(L) 138  Potassium 3.5 - 5.1 mmol/L 4.0 3.1(L) 3.6  Chloride 98 - 111 mmol/L 99 93(L) 104  CO2 22 - 32 mmol/L 28 32 25  Calcium 8.9 - 10.3 mg/dL 8.4(L) 7.6(L) 7.7(L)  Total Protein 6.5 - 8.1 g/dL - 5.4(L) 5.3(L)  Total Bilirubin 0.3 - 1.2 mg/dL - 1.0 0.9  Alkaline Phos 38 - 126 U/L - 79 71  AST 15 - 41 U/L - 29 33  ALT 0 - 44 U/L - 35 31    CBC Latest Ref Rng & Units 10/18/2019 10/17/2019 10/16/2019  WBC 4.0 - 10.5 K/uL 15.0(H) 10.3 9.4  Hemoglobin 13.0 - 17.0 g/dL 10.5(L) 10.1(L) 10.6(L)  Hematocrit 39 - 52 % 31.7(L) 29.9(L) 31.6(L)  Platelets 150 -  400 K/uL 201 187 144(L)    ABG    Component Value Date/Time   PHART 7.406 10/15/2019 2112   PCO2ART 40.7 10/15/2019 2112   PO2ART 60.7 (L) 10/15/2019 2112   HCO3 25.1 10/15/2019 2112   TCO2 22 10/12/2019 1717   ACIDBASEDEF 2.6 (H) 10/13/2019 0311   O2SAT 91.4 10/15/2019 2112    CBG (last 3)  Recent Labs    10/17/19 1947 10/17/19 2336 10/18/19 [place]* 201*      Christinia Gully, MD [facility] [place] [phone]   After 7:00 pm call Elink  [phone]

## 2019-10-18 NOTE — Progress Notes (Addendum)
Progress Note  Patient Name: Matthew Schwartz Date of Encounter: 10/18/2019  [facility] Cardiologist: Candee Furbish, MD   Subjective   Denies any chest pain.  Continues to have dyspnea.  BP 165/66.  On 15L [place].    Inpatient Medications    Scheduled Meds: . Chlorhexidine Gluconate Cloth  6 each Topical Daily  . cloNIDine  0.2 mg Transdermal Weekly  . insulin aspart  0-15 Units Subcutaneous Q4H  . insulin detemir  10 Units Subcutaneous BID  . mouth rinse  15 mL Mouth Rinse BID  . metoprolol tartrate  5 mg Intravenous Q4H  . nitroGLYCERIN  1 inch Topical Q6H  . pantoprazole (PROTONIX) IV  40 mg Intravenous Q12H  . sodium chloride flush  10-40 mL Intracatheter Q12H   Continuous Infusions: . sodium chloride    . sodium chloride 10 mL/hr at 10/17/19 0218  . amiodarone 30 mg/hr (10/17/19 1831)  . heparin 1,150 Units/hr (10/17/19 1831)  . piperacillin-tazobactam (ZOSYN)  IV 3.375 g (10/18/19 0351)  . TPN ADULT (ION) 95 mL/hr at 10/17/19 1831   PRN Meds: sodium chloride, acetaminophen **OR** acetaminophen, fentaNYL (SUBLIMAZE) injection, lip balm, ondansetron (ZOFRAN) IV, sodium chloride flush   Vital Signs    Vitals:   10/18/19 0500 10/18/19 0515 10/18/19 0521 10/18/19 0600  BP: (S) (!) 188/78 (!) 150/77  (!) 165/66  Pulse: 79 72 68 66  Resp: (!) 29 (!) 23 (!) 25 (!) 24  Temp:      TempSrc:      SpO2: 99% 100% 99% 94%  Weight:      Height:        Intake/Output Summary (Last 24 hours) at 10/18/2019 0631 Last data filed at 10/18/2019 0500 Gross per 24 hour  Intake 2057.91 ml  Output 1000 ml  Net 1057.91 ml   Last 3 Weights 10/13/2019 10/09/2019 07/22/2019  Weight (lbs) 212 lb 8.4 oz 200 lb 206 lb  Weight (kg) 96.4 kg 90.719 kg 93.441 kg      Telemetry    NSR with rate 60-70s, rare PVCs - Personally Reviewed  ECG     No new ECG- Personally Reviewed  Physical Exam   GEN: ill-appearing but no acute distress Neck: No JVD Cardiac: RRR, no murmurs Respiratory:  Diminished bilaterally GI: no bowel sounds, distended MS: No edema Neuro:  Nonfocal  Psych: answers questions appropriately  Labs    High Sensitivity Troponin:   Recent Labs  Lab 10/13/19 1014 10/15/19 2124 10/15/19 2314 10/16/19 0237 10/16/19 0500  TROPONINIHS 55* 659* 1,022* 871* 776*      Chemistry Recent Labs  Lab 10/15/19 0339 10/15/19 0339 10/16/19 0231 10/17/19 0332 10/18/19 0445  NA 135   < > 138 134* 139  K 4.1   < > 3.6 3.1* 4.0  CL 108   < > 104 93* 99  CO2 20*   < > 25 32 28  GLUCOSE 168*   < > 268* 332* 206*  BUN 44*   < > 36* 33* 33*  CREATININE 1.08   < > 0.93 0.81 0.73  CALCIUM 7.1*   < > 7.7* 7.6* 8.4*  PROT 4.8*  --  5.3* 5.4*  --   ALBUMIN 2.0*  --  2.0* 2.0*  --   AST 28  --  33 29  --   ALT 18  --  31 35  --   ALKPHOS 43  --  71 79  --   BILITOT 0.9  --  0.9 1.0  --  GFRNONAA >60   < > >60 >60 >60  GFRAA >60   < > >60 >60 >60  ANIONGAP 7   < > 9 9 12    < > = values in this interval not displayed.     Hematology Recent Labs  Lab 10/16/19 0231 10/17/19 0332 10/18/19 0445  WBC 9.4 10.3 15.0*  RBC 4.04* 3.83* 4.07*  HGB 10.6* 10.1* 10.5*  HCT 31.6* 29.9* 31.7*  MCV 78.2* 78.1* 77.9*  MCH 26.2 26.4 25.8*  MCHC 33.5 33.8 33.1  RDW 17.2* 16.4* 16.3*  PLT 144* 187 201    BNPNo results for input(s): BNP, PROBNP in the last 168 hours.   DDimer No results for input(s): DDIMER in the last 168 hours.   Radiology    DG Chest Port 1 View  Result Date: 10/17/2019 CLINICAL DATA:  Respiratory failure EXAM: PORTABLE CHEST 1 VIEW COMPARISON:  10/15/2019 FINDINGS: Endotracheal tube extends the stomach. Stable cardiac silhouette. There bilateral pleural effusions low lung volumes. No infiltrate. No pneumothorax. IMPRESSION: 1. No significant change. 2. Low lung volumes and bilateral pleural effusion. Electronically Signed   By: Suzy Bouchard M.D.   On: 10/17/2019 09:46   VAS [place] UPPER EXTREMITY VENOUS DUPLEX  Result Date: 10/17/2019 UPPER  VENOUS STUDY  Indications: Edema, Pain, and PICC line LUE Limitations: Line and bandages. Comparison Study: No prior study Performing Technologist: Maudry Mayhew MHA, RDMS, RVT, RDCS  Examination Guidelines: A complete evaluation includes B-mode imaging, spectral Doppler, color Doppler, and power Doppler as needed of all accessible portions of each vessel. Bilateral testing is considered an integral part of a complete examination. Limited examinations for reoccurring indications may be performed as noted.  Right Findings: +----------+------------+---------+-----------+----------+--------------+ RIGHT     CompressiblePhasicitySpontaneousProperties   Summary     +----------+------------+---------+-----------+----------+--------------+ Subclavian                                          Not visualized +----------+------------+---------+-----------+----------+--------------+  Left Findings: +----------+------------+---------+-----------+----------+--------------+ LEFT      CompressiblePhasicitySpontaneousProperties   Summary     +----------+------------+---------+-----------+----------+--------------+ IJV           Full       Yes       Yes                             +----------+------------+---------+-----------+----------+--------------+ Subclavian    Full       Yes       Yes                             +----------+------------+---------+-----------+----------+--------------+ Axillary    Partial      Yes       Yes                  Acute      +----------+------------+---------+-----------+----------+--------------+ Brachial      Full                                    Duplicated   +----------+------------+---------+-----------+----------+--------------+ Radial        Full                                                 +----------+------------+---------+-----------+----------+--------------+  Ulnar         Full                                                  +----------+------------+---------+-----------+----------+--------------+ Cephalic      None                                      Acute      +----------+------------+---------+-----------+----------+--------------+ Basilic                                             Not visualized +----------+------------+---------+-----------+----------+--------------+  Summary:  Left: Findings consistent with acute deep vein thrombosis involving the left axillary vein, surrounding the PICC line. Findings consistent with acute superficial vein thrombosis involving the left cephalic vein.  *See table(s) above for measurements and observations.  Diagnosing physician: Servando Snare MD Electronically signed by Servando Snare MD on 10/17/2019 at 11:18:39 AM.    Final    ECHOCARDIOGRAM LIMITED  Result Date: 10/16/2019    ECHOCARDIOGRAM LIMITED REPORT   Patient Name:   Matthew Schwartz Date of Exam: 10/16/2019 Medical Rec #:  [id number]  Height:       71.0 in Accession #:    [id number] Weight:       212.5 lb Date of Birth:  1933-02-17  BSA:          2.164 m Patient Age:    48 years   BP:           150/59 mmHg Patient Gender: M          HR:           77 bpm. Exam Location:  Inpatient Procedure: 2D Echo Indications:    NSTEMI I21.4  History:        Patient has prior history of Echocardiogram examinations, most                 recent 10/13/2019. Risk Factors:Hypertension, Dyslipidemia and                 Diabetes.  Sonographer:    Mikki Santee RDCS (AE) Referring Phys: [place]  1. Image quality remains technically difficult, making wall motion evaluation challenging. There is global left ventricular hypokinesis, but there appears to be disproportionately severe inferolateral wall hypokinesis. Left ventricular ejection fraction, by estimation, is 40 to 45%. The left ventricle has mildly decreased function. The left ventricle has no regional wall motion abnormalities. Left ventricular diastolic parameters  are consistent with Grade I diastolic dysfunction (impaired relaxation).  2. Right ventricular systolic function is normal. The right ventricular size is normal.  3. The mitral valve is normal in structure. No evidence of mitral valve regurgitation. No evidence of mitral stenosis.  4. The aortic valve is normal in structure. Aortic valve regurgitation is not visualized. No aortic stenosis is present.  5. The inferior vena cava is normal in size with greater than 50% respiratory variability, suggesting right atrial pressure of 3 mmHg. Comparison(s): Compared to 10/13/2019 The left ventricular function is unchanged. The right ventricular systolic function has improved. Image quality remains poor. FINDINGS  Left Ventricle: Image quality remains  technically difficult, making wall motion evaluation challenging. There is global left ventricular hypokinesis, but there appears to be disproportionately severe inferolateral wall hypokinesis. Left ventricular ejection fraction, by estimation, is 40 to 45%. The left ventricle has mildly decreased function. The left ventricle has no regional wall motion abnormalities. The left ventricular internal cavity size was normal in size. There is no left ventricular hypertrophy. Normal left ventricular filling pressure. Right Ventricle: The right ventricular size is normal. No increase in right ventricular wall thickness. Right ventricular systolic function is normal. Left Atrium: Left atrial size was normal in size. Right Atrium: Right atrial size was normal in size. Pericardium: There is no evidence of pericardial effusion. Mitral Valve: The mitral valve is normal in structure. Normal mobility of the mitral valve leaflets. No evidence of mitral valve stenosis. Tricuspid Valve: The tricuspid valve is normal in structure. Tricuspid valve regurgitation is not demonstrated. No evidence of tricuspid stenosis. Aortic Valve: The aortic valve is normal in structure. Aortic valve regurgitation  is not visualized. No aortic stenosis is present. Pulmonic Valve: The pulmonic valve was normal in structure. Pulmonic valve regurgitation is trivial. No evidence of pulmonic stenosis. Aorta: The aortic root and ascending aorta are structurally normal, with no evidence of dilitation. Venous: The inferior vena cava is normal in size with greater than 50% respiratory variability, suggesting right atrial pressure of 3 mmHg. IAS/Shunts: No atrial level shunt detected by color flow Doppler.  LEFT VENTRICLE PLAX 2D LVIDd:         5.03 cm  Diastology LVIDs:         3.37 cm  LV e' lateral:   9.14 cm/s LV PW:         1.08 cm  LV E/e' lateral: 9.3 LV IVS:        1.13 cm LVOT diam:     2.40 cm LVOT Area:     4.52 cm  LEFT ATRIUM         Index LA diam:    3.30 cm 1.53 cm/m   AORTA Ao Root diam: 3.70 cm MITRAL VALVE MV Area (PHT): 2.74 cm     SHUNTS MV Decel Time: 277 msec     Systemic Diam: 2.40 cm MV E velocity: 84.90 cm/s MV A velocity: 124.00 cm/s MV E/A ratio:  0.68 Mihai Croitoru MD Electronically signed by Sanda Klein MD Signature Date/Time: 10/16/2019/3:56:53 PM    Final     Cardiac Studies   TTE 10/16/19: 1. Image quality remains technically difficult, making wall motion  evaluation challenging. There is global left ventricular hypokinesis, but  there appears to be disproportionately severe inferolateral wall  hypokinesis. Left ventricular ejection  fraction, by estimation, is 40 to 45%. The left ventricle has mildly  decreased function. The left ventricle has no regional wall motion  abnormalities. Left ventricular diastolic parameters are consistent with  Grade I diastolic dysfunction (impaired  relaxation).  2. Right ventricular systolic function is normal. The right ventricular  size is normal.  3. The mitral valve is normal in structure. No evidence of mitral valve  regurgitation. No evidence of mitral stenosis.  4. The aortic valve is normal in structure. Aortic valve regurgitation is    not visualized. No aortic stenosis is present.  5. The inferior vena cava is normal in size with greater than 50%  respiratory variability, suggesting right atrial pressure of 3 mmHg.   TTE 10/13/19: 1. Image quality is poor. There appears to be regional wall motion  variability, but confident  wall motion analysis is impossible, even after  contrast administration. Left ventricular ejection fraction, by  estimation, is 35 to 40%. The left ventricle has  moderately decreased function. The left ventricle demonstrates regional  wall motion abnormalities. Left ventricular diastolic parameters are  consistent with Grade I diastolic dysfunction (impaired relaxation).  2. Right ventricular systolic function is moderately reduced. The right  ventricular size is mildly enlarged. Tricuspid regurgitation signal is  inadequate for assessing PA pressure.  3. The mitral valve is normal in structure. No evidence of mitral valve  regurgitation.  4. The aortic valve was not well visualized. Aortic valve regurgitation  is not visualized. No aortic stenosis is present.   Patient Profile     86 y.o. male with a hx of CAD with PTCA 2000 to RCA, DM, HTN, HLDnow admitted with perforated duodenal ulcer and peritonitis, with cardiology consultation for NSVT and abnormal EKG. Echocardiogram demonstrates newly reduced ejection fraction 35 to 40%, and EKG with precordial T wave inversions.  Respiratory decompensation after extubation with hypertension and with elevated troponin 10/15/19, concerning for demand ischemia.   Assessment & Plan    NSTEMI: h/o CAD with hx of PTCA to RCA in year 2000, by report.  On 6/17, had elevated troponin ([id number]) in setting of respiratory decompensation requiring BiPAP.  EKG with intermittent left bundle branch block.  He continues to have difficulty clearing secretions, and demonstrates increased work of breathing. Could represent demand ischemia and a stress  cardiomyopathic process in the setting of respiratory distress and resolving sepsis, as well as postoperative status.  He denies any chest pain.  TTE with challenging views but EF 40-45% (global hypokinesis but disproportionately severe IL HK) -Continue heparin gtt -Had planned for possible cath on Monday.  I do not think it would be the best time to proceed with cath.  His respiratory status is tenuous and he is on 15 L HFNC.  Would likely have to be reintubated to proceed with cath.  In addition, he remains strict n.p.o. and unable to take pills.  If he did have significant obstructive CAD, would be unable to start DAPT at this time.  He denies any chest pain and troponin elevation could represent demand ischemia in setting of hypoxic respiratory failure.  I think that it would be reasonable to proceed with heart catheterization at some point, but would recommend holding off until both respiratory status and GI issues improved, as once he is able to lie flat without hypoxia and able to take PO, can proceed with cath.  Acute combined systolic and diastolic heart failure: TTE with EF 40-45% (global hypokinesis but disproportionately severe IL HK) -Will add GDMT as able.  Currently remains strict NPO -CVP 5, holding diuresis  Hypertension-BP has been significantly elevated which may be related to medical stressors and respiratory distress.  It has been challenging to give home medications due to NG to suction without opportunity for clamping.  IV beta-blocker, hydralazine have been given.  Recommend initiation of heart failure therapy for the benefit of systolic dysfunction as well as hypertension when able to take PO  NSVT in acute setting with stable K+ and Mg+. Started on amiodarone gtt.  Appears to have resolved.  Would favor stopping amiodarone given tenuous respiratory status to avoid any potential lung toxicity.    Sepsis due to peritonitis with hypotension, now off of vasopressors and has been  hypertensive  Acute respiratory failure -as above.  Extubated 10/15/2019.  Currently on 15L [place]  Acute ABD  with perforated duodenal ulcer and emergent exploratory lap and s/p omental patch repair.on ABX per surgery and CCM   For questions or updates, please contact [place] Please consult [email or web address] for contact info under        Signed, Donato Heinz, MD  10/18/2019, 6:31 AM

## 2019-10-18 NOTE — Progress Notes (Signed)
PT maintained appropriate Sp02 throughout night (on 30% and 20 LPM)- this morning removed PT from heated high flow nasal cannula and placed on [place] high flow nasal cannula at 15 lpm - current sp02 97% (no distress noted)- RN aware.

## 2019-10-18 NOTE — Progress Notes (Signed)
Assessed pt BUE due to DVT around PICC per report.  BUE edema present and equal, LA pulses strong, temperature equal, color equal, pt denies pain or discomfort to site.  PICC infusing without difficulty.  Spoke with Dr Melvyn Novas and Neoma Laming RN re leaving PICC in place.  Aware to assess for changes in circulation. Dtr at bedside aware as well.  Will d/c exchange PICC order per Dr Melvyn Novas.

## 2019-10-18 NOTE — Evaluation (Signed)
Physical Therapy Evaluation Patient Details Name: Matthew Schwartz MRN: [id number] DOB: 18-Jul-1932 Today's Date: 10/18/2019   History of Present Illness  Pt s/p exploratory lap (10/12/19) to repair perforated duodenal ulcer and then on vent with extubation 10/14/19.  Pt with acute respiratory failure with hypoxia and acute systolic CHF in setting of sepsis and peritonitis.  Pt with hx of DM, prostate CA and bil TKR  Clinical Impression  Pt admitted as above and presenting with functional mobility limitations 2* generalized weakness, post-op pain, balance deficits and limited endurance.  This date, pt assisted up to sitting at EOB and able to maintain balance with sup x 10 min but pt declines to attempt standing citing fatigue and c/o dizziness (BP 151/72).  Pt assisted back to bed max assist.  Pt hopes to progress to dc home with family assist.  Pt's dtr present and state she and her sister are from out of town but will be here to assist as long as parents need them.    Follow Up Recommendations Home health PT    Equipment Recommendations  Other (comment) (TBD - pt unclear on home equipment)    Recommendations for Other Services OT consult     Precautions / Restrictions Precautions Precautions: Fall Restrictions Weight Bearing Restrictions: No      Mobility  Bed Mobility Overal bed mobility: Needs Assistance Bed Mobility: Supine to Sit;Sit to Supine;Rolling Rolling: +2 for physical assistance;Mod assist;Max assist   Supine to sit: +2 for physical assistance;+2 for safety/equipment;Max assist Sit to supine: +2 for physical assistance;+2 for safety/equipment;Max assist   General bed mobility comments: Cues for sequence and correct log roll technique; Physical assist to manage LEs and to control trunk  Transfers                 General transfer comment: Pt up to EOB sitting and able to maintain balance with Supervision only but pt declines to attempt standing 2* fatigue and c/o  dizziness - BP 151/72  Ambulation/Gait                Stairs            Wheelchair Mobility    Modified Rankin (Stroke Patients Only)       Balance Overall balance assessment: Needs assistance Sitting-balance support: Bilateral upper extremity supported;Feet supported Sitting balance-Leahy Scale: Fair                                       Pertinent Vitals/Pain Pain Assessment: Faces Faces Pain Scale: Hurts little more Pain Location: abdomen Pain Descriptors / Indicators: Sore;Grimacing;Guarding Pain Intervention(s): Limited activity within patient's tolerance;Monitored during session;Premedicated before session    Home Living Family/patient expects to be discharged to:: Private residence Living Arrangements: Spouse/significant other Available Help at Discharge: Family Type of Home: House Home Access: Stairs to enter Entrance Stairs-Rails: Right;Left;Can reach both Technical brewer of Steps: 5 Home Layout: One level Home Equipment: None      Prior Function Level of Independence: Independent               Hand Dominance        Extremity/Trunk Assessment   Upper Extremity Assessment Upper Extremity Assessment: Generalized weakness    Lower Extremity Assessment Lower Extremity Assessment: Generalized weakness       Communication   Communication: Expressive difficulties (Limited verbalization with NG tube in place)  Cognition Arousal/Alertness: Lethargic;Suspect due  to medications Behavior During Therapy: Flat affect Overall Cognitive Status: Within Functional Limits for tasks assessed                                        General Comments      Exercises     Assessment/Plan    PT Assessment Patient needs continued PT services  PT Problem List Decreased strength;Decreased activity tolerance;Decreased balance;Decreased mobility;Decreased knowledge of use of DME;Pain       PT Treatment  Interventions DME instruction;Gait training;Stair training;Functional mobility training;Therapeutic activities;Therapeutic exercise;Patient/family education;Balance training    PT Goals (Current goals can be found in the Care Plan section)  Acute Rehab PT Goals Patient Stated Goal: Get stronger PT Goal Formulation: With patient Time For Goal Achievement: 11/01/19 Potential to Achieve Goals: Fair    Frequency Min 3X/week   Barriers to discharge        Co-evaluation               AM-PAC PT "6 Clicks" Mobility  Outcome Measure Help needed turning from your back to your side while in a flat bed without using bedrails?: A Lot Help needed moving from lying on your back to sitting on the side of a flat bed without using bedrails?: A Lot Help needed moving to and from a bed to a chair (including a wheelchair)?: A Lot Help needed standing up from a chair using your arms (e.g., wheelchair or bedside chair)?: A Lot Help needed to walk in hospital room?: A Lot Help needed climbing 3-5 steps with a railing? : Total 6 Click Score: 11    End of Session   Activity Tolerance: Patient limited by lethargy;Patient limited by fatigue Patient left: in bed;with call bell/phone within reach;with nursing/sitter in room;with bed alarm set;with family/visitor present Nurse Communication: Mobility status PT Visit Diagnosis: Difficulty in walking, not elsewhere classified (R26.2);Muscle weakness (generalized) (M62.81)    Time: 1859-0931 PT Time Calculation (min) (ACUTE ONLY): 38 min   Charges:   PT Evaluation $PT Eval Moderate Complexity: 1 Mod PT Treatments $Therapeutic Activity: 8-22 mins        Debe Coder PT Acute Rehabilitation Services Pager [phone] Office [phone]   Terre Hanneman 10/18/2019, 5:07 PM

## 2019-10-18 NOTE — Progress Notes (Signed)
ANTICOAGULATION CONSULT NOTE - [place] for Heparin Indication: chest pain/ACS/  Allergies  Allergen Reactions  . Losartan Potassium Other (See Comments)    Unknown reaction that happened 15 years ago    Patient Measurements: Height: 5\' 11"  (180.3 cm) (per daughter) Weight: 96.4 kg (212 lb 8.4 oz) IBW/kg (Calculated) : 75.3 Heparin Dosing Weight:   Vital Signs: Temp: 99.1 F (37.3 C) (06/20 0822) Temp Source: Oral (06/20 0822) BP: 165/66 (06/20 0600) Pulse Rate: 73 (06/20 0726)  Labs: Recent Labs    10/15/19 2314 10/16/19 0231 10/16/19 0231 10/16/19 0237 10/16/19 0500 10/16/19 0857 10/16/19 1713 10/17/19 0332 10/17/19 0741 10/18/19 0445  HGB  --  10.6*   < >  --   --   --   --  10.1*  --  10.5*  HCT  --  31.6*  --   --   --   --   --  29.9*  --  31.7*  PLT  --  144*  --   --   --   --   --  187  --  201  HEPARINUNFRC  --  <0.10*  --   --   --    < > 0.34  --  0.46 0.41  CREATININE  --  0.93  --   --   --   --   --  0.81  --  0.73  TROPONINIHS 1,022*  --   --  871* 776*  --   --   --   --   --    < > = values in this interval not displayed.    Estimated Creatinine Clearance: 81.4 mL/min (by C-G formula based on SCr of 0.73 mg/dL).   Medical History: Past Medical History:  Diagnosis Date  . Diabetes mellitus without complication ([place])   . Dyslipidemia   . GERD (gastroesophageal reflux disease)   . Hypertension   . Prostate cancer ([place]) 12/23/13   Adenocarcinoma  . PSA elevation   . S/P radiation therapy 05/18/2014 through 07/16/2014    Prostate 7800 cGy in 40 sessions, seminal vesicles 5600 cGy in 40 sessions     Medications:  Infusions:  . sodium chloride    . sodium chloride 10 mL/hr at 10/17/19 0218  . heparin 1,150 Units/hr (10/18/19 0826)  . piperacillin-tazobactam (ZOSYN)  IV Stopped (10/18/19 0751)  . TPN ADULT (ION) 95 mL/hr at 10/18/19 3716     Assessment: Patient with elevated troponin.  MD asking pharmacy to dose heparin for ACS.  Prior enoxaparin charted 6/17 ~11am.  Will reduce heparin bolus due to prior enoxaparin.  On  6/19 venous doppler LUE showed  acute deep vein thrombosis involving the left axillary vein, surrounding the PIC line. Findings consistent with acute superficial vein thrombosis involving the left cephalic vein.  Today, 10/18/19   HL is 0.41, therapeutic   Hgb 10.5, plt 201  Initial plan was  for pt undergo UGI on 6/19. But UGI on hold until pt stabilizes from CV/pulm standpoint   No line or bleeding issues per RN   Goal of Therapy:  Heparin level 0.3-0.7 units/ml Monitor platelets by anticoagulation protocol: Yes   Plan:   Continue heparin drip at 1150 units/hr  Daily CBC  Daily HL   Monitor for signs and symptoms of bleeding    Royetta Asal, PharmD, BCPS 10/18/2019 9:38 AM

## 2019-10-18 NOTE — Progress Notes (Signed)
6 Days Post-Op    CC: Abdominal pain  Subjective: Feeling better today, dyspnea improving. Hgb stable on heparin gtt. Denies n/v. Denies abdominal pain.   Objective: Vital signs in last 24 hours: Temp:  [97.5 F (36.4 C)-99.2 F (37.3 C)] 99.1 F (37.3 C) (06/20 0822) Pulse Rate:  [66-83] 73 (06/20 0726) Resp:  [22-32] 22 (06/20 0726) BP: (141-188)/(51-78) 165/66 (06/20 0600) SpO2:  [94 %-100 %] 100 % (06/20 0800) FiO2 (%):  [30 %-40 %] 30 % (06/20 0323) Last BM Date: 10/17/19  Intake/Output from previous day: 06/19 0701 - 06/20 0700 In: 2057.9 [I.V.:1756.8; IV Piggyback:301.2] Out: 1020 [Urine:1000; Drains:20] Intake/Output this shift: Total I/O In: 1791.4 [I.V.:1691.4; IV Piggyback:100] Out: -   General appearance: alert, cooperative and in no distress Resp: coarse crackles bilaterally GI: Soft, nondistended, not significantly tender. JP with ss fluid in bulb Extremities: extremities normal, atraumatic, no cyanosis or edema  Lab Results:  Recent Labs    10/17/19 0332 10/18/19 0445  WBC 10.3 15.0*  HGB 10.1* 10.5*  HCT 29.9* 31.7*  PLT 187 201    BMET Recent Labs    10/17/19 0332 10/18/19 0445  NA 134* 139  K 3.1* 4.0  CL 93* 99  CO2 32 28  GLUCOSE 332* 206*  BUN 33* 33*  CREATININE 0.81 0.73  CALCIUM 7.6* 8.4*   PT/INR No results for input(s): LABPROT, INR in the last 72 hours.  Recent Labs  Lab 10/13/19 0230 10/14/19 0409 10/15/19 0339 10/16/19 0231 10/17/19 0332  AST 31 21 28  33 29  ALT 25 17 18 31  35  ALKPHOS 26* 33* 43 71 79  BILITOT 1.9* 0.9 0.9 0.9 1.0  PROT 4.8* 4.7* 4.8* 5.3* 5.4*  ALBUMIN 2.9* 2.3* 2.0* 2.0* 2.0*     Lipase     Component Value Date/Time   LIPASE 44 10/12/2019 0320     Medications: . Chlorhexidine Gluconate Cloth  6 each Topical Daily  . cloNIDine  0.2 mg Transdermal Weekly  . insulin aspart  0-15 Units Subcutaneous Q4H  . insulin detemir  10 Units Subcutaneous BID  . mouth rinse  15 mL Mouth Rinse BID   . metoprolol tartrate  5 mg Intravenous Q4H  . nitroGLYCERIN  1 inch Topical Q6H  . pantoprazole (PROTONIX) IV  40 mg Intravenous Q12H  . sodium chloride flush  10-40 mL Intracatheter Q12H   . sodium chloride    . sodium chloride 10 mL/hr at 10/17/19 0218  . heparin 1,150 Units/hr (10/18/19 0826)  . piperacillin-tazobactam (ZOSYN)  IV Stopped (10/18/19 0751)  . TPN ADULT (ION) 95 mL/hr at 10/18/19 8299    Assessment/Plan Remote hx of prostate cancer s/p radiation HTN - off pressors HLD GERD - Protonix DM2 VT  - on amio drip Hyperglycemia - SSI Hypokalemia   Severe Protein calorie malnutrition - Pre-alb < 5. TPN  Perforated Duodenal Ulcer S/p Exploratory laparotomy, omental patch repair of duodenal ulcer - Dr. [name] - 10/12/2019 - POD #6 - Maintain JP drain. No further bile in drain.  - TPN - Continue NGT today; discussed case with Dr. Melvyn Novas. Should be stable for UGI. If clear and no leak, will need speech for swallow evaluation - [facility] once more stable from CV/pulm standpoint - We will follow along closely. Appreciate CCM assistance  FEN -NPO, NGT, IVF, TPN VTE - SCDs ID -Zosyn 6/14 >> day 5 Foley - in place Follow-Up - as per primary  Plan: UGI today  LOS: 6 days   Nadeen Landau, M.D. [facility] Use [email or web address] to contact on call provider

## 2019-10-18 NOTE — Progress Notes (Signed)
PHARMACY - TOTAL PARENTERAL NUTRITION CONSULT NOTE   Indication: Prolonged ileus  Patient Measurements: Height: 5\' 11"  (180.3 cm) (per daughter) Weight: 96.4 kg (212 lb 8.4 oz) IBW/kg (Calculated) : 75.3   Body mass index is 29.64 kg/m. Usual Weight: 90.7 kg   Assessment:  - Pharmacy is consulted to start TPN in 86 yo male with prolonged ileus. Pt underwent exploratory laparotomy on 6/14 which revealed perforated duodenal ulcer.   Glucose / Insulin: hx of DM - on metformin PTA.  - CBGs elevated on mSSI q4h (range 201- 275; goal 100-150) - 36 units of insulin yesterday - Pt ordered Levemir 10 units BID per CCM 6/18  - 6/19 Added 25 units of regular insulin to TPN Electrolytes: K (target > 4.0) is 4.0  . Magnesium 2.4 ( target > 2.0) ; phos 2.5; Corr Ca is 10 WNL , Na now 139   Renal: SCr now WNL,  BUN elevated; bicarb WNL, UOP increased after receiving Lasix LFTs / TGs: LFTs remain WNL, Tbili improved to WNL after procedure; TG are now WNL after stopping propofol. Previously elevated level likely spurious  Prealbumin / albumin: prealbumin undetectable; albumin 2.3 ( 6/16) 2.0 ( 6/17, 6/18, 6/19)  Intake / Output; MIVF: + 1037 yesterday; on NS at [place]:  Surgeries / Procedures:  - 6/14 exploratory laparotomy on 6/14 which revealed perforated duodenal ulcer.    Central access:  PICC Line 6/15 TPN start date: 6/15  Nutritional Goals: Pending RD recs  (per RD recommendation on 6/15): Kcal:  2200-2400 Protein:  115-125 grams Fluid:  >2L/d Goal TPN rate is 95 mL/hr (provides 121 g of protein and 2262 kcals per day)    Current Nutrition:  NPO    Plan:   Potassium Phosphate 20  mmol IV x 1 already ordered by MD   Potassium chloride 10 meq IV x 4  Magnesium 4 gr IV x1      Continue TPN to 95 mL/hr at 1800, the target rate   Electrolytes in TPN: 80 mEq/L of Na, 80 mEq/L of K( max in TPN per [facility] policy) , 4 mEq/L of Ca, 10 mEq/L of Magnesium, and 10 mmol/L of  Phos. Cl:Ac ratio max Ac  Standard MVI and trace elements to TPN  Increase SSI to  Resistant  q4h SSI, and monitor Levemir and adjust as needed   Will add 45 units of regular insulin to TPN   Continue MIVF to KVO mL/hr at 1800  Monitor TPN labs on Mon/Thurs;      Royetta Asal, PharmD, BCPS 10/18/2019 9:47 AM

## 2019-10-19 ENCOUNTER — Inpatient Hospital Stay (HOSPITAL_COMMUNITY): Payer: Medicare Other

## 2019-10-19 DIAGNOSIS — R109 Unspecified abdominal pain: Secondary | ICD-10-CM

## 2019-10-19 LAB — COMPREHENSIVE METABOLIC PANEL
ALT: 38 U/L (ref 0–44)
AST: 25 U/L (ref 15–41)
Albumin: 2 g/dL — ABNORMAL LOW (ref 3.5–5.0)
Alkaline Phosphatase: 73 U/L (ref 38–126)
Anion gap: 9 (ref 5–15)
BUN: 34 mg/dL — ABNORMAL HIGH (ref 8–23)
CO2: 27 mmol/L (ref 22–32)
Calcium: 7.8 mg/dL — ABNORMAL LOW (ref 8.9–10.3)
Chloride: 103 mmol/L (ref 98–111)
Creatinine, Ser: 0.82 mg/dL (ref 0.61–1.24)
GFR calc Af Amer: >60 mL/min (ref >60)
GFR calc non Af Amer: >60 mL/min (ref >60)
Glucose, Bld: 161 mg/dL — ABNORMAL HIGH (ref 70–99)
Potassium: 4 mmol/L (ref 3.5–5.1)
Sodium: 139 mmol/L (ref 135–145)
Total Bilirubin: 0.9 mg/dL (ref 0.3–1.2)
Total Protein: 5.6 g/dL — ABNORMAL LOW (ref 6.5–8.1)

## 2019-10-19 LAB — CBC WITH DIFFERENTIAL/PLATELET
Abs Immature Granulocytes: 0.77 K/uL — ABNORMAL HIGH (ref 0.00–0.07)
Basophils Absolute: 0 K/uL (ref 0.0–0.1)
Basophils Relative: 0 %
Eosinophils Absolute: 0.3 K/uL (ref 0.0–0.5)
Eosinophils Relative: 2 %
HCT: 29.3 % — ABNORMAL LOW (ref 39.0–52.0)
Hemoglobin: 9.7 g/dL — ABNORMAL LOW (ref 13.0–17.0)
Immature Granulocytes: 5 %
Lymphocytes Relative: 10 %
Lymphs Abs: 1.7 K/uL (ref 0.7–4.0)
MCH: 25.7 pg — ABNORMAL LOW (ref 26.0–34.0)
MCHC: 33.1 g/dL (ref 30.0–36.0)
MCV: 77.7 fL — ABNORMAL LOW (ref 80.0–100.0)
Monocytes Absolute: 1 K/uL (ref 0.1–1.0)
Monocytes Relative: 6 %
Neutro Abs: 12.9 K/uL — ABNORMAL HIGH (ref 1.7–7.7)
Neutrophils Relative %: 77 %
Platelets: 227 K/uL (ref 150–400)
RBC: 3.77 MIL/uL — ABNORMAL LOW (ref 4.22–5.81)
RDW: 16.6 % — ABNORMAL HIGH (ref 11.5–15.5)
WBC: 16.7 K/uL — ABNORMAL HIGH (ref 4.0–10.5)
nRBC: 0 % (ref 0.0–0.2)

## 2019-10-19 LAB — TRIGLYCERIDES: Triglycerides: 97 mg/dL (ref <150)

## 2019-10-19 LAB — GLUCOSE, CAPILLARY
Glucose-Capillary: 154 mg/dL — ABNORMAL HIGH (ref 70–99)
Glucose-Capillary: 140 mg/dL — ABNORMAL HIGH (ref 70–99)
Glucose-Capillary: 149 mg/dL — ABNORMAL HIGH (ref 70–99)
Glucose-Capillary: 140 mg/dL — ABNORMAL HIGH (ref 70–99)
Glucose-Capillary: 140 mg/dL — ABNORMAL HIGH (ref 70–99)

## 2019-10-19 LAB — PHOSPHORUS: Phosphorus: 2.5 mg/dL (ref 2.5–4.6)

## 2019-10-19 LAB — HEPARIN LEVEL (UNFRACTIONATED)
Heparin Unfractionated: >2.2 IU/mL — ABNORMAL HIGH (ref 0.30–0.70)
Heparin Unfractionated: 0.39 IU/mL (ref 0.30–0.70)

## 2019-10-19 LAB — MAGNESIUM: Magnesium: 2.3 mg/dL (ref 1.7–2.4)

## 2019-10-19 LAB — PREALBUMIN: Prealbumin: 9 mg/dL — ABNORMAL LOW (ref 18–38)

## 2019-10-19 MED ORDER — TRAVASOL 10 % IV SOLN
INTRAVENOUS | Status: AC
  Filled 2019-10-19: qty 1208.4

## 2019-10-19 MED ORDER — IOHEXOL 300 MG/ML  SOLN
300.0000 mL | Freq: Once | INTRAMUSCULAR | Status: AC | PRN
  Administered 2019-10-19: 300 mL

## 2019-10-19 MED ORDER — FENTANYL CITRATE (PF) 100 MCG/2ML IJ SOLN
50.0000 ug | INTRAMUSCULAR | Status: DC | PRN
  Administered 2019-10-19 – 2019-10-24 (×35): 50 ug via INTRAVENOUS
  Filled 2019-10-19 (×36): qty 2

## 2019-10-19 NOTE — Progress Notes (Signed)
7 Days Post-Op    CC: Abdominal pain  Subjective: Feeling reasonably well, dyspnea still present. Denies n/v. Denies abdominal pain.   Objective: Vital signs in last 24 hours: Temp:  [97.6 F (36.4 C)-99.3 F (37.4 C)] 97.6 F (36.4 C) (06/21 0800) Pulse Rate:  [72-91] 81 (06/21 0900) Resp:  [20-31] 30 (06/21 0900) BP: (130-179)/(52-81) 143/81 (06/21 0900) SpO2:  [97 %-100 %] 100 % (06/21 0900) Last BM Date: 10/19/19  Intake/Output from previous day: 06/20 0701 - 06/21 0700 In: 3746.8 [I.V.:3547.6; IV Piggyback:199.2] Out: 1430 [Urine:1400; Drains:30] Intake/Output this shift: Total I/O In: -  Out: 25 [Drains:25]  General appearance: alert, cooperative and in no distress Resp: coarse crackles bilaterally GI: Soft, nondistended, not significantly tender. JP with ss fluid in bulb Extremities: extremities normal, atraumatic, no cyanosis or edema  Lab Results:  Recent Labs    10/18/19 0445 10/19/19 0248  WBC 15.0* 16.7*  HGB 10.5* 9.7*  HCT 31.7* 29.3*  PLT 201 227    BMET Recent Labs    10/18/19 0445 10/19/19 0248  NA 139 139  K 4.0 4.0  CL 99 103  CO2 28 27  GLUCOSE 206* 161*  BUN 33* 34*  CREATININE 0.73 0.82  CALCIUM 8.4* 7.8*   PT/INR No results for input(s): LABPROT, INR in the last 72 hours.  Recent Labs  Lab 10/14/19 0409 10/15/19 0339 10/16/19 0231 10/17/19 0332 10/19/19 0248  AST 21 28 33 29 25  ALT 17 18 31  35 38  ALKPHOS 33* 43 71 79 73  BILITOT 0.9 0.9 0.9 1.0 0.9  PROT 4.7* 4.8* 5.3* 5.4* 5.6*  ALBUMIN 2.3* 2.0* 2.0* 2.0* 2.0*     Lipase     Component Value Date/Time   LIPASE 44 10/12/2019 0320     Medications:  Chlorhexidine Gluconate Cloth  6 each Topical Daily   cloNIDine  0.2 mg Transdermal Weekly   insulin aspart  0-20 Units Subcutaneous Q4H   insulin detemir  10 Units Subcutaneous BID   mouth rinse  15 mL Mouth Rinse BID   metoprolol tartrate  5 mg Intravenous Q4H   nitroGLYCERIN  1 inch Topical Q6H    pantoprazole (PROTONIX) IV  40 mg Intravenous Q12H   sodium chloride flush  10-40 mL Intracatheter Q12H    sodium chloride     sodium chloride 10 mL/hr at 10/17/19 0218   heparin 1,150 Units/hr (10/19/19 0200)   piperacillin-tazobactam (ZOSYN)  IV 3.375 g (10/19/19 0434)   TPN ADULT (ION) 95 mL/hr at 10/19/19 0200   TPN ADULT (ION)      Assessment/Plan Remote hx of prostate cancer s/p radiation HTN - off pressors HLD GERD - Protonix DM2 VT  - on amio drip Hyperglycemia - SSI Hypokalemia   Severe Protein calorie malnutrition - Pre-alb < 5. TPN  Perforated Duodenal Ulcer S/p Exploratory laparotomy, omental patch repair of duodenal ulcer - Dr. [name] - 10/12/2019 - POD #7 - Maintain JP drain - TPN - Continue NGT today - UGI. If clear and no leak, will need speech for swallow evaluation - [facility] once more stable from CV/pulm standpoint - We will follow along closely. Appreciate CCM assistance  FEN -NPO, NGT, IVF, TPN VTE - SCDs ID -Zosyn 6/14 >> day 7 - no further need from abdominal standpoint; will defer to CCM Foley - per CCM Follow-Up - as per primary  Plan: UGI today      LOS: 7 days   Harrell Gave  Dema Severin, M.D. [facility] Use [email or web address] to contact on call provider

## 2019-10-19 NOTE — Progress Notes (Signed)
Elevated heparin level greater than 2.20. Consulted with pharmacist and will have lab collect as draw from PICC line may have given false reading. Will monitor especially since patient did have drop in hemoglobin and bloody stool.

## 2019-10-19 NOTE — Progress Notes (Addendum)
NAME:  Matthew Schwartz, MRN:  [id number], DOB:  01/13/1933, LOS: 7 ADMISSION DATE:  10/12/2019, CONSULTATION DATE:  10/12/2019 REFERRING MD:  Dr. [name], Surgery, CHIEF COMPLAINT:  Abdominal pain  Brief History   86 y/o male, former smoker, presented with nausea/vomiting, abdominal pain and constipation x 4 to 5 days.  Seen twice prior to current admit for the same with imaging studies unremarkable for cause.  On return 6/14, seen by GI and found to have concern for acute abdomen and taken for exploratory laparotomy.  Found to have perforated duodenal ulcer s/p omental patch repair.  Remained on vent post op.  PCCM consulted for critical care management.  Past Medical History  Prostate cancer, HTN, GERD, HLD, DM  Significant Hospital Events   6/14 Admit, exploratory laparotomy, omental patch repair of duodenal ulcer 6/16 Off vasopressors, less drainage from abd drain / clearing  6/17 extubated, weak cough, added high flow oxygen 6/18 start heparin gtt 6/20 d/c amiodarone  Consults:  Gastroenterology General surgery Cardiology  Procedures:  Rt IJ CVL 6/14 >> 6/15 Rt radial a line 6/14 >> 6/17 ETT 6/14 >> 6/17 LUE PICC 6/16 >>     Significant Diagnostic Tests:  CT angio abd/pelvis 6/13 >> mild atherosclerosis, no significant arterial stenosis of mesenteric vasculature, no findings of bowel ischemia, colonic diverticulosis w/o diverticulitis, b/l renal cysts, 12 mm lesion upper pole Rt kidney ECHO 6/15 >> poor image quality, appears to be regional wall motion variability but confident wall motion analysis is impossible even after contrast administration, LVEF ~ 46-50%, Grade I diastolic dysfunction, RV systolic function is moderately reduced, RV is mildly enlarged 6/19 venous doppler LUE >> acute deep vein thrombosis involving the left axillary vein, surrounding the PICC line. Findings consistent with acute superficial vein thrombosis involving the left cephalic vein. UGI 6/21 >>  Micro  Data:  SARS CoV2 6/13 >> negative UC 6/14 >> less than 10k insignificant growth   Antimicrobials:  Zosyn 6/14 >>  Interim history/subjective:  Afebrile / Tmax 99.2 / WBC 16.7 (note rising trend) Glucose range 150-190 I/O 1.4L UOP, 2.3L+ in last 24 hours  UGI pending  Pending transfer to [facility] at some point for Cards evaluation  RN reports pt had concern for small amt bleeding from surgical incision, JP drain, BM last night.  Am 6/21 BM with brown stool / no evidence of bleeding   Objective   Blood pressure (!) 146/62, pulse 80, temperature 97.6 F (36.4 C), temperature source Oral, resp. rate (!) 25, height 5\' 11"  (1.803 m), weight 96.4 kg, SpO2 99 %.        Intake/Output Summary (Last 24 hours) at 10/19/2019 1100 Last data filed at 10/19/2019 3546 Gross per 24 hour  Intake 2807.16 ml  Output 1455 ml  Net 1352.16 ml   Filed Weights   10/13/19 0923  Weight: 96.4 kg           Examination: General: ill appearing elderly male lying in bed in NAD HEENT: MM pink/dry, NGT in place, anicteric  Neuro: Awake, alert, generalized weakness  CV: s1s2 SR with frequent PVC's, no m/r/g PULM: non-labored on [place] O2, lungs bilaterally diminished, scattered rhonchi  GI: soft, bsx4 active  Extremities: warm/dry, 1+ BLE edema  Skin: no rashes or lesions  Resolved Hospital Problem list   Septic shock  Assessment & Plan:   Peritonitis in setting of perforated duodenal ulcer s/p omental patch repair. -continue abx as above -UGI pending, per CCS if passes will proceed with SLP  evaluation  -protinix BID -TPN per CCS  Acute respiratory failure with hypoxia in setting of sepsis and peritonitis. Extubated 6/17.   -encourage pulmonary hygiene - IS, mobilize -no BiPAP with nausea  -HFNC O2 to support sats >94% -if evidence of respiratory muscle fatigue, intubate  -follow intermittent CXR     NSVT Acute Systolic CHF in setting of Sepsis likely from Demand Ischemia. Hx of CAD, HTN,  HLD. -heparin gtt per pharmacy, continue lower level therapeutic  -monitor for evidence of bleeding, change in Hgb  -continue IV lopressor, NTG / clonidine patch  -SBP goal <140  -appreciate Cardiology evaluation > transfer on hold, Cards will consider LHC when he is able to lie flat without hypoxia  -hold home norvasc, toprol, imdur, altace, ASA, lipitor as NPO   LUE DVT  Cephalic vein, axillary vein involvement  -continue PICC for now, treat with IV heparin gtt   DM type II poorly controlled with hyperglycemia. -SSI  -levemir 10 units BID  -hold home metformin as NPO   Anemia of critical illness. -trend CBC  -transfuse for Hgb <7% or active bleeding   Best practice:  Diet: TPN DVT prophylaxis: heparin gtt GI prophylaxis: Protonix Mobility: Bed rest / mobilize tolerated  Code Status: full code Disposition: ICU Communication: Daughter updated at bedside 6/21     Labs    CMP Latest Ref Rng & Units 10/19/2019 10/18/2019 10/17/2019  Glucose 70 - 99 mg/dL 161(H) 206(H) 332(H)  BUN 8 - 23 mg/dL 34(H) 33(H) 33(H)  Creatinine 0.61 - 1.24 mg/dL 0.82 0.73 0.81  Sodium 135 - 145 mmol/L 139 139 134(L)  Potassium 3.5 - 5.1 mmol/L 4.0 4.0 3.1(L)  Chloride 98 - 111 mmol/L 103 99 93(L)  CO2 22 - 32 mmol/L 27 28 32  Calcium 8.9 - 10.3 mg/dL 7.8(L) 8.4(L) 7.6(L)  Total Protein 6.5 - 8.1 g/dL 5.6(L) - 5.4(L)  Total Bilirubin 0.3 - 1.2 mg/dL 0.9 - 1.0  Alkaline Phos 38 - 126 U/L 73 - 79  AST 15 - 41 U/L 25 - 29  ALT 0 - 44 U/L 38 - 35    CBC Latest Ref Rng & Units 10/19/2019 10/18/2019 10/17/2019  WBC 4.0 - 10.5 K/uL 16.7(H) 15.0(H) 10.3  Hemoglobin 13.0 - 17.0 g/dL 9.7(L) 10.5(L) 10.1(L)  Hematocrit 39 - 52 % 29.3(L) 31.7(L) 29.9(L)  Platelets 150 - 400 K/uL 227 201 187    ABG    Component Value Date/Time   PHART 7.406 10/15/2019 2112   PCO2ART 40.7 10/15/2019 2112   PO2ART 60.7 (L) 10/15/2019 2112   HCO3 25.1 10/15/2019 2112   TCO2 22 10/12/2019 1717   ACIDBASEDEF 2.6 (H)  10/13/2019 0311   O2SAT 91.4 10/15/2019 2112    CBG (last 3)  Recent Labs    10/18/19 2341 10/19/19 0418 10/19/19 0804  GLUCAP Viburnum, MSN, NP-C [facility] 10/19/2019, 11:00 AM   Please see [email or web address] for pager details.

## 2019-10-19 NOTE — Progress Notes (Signed)
Ongoing issues with right leg pain requiring pain medication and heat packs for patient comfort.

## 2019-10-19 NOTE — Progress Notes (Signed)
PHARMACY - TOTAL PARENTERAL NUTRITION CONSULT NOTE   Indication: Prolonged ileus  Patient Measurements: Height: 5\' 11"  (180.3 cm) (per daughter) Weight: 96.4 kg (212 lb 8.4 oz) IBW/kg (Calculated) : 75.3   Body mass index is 29.64 kg/m. Usual Weight: 90.7 kg   Assessment:  - Pharmacy is consulted to start TPN in 86 yo male with prolonged ileus. Pt underwent exploratory laparotomy on 6/14 which revealed perforated duodenal ulcer.   Glucose / Insulin: hx of DM - on metformin PTA.  - CBGs elevated but improved after increasing insulin in TPN, on rSSI q4h (range 154-191; goal 100-150) - 25 units of SS insulin required yesterday - Pt ordered Levemir 10 units BID per CCM 6/18  - 6/19 Added 25 units of regular insulin to TPN, increased 45 units 6/20 Electrolytes: K (target > 4.0) is 4.0. Magnesium 2.3 (target > 2.0) ; phos 2.5; Corr Ca is 9.4 WNL , Na now 139  Renal: SCr now WNL,  BUN elevated; bicarb WNL, UOP increased after receiving Lasix 6/16-6/17 LFTs / TGs: LFTs remain WNL, Tbili improved to WNL after procedure; TG are now WNL after stopping propofol. Previously elevated level likely spurious  Prealbumin / albumin: prealbumin <5 (6/16), 9.0 (6/21); albumin 2  Intake / Output; MIVF: +2316 yesterday; on NS at [place]:  Surgeries / Procedures:  - 6/14 exploratory laparotomy which revealed perforated duodenal ulcer.   Central access:  PICC Line 6/15 TPN start date: 6/15  Nutritional Goals: Pending RD recs  (per RD recommendation on 6/15): Kcal:  2200-2400 Protein:  115-125 grams Fluid:  >2L/d Goal TPN rate is 95 mL/hr (provides 121 g of protein and 2262 kcals per day)  Current Nutrition:  NPO   Plan:    Continue TPN to 95 mL/hr at 1800, the target rate   Electrolytes in TPN: 80 mEq/L of Na, 80 mEq/L of K (max in TPN per [facility] policy) , 4 mEq/L of Ca, 10 mEq/L of Magnesium, and 10 mmol/L of Phos. Cl:Ac ratio max Ac  Standard MVI and trace elements to TPN  Continue  SSI resistant  q4h SSI, and monitor Levemir and adjust as needed   Continue 45 units of regular insulin to TPN   Continue MIVF to KVO mL/hr at 1800  Monitor TPN labs on Mon/Thurs;    Peggyann Juba, PharmD, BCPS Pharmacy: [phone] 10/19/2019 7:32 AM

## 2019-10-19 NOTE — TOC Progression Note (Signed)
Transition of Care [facility]) - Progression Note    Patient Details  Name: Matthew Schwartz MRN: [id number] Date of Birth: 08/12/1932  [facility] CM/SW Contact  Leeroy Cha, RN Phone Number: 10/19/2019, 9:35 AM  Clinical Narrative:    Pod 7,small resection, iv tpn, iv abx iv heparin.  Progressing well.  Plan: will follow for toc needs.  Family is very involved.  Expected Discharge Plan: Home/Self Care Barriers to Discharge: Continued Medical Work up  Expected Discharge Plan and Services Expected Discharge Plan: Home/Self Care   Discharge Planning Services: CM Consult   Living arrangements for the past 2 months: Single Family Home                                       Social Determinants of Health (SDOH) Interventions    Readmission Risk Interventions No flowsheet data found.

## 2019-10-19 NOTE — Progress Notes (Signed)
ANTICOAGULATION CONSULT NOTE - Follow Up Consult  Pharmacy Consult for Heparin Indication: chest pain/ACS  Allergies  Allergen Reactions  . Losartan Potassium Other (See Comments)    Unknown reaction that happened 15 years ago    Patient Measurements: Height: 5\' 11"  (180.3 cm) (per daughter) Weight: 96.4 kg (212 lb 8.4 oz) IBW/kg (Calculated) : 75.3 Heparin Dosing Weight:   Vital Signs: Temp: 99.2 F (37.3 C) (06/21 0400) Temp Source: Axillary (06/21 0400) BP: 148/60 (06/21 0500) Pulse Rate: 73 (06/21 0500)  Labs: Recent Labs    10/17/19 0332 10/17/19 0741 10/18/19 0445 10/19/19 0248 10/19/19 0650  HGB 10.1*  --  10.5* 9.7*  --   HCT 29.9*  --  31.7* 29.3*  --   PLT 187  --  201 227  --   HEPARINUNFRC  --    < > 0.41 >2.20* 0.39  CREATININE 0.81  --  0.73 0.82  --    < > = values in this interval not displayed.    Estimated Creatinine Clearance: 79.4 mL/min (by C-G formula based on SCr of 0.82 mg/dL).   Medications:  Infusions:  . sodium chloride    . sodium chloride 10 mL/hr at 10/17/19 0218  . heparin 1,150 Units/hr (10/19/19 0200)  . piperacillin-tazobactam (ZOSYN)  IV 3.375 g (10/19/19 0434)  . TPN ADULT (ION) 95 mL/hr at 10/19/19 0200    Assessment: 86 yo male with perforated duodenal ulcer s/p omental patch repair.  Pharmacy consulted to dose IV heparin for NSTEMI as well as for acute deep vein thrombosis involving the left axillary vein, surrounding the PIC line. Findings consistent with acute superficial vein thrombosis involving the left cephalic vein.  Today, 10/19/2019 Patient with >2.2 heparin level.  RN called to alert RPh re: heparin level.  RN reports drawing level from 2nd lumen of central port.  Heparin is running in other lumen.  RN also reports patient with bloody stool.  Hgb also decrease < 1 gm as noted above.  Patient's heparin rate and prior levels have been very stable at goal.   All the data does not appear to correlate with high  level.   Recheck heparin level therapeutic 0.39  Hgb 9.7, Plts 227  Goal of Therapy:  Heparin level 0.3-0.7 units/ml Monitor platelets by anticoagulation protocol: Yes   Plan:  Continue heparin drip at current rate 1150 units/hr Follow up with MD re: bloody stool  Peggyann Juba, PharmD, BCPS Pharmacy: [phone] 10/19/2019,7:19 AM

## 2019-10-19 NOTE — Progress Notes (Signed)
Progress Note  Patient Name: Matthew Schwartz Date of Encounter: 10/19/2019  [facility] Cardiologist: Candee Furbish, MD   Subjective   Today with abd pain, has NG and also JP drain.  No chest pain   UGI has been done.    Inpatient Medications    Scheduled Meds: . Chlorhexidine Gluconate Cloth  6 each Topical Daily  . cloNIDine  0.2 mg Transdermal Weekly  . insulin aspart  0-20 Units Subcutaneous Q4H  . insulin detemir  10 Units Subcutaneous BID  . mouth rinse  15 mL Mouth Rinse BID  . metoprolol tartrate  5 mg Intravenous Q4H  . nitroGLYCERIN  1 inch Topical Q6H  . pantoprazole (PROTONIX) IV  40 mg Intravenous Q12H  . sodium chloride flush  10-40 mL Intracatheter Q12H   Continuous Infusions: . sodium chloride    . sodium chloride 10 mL/hr at 10/17/19 0218  . heparin 1,150 Units/hr (10/19/19 0934)  . piperacillin-tazobactam (ZOSYN)  IV 3.375 g (10/19/19 1208)  . TPN ADULT (ION) 95 mL/hr at 10/19/19 0934  . TPN ADULT (ION)     PRN Meds: sodium chloride, acetaminophen **OR** acetaminophen, fentaNYL (SUBLIMAZE) injection, hydrALAZINE, lip balm, ondansetron (ZOFRAN) IV, sodium chloride flush   Vital Signs    Vitals:   10/19/19 1200 10/19/19 1300 10/19/19 1400 10/19/19 1500  BP: (!) 163/77 (!) 154/70 (!) 158/75 (!) 148/65  Pulse: 80 86 84 78  Resp: (!) 27 (!) 30 (!) 28 (!) 32  Temp: 98.3 F (36.8 C)     TempSrc: Oral     SpO2: 100% 98% 100% 100%  Weight:      Height:        Intake/Output Summary (Last 24 hours) at 10/19/2019 1557 Last data filed at 10/19/2019 0934 Gross per 24 hour  Intake 2807.16 ml  Output 1455 ml  Net 1352.16 ml   Last 3 Weights 10/13/2019 10/09/2019 07/22/2019  Weight (lbs) 212 lb 8.4 oz 200 lb 206 lb  Weight (kg) 96.4 kg 90.719 kg 93.441 kg      Telemetry    SR with occ PVCs no VT  - Personally Reviewed  ECG    No new - Personally Reviewed  Physical Exam   GEN: No acute distress.  Though some abd pain. Neck: No JVD Cardiac: RRR, no  murmurs, rubs, or gallops.  Respiratory: Clear to auscultation bilaterally. Though congested cough GI: Soft, nontender, non-distended  MS: No edema; No deformity. Neuro:  Nonfocal  Psych: Normal affect   Labs    High Sensitivity Troponin:   Recent Labs  Lab 10/13/19 1014 10/15/19 2124 10/15/19 2314 10/16/19 0237 10/16/19 0500  TROPONINIHS 55* 659* 1,022* 871* 776*      Chemistry Recent Labs  Lab 10/16/19 0231 10/16/19 0231 10/17/19 0332 10/18/19 0445 10/19/19 0248  NA 138   < > 134* 139 139  K 3.6   < > 3.1* 4.0 4.0  CL 104   < > 93* 99 103  CO2 25   < > 32 28 27  GLUCOSE 268*   < > 332* 206* 161*  BUN 36*   < > 33* 33* 34*  CREATININE 0.93   < > 0.81 0.73 0.82  CALCIUM 7.7*   < > 7.6* 8.4* 7.8*  PROT 5.3*  --  5.4*  --  5.6*  ALBUMIN 2.0*  --  2.0*  --  2.0*  AST 33  --  29  --  25  ALT 31  --  35  --  38  ALKPHOS 71  --  79  --  73  BILITOT 0.9  --  1.0  --  0.9  GFRNONAA >60   < > >60 >60 >60  GFRAA >60   < > >60 >60 >60  ANIONGAP 9   < > 9 12 9    < > = values in this interval not displayed.     Hematology Recent Labs  Lab 10/17/19 0332 10/18/19 0445 10/19/19 0248  WBC 10.3 15.0* 16.7*  RBC 3.83* 4.07* 3.77*  HGB 10.1* 10.5* 9.7*  HCT 29.9* 31.7* 29.3*  MCV 78.1* 77.9* 77.7*  MCH 26.4 25.8* 25.7*  MCHC 33.8 33.1 33.1  RDW 16.4* 16.3* 16.6*  PLT 187 201 227    BNPNo results for input(s): BNP, PROBNP in the last 168 hours.   DDimer No results for input(s): DDIMER in the last 168 hours.   Radiology    DG Chest Port 1 View  Result Date: 10/19/2019 CLINICAL DATA:  Acute respiratory failure with hypoxemia. EXAM: PORTABLE CHEST 1 VIEW COMPARISON:  One-view chest x-ray 10/17/2019 FINDINGS: Heart is mildly enlarged. This is exaggerated by low lung volumes. Bibasilar airspace opacities are present. Effusions are noted. Aeration is slightly improved. NG tube courses off the inferior border of the film. Left-sided PICC line is stable. IMPRESSION: Slight  improvement in aeration with persistent bibasilar airspace disease and effusions. Electronically Signed   By: San Morelle M.D.   On: 10/19/2019 08:12   DG UGI W SINGLE CM (SOL OR THIN BA)  Result Date: 10/19/2019 CLINICAL DATA:  Bleeding duodenal ulcer repair 5 days ago. Evaluate for leak. EXAM: WATER SOLUBLE UPPER GI SERIES TECHNIQUE: Single-column upper GI series was performed using water soluble contrast. CONTRAST:  300 cc Omnipaque 300. COMPARISON:  None. FLUOROSCOPY TIME:  Fluoroscopy Time:  3 minutes 6 seconds Radiation Exposure Index (if provided by the fluoroscopic device): 87 mGy Number of Acquired Spot Images: 13 FINDINGS: Scout view of the abdomen shows a nasogastric tube terminating in the stomach. Surgical drain terminates in the left upper quadrant. Stool is seen in the colon. Bibasilar volume loss in the lung bases. 300 cc Omnipaque 300 were hand injected through the patient's nasogastric tube. Stomach opacified. With filling of the duodenum, there is eventual opacification of a linear tract extending from the superior margin of the duodenal bulb. Duodenal diverticulum is incidentally noted. IMPRESSION: Small leak from the superior margin of the duodenal bulb. Electronically Signed   By: Lorin Picket M.D.   On: 10/19/2019 11:52    Cardiac Studies  Limited echo 10/16/19 IMPRESSIONS    1. Image quality remains technically difficult, making wall motion  evaluation challenging. There is global left ventricular hypokinesis, but  there appears to be disproportionately severe inferolateral wall  hypokinesis. Left ventricular ejection  fraction, by estimation, is 40 to 45%. The left ventricle has mildly  decreased function. The left ventricle has no regional wall motion  abnormalities. Left ventricular diastolic parameters are consistent with  Grade I diastolic dysfunction (impaired  relaxation).  2. Right ventricular systolic function is normal. The right ventricular  size  is normal.  3. The mitral valve is normal in structure. No evidence of mitral valve  regurgitation. No evidence of mitral stenosis.  4. The aortic valve is normal in structure. Aortic valve regurgitation is  not visualized. No aortic stenosis is present.  5. The inferior vena cava is normal in size with greater than 50%  respiratory variability, suggesting right atrial pressure of  3 mmHg.   Comparison(s): Compared to 10/13/2019 The left ventricular function is  unchanged. The right ventricular systolic function has improved. Image  quality remains poor.   FINDINGS  Left Ventricle: Image quality remains technically difficult, making wall  motion evaluation challenging. There is global left ventricular  hypokinesis, but there appears to be disproportionately severe  inferolateral wall hypokinesis. Left ventricular  ejection fraction, by estimation, is 40 to 45%. The left ventricle has  mildly decreased function. The left ventricle has no regional wall motion  abnormalities. The left ventricular internal cavity size was normal in  size. There is no left ventricular  hypertrophy. Normal left ventricular filling pressure.   Right Ventricle: The right ventricular size is normal. No increase in  right ventricular wall thickness. Right ventricular systolic function is  normal.   Left Atrium: Left atrial size was normal in size.   Right Atrium: Right atrial size was normal in size.   Pericardium: There is no evidence of pericardial effusion.   Mitral Valve: The mitral valve is normal in structure. Normal mobility of  the mitral valve leaflets. No evidence of mitral valve stenosis.   Tricuspid Valve: The tricuspid valve is normal in structure. Tricuspid  valve regurgitation is not demonstrated. No evidence of tricuspid  stenosis.    ECHO 10/13/19 IMPRESSIONS  1. Image quality is poor. There appears to be regional wall motion  variability, but confident wall motion analysis is  impossible, even after  contrast administration. Left ventricular ejection fraction, by  estimation, is 35 to 40%. The left ventricle has  moderately decreased function. The left ventricle demonstrates regional  wall motion abnormalities. Left ventricular diastolic parameters are  consistent with Grade I diastolic dysfunction (impaired relaxation).  2. Right ventricular systolic function is moderately reduced. The right  ventricular size is mildly enlarged. Tricuspid regurgitation signal is  inadequate for assessing PA pressure.  3. The mitral valve is normal in structure. No evidence of mitral valve  regurgitation.  4. The aortic valve was not well visualized. Aortic valve regurgitation  is not visualized. No aortic stenosis is present.   Comparison(s): No prior Echocardiogram.   FINDINGS  Left Ventricle: Image quality is poor. There appears to be regional wall  motion variability, but confident wall motion analysis is impossible, even  after contrast administration. Left ventricular ejection fraction, by  estimation, is 35 to 40%. The left  ventricle has moderately decreased function. The left ventricle  demonstrates regional wall motion abnormalities. Definity contrast agent  was given IV to delineate the left ventricular endocardial borders. The  left ventricular internal cavity size was  normal in size. There is no left ventricular hypertrophy. Left ventricular  diastolic parameters are consistent with Grade I diastolic dysfunction  (impaired relaxation). Normal left ventricular filling pressure.   Right Ventricle: The right ventricular size is mildly enlarged. No  increase in right ventricular wall thickness. Right ventricular systolic  function is moderately reduced. Tricuspid regurgitation signal is  inadequate for assessing PA pressure.   Left Atrium: Left atrial size was normal in size.   Right Atrium: Right atrial size was normal in size.   Pericardium: There is  no evidence of pericardial effusion.   Mitral Valve: The mitral valve is normal in structure. No evidence of  mitral valve regurgitation.   Tricuspid Valve: The tricuspid valve is not well visualized. Tricuspid  valve regurgitation is not demonstrated.   Aortic Valve: The aortic valve was not well visualized. Aortic valve  regurgitation is not  visualized. No aortic stenosis is present.   Pulmonic Valve: The pulmonic valve was not well visualized. Pulmonic valve  regurgitation is not visualized.   Aorta: The aortic root is normal in size and structure.   IAS/Shunts: The interatrial septum was not assessed.   Patient Profile     86 y.o. male with a hx of CAD with PTCA 2000 to RCA, DM, HTN, HLDnow admitted with perforated duodenal ulcer and peritonitis, with cardiology consultation for NSVT and abnormal EKG. Echocardiogram demonstrates newly reduced ejection fraction 35 to 40%, and EKG with precordial T wave inversions.  Respiratory decompensation after extubation with hypertension and with elevated troponin 10/15/19, concerning for demand ischemia.  Assessment & Plan    1. NSTEMI see #2 2. Acute systolic heart failure 3. CAD with hx of PTCA to RCA in year 2000, by report. 10/15/19 evening his troponins rose in the setting of respiratory decompensation requiring BiPAP.  (he had been extubated earlier in the day) He continued to have difficulty clearing secretions, and demonstrated increased work of breathing.  Elevation in troponins and intermittent left bundle branch block 10/15/19 evening could still represent demand ischemia and a stress cardiomyopathic process in the setting of respiratory distress and resolving sepsis, as well as postoperative status.  However he has a history of coronary artery disease and it would be prudent to exclude obstructive CAD.  He was given rectal aspirin and started on IV heparin 10/15/19.  Ideally more time for postoperative healing would be optimal, and over  the weekend it was felt pt now yet ready for procedure.   Troponin is trending down. Troponin 659>>[id number]>>871>>776.  Throughout this hospital stay he has denied chest pain.  Chest x-ray showed bilateral pleural effusions and vascular congestion, may be secondary to hypertensive episode.  He has been given Lasix by the primary service to improve respiratory status as well as overall net positive volume status (Net 5.5 L positive since admission).  Echocardiogram was obtained today for limited assessment of LV and RV function, essentially unchanged though image quality is challenging.  Recommend initiation of heart failure therapy when able.  Tentatively plan for coronary angiography no date as of yet..  Continue IV heparin in the absence of bleeding. Need to discuss DAPT with surgical team prior to potential PCI.  Plan on this past Friday- thought to transfer to [facility] - then cardiac cath.  Timing has yet to be decided.   3.  Hypertension-BP has been significantly elevated which may be related to medical stressors and respiratory distress.  It has been challenging to give home medications due to NG to suction without opportunity for clamping.  IV beta-blocker, hydralazine have been given, blood pressure is mildly improved today but remains in the systolics 614E.  Recommend initiation of heart failure therapy for the benefit of systolic dysfunction as well as hypertension when able.   --on clonidine patch 0.2 mg may need to increase, lopressor 5 mg every 4 hours NTG paste 1 incch every 6 hours BP still elevated 157/64   4. NSVT in acute setting with stable K+ and Mg+. Now on amiodarone and improved - continued amiodarone and then stopped 10/18/19 no further VT And concern for respiratory status.   5. Sepsis due to peritonitis with hypotension, now off of vasopressors  6. Acute respiratory failure -as above.  Extubated 10/15/2019.  Required BiPAP 10/15/19 for increased work of breathing. But with BiPAP +  nausea 7. Acute ABD on admit  with perforated duodenal ulcer and emergent  exploratory lap and s/p omental patch repair.on ABX per surgery and CCM --today UGI IMPRESSION: Small leak from the superior margin of the duodenal bulb. Family not yet aware nor pt.   8.  Acute DVT -6/19 venous doppler LUE >> acute deep vein thrombosis involving the left axillary vein, surrounding the PICC line. Findings consistent with acute superficial vein thrombosis involving the left cephalic vein         For questions or updates, please contact [place] Please consult [email or web address] for contact info under        Signed, Cecilie Kicks, NP  10/19/2019, 3:57 PM

## 2019-10-19 NOTE — Progress Notes (Signed)
Nursing assistant reported that patient's BM at this time was liquid and bloody. Patient c/o abdominal pain. Will give medication for pain as soon as can per orders. Notified e-link RN to ensure full communication.

## 2019-10-19 NOTE — Progress Notes (Signed)
ANTICOAGULATION CONSULT NOTE - Follow Up Consult  Pharmacy Consult for Heparin Indication: chest pain/ACS/  Allergies  Allergen Reactions  . Losartan Potassium Other (See Comments)    Unknown reaction that happened 15 years ago    Patient Measurements: Height: 5\' 11"  (180.3 cm) (per daughter) Weight: 96.4 kg (212 lb 8.4 oz) IBW/kg (Calculated) : 75.3 Heparin Dosing Weight:   Vital Signs: Temp: 99.2 F (37.3 C) (06/21 0400) Temp Source: Axillary (06/21 0400) BP: 148/60 (06/21 0500) Pulse Rate: 73 (06/21 0500)  Labs: Recent Labs    10/16/19 1713 10/17/19 0332 10/17/19 0332 10/17/19 0741 10/18/19 0445 10/19/19 0248  HGB  --  10.1*   < >  --  10.5* 9.7*  HCT  --  29.9*  --   --  31.7* 29.3*  PLT  --  187  --   --  201 227  HEPARINUNFRC   < >  --   --  0.46 0.41 >2.20*  CREATININE  --  0.81  --   --  0.73 0.82   < > = values in this interval not displayed.    Estimated Creatinine Clearance: 79.4 mL/min (by C-G formula based on SCr of 0.82 mg/dL).   Medications:  Infusions:  . sodium chloride    . sodium chloride 10 mL/hr at 10/17/19 0218  . heparin 1,150 Units/hr (10/19/19 0200)  . piperacillin-tazobactam (ZOSYN)  IV 3.375 g (10/19/19 0434)  . TPN ADULT (ION) 95 mL/hr at 10/19/19 0200    Assessment: Patient with >2.2 heparin level.  RN called to alert RPh re: heparin level.  RN reports drawing level from 2nd lumen of central port.  Heparin is running in other lumen.  RN also reports patient with bloody stool.  Hgb also decrease < 1 gm as noted above.  Patient's heparin rate and prior levels have been very stable at goal.   All the data does not appear to correlate with high level.  Goal of Therapy:  Heparin level 0.3-0.7 units/ml Monitor platelets by anticoagulation protocol: Yes   Plan:  Redraw heparin level with peripheral stick. Continue heparin drip at current rate RN to decide if to call MD re: bloody stool  Nani Skillern Crowford 10/19/2019,6:12  AM

## 2019-10-20 ENCOUNTER — Encounter (HOSPITAL_COMMUNITY): Payer: Self-pay | Admitting: Radiology

## 2019-10-20 ENCOUNTER — Inpatient Hospital Stay (HOSPITAL_COMMUNITY): Payer: Medicare Other

## 2019-10-20 DIAGNOSIS — E7800 Pure hypercholesterolemia, unspecified: Secondary | ICD-10-CM

## 2019-10-20 LAB — CBC WITH DIFFERENTIAL/PLATELET
Abs Immature Granulocytes: 0.55 10*3/uL — ABNORMAL HIGH (ref 0.00–0.07)
Basophils Absolute: 0.1 10*3/uL (ref 0.0–0.1)
Basophils Relative: 0 %
Eosinophils Absolute: 0.3 10*3/uL (ref 0.0–0.5)
Eosinophils Relative: 2 %
HCT: 28.6 % — ABNORMAL LOW (ref 39.0–52.0)
Hemoglobin: 9.3 g/dL — ABNORMAL LOW (ref 13.0–17.0)
Immature Granulocytes: 3 %
Lymphocytes Relative: 7 %
Lymphs Abs: 1.4 10*3/uL (ref 0.7–4.0)
MCH: 26.2 pg (ref 26.0–34.0)
MCHC: 32.5 g/dL (ref 30.0–36.0)
MCV: 80.6 fL (ref 80.0–100.0)
Monocytes Absolute: 0.9 10*3/uL (ref 0.1–1.0)
Monocytes Relative: 4 %
Neutro Abs: 17.4 10*3/uL — ABNORMAL HIGH (ref 1.7–7.7)
Neutrophils Relative %: 84 %
Platelets: 257 10*3/uL (ref 150–400)
RBC: 3.55 MIL/uL — ABNORMAL LOW (ref 4.22–5.81)
RDW: 17.1 % — ABNORMAL HIGH (ref 11.5–15.5)
WBC: 20.6 10*3/uL — ABNORMAL HIGH (ref 4.0–10.5)
nRBC: 0 % (ref 0.0–0.2)

## 2019-10-20 LAB — COMPREHENSIVE METABOLIC PANEL
ALT: 39 U/L (ref 0–44)
AST: 25 U/L (ref 15–41)
Albumin: 1.8 g/dL — ABNORMAL LOW (ref 3.5–5.0)
Alkaline Phosphatase: 71 U/L (ref 38–126)
Anion gap: 8 (ref 5–15)
BUN: 33 mg/dL — ABNORMAL HIGH (ref 8–23)
CO2: 24 mmol/L (ref 22–32)
Calcium: 7.7 mg/dL — ABNORMAL LOW (ref 8.9–10.3)
Chloride: 106 mmol/L (ref 98–111)
Creatinine, Ser: 0.79 mg/dL (ref 0.61–1.24)
GFR calc Af Amer: >60 mL/min (ref >60)
GFR calc non Af Amer: >60 mL/min (ref >60)
Glucose, Bld: 165 mg/dL — ABNORMAL HIGH (ref 70–99)
Potassium: 4.3 mmol/L (ref 3.5–5.1)
Sodium: 138 mmol/L (ref 135–145)
Total Bilirubin: 0.9 mg/dL (ref 0.3–1.2)
Total Protein: 5.6 g/dL — ABNORMAL LOW (ref 6.5–8.1)

## 2019-10-20 LAB — GLUCOSE, CAPILLARY
Glucose-Capillary: 127 mg/dL — ABNORMAL HIGH (ref 70–99)
Glucose-Capillary: 132 mg/dL — ABNORMAL HIGH (ref 70–99)
Glucose-Capillary: 141 mg/dL — ABNORMAL HIGH (ref 70–99)
Glucose-Capillary: 143 mg/dL — ABNORMAL HIGH (ref 70–99)
Glucose-Capillary: 146 mg/dL — ABNORMAL HIGH (ref 70–99)
Glucose-Capillary: 149 mg/dL — ABNORMAL HIGH (ref 70–99)
Glucose-Capillary: 151 mg/dL — ABNORMAL HIGH (ref 70–99)

## 2019-10-20 LAB — HEPARIN LEVEL (UNFRACTIONATED): Heparin Unfractionated: 0.35 IU/mL (ref 0.30–0.70)

## 2019-10-20 LAB — MAGNESIUM: Magnesium: 2.2 mg/dL (ref 1.7–2.4)

## 2019-10-20 LAB — PHOSPHORUS: Phosphorus: 2.9 mg/dL (ref 2.5–4.6)

## 2019-10-20 MED ORDER — TRAVASOL 10 % IV SOLN
INTRAVENOUS | Status: AC
  Filled 2019-10-20: qty 1208.4

## 2019-10-20 MED ORDER — BISACODYL 10 MG RE SUPP
10.0000 mg | Freq: Once | RECTAL | Status: AC
  Administered 2019-10-20: 10 mg via RECTAL
  Filled 2019-10-20: qty 1

## 2019-10-20 MED ORDER — LIDOCAINE 5 % EX PTCH
1.0000 | MEDICATED_PATCH | CUTANEOUS | Status: DC
  Administered 2019-10-20 – 2019-11-05 (×17): 1 via TRANSDERMAL
  Filled 2019-10-20 (×18): qty 1

## 2019-10-20 MED ORDER — LIDOCAINE 5 % EX PTCH
1.0000 | MEDICATED_PATCH | CUTANEOUS | Status: DC
  Administered 2019-10-20 – 2019-10-23 (×4): 1 via TRANSDERMAL
  Filled 2019-10-20 (×5): qty 1

## 2019-10-20 MED ORDER — IOHEXOL 300 MG/ML  SOLN
100.0000 mL | Freq: Once | INTRAMUSCULAR | Status: AC | PRN
  Administered 2019-10-21: 100 mL via INTRAVENOUS

## 2019-10-20 NOTE — Progress Notes (Signed)
I have discussed his case with our radiologist Dr. Kathrynn Humble - he had scout exam performed in preparation for CT and this showed dense contrast in the right colon - concerned this will be near/around hepatic flexure and significantly obscure views making this a very limited study. Given his clinical status, will plan to hold off on CT today and give dulcolax suppository to help move things along, plans for higher fidelity CT tomorrow with contrast to further evaluate and ensure everything is well drained  Nadeen Landau, M.D. [facility] Use [email or web address] to contact on call provider

## 2019-10-20 NOTE — Progress Notes (Signed)
ANTICOAGULATION CONSULT NOTE - Follow Up Consult  Pharmacy Consult for Heparin Indication: chest pain/ACS  Allergies  Allergen Reactions  . Losartan Potassium Other (See Comments)    Unknown reaction that happened 15 years ago    Patient Measurements: Height: 5\' 11"  (180.3 cm) (per daughter) Weight: 96.4 kg (212 lb 8.4 oz) IBW/kg (Calculated) : 75.3 Heparin Dosing Weight:   Vital Signs: Temp: 96.6 F (35.9 C) (06/22 0400) Temp Source: Axillary (06/22 0400) BP: 145/58 (06/22 0500) Pulse Rate: 77 (06/22 0500)  Labs: Recent Labs    10/18/19 0445 10/18/19 0445 10/19/19 0248 10/19/19 0650 10/20/19 0547 10/20/19 0623  HGB 10.5*   < > 9.7*  --  9.3*  --   HCT 31.7*  --  29.3*  --  28.6*  --   PLT 201  --  227  --  257  --   HEPARINUNFRC 0.41   < > >2.20* 0.39  --  0.35  CREATININE 0.73  --  0.82  --  0.79  --    < > = values in this interval not displayed.    Estimated Creatinine Clearance: 81.4 mL/min (by C-G formula based on SCr of 0.79 mg/dL).   Medications:  Infusions:  . sodium chloride    . sodium chloride 250 mL (10/20/19 0503)  . heparin 1,150 Units/hr (10/20/19 0100)  . piperacillin-tazobactam (ZOSYN)  IV 3.375 g (10/20/19 0503)  . TPN ADULT (ION) 95 mL/hr at 10/20/19 0100    Assessment: 86 yo male with perforated duodenal ulcer s/p omental patch repair.  Pharmacy consulted to dose IV heparin for NSTEMI as well as for acute deep vein thrombosis involving the left axillary vein, surrounding the PIC line. Findings consistent with acute superficial vein thrombosis involving the left cephalic vein.  Today, 10/20/2019   Heparin level therapeutic 0.35 on 1150 units/hr  Hgb 9.3, Plts wnl  Incisional and rectal bleeding noted yesterday, none further noted since  Goal of Therapy:  Heparin level 0.3-0.7 units/ml Monitor platelets by anticoagulation protocol: Yes   Plan:  Continue heparin drip at current rate 1150 units/hr Daily heparin level and  CBC Continue to monitor for signs/symptoms of bleeding  Peggyann Juba, PharmD, BCPS Pharmacy: [phone] 10/20/2019,7:10 AM

## 2019-10-20 NOTE — Progress Notes (Signed)
8 Days Post-Op    CC: Abdominal pain  Subjective: Feeling the same as yesterday. Denies n/v. Denies abdominal pain.   Objective: Vital signs in last 24 hours: Temp:  [96.6 F (35.9 C)-98.7 F (37.1 C)] 97.7 F (36.5 C) (06/22 0800) Pulse Rate:  [75-86] 84 (06/22 0800) Resp:  [18-32] 23 (06/22 0800) BP: (127-169)/(48-83) 127/58 (06/22 0800) SpO2:  [98 %-100 %] 100 % (06/22 0800) Last BM Date: 10/19/19  Intake/Output from previous day: 06/21 0701 - 06/22 0700 In: 3985.9 [I.V.:3812.3; IV Piggyback:173.7] Out: 6226 [Urine:3200; Emesis/NG output:850; Drains:25] Intake/Output this shift: No intake/output data recorded.  General appearance: alert, cooperative and in no distress but chronically ill appearing Resp: coarse crackles bilaterally GI: Soft, nondistended, not significantly tender. JP with ss fluid in bulb Extremities: extremities normal, atraumatic, no cyanosis or edema  Lab Results:  Recent Labs    10/19/19 0248 10/20/19 0547  WBC 16.7* 20.6*  HGB 9.7* 9.3*  HCT 29.3* 28.6*  PLT 227 257    BMET Recent Labs    10/19/19 0248 10/20/19 0547  NA 139 138  K 4.0 4.3  CL 103 106  CO2 27 24  GLUCOSE 161* 165*  BUN 34* 33*  CREATININE 0.82 0.79  CALCIUM 7.8* 7.7*   PT/INR No results for input(s): LABPROT, INR in the last 72 hours.  Recent Labs  Lab 10/15/19 0339 10/16/19 0231 10/17/19 0332 10/19/19 0248 10/20/19 0547  AST 28 33 29 25 25   ALT 18 31 35 38 39  ALKPHOS 43 71 79 73 71  BILITOT 0.9 0.9 1.0 0.9 0.9  PROT 4.8* 5.3* 5.4* 5.6* 5.6*  ALBUMIN 2.0* 2.0* 2.0* 2.0* 1.8*     Lipase     Component Value Date/Time   LIPASE 44 10/12/2019 0320     Medications: . Chlorhexidine Gluconate Cloth  6 each Topical Daily  . cloNIDine  0.2 mg Transdermal Weekly  . insulin aspart  0-20 Units Subcutaneous Q4H  . insulin detemir  10 Units Subcutaneous BID  . mouth rinse  15 mL Mouth Rinse BID  . metoprolol tartrate  5 mg Intravenous Q4H  .  nitroGLYCERIN  1 inch Topical Q6H  . pantoprazole (PROTONIX) IV  40 mg Intravenous Q12H  . sodium chloride flush  10-40 mL Intracatheter Q12H   . sodium chloride    . sodium chloride Stopped (10/20/19 0503)  . heparin 1,150 Units/hr (10/20/19 0700)  . piperacillin-tazobactam (ZOSYN)  IV 12.5 mL/hr at 10/20/19 0700  . TPN ADULT (ION) 95 mL/hr at 10/20/19 0700  . TPN ADULT (ION)      Assessment/Plan Remote hx of prostate cancer s/p radiation HTN - off pressors HLD GERD - Protonix DM2 VT  - on amio drip Hyperglycemia - SSI Hypokalemia   Severe Protein calorie malnutrition - Pre-alb < 5. TPN  Perforated Duodenal Ulcer S/p Exploratory laparotomy, omental patch repair of duodenal ulcer - Dr. [name] - 10/12/2019 - POD #8 - Maintain JP drain - TPN - Continue NGT today - UGI shows small leak from repair no bile from JP at this point. Will obtain CT A/P today to further evaluate and see if any 'drainable' collection - We will follow along closely. Appreciate CCM assistance  FEN -NPO, NGT, IVF, TPN VTE - SCDs ID -Zosyn 6/14 >> day 8 Foley - per CCM Follow-Up - as per primary  Plan: CT A/P today - continue NPO status, TPN     LOS: 8 days   Nadeen Landau, M.D. [facility]  Use [email or web address] to contact on call provider

## 2019-10-20 NOTE — Progress Notes (Addendum)
Progress Note  Patient Name: Matthew Schwartz Date of Encounter: 10/20/2019  [facility] Cardiologist: Candee Furbish, MD   Subjective   Denies chest pain.  Feeling short of breath.  He is wondering about the next steps in his care.   Inpatient Medications    Scheduled Meds: . Chlorhexidine Gluconate Cloth  6 each Topical Daily  . cloNIDine  0.2 mg Transdermal Weekly  . insulin aspart  0-20 Units Subcutaneous Q4H  . insulin detemir  10 Units Subcutaneous BID  . lidocaine  1 patch Transdermal Q24H  . mouth rinse  15 mL Mouth Rinse BID  . metoprolol tartrate  5 mg Intravenous Q4H  . nitroGLYCERIN  1 inch Topical Q6H  . pantoprazole (PROTONIX) IV  40 mg Intravenous Q12H  . sodium chloride flush  10-40 mL Intracatheter Q12H   Continuous Infusions: . sodium chloride    . sodium chloride 10 mL/hr at 10/20/19 0919  . heparin 1,150 Units/hr (10/20/19 0919)  . piperacillin-tazobactam (ZOSYN)  IV Stopped (10/20/19 0906)  . TPN ADULT (ION) 95 mL/hr at 10/20/19 0919  . TPN ADULT (ION)     PRN Meds: sodium chloride, acetaminophen **OR** acetaminophen, fentaNYL (SUBLIMAZE) injection, hydrALAZINE, lip balm, ondansetron (ZOFRAN) IV, sodium chloride flush   Vital Signs    Vitals:   10/20/19 0800 10/20/19 0900 10/20/19 1000 10/20/19 1100  BP: (!) 127/58 (!) 146/65 (!) 166/67 (!) 152/65  Pulse: 84 76 81 84  Resp: (!) 23 (!) 32 (!) 23 (!) 28  Temp: 97.7 F (36.5 C)     TempSrc: Oral     SpO2: 100% 100% 100% 100%  Weight:      Height:        Intake/Output Summary (Last 24 hours) at 10/20/2019 1140 Last data filed at 10/20/2019 0919 Gross per 24 hour  Intake 3409.11 ml  Output 4050 ml  Net -640.89 ml   Last 3 Weights 10/13/2019 10/09/2019 07/22/2019  Weight (lbs) 212 lb 8.4 oz 200 lb 206 lb  Weight (kg) 96.4 kg 90.719 kg 93.441 kg      Telemetry    Sinus rhythm.  Multifocal PVCs - Personally Reviewed  ECG    n/a - Personally Reviewed  Physical Exam   VS:  BP (!) 152/65    Pulse 84   Temp 97.7 F (36.5 C) (Oral)   Resp (!) 28   Ht 5\' 11"  (1.803 m) Comment: per daughter  Wt 96.4 kg   SpO2 100%   BMI 29.64 kg/m  , BMI Body mass index is 29.64 kg/m. GENERAL: Critically ill-appearing.  Weak HEENT: Pupils equal round and reactive, fundi not visualized, oral mucosa unremarkable.  NG tube in place NECK:  No jugular venous distention, waveform within normal limits, carotid upstroke brisk and symmetric, no bruits LUNGS: Diffuse rhonchi anteriorly.  No crackles or wheezes. HEART:  RRR.  PMI not displaced or sustained,S1 and S2 within normal limits, no S3, no S4, no clicks, no rubs, no murmurs ABD:  positive bowel sounds normal in frequency in pitch, no bruits EXT:  2 plus pulses throughout, no edema, no cyanosis no clubbing SKIN:  No rashes no nodules NEURO:  Cranial nerves II through XII grossly intact, motor grossly intact throughout PSYCH:  Cognitively intact, oriented to person place and time   Labs    High Sensitivity Troponin:   Recent Labs  Lab 10/13/19 1014 10/15/19 2124 10/15/19 2314 10/16/19 0237 10/16/19 0500  TROPONINIHS 55* 659* 1,022* 871* 776*  Chemistry Recent Labs  Lab 10/17/19 0332 10/17/19 0332 10/18/19 0445 10/19/19 0248 10/20/19 0547  NA 134*   < > 139 139 138  K 3.1*   < > 4.0 4.0 4.3  CL 93*   < > 99 103 106  CO2 32   < > 28 27 24   GLUCOSE 332*   < > 206* 161* 165*  BUN 33*   < > 33* 34* 33*  CREATININE 0.81   < > 0.73 0.82 0.79  CALCIUM 7.6*   < > 8.4* 7.8* 7.7*  PROT 5.4*  --   --  5.6* 5.6*  ALBUMIN 2.0*  --   --  2.0* 1.8*  AST 29  --   --  25 25  ALT 35  --   --  38 39  ALKPHOS 79  --   --  73 71  BILITOT 1.0  --   --  0.9 0.9  GFRNONAA >60   < > >60 >60 >60  GFRAA >60   < > >60 >60 >60  ANIONGAP 9   < > 12 9 8    < > = values in this interval not displayed.     Hematology Recent Labs  Lab 10/18/19 0445 10/19/19 0248 10/20/19 0547  WBC 15.0* 16.7* 20.6*  RBC 4.07* 3.77* 3.55*  HGB 10.5* 9.7*  9.3*  HCT 31.7* 29.3* 28.6*  MCV 77.9* 77.7* 80.6  MCH 25.8* 25.7* 26.2  MCHC 33.1 33.1 32.5  RDW 16.3* 16.6* 17.1*  PLT 201 227 257    BNPNo results for input(s): BNP, PROBNP in the last 168 hours.   DDimer No results for input(s): DDIMER in the last 168 hours.   Radiology    DG CHEST PORT 1 VIEW  Result Date: 10/20/2019 CLINICAL DATA:  Respiratory failure, acute respiratory failure with hypoxia, history diabetes mellitus, hypertension, prostate cancer EXAM: PORTABLE CHEST 1 VIEW COMPARISON:  Portable exam 0457 hours compared to 10/19/2019 FINDINGS: Nasogastric tube extends into stomach. LEFT arm PICC line tip projects over RIGHT atrium. Borderline enlargement of cardiac silhouette. Mediastinal contours and pulmonary vascularity normal. Bibasilar atelectasis greater on RIGHT. Remaining lungs clear. No pleural effusion or pneumothorax. Bones demineralized. IMPRESSION: Bibasilar atelectasis greater on RIGHT. Electronically Signed   By: Lavonia Dana M.D.   On: 10/20/2019 09:08   DG Chest Port 1 View  Result Date: 10/19/2019 CLINICAL DATA:  Acute respiratory failure with hypoxemia. EXAM: PORTABLE CHEST 1 VIEW COMPARISON:  One-view chest x-ray 10/17/2019 FINDINGS: Heart is mildly enlarged. This is exaggerated by low lung volumes. Bibasilar airspace opacities are present. Effusions are noted. Aeration is slightly improved. NG tube courses off the inferior border of the film. Left-sided PICC line is stable. IMPRESSION: Slight improvement in aeration with persistent bibasilar airspace disease and effusions. Electronically Signed   By: San Morelle M.D.   On: 10/19/2019 08:12   DG UGI W SINGLE CM (SOL OR THIN BA)  Result Date: 10/19/2019 CLINICAL DATA:  Bleeding duodenal ulcer repair 5 days ago. Evaluate for leak. EXAM: WATER SOLUBLE UPPER GI SERIES TECHNIQUE: Single-column upper GI series was performed using water soluble contrast. CONTRAST:  300 cc Omnipaque 300. COMPARISON:  None.  FLUOROSCOPY TIME:  Fluoroscopy Time:  3 minutes 6 seconds Radiation Exposure Index (if provided by the fluoroscopic device): 87 mGy Number of Acquired Spot Images: 13 FINDINGS: Scout view of the abdomen shows a nasogastric tube terminating in the stomach. Surgical drain terminates in the left upper quadrant. Stool is seen in the  colon. Bibasilar volume loss in the lung bases. 300 cc Omnipaque 300 were hand injected through the patient's nasogastric tube. Stomach opacified. With filling of the duodenum, there is eventual opacification of a linear tract extending from the superior margin of the duodenal bulb. Duodenal diverticulum is incidentally noted. IMPRESSION: Small leak from the superior margin of the duodenal bulb. Electronically Signed   By: Lorin Picket M.D.   On: 10/19/2019 11:52    Cardiac Studies   Limited echo 10/16/19 IMPRESSIONS    1. Image quality remains technically difficult, making wall motion  evaluation challenging. There is global left ventricular hypokinesis, but  there appears to be disproportionately severe inferolateral wall  hypokinesis. Left ventricular ejection  fraction, by estimation, is 40 to 45%. The left ventricle has mildly  decreased function. The left ventricle has no regional wall motion  abnormalities. Left ventricular diastolic parameters are consistent with  Grade I diastolic dysfunction (impaired  relaxation).  2. Right ventricular systolic function is normal. The right ventricular  size is normal.  3. The mitral valve is normal in structure. No evidence of mitral valve  regurgitation. No evidence of mitral stenosis.  4. The aortic valve is normal in structure. Aortic valve regurgitation is  not visualized. No aortic stenosis is present.  5. The inferior vena cava is normal in size with greater than 50%  respiratory variability, suggesting right atrial pressure of 3 mmHg.   Comparison(s): Compared to 10/13/2019 The left ventricular function is   unchanged. The right ventricular systolic function has improved. Image  quality remains poor.   FINDINGS  Left Ventricle: Image quality remains technically difficult, making wall  motion evaluation challenging. There is global left ventricular  hypokinesis, but there appears to be disproportionately severe  inferolateral wall hypokinesis. Left ventricular  ejection fraction, by estimation, is 40 to 45%. The left ventricle has  mildly decreased function. The left ventricle has no regional wall motion  abnormalities. The left ventricular internal cavity size was normal in  size. There is no left ventricular  hypertrophy. Normal left ventricular filling pressure.   Right Ventricle: The right ventricular size is normal. No increase in  right ventricular wall thickness. Right ventricular systolic function is  normal.   Left Atrium: Left atrial size was normal in size.   Right Atrium: Right atrial size was normal in size.   Pericardium: There is no evidence of pericardial effusion.   Mitral Valve: The mitral valve is normal in structure. Normal mobility of  the mitral valve leaflets. No evidence of mitral valve stenosis.   Tricuspid Valve: The tricuspid valve is normal in structure. Tricuspid  valve regurgitation is not demonstrated. No evidence of tricuspid  stenosis.    ECHO 10/13/19 IMPRESSIONS  1. Image quality is poor. There appears to be regional wall motion  variability, but confident wall motion analysis is impossible, even after  contrast administration. Left ventricular ejection fraction, by  estimation, is 35 to 40%. The left ventricle has  moderately decreased function. The left ventricle demonstrates regional  wall motion abnormalities. Left ventricular diastolic parameters are  consistent with Grade I diastolic dysfunction (impaired relaxation).  2. Right ventricular systolic function is moderately reduced. The right  ventricular size is mildly enlarged.  Tricuspid regurgitation signal is  inadequate for assessing PA pressure.  3. The mitral valve is normal in structure. No evidence of mitral valve  regurgitation.  4. The aortic valve was not well visualized. Aortic valve regurgitation  is not visualized. No aortic stenosis  is present.   Comparison(s): No prior Echocardiogram.   FINDINGS  Left Ventricle: Image quality is poor. There appears to be regional wall  motion variability, but confident wall motion analysis is impossible, even  after contrast administration. Left ventricular ejection fraction, by  estimation, is 35 to 40%. The left  ventricle has moderately decreased function. The left ventricle  demonstrates regional wall motion abnormalities. Definity contrast agent  was given IV to delineate the left ventricular endocardial borders. The  left ventricular internal cavity size was  normal in size. There is no left ventricular hypertrophy. Left ventricular  diastolic parameters are consistent with Grade I diastolic dysfunction  (impaired relaxation). Normal left ventricular filling pressure.   Right Ventricle: The right ventricular size is mildly enlarged. No  increase in right ventricular wall thickness. Right ventricular systolic  function is moderately reduced. Tricuspid regurgitation signal is  inadequate for assessing PA pressure.   Left Atrium: Left atrial size was normal in size.   Right Atrium: Right atrial size was normal in size.   Pericardium: There is no evidence of pericardial effusion.   Mitral Valve: The mitral valve is normal in structure. No evidence of  mitral valve regurgitation.   Tricuspid Valve: The tricuspid valve is not well visualized. Tricuspid  valve regurgitation is not demonstrated.   Aortic Valve: The aortic valve was not well visualized. Aortic valve  regurgitation is not visualized. No aortic stenosis is present.   Pulmonic Valve: The pulmonic valve was not well visualized. Pulmonic  valve  regurgitation is not visualized.   Aorta: The aortic root is normal in size and structure.   IAS/Shunts: The interatrial septum was not assessed.   Patient Profile   Mr. Trudo is an 20M with CAD s/p RCA PCI in 2000, hypertension, hyperlipidemia, DDM admitted with a perforated duodenal ulcer and periotonitis.  Cardiology was consulted for NSVT, intermittent left bundle branch block and anterolateral T wave inversions.  High-sensitivity troponin peaked at 1022.  Cardiac catheterization delayed by hypoxic respiratory failure and abdominal surgery.  Assessment & Plan    # NSTEMI: # NSVT: In the setting of his acute illness, he had intermittent left bundle branch block and anterolateral T wave inversions.  High-sensitivity troponin peaked at 1022.  Plans for left heart catheterization once he does not need any more intra-abdominal surgeries, respiratory status has improved, and is able to take oral medications.  Fortunately, he is not having any chest pain.  Continue heparin.  # Acute systolic and diastolic heart failure: #Essential hypertension: He is euvolemic.  Planning for left heart catheterization once more clinically stable as above.  Plan to start an oral heart failure medication regimen once able to tolerate p.o.'s or medications can go an NG tube without needing continuous suction.  Blood pressure has been labile.  Continue clonidine patch, IV metoprolol, and IV hydralazine as needed.  # Acute hypoxic respiratory failure: Extubated 6/17.  He is very rhonchorous and weak.  Respiratory status remains tenuous.  Per CCM.  Atelectasis was noted on chest x-ray today.  No pulmonary edema.   Time spent: 35 minutes-Greater than 50% of this time was spent in counseling, explanation of diagnosis, planning of further management, and coordination of care.   For questions or updates, please contact [place] Please consult [email or web address] for contact info under         Signed, Skeet Latch, MD  10/20/2019, 11:40 AM

## 2019-10-20 NOTE — Progress Notes (Signed)
PHARMACY - TOTAL PARENTERAL NUTRITION CONSULT NOTE   Indication: Prolonged ileus  Patient Measurements: Height: 5\' 11"  (180.3 cm) (per daughter) Weight: 96.4 kg (212 lb 8.4 oz) IBW/kg (Calculated) : 75.3   Body mass index is 29.64 kg/m. Usual Weight: 90.7 kg   Assessment:  - Pharmacy is consulted to start TPN in 86 yo male with prolonged ileus. Pt underwent exploratory laparotomy on 6/14 which revealed perforated duodenal ulcer.   Glucose / Insulin: hx of DM - on metformin PTA.  - CBGs elevated but improved after increasing insulin in TPN, on rSSI q4h (range 140-151; goal 100-150) - 19 units of SS insulin required yesterday - Pt ordered Levemir 10 units BID per CCM 6/18  - 6/19 Added 25 units of regular insulin to TPN, increased 45 units 6/20 Electrolytes: K (target > 4.0) is 4.3. Magnesium 2.2 (target > 2.0); others wnl including corrected calcium 9.46 Renal: SCr now WNL,  BUN elevated; bicarb WNL, UOP increased after receiving Lasix 6/16-6/17 LFTs / TGs: LFTs remain WNL, Tbili improved to WNL after procedure; TG are now WNL after stopping propofol. Previously elevated level likely spurious  Prealbumin / albumin: prealbumin <5 (6/16), 9.0 (6/21); albumin 1.8 Intake / Output; MIVF: +504 yesterday; on NS at [facility]; NG output 850  GI Imaging:  - 6/21 Upper GI series - small leak from superior margin of duodenal bulb. Surgeries / Procedures:  - 6/14 Exploratory laparotomy which revealed perforated duodenal ulcer.   Central access:  PICC Line 6/15 TPN start date: 6/15  Nutritional Goals:   (per RD recommendation on 6/15): Kcal:  2200-2400 Protein:  115-125 grams Fluid:  >2L/d Goal TPN rate is 95 mL/hr (provides 121 g of protein and 2262 kcals per day)  Current Nutrition:  NPO   Plan:    Continue TPN to 95 mL/hr at 1800, the target rate   Electrolytes in TPN: 80 mEq/L of Na, 80 mEq/L of K (max in TPN per [facility] policy) , 4 mEq/L of Ca, 10 mEq/L of Magnesium, and 10 mmol/L of  Phos. Cl:Ac ratio max Ac  Standard MVI and trace elements to TPN  Continue SSI resistant  q4h SSI, and monitor Levemir and adjust as needed   Continue 45 units of regular insulin to TPN   Continue MIVF to KVO mL/hr at 1800  Monitor TPN labs on Mon/Thurs;    Peggyann Juba, PharmD, BCPS Pharmacy: [phone] 10/20/2019 7:17 AM

## 2019-10-20 NOTE — Progress Notes (Signed)
Physical Therapy Treatment Patient Details Name: Matthew Schwartz MRN: [id number] DOB: 1933/02/10 Today's Date: 10/20/2019    History of Present Illness Pt s/p exploratory lap (10/12/19) to repair perforated duodenal ulcer and then on vent with extubation 10/14/19.  Pt with acute respiratory failure with hypoxia and acute systolic CHF in setting of sepsis and peritonitis.  Pt with hx of DM, prostate CA and bil TKR    PT Comments    The patient is in recliner, has slid down and unable to safely sit recliner upright. Patient di perform A/AAR)OM exercises in recliner. Multiple man slide to bed as mechanical lift is not functioning. Patient going to xray now. Continue PT/mobility.   Follow Up Recommendations  SNF;CIR     Equipment Recommendations  None recommended by PT    Recommendations for Other Services  OT     Precautions / Restrictions Precautions Precautions: Fall Precaution Comments: drain on right abd, abd incision, monitor VS    Mobility  Bed Mobility Overal bed mobility: Needs Assistance Bed Mobility: Rolling Rolling: +2 for physical assistance;Total assist;+2 for safety/equipment         General bed mobility comments: assist  with trunk, reaches for rail to roll each way  Transfers Overall transfer level: Needs assistance   Transfers: Lateral/Scoot Transfers           General transfer comment: 4 man slide from recliner to bed  Ambulation/Gait                 Stairs             Wheelchair Mobility    Modified Rankin (Stroke Patients Only)       Balance                                            Cognition Arousal/Alertness: Awake/alert Behavior During Therapy: Flat affect Overall Cognitive Status: Difficult to assess                                 General Comments: generally follows directions simple, does not speak      Exercises General Exercises - Upper Extremity Shoulder Flexion: AROM;AAROM;10  reps;Right;Left;Supine Shoulder ABduction: AROM;AAROM;10 reps;Left;Right;Supine General Exercises - Lower Extremity Ankle Circles/Pumps: AROM;Both;10 reps Heel Slides: AAROM;Both;10 reps    General Comments General comments (skin integrity, edema, etc.): NT, had slid down in recliner, too close to edge to sit upright      Pertinent Vitals/Pain Pain Assessment: Faces    Home Living                      Prior Function            PT Goals (current goals can now be found in the care plan section) Progress towards PT goals: Progressing toward goals    Frequency    Min 2X/week      PT Plan Discharge plan needs to be updated;Frequency needs to be updated    Co-evaluation              AM-PAC PT "6 Clicks" Mobility   Outcome Measure  Help needed turning from your back to your side while in a flat bed without using bedrails?: Total Help needed moving from lying on your back to sitting on the side of a flat  bed without using bedrails?: Total Help needed moving to and from a bed to a chair (including a wheelchair)?: Total Help needed standing up from a chair using your arms (e.g., wheelchair or bedside chair)?: Total Help needed to walk in hospital room?: Total Help needed climbing 3-5 steps with a railing? : Total 6 Click Score: 6    End of Session   Activity Tolerance: Patient tolerated treatment well;Patient limited by fatigue Patient left: in bed;with call bell/phone within reach;with nursing/sitter in room Nurse Communication: Mobility status;Need for lift equipment PT Visit Diagnosis: Difficulty in walking, not elsewhere classified (R26.2);Muscle weakness (generalized) (M62.81)     Time: 0263-7858 PT Time Calculation (min) (ACUTE ONLY): 20 min  Charges:  $Therapeutic Activity: 8-22 mins                     Tresa Endo PT Acute Rehabilitation Services Pager [phone] Office [phone]    Claretha Cooper 10/20/2019, 2:45 PM

## 2019-10-20 NOTE — Progress Notes (Addendum)
NAME:  Matthew Schwartz, MRN:  [id number], DOB:  1932/12/01, LOS: 8 ADMISSION DATE:  10/12/2019, CONSULTATION DATE:  10/12/2019 REFERRING MD:  Dr. [name], Surgery, CHIEF COMPLAINT:  Abdominal pain  Brief History   86 y/o male, former smoker, presented with nausea/vomiting, abdominal pain and constipation x 4 to 5 days.  Seen twice prior to current admit for the same with imaging studies unremarkable for cause.  On return 6/14, seen by GI and found to have concern for acute abdomen and taken for exploratory laparotomy.  Found to have perforated duodenal ulcer s/p omental patch repair.  Remained on vent post op.  PCCM consulted for critical care management.  Past Medical History  Prostate cancer, HTN, GERD, HLD, DM  Significant Hospital Events   6/14 Admit, exploratory laparotomy, omental patch repair of duodenal ulcer 6/16 Off vasopressors, less drainage from abd drain / clearing  6/17 extubated, weak cough, added high flow oxygen 6/18 start heparin gtt 6/20 d/c amiodarone  Consults:  Gastroenterology General surgery Cardiology  Procedures:  Rt IJ CVL 6/14 >> 6/15 Rt radial a line 6/14 >> 6/17 ETT 6/14 >> 6/17 LUE PICC 6/16 >>     Significant Diagnostic Tests:  CT angio abd/pelvis 6/13 >> mild atherosclerosis, no significant arterial stenosis of mesenteric vasculature, no findings of bowel ischemia, colonic diverticulosis w/o diverticulitis, b/l renal cysts, 12 mm lesion upper pole Rt kidney ECHO 6/15 >> poor image quality, appears to be regional wall motion variability but confident wall motion analysis is impossible even after contrast administration, LVEF ~ 13-24%, Grade I diastolic dysfunction, RV systolic function is moderately reduced, RV is mildly enlarged 6/19 venous doppler LUE >> acute deep vein thrombosis involving the left axillary vein, surrounding the PICC line. Findings consistent with acute superficial vein thrombosis involving the left cephalic vein. UGI 6/21 >> small leak  from duodenum  Micro Data:  SARS CoV2 6/13 >> negative UC 6/14 >> less than 10k insignificant growth   Antimicrobials:  Zosyn 6/14 >>  Interim history/subjective:   No acute events.  Remains n.p.o. due to small leak noted on upper GI series  Objective   Blood pressure (!) 146/65, pulse 76, temperature 97.7 F (36.5 C), temperature source Oral, resp. rate (!) 32, height 5\' 11"  (1.803 m), weight 96.4 kg, SpO2 100 %.        Intake/Output Summary (Last 24 hours) at 10/20/2019 1033 Last data filed at 10/20/2019 0919 Gross per 24 hour  Intake 3409.11 ml  Output 4050 ml  Net -640.89 ml   Filed Weights   10/13/19 0923  Weight: 96.4 kg          Examination: Gen:      No acute distress HEENT:  EOMI, sclera anicteric Neck:     No masses; no thyromegaly Lungs:    Clear to auscultation bilaterally; normal respiratory effort CV:         Regular rate and rhythm; no murmurs Abd:      + bowel sounds; soft, non-tender; no palpable masses, no distension Ext:    No edema; adequate peripheral perfusion Skin:      Warm and dry; no rash Neuro: alert and oriented x 3  Labs significant for  BUN/creatinine 33/0.79 WBC 20.6, hemoglobin 9.3, platelets 257 Chest x-ray 6/22-bibasilar atelectasis   Resolved Hospital Problem list   Septic shock  Assessment & Plan:   Peritonitis in setting of perforated duodenal ulcer s/p omental patch repair. -continue abx as above -Keep NPO.   - Leukocytosis noted.  For CT abdomen pelvis today to see if there is drainable collections -protinix BID -TPN per [facility]  Acute respiratory failure with hypoxia in setting of sepsis and peritonitis. Extubated 6/17.   -encourage pulmonary hygiene - IS, mobilize -no BiPAP with nausea  -Weaning down oxygen -if evidence of respiratory muscle fatigue, intubate  -follow intermittent CXR     NSVT Acute Systolic CHF in setting of Sepsis likely from Demand Ischemia. Hx of CAD, HTN, HLD. -heparin gtt per pharmacy,  continue lower level therapeutic  -monitor for evidence of bleeding, change in Hgb  -continue IV lopressor, NTG / clonidine patch  -SBP goal <140  -appreciate Cardiology evaluation > transfer on hold, Cards will consider LHC when he is able to lie flat without hypoxia and able to take p.o. -hold home norvasc, toprol, imdur, altace, ASA, lipitor as NPO   LUE DVT  Cephalic vein, axillary vein involvement  -continue PICC for now, treat with IV heparin gtt   DM type II poorly controlled with hyperglycemia. -SSI  -levemir 10 units BID  -hold home metformin as NPO   Anemia of critical illness. -trend CBC  -transfuse for Hgb <7% or active bleeding   Best practice:  Diet: TPN DVT prophylaxis: heparin gtt GI prophylaxis: Protonix Mobility: Bed rest / mobilize tolerated  Code Status: full code Disposition: ICU Communication: Daughter updated at bedside 6/22    Labs    CMP Latest Ref Rng & Units 10/20/2019 10/19/2019 10/18/2019  Glucose 70 - 99 mg/dL 165(H) 161(H) 206(H)  BUN 8 - 23 mg/dL 33(H) 34(H) 33(H)  Creatinine 0.61 - 1.24 mg/dL 0.79 0.82 0.73  Sodium 135 - 145 mmol/L 138 139 139  Potassium 3.5 - 5.1 mmol/L 4.3 4.0 4.0  Chloride 98 - 111 mmol/L 106 103 99  CO2 22 - 32 mmol/L 24 27 28   Calcium 8.9 - 10.3 mg/dL 7.7(L) 7.8(L) 8.4(L)  Total Protein 6.5 - 8.1 g/dL 5.6(L) 5.6(L) -  Total Bilirubin 0.3 - 1.2 mg/dL 0.9 0.9 -  Alkaline Phos 38 - 126 U/L 71 73 -  AST 15 - 41 U/L 25 25 -  ALT 0 - 44 U/L 39 38 -    CBC Latest Ref Rng & Units 10/20/2019 10/19/2019 10/18/2019  WBC 4.0 - 10.5 K/uL 20.6(H) 16.7(H) 15.0(H)  Hemoglobin 13.0 - 17.0 g/dL 9.3(L) 9.7(L) 10.5(L)  Hematocrit 39 - 52 % 28.6(L) 29.3(L) 31.7(L)  Platelets 150 - 400 K/uL 257 227 201    ABG    Component Value Date/Time   PHART 7.406 10/15/2019 2112   PCO2ART 40.7 10/15/2019 2112   PO2ART 60.7 (L) 10/15/2019 2112   HCO3 25.1 10/15/2019 2112   TCO2 22 10/12/2019 1717   ACIDBASEDEF 2.6 (H) 10/13/2019 0311    O2SAT 91.4 10/15/2019 2112    CBG (last 3)  Recent Labs    10/20/19 0052 10/20/19 0414 10/20/19 0759  GLUCAP 146* 151* 149*    The patient is critically ill with multiple organ system failure and requires high complexity decision making for assessment and support, frequent evaluation and titration of therapies, advanced monitoring, review of radiographic studies and interpretation of complex data.   Critical Care Time devoted to patient care services, exclusive of separately billable procedures, described in this note is 35 minutes.   Marshell Garfinkel MD [facility] Please see [email or web address] for pager details.  10/20/2019, 10:55 AM

## 2019-10-21 ENCOUNTER — Inpatient Hospital Stay (HOSPITAL_COMMUNITY): Payer: Medicare Other

## 2019-10-21 LAB — BASIC METABOLIC PANEL WITH GFR
Anion gap: 7 (ref 5–15)
BUN: 32 mg/dL — ABNORMAL HIGH (ref 8–23)
CO2: 25 mmol/L (ref 22–32)
Calcium: 7.9 mg/dL — ABNORMAL LOW (ref 8.9–10.3)
Chloride: 107 mmol/L (ref 98–111)
Creatinine, Ser: 0.82 mg/dL (ref 0.61–1.24)
GFR calc Af Amer: >60 mL/min
GFR calc non Af Amer: >60 mL/min
Glucose, Bld: 148 mg/dL — ABNORMAL HIGH (ref 70–99)
Potassium: 4 mmol/L (ref 3.5–5.1)
Sodium: 139 mmol/L (ref 135–145)

## 2019-10-21 LAB — CBC WITH DIFFERENTIAL/PLATELET
Abs Immature Granulocytes: 0.35 K/uL — ABNORMAL HIGH (ref 0.00–0.07)
Basophils Absolute: 0.1 K/uL (ref 0.0–0.1)
Basophils Relative: 0 %
Eosinophils Absolute: 0.3 K/uL (ref 0.0–0.5)
Eosinophils Relative: 1 %
HCT: 28.3 % — ABNORMAL LOW (ref 39.0–52.0)
Hemoglobin: 9.2 g/dL — ABNORMAL LOW (ref 13.0–17.0)
Immature Granulocytes: 2 %
Lymphocytes Relative: 8 %
Lymphs Abs: 1.3 K/uL (ref 0.7–4.0)
MCH: 26.1 pg (ref 26.0–34.0)
MCHC: 32.5 g/dL (ref 30.0–36.0)
MCV: 80.2 fL (ref 80.0–100.0)
Monocytes Absolute: 0.8 K/uL (ref 0.1–1.0)
Monocytes Relative: 4 %
Neutro Abs: 15 K/uL — ABNORMAL HIGH (ref 1.7–7.7)
Neutrophils Relative %: 85 %
Platelets: 300 K/uL (ref 150–400)
RBC: 3.53 MIL/uL — ABNORMAL LOW (ref 4.22–5.81)
RDW: 17.2 % — ABNORMAL HIGH (ref 11.5–15.5)
WBC: 17.7 K/uL — ABNORMAL HIGH (ref 4.0–10.5)
nRBC: 0 % (ref 0.0–0.2)

## 2019-10-21 LAB — GLUCOSE, CAPILLARY
Glucose-Capillary: 135 mg/dL — ABNORMAL HIGH (ref 70–99)
Glucose-Capillary: 137 mg/dL — ABNORMAL HIGH (ref 70–99)
Glucose-Capillary: 145 mg/dL — ABNORMAL HIGH (ref 70–99)
Glucose-Capillary: 145 mg/dL — ABNORMAL HIGH (ref 70–99)
Glucose-Capillary: 148 mg/dL — ABNORMAL HIGH (ref 70–99)
Glucose-Capillary: 150 mg/dL — ABNORMAL HIGH (ref 70–99)

## 2019-10-21 LAB — HEPARIN LEVEL (UNFRACTIONATED)
Heparin Unfractionated: 0.27 IU/mL — ABNORMAL LOW (ref 0.30–0.70)
Heparin Unfractionated: 0.36 IU/mL (ref 0.30–0.70)

## 2019-10-21 LAB — PHOSPHORUS: Phosphorus: 2.7 mg/dL (ref 2.5–4.6)

## 2019-10-21 LAB — MAGNESIUM: Magnesium: 2.3 mg/dL (ref 1.7–2.4)

## 2019-10-21 MED ORDER — HEPARIN (PORCINE) 25000 UT/250ML-% IV SOLN
1200.0000 [IU]/h | INTRAVENOUS | Status: DC
  Administered 2019-10-21 – 2019-10-24 (×4): 1200 [IU]/h via INTRAVENOUS
  Filled 2019-10-21 (×4): qty 250

## 2019-10-21 MED ORDER — LORAZEPAM 2 MG/ML IJ SOLN
1.0000 mg | Freq: Once | INTRAMUSCULAR | Status: AC
  Administered 2019-10-22: 1 mg via INTRAVENOUS
  Filled 2019-10-21: qty 1

## 2019-10-21 MED ORDER — IOHEXOL 300 MG/ML  SOLN
100.0000 mL | Freq: Once | INTRAMUSCULAR | Status: AC | PRN
  Administered 2019-10-21: 100 mL

## 2019-10-21 MED ORDER — TRAVASOL 10 % IV SOLN
INTRAVENOUS | Status: AC
  Filled 2019-10-21: qty 1208.4

## 2019-10-21 MED ORDER — SODIUM CHLORIDE (PF) 0.9 % IJ SOLN
INTRAMUSCULAR | Status: AC
  Administered 2019-10-21: 10 mL
  Filled 2019-10-21: qty 50

## 2019-10-21 NOTE — Progress Notes (Signed)
PHARMACY - TOTAL PARENTERAL NUTRITION CONSULT NOTE   Indication: Prolonged ileus  Patient Measurements: Height: 5\' 11"  (180.3 cm) (per daughter) Weight: 96.4 kg (212 lb 8.4 oz) IBW/kg (Calculated) : 75.3   Body mass index is 29.64 kg/m. Usual Weight: 90.7 kg   Assessment:  - Pharmacy is consulted to start TPN in 86 yo male with prolonged ileus. Pt underwent exploratory laparotomy on 6/14 which revealed perforated duodenal ulcer.   Glucose / Insulin: hx of DM - on metformin PTA.  - CBGs now at goal with insulin in TPN, on rSSI q4h (range 137-141; goal 100-150) - 19 units of SS insulin required yesterday - Levemir 10 units BID per CCM started 6/18  - 6/18 Added 25 units of regular insulin to TPN, increased 45 units 6/20 Electrolytes: K (target >/= 4.0) is 4.0. Magnesium 2.3 (target >/= 2.0); others wnl including corrected calcium 9.66 Renal: SCr now WNL,  BUN elevated; bicarb WNL, UOP increased after receiving Lasix 6/16-6/17 LFTs / TGs: LFTs remain WNL, Tbili improved to WNL after procedure; TG are now WNL after stopping propofol. Previously elevated level likely spurious  Prealbumin / albumin: prealbumin <5 (6/16), 9.0 (6/21); albumin 1.8 Intake / Output; MIVF: -383 yesterday; on NS at [facility]; NG output 125  GI Imaging:  - 6/21 Upper GI series - small leak from superior margin of duodenal bulb. Surgeries / Procedures:  - 6/14 Exploratory laparotomy which revealed perforated duodenal ulcer.   Central access:  PICC Line 6/15 TPN start date: 6/15  Nutritional Goals:   (per RD recommendation on 6/15): Kcal:  2200-2400 Protein:  115-125 grams Fluid:  >2L/d Goal TPN rate is 95 mL/hr (provides 121 g of protein and 2262 kcals per day)  Current Nutrition:  NPO   Plan:    Continue TPN to 95 mL/hr at 1800, the target rate   Electrolytes in TPN: 80 mEq/L of Na, 80 mEq/L of K (max in TPN per [facility] policy) , 4 mEq/L of Ca, 10 mEq/L of Magnesium, and 10 mmol/L of Phos. Cl:Ac ratio max  Ac  Standard MVI and trace elements to TPN  Continue SSI resistant  q4h SSI, and monitor Levemir and adjust as needed   Continue 45 units of regular insulin to TPN   Continue MIVF to KVO mL/hr at 1800  Monitor TPN labs on Mon/Thurs;    Peggyann Juba, PharmD, BCPS Pharmacy: [phone] 10/21/2019 7:39 AM

## 2019-10-21 NOTE — Progress Notes (Signed)
Patient is s/p exploratory laparotomy with omental patch repair of perforated duodenal ulcer with Dr. [name] on 10/12/19. CT abd/pelvis w/contrast was obtained today due to persistent small leak on postoperative upper GI series - findings notable for nonspecific 4.3 cm air-fluid collection in the RUQ between the duodenal bulb and gallbladder, not containing enteric contents without residual leak from the duodenal bulb identified. IR has been asked to evaluate for aspiration/possible drain placement - patient history and imaging reviewed by Dr. Anselm Pancoast today who approves procedure.  Presented to patient's room this afternoon to discuss procedure - patient's daughter present during discussion. Matthew Schwartz tells me that he does not want to have any more procedures at this time as he feels his body cannot take anymore right now. He and his daughter wish to hold off on any decision until they have spoken with general surgery tomorrow. They request that the interventional radiologist discuss the procedure with them tomorrow after they are seen by general surgery.  Will have IR MD follow up with patient tomorrow to discuss possible procedure after patient has been seen by [facility] per their request.   This information was relayed to Will Creig Hines, PA-C today by this Probation officer.  Please call with any questions or concerns.  Candiss Norse, PA-C

## 2019-10-21 NOTE — Progress Notes (Signed)
NAME:  Matthew Schwartz, MRN:  [id number], DOB:  02/03/1933, LOS: 9 ADMISSION DATE:  10/12/2019, CONSULTATION DATE:  10/12/2019 REFERRING MD:  Dr. [name], Surgery, CHIEF COMPLAINT:  Abdominal pain  Brief History   86 y/o male, former smoker, presented with nausea/vomiting, abdominal pain and constipation x 4 to 5 days.  Seen twice prior to current admit for the same with imaging studies unremarkable for cause.  On return 6/14, seen by GI and found to have concern for acute abdomen and taken for exploratory laparotomy.  Found to have perforated duodenal ulcer s/p omental patch repair.  Remained on vent post op.  PCCM consulted for critical care management.  Past Medical History  Prostate cancer, HTN, GERD, HLD, DM  Significant Hospital Events   6/14 Admit, exploratory laparotomy, omental patch repair of duodenal ulcer 6/16 Off vasopressors, less drainage from abd drain / clearing  6/17 extubated, weak cough, added high flow oxygen 6/18 start heparin gtt 6/20 d/c amiodarone  Consults:  Gastroenterology General surgery Cardiology  Procedures:  Rt IJ CVL 6/14 >> 6/15 Rt radial a line 6/14 >> 6/17 ETT 6/14 >> 6/17 LUE PICC 6/16 >>     Significant Diagnostic Tests:  CT angio abd/pelvis 6/13 >> mild atherosclerosis, no significant arterial stenosis of mesenteric vasculature, no findings of bowel ischemia, colonic diverticulosis w/o diverticulitis, b/l renal cysts, 12 mm lesion upper pole Rt kidney ECHO 6/15 >> poor image quality, appears to be regional wall motion variability but confident wall motion analysis is impossible even after contrast administration, LVEF ~ 53-61%, Grade I diastolic dysfunction, RV systolic function is moderately reduced, RV is mildly enlarged 6/19 venous doppler LUE >> acute deep vein thrombosis involving the left axillary vein, surrounding the PICC line. Findings consistent with acute superficial vein thrombosis involving the left cephalic vein. UGI 6/21 >> small leak  from duodenum CT ABD/Pelvis 6/23 >>   Micro Data:  SARS CoV2 6/13 >> negative UC 6/14 >> less than 10k insignificant growth   Antimicrobials:  Zosyn 6/14 >>  Interim history/subjective:  On room air  Glucose range 137-148 Tmax 99.2 / WBC 17.7 (from 20.6) I/O 2.3L UOP, +210 ml in last 24 hours   Objective   Blood pressure (!) 141/62, pulse 80, temperature 99.2 F (37.3 C), temperature source Axillary, resp. rate (!) 23, height 5\' 11"  (1.803 m), weight 96.4 kg, SpO2 100 %.        Intake/Output Summary (Last 24 hours) at 10/21/2019 1100 Last data filed at 10/21/2019 0800 Gross per 24 hour  Intake 2386.16 ml  Output 3001 ml  Net -614.84 ml   Filed Weights   10/13/19 0923  Weight: 96.4 kg          Examination: General: elderly male sitting up in bed in NAD   HEENT: MM pink/moist, NGT in place Neuro: AAOx4, speech clear, MAE  CV: s1s2 rrr, no m/r/g PULM: non-labored on RA, lungs clear, stronger cough  GI: soft, JP drain in place, hypoactive bowel sounds, midline dressing intact Extremities: warm/dry, trace edema  Skin: no rashes or lesions   Resolved Hospital Problem list   Septic shock  Assessment & Plan:   Peritonitis in setting of perforated duodenal ulcer s/p omental patch repair. -continue zosyn, D10/x  -NPO  -follow leukocytosis  -await CT abd/pelvis 6/23, contrast dye must clear before able to image as will obscure images -PPI BID  -TPN per [facility]  Acute respiratory failure with hypoxia in setting of sepsis and peritonitis. Extubated 6/17.   -  pulmonary hygiene - mobilize, incentive spirometry  -if evidence of respiratory muscle fatigue, intubate -follow intermittent CXR     NSVT Acute Systolic CHF in setting of Sepsis likely from Demand Ischemia. Hx of CAD, HTN, HLD. -heparin gtt per pharmacy, continue low limit therapeutic  -monitor for bleeding -SBP goal <140  -appreciate Cardiology evaluation > plan for LHC once pt recovered / able to lie flat  without dyspnea / hypoxia  -continue clonidine, IV lopressor, NTG paste -hold home PO regimen >norvasc, toprol, imdur, altace, ASA, lipitor   LUE DVT  Cephalic vein, axillary vein involvement  -pt must have PICC for TPN, treat with IV heparin   DM type II poorly controlled with hyperglycemia. -SSI  -continue levemir 10 units BID  -hold home metformin   Anemia of critical illness. -trend CBC  -transfuse for Hgb <7% or active bleeding  Best practice:  Diet: TPN DVT prophylaxis: heparin gtt GI prophylaxis: Protonix Mobility: Bed rest / mobilize tolerated  Code Status: full code Disposition: ICU Communication: Daughter updated at bedside 6/23  Labs    CMP Latest Ref Rng & Units 10/21/2019 10/20/2019 10/19/2019  Glucose 70 - 99 mg/dL 148(H) 165(H) 161(H)  BUN 8 - 23 mg/dL 32(H) 33(H) 34(H)  Creatinine 0.61 - 1.24 mg/dL 0.82 0.79 0.82  Sodium 135 - 145 mmol/L 139 138 139  Potassium 3.5 - 5.1 mmol/L 4.0 4.3 4.0  Chloride 98 - 111 mmol/L 107 106 103  CO2 22 - 32 mmol/L 25 24 27   Calcium 8.9 - 10.3 mg/dL 7.9(L) 7.7(L) 7.8(L)  Total Protein 6.5 - 8.1 g/dL - 5.6(L) 5.6(L)  Total Bilirubin 0.3 - 1.2 mg/dL - 0.9 0.9  Alkaline Phos 38 - 126 U/L - 71 73  AST 15 - 41 U/L - 25 25  ALT 0 - 44 U/L - 39 38    CBC Latest Ref Rng & Units 10/21/2019 10/20/2019 10/19/2019  WBC 4.0 - 10.5 K/uL 17.7(H) 20.6(H) 16.7(H)  Hemoglobin 13.0 - 17.0 g/dL 9.2(L) 9.3(L) 9.7(L)  Hematocrit 39 - 52 % 28.3(L) 28.6(L) 29.3(L)  Platelets 150 - 400 K/uL 300 257 227    ABG    Component Value Date/Time   PHART 7.406 10/15/2019 2112   PCO2ART 40.7 10/15/2019 2112   PO2ART 60.7 (L) 10/15/2019 2112   HCO3 25.1 10/15/2019 2112   TCO2 22 10/12/2019 1717   ACIDBASEDEF 2.6 (H) 10/13/2019 0311   O2SAT 91.4 10/15/2019 2112    CBG (last 3)  Recent Labs    10/20/19 2307 10/21/19 0334 10/21/19 0810  Carlos, MSN, NP-C [facility] 10/21/2019, 11:00  AM   Please see [email or web address] for pager details.

## 2019-10-21 NOTE — Progress Notes (Signed)
Hospitalist-CCM Transfer Note  86 y.o. M with hx DM, smoking presented with few days abdominal pain, vomiting.  In ER, astutely noted to have acute abdomen.  Taken to OR for ex-lap on 6/14, remained intubated and on pressors post-op.  Pressors stopped 6/16, extubated 6/17 but remained on HFNC.      Perforated gastric ulcer Peritonitis resolving S/p Phillip Heal patch by Dr. [name] on 6/14 On day 10 Zosyn. Barium study on 6/21 two days ago showed persistent small leak, Gen Surg following expectantly for now. On TPN.    LUE DVT On heparin gtt.  Can transition to [facility] after surgery/LHC plans are finalized.  Acute systolic CHF NSTEMI NSVT Hypertension Cardiology consulted for new CHF, NSTEMI and NSVT.  Plan will be for LHC soon.  Patient on heparin gtt.  Acute hypoxic respiratory failure Now weaned to room air yesterday  Diabetes Glucose well controlled  Anemia

## 2019-10-21 NOTE — Progress Notes (Signed)
9 Days Post-Op  Subjective: CC: No change overnight. Family at bedside. No nausea or abdominal pain reported. Asking if CT scan can be faster today.   Objective: Vital signs in last 24 hours: Temp:  [97.3 F (36.3 C)-100.5 F (38.1 C)] 99.1 F (37.3 C) (06/23 0341) Pulse Rate:  [73-89] 83 (06/23 0700) Resp:  [19-32] 21 (06/23 0700) BP: (126-166)/(44-131) 165/57 (06/23 0700) SpO2:  [99 %-100 %] 99 % (06/23 0700) Last BM Date: 10/21/19  Intake/Output from previous day: 06/22 0701 - 06/23 0700 In: 2661.2 [I.V.:2511.6; IV Piggyback:149.6] Out: 2451 [Urine:2300; Emesis/NG output:125; Drains:25; Stool:1] Intake/Output this shift: No intake/output data recorded.  PE: Gen: Awake and alert, NAD Lungs: Coarse at the bases Abd: Soft, ND, tenderness of the epigastrium without rigidity. +BS. JP SS. Midline wound dressed. Ext: No edema   Lab Results:  Recent Labs    10/20/19 0547 10/21/19 0645  WBC 20.6* 17.7*  HGB 9.3* 9.2*  HCT 28.6* 28.3*  PLT 257 300   BMET Recent Labs    10/20/19 0547 10/21/19 0645  NA 138 139  K 4.3 4.0  CL 106 107  CO2 24 25  GLUCOSE 165* 148*  BUN 33* 32*  CREATININE 0.79 0.82  CALCIUM 7.7* 7.9*   PT/INR No results for input(s): LABPROT, INR in the last 72 hours. CMP     Component Value Date/Time   NA 139 10/21/2019 0645   NA 137 11/02/2016 1509   K 4.0 10/21/2019 0645   K 4.4 11/02/2016 1509   CL 107 10/21/2019 0645   CO2 25 10/21/2019 0645   CO2 27 11/02/2016 1509   GLUCOSE 148 (H) 10/21/2019 0645   GLUCOSE 153 (H) 11/02/2016 1509   BUN 32 (H) 10/21/2019 0645   BUN 16.7 11/02/2016 1509   CREATININE 0.82 10/21/2019 0645   CREATININE 0.9 11/02/2016 1509   CALCIUM 7.9 (L) 10/21/2019 0645   CALCIUM 9.1 11/02/2016 1509   PROT 5.6 (L) 10/20/2019 0547   PROT 7.0 11/02/2016 1509   ALBUMIN 1.8 (L) 10/20/2019 0547   ALBUMIN 3.6 11/02/2016 1509   AST 25 10/20/2019 0547   AST 15 11/02/2016 1509   ALT 39 10/20/2019 0547   ALT 12  11/02/2016 1509   ALKPHOS 71 10/20/2019 0547   ALKPHOS 58 11/02/2016 1509   BILITOT 0.9 10/20/2019 0547   BILITOT 0.71 11/02/2016 1509   GFRNONAA >60 10/21/2019 0645   GFRAA >60 10/21/2019 0645   Lipase     Component Value Date/Time   LIPASE 44 10/12/2019 0320       Studies/Results: DG CHEST PORT 1 VIEW  Result Date: 10/20/2019 CLINICAL DATA:  Respiratory failure, acute respiratory failure with hypoxia, history diabetes mellitus, hypertension, prostate cancer EXAM: PORTABLE CHEST 1 VIEW COMPARISON:  Portable exam 0457 hours compared to 10/19/2019 FINDINGS: Nasogastric tube extends into stomach. LEFT arm PICC line tip projects over RIGHT atrium. Borderline enlargement of cardiac silhouette. Mediastinal contours and pulmonary vascularity normal. Bibasilar atelectasis greater on RIGHT. Remaining lungs clear. No pleural effusion or pneumothorax. Bones demineralized. IMPRESSION: Bibasilar atelectasis greater on RIGHT. Electronically Signed   By: Lavonia Dana M.D.   On: 10/20/2019 09:08   DG UGI W SINGLE CM (SOL OR THIN BA)  Result Date: 10/19/2019 CLINICAL DATA:  Bleeding duodenal ulcer repair 5 days ago. Evaluate for leak. EXAM: WATER SOLUBLE UPPER GI SERIES TECHNIQUE: Single-column upper GI series was performed using water soluble contrast. CONTRAST:  300 cc Omnipaque 300. COMPARISON:  None. FLUOROSCOPY TIME:  Fluoroscopy  Time:  3 minutes 6 seconds Radiation Exposure Index (if provided by the fluoroscopic device): 87 mGy Number of Acquired Spot Images: 13 FINDINGS: Scout view of the abdomen shows a nasogastric tube terminating in the stomach. Surgical drain terminates in the left upper quadrant. Stool is seen in the colon. Bibasilar volume loss in the lung bases. 300 cc Omnipaque 300 were hand injected through the patient's nasogastric tube. Stomach opacified. With filling of the duodenum, there is eventual opacification of a linear tract extending from the superior margin of the duodenal bulb.  Duodenal diverticulum is incidentally noted. IMPRESSION: Small leak from the superior margin of the duodenal bulb. Electronically Signed   By: Lorin Picket M.D.   On: 10/19/2019 11:52    Anti-infectives: Anti-infectives (From admission, onward)   Start     Dose/Rate Route Frequency Ordered Stop   10/12/19 2000  piperacillin-tazobactam (ZOSYN) IVPB 3.375 g     Discontinue     3.375 g 12.5 mL/hr over 240 Minutes Intravenous Every 8 hours 10/12/19 1911     10/12/19 1708  ceFAZolin (ANCEF) 2-4 GM/100ML-% IVPB       Note to Pharmacy: Bennye Alm   : cabinet override      10/12/19 1708 10/12/19 1849       Assessment/Plan Remote hx of prostate cancer s/p radiation HTN - off pressors HLD GERD - Protonix DM2 VT  - on amio drip Hyperglycemia - SSI Hypokalemia   Severe Protein calorie malnutrition - Pre-alb < 5. TPN  Perforated Duodenal Ulcer S/p Exploratory laparotomy, omental patch repair of duodenal ulcer - Dr. [name] - 10/12/2019 - POD #9 - Maintain JP drain -TPN - Continue NGT today - UGI shows small leak from repair no bile from JP at this point. Will obtain CT A/P today to further evaluate and see if any 'drainable' collection - We will follow along closely. Appreciate CCM assistance  FEN -NPO, NGT, IVF, TPN VTE - SCDs ID -Zosyn 6/14 >> day 9 Foley - per CCM Follow-Up - as per primary  Plan: CT A/P today - continue NPO status, TPN    LOS: 9 days    Jillyn Ledger , [facility] Surgery 10/21/2019, 7:49 AM Please see Amion for pager number during day hours 7:00am-4:30pm

## 2019-10-21 NOTE — Progress Notes (Signed)
Pharmacy Brief Note  86 y/o M on heparin drip for ACS.   O: HL 0.36  A: HL at goal with no bleeding per RN.   P: Continue heparin infusion at 1200 units/hr. Will recheck HL with AM labs.   Ulice Dash, PharmD, BCPS

## 2019-10-21 NOTE — Progress Notes (Signed)
ANTICOAGULATION CONSULT NOTE - Follow Up Consult  Pharmacy Consult for Heparin Indication: chest pain/ACS  Allergies  Allergen Reactions  . Losartan Potassium Other (See Comments)    Unknown reaction that happened 15 years ago    Patient Measurements: Height: 5\' 11"  (180.3 cm) (per daughter) Weight: 96.4 kg (212 lb 8.4 oz) IBW/kg (Calculated) : 75.3 Heparin Dosing Weight: 94 kg  Vital Signs: Temp: 99.1 F (37.3 C) (06/23 0341) Temp Source: Axillary (06/23 0341) BP: 165/57 (06/23 0700) Pulse Rate: 83 (06/23 0700)  Labs: Recent Labs    10/19/19 0248 10/19/19 0248 10/19/19 0650 10/20/19 0547 10/20/19 0623 10/21/19 0645  HGB 9.7*   < >  --  9.3*  --  9.2*  HCT 29.3*  --   --  28.6*  --  28.3*  PLT 227  --   --  257  --  300  HEPARINUNFRC >2.20*   < > 0.39  --  0.35 0.27*  CREATININE 0.82  --   --  0.79  --  0.82   < > = values in this interval not displayed.    Estimated Creatinine Clearance: 79.4 mL/min (by C-G formula based on SCr of 0.82 mg/dL).   Medications:  Infusions:  . sodium chloride    . sodium chloride Stopped (10/20/19 2021)  . heparin 1,150 Units/hr (10/21/19 0600)  . piperacillin-tazobactam (ZOSYN)  IV 12.5 mL/hr at 10/21/19 0600  . TPN ADULT (ION) 95 mL/hr at 10/21/19 0600  . TPN ADULT (ION)      Assessment: 86 yo male with perforated duodenal ulcer s/p omental patch repair.  Pharmacy consulted to dose IV heparin for NSTEMI as well as for acute deep vein thrombosis involving the left axillary vein, surrounding the PIC line. Findings consistent with acute superficial vein thrombosis involving the left cephalic vein.  Today, 10/21/2019   Heparin level subtherapeutic 0.27 on 1150 units/hr (levels have been therapeutic on this same rate since 6/18)  Hgb 9.2, Plts wnl  No IV site/line interruptions per d/w RN  Incisional and rectal bleeding noted 6/21, none further noted since  Goal of Therapy:  Heparin level 0.3-0.7 units/ml Monitor  platelets by anticoagulation protocol: Yes   Plan:  Increase heparin drip slightly to 1200 units/hr Recheck heparin level in 8hr Daily heparin level and CBC Continue to monitor for signs/symptoms of bleeding  Peggyann Juba, PharmD, BCPS Pharmacy: [phone] 10/21/2019,8:03 AM

## 2019-10-21 NOTE — Evaluation (Signed)
Occupational Therapy Evaluation Patient Details Name: Matthew Schwartz MRN: [id number] DOB: Jul 31, 1932 Today's Date: 10/21/2019    History of Present Illness Pt s/p exploratory lap (10/12/19) to repair perforated duodenal ulcer and then on vent with extubation 10/14/19.  Pt with acute respiratory failure with hypoxia and acute systolic CHF in setting of sepsis and peritonitis.  Pt with hx of DM, prostate CA and bil TKR   Clinical Impression   Mr. Matthew Schwartz is an 86 year old man s/p exploratory laparotomy, acute respiratory failure, NSTEMI,sepsis and acute CHF who presents with significant generalized weakness, decreased activity tolerance and complaints of abdominal pain resulting in difficulty with transfers, ambulation and self care tasks. Patient currently limited by pain, fatigue, multiple IV lines, NG tube, and NPO status. Patient will benefit from skilled OT services to improve deficits and learn compensatory strategies as needed in order to improve functional abilities. Patient will benefit from short term rehab at discharge from hospital for aggressive therapy to return to independent status.    Follow Up Recommendations  SNF;CIR    Equipment Recommendations   (TBD)    Recommendations for Other Services       Precautions / Restrictions Precautions Precautions: Fall Precaution Comments: drain on right abd, abd incision, monitor VS Restrictions Weight Bearing Restrictions: No      Mobility Bed Mobility Overal bed mobility: Needs Assistance Bed Mobility: Rolling Rolling: +2 for physical assistance;Total assist;+2 for safety/equipment         General bed mobility comments: Attempted to roll patient - patient refused mid roll due to pain. Reports fatigue s/p CT scan. Patient did demonstrate ability to pull knees up, place feet on bed and perform partial bridge.  Transfers                 General transfer comment: Refused sitting and further transfers due to pain and  fatigue.    Balance                                           ADL either performed or assessed with clinical judgement   ADL Overall ADL's : Needs assistance/impaired Eating/Feeding: Set up;Moderate assistance;Bed level   Grooming: Minimal assistance;Bed level;Wash/dry face;Set up;Cueing for compensatory techniques   Upper Body Bathing: Maximal assistance;Set up;Bed level;Cueing for compensatory techniques   Lower Body Bathing: Maximal assistance;Bed level;Set up;Cueing for compensatory techniques   Upper Body Dressing : Bed level;Maximal assistance;Set up;Cueing for compensatory techniques   Lower Body Dressing: Total assistance;Bed level;+2 for physical assistance     Toilet Transfer Details (indicate cue type and reason): N/A Toileting- Clothing Manipulation and Hygiene: Bed level;Total assistance;+2 for physical assistance     Tub/Shower Transfer Details (indicate cue type and reason): N/A   General ADL Comments: Patient did not perform mobility during evaluation - limited by pain and fatigue.     Vision   Vision Assessment?: No apparent visual deficits     Perception     Praxis      Pertinent Vitals/Pain Pain Score: 5  Pain Location: abdomen Pain Descriptors / Indicators: Sore;Grimacing;Guarding Pain Intervention(s): Limited activity within patient's tolerance;Monitored during session     Hand Dominance Right   Extremity/Trunk Assessment Upper Extremity Assessment Upper Extremity Assessment: RUE deficits/detail;LUE deficits/detail RUE Deficits / Details: shoulder 3-/5 (AAROM to raise arm), 4+/5 bicep, 4/5 tricep, 5/5 wrist, 4/5 grip LUE Deficits / Details: 3+/5 shoulder, 4+/5  bicep, 4/5 tricep, 5/5 wrist, 4/5 grip   Lower Extremity Assessment Lower Extremity Assessment: Defer to PT evaluation       Communication Communication Communication: Expressive difficulties (Difficulty speaking with NG tube s/p extubation)   Cognition  Arousal/Alertness: Awake/alert Behavior During Therapy: Flat affect Overall Cognitive Status: Within Functional Limits for tasks assessed                                     General Comments       Exercises     Shoulder Instructions      Home Living Family/patient expects to be discharged to:: Skilled nursing facility Living Arrangements: Spouse/significant other Available Help at Discharge: Family Type of Home: House Home Access: Stairs to enter Technical brewer of Steps: 5 Entrance Stairs-Rails: Right;Left;Can reach both Home Layout: One level               Home Equipment: None          Prior Functioning/Environment Level of Independence: Independent                 OT Problem List: Decreased strength;Decreased range of motion;Decreased activity tolerance;Decreased knowledge of use of DME or AE;Pain      OT Treatment/Interventions: Self-care/ADL training;Therapeutic exercise;DME and/or AE instruction;Therapeutic activities;Patient/family education;Neuromuscular education;Balance training    OT Goals(Current goals can be found in the care plan section) Acute Rehab OT Goals Patient Stated Goal: be more independent OT Goal Formulation: With patient/family Time For Goal Achievement: 10/29/19 Potential to Achieve Goals: Fair  OT Frequency: Min 2X/week   Barriers to D/C:            Co-evaluation              AM-PAC OT "6 Clicks" Daily Activity     Outcome Measure Help from another person eating meals?: A Lot Help from another person taking care of personal grooming?: A Little Help from another person toileting, which includes using toliet, bedpan, or urinal?: Total Help from another person bathing (including washing, rinsing, drying)?: Total Help from another person to put on and taking off regular upper body clothing?: A Lot Help from another person to put on and taking off regular lower body clothing?: Total 6 Click Score:  10   End of Session Nurse Communication:  (Okay to see per Nursing, pain medicine given.)  Activity Tolerance: Patient limited by pain;Patient limited by fatigue Patient left: in bed;with call bell/phone within reach  OT Visit Diagnosis: Muscle weakness (generalized) (M62.81);Pain                Time: 6837-2902 OT Time Calculation (min): 18 min Charges:  OT General Charges $OT Visit: 1 Visit OT Evaluation $OT Eval Moderate Complexity: 1 Mod  Sarea Fyfe, OTR/L [place]  Office [phone] Pager: [place] 10/21/2019, 4:08 PM

## 2019-10-22 ENCOUNTER — Inpatient Hospital Stay (HOSPITAL_COMMUNITY): Payer: Medicare Other

## 2019-10-22 ENCOUNTER — Encounter (HOSPITAL_COMMUNITY): Payer: Self-pay | Admitting: Internal Medicine

## 2019-10-22 LAB — GLUCOSE, CAPILLARY
Glucose-Capillary: 159 mg/dL — ABNORMAL HIGH (ref 70–99)
Glucose-Capillary: 130 mg/dL — ABNORMAL HIGH (ref 70–99)
Glucose-Capillary: 122 mg/dL — ABNORMAL HIGH (ref 70–99)
Glucose-Capillary: 153 mg/dL — ABNORMAL HIGH (ref 70–99)
Glucose-Capillary: 145 mg/dL — ABNORMAL HIGH (ref 70–99)

## 2019-10-22 LAB — CBC WITH DIFFERENTIAL/PLATELET
Abs Immature Granulocytes: 0.27 10*3/uL — ABNORMAL HIGH (ref 0.00–0.07)
Basophils Absolute: 0.1 10*3/uL (ref 0.0–0.1)
Basophils Relative: 0 %
Eosinophils Absolute: 0.3 10*3/uL (ref 0.0–0.5)
Eosinophils Relative: 2 %
HCT: 25.5 % — ABNORMAL LOW (ref 39.0–52.0)
Hemoglobin: 8.2 g/dL — ABNORMAL LOW (ref 13.0–17.0)
Immature Granulocytes: 2 %
Lymphocytes Relative: 8 %
Lymphs Abs: 1.3 10*3/uL (ref 0.7–4.0)
MCH: 26.1 pg (ref 26.0–34.0)
MCHC: 32.2 g/dL (ref 30.0–36.0)
MCV: 81.2 fL (ref 80.0–100.0)
Monocytes Absolute: 0.7 10*3/uL (ref 0.1–1.0)
Monocytes Relative: 5 %
Neutro Abs: 12.9 10*3/uL — ABNORMAL HIGH (ref 1.7–7.7)
Neutrophils Relative %: 83 %
Platelets: 329 10*3/uL (ref 150–400)
RBC: 3.14 MIL/uL — ABNORMAL LOW (ref 4.22–5.81)
RDW: 17.1 % — ABNORMAL HIGH (ref 11.5–15.5)
WBC: 15.5 10*3/uL — ABNORMAL HIGH (ref 4.0–10.5)
nRBC: 0 % (ref 0.0–0.2)

## 2019-10-22 LAB — COMPREHENSIVE METABOLIC PANEL
ALT: 46 U/L — ABNORMAL HIGH (ref 0–44)
AST: 31 U/L (ref 15–41)
Albumin: 1.9 g/dL — ABNORMAL LOW (ref 3.5–5.0)
Alkaline Phosphatase: 71 U/L (ref 38–126)
Anion gap: 6 (ref 5–15)
BUN: 35 mg/dL — ABNORMAL HIGH (ref 8–23)
CO2: 24 mmol/L (ref 22–32)
Calcium: 7.8 mg/dL — ABNORMAL LOW (ref 8.9–10.3)
Chloride: 109 mmol/L (ref 98–111)
Creatinine, Ser: 0.95 mg/dL (ref 0.61–1.24)
GFR calc Af Amer: >60 mL/min (ref >60)
GFR calc non Af Amer: >60 mL/min (ref >60)
Glucose, Bld: 166 mg/dL — ABNORMAL HIGH (ref 70–99)
Potassium: 4.2 mmol/L (ref 3.5–5.1)
Sodium: 139 mmol/L (ref 135–145)
Total Bilirubin: 1 mg/dL (ref 0.3–1.2)
Total Protein: 6 g/dL — ABNORMAL LOW (ref 6.5–8.1)

## 2019-10-22 LAB — PHOSPHORUS: Phosphorus: 2.9 mg/dL (ref 2.5–4.6)

## 2019-10-22 LAB — HEPARIN LEVEL (UNFRACTIONATED): Heparin Unfractionated: 0.35 IU/mL (ref 0.30–0.70)

## 2019-10-22 LAB — MAGNESIUM: Magnesium: 2.4 mg/dL (ref 1.7–2.4)

## 2019-10-22 MED ORDER — FENTANYL CITRATE (PF) 100 MCG/2ML IJ SOLN
INTRAMUSCULAR | Status: AC
  Filled 2019-10-22: qty 2

## 2019-10-22 MED ORDER — MIDAZOLAM HCL 2 MG/2ML IJ SOLN
INTRAMUSCULAR | Status: AC | PRN
  Administered 2019-10-22: 1 mg via INTRAVENOUS

## 2019-10-22 MED ORDER — NALOXONE HCL 0.4 MG/ML IJ SOLN
INTRAMUSCULAR | Status: AC
  Filled 2019-10-22: qty 1

## 2019-10-22 MED ORDER — LORAZEPAM 2 MG/ML IJ SOLN
0.5000 mg | Freq: Four times a day (QID) | INTRAMUSCULAR | Status: DC | PRN
  Administered 2019-10-22 – 2019-10-23 (×2): 0.5 mg via INTRAVENOUS
  Filled 2019-10-22 (×2): qty 1

## 2019-10-22 MED ORDER — FENTANYL CITRATE (PF) 100 MCG/2ML IJ SOLN
INTRAMUSCULAR | Status: AC | PRN
  Administered 2019-10-22: 25 ug via INTRAVENOUS

## 2019-10-22 MED ORDER — TRAVASOL 10 % IV SOLN
INTRAVENOUS | Status: AC
  Filled 2019-10-22: qty 1208.4

## 2019-10-22 MED ORDER — MIDAZOLAM HCL 2 MG/2ML IJ SOLN
INTRAMUSCULAR | Status: AC
  Filled 2019-10-22: qty 2

## 2019-10-22 MED ORDER — LIDOCAINE HCL (PF) 1 % IJ SOLN
INTRAMUSCULAR | Status: AC | PRN
  Administered 2019-10-22: 10 mL via INTRADERMAL

## 2019-10-22 MED ORDER — FLUMAZENIL 0.5 MG/5ML IV SOLN
INTRAVENOUS | Status: AC
  Filled 2019-10-22: qty 5

## 2019-10-22 NOTE — Procedures (Addendum)
Interventional Radiology Procedure Note  Procedure: Image guided drain placement, RUQ.  28F pigtail drain.  Complications: None  EBL: None Sample: Culture sent.  ~10cc of grayish purulent fluid aspirated  Recommendations: - Routine drain care, with sterile flushes, record output - follow up Cx - routine wound care - ok to start back [facility] Signed,  Dulcy Fanny. Earleen Newport, DO

## 2019-10-22 NOTE — Progress Notes (Signed)
PHARMACY - TOTAL PARENTERAL NUTRITION CONSULT NOTE   Indication: Prolonged ileus  Patient Measurements: Height: 5\' 11"  (180.3 cm) (per daughter) Weight: 96.4 kg (212 lb 8.4 oz) IBW/kg (Calculated) : 75.3   Body mass index is 29.64 kg/m. Usual Weight: 90.7 kg   Assessment:  - Pharmacy is consulted to start TPN in 86 yo male with prolonged ileus. Pt underwent exploratory laparotomy on 6/14 which revealed perforated duodenal ulcer.   Glucose / Insulin: hx of DM - on metformin PTA.  - CBGs near goal with insulin in TPN, on rSSI q4h (range 137-159; goal 100-150) - 19 units of SS insulin required yesterday - Levemir 10 units BID per CCM started 6/18  - 6/18 Added 25 units of regular insulin to TPN, increased 45 units 6/20 Electrolytes: K (target >/= 4.0) is 4.2. Magnesium 2.4 (target >/= 2.0); others wnl including corrected calcium 9.48 Renal: SCr now WNL,  BUN elevated; bicarb WNL, UOP increased after receiving Lasix 6/16-6/17 LFTs / TGs: LFTs remain WNL, Tbili improved to WNL after procedure; TG are now WNL after stopping propofol. Previously elevated level likely spurious  Prealbumin / albumin: prealbumin <5 (6/16), 9.0 (6/21); albumin 1.8 Intake / Output; MIVF: -862 yesterday; on NS at [facility]; NG output 400  GI Imaging:  - 6/21 Upper GI series: small leak from superior margin of duodenal bulb. - 6/23 CT abdomen: 4.3 cm air-fluid collection in the right upper quadrant of the abdomen between the duodenal bulb and gallbladder, not containing enteric contrast. No residual leak from the duodenal bulb identified. Surgeries / Procedures:  - 6/14 Exploratory laparotomy which revealed perforated duodenal ulcer.   Central access:  PICC Line 6/15 TPN start date: 6/15  Nutritional Goals:   (per RD recommendation on 6/15): Kcal:  2200-2400 Protein:  115-125 grams Fluid:  >2L/d Goal TPN rate is 95 mL/hr (provides 121 g of protein and 2262 kcals per day)  Current Nutrition:  NPO   Plan:     Continue TPN to 95 mL/hr at 1800, the target rate   Electrolytes in TPN: 80 mEq/L of Na, 80 mEq/L of K (max in TPN per [facility] policy) , 4 mEq/L of Ca, 10 mEq/L of Magnesium, and 10 mmol/L of Phos. Cl:Ac ratio max Ac  Standard MVI and trace elements to TPN  Continue SSI resistant  q4h SSI, and monitor Levemir and adjust as needed   Increase 50 units of regular insulin to TPN   Continue MIVF to KVO mL/hr at 1800  Monitor TPN labs on Mon/Thurs;    Peggyann Juba, PharmD, BCPS Pharmacy: [phone] 10/22/2019 7:13 AM

## 2019-10-22 NOTE — Progress Notes (Signed)
ANTICOAGULATION CONSULT NOTE - Follow Up Consult  Pharmacy Consult for Heparin Indication: chest pain/ACS  Allergies  Allergen Reactions  . Losartan Potassium Other (See Comments)    Unknown reaction that happened 15 years ago    Patient Measurements: Height: 5\' 11"  (180.3 cm) (per daughter) Weight: 96.4 kg (212 lb 8.4 oz) IBW/kg (Calculated) : 75.3 Heparin Dosing Weight: 94 kg  Vital Signs: Temp: 99.6 F (37.6 C) (06/24 0343) Temp Source: Axillary (06/24 0343) BP: 119/50 (06/24 0600) Pulse Rate: 77 (06/24 0600)  Labs: Recent Labs    10/20/19 0547 10/20/19 0623 10/21/19 0645 10/21/19 1737 10/22/19 0410  HGB 9.3*   < > 9.2*  --  8.2*  HCT 28.6*  --  28.3*  --  25.5*  PLT 257  --  300  --  329  HEPARINUNFRC  --    < > 0.27* 0.36 0.35  CREATININE 0.79  --  0.82  --  0.95   < > = values in this interval not displayed.    Estimated Creatinine Clearance: 68.5 mL/min (by C-G formula based on SCr of 0.95 mg/dL).   Medications:  Infusions:  . sodium chloride    . sodium chloride Stopped (10/20/19 2021)  . heparin 1,200 Units/hr (10/21/19 1624)  . piperacillin-tazobactam (ZOSYN)  IV 3.375 g (10/22/19 0412)  . TPN ADULT (ION) 95 mL/hr at 10/21/19 1745    Assessment: 86 yo male with perforated duodenal ulcer s/p omental patch repair.  Pharmacy consulted to dose IV heparin for NSTEMI as well as for acute deep vein thrombosis involving the left axillary vein, surrounding the PIC line. Findings consistent with acute superficial vein thrombosis involving the left cephalic vein.  Today, 10/22/2019   Heparin level subtherapeutic 0.35 on 1200 units/hr   Hgb 8.2, Plts wnl  Incisional and rectal bleeding noted 6/21, none further noted since  Goal of Therapy:  Heparin level 0.3-0.7 units/ml Monitor platelets by anticoagulation protocol: Yes   Plan:  Continue hparin drip at 1200 units/hr Daily heparin level and CBC Continue to monitor for signs/symptoms of  bleeding  Peggyann Juba, PharmD, BCPS Pharmacy: [phone] 10/22/2019,7:03 AM

## 2019-10-22 NOTE — Progress Notes (Addendum)
Nutrition Follow-up  DOCUMENTATION CODES:   Not applicable  INTERVENTION:  - continue TPN regimen per Pharmacist. - will monitor for ability to transition to TF vs diet advancement. - weigh patient today.   NUTRITION DIAGNOSIS:   Inadequate oral intake related to inability to eat as evidenced by NPO status. -ongoing  GOAL:   Patient will meet greater than or equal to 90% of their needs -met with TPN  MONITOR:   Diet advancement, Labs, Weight trends, Other (Comment) (TPN regimen)  ASSESSMENT:   Pt admitted with obstipation. PMH includes prostate cancer s/p radiation, HTN, HLD, GERD, T2DM.  Significant Events: 6/14- admission; intubation; NGT placement (gastric); ex lap with omental patch repair of perforated duodenal ulcer 6/15- initial RD assessment; triple lumen PICC placed in L basilic; TPN initiation 9/19- extubation 6/21- UGI which showed a small leak from repair   Patient has NGT to LIS with 100 ml output this AM. Patient is sleeping with family member at bedside. He continues to receive TPN at goal rate of 95 ml/hr which is providing 2262 kcal and 121 grams protein. No plan for diet advancement at this time d/t small leak noted on UGI. Plan for IR drain today as CT yesterday showed 4.3 cm air-fluid collection in RUQ.    Labs reviewed; CBGs: 159 and 130 mg/dl, BUN: 35 mg/dl, Ca: 7.8 mg/dl. Medications reviewed; sliding scale novolog, 10 units levemir BID, 40 mg IV protonix BID.   Diet Order:   Diet Order            Diet NPO time specified  Diet effective now                 EDUCATION NEEDS:   Not appropriate for education at this time  Skin:  Skin Assessment: Skin Integrity Issues: Skin Integrity Issues:: Incisions Incisions: abdomen (6/14)  Last BM:  6/23  Height:   Ht Readings from Last 1 Encounters:  10/13/19 '5\' 11"'  (1.803 m)    Weight:   Wt Readings from Last 1 Encounters:  10/13/19 96.4 kg    Estimated Nutritional Needs:  Kcal:   2200-2400 Protein:  115-125 grams Fluid:  >2L/d     Jarome Matin, MS, RD, LDN, CNSC Inpatient Clinical Dietitian RD pager # available in [place]  After hours/weekend pager # available in [facility]

## 2019-10-22 NOTE — Progress Notes (Signed)
PT Cancellation Note  Patient Details Name: Matthew Schwartz MRN: [id number] DOB: Oct 04, 1932   Cancelled Treatment:    Reason Eval/Treat Not Completed: Patient at procedure or test/unavailable, to IR.   Claretha Cooper 10/22/2019, 1:04 PM [place] Pager [phone] Office [phone]

## 2019-10-22 NOTE — Progress Notes (Signed)
Chief Complaint: Patient was seen in consultation today for abscess drainage  Referring Physician(s): Dr. Annye English  Supervising Physician: Corrie Mckusick  Patient Status: [facility] - In-pt  History of Present Illness: Matthew Schwartz is a 86 y.o. male who is s/p omental patch repair of perf duodenal ulcer. He has developed post op air/fluid collection concerning for leak/abscess. IR is asked to place perc drain. PMHx, meds, labs, imaging, allergies reviewed. Has been NPO today as directed. Daughter at bedside.   Past Medical History:  Diagnosis Date   Diabetes mellitus without complication (HCC)    Dyslipidemia    GERD (gastroesophageal reflux disease)    Hypertension    Prostate cancer ([place]) 12/23/13   Adenocarcinoma   PSA elevation    S/P radiation therapy 05/18/2014 through 07/16/2014    Prostate 7800 cGy in 40 sessions, seminal vesicles 5600 cGy in 40 sessions     Past Surgical History:  Procedure Laterality Date   ANGIOPLASTY  2000   cataract surgery  2000   COLONOSCOPY     LAPAROTOMY N/A 10/12/2019   Procedure: EXPLORATORY LAPAROTOMY omental patch repair perforated duodenal ulcer;  Surgeon: Coralie Keens, MD;  Location: [facility];  Service: General;  Laterality: N/A;   PROSTATE BIOPSY  12/23/13   REPLACEMENT TOTAL KNEE  2008/2009   bilateral    Allergies: Losartan potassium  Medications:  Current Facility-Administered Medications:    0.9 %  sodium chloride infusion, , Intravenous, Continuous, Sood, Vineet, MD   0.9 %  sodium chloride infusion, , Intravenous, PRN, Chesley Mires, MD, Stopped at 10/20/19 2021   acetaminophen (TYLENOL) tablet 650 mg, 650 mg, Oral, Q6H PRN **OR** acetaminophen (TYLENOL) suppository 650 mg, 650 mg, Rectal, Q6H PRN, Allie Bossier, MD   Chlorhexidine Gluconate Cloth 2 % PADS 6 each, 6 each, Topical, Daily, Chesley Mires, MD, 6 each at 10/22/19  8850   cloNIDine (CATAPRES - Dosed in mg/24 hr) patch 0.2 mg, 0.2 mg, Transdermal, Weekly, Christinia Gully B, MD, 0.2 mg at 10/17/19 0917   fentaNYL (SUBLIMAZE) injection 50 mcg, 50 mcg, Intravenous, Q2H PRN, Mannam, Praveen, MD, 50 mcg at 10/22/19 1103   heparin ADULT infusion 100 units/mL (25000 units/24mL sodium chloride 0.45%), 1,200 Units/hr, Intravenous, Continuous, Emiliano Dyer, RPH, Last Rate: 12 mL/hr at 10/22/19 0800, 1,200 Units/hr at 10/22/19 0800   hydrALAZINE (APRESOLINE) injection 20 mg, 20 mg, Intravenous, Q4H PRN, Christinia Gully B, MD   insulin aspart (novoLOG) injection 0-20 Units, 0-20 Units, Subcutaneous, Q4H, Chesley Mires, MD, 3 Units at 10/22/19 0811   insulin detemir (LEVEMIR) injection 10 Units, 10 Units, Subcutaneous, BID, Sood, Vineet, MD, 10 Units at 10/22/19 1030   lidocaine (LIDODERM) 5 % 1 patch, 1 patch, Transdermal, Q24H, Mannam, Praveen, MD, 1 patch at 10/22/19 1106   lidocaine (LIDODERM) 5 % 1 patch, 1 patch, Transdermal, Q24H, Mannam, Praveen, MD, 1 patch at 10/21/19 1806   lip balm (CARMEX) ointment, , Topical, PRN, Tanda Rockers, MD, Given at 10/18/19 2154   MEDLINE mouth rinse, 15 mL, Mouth Rinse, BID, Sood, Vineet, MD, 15 mL at 10/22/19 1020   metoprolol tartrate (LOPRESSOR) injection 5 mg, 5 mg, Intravenous, Q4H, Christinia Gully B, MD, 5 mg at 10/22/19 0811   nitroGLYCERIN (NITROGLYN) 2 % ointment 1 inch, 1 inch, Topical, Q6H, Ogan, [place]wo U, MD, 1 inch at 10/22/19 1107   ondansetron (ZOFRAN) injection 4 mg, 4 mg, Intravenous, Q6H PRN, Allie Bossier, MD, 4 mg at 10/17/19 0838   pantoprazole (PROTONIX) injection 40 mg,  40 mg, Intravenous, Q12H, Salley Slaughter, PA-C, 40 mg at 10/22/19 9242   piperacillin-tazobactam (ZOSYN) IVPB 3.375 g, 3.375 g, Intravenous, Q8H, Adrian Saran, RPH, Last Rate: 12.5 mL/hr at 10/22/19 1109, 3.375 g at 10/22/19 1109   sodium chloride flush (NS) 0.9 % injection 10-40 mL, 10-40 mL, Intracatheter,  Q12H, Sood, Vineet, MD, 10 mL at 10/22/19 6834   sodium chloride flush (NS) 0.9 % injection 10-40 mL, 10-40 mL, Intracatheter, PRN, Chesley Mires, MD, 10 mL at 10/16/19 1701   TPN ADULT (ION), , Intravenous, Continuous TPN, Emiliano Dyer, [facility], Last Rate: 95 mL/hr at 10/21/19 1745, New Bag at 10/21/19 1745   TPN ADULT (ION), , Intravenous, Continuous TPN, Emiliano Dyer, RPH    Family History  Problem Relation Age of Onset   CVA Mother    Heart disease Sister    Coronary artery disease Sister    Heart disease Brother    Coronary artery disease Brother    Heart disease Brother    Coronary artery disease Brother     Social History   Socioeconomic History   Marital status: Married    Spouse name: Not on file   Number of children: Not on file   Years of education: Not on file   Highest education level: Not on file  Occupational History   Occupation: retired  Tobacco Use   Smoking status: Former Smoker    Quit date: 06/14/1994    Years since quitting: 25.3   Smokeless tobacco: Never Used  Vaping Use   Vaping Use: Never used  Substance and Sexual Activity   Alcohol use: No   Drug use: No   Sexual activity: Not on file  Other Topics Concern   Not on file  Social History Narrative   Not on file   Social Determinants of Health   Financial Resource Strain:    Difficulty of Paying Living Expenses:   Food Insecurity:    Worried About Charity fundraiser in the Last Year:    Arboriculturist in the Last Year:   Transportation Needs:    Film/video editor (Medical):    Lack of Transportation (Non-Medical):   Physical Activity:    Days of Exercise per Week:    Minutes of Exercise per Session:   Stress:    Feeling of Stress :   Social Connections:    Frequency of Communication with Friends and Family:    Frequency of Social Gatherings with Friends and Family:    Attends Religious Services:    Active Member of Clubs or  Organizations:    Attends Archivist Meetings:    Marital Status:      Review of Systems: A 12 point ROS discussed and pertinent positives are indicated in the HPI above.  All other systems are negative.  Review of Systems  Vital Signs: BP (!) 150/62 (BP Location: Right Arm)    Pulse 84    Temp 99.5 F (37.5 C) (Axillary)    Resp (!) 21    Ht 5\' 11"  (1.803 m) Comment: per daughter   Wt 96.4 kg    SpO2 99%    BMI 29.64 kg/m   Physical Exam Constitutional:      General: He is not in acute distress.    Appearance: He is well-developed. He is ill-appearing.  Cardiovascular:     Rate and Rhythm: Normal rate and regular rhythm.     Heart sounds: Normal heart sounds.  Pulmonary:  Effort: Pulmonary effort is normal. No respiratory distress.     Breath sounds: Normal breath sounds.  Abdominal:     Palpations: Abdomen is soft.     Tenderness: There is abdominal tenderness.  Skin:    General: Skin is warm.  Neurological:     General: No focal deficit present.     Mental Status: He is alert.       Imaging: DG Chest 1 View  Result Date: 10/13/2019 CLINICAL DATA:  Status post PICC placement. EXAM: CHEST  1 VIEW COMPARISON:  Single-view of the chest today at 4:31 a.m. FINDINGS: Endotracheal tube and NG tube are again seen. The patient has a new left PICC with the tip projecting in the lower superior vena cava. Right pleural effusion and basilar airspace disease persist. There is discoid atelectasis in the left mid lung. No pneumothorax. Heart size is upper normal. IMPRESSION: Tip of new left PICC projects in the lower superior vena cava. Endotracheal tube and NG tube are unchanged. No change in a small right pleural effusion and basilar atelectasis. Electronically Signed   By: Inge Rise M.D.   On: 10/13/2019 19:01   DG Abd 1 View  Result Date: 10/21/2019 CLINICAL DATA:  Postop repair perforated duodenal ulcer. EXAM: ABDOMEN - 1 VIEW COMPARISON:  Upper GI series  10/19/2019.  CT 10/11/2019. FINDINGS: 1108 hours. There is a small amount of residual contrast within the colon. No extraluminal contrast or significant bowel distention identified. Surgical drain extends into the left upper quadrant of the abdomen. There is a nasogastric tube in the mid stomach. IMPRESSION: No acute findings. No significant bowel distension or extraluminal contrast identified. Electronically Signed   By: Richardean Sale M.D.   On: 10/21/2019 13:41   CT ABDOMEN PELVIS W CONTRAST  Result Date: 10/21/2019 CLINICAL DATA:  Perforated gastroduodenal ulcer 8 days ago with surgical repair. Persistent small leak on postoperative upper GI series. EXAM: CT ABDOMEN AND PELVIS WITH CONTRAST TECHNIQUE: Multidetector CT imaging of the abdomen and pelvis was performed using the standard protocol following bolus administration of intravenous contrast. CONTRAST:  120mL OMNIPAQUE IOHEXOL 300 MG/ML SOLN, 172mL OMNIPAQUE IOHEXOL 300 MG/ML SOLN COMPARISON:  Abdominopelvic CT 10/11/2019. Upper GI series 10/19/2019. FINDINGS: Lower chest: New small bilateral pleural effusions with dependent left greater than right lower lobe airspace opacities, probably atelectasis. Central venous catheter extends to the superior cavoatrial junction. There is atherosclerosis the aorta and coronary arteries. Hepatobiliary: The liver is normal in density without suspicious focal abnormality. The gallbladder is incompletely distended. Stable mild extrahepatic biliary dilatation. Pancreas: Stable mild proximal pancreatic ductal dilatation. No focal pancreatic abnormality or surrounding fluid collection. Spleen: Normal in size without focal abnormality. Adrenals/Urinary Tract: Both adrenal glands appear normal. Grossly stable low-density bilateral renal lesions, likely cysts. No hydronephrosis or ureteral calculus. A small amount of gas in the bladder lumen is likely iatrogenic. Stomach/Bowel: Enteric contrast was administered. No  persistent enteric contrast leak from the duodenal bulb identified. There are mild surrounding inflammatory changes as well as an ill-defined air-fluid collection medial to the gallbladder, measuring 4.3 x 3.9 cm on image 26/2. There is no contrast within this collection. The small bowel, colon and appendix demonstrate no significant distension, wall thickening or surrounding inflammation. There are mild diverticular changes within the sigmoid colon. Vascular/Lymphatic: There are no enlarged abdominal or pelvic lymph nodes. Scattered small retroperitoneal lymph nodes are stable. Aortic and branch vessel atherosclerosis without acute vascular findings. The portal, superior mesenteric and splenic veins are patent. Reproductive:  Stable postsurgical changes in the prostate gland. Other: Interval midline abdominal incision with incomplete skin surgical closure. There is a probable hematoma within the anterior abdominal wall superiorly with a fluid-fluid level, measuring 3.0 x 3.6 cm on image 43/2. There are smaller components more inferiorly within the anterior abdominal wall as well as the omentum in the right upper quadrant. As above, there is an air-fluid collection medial to the gallbladder measuring up to 4.3 cm in diameter, not containing contrast. Right-sided abdominal surgical drain extends into the anterior left upper quadrant of the abdomen. No significant fluid collections along the drain. No free air or ascites. Musculoskeletal: No acute or significant osseous findings. IMPRESSION: 1. Interval midline abdominal incision with incomplete skin surgical closure. There are probable small hematomas within the anterior abdominal wall and the omentum. 2. Nonspecific 4.3 cm air-fluid collection in the right upper quadrant of the abdomen between the duodenal bulb and gallbladder, not containing enteric contrast. No residual leak from the duodenal bulb identified. 3. New small bilateral pleural effusions with dependent  left greater than right lower lobe airspace opacities, probably atelectasis. 4. Aortic Atherosclerosis (ICD10-I70.0). Electronically Signed   By: Richardean Sale M.D.   On: 10/21/2019 13:40   CT ABDOMEN PELVIS W CONTRAST  Result Date: 10/09/2019 CLINICAL DATA:  Abdominal pain, constipation, and emesis. History of prostate cancer. EXAM: CT ABDOMEN AND PELVIS WITH CONTRAST TECHNIQUE: Multidetector CT imaging of the abdomen and pelvis was performed using the standard protocol following bolus administration of intravenous contrast. CONTRAST:  179mL OMNIPAQUE IOHEXOL 300 MG/ML  SOLN COMPARISON:  CT abdomen pelvis dated April 16, 2018. FINDINGS: Lower chest: No acute abnormality. Unchanged calcified granulomas in both lower lobes and small subpleural nodules in the right middle lobe. Hepatobiliary: No focal liver abnormality is seen. No gallstones or gallbladder wall thickening. Unchanged mild intra- and extrahepatic biliary the prominence. Pancreas: Unchanged main pancreatic duct measuring up to 5 mm in diameter in the head of the pancreas. No surrounding inflammatory changes. Spleen: Normal in size without focal abnormality. Adrenals/Urinary Tract: Unchanged small bilateral renal cysts. Unchanged 12 mm enhancing lesion in the upper pole of the right knee (series 2, image 5). No renal calculi or hydronephrosis. The bladder is unremarkable. Stomach/Bowel: Stomach is within normal limits. Appendix appears normal. No evidence of bowel wall thickening, distention, or inflammatory changes. Mild left-sided colonic diverticulosis. Vascular/Lymphatic: Aortic atherosclerosis. No enlarged abdominal or pelvic lymph nodes. Reproductive: Unchanged fiduciary markers in the prostate. Other: No abdominal wall hernia or abnormality. No abdominopelvic ascites. No pneumoperitoneum. Musculoskeletal: No acute or significant osseous findings. IMPRESSION: 1. No acute intra-abdominal process. 2. Unchanged 12 mm enhancing lesion in the  upper pole of the right kidney, stable since 2015, likely a low grade renal neoplasm. 3. Aortic Atherosclerosis (ICD10-I70.0). Electronically Signed   By: Titus Dubin M.D.   On: 10/09/2019 14:17   DG CHEST PORT 1 VIEW  Result Date: 10/20/2019 CLINICAL DATA:  Respiratory failure, acute respiratory failure with hypoxia, history diabetes mellitus, hypertension, prostate cancer EXAM: PORTABLE CHEST 1 VIEW COMPARISON:  Portable exam 0457 hours compared to 10/19/2019 FINDINGS: Nasogastric tube extends into stomach. LEFT arm PICC line tip projects over RIGHT atrium. Borderline enlargement of cardiac silhouette. Mediastinal contours and pulmonary vascularity normal. Bibasilar atelectasis greater on RIGHT. Remaining lungs clear. No pleural effusion or pneumothorax. Bones demineralized. IMPRESSION: Bibasilar atelectasis greater on RIGHT. Electronically Signed   By: Lavonia Dana M.D.   On: 10/20/2019 09:08   DG Chest [facility] 1 View  Result  Date: 10/19/2019 CLINICAL DATA:  Acute respiratory failure with hypoxemia. EXAM: PORTABLE CHEST 1 VIEW COMPARISON:  One-view chest x-ray 10/17/2019 FINDINGS: Heart is mildly enlarged. This is exaggerated by low lung volumes. Bibasilar airspace opacities are present. Effusions are noted. Aeration is slightly improved. NG tube courses off the inferior border of the film. Left-sided PICC line is stable. IMPRESSION: Slight improvement in aeration with persistent bibasilar airspace disease and effusions. Electronically Signed   By: San Morelle M.D.   On: 10/19/2019 08:12   DG Chest Port 1 View  Result Date: 10/17/2019 CLINICAL DATA:  Respiratory failure EXAM: PORTABLE CHEST 1 VIEW COMPARISON:  10/15/2019 FINDINGS: Endotracheal tube extends the stomach. Stable cardiac silhouette. There bilateral pleural effusions low lung volumes. No infiltrate. No pneumothorax. IMPRESSION: 1. No significant change. 2. Low lung volumes and bilateral pleural effusion. Electronically Signed    By: Suzy Bouchard M.D.   On: 10/17/2019 09:46   DG Chest Port 1 View  Result Date: 10/15/2019 CLINICAL DATA:  Shortness of breath EXAM: PORTABLE CHEST 1 VIEW COMPARISON:  10/15/2019 FINDINGS: Endotracheal tube has been removed. NG tube is in the stomach. Layering bilateral effusions with bibasilar atelectasis or infiltrates, increasing since prior study. Cardiomegaly with vascular congestion. IMPRESSION: Interval extubation. Increasing layering bilateral effusions and bibasilar atelectasis or infiltrates. Vascular congestion. Electronically Signed   By: Rolm Baptise M.D.   On: 10/15/2019 21:43   DG CHEST PORT 1 VIEW  Result Date: 10/15/2019 CLINICAL DATA:  86 year old male with respiratory insufficiency. EXAM: PORTABLE CHEST 1 VIEW COMPARISON:  Portable chest 10/14/2019 and earlier. FINDINGS: Portable AP semi upright view at 1626 hours. Endotracheal tube tip at the level the clavicles. Enteric tube terminates in the stomach, side hole not clearly identified. Left PICC line remains in place. Paucity of bowel gas in the upper abdomen. Stable lung volumes with continued evidence of bilateral pleural effusions, larger on the right and small to moderate. Stable mild cardiomegaly and mediastinal contours. Perihilar atelectasis suspected. No pneumothorax. No pulmonary edema. Osteopenia. IMPRESSION: 1. Stable lines and tubes. 2. Stable bilateral pleural effusions, greater on the right. Perihilar atelectasis suspected. 3. No new cardiopulmonary abnormality. Electronically Signed   By: Genevie Ann M.D.   On: 10/15/2019 08:54   DG CHEST PORT 1 VIEW  Result Date: 10/14/2019 CLINICAL DATA:  Intubation EXAM: PORTABLE CHEST 1 VIEW COMPARISON:  10/13/2019 FINDINGS: Endotracheal tube and NG tube unchanged. Elevation of RIGHT hemidiaphragm. Small bilateral pleural effusions and basilar atelectasis. Platelike atelectasis in the LEFT mid lung. No pneumothorax. No pulmonary edema. IMPRESSION: 1. No significant interval  change. 2. Bibasilar atelectasis and small effusions. Electronically Signed   By: Suzy Bouchard M.D.   On: 10/14/2019 08:27   DG Chest Port 1 View  Result Date: 10/13/2019 CLINICAL DATA:  Respiratory failure EXAM: PORTABLE CHEST 1 VIEW COMPARISON:  Yesterday FINDINGS: Endotracheal tube with tip at the clavicular heads. The enteric tube at least reaches the diaphragm. Right IJ line with tip at the upper cavoatrial junction. Low volume chest with increased atelectasis. No visible effusion or pneumothorax. Very distorted heart size due to rotation. IMPRESSION: 1. Unremarkable hardware positioning. 2. Increased atelectasis. Electronically Signed   By: Monte Fantasia M.D.   On: 10/13/2019 05:11   DG CHEST PORT 1 VIEW  Result Date: 10/12/2019 CLINICAL DATA:  Status post endotracheal tube placement. EXAM: PORTABLE CHEST 1 VIEW COMPARISON:  December 09, 2006. FINDINGS: The heart size and mediastinal contours are within normal limits. Endotracheal tube appears to be in grossly  good position. Right internal jugular catheter is noted with distal tip in expected position of cavoatrial junction. Nasogastric tube is seen entering stomach. No pneumothorax is noted. Left lung is clear. Mild right basilar subsegmental atelectasis is noted with probable small right pleural effusion. The visualized skeletal structures are unremarkable. IMPRESSION: Endotracheal tube in grossly good position. Mild right basilar subsegmental atelectasis is noted with probable small right pleural effusion. No pneumothorax is noted. Electronically Signed   By: Marijo Conception M.D.   On: 10/12/2019 19:20   DG UGI W SINGLE CM (SOL OR THIN BA)  Result Date: 10/19/2019 CLINICAL DATA:  Bleeding duodenal ulcer repair 5 days ago. Evaluate for leak. EXAM: WATER SOLUBLE UPPER GI SERIES TECHNIQUE: Single-column upper GI series was performed using water soluble contrast. CONTRAST:  300 cc Omnipaque 300. COMPARISON:  None. FLUOROSCOPY TIME:  Fluoroscopy  Time:  3 minutes 6 seconds Radiation Exposure Index (if provided by the fluoroscopic device): 87 mGy Number of Acquired Spot Images: 13 FINDINGS: Scout view of the abdomen shows a nasogastric tube terminating in the stomach. Surgical drain terminates in the left upper quadrant. Stool is seen in the colon. Bibasilar volume loss in the lung bases. 300 cc Omnipaque 300 were hand injected through the patient's nasogastric tube. Stomach opacified. With filling of the duodenum, there is eventual opacification of a linear tract extending from the superior margin of the duodenal bulb. Duodenal diverticulum is incidentally noted. IMPRESSION: Small leak from the superior margin of the duodenal bulb. Electronically Signed   By: Lorin Picket M.D.   On: 10/19/2019 11:52   ECHOCARDIOGRAM COMPLETE  Result Date: 10/13/2019    ECHOCARDIOGRAM REPORT   Patient Name:   Matthew Schwartz Date of Exam: 10/13/2019 Medical Rec #:  [id number]  Height:       71.0 in Accession #:    [id number] Weight:       212.5 lb Date of Birth:  07-29-1932  BSA:          2.164 m Patient Age:    73 years   BP:           115/39 mmHg Patient Gender: M          HR:           80 bpm. Exam Location:  Inpatient Procedure: 2D Echo, Cardiac Doppler, Color Doppler and Intracardiac            Opacification Agent Indications:    R94.31 Abnormal EKG  History:        Patient has no prior history of Echocardiogram examinations.                 Risk Factors:Hypertension, Diabetes, Dyslipidemia and GERD.                 Cancer.  Sonographer:    Tiffany Dance Referring Phys: [id number] Donita Brooks  Sonographer Comments: Echo performed with patient supine and on artificial respirator, suboptimal parasternal window and no subcostal window. IMPRESSIONS  1. Image quality is poor. There appears to be regional wall motion variability, but confident wall motion analysis is impossible, even after contrast administration. Left ventricular ejection fraction, by estimation, is 35 to 40%.  The left ventricle has  moderately decreased function. The left ventricle demonstrates regional wall motion abnormalities. Left ventricular diastolic parameters are consistent with Grade I diastolic dysfunction (impaired relaxation).  2. Right ventricular systolic function is moderately reduced. The right ventricular size is mildly enlarged. Tricuspid regurgitation signal is inadequate  for assessing PA pressure.  3. The mitral valve is normal in structure. No evidence of mitral valve regurgitation.  4. The aortic valve was not well visualized. Aortic valve regurgitation is not visualized. No aortic stenosis is present. Comparison(s): No prior Echocardiogram. FINDINGS  Left Ventricle: Image quality is poor. There appears to be regional wall motion variability, but confident wall motion analysis is impossible, even after contrast administration. Left ventricular ejection fraction, by estimation, is 35 to 40%. The left ventricle has moderately decreased function. The left ventricle demonstrates regional wall motion abnormalities. Definity contrast agent was given IV to delineate the left ventricular endocardial borders. The left ventricular internal cavity size was normal in size. There is no left ventricular hypertrophy. Left ventricular diastolic parameters are consistent with Grade I diastolic dysfunction (impaired relaxation). Normal left ventricular filling pressure. Right Ventricle: The right ventricular size is mildly enlarged. No increase in right ventricular wall thickness. Right ventricular systolic function is moderately reduced. Tricuspid regurgitation signal is inadequate for assessing PA pressure. Left Atrium: Left atrial size was normal in size. Right Atrium: Right atrial size was normal in size. Pericardium: There is no evidence of pericardial effusion. Mitral Valve: The mitral valve is normal in structure. No evidence of mitral valve regurgitation. Tricuspid Valve: The tricuspid valve is not well  visualized. Tricuspid valve regurgitation is not demonstrated. Aortic Valve: The aortic valve was not well visualized. Aortic valve regurgitation is not visualized. No aortic stenosis is present. Pulmonic Valve: The pulmonic valve was not well visualized. Pulmonic valve regurgitation is not visualized. Aorta: The aortic root is normal in size and structure. IAS/Shunts: The interatrial septum was not assessed.   Diastology LV e' lateral:   11.00 cm/s LV E/e' lateral: 4.2 LV e' medial:    5.77 cm/s LV E/e' medial:  8.0  RIGHT VENTRICLE RV Basal diam:  2.00 cm RV S prime:     11.60 cm/s TAPSE (M-mode): 1.5 cm LEFT ATRIUM           Index       RIGHT ATRIUM          Index LA Vol (A2C): 26.8 ml 12.39 ml/m RA Area:     6.72 cm                                   RA Volume:   9.56 ml  4.42 ml/m  AORTIC VALVE LVOT Vmax:   74.80 cm/s LVOT Vmean:  46.800 cm/s LVOT VTI:    0.120 m MITRAL VALVE MV Area (PHT): 2.26 cm    SHUNTS MV Decel Time: 335 msec    Systemic VTI: 0.12 m MV E velocity: 46.00 cm/s MV A velocity: 67.00 cm/s MV E/A ratio:  0.69 Mihai Croitoru MD Electronically signed by Sanda Klein MD Signature Date/Time: 10/13/2019/5:14:25 PM    Final    VAS [place] UPPER EXTREMITY VENOUS DUPLEX  Result Date: 10/17/2019 UPPER VENOUS STUDY  Indications: Edema, Pain, and PICC line LUE Limitations: Line and bandages. Comparison Study: No prior study Performing Technologist: Maudry Mayhew MHA, RDMS, RVT, RDCS  Examination Guidelines: A complete evaluation includes B-mode imaging, spectral Doppler, color Doppler, and power Doppler as needed of all accessible portions of each vessel. Bilateral testing is considered an integral part of a complete examination. Limited examinations for reoccurring indications may be performed as noted.  Right Findings: +----------+------------+---------+-----------+----------+--------------+  RIGHT      Compressible Phasicity Spontaneous Properties  Summary       +----------+------------+---------+-----------+----------+--------------+  Subclavian                                               Not visualized  +----------+------------+---------+-----------+----------+--------------+  Left Findings: +----------+------------+---------+-----------+----------+--------------+  LEFT       Compressible Phasicity Spontaneous Properties    Summary      +----------+------------+---------+-----------+----------+--------------+  IJV            Full        Yes        Yes                                +----------+------------+---------+-----------+----------+--------------+  Subclavian     Full        Yes        Yes                                +----------+------------+---------+-----------+----------+--------------+  Axillary     Partial       Yes        Yes                    Acute       +----------+------------+---------+-----------+----------+--------------+  Brachial       Full                                        Duplicated    +----------+------------+---------+-----------+----------+--------------+  Radial         Full                                                      +----------+------------+---------+-----------+----------+--------------+  Ulnar          Full                                                      +----------+------------+---------+-----------+----------+--------------+  Cephalic       None                                          Acute       +----------+------------+---------+-----------+----------+--------------+  Basilic                                                  Not visualized  +----------+------------+---------+-----------+----------+--------------+  Summary:  Left: Findings consistent with acute deep vein thrombosis involving the left axillary vein, surrounding the PICC line. Findings consistent with acute superficial vein thrombosis involving the left cephalic vein.  *See table(s) above for measurements and observations.  Diagnosing physician:  Servando Snare MD Electronically signed by Erlene Quan  Donzetta Matters MD on 10/17/2019 at 11:18:39 AM.    Final    ECHOCARDIOGRAM LIMITED  Result Date: 10/16/2019    ECHOCARDIOGRAM LIMITED REPORT   Patient Name:   Matthew Schwartz Date of Exam: 10/16/2019 Medical Rec #:  [id number]  Height:       71.0 in Accession #:    [id number] Weight:       212.5 lb Date of Birth:  [birth year]  BSA:          2.164 m Patient Age:    64 years   BP:           150/59 mmHg Patient Gender: M          HR:           77 bpm. Exam Location:  Inpatient Procedure: 2D Echo Indications:    NSTEMI I21.4  History:        Patient has prior history of Echocardiogram examinations, most                 recent 10/13/2019. Risk Factors:Hypertension, Dyslipidemia and                 Diabetes.  Sonographer:    Mikki Santee RDCS (AE) Referring Phys: [place]  1. Image quality remains technically difficult, making wall motion evaluation challenging. There is global left ventricular hypokinesis, but there appears to be disproportionately severe inferolateral wall hypokinesis. Left ventricular ejection fraction, by estimation, is 40 to 45%. The left ventricle has mildly decreased function. The left ventricle has no regional wall motion abnormalities. Left ventricular diastolic parameters are consistent with Grade I diastolic dysfunction (impaired relaxation).  2. Right ventricular systolic function is normal. The right ventricular size is normal.  3. The mitral valve is normal in structure. No evidence of mitral valve regurgitation. No evidence of mitral stenosis.  4. The aortic valve is normal in structure. Aortic valve regurgitation is not visualized. No aortic stenosis is present.  5. The inferior vena cava is normal in size with greater than 50% respiratory variability, suggesting right atrial pressure of 3 mmHg. Comparison(s): Compared to 10/13/2019 The left ventricular function is unchanged. The right ventricular systolic function has improved.  Image quality remains poor. FINDINGS  Left Ventricle: Image quality remains technically difficult, making wall motion evaluation challenging. There is global left ventricular hypokinesis, but there appears to be disproportionately severe inferolateral wall hypokinesis. Left ventricular ejection fraction, by estimation, is 40 to 45%. The left ventricle has mildly decreased function. The left ventricle has no regional wall motion abnormalities. The left ventricular internal cavity size was normal in size. There is no left ventricular hypertrophy. Normal left ventricular filling pressure. Right Ventricle: The right ventricular size is normal. No increase in right ventricular wall thickness. Right ventricular systolic function is normal. Left Atrium: Left atrial size was normal in size. Right Atrium: Right atrial size was normal in size. Pericardium: There is no evidence of pericardial effusion. Mitral Valve: The mitral valve is normal in structure. Normal mobility of the mitral valve leaflets. No evidence of mitral valve stenosis. Tricuspid Valve: The tricuspid valve is normal in structure. Tricuspid valve regurgitation is not demonstrated. No evidence of tricuspid stenosis. Aortic Valve: The aortic valve is normal in structure. Aortic valve regurgitation is not visualized. No aortic stenosis is present. Pulmonic Valve: The pulmonic valve was normal in structure. Pulmonic valve regurgitation is trivial. No evidence of pulmonic stenosis. Aorta: The aortic root and ascending aorta are structurally normal, with  no evidence of dilitation. Venous: The inferior vena cava is normal in size with greater than 50% respiratory variability, suggesting right atrial pressure of 3 mmHg. IAS/Shunts: No atrial level shunt detected by color flow Doppler.  LEFT VENTRICLE PLAX 2D LVIDd:         5.03 cm  Diastology LVIDs:         3.37 cm  LV e' lateral:   9.14 cm/s LV PW:         1.08 cm  LV E/e' lateral: 9.3 LV IVS:        1.13 cm LVOT  diam:     2.40 cm LVOT Area:     4.52 cm  LEFT ATRIUM         Index LA diam:    3.30 cm 1.53 cm/m   AORTA Ao Root diam: 3.70 cm MITRAL VALVE MV Area (PHT): 2.74 cm     SHUNTS MV Decel Time: 277 msec     Systemic Diam: 2.40 cm MV E velocity: 84.90 cm/s MV A velocity: 124.00 cm/s MV E/A ratio:  0.68 Mihai Croitoru MD Electronically signed by Sanda Klein MD Signature Date/Time: 10/16/2019/3:56:53 PM    Final    [place] EKG SITE RITE  Result Date: 10/13/2019 If Site Rite image not attached, placement could not be confirmed due to current cardiac rhythm.  CT Angio Abd/Pel W and/or Wo Contrast  Result Date: 10/11/2019 CLINICAL DATA:  Persistent abdominal pain. Clinical concern for ischemia. EXAM: CTA ABDOMEN AND PELVIS WITHOUT AND WITH CONTRAST TECHNIQUE: Multidetector CT imaging of the abdomen and pelvis was performed using the standard protocol during bolus administration of intravenous contrast. Multiplanar reconstructed images and MIPs were obtained and reviewed to evaluate the vascular anatomy. CONTRAST:  135mL OMNIPAQUE IOHEXOL 350 MG/ML SOLN COMPARISON:  Contrast-enhanced CT 2 days ago. FINDINGS: Place not patient motion artifact limits assessment. VASCULAR Aorta: Atherosclerosis and tortuosity, mild-to-moderate. No aneurysm, dissection, or significant stenosis. No evidence of vasculitis. Celiac: Mild plaque at the origin but no significant stenosis. No dissection or acute findings. Branch vessels are patent, partially obscured by motion. SMA: Mild plaque in the SMA without significant stenosis. No dissection or vasculitis. Replaced right hepatic artery arising from the SMA. Mesenteric branch vessels are patent, allowing for motion limitations. Renals: Minor plaque at the origins without significant stenosis. No dissection or vasculitis. Single bilateral renal arteries. IMA: Patent without significant stenosis. Inflow: Mild atherosclerosis. No aneurysm, significant stenosis, dissection or vasculitis.  Proximal Outflow: Bilateral common femoral and visualized portions of the superficial and profunda femoral arteries are patent without evidence of aneurysm, dissection, vasculitis or significant stenosis. Veins: Venous phase imaging demonstrates patency of the portal and superior mesenteric veins. No evidence of mesenteric venous thrombosis. IVC is unremarkable. Review of the MIP images confirms the above findings. NON-VASCULAR Lower chest: No acute findings. Tiny subpleural nodule in the right lower lobe is unchanged. Calcified granuloma at the lung bases. Hepatobiliary: No focal hepatic lesion. Minimal focal fatty infiltration adjacent to the falciform ligament. Physiologically distended gallbladder without calcified gallstone. Stable mild biliary dilatation with common bile duct measuring 12 mm, unchanged. Pancreas: Stable proximal pancreatic ductal prominence 5 mm. No peripancreatic inflammation. No evidence of pancreatic mass. Spleen: Normal in size without focal abnormality. Adrenals/Urinary Tract: No adrenal nodule. No hydronephrosis. Multiple bilateral low-density lesions in both kidneys, incompletely characterized. Possibly enhancing right upper pole 12 mm lesion, series 11, image 57, unchanged over the past 2 days. Urinary bladder is unremarkable. Stomach/Bowel: Stomach partially distended. Equivocal pre  pyloric gastric wall thickening versus peristalsis. No small bowel obstruction or inflammatory change. Scattered intraluminal fluid within pelvic small bowel, nonspecific. Normal appendix. Moderate volume of formed stool in the ascending and transverse colon. Small volume of formed stool in the descending colon. Scattered descending and sigmoid colonic diverticulosis without diverticulitis. Sigmoid colon is redundant. There is minimal liquid stool in the distal sigmoid colon and rectum. No colonic wall thickening or pericolonic edema, particularly at the splenic flexure. No findings to suggest bowel  ischemia. No pneumatosis. Lymphatic: No adenopathy. Reproductive: No acute findings.  Stable markers in the prostate. Other: No free air, free fluid, or intra-abdominal fluid collection. Musculoskeletal: There are no acute or suspicious osseous abnormalities. IMPRESSION: VASCULAR. 1. Mild aortic and branch atherosclerosis. No acute vascular findings. 2. No significant arterial stenosis of the mesenteric vasculature. Mesenteric vessels are widely patent. NON-VASCULAR. 1. No findings to suggest bowel ischemia. 2. Colonic diverticulosis without diverticulitis. Moderate stool in the proximal colon. 3. Equivocal pre pyloric gastric wall thickening versus peristalsis. 4. Bilateral renal cysts. Possibly enhancing 12 mm lesion upper pole right kidney is unchanged, possibly located renal neoplasm. 5. Stable biliary dilatation. Aortic Atherosclerosis (ICD10-I70.0). Electronically Signed   By: Keith Rake M.D.   On: 10/11/2019 15:55    Labs:  CBC: Recent Labs    10/19/19 0248 10/20/19 0547 10/21/19 0645 10/22/19 0410  WBC 16.7* 20.6* 17.7* 15.5*  HGB 9.7* 9.3* 9.2* 8.2*  HCT 29.3* 28.6* 28.3* 25.5*  PLT 227 257 300 329    COAGS: No results for input(s): INR, APTT in the last 8760 hours.  BMP: Recent Labs    10/19/19 0248 10/20/19 0547 10/21/19 0645 10/22/19 0410  NA 139 138 139 139  K 4.0 4.3 4.0 4.2  CL 103 106 107 109  CO2 27 24 25 24   GLUCOSE 161* 165* 148* 166*  BUN 34* 33* 32* 35*  CALCIUM 7.8* 7.7* 7.9* 7.8*  CREATININE 0.82 0.79 0.82 0.95  GFRNONAA >60 >60 >60 >60  GFRAA >60 >60 >60 >60    LIVER FUNCTION TESTS: Recent Labs    10/17/19 0332 10/19/19 0248 10/20/19 0547 10/22/19 0410  BILITOT 1.0 0.9 0.9 1.0  AST 29 25 25 31   ALT 35 38 39 46*  ALKPHOS 79 73 71 71  PROT 5.4* 5.6* 5.6* 6.0*  ALBUMIN 2.0* 2.0* 1.8* 1.9*    TUMOR MARKERS: No results for input(s): AFPTM, CEA, CA199, CHROMGRNA in the last 8760 hours.  Assessment and Plan: Intra-abdominal fluid  collection post op omental patch repair of perf duodenal ulcer Imaging reviewed and amenable to perc aspiration/drainage. Procedure d/w pt and daughter and he is agreeable Risks and benefits discussed with the patient including bleeding, infection, damage to adjacent structures, bowel perforation/fistula connection, and sepsis.  All of the patient's questions were answered, patient is agreeable to proceed. Consent signed and in chart.    Thank you for this interesting consult.  I greatly enjoyed meeting Matthew Schwartz and look forward to participating in their care.  A copy of this report was sent to the requesting provider on this date.  Electronically Signed: Ascencion Dike, PA-C 10/22/2019, 11:13 AM   I spent a total of 20 minutes in face to face in clinical consultation, greater than 50% of which was counseling/coordinating care for perc abscess drain

## 2019-10-22 NOTE — Progress Notes (Signed)
PROGRESS NOTE  Matthew Schwartz ZOX:[id number] DOB: 05/04/1932 DOA: 10/12/2019 PCP: [name], MD  HPI/Recap of past 24 hours: HPI from PCCM 86 y/o male, former smoker, presented with nausea/vomiting, abdominal pain and constipation x 4 to 5 days.  Seen twice prior to current admit for the same with imaging studies unremarkable for cause.  On return 6/14, seen by GI and found to have concern for acute abdomen and taken for exploratory laparotomy.  Found to have perforated duodenal ulcer s/p omental patch repair.  Remained on vent post op.  PCCM consulted for critical care management.  Patient was subsequently extubated on 6/17 with very slow improvement.  [facility] assumed care on 10/22/2019, PCCM still following.    Today, patient still unable to vocalize, but able to nod to simple questions asked.  Currently denied any worsening abdominal pain, chest pain, shortness of breath, nausea/vomiting, fever/chills.  Daughter at bedside, plan of care explained extensively, all questions answered.     Assessment/Plan: Active Problems:   HTN (hypertension)   Malignant neoplasm of prostate (HCC)   Hyperlipidemia   Obstipation   HLD (hyperlipidemia)   Diabetes mellitus type 2, uncontrolled, with complications (HCC)   Perforated bowel (HCC)   Perforated duodenal ulcer ([place])   Acute respiratory insufficiency   Elevated troponin level   Acute respiratory failure with hypoxemia (HCC)   PVCs (premature ventricular contractions)   Bilateral atelectasis   Acute abdominal pain  Septic shock/peritonitis in the setting of perforated duodenal ulcer status post omental patch repair on 6/14 Shock resolved, off pressors Currently afebrile, with leukocytosis Repeat CT abdomen/pelvis done on 10/21/2019 showed some right upper quadrant fluid collection status post drain placement on 10/22/2019 by IR, removed about 10 cc of greenish purulent fluid Follow-up culture Continue IV Zosyn Continue n.p.o.  status, TPN, NG tube per general surgery Monitor closely  Acute respiratory failure with hypoxia in the setting of septic shock Extubated on 6/17, still unable to vocalize, overall weak Currently on 2 L of O2, saturating well May need SLP to evaluate for speech once okay to tolerate by mouth Continue pulmonary hygiene, incentive spirometry Supplemental oxygen as needed PCCM following closely as well  Acute systolic HF NSTEMI/NSVT History of CAD, hypertension, hyperlipidemia Currently chest pain-free Cardiology consulted, plan for LHC once patient is very stable/able to lie flat without dyspnea/hypoxia Continue Heparin gtt. Continue clonidine patch, IV Lopressor, NGT placed Hold home Norvasc, Toprol, Imdur, ASA, Lipitor  Left upper extremity DVT Cephalic/axillary vein involvement Continue IV Heparin  Diabetes mellitus type 2 A1c done on 10/12/2019 was 6.6 SSI, detemir, Accu-Cheks, hypoglycemic protocol  Anemia of critical illness/post op Daily CBC       Malnutrition Type:  Nutrition Problem: Inadequate oral intake Etiology: inability to eat   Malnutrition Characteristics:  Signs/Symptoms: NPO status   Nutrition Interventions:  Interventions: TPN    Estimated body mass index is 29.64 kg/m as calculated from the following:   Height as of this encounter: 5\' 11"  (1.803 m).   Weight as of this encounter: 96.4 kg.     Code Status: Full  Family Communication: Daughter at bedside  Disposition Plan: Status is: Inpatient  Remains inpatient appropriate because:Inpatient level of care appropriate due to severity of illness   Dispo: The patient is from: Home              Anticipated d/c is to: SNF Vs CIR              Anticipated d/c date is: >  3 days              Patient currently is not medically stable to d/c.   Consultants:  General surgery  PCCM  Cardiology  IR   Procedures:  Omental patch repair on 6/14  Antimicrobials:  Zosyn  DVT  prophylaxis: Heparin drip   Objective: Vitals:   10/22/19 0500 10/22/19 0600 10/22/19 0700 10/22/19 0800  BP: 128/64 (!) 119/50 114/74 (!) 150/62  Pulse: 86 77 75 84  Resp: (!) 31 (!) 21 20 (!) 21  Temp:    99.5 F (37.5 C)  TempSrc:    Axillary  SpO2: 100% 100% 98% 99%  Weight:      Height:        Intake/Output Summary (Last 24 hours) at 10/22/2019 1051 Last data filed at 10/22/2019 0800 Gross per 24 hour  Intake 2290.63 ml  Output 2465 ml  Net -174.37 ml   Filed Weights   10/13/19 0923  Weight: 96.4 kg    Exam:  General: NAD, NGT in place, chronically ill-appearing, deconditioned, unable to vocalize  Cardiovascular: S1, S2 present  Respiratory:  Diminished breath sounds bilaterally  Abdomen: Soft, nontender, nondistended, bowel sounds present, dressing C/D/I  Musculoskeletal: No bilateral pedal edema noted  Skin: Normal  Psychiatry: Fair mood   Data Reviewed: CBC: Recent Labs  Lab 10/18/19 0445 10/19/19 0248 10/20/19 0547 10/21/19 0645 10/22/19 0410  WBC 15.0* 16.7* 20.6* 17.7* 15.5*  NEUTROABS 10.9* 12.9* 17.4* 15.0* 12.9*  HGB 10.5* 9.7* 9.3* 9.2* 8.2*  HCT 31.7* 29.3* 28.6* 28.3* 25.5*  MCV 77.9* 77.7* 80.6 80.2 81.2  PLT 201 227 257 300 194   Basic Metabolic Panel: Recent Labs  Lab 10/18/19 0445 10/19/19 0248 10/20/19 0547 10/21/19 0645 10/22/19 0410  NA 139 139 138 139 139  K 4.0 4.0 4.3 4.0 4.2  CL 99 103 106 107 109  CO2 28 27 24 25 24   GLUCOSE 206* 161* 165* 148* 166*  BUN 33* 34* 33* 32* 35*  CREATININE 0.73 0.82 0.79 0.82 0.95  CALCIUM 8.4* 7.8* 7.7* 7.9* 7.8*  MG 2.4 2.3 2.2 2.3 2.4  PHOS 2.5 2.5 2.9 2.7 2.9   GFR: Estimated Creatinine Clearance: 68.5 mL/min (by C-G formula based on SCr of 0.95 mg/dL). Liver Function Tests: Recent Labs  Lab 10/16/19 0231 10/17/19 0332 10/19/19 0248 10/20/19 0547 10/22/19 0410  AST 33 29 25 25 31   ALT 31 35 38 39 46*  ALKPHOS 71 79 73 71 71  BILITOT 0.9 1.0 0.9 0.9 1.0  PROT  5.3* 5.4* 5.6* 5.6* 6.0*  ALBUMIN 2.0* 2.0* 2.0* 1.8* 1.9*   No results for input(s): LIPASE, AMYLASE in the last 168 hours. No results for input(s): AMMONIA in the last 168 hours. Coagulation Profile: No results for input(s): INR, PROTIME in the last 168 hours. Cardiac Enzymes: No results for input(s): CKTOTAL, CKMB, CKMBINDEX, TROPONINI in the last 168 hours. BNP (last 3 results) No results for input(s): PROBNP in the last 8760 hours. HbA1C: No results for input(s): HGBA1C in the last 72 hours. CBG: Recent Labs  Lab 10/21/19 1621 10/21/19 1948 10/21/19 2340 10/22/19 0337 10/22/19 0806  GLUCAP 135* 145* 150* 159* 130*   Lipid Profile: No results for input(s): CHOL, HDL, LDLCALC, TRIG, CHOLHDL, LDLDIRECT in the last 72 hours. Thyroid Function Tests: No results for input(s): TSH, T4TOTAL, FREET4, T3FREE, THYROIDAB in the last 72 hours. Anemia Panel: No results for input(s): VITAMINB12, FOLATE, FERRITIN, TIBC, IRON, RETICCTPCT in the last 72 hours. Urine  analysis:    Component Value Date/Time   COLORURINE YELLOW 10/11/2019 1202   APPEARANCEUR CLEAR 10/11/2019 1202   LABSPEC 1.024 10/11/2019 1202   PHURINE 6.0 10/11/2019 1202   GLUCOSEU NEGATIVE 10/11/2019 1202   HGBUR NEGATIVE 10/11/2019 1202   BILIRUBINUR NEGATIVE 10/11/2019 1202   KETONESUR NEGATIVE 10/11/2019 1202   PROTEINUR >=300 (A) 10/11/2019 1202   UROBILINOGEN 0.2 07/30/2007 1100   NITRITE NEGATIVE 10/11/2019 1202   LEUKOCYTESUR NEGATIVE 10/11/2019 1202   Sepsis Labs: @LABRCNTIP (procalcitonin:4,lacticidven:4)  ) Recent Results (from the past 240 hour(s))  Urine culture     Status: Abnormal   Collection Time: 10/12/19 12:56 PM   Specimen: Urine, Random  Result Value Ref Range Status   Specimen Description   Final    URINE, RANDOM Performed at [facility], [place] [address] [place], [place] [address]    Special Requests   Final    NONE Performed at [facility], [place] [address], [place], [place] [address]    Culture (A)  Final    <10,000 COLONIES/mL INSIGNIFICANT GROWTH Performed at [place] [address] [place], [place] [address]    Report Status 10/13/2019 FINAL  Final  MRSA PCR Screening     Status: None   Collection Time: 10/12/19  6:45 PM   Specimen: Nasal Mucosa; Nasopharyngeal  Result Value Ref Range Status   MRSA by PCR NEGATIVE NEGATIVE Final    Comment:        The GeneXpert MRSA Assay (FDA approved for NASAL specimens only), is one component of a comprehensive MRSA colonization surveillance program. It is not intended to diagnose MRSA infection nor to guide or monitor treatment for MRSA infections. Performed at [facility], [place] [address] [place], [place] [address]       Studies: DG Abd 1 View  Result Date: 10/21/2019 CLINICAL DATA:  Postop repair perforated duodenal ulcer. EXAM: ABDOMEN - 1 VIEW COMPARISON:  Upper GI series 10/19/2019.  CT 10/11/2019. FINDINGS: 1108 hours. There is a small amount of residual contrast within the colon. No extraluminal contrast or significant bowel distention identified. Surgical drain extends into the left upper quadrant of the abdomen. There is a nasogastric tube in the mid stomach. IMPRESSION: No acute findings. No significant bowel distension or extraluminal contrast identified. Electronically Signed   By: Richardean Sale M.D.   On: 10/21/2019 13:41   CT ABDOMEN PELVIS W CONTRAST  Result Date: 10/21/2019 CLINICAL DATA:  Perforated gastroduodenal ulcer 8 days ago with surgical repair. Persistent small leak on postoperative upper GI series. EXAM: CT ABDOMEN AND PELVIS WITH CONTRAST TECHNIQUE: Multidetector CT imaging of the abdomen and pelvis was performed using the standard protocol following bolus administration of intravenous contrast. CONTRAST:  118mL OMNIPAQUE IOHEXOL 300 MG/ML SOLN, 161mL OMNIPAQUE IOHEXOL 300 MG/ML SOLN COMPARISON:  Abdominopelvic CT  10/11/2019. Upper GI series 10/19/2019. FINDINGS: Lower chest: New small bilateral pleural effusions with dependent left greater than right lower lobe airspace opacities, probably atelectasis. Central venous catheter extends to the superior cavoatrial junction. There is atherosclerosis the aorta and coronary arteries. Hepatobiliary: The liver is normal in density without suspicious focal abnormality. The gallbladder is incompletely distended. Stable mild extrahepatic biliary dilatation. Pancreas: Stable mild proximal pancreatic ductal dilatation. No focal pancreatic abnormality or surrounding fluid collection. Spleen: Normal in size without focal abnormality. Adrenals/Urinary Tract: Both adrenal glands appear normal. Grossly stable low-density bilateral renal lesions, likely cysts. No hydronephrosis or ureteral calculus. A small amount of gas  in the bladder lumen is likely iatrogenic. Stomach/Bowel: Enteric contrast was administered. No persistent enteric contrast leak from the duodenal bulb identified. There are mild surrounding inflammatory changes as well as an ill-defined air-fluid collection medial to the gallbladder, measuring 4.3 x 3.9 cm on image 26/2. There is no contrast within this collection. The small bowel, colon and appendix demonstrate no significant distension, wall thickening or surrounding inflammation. There are mild diverticular changes within the sigmoid colon. Vascular/Lymphatic: There are no enlarged abdominal or pelvic lymph nodes. Scattered small retroperitoneal lymph nodes are stable. Aortic and branch vessel atherosclerosis without acute vascular findings. The portal, superior mesenteric and splenic veins are patent. Reproductive: Stable postsurgical changes in the prostate gland. Other: Interval midline abdominal incision with incomplete skin surgical closure. There is a probable hematoma within the anterior abdominal wall superiorly with a fluid-fluid level, measuring 3.0 x 3.6 cm on  image 43/2. There are smaller components more inferiorly within the anterior abdominal wall as well as the omentum in the right upper quadrant. As above, there is an air-fluid collection medial to the gallbladder measuring up to 4.3 cm in diameter, not containing contrast. Right-sided abdominal surgical drain extends into the anterior left upper quadrant of the abdomen. No significant fluid collections along the drain. No free air or ascites. Musculoskeletal: No acute or significant osseous findings. IMPRESSION: 1. Interval midline abdominal incision with incomplete skin surgical closure. There are probable small hematomas within the anterior abdominal wall and the omentum. 2. Nonspecific 4.3 cm air-fluid collection in the right upper quadrant of the abdomen between the duodenal bulb and gallbladder, not containing enteric contrast. No residual leak from the duodenal bulb identified. 3. New small bilateral pleural effusions with dependent left greater than right lower lobe airspace opacities, probably atelectasis. 4. Aortic Atherosclerosis (ICD10-I70.0). Electronically Signed   By: Richardean Sale M.D.   On: 10/21/2019 13:40    Scheduled Meds: . Chlorhexidine Gluconate Cloth  6 each Topical Daily  . cloNIDine  0.2 mg Transdermal Weekly  . insulin aspart  0-20 Units Subcutaneous Q4H  . insulin detemir  10 Units Subcutaneous BID  . lidocaine  1 patch Transdermal Q24H  . lidocaine  1 patch Transdermal Q24H  . mouth rinse  15 mL Mouth Rinse BID  . metoprolol tartrate  5 mg Intravenous Q4H  . nitroGLYCERIN  1 inch Topical Q6H  . pantoprazole (PROTONIX) IV  40 mg Intravenous Q12H  . sodium chloride flush  10-40 mL Intracatheter Q12H    Continuous Infusions: . sodium chloride    . sodium chloride Stopped (10/20/19 2021)  . heparin 1,200 Units/hr (10/22/19 0800)  . piperacillin-tazobactam (ZOSYN)  IV Stopped (10/22/19 8828)  . TPN ADULT (ION) 95 mL/hr at 10/21/19 1745  . TPN ADULT (ION)       LOS:  10 days     Alma Friendly, MD [facility]  If 7PM-7AM, please contact night-coverage [email or web address] 10/22/2019, 10:51 AM

## 2019-10-22 NOTE — Progress Notes (Signed)
NAME:  Matthew Schwartz, MRN:  [id number], DOB:  05/21/1932, LOS: 10 ADMISSION DATE:  10/12/2019, CONSULTATION DATE:  10/12/2019 REFERRING MD:  Dr. [name], Surgery, CHIEF COMPLAINT:  Abdominal pain  Brief History   86 y/o male, former smoker, presented with nausea/vomiting, abdominal pain and constipation x 4 to 5 days.  Seen twice prior to current admit for the same with imaging studies unremarkable for cause.  On return 6/14, seen by GI and found to have concern for acute abdomen and taken for exploratory laparotomy.  Found to have perforated duodenal ulcer s/p omental patch repair.  Remained on vent post op.  PCCM consulted for critical care management.  Past Medical History  Prostate cancer, HTN, GERD, HLD, DM  Significant Hospital Events   6/14 Admit, exploratory laparotomy, omental patch repair of duodenal ulcer 6/16 Off vasopressors, less drainage from abd drain / clearing  6/17 extubated, weak cough, added high flow oxygen 6/18 start heparin gtt 6/20 d/c amiodarone 6/23 new fluid collection on CT abd 6/24 To [facility], PCCM continuing to follow  Consults:  Gastroenterology General surgery Cardiology  Procedures:  Rt IJ CVL 6/14 >> 6/15 Rt radial a line 6/14 >> 6/17 ETT 6/14 >> 6/17 LUE PICC 6/16 >>     Significant Diagnostic Tests:  CT angio abd/pelvis 6/13 >> mild atherosclerosis, no significant arterial stenosis of mesenteric vasculature, no findings of bowel ischemia, colonic diverticulosis w/o diverticulitis, b/l renal cysts, 12 mm lesion upper pole Rt kidney ECHO 6/15 >> poor image quality, appears to be regional wall motion variability but confident wall motion analysis is impossible even after contrast administration, LVEF ~ 89-21%, Grade I diastolic dysfunction, RV systolic function is moderately reduced, RV is mildly enlarged 6/19 venous doppler LUE >> acute deep vein thrombosis involving the left axillary vein, surrounding the PICC line. Findings consistent with acute  superficial vein thrombosis involving the left cephalic vein. UGI 6/21 >> small leak from duodenum CT ABD/Pelvis 6/23 >> interval midline abdominal incision with incomplete skin surgical closure, probable small hematomas within the anterior abdominal wall and the omentum. Nonspecific 4.3 cm air-fluid collection in the RUQ of the abdomen between the duodenal bulb and gallbladder, not containing enteric contrast. No residual leak from the duodenal bulb identified.  New small bilateral pleural effusions with dependent L>R lower lobe opacities  Micro Data:  SARS CoV2 6/13 >> negative UC 6/14 >> less than 10k insignificant growth   Antimicrobials:  Zosyn 6/14 >>  Interim history/subjective:  On room air  Glucose range 130-166 Tmax 99.6 / WBC 15.5 I/O UOP 2.6L, -862 ml in last 24 hours  Daughter at bedside, reports he got pain medication + anxiety medication overnight and has slept this am  Objective   Blood pressure (!) 150/62, pulse 84, temperature 99.6 F (37.6 C), temperature source Axillary, resp. rate (!) 21, height 5\' 11"  (1.803 m), weight 96.4 kg, SpO2 99 %.        Intake/Output Summary (Last 24 hours) at 10/22/2019 0848 Last data filed at 10/22/2019 0800 Gross per 24 hour  Intake 2290.63 ml  Output 2465 ml  Net -174.37 ml   Filed Weights   10/13/19 0923  Weight: 96.4 kg          Examination: General: elderly adult male lying in bed in NAD   HEENT: MM pink/dry, NGT in place  Neuro: sleeping, awakens on exam, MAE / generalized weakness  CV: s1s2 rrr, no m/r/g PULM: non-labored on RA, lungs bilaterally clear anterior, diminished bases  GI: soft, bsx4 active  Extremities: warm/dry, trace edema  Skin: no rashes or lesions  Resolved Hospital Problem list   Septic shock  Assessment & Plan:   Peritonitis in setting of perforated duodenal ulcer s/p omental patch repair. RUQ Fluid Collection  -zosyn D11/x  -NPO  -anticipate IR placed drain for fluid collection    -follow leukocytosis  -PPI BID  -TPN per [facility]  Acute respiratory failure with hypoxia in setting of sepsis and peritonitis. Extubated 6/17.  Slow improvement.  -continue pulmonary hygiene - mobilize as able, IS -follow intermittent CXR -if evidence of respiratory muscle fatigue, would intubate given GI surgery    NSVT Acute Systolic CHF in setting of Sepsis likely from Demand Ischemia. Hx of CAD, HTN, HLD. -heparin gtt per pharmacy, continue lower limit therapeutic  -SBP goal <140 -appreciate Cardiology > LHC at some point -continue clonidine, IV lopressor, NTG paste -hole home PO regimen - norvasc, toprol, imdur, altace, ASA, lipitor   LUE DVT  Cephalic vein, axillary vein involvement  -continue PICC, treat through with IV heparin as must have access for TPN   DM type II poorly controlled with hyperglycemia. -SSI -continue levemir 10 units BID  -hold home metformin   Anemia of critical illness. -trend CBC  -transfuse for Hgb <7% or active bleeding    Best practice:  Diet: TPN DVT prophylaxis: heparin gtt GI prophylaxis: Protonix Mobility: Bed rest / mobilize tolerated  Code Status: full code Disposition: ICU Communication: Daughter updated at bedside 6/24 on plan of care.  Reviewed transition to [facility].    Labs    CMP Latest Ref Rng & Units 10/22/2019 10/21/2019 10/20/2019  Glucose 70 - 99 mg/dL 166(H) 148(H) 165(H)  BUN 8 - 23 mg/dL 35(H) 32(H) 33(H)  Creatinine 0.61 - 1.24 mg/dL 0.95 0.82 0.79  Sodium 135 - 145 mmol/L 139 139 138  Potassium 3.5 - 5.1 mmol/L 4.2 4.0 4.3  Chloride 98 - 111 mmol/L 109 107 106  CO2 22 - 32 mmol/L 24 25 24   Calcium 8.9 - 10.3 mg/dL 7.8(L) 7.9(L) 7.7(L)  Total Protein 6.5 - 8.1 g/dL 6.0(L) - 5.6(L)  Total Bilirubin 0.3 - 1.2 mg/dL 1.0 - 0.9  Alkaline Phos 38 - 126 U/L 71 - 71  AST 15 - 41 U/L 31 - 25  ALT 0 - 44 U/L 46(H) - 39    CBC Latest Ref Rng & Units 10/22/2019 10/21/2019 10/20/2019  WBC 4.0 - 10.5 K/uL 15.5(H) 17.7(H) 20.6(H)   Hemoglobin 13.0 - 17.0 g/dL 8.2(L) 9.2(L) 9.3(L)  Hematocrit 39 - 52 % 25.5(L) 28.3(L) 28.6(L)  Platelets 150 - 400 K/uL 329 300 257    ABG    Component Value Date/Time   PHART 7.406 10/15/2019 2112   PCO2ART 40.7 10/15/2019 2112   PO2ART 60.7 (L) 10/15/2019 2112   HCO3 25.1 10/15/2019 2112   TCO2 22 10/12/2019 1717   ACIDBASEDEF 2.6 (H) 10/13/2019 0311   O2SAT 91.4 10/15/2019 2112    CBG (last 3)  Recent Labs    10/21/19 2340 10/22/19 0337 10/22/19 0806  GLUCAP 150* 159* [place], MSN, NP-C [facility] 10/22/2019, 8:48 AM   Please see [email or web address] for pager details.

## 2019-10-22 NOTE — Progress Notes (Addendum)
10 Days Post-Op  Subjective: CC: Patient denies any pain or nausea. Last BM 6/23. We discussed IR drain placement and he is agreeable.   Objective: Vital signs in last 24 hours: Temp:  [97.8 F (36.6 C)-99.6 F (37.6 C)] 99.6 F (37.6 C) (06/24 0343) Pulse Rate:  [29-92] 84 (06/24 0800) Resp:  [20-35] 21 (06/24 0800) BP: (114-180)/(35-78) 150/62 (06/24 0800) SpO2:  [50 %-100 %] 99 % (06/24 0800) Last BM Date: 10/21/19  Intake/Output from previous day: 06/23 0701 - 06/24 0700 In: 2152.1 [I.V.:1991.2; IV Piggyback:160.9] Out: 3015 [Urine:2600; Emesis/NG output:400; Drains:15] Intake/Output this shift: Total I/O In: 138.6 [I.V.:94.9; IV Piggyback:43.7] Out: -   PE: Gen: Awake and alert, NAD Lungs: Clear to upper lung fields. Coarse at the bases. Strong cough that is productive.  Abd: Soft, ND, tenderness of the epigastrium without rigidity. +BS. JP SS. Midline wound with healthy granulation tissue at the base. No dehiscence or signs of infection. Redressed. Ext: No edema   Lab Results:  Recent Labs    10/21/19 0645 10/22/19 0410  WBC 17.7* 15.5*  HGB 9.2* 8.2*  HCT 28.3* 25.5*  PLT 300 329   BMET Recent Labs    10/21/19 0645 10/22/19 0410  NA 139 139  K 4.0 4.2  CL 107 109  CO2 25 24  GLUCOSE 148* 166*  BUN 32* 35*  CREATININE 0.82 0.95  CALCIUM 7.9* 7.8*   PT/INR No results for input(s): LABPROT, INR in the last 72 hours. CMP     Component Value Date/Time   NA 139 10/22/2019 0410   NA 137 11/02/2016 1509   K 4.2 10/22/2019 0410   K 4.4 11/02/2016 1509   CL 109 10/22/2019 0410   CO2 24 10/22/2019 0410   CO2 27 11/02/2016 1509   GLUCOSE 166 (H) 10/22/2019 0410   GLUCOSE 153 (H) 11/02/2016 1509   BUN 35 (H) 10/22/2019 0410   BUN 16.7 11/02/2016 1509   CREATININE 0.95 10/22/2019 0410   CREATININE 0.9 11/02/2016 1509   CALCIUM 7.8 (L) 10/22/2019 0410   CALCIUM 9.1 11/02/2016 1509   PROT 6.0 (L) 10/22/2019 0410   PROT 7.0 11/02/2016 1509    ALBUMIN 1.9 (L) 10/22/2019 0410   ALBUMIN 3.6 11/02/2016 1509   AST 31 10/22/2019 0410   AST 15 11/02/2016 1509   ALT 46 (H) 10/22/2019 0410   ALT 12 11/02/2016 1509   ALKPHOS 71 10/22/2019 0410   ALKPHOS 58 11/02/2016 1509   BILITOT 1.0 10/22/2019 0410   BILITOT 0.71 11/02/2016 1509   GFRNONAA >60 10/22/2019 0410   GFRAA >60 10/22/2019 0410   Lipase     Component Value Date/Time   LIPASE 44 10/12/2019 0320       Studies/Results: DG Abd 1 View  Result Date: 10/21/2019 CLINICAL DATA:  Postop repair perforated duodenal ulcer. EXAM: ABDOMEN - 1 VIEW COMPARISON:  Upper GI series 10/19/2019.  CT 10/11/2019. FINDINGS: 1108 hours. There is a small amount of residual contrast within the colon. No extraluminal contrast or significant bowel distention identified. Surgical drain extends into the left upper quadrant of the abdomen. There is a nasogastric tube in the mid stomach. IMPRESSION: No acute findings. No significant bowel distension or extraluminal contrast identified. Electronically Signed   By: Richardean Sale M.D.   On: 10/21/2019 13:41   CT ABDOMEN PELVIS W CONTRAST  Result Date: 10/21/2019 CLINICAL DATA:  Perforated gastroduodenal ulcer 8 days ago with surgical repair. Persistent small leak on postoperative upper GI series. EXAM:  CT ABDOMEN AND PELVIS WITH CONTRAST TECHNIQUE: Multidetector CT imaging of the abdomen and pelvis was performed using the standard protocol following bolus administration of intravenous contrast. CONTRAST:  175mL OMNIPAQUE IOHEXOL 300 MG/ML SOLN, 19mL OMNIPAQUE IOHEXOL 300 MG/ML SOLN COMPARISON:  Abdominopelvic CT 10/11/2019. Upper GI series 10/19/2019. FINDINGS: Lower chest: New small bilateral pleural effusions with dependent left greater than right lower lobe airspace opacities, probably atelectasis. Central venous catheter extends to the superior cavoatrial junction. There is atherosclerosis the aorta and coronary arteries. Hepatobiliary: The liver is  normal in density without suspicious focal abnormality. The gallbladder is incompletely distended. Stable mild extrahepatic biliary dilatation. Pancreas: Stable mild proximal pancreatic ductal dilatation. No focal pancreatic abnormality or surrounding fluid collection. Spleen: Normal in size without focal abnormality. Adrenals/Urinary Tract: Both adrenal glands appear normal. Grossly stable low-density bilateral renal lesions, likely cysts. No hydronephrosis or ureteral calculus. A small amount of gas in the bladder lumen is likely iatrogenic. Stomach/Bowel: Enteric contrast was administered. No persistent enteric contrast leak from the duodenal bulb identified. There are mild surrounding inflammatory changes as well as an ill-defined air-fluid collection medial to the gallbladder, measuring 4.3 x 3.9 cm on image 26/2. There is no contrast within this collection. The small bowel, colon and appendix demonstrate no significant distension, wall thickening or surrounding inflammation. There are mild diverticular changes within the sigmoid colon. Vascular/Lymphatic: There are no enlarged abdominal or pelvic lymph nodes. Scattered small retroperitoneal lymph nodes are stable. Aortic and branch vessel atherosclerosis without acute vascular findings. The portal, superior mesenteric and splenic veins are patent. Reproductive: Stable postsurgical changes in the prostate gland. Other: Interval midline abdominal incision with incomplete skin surgical closure. There is a probable hematoma within the anterior abdominal wall superiorly with a fluid-fluid level, measuring 3.0 x 3.6 cm on image 43/2. There are smaller components more inferiorly within the anterior abdominal wall as well as the omentum in the right upper quadrant. As above, there is an air-fluid collection medial to the gallbladder measuring up to 4.3 cm in diameter, not containing contrast. Right-sided abdominal surgical drain extends into the anterior left upper  quadrant of the abdomen. No significant fluid collections along the drain. No free air or ascites. Musculoskeletal: No acute or significant osseous findings. IMPRESSION: 1. Interval midline abdominal incision with incomplete skin surgical closure. There are probable small hematomas within the anterior abdominal wall and the omentum. 2. Nonspecific 4.3 cm air-fluid collection in the right upper quadrant of the abdomen between the duodenal bulb and gallbladder, not containing enteric contrast. No residual leak from the duodenal bulb identified. 3. New small bilateral pleural effusions with dependent left greater than right lower lobe airspace opacities, probably atelectasis. 4. Aortic Atherosclerosis (ICD10-I70.0). Electronically Signed   By: Richardean Sale M.D.   On: 10/21/2019 13:40    Anti-infectives: Anti-infectives (From admission, onward)   Start     Dose/Rate Route Frequency Ordered Stop   10/12/19 2000  piperacillin-tazobactam (ZOSYN) IVPB 3.375 g     Discontinue     3.375 g 12.5 mL/hr over 240 Minutes Intravenous Every 8 hours 10/12/19 1911     10/12/19 1708  ceFAZolin (ANCEF) 2-4 GM/100ML-% IVPB       Note to Pharmacy: Bennye Alm   : cabinet override      10/12/19 1708 10/12/19 1849       Assessment/Plan Remote hx of prostate cancer s/p radiation HTN - off pressors HLD GERD - Protonix DM2 VT - on amio drip Hyperglycemia - SSI Hypokalemia  LUE DVT - on heparin gtt Severe Protein calorie malnutrition - Pre-alb < 5. TPN ABL Anemia - hgb 8.2, monitor.   Perforated Duodenal Ulcer S/p Exploratory laparotomy, omental patch repair of duodenal ulcer - Dr. [name] - 10/12/2019 - POD #10 - Maintain JP and NGT. UGI shows small leak from repair. No bile from JP at this point.  -TPN - CT 6/23 showed 4.3 cm air-fluid collection int he RUQ not containing enteric contrast. Patient and family agreeable to IR drain - We will follow along closely. Appreciate CCM assistance  FEN  -NPO, NGT, IVF, TPN VTE - SCDs, heparin gtt ID -Zosyn 6/14 >> day10. WBC 15.5 Foley - per CCM Follow-Up - as per primary  Plan:Cont NPO, NGT. IR drain placement.    LOS: 10 days    Jillyn Ledger , [facility] 10/22/2019, 8:33 AM Please see Amion for pager number during day hours 7:00am-4:30pm

## 2019-10-23 LAB — GLUCOSE, CAPILLARY
Glucose-Capillary: 121 mg/dL — ABNORMAL HIGH (ref 70–99)
Glucose-Capillary: 122 mg/dL — ABNORMAL HIGH (ref 70–99)
Glucose-Capillary: 129 mg/dL — ABNORMAL HIGH (ref 70–99)
Glucose-Capillary: 130 mg/dL — ABNORMAL HIGH (ref 70–99)
Glucose-Capillary: 149 mg/dL — ABNORMAL HIGH (ref 70–99)
Glucose-Capillary: 154 mg/dL — ABNORMAL HIGH (ref 70–99)

## 2019-10-23 LAB — COMPREHENSIVE METABOLIC PANEL
ALT: 42 U/L (ref 0–44)
AST: 31 U/L (ref 15–41)
Albumin: 1.6 g/dL — ABNORMAL LOW (ref 3.5–5.0)
Alkaline Phosphatase: 61 U/L (ref 38–126)
Anion gap: 3 — ABNORMAL LOW (ref 5–15)
BUN: 31 mg/dL — ABNORMAL HIGH (ref 8–23)
CO2: 23 mmol/L (ref 22–32)
Calcium: 7.1 mg/dL — ABNORMAL LOW (ref 8.9–10.3)
Chloride: 114 mmol/L — ABNORMAL HIGH (ref 98–111)
Creatinine, Ser: 0.86 mg/dL (ref 0.61–1.24)
GFR calc Af Amer: >60 mL/min (ref >60)
GFR calc non Af Amer: >60 mL/min (ref >60)
Glucose, Bld: 136 mg/dL — ABNORMAL HIGH (ref 70–99)
Potassium: 4.2 mmol/L (ref 3.5–5.1)
Sodium: 140 mmol/L (ref 135–145)
Total Bilirubin: 0.8 mg/dL (ref 0.3–1.2)
Total Protein: 5.6 g/dL — ABNORMAL LOW (ref 6.5–8.1)

## 2019-10-23 LAB — CBC WITH DIFFERENTIAL/PLATELET
Abs Immature Granulocytes: 0.16 10*3/uL — ABNORMAL HIGH (ref 0.00–0.07)
Basophils Absolute: 0.1 10*3/uL (ref 0.0–0.1)
Basophils Relative: 0 %
Eosinophils Absolute: 0.3 10*3/uL (ref 0.0–0.5)
Eosinophils Relative: 2 %
HCT: 27.6 % — ABNORMAL LOW (ref 39.0–52.0)
Hemoglobin: 8.9 g/dL — ABNORMAL LOW (ref 13.0–17.0)
Immature Granulocytes: 1 %
Lymphocytes Relative: 10 %
Lymphs Abs: 1.2 10*3/uL (ref 0.7–4.0)
MCH: 26.3 pg (ref 26.0–34.0)
MCHC: 32.2 g/dL (ref 30.0–36.0)
MCV: 81.7 fL (ref 80.0–100.0)
Monocytes Absolute: 0.7 10*3/uL (ref 0.1–1.0)
Monocytes Relative: 6 %
Neutro Abs: 9.5 10*3/uL — ABNORMAL HIGH (ref 1.7–7.7)
Neutrophils Relative %: 81 %
Platelets: 357 10*3/uL (ref 150–400)
RBC: 3.38 MIL/uL — ABNORMAL LOW (ref 4.22–5.81)
RDW: 17.3 % — ABNORMAL HIGH (ref 11.5–15.5)
WBC: 11.9 10*3/uL — ABNORMAL HIGH (ref 4.0–10.5)
nRBC: 0 % (ref 0.0–0.2)

## 2019-10-23 LAB — PHOSPHORUS: Phosphorus: 2.6 mg/dL (ref 2.5–4.6)

## 2019-10-23 LAB — HEPARIN LEVEL (UNFRACTIONATED): Heparin Unfractionated: 0.32 IU/mL (ref 0.30–0.70)

## 2019-10-23 LAB — MAGNESIUM: Magnesium: 2 mg/dL (ref 1.7–2.4)

## 2019-10-23 MED ORDER — TRAVASOL 10 % IV SOLN
INTRAVENOUS | Status: AC
  Filled 2019-10-23: qty 1208.4

## 2019-10-23 MED ORDER — LORAZEPAM 2 MG/ML IJ SOLN
0.5000 mg | Freq: Two times a day (BID) | INTRAMUSCULAR | Status: DC | PRN
  Administered 2019-10-25 – 2019-11-12 (×26): 0.5 mg via INTRAVENOUS
  Filled 2019-10-23 (×29): qty 1

## 2019-10-23 NOTE — Progress Notes (Signed)
PHARMACY - TOTAL PARENTERAL NUTRITION CONSULT NOTE   Indication: Prolonged ileus  Patient Measurements: Height: 5\' 11"  (180.3 cm) (per daughter) Weight: 92.1 kg (203 lb 0.7 oz) IBW/kg (Calculated) : 75.3   Body mass index is 28.32 kg/m. Usual Weight: 90.7 kg   Assessment:  - Pharmacy is consulted to start TPN in 86 yo male with prolonged ileus. Pt underwent exploratory laparotomy on 6/14 which revealed perforated duodenal ulcer.   Glucose / Insulin: hx of DM - on metformin PTA.  - CBGs near goal with insulin in TPN, rSSI q4h (range 121-153; goal 100-150) - 23 units of SS insulin required yesterday - Levemir 10 units BID per CCM started 6/18  - Regular insulin to TPN, increased to 45 units 6/20 Electrolytes: K (target >/= 4.0) is 4.2. Magnesium 2.0 (target >/= 2.0); others wnl including corrected calcium 9.2 Renal: SCr now WNL,  BUN elevated; bicarb WNL, Cl incr slightly (UOP increased after receiving Lasix 6/16-6/17) LFTs / TGs: LFTs remain WNL, Tbili improved to WNL after procedure; TG are now WNL after stopping propofol. Previously elevated level likely spurious  Prealbumin / albumin: prealbumin <5 (6/16), 9.0 (6/21); albumin 1.8 Intake / Output; MIVF: -760 yesterday; on NS [facility]; NG output not charted  GI Imaging:  - 6/21 Upper GI series: small leak from superior margin of duodenal bulb. - 6/23 CT abdomen: 4.3 cm air-fluid collection in the right upper quadrant of the abdomen between the duodenal bulb and gallbladder, not containing enteric contrast. No residual leak from the duodenal bulb identified. Surgeries / Procedures:  - 6/14 Exploratory laparotomy which revealed perforated duodenal ulcer - 6/24 RUQ drain placed > 10 ml > cultured - 6/25 removal R abd JP drain   Central access:  PICC Line 6/15 TPN start date: 6/15  Nutritional Goals:   (per RD recommendation on 6/15): Kcal:  2200-2400 Protein:  115-125 grams Fluid:  >2L/d Goal TPN rate is 95 mL/hr (provides  121 g of protein and 2262 kcals per day)  Current Nutrition:  NPO   Plan:    Continue TPN at 95 mL/hr at 1800, goal rate   Electrolytes in TPN: 80 mEq/L of Na, 80 mEq/L of K (max in TPN per [facility] policy) , 4 mEq/L of Ca, incr to 12 mEq/L of Magnesium, and 10 mmol/L of Phos. Cl:Ac ratio max Ac  Standard MVI and trace elements to TPN  Continue SSI resistant  q4h SSI, and monitor Levemir and adjust as needed   Increase to 60 units of regular insulin/day to TPN   Continue MIVF to KVO mL/hr at 1800  Monitor TPN labs on Mon/Thurs;  BMET, Mag, Phos in am 6/26  Minda Ditto PharmD 10/23/2019, 9:48 AM

## 2019-10-23 NOTE — Progress Notes (Addendum)
NAME:  Matthew Schwartz, MRN:  [id number], DOB:  [birth year], LOS: 56 ADMISSION DATE:  10/12/2019, CONSULTATION DATE:  10/12/2019 REFERRING MD:  Dr. [name], Surgery, CHIEF COMPLAINT:  Abdominal pain  Brief History   86 y/o male, former smoker, presented with nausea/vomiting, abdominal pain and constipation x 4 to 5 days.  Seen twice prior to current admit for the same with imaging studies unremarkable for cause.  On return 6/14, seen by GI and found to have concern for acute abdomen and taken for exploratory laparotomy.  Found to have perforated duodenal ulcer s/p omental patch repair.  Remained on vent post op.  PCCM consulted for critical care management.  Past Medical History  Prostate cancer, HTN, GERD, HLD, DM  Significant Hospital Events   6/14 Admit, exploratory laparotomy, omental patch repair of duodenal ulcer 6/16 Off vasopressors, less drainage from abd drain / clearing  6/17 extubated, weak cough, added high flow oxygen 6/18 start heparin gtt 6/20 d/c amiodarone 6/23 new fluid collection on CT abd > Drained by IR 6/24 To [place] 6/25 PCCM sign off.   Consults:  Gastroenterology General surgery Cardiology  Procedures:  Rt IJ CVL 6/14 >> 6/15 Rt radial a line 6/14 >> 6/17 ETT 6/14 >> 6/17 LUE PICC 6/16 >>     Significant Diagnostic Tests:  CT angio abd/pelvis 6/13 >> mild atherosclerosis, no significant arterial stenosis of mesenteric vasculature, no findings of bowel ischemia, colonic diverticulosis w/o diverticulitis, b/l renal cysts, 12 mm lesion upper pole Rt kidney ECHO 6/15 >> poor image quality, appears to be regional wall motion variability but confident wall motion analysis is impossible even after contrast administration, LVEF ~ 73-53%, Grade I diastolic dysfunction, RV systolic function is moderately reduced, RV is mildly enlarged 6/19 venous doppler LUE >> acute deep vein thrombosis involving the left axillary vein, surrounding the PICC line. Findings consistent with  acute superficial vein thrombosis involving the left cephalic vein. UGI 6/21 >> small leak from duodenum CT ABD/Pelvis 6/23 >> interval midline abdominal incision with incomplete skin surgical closure, probable small hematomas within the anterior abdominal wall and the omentum. Nonspecific 4.3 cm air-fluid collection in the RUQ of the abdomen between the duodenal bulb and gallbladder, not containing enteric contrast. No residual leak from the duodenal bulb identified.  New small bilateral pleural effusions with dependent L>R lower lobe opacities  Micro Data:  SARS CoV2 6/13 >> negative UC 6/14 >> less than 10k insignificant growth   Antimicrobials:  Zosyn 6/14 >>  Interim history/subjective:   Somnolent today morning. Vitals are stable. WBC count is better  Objective   Blood pressure (!) 140/55, pulse 89, temperature 99.6 F (37.6 C), temperature source Axillary, resp. rate (!) 25, height 5\' 11"  (1.803 m), weight 92.1 kg, SpO2 100 %.        Intake/Output Summary (Last 24 hours) at 10/23/2019 1000 Last data filed at 10/23/2019 0500 Gross per 24 hour  Intake 823.86 ml  Output 2358 ml  Net -1534.14 ml   Filed Weights   10/13/19 0923 10/22/19 0906 10/23/19 0500  Weight: 96.4 kg 90.4 kg 92.1 kg          Examination: Gen:      No acute distress, chrionically ill appearing HEENT:  EOMI, sclera anicteric Neck:     No masses; no thyromegaly Lungs:    Clear to auscultation bilaterally; normal respiratory effort CV:         Regular rate and rhythm; no murmurs Abd:      +  bowel sounds; soft, non-tender; no palpable masses, no distension Ext:    No edema; adequate peripheral perfusion Skin:      Warm and dry; no rash Neuro: Somnolent arousable  Labs reviewed Metabolic panel is stable.  WBC count improved to 11.9 No new imaging  Resolved Hospital Problem list   Septic shock  Assessment & Plan:   Peritonitis in setting of perforated duodenal ulcer s/p omental patch repair. RUQ  Fluid Collection s/p IR drain Sepsis present on admission, unknown organism - Continue zosyn - follow leukocytosis  - PPI BID  -TPN per CCS  Acute respiratory failure with hypoxia in setting of sepsis and peritonitis. Extubated 6/17.  Slow improvement.  -continue pulmonary hygiene - mobilize as able, IS -follow intermittent CXR    NSVT Acute Systolic CHF in setting of Sepsis likely from Demand Ischemia. Hx of CAD, HTN, HLD. -heparin gtt per pharmacy, continue lower limit therapeutic  -SBP goal <140 -appreciate Cardiology > LHC at some point -continue clonidine, IV lopressor, NTG paste -hole home PO regimen - norvasc, toprol, imdur, altace, ASA, lipitor   LUE DVT  Cephalic vein, axillary vein involvement  -continue PICC, treat through with IV heparin as must have access for TPN   DM type II -SSI -continue levemir 10 units BID  -hold home metformin   Anemia of critical illness and chronic disease -trend CBC  -transfuse for Hgb <7% or active bleeding   Malnutrition in the setting of critical illness TPN  Goals of care Discussed with daughter at bedside.  Patient remains significantly deconditioned and frail.  Although he is stable for now he has a long road to recovery and will likely not return to baseline independent status.  He will likely either need nursing home or some kind of long-term care.  It is possible that he may suffer setback and ended back in the ICU with new infection or complications of hospital stay.  Not sure if the family grasps his prognosis and status.  I am consulting palliative care so they can start these conversations sooner than later and establish a relationship.  PCCM will be available as needed during this hospitalization.  Please call with questions.  Best practice:  Diet: TPN DVT prophylaxis: heparin gtt GI prophylaxis: Protonix Mobility: Bed rest / mobilize tolerated  Code Status: full code Disposition: SDU Communication: Daughter  updated at bedside 6/25 on plan of care.   Signature:   Marshell Garfinkel MD [facility] Please see [email or web address] for pager details.  10/23/2019, 10:31 AM

## 2019-10-23 NOTE — Progress Notes (Signed)
ANTICOAGULATION CONSULT NOTE - Follow Up Consult  Pharmacy Consult for Heparin Indication: chest pain/ACS  Allergies  Allergen Reactions  . Losartan Potassium Other (See Comments)    Unknown reaction that happened 15 years ago   Patient Measurements: Height: 5\' 11"  (180.3 cm) (per daughter) Weight: 92.1 kg (203 lb 0.7 oz) IBW/kg (Calculated) : 75.3 Heparin Dosing Weight: 94 kg  Vital Signs: Temp: 99.6 F (37.6 C) (06/25 0800) Temp Source: Axillary (06/25 0800) BP: 140/55 (06/25 0757) Pulse Rate: 89 (06/25 0757)  Labs: Recent Labs    10/21/19 0645 10/21/19 0645 10/21/19 1737 10/22/19 0410 10/23/19 0500  HGB 9.2*   < >  --  8.2* 8.9*  HCT 28.3*  --   --  25.5* 27.6*  PLT 300  --   --  329 357  HEPARINUNFRC 0.27*   < > 0.36 0.35 0.32  CREATININE 0.82  --   --  0.95 0.86   < > = values in this interval not displayed.   Estimated Creatinine Clearance: 74.2 mL/min (by C-G formula based on SCr of 0.86 mg/dL).  Medications:  Infusions:  . sodium chloride    . sodium chloride Stopped (10/20/19 2021)  . heparin 1,200 Units/hr (10/22/19 1758)  . piperacillin-tazobactam (ZOSYN)  IV Stopped (10/23/19 1245)  . TPN ADULT (ION) 95 mL/hr at 10/22/19 1808   Assessment: 86 yo male with perforated duodenal ulcer s/p omental patch repair.  Pharmacy consulted to dose IV heparin for NSTEMI as well as for acute deep vein thrombosis involving the left axillary vein, surrounding the PIC line. Findings consistent with acute superficial vein thrombosis involving the left cephalic vein.  Today, 10/23/2019   Heparin level therapeutic- 0.32 on 1200 units/hr   Hgb 8.9, Plts wnl  Incisional and rectal bleeding noted 6/21, none further noted since  Goal of Therapy:  Heparin level 0.3-0.7 units/ml Monitor platelets by anticoagulation protocol: Yes   Plan:  Continue hparin drip at 1200 units/hr Daily heparin level and CBC Continue to monitor for signs/symptoms of bleeding  Minda Ditto PharmD 10/23/2019, 9:28 AM

## 2019-10-23 NOTE — Progress Notes (Signed)
11 Days Post-Op  Subjective: CC: Patient denies any pain or nausea but is very fatigued. IR drained collection yesterday; purulent fluid in bulb today.   Objective: Vital signs in last 24 hours: Temp:  [98.9 F (37.2 C)-100.3 F (37.9 C)] 99.6 F (37.6 C) (06/25 0800) Pulse Rate:  [79-97] 89 (06/25 0757) Resp:  [18-36] 25 (06/25 0757) BP: (116-169)/(43-65) 140/55 (06/25 0757) SpO2:  [95 %-100 %] 100 % (06/25 0757) Weight:  [90.4 kg-92.1 kg] 92.1 kg (06/25 0500) Last BM Date: 10/20/19  Intake/Output from previous day: 06/24 0701 - 06/25 0700 In: 962.4 [I.V.:817.6; IV Piggyback:144.8] Out: 2358 [Urine:2250; Emesis/NG output:100; Drains:8] Intake/Output this shift: No intake/output data recorded.  PE: Gen: Awake and alert, NAD Lungs: Clear to upper lung fields. Coarse at the bases. Strong cough that is productive.  Abd: Soft, nontender, ND, JP SS; IR JP with small amount of purulent drainage in bulb - strawberry milk color. Midline wound with healthy granulation tissue at the base Ext: No edema   Lab Results:  Recent Labs    10/22/19 0410 10/23/19 0500  WBC 15.5* 11.9*  HGB 8.2* 8.9*  HCT 25.5* 27.6*  PLT 329 357   BMET Recent Labs    10/22/19 0410 10/23/19 0500  NA 139 140  K 4.2 4.2  CL 109 114*  CO2 24 23  GLUCOSE 166* 136*  BUN 35* 31*  CREATININE 0.95 0.86  CALCIUM 7.8* 7.1*   PT/INR No results for input(s): LABPROT, INR in the last 72 hours. CMP     Component Value Date/Time   NA 140 10/23/2019 0500   NA 137 11/02/2016 1509   K 4.2 10/23/2019 0500   K 4.4 11/02/2016 1509   CL 114 (H) 10/23/2019 0500   CO2 23 10/23/2019 0500   CO2 27 11/02/2016 1509   GLUCOSE 136 (H) 10/23/2019 0500   GLUCOSE 153 (H) 11/02/2016 1509   BUN 31 (H) 10/23/2019 0500   BUN 16.7 11/02/2016 1509   CREATININE 0.86 10/23/2019 0500   CREATININE 0.9 11/02/2016 1509   CALCIUM 7.1 (L) 10/23/2019 0500   CALCIUM 9.1 11/02/2016 1509   PROT 5.6 (L) 10/23/2019 0500    PROT 7.0 11/02/2016 1509   ALBUMIN 1.6 (L) 10/23/2019 0500   ALBUMIN 3.6 11/02/2016 1509   AST 31 10/23/2019 0500   AST 15 11/02/2016 1509   ALT 42 10/23/2019 0500   ALT 12 11/02/2016 1509   ALKPHOS 61 10/23/2019 0500   ALKPHOS 58 11/02/2016 1509   BILITOT 0.8 10/23/2019 0500   BILITOT 0.71 11/02/2016 1509   GFRNONAA >60 10/23/2019 0500   GFRAA >60 10/23/2019 0500   Lipase     Component Value Date/Time   LIPASE 44 10/12/2019 0320       Studies/Results: DG Abd 1 View  Result Date: 10/21/2019 CLINICAL DATA:  Postop repair perforated duodenal ulcer. EXAM: ABDOMEN - 1 VIEW COMPARISON:  Upper GI series 10/19/2019.  CT 10/11/2019. FINDINGS: 1108 hours. There is a small amount of residual contrast within the colon. No extraluminal contrast or significant bowel distention identified. Surgical drain extends into the left upper quadrant of the abdomen. There is a nasogastric tube in the mid stomach. IMPRESSION: No acute findings. No significant bowel distension or extraluminal contrast identified. Electronically Signed   By: Richardean Sale M.D.   On: 10/21/2019 13:41   CT ABDOMEN PELVIS W CONTRAST  Result Date: 10/21/2019 CLINICAL DATA:  Perforated gastroduodenal ulcer 8 days ago with surgical repair. Persistent small leak on  postoperative upper GI series. EXAM: CT ABDOMEN AND PELVIS WITH CONTRAST TECHNIQUE: Multidetector CT imaging of the abdomen and pelvis was performed using the standard protocol following bolus administration of intravenous contrast. CONTRAST:  168mL OMNIPAQUE IOHEXOL 300 MG/ML SOLN, 133mL OMNIPAQUE IOHEXOL 300 MG/ML SOLN COMPARISON:  Abdominopelvic CT 10/11/2019. Upper GI series 10/19/2019. FINDINGS: Lower chest: New small bilateral pleural effusions with dependent left greater than right lower lobe airspace opacities, probably atelectasis. Central venous catheter extends to the superior cavoatrial junction. There is atherosclerosis the aorta and coronary arteries.  Hepatobiliary: The liver is normal in density without suspicious focal abnormality. The gallbladder is incompletely distended. Stable mild extrahepatic biliary dilatation. Pancreas: Stable mild proximal pancreatic ductal dilatation. No focal pancreatic abnormality or surrounding fluid collection. Spleen: Normal in size without focal abnormality. Adrenals/Urinary Tract: Both adrenal glands appear normal. Grossly stable low-density bilateral renal lesions, likely cysts. No hydronephrosis or ureteral calculus. A small amount of gas in the bladder lumen is likely iatrogenic. Stomach/Bowel: Enteric contrast was administered. No persistent enteric contrast leak from the duodenal bulb identified. There are mild surrounding inflammatory changes as well as an ill-defined air-fluid collection medial to the gallbladder, measuring 4.3 x 3.9 cm on image 26/2. There is no contrast within this collection. The small bowel, colon and appendix demonstrate no significant distension, wall thickening or surrounding inflammation. There are mild diverticular changes within the sigmoid colon. Vascular/Lymphatic: There are no enlarged abdominal or pelvic lymph nodes. Scattered small retroperitoneal lymph nodes are stable. Aortic and branch vessel atherosclerosis without acute vascular findings. The portal, superior mesenteric and splenic veins are patent. Reproductive: Stable postsurgical changes in the prostate gland. Other: Interval midline abdominal incision with incomplete skin surgical closure. There is a probable hematoma within the anterior abdominal wall superiorly with a fluid-fluid level, measuring 3.0 x 3.6 cm on image 43/2. There are smaller components more inferiorly within the anterior abdominal wall as well as the omentum in the right upper quadrant. As above, there is an air-fluid collection medial to the gallbladder measuring up to 4.3 cm in diameter, not containing contrast. Right-sided abdominal surgical drain extends  into the anterior left upper quadrant of the abdomen. No significant fluid collections along the drain. No free air or ascites. Musculoskeletal: No acute or significant osseous findings. IMPRESSION: 1. Interval midline abdominal incision with incomplete skin surgical closure. There are probable small hematomas within the anterior abdominal wall and the omentum. 2. Nonspecific 4.3 cm air-fluid collection in the right upper quadrant of the abdomen between the duodenal bulb and gallbladder, not containing enteric contrast. No residual leak from the duodenal bulb identified. 3. New small bilateral pleural effusions with dependent left greater than right lower lobe airspace opacities, probably atelectasis. 4. Aortic Atherosclerosis (ICD10-I70.0). Electronically Signed   By: Richardean Sale M.D.   On: 10/21/2019 13:40   CT IMAGE GUIDED FLUID DRAIN BY CATHETER  Result Date: 10/22/2019 INDICATION: 86 year old male with right upper quadrant fluid collection, possible abscess, referred for drainage EXAM: CT-GUIDED DRAIN PLACEMENT MEDICATIONS: The patient is currently admitted to the hospital and receiving intravenous antibiotics. The antibiotics were administered within an appropriate time frame prior to the initiation of the procedure. ANESTHESIA/SEDATION: Fentanyl 1.0 mcg IV; Versed 25 mg IV Moderate Sedation Time:  16 minutes The patient was continuously monitored during the procedure by the interventional radiology nurse under my direct supervision. COMPLICATIONS: None PROCEDURE: Informed written consent was obtained from the patient after a thorough discussion of the procedural risks, benefits and alternatives. All questions were  addressed. Maximal Sterile Barrier Technique was utilized including caps, mask, sterile gowns, sterile gloves, sterile drape, hand hygiene and skin antiseptic. A timeout was performed prior to the initiation of the procedure. Patient positioned supine position on the CT gantry table. Scout  CT was acquired. Patient is prepped and draped in the usual sterile fashion. 1% lidocaine was used for local anesthesia. Using modified Seldinger technique, 10 French drain was placed into the right upper quadrant fluid collection, adjacent to the gallbladder in the sub a Paddock space. Proximally 10 cc purulent fluid aspirated and sent for culture. Drain was sutured in position attached to bulb suction. Final CT was acquired. Patient tolerated the procedure well and remained hemodynamically stable throughout. No complications were encountered and no significant blood loss. IMPRESSION: Status post CT-guided drainage of right upper quadrant fluid collection. Signed, Dulcy Fanny. Dellia Nims, RPVI [facility] Electronically Signed   By: Corrie Mckusick D.O.   On: 10/22/2019 13:43    Anti-infectives: Anti-infectives (From admission, onward)   Start     Dose/Rate Route Frequency Ordered Stop   10/12/19 2000  piperacillin-tazobactam (ZOSYN) IVPB 3.375 g     Discontinue     3.375 g 12.5 mL/hr over 240 Minutes Intravenous Every 8 hours 10/12/19 1911     10/12/19 1708  ceFAZolin (ANCEF) 2-4 GM/100ML-% IVPB       Note to Pharmacy: Bennye Alm   : cabinet override      10/12/19 1708 10/12/19 1849       Assessment/Plan Remote hx of prostate cancer s/p radiation HTN - off pressors HLD GERD - Protonix DM2 VT - on amio drip Hyperglycemia - SSI Hypokalemia  LUE DVT - on heparin gtt Severe Protein calorie malnutrition - Pre-alb < 5. TPN ABL Anemia - hgb 8.2, monitor.   Perforated Duodenal Ulcer S/p Exploratory laparotomy, omental patch repair of duodenal ulcer - Dr. [name] - 10/12/2019 - POD #11 - Maintain NGT. UGI had shown small leak from repair. No bile from JP at this poin purely serosanguinous. We will remove this today. IR JP has purulent drainage but no apparent bile. Could be retained gastrograffin. Monitor. Likely repeat UGI next week.  All that said, at this point he is significantly deconditioned and very frail. I have gently engaged family regarding all of this. Long-term he and his family may benefit from goals of care discussions -TPN - CT 6/23 showed 4.3 cm air-fluid collection int he RUQ not containing enteric contrast. Patient and family agreeable to IR drain - We will follow along with you  FEN -NPO, NGT, IVF, TPN VTE - SCDs, heparin gtt  ID -Zosyn 6/14 >> day10. WBC down to 11.9. Can D/C abx once WBC normalizes since source control has been achieved. Foley - per CCM Follow-Up - as per primary  Plan:Cont NPO, NGT. JP & IR drian.   LOS: 11 days   Nadeen Landau, M.D. [facility] Use [email or web address] to contact on call provider

## 2019-10-23 NOTE — Progress Notes (Signed)
Matthew Schwartz  Matthew Schwartz PPJ:[id number] DOB: 05-19-1932 DOA: 10/12/2019 PCP: [name], MD  HPI/Recap of past 24 hours: HPI from PCCM 86 y/o male, former smoker, presented with nausea/vomiting, abdominal pain and constipation x 4 to 5 days.  Seen twice prior to current admit for the same with imaging studies unremarkable for cause.  On return 6/14, seen by GI and found to have concern for acute abdomen and taken for exploratory laparotomy.  Found to have perforated duodenal ulcer s/p omental patch repair.  Remained on vent post op.  PCCM consulted for critical care management.  Patient was subsequently extubated on 6/17 with very slow improvement.  [facility] hospital[facility] assumed care on 10/22/2019, PCCM still following.    Today, patient noted to be pretty drowsy, had Ativan around 1 AM this morning, denied any pain.  Noted to have participated with PT by sitting at the edge of the bed for about 8 minutes.     Assessment/Plan: Active Problems:   HTN (hypertension)   Malignant neoplasm of prostate (HCC)   Hyperlipidemia   Obstipation   HLD (hyperlipidemia)   Diabetes mellitus type 2, uncontrolled, with complications (HCC)   Perforated bowel (HCC)   Perforated duodenal ulcer ([place])   Acute respiratory insufficiency   Elevated troponin level   Acute respiratory failure with hypoxemia (HCC)   PVCs (premature ventricular contractions)   Bilateral atelectasis   Acute abdominal pain  Septic shock/peritonitis in the setting of perforated duodenal ulcer status post omental patch repair on 6/14 Shock resolved, off pressors Currently afebrile, with resolving leukocytosis Repeat CT abdomen/pelvis done on 10/21/2019 showed some right upper quadrant fluid collection status post drain placement on 10/22/2019 by IR, removed about 10 cc of grayish purulent fluid Follow-up culture, gram stain growing moderate yeast Continue IV Zosyn Continue n.p.o. status, TPN, NG tube per general surgery Monitor  closely  Acute respiratory failure with hypoxia in the setting of septic shock Extubated on 6/17, still unable to vocalize, overall weak Currently on 2 L of O2, saturating well May need SLP to evaluate for speech once okay to tolerate by mouth, NG tube is out Continue pulmonary hygiene, incentive spirometry Supplemental oxygen as needed PCCM available prn  Acute systolic HF NSTEMI/NSVT History of CAD, hypertension, hyperlipidemia Currently chest pain-free Cardiology consulted, plan for LHC once patient is very stable/able to lie flat without dyspnea/hypoxia, tolerate orally Continue Heparin gtt. Continue clonidine patch, IV Lopressor, NGT placed Hold home Norvasc, Toprol, Imdur, ASA, Lipitor  Left upper extremity DVT Cephalic/axillary vein involvement Continue IV Heparin  Diabetes mellitus type 2 A1c done on 10/12/2019 was 6.6 SSI, detemir, Accu-Cheks, hypoglycemic protocol  Anemia of critical illness/post op Daily CBC  Goals of care discussion Patient significantly deconditioned, with poor prognosis, unsure if patient will ever return back to his baseline PCCM consulted palliative to establish goals of care       Malnutrition Type:  Nutrition Problem: Inadequate oral intake Etiology: inability to eat   Malnutrition Characteristics:  Signs/Symptoms: NPO status   Nutrition Interventions:  Interventions: TPN    Estimated body mass index is 28.32 kg/m as calculated from the following:   Height as of this encounter: 5\' 11"  (1.803 m).   Weight as of this encounter: 92.1 kg.     Code Status: Full  Family Communication: Discussed with both daughters on 10/22/19  Disposition Plan: Status is: Inpatient  Remains inpatient appropriate because:Inpatient level of care appropriate due to severity of illness   Dispo: The patient is from:  Home              Anticipated d/c is to: SNF Vs CIR              Anticipated d/c date is: > 3 days              Patient  currently is not medically stable to d/c.   Consultants:  General surgery  PCCM  Cardiology  IR   Procedures:  Omental patch repair on 6/14  Antimicrobials:  Zosyn  DVT prophylaxis: Heparin drip   Objective: Vitals:   10/23/19 1300 10/23/19 1400 10/23/19 1500 10/23/19 1600  BP: 131/72 126/70 (!) 140/45 (!) 138/52  Pulse:   90 89  Resp: (!) 24 (!) 26    Temp:    97.6 F (36.4 C)  TempSrc:    Oral  SpO2:   93% 100%  Weight:      Height:        Intake/Output Summary (Last 24 hours) at 10/23/2019 1626 Last data filed at 10/23/2019 1600 Gross per 24 hour  Intake 2456.47 ml  Output 2350 ml  Net 106.47 ml   Filed Weights   10/13/19 0923 10/22/19 0906 10/23/19 0500  Weight: 96.4 kg 90.4 kg 92.1 kg    Exam:  General: NAD, NGT in place, chronically ill-appearing, deconditioned, unable to vocalize  Cardiovascular: S1, S2 present  Respiratory:  Diminished breath sounds bilaterally  Abdomen: Soft, nontender, nondistended, bowel sounds present, dressing C/D/I  Musculoskeletal: No bilateral pedal edema noted  Skin: Normal  Psychiatry: Fair mood   Data Reviewed: CBC: Recent Labs  Lab 10/19/19 0248 10/20/19 0547 10/21/19 0645 10/22/19 0410 10/23/19 0500  WBC 16.7* 20.6* 17.7* 15.5* 11.9*  NEUTROABS 12.9* 17.4* 15.0* 12.9* 9.5*  HGB 9.7* 9.3* 9.2* 8.2* 8.9*  HCT 29.3* 28.6* 28.3* 25.5* 27.6*  MCV 77.7* 80.6 80.2 81.2 81.7  PLT 227 257 300 329 324   Basic Metabolic Panel: Recent Labs  Lab 10/19/19 0248 10/20/19 0547 10/21/19 0645 10/22/19 0410 10/23/19 0500  NA 139 138 139 139 140  K 4.0 4.3 4.0 4.2 4.2  CL 103 106 107 109 114*  CO2 27 24 25 24 23   GLUCOSE 161* 165* 148* 166* 136*  BUN 34* 33* 32* 35* 31*  CREATININE 0.82 0.79 0.82 0.95 0.86  CALCIUM 7.8* 7.7* 7.9* 7.8* 7.1*  MG 2.3 2.2 2.3 2.4 2.0  PHOS 2.5 2.9 2.7 2.9 2.6   GFR: Estimated Creatinine Clearance: 74.2 mL/min (by C-G formula based on SCr of 0.86 mg/dL). Liver Function  Tests: Recent Labs  Lab 10/17/19 0332 10/19/19 0248 10/20/19 0547 10/22/19 0410 10/23/19 0500  AST 29 25 25 31 31   ALT 35 38 39 46* 42  ALKPHOS 79 73 71 71 61  BILITOT 1.0 0.9 0.9 1.0 0.8  PROT 5.4* 5.6* 5.6* 6.0* 5.6*  ALBUMIN 2.0* 2.0* 1.8* 1.9* 1.6*   No results for input(s): LIPASE, AMYLASE in the last 168 hours. No results for input(s): AMMONIA in the last 168 hours. Coagulation Profile: No results for input(s): INR, PROTIME in the last 168 hours. Cardiac Enzymes: No results for input(s): CKTOTAL, CKMB, CKMBINDEX, TROPONINI in the last 168 hours. BNP (last 3 results) No results for input(s): PROBNP in the last 8760 hours. HbA1C: No results for input(s): HGBA1C in the last 72 hours. CBG: Recent Labs  Lab 10/23/19 0014 10/23/19 0446 10/23/19 0742 10/23/19 1215 10/23/19 1559  GLUCAP 121* 154* 149* 129* 130*   Lipid Profile: No results for input(s):  CHOL, HDL, LDLCALC, TRIG, CHOLHDL, LDLDIRECT in the last 72 hours. Thyroid Function Tests: No results for input(s): TSH, T4TOTAL, FREET4, T3FREE, THYROIDAB in the last 72 hours. Anemia Panel: No results for input(s): VITAMINB12, FOLATE, FERRITIN, TIBC, IRON, RETICCTPCT in the last 72 hours. Urine analysis:    Component Value Date/Time   COLORURINE YELLOW 10/11/2019 1202   APPEARANCEUR CLEAR 10/11/2019 1202   LABSPEC 1.024 10/11/2019 1202   PHURINE 6.0 10/11/2019 1202   GLUCOSEU NEGATIVE 10/11/2019 1202   HGBUR NEGATIVE 10/11/2019 1202   BILIRUBINUR NEGATIVE 10/11/2019 1202   KETONESUR NEGATIVE 10/11/2019 1202   PROTEINUR >=300 (A) 10/11/2019 1202   UROBILINOGEN 0.2 07/30/2007 1100   NITRITE NEGATIVE 10/11/2019 1202   LEUKOCYTESUR NEGATIVE 10/11/2019 1202   Sepsis Labs: @LABRCNTIP (procalcitonin:4,lacticidven:4)  ) Recent Results (from the past 240 hour(s))  Aerobic/Anaerobic Culture (surgical/deep wound)     Status: None (Preliminary result)   Collection Time: 10/22/19 12:57 PM   Specimen: Abdomen; Abscess    Result Value Ref Range Status   Specimen Description   Final    ABDOMEN Performed at [facility] [address], [place], [place] [address]    Special Requests   Final    NONE Performed at [facility], [place] [address], [place], [place] [address]    Gram Stain   Final    ABUNDANT WBC PRESENT, PREDOMINANTLY PMN MODERATE YEAST    Culture   Final    MODERATE YEAST CULTURE REINCUBATED FOR BETTER GROWTH Performed at [facility], [place] [address], [place], [place] [address]    Report Status PENDING  Incomplete      Studies: No results found.  Scheduled Meds: . Chlorhexidine Gluconate Cloth  6 each Topical Daily  . cloNIDine  0.2 mg Transdermal Weekly  . insulin aspart  0-20 Units Subcutaneous Q4H  . insulin detemir  10 Units Subcutaneous BID  . lidocaine  1 patch Transdermal Q24H  . lidocaine  1 patch Transdermal Q24H  . mouth rinse  15 mL Mouth Rinse BID  . metoprolol tartrate  5 mg Intravenous Q4H  . nitroGLYCERIN  1 inch Topical Q6H  . pantoprazole (PROTONIX) IV  40 mg Intravenous Q12H  . sodium chloride flush  10-40 mL Intracatheter Q12H    Continuous Infusions: . sodium chloride    . sodium chloride Stopped (10/20/19 2021)  . heparin 1,200 Units/hr (10/23/19 1600)  . piperacillin-tazobactam (ZOSYN)  IV Stopped (10/23/19 1645)  . TPN ADULT (ION) 95 mL/hr at 10/23/19 1600  . TPN ADULT (ION)       LOS: 11 days     Alma Friendly, MD [facility]  If 7PM-7AM, please contact night-coverage [email or web address] 10/23/2019, 4:26 PM

## 2019-10-23 NOTE — Progress Notes (Signed)
Progress Note  Patient Name: Matthew Schwartz Date of Encounter: 10/23/2019  [facility] Cardiologist: Candee Furbish, MD   Subjective   Tired.  Denies pain. Unsure about pursuing coronary evaluation.   Inpatient Medications    Scheduled Meds: . Chlorhexidine Gluconate Cloth  6 each Topical Daily  . cloNIDine  0.2 mg Transdermal Weekly  . insulin aspart  0-20 Units Subcutaneous Q4H  . insulin detemir  10 Units Subcutaneous BID  . lidocaine  1 patch Transdermal Q24H  . lidocaine  1 patch Transdermal Q24H  . mouth rinse  15 mL Mouth Rinse BID  . metoprolol tartrate  5 mg Intravenous Q4H  . nitroGLYCERIN  1 inch Topical Q6H  . pantoprazole (PROTONIX) IV  40 mg Intravenous Q12H  . sodium chloride flush  10-40 mL Intracatheter Q12H   Continuous Infusions: . sodium chloride    . sodium chloride Stopped (10/20/19 2021)  . heparin 1,200 Units/hr (10/22/19 1758)  . piperacillin-tazobactam (ZOSYN)  IV Stopped (10/23/19 1610)  . TPN ADULT (ION) 95 mL/hr at 10/22/19 1808   PRN Meds: sodium chloride, acetaminophen **OR** acetaminophen, fentaNYL (SUBLIMAZE) injection, hydrALAZINE, lip balm, LORazepam, ondansetron (ZOFRAN) IV, sodium chloride flush   Vital Signs    Vitals:   10/23/19 0600 10/23/19 0700 10/23/19 0757 10/23/19 0800  BP: (!) 138/58 (!) 137/43 (!) 140/55   Pulse: 84 85 89   Resp: (!) 25 (!) 26 (!) 25   Temp:    99.6 F (37.6 C)  TempSrc:    Axillary  SpO2: 100% 100% 100%   Weight:      Height:        Intake/Output Summary (Last 24 hours) at 10/23/2019 0855 Last data filed at 10/23/2019 0500 Gross per 24 hour  Intake 823.86 ml  Output 2358 ml  Net -1534.14 ml   Last 3 Weights 10/23/2019 10/22/2019 10/13/2019  Weight (lbs) 203 lb 0.7 oz 199 lb 4.7 oz 212 lb 8.4 oz  Weight (kg) 92.1 kg 90.4 kg 96.4 kg      Telemetry    Sinus rhythm.  Multifocal PVCs - Personally Reviewed  ECG    n/a - Personally Reviewed  Physical Exam   VS:  BP (!) 140/55 (BP Location:  Right Arm)   Pulse 89   Temp 99.6 F (37.6 C) (Axillary)   Resp (!) 25   Ht 5\' 11"  (1.803 m) Comment: per daughter  Wt 92.1 kg   SpO2 100%   BMI 28.32 kg/m  , BMI Body mass index is 28.32 kg/m. GENERAL: Critically ill-appearing.  Weak HEENT: Pupils equal round and reactive, fundi not visualized, oral mucosa unremarkable.  NG tube in place NECK:  No jugular venous distention, waveform within normal limits, carotid upstroke brisk and symmetric, no bruits LUNGS: Diffuse rhonchi anteriorly.  No crackles or wheezes. HEART:  RRR.  PMI not displaced or sustained,S1 and S2 within normal limits, no S3, no S4, no clicks, no rubs, no murmurs ABD:  positive bowel sounds normal in frequency in pitch, no bruits EXT:  2 plus pulses throughout, no edema, no cyanosis no clubbing SKIN:  No rashes no nodules NEURO:  Cranial nerves II through XII grossly intact, motor grossly intact throughout PSYCH:  Cognitively intact, oriented to person place and time   Labs    High Sensitivity Troponin:   Recent Labs  Lab 10/13/19 1014 10/15/19 2124 10/15/19 2314 10/16/19 0237 10/16/19 0500  TROPONINIHS 55* 659* 1,022* 871* 776*      Chemistry Recent Labs  Lab 10/20/19 0547 10/20/19 0547 10/21/19 0645 10/22/19 0410 10/23/19 0500  NA 138   < > 139 139 140  K 4.3   < > 4.0 4.2 4.2  CL 106   < > 107 109 114*  CO2 24   < > 25 24 23   GLUCOSE 165*   < > 148* 166* 136*  BUN 33*   < > 32* 35* 31*  CREATININE 0.79   < > 0.82 0.95 0.86  CALCIUM 7.7*   < > 7.9* 7.8* 7.1*  PROT 5.6*  --   --  6.0* 5.6*  ALBUMIN 1.8*  --   --  1.9* 1.6*  AST 25  --   --  31 31  ALT 39  --   --  46* 42  ALKPHOS 71  --   --  71 61  BILITOT 0.9  --   --  1.0 0.8  GFRNONAA >60   < > >60 >60 >60  GFRAA >60   < > >60 >60 >60  ANIONGAP 8   < > 7 6 3*   < > = values in this interval not displayed.     Hematology Recent Labs  Lab 10/21/19 0645 10/22/19 0410 10/23/19 0500  WBC 17.7* 15.5* 11.9*  RBC 3.53* 3.14* 3.38*    HGB 9.2* 8.2* 8.9*  HCT 28.3* 25.5* 27.6*  MCV 80.2 81.2 81.7  MCH 26.1 26.1 26.3  MCHC 32.5 32.2 32.2  RDW 17.2* 17.1* 17.3*  PLT 300 329 357    BNPNo results for input(s): BNP, PROBNP in the last 168 hours.   DDimer No results for input(s): DDIMER in the last 168 hours.   Radiology    DG Abd 1 View  Result Date: 10/21/2019 CLINICAL DATA:  Postop repair perforated duodenal ulcer. EXAM: ABDOMEN - 1 VIEW COMPARISON:  Upper GI series 10/19/2019.  CT 10/11/2019. FINDINGS: 1108 hours. There is a small amount of residual contrast within the colon. No extraluminal contrast or significant bowel distention identified. Surgical drain extends into the left upper quadrant of the abdomen. There is a nasogastric tube in the mid stomach. IMPRESSION: No acute findings. No significant bowel distension or extraluminal contrast identified. Electronically Signed   By: Richardean Sale M.D.   On: 10/21/2019 13:41   CT ABDOMEN PELVIS W CONTRAST  Result Date: 10/21/2019 CLINICAL DATA:  Perforated gastroduodenal ulcer 8 days ago with surgical repair. Persistent small leak on postoperative upper GI series. EXAM: CT ABDOMEN AND PELVIS WITH CONTRAST TECHNIQUE: Multidetector CT imaging of the abdomen and pelvis was performed using the standard protocol following bolus administration of intravenous contrast. CONTRAST:  128mL OMNIPAQUE IOHEXOL 300 MG/ML SOLN, 167mL OMNIPAQUE IOHEXOL 300 MG/ML SOLN COMPARISON:  Abdominopelvic CT 10/11/2019. Upper GI series 10/19/2019. FINDINGS: Lower chest: New small bilateral pleural effusions with dependent left greater than right lower lobe airspace opacities, probably atelectasis. Central venous catheter extends to the superior cavoatrial junction. There is atherosclerosis the aorta and coronary arteries. Hepatobiliary: The liver is normal in density without suspicious focal abnormality. The gallbladder is incompletely distended. Stable mild extrahepatic biliary dilatation. Pancreas:  Stable mild proximal pancreatic ductal dilatation. No focal pancreatic abnormality or surrounding fluid collection. Spleen: Normal in size without focal abnormality. Adrenals/Urinary Tract: Both adrenal glands appear normal. Grossly stable low-density bilateral renal lesions, likely cysts. No hydronephrosis or ureteral calculus. A small amount of gas in the bladder lumen is likely iatrogenic. Stomach/Bowel: Enteric contrast was administered. No persistent enteric contrast leak from the duodenal bulb  identified. There are mild surrounding inflammatory changes as well as an ill-defined air-fluid collection medial to the gallbladder, measuring 4.3 x 3.9 cm on image 26/2. There is no contrast within this collection. The small bowel, colon and appendix demonstrate no significant distension, wall thickening or surrounding inflammation. There are mild diverticular changes within the sigmoid colon. Vascular/Lymphatic: There are no enlarged abdominal or pelvic lymph nodes. Scattered small retroperitoneal lymph nodes are stable. Aortic and branch vessel atherosclerosis without acute vascular findings. The portal, superior mesenteric and splenic veins are patent. Reproductive: Stable postsurgical changes in the prostate gland. Other: Interval midline abdominal incision with incomplete skin surgical closure. There is a probable hematoma within the anterior abdominal wall superiorly with a fluid-fluid level, measuring 3.0 x 3.6 cm on image 43/2. There are smaller components more inferiorly within the anterior abdominal wall as well as the omentum in the right upper quadrant. As above, there is an air-fluid collection medial to the gallbladder measuring up to 4.3 cm in diameter, not containing contrast. Right-sided abdominal surgical drain extends into the anterior left upper quadrant of the abdomen. No significant fluid collections along the drain. No free air or ascites. Musculoskeletal: No acute or significant osseous  findings. IMPRESSION: 1. Interval midline abdominal incision with incomplete skin surgical closure. There are probable small hematomas within the anterior abdominal wall and the omentum. 2. Nonspecific 4.3 cm air-fluid collection in the right upper quadrant of the abdomen between the duodenal bulb and gallbladder, not containing enteric contrast. No residual leak from the duodenal bulb identified. 3. New small bilateral pleural effusions with dependent left greater than right lower lobe airspace opacities, probably atelectasis. 4. Aortic Atherosclerosis (ICD10-I70.0). Electronically Signed   By: Richardean Sale M.D.   On: 10/21/2019 13:40   CT IMAGE GUIDED FLUID DRAIN BY CATHETER  Result Date: 10/22/2019 INDICATION: 86 year old male with right upper quadrant fluid collection, possible abscess, referred for drainage EXAM: CT-GUIDED DRAIN PLACEMENT MEDICATIONS: The patient is currently admitted to the hospital and receiving intravenous antibiotics. The antibiotics were administered within an appropriate time frame prior to the initiation of the procedure. ANESTHESIA/SEDATION: Fentanyl 1.0 mcg IV; Versed 25 mg IV Moderate Sedation Time:  16 minutes The patient was continuously monitored during the procedure by the interventional radiology nurse under my direct supervision. COMPLICATIONS: None PROCEDURE: Informed written consent was obtained from the patient after a thorough discussion of the procedural risks, benefits and alternatives. All questions were addressed. Maximal Sterile Barrier Technique was utilized including caps, mask, sterile gowns, sterile gloves, sterile drape, hand hygiene and skin antiseptic. A timeout was performed prior to the initiation of the procedure. Patient positioned supine position on the CT gantry table. Scout CT was acquired. Patient is prepped and draped in the usual sterile fashion. 1% lidocaine was used for local anesthesia. Using modified Seldinger technique, 10 French drain was  placed into the right upper quadrant fluid collection, adjacent to the gallbladder in the sub a Paddock space. Proximally 10 cc purulent fluid aspirated and sent for culture. Drain was sutured in position attached to bulb suction. Final CT was acquired. Patient tolerated the procedure well and remained hemodynamically stable throughout. No complications were encountered and no significant blood loss. IMPRESSION: Status post CT-guided drainage of right upper quadrant fluid collection. Signed, Dulcy Fanny. Dellia Nims, RPVI [facility] Electronically Signed   By: Corrie Mckusick D.O.   On: 10/22/2019 13:43    Cardiac Studies   Limited echo 10/16/19 1. Image quality  remains technically difficult, making wall motion  evaluation challenging. There is global left ventricular hypokinesis, but  there appears to be disproportionately severe inferolateral wall  hypokinesis. Left ventricular ejection  fraction, by estimation, is 40 to 45%. The left ventricle has mildly  decreased function. The left ventricle has no regional wall motion  abnormalities. Left ventricular diastolic parameters are consistent with  Grade I diastolic dysfunction (impaired  relaxation).  2. Right ventricular systolic function is normal. The right ventricular  size is normal.  3. The mitral valve is normal in structure. No evidence of mitral valve  regurgitation. No evidence of mitral stenosis.  4. The aortic valve is normal in structure. Aortic valve regurgitation is  not visualized. No aortic stenosis is present.  5. The inferior vena cava is normal in size with greater than 50%  respiratory variability, suggesting right atrial pressure of 3 mmHg.   Comparison(s): Compared to 10/13/2019 The left ventricular function is  unchanged. The right ventricular systolic function has improved. Image  quality remains poor.   FINDINGS  Left Ventricle: Image quality remains  technically difficult, making wall  motion evaluation challenging. There is global left ventricular  hypokinesis, but there appears to be disproportionately severe  inferolateral wall hypokinesis. Left ventricular  ejection fraction, by estimation, is 40 to 45%. The left ventricle has  mildly decreased function. The left ventricle has no regional wall motion  abnormalities. The left ventricular internal cavity size was normal in  size. There is no left ventricular  hypertrophy. Normal left ventricular filling pressure.   Right Ventricle: The right ventricular size is normal. No increase in  right ventricular wall thickness. Right ventricular systolic function is  normal.   Left Atrium: Left atrial size was normal in size.   Right Atrium: Right atrial size was normal in size.   Pericardium: There is no evidence of pericardial effusion.   Mitral Valve: The mitral valve is normal in structure. Normal mobility of  the mitral valve leaflets. No evidence of mitral valve stenosis.   Tricuspid Valve: The tricuspid valve is normal in structure. Tricuspid  valve regurgitation is not demonstrated. No evidence of tricuspid  stenosis.    ECHO 10/13/19 IMPRESSIONS  1. Image quality is poor. There appears to be regional wall motion  variability, but confident wall motion analysis is impossible, even after  contrast administration. Left ventricular ejection fraction, by  estimation, is 35 to 40%. The left ventricle has  moderately decreased function. The left ventricle demonstrates regional  wall motion abnormalities. Left ventricular diastolic parameters are  consistent with Grade I diastolic dysfunction (impaired relaxation).  2. Right ventricular systolic function is moderately reduced. The right  ventricular size is mildly enlarged. Tricuspid regurgitation signal is  inadequate for assessing PA pressure.  3. The mitral valve is normal in structure. No evidence of mitral valve    regurgitation.  4. The aortic valve was not well visualized. Aortic valve regurgitation  is not visualized. No aortic stenosis is present.   Comparison(s): No prior Echocardiogram.   FINDINGS  Left Ventricle: Image quality is poor. There appears to be regional wall  motion variability, but confident wall motion analysis is impossible, even  after contrast administration. Left ventricular ejection fraction, by  estimation, is 35 to 40%. The left  ventricle has moderately decreased function. The left ventricle  demonstrates regional wall motion abnormalities. Definity contrast agent  was given IV to delineate the left ventricular endocardial borders. The  left ventricular internal cavity size was  normal in size. There is no left ventricular hypertrophy. Left ventricular  diastolic parameters are consistent with Grade I diastolic dysfunction  (impaired relaxation). Normal left ventricular filling pressure.   Right Ventricle: The right ventricular size is mildly enlarged. No  increase in right ventricular wall thickness. Right ventricular systolic  function is moderately reduced. Tricuspid regurgitation signal is  inadequate for assessing PA pressure.   Left Atrium: Left atrial size was normal in size.   Right Atrium: Right atrial size was normal in size.   Pericardium: There is no evidence of pericardial effusion.   Mitral Valve: The mitral valve is normal in structure. No evidence of  mitral valve regurgitation.   Tricuspid Valve: The tricuspid valve is not well visualized. Tricuspid  valve regurgitation is not demonstrated.   Aortic Valve: The aortic valve was not well visualized. Aortic valve  regurgitation is not visualized. No aortic stenosis is present.   Pulmonic Valve: The pulmonic valve was not well visualized. Pulmonic valve  regurgitation is not visualized.   Aorta: The aortic root is normal in size and structure.   IAS/Shunts: The interatrial septum was not  assessed.   Patient Profile   Mr. Shutes is an 50M with CAD s/p RCA PCI in 2000, hypertension, hyperlipidemia, DDM admitted with a perforated duodenal ulcer and periotonitis.  Cardiology was consulted for NSVT, intermittent left bundle branch block and anterolateral T wave inversions.  High-sensitivity troponin peaked at 1022.  Cardiac catheterization delayed by hypoxic respiratory failure and abdominal surgery.  Assessment & Plan    # NSTEMI: # NSVT: In the setting of his acute illness, he had intermittent left bundle branch block and anterolateral T wave inversions.  High-sensitivity troponin peaked at 1022.  Plans for left heart catheterization once he does not need any more intra-abdominal surgeries, respiratory status has improved, and is able to take oral medications.  Fortunately, he is not having any chest pain.  Continue heparin as he isn't taking oral meds and not getting aspirin.  Stop heparin once he can start aspirin.  He is feeling tired and not sure he wants to pursue cath.  This can be re-evaluated once stable.  Consider Lexiscan Myoview vs supportive care as patient and his daughters decide.   # Acute systolic and diastolic heart failure: #Essential hypertension: He is euvolemic.  Planning for left heart catheterization vs. Stress vs. Medical management as above once stable.  Blood pressure relatively stable on clonidine patch, IV metoprolol, nitro patch and IV hydralazine as needed.  Plan to start an oral heart failure medication regimen once able to tolerate p.o.'s or medications can go an NG tube without needing continuous suction.    # Acute hypoxic respiratory failure: Extubated 6/17.  He is very rhonchorous and weak.   Per CCM.  Atelectasis was noted on chest x-ray today.  No pulmonary edema.   Time spent: 30 minutes-Greater than 50% of this time was spent in counseling, explanation of diagnosis, planning of further management, and coordination of care.   For questions  or updates, please contact [place] Please consult [email or web address] for contact info under        Signed, Skeet Latch, MD  10/23/2019, 8:55 AM

## 2019-10-23 NOTE — Progress Notes (Signed)
Physical Therapy Treatment Patient Details Name: Matthew Schwartz MRN: [id number] DOB: Mar 28, 1933 Today's Date: 10/23/2019    History of Present Illness Pt s/p exploratory lap (10/12/19) to repair perforated duodenal ulcer and then on vent with extubation 10/14/19.  Pt with acute respiratory failure with hypoxia and acute systolic CHF in setting of sepsis and peritonitis.  Pt with hx of DM, prostate CA and bil TKR    PT Comments    The patient is very drowsy, per RN, most likely from medication at  Nighttime for anxiety..  Patient required 2 total assist to sit at bedside. Once sitting, patient  Able to sit near midline with slight left lean x 8 minutes and performed leg exercises. . Patient on RA At 100%, HR in 80's. Patient indicated abdominal pain with mobility, was premedicated. Continue PT for progressive mobility.  Follow Up Recommendations  SNF     Equipment Recommendations  None recommended by PT    Recommendations for Other Services       Precautions / Restrictions Precautions Precautions: Fall Precaution Comments: 2 drains on right , abd incision, monitor VS    Mobility  Bed Mobility   Bed Mobility: Rolling Rolling: Total assist;+2 for physical assistance;+2 for safety/equipment   Supine to sit: Total assist;+2 for physical assistance;+2 for safety/equipment Sit to supine: Total assist;+2 for physical assistance;+2 for safety/equipment   General bed mobility comments: patient lethargic and slow rto respond. Assisted to sitting on bed edgfe for ~ 8 minutes, listing to the left but held self upright  Transfers                 General transfer comment: NT  Ambulation/Gait                 Stairs             Wheelchair Mobility    Modified Rankin (Stroke Patients Only)       Balance Overall balance assessment: Needs assistance Sitting-balance support: Bilateral upper extremity supported;Feet supported Sitting balance-Leahy Scale: Poor Sitting  balance - Comments: very sleepy                                    Cognition Arousal/Alertness: Lethargic;Suspect due to medications Behavior During Therapy: Flat affect                                   General Comments: does not speak today.      Exercises General Exercises - Lower Extremity Ankle Circles/Pumps: PROM;Both;10 reps Heel Slides: AAROM;Both;10 reps;Supine    General Comments        Pertinent Vitals/Pain Faces Pain Scale: Hurts even more Pain Location: holds abdomen Pain Descriptors / Indicators: Sore;Grimacing;Guarding Pain Intervention(s): Monitored during session;Premedicated before session    Home Living                      Prior Function            PT Goals (current goals can now be found in the care plan section) Progress towards PT goals: Progressing toward goals    Frequency    Min 2X/week      PT Plan Current plan remains appropriate    Co-evaluation              AM-PAC PT "6 Clicks" Mobility  Outcome Measure  Help needed turning from your back to your side while in a flat bed without using bedrails?: Total Help needed moving from lying on your back to sitting on the side of a flat bed without using bedrails?: Total Help needed moving to and from a bed to a chair (including a wheelchair)?: Total Help needed standing up from a chair using your arms (e.g., wheelchair or bedside chair)?: Total Help needed to walk in hospital room?: Total Help needed climbing 3-5 steps with a railing? : Total 6 Click Score: 6    End of Session   Activity Tolerance: Patient limited by lethargy Patient left: in bed;with call bell/phone within reach;with nursing/sitter in room;with family/visitor present Nurse Communication: Mobility status;Need for lift equipment PT Visit Diagnosis: Difficulty in walking, not elsewhere classified (R26.2);Muscle weakness (generalized) (M62.81)     Time: 7471-8550 PT Time  Calculation (min) (ACUTE ONLY): 42 min  Charges:  $Therapeutic Activity: 38-52 mins                     Tresa Endo PT Acute Rehabilitation Services Pager [phone] Office [phone]    Claretha Cooper 10/23/2019, 12:26 PM

## 2019-10-24 DIAGNOSIS — Z7189 Other specified counseling: Secondary | ICD-10-CM

## 2019-10-24 DIAGNOSIS — I5043 Acute on chronic combined systolic (congestive) and diastolic (congestive) heart failure: Secondary | ICD-10-CM

## 2019-10-24 DIAGNOSIS — K631 Perforation of intestine (nontraumatic): Secondary | ICD-10-CM

## 2019-10-24 DIAGNOSIS — Z515 Encounter for palliative care: Secondary | ICD-10-CM

## 2019-10-24 DIAGNOSIS — R531 Weakness: Secondary | ICD-10-CM

## 2019-10-24 DIAGNOSIS — I110 Hypertensive heart disease with heart failure: Secondary | ICD-10-CM

## 2019-10-24 LAB — HEPARIN LEVEL (UNFRACTIONATED): Heparin Unfractionated: 0.37 [IU]/mL (ref 0.30–0.70)

## 2019-10-24 LAB — COMPREHENSIVE METABOLIC PANEL WITH GFR
ALT: 46 U/L — ABNORMAL HIGH (ref 0–44)
AST: 33 U/L (ref 15–41)
Albumin: 1.8 g/dL — ABNORMAL LOW (ref 3.5–5.0)
Alkaline Phosphatase: 62 U/L (ref 38–126)
Anion gap: 9 (ref 5–15)
BUN: 34 mg/dL — ABNORMAL HIGH (ref 8–23)
CO2: 22 mmol/L (ref 22–32)
Calcium: 7.6 mg/dL — ABNORMAL LOW (ref 8.9–10.3)
Chloride: 106 mmol/L (ref 98–111)
Creatinine, Ser: 0.87 mg/dL (ref 0.61–1.24)
GFR calc Af Amer: >60 mL/min
GFR calc non Af Amer: >60 mL/min
Glucose, Bld: 117 mg/dL — ABNORMAL HIGH (ref 70–99)
Potassium: 4.1 mmol/L (ref 3.5–5.1)
Sodium: 137 mmol/L (ref 135–145)
Total Bilirubin: 0.8 mg/dL (ref 0.3–1.2)
Total Protein: 6.1 g/dL — ABNORMAL LOW (ref 6.5–8.1)

## 2019-10-24 LAB — GLUCOSE, CAPILLARY
Glucose-Capillary: 110 mg/dL — ABNORMAL HIGH (ref 70–99)
Glucose-Capillary: 131 mg/dL — ABNORMAL HIGH (ref 70–99)
Glucose-Capillary: 121 mg/dL — ABNORMAL HIGH (ref 70–99)
Glucose-Capillary: 111 mg/dL — ABNORMAL HIGH (ref 70–99)
Glucose-Capillary: 112 mg/dL — ABNORMAL HIGH (ref 70–99)
Glucose-Capillary: 140 mg/dL — ABNORMAL HIGH (ref 70–99)
Glucose-Capillary: 130 mg/dL — ABNORMAL HIGH (ref 70–99)

## 2019-10-24 LAB — CBC WITH DIFFERENTIAL/PLATELET
Abs Immature Granulocytes: 0.25 10*3/uL — ABNORMAL HIGH (ref 0.00–0.07)
Basophils Absolute: 0 10*3/uL (ref 0.0–0.1)
Basophils Relative: 0 %
Eosinophils Absolute: 0.3 10*3/uL (ref 0.0–0.5)
Eosinophils Relative: 3 %
HCT: 22.6 % — ABNORMAL LOW (ref 39.0–52.0)
Hemoglobin: 7.2 g/dL — ABNORMAL LOW (ref 13.0–17.0)
Immature Granulocytes: 2 %
Lymphocytes Relative: 12 %
Lymphs Abs: 1.3 10*3/uL (ref 0.7–4.0)
MCH: 26.2 pg (ref 26.0–34.0)
MCHC: 31.9 g/dL (ref 30.0–36.0)
MCV: 82.2 fL (ref 80.0–100.0)
Monocytes Absolute: 0.9 10*3/uL (ref 0.1–1.0)
Monocytes Relative: 8 %
Neutro Abs: 8.6 10*3/uL — ABNORMAL HIGH (ref 1.7–7.7)
Neutrophils Relative %: 75 %
Platelets: 401 10*3/uL — ABNORMAL HIGH (ref 150–400)
RBC: 2.75 MIL/uL — ABNORMAL LOW (ref 4.22–5.81)
RDW: 17.2 % — ABNORMAL HIGH (ref 11.5–15.5)
WBC: 11.4 10*3/uL — ABNORMAL HIGH (ref 4.0–10.5)
nRBC: 0 % (ref 0.0–0.2)

## 2019-10-24 LAB — MAGNESIUM: Magnesium: 2.3 mg/dL (ref 1.7–2.4)

## 2019-10-24 LAB — PHOSPHORUS: Phosphorus: 3.2 mg/dL (ref 2.5–4.6)

## 2019-10-24 MED ORDER — FLUCONAZOLE IN SODIUM CHLORIDE 400-0.9 MG/200ML-% IV SOLN
400.0000 mg | INTRAVENOUS | Status: DC
  Administered 2019-10-25 – 2019-11-05 (×12): 400 mg via INTRAVENOUS
  Filled 2019-10-24 (×12): qty 200

## 2019-10-24 MED ORDER — FLUCONAZOLE IN SODIUM CHLORIDE 400-0.9 MG/200ML-% IV SOLN
800.0000 mg | Freq: Once | INTRAVENOUS | Status: DC
  Filled 2019-10-24: qty 400

## 2019-10-24 MED ORDER — FLUCONAZOLE IN SODIUM CHLORIDE 400-0.9 MG/200ML-% IV SOLN
800.0000 mg | INTRAVENOUS | Status: AC
  Administered 2019-10-24: 800 mg via INTRAVENOUS
  Filled 2019-10-24: qty 400

## 2019-10-24 MED ORDER — TRAVASOL 10 % IV SOLN
INTRAVENOUS | Status: AC
  Filled 2019-10-24: qty 1208.4

## 2019-10-24 MED ORDER — MORPHINE SULFATE (PF) 2 MG/ML IV SOLN
2.0000 mg | INTRAVENOUS | Status: DC | PRN
  Administered 2019-10-24 – 2019-10-25 (×5): 2 mg via INTRAVENOUS
  Filled 2019-10-24 (×5): qty 1

## 2019-10-24 NOTE — Progress Notes (Signed)
ANTICOAGULATION CONSULT NOTE - Follow Up Consult  Pharmacy Consult for Heparin Indication: chest pain/ACS  Allergies  Allergen Reactions  . Losartan Potassium Other (See Comments)    Unknown reaction that happened 15 years ago   Patient Measurements: Height: 5\' 11"  (180.3 cm) (per daughter) Weight: 92.1 kg (203 lb 0.7 oz) IBW/kg (Calculated) : 75.3 Heparin Dosing Weight: 94 kg  Vital Signs: Temp: 98 F (36.7 C) (06/26 0400) Temp Source: Axillary (06/26 0400) BP: 128/58 (06/26 0500) Pulse Rate: 79 (06/26 0500)  Labs: Recent Labs    10/22/19 0410 10/22/19 0410 10/23/19 0500 10/24/19 0414 10/24/19 0627  HGB 8.2*   < > 8.9* 7.2*  --   HCT 25.5*  --  27.6* 22.6*  --   PLT 329  --  357 401*  --   HEPARINUNFRC 0.35  --  0.32  --  0.37  CREATININE 0.95  --  0.86 0.87  --    < > = values in this interval not displayed.   Estimated Creatinine Clearance: 73.3 mL/min (by C-G formula based on SCr of 0.87 mg/dL).  Medications:  Infusions:  . sodium chloride    . sodium chloride Stopped (10/20/19 2021)  . heparin 1,200 Units/hr (10/23/19 1800)  . piperacillin-tazobactam (ZOSYN)  IV 3.375 g (10/24/19 0344)  . TPN ADULT (ION) 95 mL/hr at 10/23/19 1800   Assessment: 86 yo male with perforated duodenal ulcer s/p omental patch repair.  Pharmacy consulted to dose IV heparin for NSTEMI as well as for acute deep vein thrombosis involving the left axillary vein, surrounding the PICC line. Findings consistent with acute superficial vein thrombosis involving the left cephalic vein.  Today, 10/24/2019   Heparin level therapeutic- 0.37 on 1200 units/hr   Hgb decr 7.2, Plts elevated 401  Incisional and rectal bleeding noted 6/21, none further noted since  Goal of Therapy:  Heparin level 0.3-0.7 units/ml Monitor platelets by anticoagulation protocol: Yes   Plan:  Continue Heparin drip at 1200 units/hr Daily heparin level and CBC Continue to monitor for signs/symptoms of  bleeding  Minda Ditto PharmD 10/24/2019, 8:17 AM

## 2019-10-24 NOTE — Progress Notes (Signed)
Assessment & Plan: Perforated Duodenal Ulcer S/p Exploratory laparotomy, omental patch repair of duodenal ulcer - Dr. [name] - 10/12/2019  Continue NG, NPO  IR drain with purulent fluid - continue to monitor  WBC 11 - stable  IV Zosyn -TPN  VTE - SCDs, heparin gtt  ID -Zosyn 6/14 >> day10.  Foley - per CCM Follow-Up - as per primary  Plan:Continue NPO, NG decompression, IR drain in place.  Discussed with family at bedside this AM.  Plan repeat UGI series early next week.        [name], MD       [facility]       Office: [phone]   Chief Complaint: Perforated ulcer  Subjective: Patient in bed, responsive.  Family at bedside.  TRH MD, nursing at bedside this AM.  Objective: Vital signs in last 24 hours: Temp:  [97.6 F (36.4 C)-99.7 F (37.6 C)] 98 F (36.7 C) (06/26 0400) Pulse Rate:  [78-90] 79 (06/26 0500) Resp:  [19-31] 21 (06/26 0500) BP: (106-147)/(41-72) 128/58 (06/26 0500) SpO2:  [93 %-100 %] 100 % (06/26 0500) Last BM Date: 10/20/19  Intake/Output from previous day: 06/25 0701 - 06/26 0700 In: 3212.4 [I.V.:3123.8; IV Piggyback:88.6] Out: 1700 [Urine:1000; Emesis/NG output:700] Intake/Output this shift: No intake/output data recorded.  Physical Exam: HEENT - sclerae clear, mucous membranes moist Neck - soft Abdomen - soft, dressing dry and recently changed; JP with pink/purulent output, moderate; bilious in NG  Lab Results:  Recent Labs    10/23/19 0500 10/24/19 0414  WBC 11.9* 11.4*  HGB 8.9* 7.2*  HCT 27.6* 22.6*  PLT 357 401*   BMET Recent Labs    10/23/19 0500 10/24/19 0414  NA 140 137  K 4.2 4.1  CL 114* 106  CO2 23 22  GLUCOSE 136* 117*  BUN 31* 34*  CREATININE 0.86 0.87  CALCIUM 7.1* 7.6*   PT/INR No results for input(s): LABPROT, INR in the last 72 hours. Comprehensive Metabolic Panel:    Component Value Date/Time   NA 137 10/24/2019 0414   NA 140 10/23/2019 0500   NA 137  11/02/2016 1509   NA 137 06/22/2015 1421   K 4.1 10/24/2019 0414   K 4.2 10/23/2019 0500   K 4.4 11/02/2016 1509   K 4.8 06/22/2015 1421   CL 106 10/24/2019 0414   CL 114 (H) 10/23/2019 0500   CO2 22 10/24/2019 0414   CO2 23 10/23/2019 0500   CO2 27 11/02/2016 1509   CO2 25 06/22/2015 1421   BUN 34 (H) 10/24/2019 0414   BUN 31 (H) 10/23/2019 0500   BUN 16.7 11/02/2016 1509   BUN 17.7 06/22/2015 1421   CREATININE 0.87 10/24/2019 0414   CREATININE 0.86 10/23/2019 0500   CREATININE 0.9 11/02/2016 1509   CREATININE 0.9 06/22/2015 1421   GLUCOSE 117 (H) 10/24/2019 0414   GLUCOSE 136 (H) 10/23/2019 0500   GLUCOSE 153 (H) 11/02/2016 1509   GLUCOSE 126 06/22/2015 1421   CALCIUM 7.6 (L) 10/24/2019 0414   CALCIUM 7.1 (L) 10/23/2019 0500   CALCIUM 9.1 11/02/2016 1509   CALCIUM 9.3 06/22/2015 1421   AST 33 10/24/2019 0414   AST 31 10/23/2019 0500   AST 15 11/02/2016 1509   AST 19 06/22/2015 1421   ALT 46 (H) 10/24/2019 0414   ALT 42 10/23/2019 0500   ALT 12 11/02/2016 1509   ALT 18 06/22/2015 1421   ALKPHOS 62 10/24/2019 0414   ALKPHOS 61 10/23/2019  0500   ALKPHOS 58 11/02/2016 1509   ALKPHOS 71 06/22/2015 1421   BILITOT 0.8 10/24/2019 0414   BILITOT 0.8 10/23/2019 0500   BILITOT 0.71 11/02/2016 1509   BILITOT 0.40 06/22/2015 1421   PROT 6.1 (L) 10/24/2019 0414   PROT 5.6 (L) 10/23/2019 0500   PROT 7.0 11/02/2016 1509   PROT 6.9 06/22/2015 1421   PROT 7.4 06/22/2015 1421   ALBUMIN 1.8 (L) 10/24/2019 0414   ALBUMIN 1.6 (L) 10/23/2019 0500   ALBUMIN 3.6 11/02/2016 1509   ALBUMIN 3.8 06/22/2015 1421    Studies/Results: CT IMAGE GUIDED FLUID DRAIN BY CATHETER  Result Date: 10/22/2019 INDICATION: 86 year old male with right upper quadrant fluid collection, possible abscess, referred for drainage EXAM: CT-GUIDED DRAIN PLACEMENT MEDICATIONS: The patient is currently admitted to the hospital and receiving intravenous antibiotics. The antibiotics were administered within an  appropriate time frame prior to the initiation of the procedure. ANESTHESIA/SEDATION: Fentanyl 1.0 mcg IV; Versed 25 mg IV Moderate Sedation Time:  16 minutes The patient was continuously monitored during the procedure by the interventional radiology nurse under my direct supervision. COMPLICATIONS: None PROCEDURE: Informed written consent was obtained from the patient after a thorough discussion of the procedural risks, benefits and alternatives. All questions were addressed. Maximal Sterile Barrier Technique was utilized including caps, mask, sterile gowns, sterile gloves, sterile drape, hand hygiene and skin antiseptic. A timeout was performed prior to the initiation of the procedure. Patient positioned supine position on the CT gantry table. Scout CT was acquired. Patient is prepped and draped in the usual sterile fashion. 1% lidocaine was used for local anesthesia. Using modified Seldinger technique, 10 French drain was placed into the right upper quadrant fluid collection, adjacent to the gallbladder in the sub a Paddock space. Proximally 10 cc purulent fluid aspirated and sent for culture. Drain was sutured in position attached to bulb suction. Final CT was acquired. Patient tolerated the procedure well and remained hemodynamically stable throughout. No complications were encountered and no significant blood loss. IMPRESSION: Status post CT-guided drainage of right upper quadrant fluid collection. Signed, Dulcy Fanny. Dellia Nims, RPVI [facility] Radiology Electronically Signed   By: Corrie Mckusick D.O.   On: 10/22/2019 13:43      [name] 10/24/2019  Patient ID: Raynaldo Opitz, male   DOB: 1932-07-16, 86 y.o.   MRN: [id number]

## 2019-10-24 NOTE — Progress Notes (Signed)
Chief Complaint: Patient was seen today for follow up RUQ drain   Supervising Physician: Sandi Mariscal  Patient Status: [facility] - In-pt  Subjective: S/p perc drain to RUA abscess that developed after surgical repair of perf duodenal ulcer. Feeling okay today. Daughter at bedside  Objective: Physical Exam: BP (!) 128/58   Pulse 79   Temp 98 F (36.7 C) (Axillary)   Resp (!) 21   Ht 5\' 11"  (1.803 m) Comment: per daughter  Wt 92.1 kg   SpO2 100%   BMI 28.32 kg/m  RUQ drain intact, site clean. Blood tinged purulent output.   Current Facility-Administered Medications:  .  0.9 %  sodium chloride infusion, , Intravenous, Continuous, Sood, Vineet, MD .  0.9 %  sodium chloride infusion, , Intravenous, PRN, Chesley Mires, MD, Stopped at 10/20/19 2021 .  acetaminophen (TYLENOL) tablet 650 mg, 650 mg, Oral, Q6H PRN **OR** acetaminophen (TYLENOL) suppository 650 mg, 650 mg, Rectal, Q6H PRN, Allie Bossier, MD, 650 mg at 10/24/19 0345 .  Chlorhexidine Gluconate Cloth 2 % PADS 6 each, 6 each, Topical, Daily, Chesley Mires, MD, 6 each at 10/24/19 1029 .  cloNIDine (CATAPRES - Dosed in mg/24 hr) patch 0.2 mg, 0.2 mg, Transdermal, Weekly, Christinia Gully B, MD, 0.2 mg at 10/24/19 1030 .  fentaNYL (SUBLIMAZE) injection 50 mcg, 50 mcg, Intravenous, Q2H PRN, Mannam, Praveen, MD, 50 mcg at 10/24/19 0825 .  heparin ADULT infusion 100 units/mL (25000 units/273mL sodium chloride 0.45%), 1,200 Units/hr, Intravenous, Continuous, Emiliano Dyer, RPH, Last Rate: 12 mL/hr at 10/23/19 1800, 1,200 Units/hr at 10/23/19 1800 .  hydrALAZINE (APRESOLINE) injection 20 mg, 20 mg, Intravenous, Q4H PRN, Christinia Gully B, MD .  insulin aspart (novoLOG) injection 0-20 Units, 0-20 Units, Subcutaneous, Q4H, Chesley Mires, MD, 3 Units at 10/24/19 0824 .  insulin detemir (LEVEMIR) injection 10 Units, 10 Units, Subcutaneous, BID, Chesley Mires, MD, 10 Units at 10/24/19 1030 .  lidocaine (LIDODERM) 5 % 1 patch, 1 patch,  Transdermal, Q24H, Mannam, Praveen, MD, 1 patch at 10/23/19 1359 .  lip balm (CARMEX) ointment, , Topical, PRN, Tanda Rockers, MD, Given at 10/18/19 2154 .  LORazepam (ATIVAN) injection 0.5 mg, 0.5 mg, Intravenous, Q12H PRN, Alma Friendly, MD .  MEDLINE mouth rinse, 15 mL, Mouth Rinse, BID, Sood, Vineet, MD, 15 mL at 10/24/19 1030 .  metoprolol tartrate (LOPRESSOR) injection 5 mg, 5 mg, Intravenous, Q4H, Tanda Rockers, MD, 5 mg at 10/24/19 0824 .  nitroGLYCERIN (NITROGLYN) 2 % ointment 1 inch, 1 inch, Topical, Q6H, Ogan, [place]wo U, MD, 1 inch at 10/24/19 0618 .  ondansetron (ZOFRAN) injection 4 mg, 4 mg, Intravenous, Q6H PRN, Allie Bossier, MD, 4 mg at 10/17/19 3016 .  pantoprazole (PROTONIX) injection 40 mg, 40 mg, Intravenous, Q12H, Baron-Johnson, Alison, PA-C, 40 mg at 10/24/19 1029 .  piperacillin-tazobactam (ZOSYN) IVPB 3.375 g, 3.375 g, Intravenous, Q8H, Adrian Saran, [place], Stopped at 10/24/19 0744 .  sodium chloride flush (NS) 0.9 % injection 10-40 mL, 10-40 mL, Intracatheter, Q12H, Sood, Vineet, MD, 10 mL at 10/24/19 1029 .  sodium chloride flush (NS) 0.9 % injection 10-40 mL, 10-40 mL, Intracatheter, PRN, Chesley Mires, MD, 10 mL at 10/23/19 1720 .  TPN ADULT (ION), , Intravenous, Continuous TPN, Minda Ditto, RPH, Last Rate: 95 mL/hr at 10/23/19 1800, Rate Verify at 10/23/19 1800 .  TPN ADULT (ION), , Intravenous, Continuous TPN, Minda Ditto, RPH  Labs: CBC Recent Labs    10/23/19 0500 10/24/19 0414  WBC 11.9* 11.4*  HGB 8.9* 7.2*  HCT 27.6* 22.6*  PLT 357 401*   BMET Recent Labs    10/23/19 0500 10/24/19 0414  NA 140 137  K 4.2 4.1  CL 114* 106  CO2 23 22  GLUCOSE 136* 117*  BUN 31* 34*  CREATININE 0.86 0.87  CALCIUM 7.1* 7.6*   LFT Recent Labs    10/24/19 0414  PROT 6.1*  ALBUMIN 1.8*  AST 33  ALT 46*  ALKPHOS 62  BILITOT 0.8   PT/INR No results for input(s): LABPROT, INR in the last 72 hours.   Studies/Results: CT IMAGE GUIDED  FLUID DRAIN BY CATHETER  Result Date: 10/22/2019 INDICATION: 86 year old male with right upper quadrant fluid collection, possible abscess, referred for drainage EXAM: CT-GUIDED DRAIN PLACEMENT MEDICATIONS: The patient is currently admitted to the hospital and receiving intravenous antibiotics. The antibiotics were administered within an appropriate time frame prior to the initiation of the procedure. ANESTHESIA/SEDATION: Fentanyl 1.0 mcg IV; Versed 25 mg IV Moderate Sedation Time:  16 minutes The patient was continuously monitored during the procedure by the interventional radiology nurse under my direct supervision. COMPLICATIONS: None PROCEDURE: Informed written consent was obtained from the patient after a thorough discussion of the procedural risks, benefits and alternatives. All questions were addressed. Maximal Sterile Barrier Technique was utilized including caps, mask, sterile gowns, sterile gloves, sterile drape, hand hygiene and skin antiseptic. A timeout was performed prior to the initiation of the procedure. Patient positioned supine position on the CT gantry table. Scout CT was acquired. Patient is prepped and draped in the usual sterile fashion. 1% lidocaine was used for local anesthesia. Using modified Seldinger technique, 10 French drain was placed into the right upper quadrant fluid collection, adjacent to the gallbladder in the sub a Paddock space. Proximally 10 cc purulent fluid aspirated and sent for culture. Drain was sutured in position attached to bulb suction. Final CT was acquired. Patient tolerated the procedure well and remained hemodynamically stable throughout. No complications were encountered and no significant blood loss. IMPRESSION: Status post CT-guided drainage of right upper quadrant fluid collection. Signed, Dulcy Fanny. Dellia Nims, RPVI [facility] Radiology Electronically Signed   By: Corrie Mckusick D.O.   On: 10/22/2019 13:43     Assessment/Plan: S/p perc drain RUQ abscess Post op perf duod ulcer repair. Plan per primary team. Rec CT when output decreases or changes to more serous appearance.    LOS: 12 days   I spent a total of 15 minutes in face to face in clinical consultation, greater than 50% of which was counseling/coordinating care for RUQ abscess drain  Ascencion Dike PA-C 10/24/2019 11:02 AM

## 2019-10-24 NOTE — Progress Notes (Signed)
Pharmacy Antibiotic Note  Matthew Schwartz is a 86 y.o. male admitted on 10/12/2019 with fluconazole Pharmacy has been consulted for intra-abdominal infection dosing.  Plan: Fluconazole 800mg  IV x 1 then 400mg  q24h Follow renal function  Height: 5\' 11"  (180.3 cm) (per daughter) Weight: 92.1 kg (203 lb 0.7 oz) IBW/kg (Calculated) : 75.3  Temp (24hrs), Avg:98.7 F (37.1 C), Min:98 F (36.7 C), Max:99.7 F (37.6 C)  Recent Labs  Lab 10/20/19 0547 10/21/19 0645 10/22/19 0410 10/23/19 0500 10/24/19 0414  WBC 20.6* 17.7* 15.5* 11.9* 11.4*  CREATININE 0.79 0.82 0.95 0.86 0.87    Estimated Creatinine Clearance: 73.3 mL/min (by C-G formula based on SCr of 0.87 mg/dL).    Allergies  Allergen Reactions  . Losartan Potassium Other (See Comments)    Unknown reaction that happened 15 years ago     Thank you for allowing pharmacy to be a part of this patient's care.  Dolly Rias RPh 10/24/2019, 5:50 PM

## 2019-10-24 NOTE — Consult Note (Signed)
Consultation Note Date: 10/24/2019   Patient Name: Matthew Schwartz  DOB: Nov 12, 1932  MRN: [id number]  Age / Sex: 86 y.o., male  PCP: [name], MD Referring Physician: Alma Friendly, MD  Reason for Consultation: Establishing goals of care  HPI/Patient Profile: 86 y.o. male  with past medical history of hypertension, dyslipidemia, hyperlipidemia, diabetes, malignant neoplasm of prostate, history of constipation, admitted on 10/12/2019 with nausea/vomiting, abdominal pain and constipation.   Clinical Assessment and Goals of Care: Matthew Schwartz is a 86 year old gentleman who lives at home with his wife in [place], [place].  Patient is originally from [place].  Patient has 2 daughters 1 of whom is present at the bedside.  Patient arrived with nausea vomiting abdominal discomfort and constipation.  Patient required emergency surgery for surgical repair of perforated duodenal ulcer.  He also has percutaneous drain in the right upper quadrant for an abscess for which interventional radiology is following.  General surgery is following.  Palliative consult for CODE STATUS and goals of care discussions has been requested.  Patient is awake alert resting in bed.  He has an NG tube with ongoing output.  He is able to verbalize some.  His daughters present at the bedside.  Introduced myself and palliative care as follows:  Palliative medicine is specialized medical care for people living with serious illness. It focuses on providing relief from the symptoms and stress of a serious illness. The goal is to improve quality of life for both the patient and the family.  Goals of care: Broad aims of medical therapy in relation to the patient's values and preferences. Our aim is to provide medical care aimed at enabling patients to achieve the goals that matter most to them, given the circumstances of their particular medical  situation and their constraints.   Brief life review performed.  Goals wishes and values important to the patient and family as a unit attempted to be explored.  Patient's daughter describes him as an extremely independent person who is active and well involved with his local community.  He was independent was not even using a walker and was not requiring help with any of his ADLs.  We discussed about CODE STATUS, advanced directives as well as disposition planning.  Patient's daughter states that patient is awaiting evaluation from PT OT and speech pathology.  Patient remains hopeful that NG tube will be discontinued and that he will be started on some kind of oral diet.  We discussed about rehab options.  Patient's daughter is asking about inpatient rehab.  We also talked about skilled nursing facility services with rehab attempt and palliative care following towards the end of this hospitalization.  All of their questions addressed to the best of my ability.  HCPOA Wife, 2 daughters  SUMMARY OF RECOMMENDATIONS   Full code, full scope care PT OT speech Continue current mode of care Inpatient rehab versus skilled nursing facility rehab with palliative services following towards the end of this hospitalization. Thank you for the consult.  Code  Status/Advance Care Planning:  Full code    Symptom Management:   As above  Palliative Prophylaxis:   Delirium Protocol  Additional Recommendations (Limitations, Scope, Preferences):  Full Scope Treatment  Psycho-social/Spiritual:   Desire for further Chaplaincy support:yes  Additional Recommendations: Caregiving  Support/Resources  Prognosis:   Unable to determine  Discharge Planning: To Be Determined      Primary Diagnoses: Present on Admission: . Obstipation . HTN (hypertension) . Malignant neoplasm of prostate ([place]) . Hyperlipidemia . HLD (hyperlipidemia) . Diabetes mellitus type 2, uncontrolled, with complications  ([place]) . Perforated bowel ([place]) . Perforated duodenal ulcer ([place])   I have reviewed the medical record, interviewed the patient and family, and examined the patient. The following aspects are pertinent.  Past Medical History:  Diagnosis Date  . Diabetes mellitus without complication ([place])   . Dyslipidemia   . GERD (gastroesophageal reflux disease)   . Hypertension   . Prostate cancer ([place]) 12/23/13   Adenocarcinoma  . PSA elevation   . S/P radiation therapy 05/18/2014 through 07/16/2014    Prostate 7800 cGy in 40 sessions, seminal vesicles 5600 cGy in 40 sessions    Social History   Socioeconomic History  . Marital status: Married    Spouse name: Not on file  . Number of children: Not on file  . Years of education: Not on file  . Highest education level: Not on file  Occupational History  . Occupation: retired  Tobacco Use  . Smoking status: Former Smoker    Quit date: 06/14/1994    Years since quitting: 25.3  . Smokeless tobacco: Never Used  Vaping Use  . Vaping Use: Never used  Substance and Sexual Activity  . Alcohol use: No  . Drug use: No  . Sexual activity: Not on file  Other Topics Concern  . Not on file  Social History Narrative  . Not on file   Social Determinants of Health   Financial Resource Strain:   . Difficulty of Paying Living Expenses:   Food Insecurity:   . Worried About Charity fundraiser in the Last Year:   . Arboriculturist in the Last Year:   Transportation Needs:   . Film/video editor (Medical):   Marland Kitchen Lack of Transportation (Non-Medical):   Physical Activity:   . Days of Exercise per Week:   . Minutes of Exercise per Session:   Stress:   . Feeling of Stress :   Social Connections:   . Frequency of Communication with Friends and Family:   . Frequency of Social Gatherings with Friends and Family:   . Attends Religious Services:   . Active Member of Clubs or  Organizations:   . Attends Archivist Meetings:   Marland Kitchen Marital Status:    Family History  Problem Relation Age of Onset  . CVA Mother   . Heart disease Sister   . Coronary artery disease Sister   . Heart disease Brother   . Coronary artery disease Brother   . Heart disease Brother   . Coronary artery disease Brother    Scheduled Meds: . Chlorhexidine Gluconate Cloth  6 each Topical Daily  . cloNIDine  0.2 mg Transdermal Weekly  . insulin aspart  0-20 Units Subcutaneous Q4H  . insulin detemir  10 Units Subcutaneous BID  . lidocaine  1 patch Transdermal Q24H  . mouth rinse  15 mL Mouth Rinse BID  . metoprolol tartrate  5 mg Intravenous Q4H  . nitroGLYCERIN  1  inch Topical Q6H  . pantoprazole (PROTONIX) IV  40 mg Intravenous Q12H  . sodium chloride flush  10-40 mL Intracatheter Q12H   Continuous Infusions: . sodium chloride    . sodium chloride Stopped (10/20/19 2021)  . heparin 1,200 Units/hr (10/23/19 1800)  . piperacillin-tazobactam (ZOSYN)  IV 3.375 g (10/24/19 1240)  . TPN ADULT (ION) 95 mL/hr at 10/23/19 1800  . TPN ADULT (ION)     PRN Meds:.sodium chloride, acetaminophen **OR** acetaminophen, fentaNYL (SUBLIMAZE) injection, hydrALAZINE, lip balm, LORazepam, ondansetron (ZOFRAN) IV, sodium chloride flush Medications Prior to Admission:  Prior to Admission medications   Medication Sig Start Date End Date Taking? Authorizing Provider  alfuzosin (UROXATRAL) 10 MG 24 hr tablet Take 10 mg by mouth daily. 09/07/19  Yes [provider]  amLODipine (NORVASC) 5 MG tablet Take 5 mg by mouth daily. 07/13/19  Yes [provider]  aspirin 81 MG tablet Take 81 mg by mouth every morning.    Yes [provider]  atorvastatin (LIPITOR) 40 MG tablet Take 40 mg by mouth at bedtime.    Yes [provider]  dicyclomine (BENTYL) 20 MG tablet Take 1 tablet (20 mg total) by mouth 2 (two) times daily. 10/11/19  Yes Nuala Alpha A, PA-C  famotidine  (PEPCID) 20 MG tablet Take 20 mg by mouth 2 (two) times daily.   Yes [provider]  hydrocortisone valerate cream (WESTCORT) 0.2 % Apply 1 application topically 2 (two) times daily.  09/20/19  Yes [provider]  isosorbide mononitrate (IMDUR) 60 MG 24 hr tablet Take 60 mg by mouth every evening.   Yes [provider]  lactulose (CHRONULAC) 10 GM/15ML solution Take 10 g by mouth as needed for mild constipation.   Yes [provider]  metFORMIN (GLUCOPHAGE) 500 MG tablet Take 500 mg by mouth 2 (two) times daily. 09/20/19  Yes [provider]  metoprolol succinate (TOPROL-XL) 50 MG 24 hr tablet Take 50 mg by mouth 2 (two) times daily. 04/07/14  Yes [provider]  potassium chloride (KLOR-CON) 10 MEQ tablet Take 2 tablets (20 mEq total) by mouth daily for 3 days. 10/11/19 10/14/19 Yes Nuala Alpha A, PA-C  PROCTOSOL HC 2.5 % rectal cream Place 1 application rectally daily as needed for hemorrhoids or itching.  11/06/13  Yes [provider]  ramipril (ALTACE) 10 MG capsule Take 10 mg by mouth 2 (two) times daily.    Yes [provider]  RESTASIS 0.05 % ophthalmic emulsion Place 1 drop into both eyes 2 (two) times daily.  09/09/19  Yes [provider]  senna-docusate (SENOKOT-S) 8.6-50 MG tablet Take 1 tablet by mouth daily. 10/09/19  Yes Lucrezia Starch, MD  [facility] injection Inject as directed once. 04/10/17   [provider]   Allergies  Allergen Reactions  . Losartan Potassium Other (See Comments)    Unknown reaction that happened 15 years ago   Review of Systems Denies pain Physical Exam Awake alert oriented, responds appropriately Has NG tube which is connected to suction and is draining S1-S2 Has diffuse atrophy bilateral lower extremities Regular work of breathing Appears chronically ill Vital Signs: BP (!) 129/55   Pulse 89   Temp 98 F (36.7 C) (Axillary)   Resp 18   Ht 5\' 11"  (1.803 m)  Comment: per daughter  Wt 92.1 kg   SpO2 100%   BMI 28.32 kg/m  Pain Scale: 0-10   Pain Score: 7    SpO2: SpO2: 100 %  O2 Device:SpO2: 100 % O2 Flow Rate: .O2 Flow Rate (L/min): 2 L/min  IO: Intake/output summary:   Intake/Output Summary (Last 24 hours) at 10/24/2019 1301 Last data filed at 10/24/2019 0600 Gross per 24 hour  Intake 1901.33 ml  Output 1700 ml  Net 201.33 ml    LBM: Last BM Date: 10/20/19 Baseline Weight: Weight: 96.4 kg Most recent weight: Weight: 92.1 kg     Palliative Assessment/Data:   PPS 30%  Time In:  12 Time Out:  1300 Time Total:  60  Greater than 50%  of this time was spent counseling and coordinating care related to the above assessment and plan.  Signed by: Loistine Chance, MD   Please contact Palliative Medicine Team phone at [phone] for questions and concerns.  For individual provider: See Shea Evans

## 2019-10-24 NOTE — Progress Notes (Signed)
PHARMACY - TOTAL PARENTERAL NUTRITION CONSULT NOTE   Indication: Prolonged ileus  Patient Measurements: Height: 5\' 11"  (180.3 cm) (per daughter) Weight: 92.1 kg (203 lb 0.7 oz) IBW/kg (Calculated) : 75.3   Body mass index is 28.32 kg/m. Usual Weight: 90.7 kg   Assessment:  - Pharmacy is consulted to start TPN in 86 yo male with prolonged ileus. Pt underwent exploratory laparotomy on 6/14 which revealed perforated duodenal ulcer.   Glucose / Insulin: hx of DM - on metformin PTA.  - CBGs < 150 with 60 units/bag insulin in TPN, rSSI q4h (range 100-153; goal 100-150) - 15 units of SS insulin required yesterday - Levemir 10 units BID per CCM started 6/18  Electrolytes: K (target >/= 4.0) is 4.1. Magnesium 2.3 (target >/= 2.0); others wnl including corrected calcium 9.4 Renal: SCr now WNL,  BUN elevated; bicarb WNL (UOP increased after receiving Lasix 6/16-6/17) LFTs / TGs: LFTs remain WNL, Tbili improved to WNL after procedure; TG are now WNL after stopping propofol. Previously elevated level likely spurious  Prealbumin / albumin: prealbumin <5 (6/16), 9.0 (6/21); albumin 1.8 Intake / Output; MIVF: NS at [facility]; NG output 131ml charted (doubt veracity), UOP 1180ml GI Imaging:  - 6/21 Upper GI series: small leak from superior margin of duodenal bulb. - 6/23 CT abdomen: 4.3 cm air-fluid collection in the right upper quadrant of the abdomen between the duodenal bulb and gallbladder, not containing enteric contrast. No residual leak from the duodenal bulb identified. Surgeries / Procedures:  - 6/14 Exploratory laparotomy which revealed perforated duodenal ulcer - 6/24 RUQ drain placed > 10 ml > cultured - 6/25 removal R abd JP drain   Central access:  PICC Line 6/15 TPN start date: 6/15  Nutritional Goals:   (per RD recommendation on 6/15): Kcal:  2200-2400 Protein:  115-125 grams Fluid:  >2L/d Goal TPN rate is 95 mL/hr (provides 121 g of protein and 2262 kcals per day)  Current  Nutrition:  NPO   Plan:   Continue TPN at 95 mL/hr at 1800, goal rate   Electrolytes in TPN: 80 mEq/L of Na, 80 mEq/L of K (max in TPN per [facility] policy) , 4 mEq/L of Ca, incr to 12 mEq/L of Magnesium, and 10 mmol/L of Phos. Cl:Ac ratio max Ac  Standard MVI and trace elements to TPN  Continue SSI resistant  q4h SSI, and monitor Levemir and adjust as needed   Continue 60 units of regular insulin/day in TPN   Continue MIVF to KVO mL/hr at 1800  Monitor TPN labs on Mon/Thurs  Minda Ditto PharmD 10/24/2019, 7:24 AM

## 2019-10-24 NOTE — Progress Notes (Signed)
PROGRESS NOTE  Matthew Schwartz [id number]:[id number] DOB: 10/20/1932 DOA: 10/12/2019 PCP: [name], MD  HPI/Recap of past 24 hours: HPI from PCCM 86 y/o male, former smoker, presented with nausea/vomiting, abdominal pain and constipation x 4 to 5 days.  Seen twice prior to current admit for the same with imaging studies unremarkable for cause.  On return 6/14, seen by GI and found to have concern for acute abdomen and taken for exploratory laparotomy.  Found to have perforated duodenal ulcer s/p omental patch repair.  Remained on vent post op.  PCCM consulted for critical care management.  Patient was subsequently extubated on 6/17 with very slow improvement.  [facility] hospitalist assumed care on 10/22/2019, PCCM still following.    Today, met daughter at bedside who is frustrated about patient not being able to eat by mouth, had a detailed conversation with her about the extent of his condition and how its going to be very prolonged recovery.  Patient very deconditioned, able to nod to simple questions asked, still unable to vocalize.  Patient denies any worsening abdominal pain, chest pain, shortness of breath.  Dr. [name] from [facility] was at bedside as well and gave a detailed explanation as to why the NG tube has to stay in place for now.     Assessment/Plan: Active Problems:   HTN (hypertension)   Malignant neoplasm of prostate (HCC)   Hyperlipidemia   Obstipation   HLD (hyperlipidemia)   Diabetes mellitus type 2, uncontrolled, with complications (HCC)   Perforated bowel (HCC)   Perforated duodenal ulcer ([place])   Acute respiratory insufficiency   Elevated troponin level   Acute respiratory failure with hypoxemia (HCC)   PVCs (premature ventricular contractions)   Bilateral atelectasis   Acute abdominal pain   Septic shock/peritonitis in the setting of perforated duodenal ulcer status post omental patch repair on 6/14 Shock resolved, off pressors Currently afebrile, with resolving  leukocytosis Repeat CT abdomen/pelvis done on 10/21/2019 showed some right upper quadrant fluid collection status post drain placement on 10/22/2019 by IR, removed about 10 cc of grayish purulent fluid Follow-up culture, gram stain growing moderate yeast, await final cultures Continue IV Zosyn Continue n.p.o. status, TPN, NG tube per general surgery Monitor closely  Acute respiratory failure with hypoxia in the setting of septic shock Extubated on 6/17, still unable to vocalize, overall weak Currently saturating well on room air May need SLP to evaluate for speech once NG tube is out Continue pulmonary hygiene, incentive spirometry Supplemental oxygen as needed PCCM available prn  Acute systolic HF NSTEMI/NSVT History of CAD, hypertension, hyperlipidemia Currently chest pain-free Cardiology consulted, plan for LHC once patient is very stable/able to lie flat without dyspnea/hypoxia, tolerate orally Continue Heparin gtt. Continue clonidine patch, IV Lopressor, NGT placed Hold home Norvasc, Toprol, Imdur, ASA, Lipitor  Left upper extremity DVT Cephalic/axillary vein involvement Continue IV Heparin  Diabetes mellitus type 2 A1c done on 10/12/2019 was 6.6 SSI, detemir, Accu-Cheks, hypoglycemic protocol  Anemia of critical illness/post op Daily CBC  Goals of care discussion Patient significantly deconditioned, with poor prognosis, unsure if patient will ever return back to his baseline, prolonged recovery Palliative consulted to establish goals of care, appreciate recs       Malnutrition Type:  Nutrition Problem: Inadequate oral intake Etiology: inability to eat   Malnutrition Characteristics:  Signs/Symptoms: NPO status   Nutrition Interventions:  Interventions: TPN    Estimated body mass index is 28.32 kg/m as calculated from the following:   Height as of this  encounter: 5' 11" (1.803 m).   Weight as of this encounter: 92.1 kg.     Code Status:  Full  Family Communication: Discussed with daughters on 10/24/19  Disposition Plan: Status is: Inpatient  Remains inpatient appropriate because:Inpatient level of care appropriate due to severity of illness   Dispo: The patient is from: Home              Anticipated d/c is to: SNF Vs CIR              Anticipated d/c date is: > 3 days              Patient currently is not medically stable to d/c.   Consultants:  General surgery  PCCM  Cardiology  IR  Palliative   Procedures:  Omental patch repair on 6/14  Antimicrobials:  Zosyn  DVT prophylaxis: Heparin drip   Objective: Vitals:   10/24/19 1000 10/24/19 1100 10/24/19 1200 10/24/19 1600  BP: 136/60 (!) 117/51 (!) 129/55   Pulse: 86 88 89   Resp: (!) _0 Temp:    98.4 F (36.9 C)  TempSrc:    Oral  SpO2: 100% 100% 100%   Weight:      Height:        Intake/Output Summary (Last 24 hours) at 10/24/2019 1726 Last data filed at 10/24/2019 0600 Gross per 24 hour  Intake 1423.16 ml  Output 1700 ml  Net -276.84 ml   Filed Weights   10/13/19 0923 10/22/19 0906 10/23/19 0500  Weight: 96.4 kg 90.4 kg 92.1 kg    Exam:  General: NAD, NGT in place, chronically ill-appearing, deconditioned, unable to vocalize  Cardiovascular: S1, S2 present  Respiratory:  Diminished breath sounds bilaterally  Abdomen: Soft, nontender, nondistended, bowel sounds present, dressing C/D/I  Musculoskeletal: No bilateral pedal edema noted  Skin: Normal  Psychiatry: Fair mood   Data Reviewed: CBC: Recent Labs  Lab 10/20/19 0547 10/21/19 0645 10/22/19 0410 10/23/19 0500 10/24/19 0414  WBC 20.6* 17.7* 15.5* 11.9* 11.4*  NEUTROABS 17.4* 15.0* 12.9* 9.5* 8.6*  HGB 9.3* 9.2* 8.2* 8.9* 7.2*  HCT 28.6* 28.3* 25.5* 27.6* 22.6*  MCV 80.6 80.2 81.2 81.7 82.2  PLT 257 300 329 357 119*   Basic Metabolic Panel: Recent Labs  Lab 10/20/19 0547 10/21/19 0645 10/22/19 0410 10/23/19 0500 10/24/19 0414  NA 138 139  139 140 137  K 4.3 4.0 4.2 4.2 4.1  CL 106 107 109 114* 106  CO2 _1 GLUCOSE 165* 148* 166* 136* 117*  BUN 33* 32* 35* 31* 34*  CREATININE 0.79 0.82 0.95 0.86 0.87  CALCIUM 7.7* 7.9* 7.8* 7.1* 7.6*  MG 2.2 2.3 2.4 2.0 2.3  PHOS 2.9 2.7 2.9 2.6 3.2   GFR: Estimated Creatinine Clearance: 73.3 mL/min (by C-G formula based on SCr of 0.87 mg/dL). Liver Function Tests: Recent Labs  Lab 10/19/19 0248 10/20/19 0547 10/22/19 0410 10/23/19 0500 10/24/19 0414  AST _2 33  ALT 38 39 46* 42 46*  ALKPHOS 73 71 71 61 62  BILITOT 0.9 0.9 1.0 0.8 0.8  PROT 5.6* 5.6* 6.0* 5.6* 6.1*  ALBUMIN 2.0* 1.8* 1.9* 1.6* 1.8*   No results for input(s): LIPASE, AMYLASE in the last 168 hours. No results for input(s): AMMONIA in the last 168 hours. Coagulation Profile: No results for input(s): INR, PROTIME in the last 168 hours. Cardiac Enzymes: No results for input(s): CKTOTAL, CKMB, CKMBINDEX, TROPONINI in the last  168 hours. BNP (last 3 results) No results for input(s): PROBNP in the last 8760 hours. HbA1C: No results for input(s): HGBA1C in the last 72 hours. CBG: Recent Labs  Lab 10/24/19 0337 10/24/19 0726 10/24/19 1025 10/24/19 1210 10/24/19 1612  GLUCAP 110* 131* 121* 111* 112*   Lipid Profile: No results for input(s): CHOL, HDL, LDLCALC, TRIG, CHOLHDL, LDLDIRECT in the last 72 hours. Thyroid Function Tests: No results for input(s): TSH, T4TOTAL, FREET4, T3FREE, THYROIDAB in the last 72 hours. Anemia Panel: No results for input(s): VITAMINB12, FOLATE, FERRITIN, TIBC, IRON, RETICCTPCT in the last 72 hours. Urine analysis:    Component Value Date/Time   COLORURINE YELLOW 10/11/2019 1202   APPEARANCEUR CLEAR 10/11/2019 1202   LABSPEC 1.024 10/11/2019 1202   PHURINE 6.0 10/11/2019 1202   GLUCOSEU NEGATIVE 10/11/2019 1202   HGBUR NEGATIVE 10/11/2019 1202   BILIRUBINUR NEGATIVE 10/11/2019 1202   KETONESUR NEGATIVE 10/11/2019 1202   PROTEINUR >=300 (A) 10/11/2019  1202   UROBILINOGEN 0.2 07/30/2007 1100   NITRITE NEGATIVE 10/11/2019 1202   LEUKOCYTESUR NEGATIVE 10/11/2019 1202   Sepsis Labs: _0 (procalcitonin:4,lacticidven:4)  ) Recent Results (from the past 240 hour(s))  Aerobic/Anaerobic Culture (surgical/deep wound)     Status: None (Preliminary result)   Collection Time: 10/22/19 12:57 PM   Specimen: Abdomen; Abscess  Result Value Ref Range Status   Specimen Description   Final    ABDOMEN Performed at [facility] [address], [place], [place] [address]    Special Requests   Final    NONE Performed at [facility], [place] [address], [place], [place] [address]    Gram Stain   Final    ABUNDANT WBC PRESENT, PREDOMINANTLY PMN MODERATE YEAST Performed at [facility], [place] [address] [place], [place] [address]    Culture   Final    MODERATE CANDIDA ALBICANS NO ANAEROBES ISOLATED; CULTURE IN PROGRESS FOR 5 DAYS    Report Status PENDING  Incomplete  Culture, blood (routine x 2)     Status: None (Preliminary result)   Collection Time: 10/23/19  9:06 AM   Specimen: BLOOD  Result Value Ref Range Status   Specimen Description   Final    BLOOD RIGHT ARM Performed at [facility] [address], [place], [place] [address]    Special Requests   Final    BOTTLES DRAWN AEROBIC AND ANAEROBIC Blood Culture adequate volume Performed at [facility] [address] [place], [place] [address]    Culture   Final    NO GROWTH < 24 HOURS Performed at [facility] [address] [place], [place] [address]    Report Status PENDING  Incomplete  Culture, blood (routine x 2)     Status: None (Preliminary result)   Collection Time: 10/23/19  9:06 AM   Specimen: BLOOD RIGHT HAND  Result Value Ref Range Status   Specimen Description   Final    BLOOD RIGHT HAND Performed at [facility] [address] [place], [place] [address]     Special Requests   Final    BOTTLES DRAWN AEROBIC ONLY Blood Culture adequate volume Performed at [facility] [address], [place], [place] [address]    Culture   Final    NO GROWTH < 24 HOURS Performed at [facility] [address], [place], [place] [address]    Report Status PENDING  Incomplete  Studies: No results found.  Scheduled Meds: . Chlorhexidine Gluconate Cloth  6 each Topical Daily  . cloNIDine  0.2 mg Transdermal Weekly  . insulin aspart  0-20 Units Subcutaneous Q4H  . insulin detemir  10 Units Subcutaneous BID  . lidocaine  1 patch Transdermal Q24H  . mouth rinse  15 mL Mouth Rinse BID  . metoprolol tartrate  5 mg Intravenous Q4H  . nitroGLYCERIN  1 inch Topical Q6H  . pantoprazole (PROTONIX) IV  40 mg Intravenous Q12H  . sodium chloride flush  10-40 mL Intracatheter Q12H    Continuous Infusions: . sodium chloride    . sodium chloride Stopped (10/20/19 2021)  . heparin 1,200 Units/hr (10/24/19 1453)  . piperacillin-tazobactam (ZOSYN)  IV 3.375 g (10/24/19 1240)  . TPN ADULT (ION) 95 mL/hr at 10/23/19 1800  . TPN ADULT (ION)       LOS: 12 days     Alma Friendly, MD [facility]  If 7PM-7AM, please contact night-coverage [email or web address] 10/24/2019, 5:26 PM

## 2019-10-24 NOTE — Progress Notes (Signed)
Progress Note   Subjective   Doing well today, the patient denies CP or SOB.  No new concerns  Inpatient Medications    Scheduled Meds: . Chlorhexidine Gluconate Cloth  6 each Topical Daily  . cloNIDine  0.2 mg Transdermal Weekly  . insulin aspart  0-20 Units Subcutaneous Q4H  . insulin detemir  10 Units Subcutaneous BID  . lidocaine  1 patch Transdermal Q24H  . mouth rinse  15 mL Mouth Rinse BID  . metoprolol tartrate  5 mg Intravenous Q4H  . nitroGLYCERIN  1 inch Topical Q6H  . pantoprazole (PROTONIX) IV  40 mg Intravenous Q12H  . sodium chloride flush  10-40 mL Intracatheter Q12H   Continuous Infusions: . sodium chloride    . sodium chloride Stopped (10/20/19 2021)  . heparin 1,200 Units/hr (10/23/19 1800)  . piperacillin-tazobactam (ZOSYN)  IV Stopped (10/24/19 0744)  . TPN ADULT (ION) 95 mL/hr at 10/23/19 1800  . TPN ADULT (ION)     PRN Meds: sodium chloride, acetaminophen **OR** acetaminophen, fentaNYL (SUBLIMAZE) injection, hydrALAZINE, lip balm, LORazepam, ondansetron (ZOFRAN) IV, sodium chloride flush   Vital Signs    Vitals:   10/24/19 0200 10/24/19 0300 10/24/19 0400 10/24/19 0500  BP: (!) 112/43 123/60 (!) 147/66 (!) 128/58  Pulse: 78 84 87 79  Resp: 19 (!) 21 (!) 24 (!) 21  Temp:   98 F (36.7 C)   TempSrc:   Axillary   SpO2: 99% 100% 100% 100%  Weight:      Height:        Intake/Output Summary (Last 24 hours) at 10/24/2019 1051 Last data filed at 10/24/2019 0600 Gross per 24 hour  Intake 2115.25 ml  Output 1700 ml  Net 415.25 ml   Filed Weights   10/13/19 0923 10/22/19 0906 10/23/19 0500  Weight: 96.4 kg 90.4 kg 92.1 kg    Telemetry    Sinus, short NSVT, PVCs - Personally Reviewed  Physical Exam   GEN- The patient is ill appearing, alert and oriented x 3 today.   Head- normocephalic, atraumatic Eyes-  Sclera clear, conjunctiva pink Ears- hearing intact Oropharynx- clear Neck- supple, Lungs-  normal work of breathing Heart-  Regular rate and rhythm  GI- distended Extremities- no clubbing, cyanosis, + dependant  edema  MS- diffuse atrophy Skin- no rash or lesion Psych- euthymic mood, full affect Neuro- strength and sensation are intact   Labs    Chemistry Recent Labs  Lab 10/22/19 0410 10/23/19 0500 10/24/19 0414  NA 139 140 137  K 4.2 4.2 4.1  CL 109 114* 106  CO2 24 23 22   GLUCOSE 166* 136* 117*  BUN 35* 31* 34*  CREATININE 0.95 0.86 0.87  CALCIUM 7.8* 7.1* 7.6*  PROT 6.0* 5.6* 6.1*  ALBUMIN 1.9* 1.6* 1.8*  AST 31 31 33  ALT 46* 42 46*  ALKPHOS 71 61 62  BILITOT 1.0 0.8 0.8  GFRNONAA >60 >60 >60  GFRAA >60 >60 >60  ANIONGAP 6 3* 9     Hematology Recent Labs  Lab 10/22/19 0410 10/23/19 0500 10/24/19 0414  WBC 15.5* 11.9* 11.4*  RBC 3.14* 3.38* 2.75*  HGB 8.2* 8.9* 7.2*  HCT 25.5* 27.6* 22.6*  MCV 81.2 81.7 82.2  MCH 26.1 26.3 26.2  MCHC 32.2 32.2 31.9  RDW 17.1* 17.3* 17.2*  PLT 329 357 401*     Patient ID  Matthew Schwartz is an 81M with CAD s/p RCA PCI in 2000, hypertension, hyperlipidemia, DDM admitted with a perforated duodenal ulcer  and periotonitis.  Cardiology was consulted for NSVT, intermittent left bundle branch block and anterolateral T wave inversions.  High-sensitivity troponin peaked at 1022.  Cardiac catheterization delayed by hypoxic respiratory failure and abdominal surgery.  Assessment & Plan    1.  NSVT Occurs in the setting of acute medical illness. Continue intermittent lopressor IV for now. Once taking POs could add low dose beta blocker Given advanced age and comorbidities, a conservative approach is advised. I think that we could stop IV heparin in the setting of profound anemia.   As he is also being treated for upper extremity DVT, I will defer the decision to stop anticoagulation to CCM.  I do think that in the setting of recent surgery and anemia that risk/benefit ratio probably favors stopping anticoagulation. I am not convinced that cath would prove  beneficial at this time.  We could reconsider electively once other medical issues and anemia are resolved.  2. 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 Hypertensive cardiovascular disease Difficult to manage while not taking POs  Critical care time was exclusive of separate billable procedures and treating other patients.  Critial care time was spent personally by me (independant of midlevel providers) on the following activities: development of treatment plan with patient and family at bedside as well as nursing,  evaluation of patients response to treatment, examining patient, obtaining history from family at bedside,  reviewing treatments/ interventions, lab studies, radiographic studies, pulse ox, and re-evaluation of patients condition.     The patient is critically ill with multiple organ systems failure and requires high complexity decision making for assessment and support, frequent evaluation and titration of therapies, application of advanced monitoring technologies and extensive interpretation of databases.   Critical care was necessary to treat or prevent immintent or life-threatening deterioration.   Total CCT spent directly with the patient today is 40 minutes  Thompson Grayer MD, [facility] 10/24/2019 10:58 AM

## 2019-10-25 DIAGNOSIS — R531 Weakness: Secondary | ICD-10-CM

## 2019-10-25 DIAGNOSIS — Z7189 Other specified counseling: Secondary | ICD-10-CM

## 2019-10-25 DIAGNOSIS — Z515 Encounter for palliative care: Secondary | ICD-10-CM

## 2019-10-25 LAB — CBC WITH DIFFERENTIAL/PLATELET
Abs Immature Granulocytes: 0.43 10*3/uL — ABNORMAL HIGH (ref 0.00–0.07)
Basophils Absolute: 0 10*3/uL (ref 0.0–0.1)
Basophils Relative: 0 %
Eosinophils Absolute: 0.3 10*3/uL (ref 0.0–0.5)
Eosinophils Relative: 3 %
HCT: 20.1 % — ABNORMAL LOW (ref 39.0–52.0)
Hemoglobin: 6.2 g/dL — CL (ref 13.0–17.0)
Immature Granulocytes: 5 %
Lymphocytes Relative: 17 %
Lymphs Abs: 1.5 10*3/uL (ref 0.7–4.0)
MCH: 27.1 pg (ref 26.0–34.0)
MCHC: 30.8 g/dL (ref 30.0–36.0)
MCV: 87.8 fL (ref 80.0–100.0)
Monocytes Absolute: 0.5 10*3/uL (ref 0.1–1.0)
Monocytes Relative: 6 %
Neutro Abs: 6.2 10*3/uL (ref 1.7–7.7)
Neutrophils Relative %: 69 %
Platelets: 385 10*3/uL (ref 150–400)
RBC: 2.29 MIL/uL — ABNORMAL LOW (ref 4.22–5.81)
RDW: 17.9 % — ABNORMAL HIGH (ref 11.5–15.5)
WBC: 8.9 10*3/uL (ref 4.0–10.5)
nRBC: 0 % (ref 0.0–0.2)

## 2019-10-25 LAB — GLUCOSE, CAPILLARY
Glucose-Capillary: 101 mg/dL — ABNORMAL HIGH (ref 70–99)
Glucose-Capillary: 105 mg/dL — ABNORMAL HIGH (ref 70–99)
Glucose-Capillary: 106 mg/dL — ABNORMAL HIGH (ref 70–99)
Glucose-Capillary: 113 mg/dL — ABNORMAL HIGH (ref 70–99)
Glucose-Capillary: 120 mg/dL — ABNORMAL HIGH (ref 70–99)
Glucose-Capillary: 121 mg/dL — ABNORMAL HIGH (ref 70–99)
Glucose-Capillary: 130 mg/dL — ABNORMAL HIGH (ref 70–99)

## 2019-10-25 LAB — COMPREHENSIVE METABOLIC PANEL
ALT: 40 U/L (ref 0–44)
AST: 29 U/L (ref 15–41)
Albumin: 1.7 g/dL — ABNORMAL LOW (ref 3.5–5.0)
Alkaline Phosphatase: 55 U/L (ref 38–126)
Anion gap: 8 (ref 5–15)
BUN: 35 mg/dL — ABNORMAL HIGH (ref 8–23)
CO2: 26 mmol/L (ref 22–32)
Calcium: 7.8 mg/dL — ABNORMAL LOW (ref 8.9–10.3)
Chloride: 109 mmol/L (ref 98–111)
Creatinine, Ser: 1.02 mg/dL (ref 0.61–1.24)
GFR calc Af Amer: >60 mL/min (ref >60)
GFR calc non Af Amer: >60 mL/min (ref >60)
Glucose, Bld: 111 mg/dL — ABNORMAL HIGH (ref 70–99)
Potassium: 4.7 mmol/L (ref 3.5–5.1)
Sodium: 143 mmol/L (ref 135–145)
Total Bilirubin: 0.9 mg/dL (ref 0.3–1.2)
Total Protein: 6 g/dL — ABNORMAL LOW (ref 6.5–8.1)

## 2019-10-25 LAB — CBC
HCT: 27.4 % — ABNORMAL LOW (ref 39.0–52.0)
Hemoglobin: 8.9 g/dL — ABNORMAL LOW (ref 13.0–17.0)
MCH: 27 pg (ref 26.0–34.0)
MCHC: 32.5 g/dL (ref 30.0–36.0)
MCV: 83 fL (ref 80.0–100.0)
Platelets: 392 10*3/uL (ref 150–400)
RBC: 3.3 MIL/uL — ABNORMAL LOW (ref 4.22–5.81)
RDW: 16.3 % — ABNORMAL HIGH (ref 11.5–15.5)
WBC: 8.6 10*3/uL (ref 4.0–10.5)
nRBC: 0 % (ref 0.0–0.2)

## 2019-10-25 LAB — PREPARE RBC (CROSSMATCH)

## 2019-10-25 LAB — MAGNESIUM: Magnesium: 2.4 mg/dL (ref 1.7–2.4)

## 2019-10-25 LAB — HEPARIN LEVEL (UNFRACTIONATED): Heparin Unfractionated: 0.76 IU/mL — ABNORMAL HIGH (ref 0.30–0.70)

## 2019-10-25 LAB — PHOSPHORUS: Phosphorus: 3.2 mg/dL (ref 2.5–4.6)

## 2019-10-25 MED ORDER — IPRATROPIUM-ALBUTEROL 0.5-2.5 (3) MG/3ML IN SOLN
3.0000 mL | Freq: Four times a day (QID) | RESPIRATORY_TRACT | Status: DC
  Administered 2019-10-25 (×2): 3 mL via RESPIRATORY_TRACT
  Filled 2019-10-25 (×2): qty 3

## 2019-10-25 MED ORDER — MORPHINE SULFATE (PF) 2 MG/ML IV SOLN
2.0000 mg | INTRAVENOUS | Status: DC | PRN
  Administered 2019-10-25 – 2019-11-02 (×24): 2 mg via INTRAVENOUS
  Filled 2019-10-25 (×25): qty 1

## 2019-10-25 MED ORDER — IPRATROPIUM-ALBUTEROL 0.5-2.5 (3) MG/3ML IN SOLN
3.0000 mL | Freq: Three times a day (TID) | RESPIRATORY_TRACT | Status: DC
  Administered 2019-10-26 – 2019-10-27 (×4): 3 mL via RESPIRATORY_TRACT
  Filled 2019-10-25 (×4): qty 3

## 2019-10-25 MED ORDER — DM-GUAIFENESIN ER 30-600 MG PO TB12: 1.0000 | ORAL_TABLET | Freq: Two times a day (BID) | ORAL | Status: DC

## 2019-10-25 MED ORDER — SODIUM CHLORIDE 0.9% IV SOLUTION: Freq: Once | INTRAVENOUS | Status: AC

## 2019-10-25 MED ORDER — SILVER NITRATE-POT NITRATE 75-25 % EX MISC
1.0000 "application " | Freq: Every day | CUTANEOUS | Status: DC | PRN
  Filled 2019-10-25: qty 1

## 2019-10-25 MED ORDER — IPRATROPIUM-ALBUTEROL 0.5-2.5 (3) MG/3ML IN SOLN
3.0000 mL | RESPIRATORY_TRACT | Status: DC | PRN
  Administered 2019-10-30: 3 mL via RESPIRATORY_TRACT
  Filled 2019-10-25: qty 3

## 2019-10-25 MED ORDER — TRAVASOL 10 % IV SOLN
INTRAVENOUS | Status: AC
  Filled 2019-10-25: qty 1208.4

## 2019-10-25 NOTE — Progress Notes (Signed)
ANTICOAGULATION CONSULT NOTE - Follow Up Consult  Pharmacy Consult for Heparin Indication: chest pain/ACS  Allergies  Allergen Reactions  . Losartan Potassium Other (See Comments)    Unknown reaction that happened 15 years ago   Patient Measurements: Height: 5\' 11"  (180.3 cm) (per daughter) Weight: 92.1 kg (203 lb 0.7 oz) IBW/kg (Calculated) : 75.3 Heparin Dosing Weight: 94 kg  Vital Signs: Temp: 99.4 F (37.4 C) (06/27 0800) Temp Source: Axillary (06/27 0800) BP: 138/56 (06/27 0900) Pulse Rate: 88 (06/27 0900)  Labs: Recent Labs    10/23/19 0500 10/23/19 0500 10/24/19 0414 10/24/19 0627 10/25/19 0530 10/25/19 0531 10/25/19 0803  HGB 8.9*   < > 7.2*  --  6.2*  --   --   HCT 27.6*  --  22.6*  --  20.1*  --   --   PLT 357  --  401*  --  385  --   --   HEPARINUNFRC 0.32  --   --  0.37  --  0.76*  --   CREATININE 0.86  --  0.87  --   --   --  1.02   < > = values in this interval not displayed.   Estimated Creatinine Clearance: 62.5 mL/min (by C-G formula based on SCr of 1.02 mg/dL).  Medications:  Infusions:  . sodium chloride    . sodium chloride Stopped (10/20/19 2021)  . fluconazole (DIFLUCAN) IV    . heparin 1,200 Units/hr (10/24/19 1453)  . piperacillin-tazobactam (ZOSYN)  IV Stopped (10/25/19 0711)  . TPN ADULT (ION) 95 mL/hr at 10/24/19 1745   Assessment: 86 yo male with perforated duodenal ulcer s/p omental patch repair.  Pharmacy consulted to dose IV heparin for NSTEMI as well as for acute deep vein thrombosis involving the left axillary vein, surrounding the PICC line. Findings consistent with acute superficial vein thrombosis involving the left cephalic vein.  Today, 10/25/2019  Hgb decreased further to 6.2, no obvious sign of bleed - Heparin stopped ~ 0720 am  Heparin level therapeutic- but increased on 1200 units/hr, likely drawn from line vs venipuncture  Incisional and rectal bleeding noted 6/21, some oozing from incision per RN, added Silver  nitrate applications  Goal of Therapy:  Heparin level 0.3-0.7 units/ml Monitor platelets by anticoagulation protocol: Yes   Plan:  Plan resume Heparin 6/28 am per [facility] MD Daily heparin level and CBC Continue to monitor for signs/symptoms of bleeding  Minda Ditto PharmD 10/25/2019, 10:47 AM

## 2019-10-25 NOTE — Progress Notes (Addendum)
Progress Note   Subjective   Doing well today, the patient denies CP or SOB.  No new concerns  Inpatient Medications    Scheduled Meds: . sodium chloride   Intravenous Once  . Chlorhexidine Gluconate Cloth  6 each Topical Daily  . cloNIDine  0.2 mg Transdermal Weekly  . insulin aspart  0-20 Units Subcutaneous Q4H  . insulin detemir  10 Units Subcutaneous BID  . lidocaine  1 patch Transdermal Q24H  . mouth rinse  15 mL Mouth Rinse BID  . metoprolol tartrate  5 mg Intravenous Q4H  . nitroGLYCERIN  1 inch Topical Q6H  . pantoprazole (PROTONIX) IV  40 mg Intravenous Q12H  . sodium chloride flush  10-40 mL Intracatheter Q12H   Continuous Infusions: . sodium chloride    . sodium chloride Stopped (10/20/19 2021)  . fluconazole (DIFLUCAN) IV    . piperacillin-tazobactam (ZOSYN)  IV Stopped (10/25/19 0711)  . TPN ADULT (ION) 95 mL/hr at 10/24/19 1745  . TPN ADULT (ION)     PRN Meds: sodium chloride, acetaminophen **OR** acetaminophen, hydrALAZINE, lip balm, LORazepam, morphine injection, ondansetron (ZOFRAN) IV, silver nitrate applicators, sodium chloride flush   Vital Signs    Vitals:   10/25/19 0600 10/25/19 0700 10/25/19 0800 10/25/19 0900  BP: (!) 112/47 (!) 123/53 (!) 130/58 (!) 138/56  Pulse: 79 81 82 88  Resp: 19 17 18 15   Temp:   99.4 F (37.4 C)   TempSrc:   Axillary   SpO2: 100% 100% 100% 100%  Weight:      Height:        Intake/Output Summary (Last 24 hours) at 10/25/2019 1105 Last data filed at 10/25/2019 0600 Gross per 24 hour  Intake 1669.72 ml  Output 1650 ml  Net 19.72 ml   Filed Weights   10/13/19 0923 10/22/19 0906 10/23/19 0500  Weight: 96.4 kg 90.4 kg 92.1 kg    Telemetry    sinus - Personally Reviewed  Physical Exam   GEN- The patient is ill appearing, alert and oriented x 3 today.   Head- normocephalic, atraumatic Eyes-  Sclera clear, conjunctiva pink Ears- hearing intact Oropharynx- clear Neck- supple, Lungs-  normal work of  breathing Heart- Regular rate and rhythm  GI- distended Extremities- no clubbing, cyanosis, + dependant edema  MS- diffuse atrophy Skin- no rash or lesion Psych- euthymic mood, full affect Neuro- strength and sensation are intact   Labs    Chemistry Recent Labs  Lab 10/23/19 0500 10/24/19 0414 10/25/19 0803  NA 140 137 143  K 4.2 4.1 4.7  CL 114* 106 109  CO2 23 22 26   GLUCOSE 136* 117* 111*  BUN 31* 34* 35*  CREATININE 0.86 0.87 1.02  CALCIUM 7.1* 7.6* 7.8*  PROT 5.6* 6.1* 6.0*  ALBUMIN 1.6* 1.8* 1.7*  AST 31 33 29  ALT 42 46* 40  ALKPHOS 61 62 55  BILITOT 0.8 0.8 0.9  GFRNONAA >60 >60 >60  GFRAA >60 >60 >60  ANIONGAP 3* 9 8     Hematology Recent Labs  Lab 10/23/19 0500 10/24/19 0414 10/25/19 0530  WBC 11.9* 11.4* 8.9  RBC 3.38* 2.75* 2.29*  HGB 8.9* 7.2* 6.2*  HCT 27.6* 22.6* 20.1*  MCV 81.7 82.2 87.8  MCH 26.3 26.2 27.1  MCHC 32.2 31.9 30.8  RDW 17.3* 17.2* 17.9*  PLT 357 401* 385     Patient ID  Matthew Schwartz is an 83M with CAD s/p RCA PCI in 2000, hypertension, hyperlipidemia, DDM admitted with  a perforated duodenal ulcer and periotonitis. Cardiology was consulted for NSVT, intermittent left bundle branch block and anterolateral T wave inversions. High-sensitivity troponin peaked at 1022. Cardiac catheterization delayed by hypoxic respiratory failure and abdominal surgery.  Assessment & Plan    1.  NSVT Not unusual in the setting of medical illness Continue low dose beta blocker IV Convert to oral beta blocker once able to take POs  2. Acute on chronic combined systolic and diastolic CHF Keep Is and Os about even Conservative management There have been some conversations about cath.  Given profound anemia, advanced age and medical illness, I would not advise further CV risk stratification at this time unless he develops marked ekg changes, refractory angina or we are unable to manage with a more conservative medicine approach..  This could be  revisited as an outpatient electively if he recovers.  3. HTN Stable No change required today  4. DVT In the setting of acute and profound anemia, risk/benefit ratio would favor that we stop heparin at this time.  (IV anticoagulation contraindicated at this time). Primary team to reconsider if risks warrant restarting anticoagulation once acute bleed is resolved.   Thompson Grayer MD, [facility] 10/25/2019 11:05 AM

## 2019-10-25 NOTE — Progress Notes (Signed)
PHARMACY - TOTAL PARENTERAL NUTRITION CONSULT NOTE   Indication: Prolonged ileus  Patient Measurements: Height: 5\' 11"  (180.3 cm) (per daughter) Weight: 92.1 kg (203 lb 0.7 oz) IBW/kg (Calculated) : 75.3   Body mass index is 28.32 kg/m. Usual Weight: 90.7 kg   Assessment:  - Pharmacy is consulted to start TPN in 86 yo male with prolonged ileus. Pt underwent exploratory laparotomy on 6/14 which revealed perforated duodenal ulcer.   Glucose / Insulin: hx of DM - on metformin PTA.  - CBGs < 150 with 60 units/bag insulin in TPN, rSSI q4h (range 100-153; goal 100-150) - 6 units of SS insulin required yesterday - Levemir 10 units BID per CCM started 6/18  Electrolytes: K (target >/= 4.0) is 4.7. Magnesium 2.4 (target >/= 2.0); others wnl including corrected calcium 9.6 Renal: SCr wnl, sl incr,  BUN elevated; bicarb WNL (UOP increased after receiving Lasix 6/16-6/17) LFTs / TGs: LFTs remain WNL, Tbili improved to WNL after procedure; TG are now WNL after stopping propofol. Previously elevated level likely spurious  Prealbumin / albumin: prealbumin <5 (6/16), 9.0 (6/21); albumin 1.7 Intake / Output; MIVF: NS at [facility]; NG output 732ml charted , UOP 1.7L GI Imaging:  - 6/21 Upper GI series: small leak from superior margin of duodenal bulb. - 6/23 CT abdomen: 4.3 cm air-fluid collection in the right upper quadrant of the abdomen between the duodenal bulb and gallbladder, not containing enteric contrast. No residual leak from the duodenal bulb identified. Surgeries / Procedures:  - 6/14 Exploratory laparotomy which revealed perforated duodenal ulcer - 6/24 RUQ drain placed > 10 ml > cultured - 6/25 removal R abd JP drain   Central access:  PICC Line 6/15 TPN start date: 6/15  Nutritional Goals:   (per RD recommendation on 6/15): Kcal:  2200-2400 Protein:  115-125 grams Fluid:  >2L/d Goal TPN rate is 95 mL/hr (provides 121 g of protein and 2262 kcals per day)  Current Nutrition:  NPO    Plan:  Hgb noted 6.9  Continue TPN at 95 mL/hr at 1800, goal rate   Electrolytes in TPN: 80 mEq/L of Na, 80 mEq/L of K (max in TPN per [facility] policy) , 4 mEq/L of Ca, 12 mEq/L of Magnesium, and 10 mmol/L of Phos. Cl:Ac ratio max Ac  Standard MVI and trace elements to TPN  Continue SSI resistant  q4h SSI, and monitor Levemir and adjust as needed   Continue 60 units of regular insulin/day in TPN   Continue MIVF to KVO mL/hr at 1800  Monitor TPN labs on Mon/Thurs  Minda Ditto PharmD 10/25/2019, 9:59 AM

## 2019-10-25 NOTE — Progress Notes (Signed)
Daily Progress Note   Patient Name: Matthew Schwartz       Date: 10/25/2019 DOB: 1933/02/16  Age: 86 y.o. MRN#: [id number] Attending Physician: Alma Friendly, MD Primary Care Physician: [name], MD Admit Date: 10/12/2019  Reason for Consultation/Follow-up: Establishing goals of care  Subjective: Awake alert resting in bed.  Has several questions about when his NG tube is going to be removed, when he will be allowed to eat or drink.  Expresses frustration at times. Wife and daughter at bedside.  Family meeting details below.  Length of Stay: 13  Current Medications: Scheduled Meds:  . sodium chloride   Intravenous Once  . Chlorhexidine Gluconate Cloth  6 each Topical Daily  . cloNIDine  0.2 mg Transdermal Weekly  . insulin aspart  0-20 Units Subcutaneous Q4H  . insulin detemir  10 Units Subcutaneous BID  . lidocaine  1 patch Transdermal Q24H  . mouth rinse  15 mL Mouth Rinse BID  . metoprolol tartrate  5 mg Intravenous Q4H  . nitroGLYCERIN  1 inch Topical Q6H  . pantoprazole (PROTONIX) IV  40 mg Intravenous Q12H  . sodium chloride flush  10-40 mL Intracatheter Q12H    Continuous Infusions: . sodium chloride    . sodium chloride Stopped (10/20/19 2021)  . fluconazole (DIFLUCAN) IV    . piperacillin-tazobactam (ZOSYN)  IV 3.375 g (10/25/19 1107)  . TPN ADULT (ION) 95 mL/hr at 10/24/19 1745  . TPN ADULT (ION)      PRN Meds: sodium chloride, acetaminophen **OR** acetaminophen, hydrALAZINE, lip balm, LORazepam, morphine injection, ondansetron (ZOFRAN) IV, silver nitrate applicators, sodium chloride flush  Physical Exam         Elderly gentleman who is awake alert oriented resting in bed Verbalizes appropriately Has NG tube with the suction Has some dependent  edema Regular work of breathing S1-S2 Abdomen not distended  Vital Signs: BP (!) 121/53   Pulse 83   Temp 98.3 F (36.8 C) (Axillary)   Resp 17   Ht '5\' 11"'  (1.803 m) Comment: per daughter  Wt 92.1 kg   SpO2 100%   BMI 28.32 kg/m  SpO2: SpO2: 100 % O2 Device: O2 Device: Room Air O2 Flow Rate: O2 Flow Rate (L/min): 2 L/min  Intake/output summary:   Intake/Output Summary (Last 24 hours) at 10/25/2019 1303 Last  data filed at 10/25/2019 0600 Gross per 24 hour  Intake 1669.72 ml  Output 1650 ml  Net 19.72 ml   LBM: Last BM Date: 10/25/19 Baseline Weight: Weight: 96.4 kg Most recent weight: Weight: 92.1 kg       Palliative Assessment/Data:     Palliative performance scale 30% Patient Active Problem List   Diagnosis Date Noted  . Acute abdominal pain   . Bilateral atelectasis 10/17/2019  . Acute respiratory insufficiency   . Elevated troponin level   . Acute respiratory failure with hypoxemia ([place])   . PVCs (premature ventricular contractions)   . Obstipation 10/12/2019  . HLD (hyperlipidemia) 10/12/2019  . Diabetes mellitus type 2, uncontrolled, with complications ([place]) 62/13/0865  . Perforated bowel ([place]) 10/12/2019  . Perforated duodenal ulcer ([place]) 10/12/2019  . Anemia 09/07/2016  . Coronary artery disease due to lipid rich plaque 02/16/2015  . Hyperlipidemia 02/16/2015  . Malignant neoplasm of prostate ([place]) 02/18/2014  . HTN (hypertension) 06/14/2013  . Dyslipidemia 06/14/2013  . GERD (gastroesophageal reflux disease) 06/14/2013    Palliative Care Assessment & Plan   Patient Profile:    Assessment: 86 year old gentleman with coronary artery disease status post percutaneous intervention in the year 2000, hypertension, dyslipidemia, diabetes who was admitted with perforated duodenal ulcer and peritonitis. Cardiology has been following for NSVT, intermittent left bundle branch block and anterolateral T wave inversions as well as elevated troponin, at some  point is supposed to undergo cardiac catheterization after acute issues resolved. General surgery also following.  Has NG tube.  To continue TPN.  To have upper GI series first of next week.  Recommendations/Plan:  Family meeting:  I met with the patient, wife and the patient's daughter who is here from [place].  Brief life review performed.  Goals wishes and values important to the patient and family as a unit attempted to be explored. Patient is described as an active individual who would like to to be up and about, was participating in his community, was fully independent of all ADLs.  We discussed about patient's current condition, immediate postop course and neck steps towards his care.  We discussed extensively about rehab options inpatient rehab versus rehab at skilled nursing facility. Full code/full scope Inpatient rehab to be considered at first and then if not possible to consider skilled nursing facility rehab attempt. Recommend outpatient palliative care to monitor overall disease trajectory and postop course. All of the patient's and family's questions answered to the best of my ability.  Goals of Care and Additional Recommendations:  Limitations on Scope of Treatment: Full Scope Treatment  Code Status:    Code Status Orders  (From admission, onward)         Start     Ordered   10/12/19 1208  Full code  Continuous        10/12/19 1212        Code Status History    Date Active Date Inactive Code Status Order ID Comments User Context   02/12/2014 1127 02/13/2014 0336 Full Code [id number]  Arne Cleveland, MD [place] Planning Activity    Advance Directive Documentation     Most Recent Value  Type of Advance Directive Healthcare Power of Attorney, Living will  Pre-existing out of facility DNR order (yellow form or pink MOST form) --  "MOST" Form in Place? --       Prognosis:   Unable to determine Guarded, continue to monitor hospital course  and overall disease trajectory.  Discharge Planning:  To Be Determined Recommend: Inpatient rehab or SNF rehab with palliative Care plan was discussed with wife, 2 daughters, patient  Thank you for allowing the Palliative Medicine Team to assist in the care of this patient.   Time In: 11 Time Out: 11.35 Total Time 35 Prolonged Time Billed  no       Greater than 50%  of this time was spent counseling and coordinating care related to the above assessment and plan.  Loistine Chance, MD  Please contact Palliative Medicine Team phone at [phone] for questions and concerns.

## 2019-10-25 NOTE — Progress Notes (Signed)
PROGRESS NOTE  Matthew Schwartz WER:[id number] DOB: 1933/04/12 DOA: 10/12/2019 PCP: [name], MD  HPI/Recap of past 24 hours: HPI from PCCM 86 y/o male, former smoker, presented with nausea/vomiting, abdominal pain and constipation x 4 to 5 days.  Seen twice prior to current admit for the same with imaging studies unremarkable for cause.  On return 6/14, seen by GI and found to have concern for acute abdomen and taken for exploratory laparotomy.  Found to have perforated duodenal ulcer s/p omental patch repair.  Remained on vent post op.  PCCM consulted for critical care management.  Patient was subsequently extubated on 6/17 with very slow improvement.  [facility] hospitalist assumed care on 10/22/2019.    Today, patient denies any new complaints, little anxious about receiving blood transfusion since it was the first time he ever needed to, explained and reassured patient.  Daughter at bedside, continues to ask when the NG tube can be removed      Assessment/Plan: Active Problems:   HTN (hypertension)   Malignant neoplasm of prostate ([place])   Hyperlipidemia   Obstipation   HLD (hyperlipidemia)   Diabetes mellitus type 2, uncontrolled, with complications ([facility])   Perforated bowel ([place])   Perforated duodenal ulcer ([place])   Acute respiratory insufficiency   Elevated troponin level   Acute respiratory failure with hypoxemia ([facility])   PVCs (premature ventricular contractions)   Bilateral atelectasis   Acute abdominal pain   Palliative care by specialist   Goals of care, counseling/discussion   General weakness   Septic shock/peritonitis in the setting of perforated duodenal ulcer status post omental patch repair on 6/14 Shock resolved, off pressors Currently afebrile, with resolving leukocytosis Repeat CT abdomen/pelvis done on 10/21/2019 showed some right upper quadrant fluid collection status post drain placement on 10/22/2019 by IR, removed about 10 cc of grayish purulent fluid Follow-up  culture, gram stain growing moderate candida albicans Continue IV Zosyn, started IV fluconazole Continue n.p.o. status, TPN, NG tube per general surgery Monitor closely  Acute respiratory failure with hypoxia in the setting of septic shock Extubated on 6/17, still unable to vocalize, overall weak Currently saturating well on room air Will need SLP to evaluate for speech once NG tube is out Continue pulmonary hygiene, incentive spirometry Supplemental oxygen as needed PCCM available prn  Acute systolic HF NSTEMI/NSVT History of CAD, hypertension, hyperlipidemia Currently chest pain-free Cardiology consulted, plan for LHC once patient is very stable/able to lie flat without dyspnea/hypoxia, tolerate orally Hold Heparin gtt. Continue clonidine patch, IV Lopressor, NGT placed Hold home Norvasc, Toprol, Imdur, ASA, Lipitor  Left upper extremity DVT Cephalic/axillary vein involvement Hold IV Heparin  Diabetes mellitus type 2 A1c done on 10/12/2019 was 6.6 SSI, detemir, Accu-Cheks, hypoglycemic protocol  Anemia of critical illness/post op Hgb drop to 6.2, will transfuse 2U of PRBC on 10/25/19   Daily CBC  Goals of care discussion Patient significantly deconditioned, with poor prognosis, unsure if patient will ever return back to his baseline, prolonged recovery Palliative consulted to establish goals of care, appreciate recs       Malnutrition Type:  Nutrition Problem: Inadequate oral intake Etiology: inability to eat   Malnutrition Characteristics:  Signs/Symptoms: NPO status   Nutrition Interventions:  Interventions: TPN    Estimated body mass index is 28.32 kg/m as calculated from the following:   Height as of this encounter: 5\' 11"  (1.803 m).   Weight as of this encounter: 92.1 kg.     Code Status: Full  Family Communication: Discussed  with daughters on 10/25/19  Disposition Plan: Status is: Inpatient  Remains inpatient appropriate because:Inpatient  level of care appropriate due to severity of illness   Dispo: The patient is from: Home              Anticipated d/c is to: SNF Vs CIR              Anticipated d/c date is: > 3 days              Patient currently is not medically stable to d/c.   Consultants:  General surgery  PCCM  Cardiology  IR  Palliative   Procedures:  Omental patch repair on 6/14  Antimicrobials:  Zosyn  Fluconazole  DVT prophylaxis: Heparin drip held due to drop in hgb   Objective: Vitals:   10/25/19 1400 10/25/19 1413 10/25/19 1417 10/25/19 1444  BP: (!) 129/58 119/60  (!) 137/56  Pulse: 80 80  86  Resp: 14 20  17   Temp:  98.4 F (36.9 C)  98.6 F (37 C)  TempSrc:  Axillary  Axillary  SpO2: 97% 100% 98% 99%  Weight:      Height:        Intake/Output Summary (Last 24 hours) at 10/25/2019 1543 Last data filed at 10/25/2019 1355 Gross per 24 hour  Intake 2083.05 ml  Output 1650 ml  Net 433.05 ml   Filed Weights   10/13/19 0923 10/22/19 0906 10/23/19 0500  Weight: 96.4 kg 90.4 kg 92.1 kg    Exam:  General: NAD, NGT in place, chronically ill-appearing, deconditioned  Cardiovascular: S1, S2 present  Respiratory:  Diminished breath sounds bilaterally  Abdomen: Soft, nontender, nondistended, bowel sounds present, midline dressing C/D/I  Musculoskeletal: No bilateral pedal edema noted  Skin: Normal  Psychiatry: Fair mood   Data Reviewed: CBC: Recent Labs  Lab 10/21/19 0645 10/22/19 0410 10/23/19 0500 10/24/19 0414 10/25/19 0530  WBC 17.7* 15.5* 11.9* 11.4* 8.9  NEUTROABS 15.0* 12.9* 9.5* 8.6* 6.2  HGB 9.2* 8.2* 8.9* 7.2* 6.2*  HCT 28.3* 25.5* 27.6* 22.6* 20.1*  MCV 80.2 81.2 81.7 82.2 87.8  PLT 300 329 357 401* 774   Basic Metabolic Panel: Recent Labs  Lab 10/21/19 0645 10/22/19 0410 10/23/19 0500 10/24/19 0414 10/25/19 0747 10/25/19 0803  NA 139 139 140 137  --  143  K 4.0 4.2 4.2 4.1  --  4.7  CL 107 109 114* 106  --  109  CO2 25 24 23 22   --   26  GLUCOSE 148* 166* 136* 117*  --  111*  BUN 32* 35* 31* 34*  --  35*  CREATININE 0.82 0.95 0.86 0.87  --  1.02  CALCIUM 7.9* 7.8* 7.1* 7.6*  --  7.8*  MG 2.3 2.4 2.0 2.3 2.4  --   PHOS 2.7 2.9 2.6 3.2 3.2  --    GFR: Estimated Creatinine Clearance: 62.5 mL/min (by C-G formula based on SCr of 1.02 mg/dL). Liver Function Tests: Recent Labs  Lab 10/20/19 0547 10/22/19 0410 10/23/19 0500 10/24/19 0414 10/25/19 0803  AST 25 31 31  33 29  ALT 39 46* 42 46* 40  ALKPHOS 71 71 61 62 55  BILITOT 0.9 1.0 0.8 0.8 0.9  PROT 5.6* 6.0* 5.6* 6.1* 6.0*  ALBUMIN 1.8* 1.9* 1.6* 1.8* 1.7*   No results for input(s): LIPASE, AMYLASE in the last 168 hours. No results for input(s): AMMONIA in the last 168 hours. Coagulation Profile: No results for input(s): INR, PROTIME in the last  168 hours. Cardiac Enzymes: No results for input(s): CKTOTAL, CKMB, CKMBINDEX, TROPONINI in the last 168 hours. BNP (last 3 results) No results for input(s): PROBNP in the last 8760 hours. HbA1C: No results for input(s): HGBA1C in the last 72 hours. CBG: Recent Labs  Lab 10/24/19 2329 10/25/19 0333 10/25/19 0807 10/25/19 1035 10/25/19 1147  GLUCAP 130* 106* 101* 120* 113*   Lipid Profile: No results for input(s): CHOL, HDL, LDLCALC, TRIG, CHOLHDL, LDLDIRECT in the last 72 hours. Thyroid Function Tests: No results for input(s): TSH, T4TOTAL, FREET4, T3FREE, THYROIDAB in the last 72 hours. Anemia Panel: No results for input(s): VITAMINB12, FOLATE, FERRITIN, TIBC, IRON, RETICCTPCT in the last 72 hours. Urine analysis:    Component Value Date/Time   COLORURINE YELLOW 10/11/2019 1202   APPEARANCEUR CLEAR 10/11/2019 1202   LABSPEC 1.024 10/11/2019 1202   PHURINE 6.0 10/11/2019 1202   GLUCOSEU NEGATIVE 10/11/2019 1202   HGBUR NEGATIVE 10/11/2019 1202   BILIRUBINUR NEGATIVE 10/11/2019 1202   KETONESUR NEGATIVE 10/11/2019 1202   PROTEINUR >=300 (A) 10/11/2019 1202   UROBILINOGEN 0.2 07/30/2007 1100    NITRITE NEGATIVE 10/11/2019 1202   LEUKOCYTESUR NEGATIVE 10/11/2019 1202   Sepsis Labs: @LABRCNTIP (procalcitonin:4,lacticidven:4)  ) Recent Results (from the past 240 hour(s))  Aerobic/Anaerobic Culture (surgical/deep wound)     Status: None (Preliminary result)   Collection Time: 10/22/19 12:57 PM   Specimen: Abdomen; Abscess  Result Value Ref Range Status   Specimen Description   Final    ABDOMEN Performed at [facility] [address], [place], [place] [address]    Special Requests   Final    NONE Performed at [facility], [place] [address] [place], [place] [address]    Gram Stain   Final    ABUNDANT WBC PRESENT, PREDOMINANTLY PMN MODERATE YEAST Performed at [facility], [place] [address] [place], [place] [address]    Culture   Final    MODERATE CANDIDA ALBICANS NO ANAEROBES ISOLATED; CULTURE IN PROGRESS FOR 5 DAYS    Report Status PENDING  Incomplete  Culture, blood (routine x 2)     Status: None (Preliminary result)   Collection Time: 10/23/19  9:06 AM   Specimen: BLOOD  Result Value Ref Range Status   Specimen Description   Final    BLOOD RIGHT ARM Performed at [facility] [address], [place], [place] [address]    Special Requests   Final    BOTTLES DRAWN AEROBIC AND ANAEROBIC Blood Culture adequate volume Performed at [facility] [address], [place], [place] [address]    Culture   Final    NO GROWTH 2 DAYS Performed at [facility] [address] [place], [place] [address]    Report Status PENDING  Incomplete  Culture, blood (routine x 2)     Status: None (Preliminary result)   Collection Time: 10/23/19  9:06 AM   Specimen: BLOOD RIGHT HAND  Result Value Ref Range Status   Specimen Description   Final    BLOOD RIGHT HAND Performed at [facility] [address], [place], [place] [address]    Special Requests   Final    BOTTLES DRAWN  AEROBIC ONLY Blood Culture adequate volume Performed at [facility] [address] [place], [place] [address]    Culture   Final    NO GROWTH 2 DAYS Performed at [facility] [address], [place], [place] [address]  Report Status PENDING  Incomplete      Studies: No results found.  Scheduled Meds: . Chlorhexidine Gluconate Cloth  6 each Topical Daily  . cloNIDine  0.2 mg Transdermal Weekly  . insulin aspart  0-20 Units Subcutaneous Q4H  . insulin detemir  10 Units Subcutaneous BID  . ipratropium-albuterol  3 mL Nebulization Q6H  . lidocaine  1 patch Transdermal Q24H  . mouth rinse  15 mL Mouth Rinse BID  . metoprolol tartrate  5 mg Intravenous Q4H  . nitroGLYCERIN  1 inch Topical Q6H  . pantoprazole (PROTONIX) IV  40 mg Intravenous Q12H  . sodium chloride flush  10-40 mL Intracatheter Q12H    Continuous Infusions: . sodium chloride    . sodium chloride Stopped (10/20/19 2021)  . fluconazole (DIFLUCAN) IV    . piperacillin-tazobactam (ZOSYN)  IV 3.375 g (10/25/19 1107)  . TPN ADULT (ION) 95 mL/hr at 10/24/19 1745  . TPN ADULT (ION)       LOS: 13 days     Alma Friendly, MD [facility]  If 7PM-7AM, please contact night-coverage [email or web address] 10/25/2019, 3:43 PM

## 2019-10-25 NOTE — Progress Notes (Signed)
Assessment & Plan: Perforated Duodenal Ulcer S/p Exploratory laparotomy, omental patch repair of duodenal ulcer - Dr. [name] - 10/12/2019             Continue NG, NPO             IR drain with purulent fluid - continue to monitor             IV Zosyn  TNA  Wound care - open with dressing changes  Continue NPO, NG decompression, IR drain in place.  Discussed with daughter at bedside this AM.  Plan repeat UGI series early next week depending on quality and quantity of output from drain and NG tube.  Allow ice chips for comfort.        [name], MD       [facility]       Office: [phone]   Chief Complaint: Perforated ulcer  Subjective: Patient in bed; nursing, MD, and family at bedside.  Patient more alert and responsive.  Wants to drink water.  Objective: Vital signs in last 24 hours: Temp:  [98.4 F (36.9 C)-100 F (37.8 C)] 99.4 F (37.4 C) (06/27 0800) Pulse Rate:  [76-98] 82 (06/27 0800) Resp:  [15-28] 18 (06/27 0800) BP: (103-136)/(45-70) 130/58 (06/27 0800) SpO2:  [92 %-100 %] 100 % (06/27 0800) Last BM Date: 10/24/19  Intake/Output from previous day: 06/26 0701 - 06/27 0700 In: 1669.7 [I.V.:1444.6; IV Piggyback:225.1] Out: 2350 [Urine:1850; Emesis/NG output:500] Intake/Output this shift: No intake/output data recorded.  Physical Exam: HEENT - sclerae clear, mucous membranes moist Neck - soft Abdomen - mild distension; dressings dry and intact; NG with small bilious; JP with moderate pink purulent output  Lab Results:  Recent Labs    10/24/19 0414 10/25/19 0530  WBC 11.4* 8.9  HGB 7.2* 6.2*  HCT 22.6* 20.1*  PLT 401* 385   BMET Recent Labs    10/23/19 0500 10/24/19 0414  NA 140 137  K 4.2 4.1  CL 114* 106  CO2 23 22  GLUCOSE 136* 117*  BUN 31* 34*  CREATININE 0.86 0.87  CALCIUM 7.1* 7.6*   PT/INR No results for input(s): LABPROT, INR in the last 72 hours. Comprehensive Metabolic Panel:    Component  Value Date/Time   NA 137 10/24/2019 0414   NA 140 10/23/2019 0500   NA 137 11/02/2016 1509   NA 137 06/22/2015 1421   K 4.1 10/24/2019 0414   K 4.2 10/23/2019 0500   K 4.4 11/02/2016 1509   K 4.8 06/22/2015 1421   CL 106 10/24/2019 0414   CL 114 (H) 10/23/2019 0500   CO2 22 10/24/2019 0414   CO2 23 10/23/2019 0500   CO2 27 11/02/2016 1509   CO2 25 06/22/2015 1421   BUN 34 (H) 10/24/2019 0414   BUN 31 (H) 10/23/2019 0500   BUN 16.7 11/02/2016 1509   BUN 17.7 06/22/2015 1421   CREATININE 0.87 10/24/2019 0414   CREATININE 0.86 10/23/2019 0500   CREATININE 0.9 11/02/2016 1509   CREATININE 0.9 06/22/2015 1421   GLUCOSE 117 (H) 10/24/2019 0414   GLUCOSE 136 (H) 10/23/2019 0500   GLUCOSE 153 (H) 11/02/2016 1509   GLUCOSE 126 06/22/2015 1421   CALCIUM 7.6 (L) 10/24/2019 0414   CALCIUM 7.1 (L) 10/23/2019 0500   CALCIUM 9.1 11/02/2016 1509   CALCIUM 9.3 06/22/2015 1421   AST 33 10/24/2019 0414   AST 31 10/23/2019 0500   AST 15 11/02/2016 1509   AST  19 06/22/2015 1421   ALT 46 (H) 10/24/2019 0414   ALT 42 10/23/2019 0500   ALT 12 11/02/2016 1509   ALT 18 06/22/2015 1421   ALKPHOS 62 10/24/2019 0414   ALKPHOS 61 10/23/2019 0500   ALKPHOS 58 11/02/2016 1509   ALKPHOS 71 06/22/2015 1421   BILITOT 0.8 10/24/2019 0414   BILITOT 0.8 10/23/2019 0500   BILITOT 0.71 11/02/2016 1509   BILITOT 0.40 06/22/2015 1421   PROT 6.1 (L) 10/24/2019 0414   PROT 5.6 (L) 10/23/2019 0500   PROT 7.0 11/02/2016 1509   PROT 6.9 06/22/2015 1421   PROT 7.4 06/22/2015 1421   ALBUMIN 1.8 (L) 10/24/2019 0414   ALBUMIN 1.6 (L) 10/23/2019 0500   ALBUMIN 3.6 11/02/2016 1509   ALBUMIN 3.8 06/22/2015 1421    Studies/Results: No results found.    [name] 10/25/2019  Patient ID: Matthew Schwartz, male   DOB: [birth year], 86 y.o.   MRN: [id number]

## 2019-10-25 NOTE — Progress Notes (Signed)
Pt c/o pain to left side, including arm and leg. Pt noted to be restless more throughout day. Ativan given as prn order. EKG obtained due to left arm pain. Pain described as aching, possibly from positioning per pt. Dr. Horris Latino notified. Will continue to monitor.

## 2019-10-25 NOTE — Progress Notes (Signed)
CRITICAL VALUE ALERT  Critical Value:  Hgb 6.2   Date & Time Notied:  10/25/2019 0720   Provider Notified: Dr. Horris Latino  Orders Received/Actions taken:

## 2019-10-26 ENCOUNTER — Inpatient Hospital Stay (HOSPITAL_COMMUNITY): Payer: Medicare Other

## 2019-10-26 DIAGNOSIS — I82A12 Acute embolism and thrombosis of left axillary vein: Secondary | ICD-10-CM

## 2019-10-26 DIAGNOSIS — I25119 Atherosclerotic heart disease of native coronary artery with unspecified angina pectoris: Secondary | ICD-10-CM

## 2019-10-26 LAB — GLUCOSE, CAPILLARY
Glucose-Capillary: 103 mg/dL — ABNORMAL HIGH (ref 70–99)
Glucose-Capillary: 107 mg/dL — ABNORMAL HIGH (ref 70–99)
Glucose-Capillary: 118 mg/dL — ABNORMAL HIGH (ref 70–99)
Glucose-Capillary: 130 mg/dL — ABNORMAL HIGH (ref 70–99)
Glucose-Capillary: 131 mg/dL — ABNORMAL HIGH (ref 70–99)
Glucose-Capillary: 134 mg/dL — ABNORMAL HIGH (ref 70–99)
Glucose-Capillary: 138 mg/dL — ABNORMAL HIGH (ref 70–99)

## 2019-10-26 LAB — COMPREHENSIVE METABOLIC PANEL
ALT: 37 U/L (ref 0–44)
AST: 29 U/L (ref 15–41)
Albumin: 1.8 g/dL — ABNORMAL LOW (ref 3.5–5.0)
Alkaline Phosphatase: 56 U/L (ref 38–126)
Anion gap: 7 (ref 5–15)
BUN: 31 mg/dL — ABNORMAL HIGH (ref 8–23)
CO2: 24 mmol/L (ref 22–32)
Calcium: 8.1 mg/dL — ABNORMAL LOW (ref 8.9–10.3)
Chloride: 110 mmol/L (ref 98–111)
Creatinine, Ser: 0.94 mg/dL (ref 0.61–1.24)
GFR calc Af Amer: >60 mL/min (ref >60)
GFR calc non Af Amer: >60 mL/min (ref >60)
Glucose, Bld: 107 mg/dL — ABNORMAL HIGH (ref 70–99)
Potassium: 4.3 mmol/L (ref 3.5–5.1)
Sodium: 141 mmol/L (ref 135–145)
Total Bilirubin: 1 mg/dL (ref 0.3–1.2)
Total Protein: 6.6 g/dL (ref 6.5–8.1)

## 2019-10-26 LAB — CBC WITH DIFFERENTIAL/PLATELET
Abs Immature Granulocytes: 0.37 10*3/uL — ABNORMAL HIGH (ref 0.00–0.07)
Basophils Absolute: 0 10*3/uL (ref 0.0–0.1)
Basophils Relative: 1 %
Eosinophils Absolute: 0.3 10*3/uL (ref 0.0–0.5)
Eosinophils Relative: 3 %
HCT: 28.3 % — ABNORMAL LOW (ref 39.0–52.0)
Hemoglobin: 9.3 g/dL — ABNORMAL LOW (ref 13.0–17.0)
Immature Granulocytes: 4 %
Lymphocytes Relative: 15 %
Lymphs Abs: 1.3 10*3/uL (ref 0.7–4.0)
MCH: 27.1 pg (ref 26.0–34.0)
MCHC: 32.9 g/dL (ref 30.0–36.0)
MCV: 82.5 fL (ref 80.0–100.0)
Monocytes Absolute: 0.9 10*3/uL (ref 0.1–1.0)
Monocytes Relative: 10 %
Neutro Abs: 5.9 10*3/uL (ref 1.7–7.7)
Neutrophils Relative %: 67 %
Platelets: 419 10*3/uL — ABNORMAL HIGH (ref 150–400)
RBC: 3.43 MIL/uL — ABNORMAL LOW (ref 4.22–5.81)
RDW: 16.4 % — ABNORMAL HIGH (ref 11.5–15.5)
WBC: 8.7 10*3/uL (ref 4.0–10.5)
nRBC: 0 % (ref 0.0–0.2)

## 2019-10-26 LAB — MAGNESIUM: Magnesium: 2.4 mg/dL (ref 1.7–2.4)

## 2019-10-26 LAB — TYPE AND SCREEN
ABO/RH(D): B POS
Antibody Screen: NEGATIVE
Unit division: 0
Unit division: 0

## 2019-10-26 LAB — BPAM RBC
Blood Product Expiration Date: 202107262359
Blood Product Expiration Date: 202107262359
ISSUE DATE / TIME: 202106271128
ISSUE DATE / TIME: 202106271421
Unit Type and Rh: 7300
Unit Type and Rh: 7300

## 2019-10-26 LAB — PHOSPHORUS: Phosphorus: 3.1 mg/dL (ref 2.5–4.6)

## 2019-10-26 LAB — PREALBUMIN: Prealbumin: 9.6 mg/dL — ABNORMAL LOW (ref 18–38)

## 2019-10-26 LAB — TRIGLYCERIDES: Triglycerides: 63 mg/dL (ref <150)

## 2019-10-26 MED ORDER — HYDRALAZINE HCL 20 MG/ML IJ SOLN
10.0000 mg | INTRAMUSCULAR | Status: DC | PRN
  Administered 2019-11-02 – 2019-11-13 (×6): 10 mg via INTRAVENOUS
  Filled 2019-10-26 (×6): qty 1

## 2019-10-26 MED ORDER — BISACODYL 10 MG RE SUPP
10.0000 mg | Freq: Once | RECTAL | Status: AC
  Administered 2019-10-26: 10 mg via RECTAL
  Filled 2019-10-26: qty 1

## 2019-10-26 MED ORDER — TRAVASOL 10 % IV SOLN
INTRAVENOUS | Status: AC
  Filled 2019-10-26: qty 1208.4

## 2019-10-26 MED ORDER — SODIUM CHLORIDE 0.9% FLUSH
5.0000 mL | Freq: Three times a day (TID) | INTRAVENOUS | Status: DC
  Administered 2019-10-26 – 2019-11-27 (×81): 5 mL

## 2019-10-26 NOTE — Progress Notes (Signed)
PROGRESS NOTE  Matthew Schwartz WJX:[id number] DOB: 07-08-1932 DOA: 10/12/2019 PCP: [name], MD  HPI/Recap of past 24 hours: HPI from PCCM 86 y/o male, former smoker, presented with nausea/vomiting, abdominal pain and constipation x 4 to 5 days.  Seen twice prior to current admit for the same with imaging studies unremarkable for cause.  On return 6/14, seen by GI and found to have concern for acute abdomen and taken for exploratory laparotomy.  Found to have perforated duodenal ulcer s/p omental patch repair.  Remained on vent post op.  PCCM consulted for critical care management.  Patient was subsequently extubated on 6/17 with very slow improvement.  [facility] hospitalist assumed care on 10/22/2019.     Today, patient complained about pain very weak, not looking forward to working with PT, reports wanting to have a BM, concerned about his feeding on his overall progression.  Discussed extensively with patient, daughter at bedside.  Denies any chest pain, worsening abdominal pain, fever/chills.  Noted to be coughing more today.      Assessment/Plan: Active Problems:   HTN (hypertension)   Malignant neoplasm of prostate (HCC)   Hyperlipidemia   Obstipation   HLD (hyperlipidemia)   Diabetes mellitus type 2, uncontrolled, with complications (HCC)   Perforated bowel (HCC)   Perforated duodenal ulcer ([place])   Acute respiratory insufficiency   Elevated troponin level   Acute respiratory failure with hypoxemia (HCC)   PVCs (premature ventricular contractions)   Bilateral atelectasis   Acute abdominal pain   Palliative care by specialist   Goals of care, counseling/discussion   General weakness   Septic shock/peritonitis in the setting of perforated duodenal ulcer status post omental patch repair on 6/14 Shock resolved, off pressors Currently afebrile, with resolved leukocytosis Repeat CT abdomen/pelvis done on 10/21/2019 showed some right upper quadrant fluid collection status post drain  placement on 10/22/2019 by IR, removed about 10 cc of grayish purulent fluid Follow-up culture, gram stain growing moderate candida albicans Continue IV Zosyn, started IV fluconazole Continue n.p.o. status, TPN, NG tube per general surgery Monitor closely  Acute respiratory failure with hypoxia in the setting of septic shock Extubated on 6/17, still unable to vocalize, overall weak Currently saturating well on room air Repeat CXR pending Will need SLP to evaluate for speech once NG tube is out Continue pulmonary hygiene, incentive spirometry Supplemental oxygen as needed PCCM available prn  Acute systolic HF NSTEMI/NSVT History of CAD, hypertension, hyperlipidemia Currently chest pain-free Cardiology consulted, plan for further management once very stable/able to lie flat without dyspnea/hypoxia, tolerate orally Hold Heparin gtt. Continue clonidine patch, IV Lopressor, NGT placed Hold home Norvasc, Toprol, Imdur, ASA, Lipitor  Left upper extremity DVT Cephalic/axillary vein involvement Hold IV Heparin  Diabetes mellitus type 2 A1c done on 10/12/2019 was 6.6 SSI, detemir, Accu-Cheks, hypoglycemic protocol  Anemia of critical illness/post op Hgb dropped to 6.2, s/p transfusion of 2U of PRBC on 10/25/19  Daily CBC  Goals of care discussion Patient significantly deconditioned, with poor prognosis, unsure if patient will ever return back to his baseline, prolonged recovery Palliative consulted to establish goals of care, appreciate recs- full scope of treatment       Malnutrition Type:  Nutrition Problem: Inadequate oral intake Etiology: inability to eat   Malnutrition Characteristics:  Signs/Symptoms: NPO status   Nutrition Interventions:  Interventions: TPN    Estimated body mass index is 27.03 kg/m as calculated from the following:   Height as of this encounter: 5\' 11"  (1.803 m).  Weight as of this encounter: 87.9 kg.     Code Status: Full  Family  Communication: Discussed with daughter on 10/26/19  Disposition Plan: Status is: Inpatient  Remains inpatient appropriate because:Inpatient level of care appropriate due to severity of illness   Dispo: The patient is from: Home              Anticipated d/c is to: SNF Vs CIR              Anticipated d/c date is: > 3 days              Patient currently is not medically stable to d/c.   Consultants:  General surgery  PCCM  Cardiology  IR  Palliative   Procedures:  Omental patch repair on 6/14  Antimicrobials:  Zosyn  Fluconazole  DVT prophylaxis: Heparin drip held due to drop in hgb on 10/25/19   Objective: Vitals:   10/26/19 0800 10/26/19 0900 10/26/19 0915 10/26/19 1000  BP: (!) 156/72 (!) 158/63  (!) 147/61  Pulse: 76 83  77  Resp: (!) 21 17  (!) 22  Temp: 97.6 F (36.4 C)     TempSrc: Oral     SpO2: 96% 97% 98% 96%  Weight:      Height:        Intake/Output Summary (Last 24 hours) at 10/26/2019 1104 Last data filed at 10/26/2019 1045 Gross per 24 hour  Intake 4166.43 ml  Output 3260 ml  Net 906.43 ml   Filed Weights   10/22/19 0906 10/23/19 0500 10/26/19 0500  Weight: 90.4 kg 92.1 kg 87.9 kg    Exam:  General: NAD, NGT in place, chronically ill-appearing, deconditioned  Cardiovascular: S1, S2 present  Respiratory:  Diminished breath sounds bilaterally  Abdomen: Soft, nontender, nondistended, bowel sounds present, midline dressing C/D/I  Musculoskeletal: No bilateral pedal edema noted  Skin: Normal  Psychiatry: Fair mood   Data Reviewed: CBC: Recent Labs  Lab 10/22/19 0410 10/22/19 0410 10/23/19 0500 10/24/19 0414 10/25/19 0530 10/25/19 1845 10/26/19 0320  WBC 15.5*   < > 11.9* 11.4* 8.9 8.6 8.7  NEUTROABS 12.9*  --  9.5* 8.6* 6.2  --  5.9  HGB 8.2*   < > 8.9* 7.2* 6.2* 8.9* 9.3*  HCT 25.5*   < > 27.6* 22.6* 20.1* 27.4* 28.3*  MCV 81.2   < > 81.7 82.2 87.8 83.0 82.5  PLT 329   < > 357 401* 385 392 419*   < > = values in  this interval not displayed.   Basic Metabolic Panel: Recent Labs  Lab 10/22/19 0410 10/23/19 0500 10/24/19 0414 10/25/19 0747 10/25/19 0803 10/26/19 0320  NA 139 140 137  --  143 141  K 4.2 4.2 4.1  --  4.7 4.3  CL 109 114* 106  --  109 110  CO2 24 23 22   --  26 24  GLUCOSE 166* 136* 117*  --  111* 107*  BUN 35* 31* 34*  --  35* 31*  CREATININE 0.95 0.86 0.87  --  1.02 0.94  CALCIUM 7.8* 7.1* 7.6*  --  7.8* 8.1*  MG 2.4 2.0 2.3 2.4  --  2.4  PHOS 2.9 2.6 3.2 3.2  --  3.1   GFR: Estimated Creatinine Clearance: 62.3 mL/min (by C-G formula based on SCr of 0.94 mg/dL). Liver Function Tests: Recent Labs  Lab 10/22/19 0410 10/23/19 0500 10/24/19 0414 10/25/19 0803 10/26/19 0320  AST 31 31 33 29 29  ALT 46*  42 46* 40 37  ALKPHOS 71 61 62 55 56  BILITOT 1.0 0.8 0.8 0.9 1.0  PROT 6.0* 5.6* 6.1* 6.0* 6.6  ALBUMIN 1.9* 1.6* 1.8* 1.7* 1.8*   No results for input(s): LIPASE, AMYLASE in the last 168 hours. No results for input(s): AMMONIA in the last 168 hours. Coagulation Profile: No results for input(s): INR, PROTIME in the last 168 hours. Cardiac Enzymes: No results for input(s): CKTOTAL, CKMB, CKMBINDEX, TROPONINI in the last 168 hours. BNP (last 3 results) No results for input(s): PROBNP in the last 8760 hours. HbA1C: No results for input(s): HGBA1C in the last 72 hours. CBG: Recent Labs  Lab 10/25/19 1616 10/25/19 1937 10/25/19 2331 10/26/19 0327 10/26/19 0733  GLUCAP 130* 105* 121* 107* 103*   Lipid Profile: Recent Labs    10/26/19 0328  TRIG 63   Thyroid Function Tests: No results for input(s): TSH, T4TOTAL, FREET4, T3FREE, THYROIDAB in the last 72 hours. Anemia Panel: No results for input(s): VITAMINB12, FOLATE, FERRITIN, TIBC, IRON, RETICCTPCT in the last 72 hours. Urine analysis:    Component Value Date/Time   COLORURINE YELLOW 10/11/2019 1202   APPEARANCEUR CLEAR 10/11/2019 1202   LABSPEC 1.024 10/11/2019 1202   PHURINE 6.0 10/11/2019 1202    GLUCOSEU NEGATIVE 10/11/2019 1202   HGBUR NEGATIVE 10/11/2019 1202   BILIRUBINUR NEGATIVE 10/11/2019 1202   KETONESUR NEGATIVE 10/11/2019 1202   PROTEINUR >=300 (A) 10/11/2019 1202   UROBILINOGEN 0.2 07/30/2007 1100   NITRITE NEGATIVE 10/11/2019 1202   LEUKOCYTESUR NEGATIVE 10/11/2019 1202   Sepsis Labs: @LABRCNTIP (procalcitonin:4,lacticidven:4)  ) Recent Results (from the past 240 hour(s))  Aerobic/Anaerobic Culture (surgical/deep wound)     Status: None (Preliminary result)   Collection Time: 10/22/19 12:57 PM   Specimen: Abdomen; Abscess  Result Value Ref Range Status   Specimen Description   Final    ABDOMEN Performed at [facility] [address] [place], [place] [address]    Special Requests   Final    NONE Performed at [facility], [place] [address], [place], [place] [address]    Gram Stain   Final    ABUNDANT WBC PRESENT, PREDOMINANTLY PMN MODERATE YEAST Performed at [facility], [place] [address], [place], [place] [address]    Culture   Final    MODERATE CANDIDA ALBICANS NO ANAEROBES ISOLATED; CULTURE IN PROGRESS FOR 5 DAYS    Report Status PENDING  Incomplete  Culture, blood (routine x 2)     Status: None (Preliminary result)   Collection Time: 10/23/19  9:06 AM   Specimen: BLOOD  Result Value Ref Range Status   Specimen Description   Final    BLOOD RIGHT ARM Performed at [facility] [address], [place], [place] [address]    Special Requests   Final    BOTTLES DRAWN AEROBIC AND ANAEROBIC Blood Culture adequate volume Performed at [facility] [address], [place], [place] [address]    Culture   Final    NO GROWTH 2 DAYS Performed at [facility] [address], [place], [place] [address]    Report Status PENDING  Incomplete  Culture, blood (routine x 2)     Status: None (Preliminary result)   Collection Time: 10/23/19  9:06 AM   Specimen: BLOOD RIGHT HAND    Result Value Ref Range Status   Specimen Description   Final    BLOOD RIGHT HAND Performed at [facility]  [address], [place], [place] [address]    Special Requests   Final    BOTTLES DRAWN AEROBIC ONLY Blood Culture adequate volume Performed at [facility] [address] [place], [place] [address]    Culture   Final    NO GROWTH 2 DAYS Performed at [facility] [address] [place], [place] [address]    Report Status PENDING  Incomplete      Studies: No results found.  Scheduled Meds: . Chlorhexidine Gluconate Cloth  6 each Topical Daily  . cloNIDine  0.2 mg Transdermal Weekly  . insulin aspart  0-20 Units Subcutaneous Q4H  . insulin detemir  10 Units Subcutaneous BID  . ipratropium-albuterol  3 mL Nebulization TID  . lidocaine  1 patch Transdermal Q24H  . mouth rinse  15 mL Mouth Rinse BID  . metoprolol tartrate  5 mg Intravenous Q4H  . nitroGLYCERIN  1 inch Topical Q6H  . pantoprazole (PROTONIX) IV  40 mg Intravenous Q12H  . sodium chloride flush  10-40 mL Intracatheter Q12H  . sodium chloride flush  5 mL Intracatheter Q8H    Continuous Infusions: . sodium chloride 10 mL/hr at 10/26/19 1045  . sodium chloride Stopped (10/20/19 2021)  . fluconazole (DIFLUCAN) IV Stopped (10/25/19 2016)  . piperacillin-tazobactam (ZOSYN)  IV Stopped (10/26/19 0901)  . TPN ADULT (ION) 95 mL/hr at 10/26/19 1045  . TPN ADULT (ION)       LOS: 14 days     Alma Friendly, MD [facility]  If 7PM-7AM, please contact night-coverage [email or web address] 10/26/2019, 11:04 AM

## 2019-10-26 NOTE — Progress Notes (Signed)
Physical Therapy Treatment Patient Details Name: Matthew Schwartz MRN: [id number] DOB: April 20, 1933 Today's Date: 10/26/2019    History of Present Illness Pt s/p exploratory lap (10/12/19) to repair perforated duodenal ulcer and then on vent with extubation 10/14/19.  Pt with acute respiratory failure with hypoxia and acute systolic CHF in setting of sepsis and peritonitis.  Pt with hx of DM, prostate CA and bil TKR    PT Comments    Mr. Lindblad is awake today and did assist with rolling and sitting on side of bed , requiring max assist for trunk and legs, able to support self in sitting.. The patient did have loose BM in sitting. Unable to attempt standing today. Continue with progressive mobility/standing attempts.   Follow Up Recommendations  SNF     Equipment Recommendations  None recommended by PT    Recommendations for Other Services       Precautions / Restrictions Precautions Precautions: Fall Precaution Comments: 1 right drain, abd, may have loose BM, NG suction Restrictions Weight Bearing Restrictions: No    Mobility  Bed Mobility   Bed Mobility: Rolling;Sidelying to Sit;Sit to Sidelying Rolling: Mod assist Sidelying to sit: Max assist     Sit to sidelying: Max assist General bed mobility comments: patient did assist with rolling to left and right, assisted with legs and trunk to sit up and to return to supine. Patient had loose BM while sitting up.  Transfers                 General transfer comment: NT due to [facility]  Ambulation/Gait                 Stairs             Wheelchair Mobility    Modified Rankin (Stroke Patients Only)       Balance Overall balance assessment: Needs assistance Sitting-balance support: Bilateral upper extremity supported;Feet supported Sitting balance-Leahy Scale: Fair Sitting balance - Comments: sits at midline, holds onto rail                                    Cognition Arousal/Alertness:  Awake/alert Behavior During Therapy: Flat affect;WFL for tasks assessed/performed Overall Cognitive Status: Difficult to assess                                 General Comments: indicated that he had a BM      Exercises      General Comments        Pertinent Vitals/Pain Pain Assessment: No/denies pain Faces Pain Scale: Hurts little more Pain Location: holds abdomen when rolling Pain Descriptors / Indicators: Sore;Grimacing;Guarding Pain Intervention(s): Monitored during session    Home Living                      Prior Function            PT Goals (current goals can now be found in the care plan section) Progress towards PT goals: Progressing toward goals    Frequency    Min 2X/week      PT Plan Current plan remains appropriate    Co-evaluation              AM-PAC PT "6 Clicks" Mobility   Outcome Measure  Help needed turning from your back to your side while  in a flat bed without using bedrails?: A Lot Help needed moving from lying on your back to sitting on the side of a flat bed without using bedrails?: A Lot Help needed moving to and from a bed to a chair (including a wheelchair)?: Total Help needed standing up from a chair using your arms (e.g., wheelchair or bedside chair)?: Total Help needed to walk in hospital room?: Total Help needed climbing 3-5 steps with a railing? : Total 6 Click Score: 8    End of Session   Activity Tolerance: Treatment limited secondary to medical complications (Comment) Patient left: in bed;with call bell/phone within reach;with nursing/sitter in room Nurse Communication: Mobility status;Need for lift equipment PT Visit Diagnosis: Difficulty in walking, not elsewhere classified (R26.2);Muscle weakness (generalized) (M62.81)     Time: 1015-1040 PT Time Calculation (min) (ACUTE ONLY): 25 min  Charges:  $Therapeutic Activity: 23-37 mins                     Tresa Endo PT Acute Rehabilitation  Services Pager [phone] Office [phone]    Claretha Cooper 10/26/2019, 10:56 AM

## 2019-10-26 NOTE — Progress Notes (Signed)
Assessment & Plan: POD#14 - Perforated Duodenal Ulcer S/p Exploratory laparotomy, omental patch repair of duodenal ulcer - Dr. [name] - 10/12/2019 Continue NG, NPO; allow ice chips IR drain with purulent fluid - continue to monitor IV Zosyn             TNA             Wound care - open with dressing changes  Continue NPO, NG decompression, IR drain in place. Again discussed with daughter at bedside this AM. Plan repeat UGI series this week depending on quality and quantity of output from drain and NG tube.  Ice chips for comfort.        [name], MD       [facility]       Office: [phone]   Chief Complaint: Perforated ulcer  Subjective: Patient in bed, states feels like needs to have BM.  Daughter at bedside.  Nursing in room.  Objective: Vital signs in last 24 hours: Temp:  [98.1 F (36.7 C)-99.4 F (37.4 C)] 98.1 F (36.7 C) (06/28 0350) Pulse Rate:  [73-88] 79 (06/28 0500) Resp:  [13-23] 13 (06/28 0500) BP: (119-161)/(46-82) 147/67 (06/28 0500) SpO2:  [91 %-100 %] 97 % (06/28 0500) Weight:  [87.9 kg] 87.9 kg (06/28 0500) Last BM Date: 10/25/19  Intake/Output from previous day: 06/27 0701 - 06/28 0700 In: 3525.2 [P.O.:20; I.V.:2248.7; Blood:886.7; IV Piggyback:309.8] Out: 6789 [Urine:2650; Emesis/NG output:575; Drains:35] Intake/Output this shift: No intake/output data recorded.  Physical Exam: HEENT - sclerae clear, mucous membranes moist Neck - soft Abdomen - soft without distension; dressing dry and intact; Perc drain with purulent fluid, tan, moderate; NG thin bilious Neuro - alert & oriented, no focal deficits  Lab Results:  Recent Labs    10/25/19 1845 10/26/19 0320  WBC 8.6 8.7  HGB 8.9* 9.3*  HCT 27.4* 28.3*  PLT 392 419*   BMET Recent Labs    10/25/19 0803 10/26/19 0320  NA 143 141  K 4.7 4.3  CL 109 110  CO2 26 24  GLUCOSE 111* 107*  BUN 35* 31*  CREATININE 1.02  0.94  CALCIUM 7.8* 8.1*   PT/INR No results for input(s): LABPROT, INR in the last 72 hours. Comprehensive Metabolic Panel:    Component Value Date/Time   NA 141 10/26/2019 0320   NA 143 10/25/2019 0803   NA 137 11/02/2016 1509   NA 137 06/22/2015 1421   K 4.3 10/26/2019 0320   K 4.7 10/25/2019 0803   K 4.4 11/02/2016 1509   K 4.8 06/22/2015 1421   CL 110 10/26/2019 0320   CL 109 10/25/2019 0803   CO2 24 10/26/2019 0320   CO2 26 10/25/2019 0803   CO2 27 11/02/2016 1509   CO2 25 06/22/2015 1421   BUN 31 (H) 10/26/2019 0320   BUN 35 (H) 10/25/2019 0803   BUN 16.7 11/02/2016 1509   BUN 17.7 06/22/2015 1421   CREATININE 0.94 10/26/2019 0320   CREATININE 1.02 10/25/2019 0803   CREATININE 0.9 11/02/2016 1509   CREATININE 0.9 06/22/2015 1421   GLUCOSE 107 (H) 10/26/2019 0320   GLUCOSE 111 (H) 10/25/2019 0803   GLUCOSE 153 (H) 11/02/2016 1509   GLUCOSE 126 06/22/2015 1421   CALCIUM 8.1 (L) 10/26/2019 0320   CALCIUM 7.8 (L) 10/25/2019 0803   CALCIUM 9.1 11/02/2016 1509   CALCIUM 9.3 06/22/2015 1421   AST 29 10/26/2019 0320   AST 29 10/25/2019 0803   AST 15  11/02/2016 1509   AST 19 06/22/2015 1421   ALT 37 10/26/2019 0320   ALT 40 10/25/2019 0803   ALT 12 11/02/2016 1509   ALT 18 06/22/2015 1421   ALKPHOS 56 10/26/2019 0320   ALKPHOS 55 10/25/2019 0803   ALKPHOS 58 11/02/2016 1509   ALKPHOS 71 06/22/2015 1421   BILITOT 1.0 10/26/2019 0320   BILITOT 0.9 10/25/2019 0803   BILITOT 0.71 11/02/2016 1509   BILITOT 0.40 06/22/2015 1421   PROT 6.6 10/26/2019 0320   PROT 6.0 (L) 10/25/2019 0803   PROT 7.0 11/02/2016 1509   PROT 6.9 06/22/2015 1421   PROT 7.4 06/22/2015 1421   ALBUMIN 1.8 (L) 10/26/2019 0320   ALBUMIN 1.7 (L) 10/25/2019 0803   ALBUMIN 3.6 11/02/2016 1509   ALBUMIN 3.8 06/22/2015 1421    Studies/Results: No results found.    [name] 10/26/2019  Patient ID: Matthew Schwartz, male   DOB: 1932/11/24, 86 y.o.   MRN: [id number]

## 2019-10-26 NOTE — Progress Notes (Signed)
Daily Progress Note   Patient Name: Matthew Schwartz       Date: 10/26/2019 DOB: [birth year]  Age: 86 y.o. MRN#: [id number] Attending Physician: Alma Friendly, MD Primary Care Physician: [name], MD Admit Date: 10/12/2019  Reason for Consultation/Follow-up: Establishing goals of care  Subjective:   resting in bed, daughter at bedside, states patient is having a better day today.     Length of Stay: 14  Current Medications: Scheduled Meds:  . Chlorhexidine Gluconate Cloth  6 each Topical Daily  . cloNIDine  0.2 mg Transdermal Weekly  . insulin aspart  0-20 Units Subcutaneous Q4H  . insulin detemir  10 Units Subcutaneous BID  . ipratropium-albuterol  3 mL Nebulization TID  . lidocaine  1 patch Transdermal Q24H  . mouth rinse  15 mL Mouth Rinse BID  . metoprolol tartrate  5 mg Intravenous Q4H  . nitroGLYCERIN  1 inch Topical Q6H  . pantoprazole (PROTONIX) IV  40 mg Intravenous Q12H  . sodium chloride flush  10-40 mL Intracatheter Q12H  . sodium chloride flush  5 mL Intracatheter Q8H    Continuous Infusions: . sodium chloride 10 mL/hr at 10/26/19 1045  . sodium chloride Stopped (10/20/19 2021)  . fluconazole (DIFLUCAN) IV Stopped (10/25/19 2016)  . piperacillin-tazobactam (ZOSYN)  IV 3.375 g (10/26/19 1207)  . TPN ADULT (ION) 95 mL/hr at 10/26/19 1045  . TPN ADULT (ION)      PRN Meds: sodium chloride, acetaminophen **OR** acetaminophen, hydrALAZINE, ipratropium-albuterol, lip balm, LORazepam, morphine injection, ondansetron (ZOFRAN) IV, silver nitrate applicators, sodium chloride flush  Physical Exam         Elderly gentleman who is awake alert oriented resting in bed Verbalizes appropriately Has NG tube with the suction Has some dependent edema Regular work of  breathing S1-S2 Abdomen not distended  Vital Signs: BP 131/70   Pulse 88   Temp 99.3 F (37.4 C) (Axillary)   Resp 15   Ht 5\' 11"  (1.803 m) Comment: per daughter  Wt 87.9 kg   SpO2 97%   BMI 27.03 kg/m  SpO2: SpO2: 97 % O2 Device: O2 Device: Room Air O2 Flow Rate: O2 Flow Rate (L/min): 2 L/min  Intake/output summary:   Intake/Output Summary (Last 24 hours) at 10/26/2019 1354 Last data filed at 10/26/2019 1045 Gross per 24 hour  Intake 4166.43 ml  Output 3260 ml  Net 906.43 ml   LBM: Last BM Date: 10/26/19 Baseline Weight: Weight: 96.4 kg Most recent weight: Weight: 87.9 kg       Palliative Assessment/Data:     Palliative performance scale 30% Patient Active Problem List   Diagnosis Date Noted  . Palliative care by specialist   . Goals of care, counseling/discussion   . General weakness   . Acute abdominal pain   . Bilateral atelectasis 10/17/2019  . Acute respiratory insufficiency   . Elevated troponin level   . Acute respiratory failure with hypoxemia ([place])   . PVCs (premature ventricular contractions)   . Obstipation 10/12/2019  . HLD (hyperlipidemia) 10/12/2019  . Diabetes mellitus type 2, uncontrolled, with complications ([place]) 89/38/1017  . Perforated bowel ([place]) 10/12/2019  . Perforated duodenal ulcer ([place]) 10/12/2019  . Anemia 09/07/2016  . Coronary artery disease due to lipid rich plaque 02/16/2015  . Hyperlipidemia 02/16/2015  . Malignant neoplasm of prostate ([place]) 02/18/2014  . HTN (hypertension) 06/14/2013  . Dyslipidemia 06/14/2013  . GERD (gastroesophageal reflux disease) 06/14/2013    Palliative Care Assessment & Plan   Patient Profile:    Assessment: 86 year old gentleman with coronary artery disease status post percutaneous intervention in the year 2000, hypertension, dyslipidemia, diabetes who was admitted with perforated duodenal ulcer and peritonitis. Cardiology has been following for NSVT, intermittent left bundle branch block and  anterolateral T wave inversions as well as elevated troponin, at some point is supposed to undergo cardiac catheterization after acute issues resolved. General surgery also following.  Has NG tube.  To continue TPN.  To have upper GI series first of next week.  Recommendations/Plan:  Full code, full scope care  Family requests for CIR evaluation when appropriate, discussed with TRH MD.   Continue current mode of care.   PMT will follow periodically, throughout the course of this hospitalization.    Goals of Care and Additional Recommendations:  Limitations on Scope of Treatment: Full Scope Treatment  Code Status:    Code Status Orders  (From admission, onward)         Start     Ordered   10/12/19 1208  Full code  Continuous        10/12/19 1212        Code Status History    Date Active Date Inactive Code Status Order ID Comments User Context   02/12/2014 1127 02/13/2014 0336 Full Code [id number]  Arne Cleveland, MD [place] Planning Activity    Advance Directive Documentation     Most Recent Value  Type of Advance Directive Healthcare Power of Attorney, Living will  Pre-existing out of facility DNR order (yellow form or pink MOST form) --  "MOST" Form in Place? --       Prognosis:   Unable to determine Guarded, continue to monitor hospital course and overall disease trajectory. Discharge Planning:  To Be Determined Recommend: Inpatient rehab or SNF rehab with palliative Care plan was discussed with daughter  Thank you for allowing the Palliative Medicine Team to assist in the care of this patient.   Time In: 1400 Time Out: 1425 Total Time 25 Prolonged Time Billed  no       Greater than 50%  of this time was spent counseling and coordinating care related to the above assessment and plan.  Loistine Chance, MD  Please contact Palliative Medicine Team phone at [phone] for questions and concerns.

## 2019-10-26 NOTE — Progress Notes (Signed)
PHARMACY - TOTAL PARENTERAL NUTRITION CONSULT NOTE   Indication: Prolonged ileus  Patient Measurements: Height: 5\' 11"  (180.3 cm) (per daughter) Weight: 87.9 kg (193 lb 12.6 oz) IBW/kg (Calculated) : 75.3   Body mass index is 27.03 kg/m. Usual Weight: 90.7 kg   Assessment:  - Pharmacy is consulted to start TPN in 86 yo male with prolonged ileus. Pt underwent exploratory laparotomy on 6/14 which revealed perforated duodenal ulcer.   Glucose / Insulin: hx of DM - on metformin PTA.  - CBGs < 150 with 60 units/bag insulin in TPN, rSSI q4h (range 103-130; goal 100-150) - 6 units of SS insulin required yesterday - Levemir 10 units BID per CCM started 6/18  Electrolytes: K (target >/= 4.0) is 4.3. Magnesium 2.4 (target >/= 2.0); others wnl including corrected calcium 9.9 Renal: SCr wnl,  BUN elevated; bicarb WNL (UOP increased after receiving Lasix 6/16-6/17) LFTs / TGs: LFTs remain WNL, Tbili improved to WNL after procedure; TG are now WNL after stopping propofol. Previously elevated level likely spurious  Prealbumin / albumin: prealbumin 9.6 (6/28), 9.0 (6/21); albumin 1.8 Intake / Output; MIVF: NS at [facility]; NG output 575 ml charted , UOP 2.6 L GI Imaging:  - 6/21 Upper GI series: small leak from superior margin of duodenal bulb. - 6/23 CT abdomen: 4.3 cm air-fluid collection in the right upper quadrant of the abdomen between the duodenal bulb and gallbladder, not containing enteric contrast. No residual leak from the duodenal bulb identified. Surgeries / Procedures:  - 6/14 Exploratory laparotomy which revealed perforated duodenal ulcer - 6/24 RUQ drain placed > 10 ml > cultured - 6/25 removal R abd JP drain   Central access:  PICC Line 6/15 TPN start date: 6/15  Nutritional Goals:   (per RD recommendation on 6/15): Kcal:  2200-2400 Protein:  115-125 grams Fluid:  >2L/d Goal TPN rate is 95 mL/hr (provides 121 g of protein and 2262 kcals per day)  Current Nutrition:  NPO    Plan:   Continue TPN at 95 mL/hr at 1800, goal rate   Electrolytes in TPN: 80 mEq/L of Na, 80 mEq/L of K (max in TPN per [facility] policy) , 4 mEq/L of Ca, 12 mEq/L of Magnesium, and 10 mmol/L of Phos. Cl:Ac ratio max Ac  Standard MVI and trace elements to TPN  Continue SSI resistant  q4h SSI, and monitor Levemir and adjust as needed   Continue 60 units of regular insulin/day in TPN   Continue MIVF to KVO mL/hr at 1800  Monitor TPN labs on Mon/Thurs  Ulice Dash, PharmD, BCPS 10/26/2019, 7:36 AM

## 2019-10-26 NOTE — Progress Notes (Signed)
Progress Note  Patient Name: Matthew Schwartz Date of Encounter: 10/26/2019  [facility] Cardiologist: Candee Furbish, MD   Subjective   Awake, converses with his daughter's help. We discussed his telemetry (appears to be artifact, no acute changes on ECG). Discussed cath, see below. No chest pain. Has arm pain near the site of his DVT. Abdominal pain fairly well controlled, asking for more pain medication at bedtime to help him sleep.  Inpatient Medications    Scheduled Meds: . Chlorhexidine Gluconate Cloth  6 each Topical Daily  . cloNIDine  0.2 mg Transdermal Weekly  . insulin aspart  0-20 Units Subcutaneous Q4H  . insulin detemir  10 Units Subcutaneous BID  . ipratropium-albuterol  3 mL Nebulization TID  . lidocaine  1 patch Transdermal Q24H  . mouth rinse  15 mL Mouth Rinse BID  . metoprolol tartrate  5 mg Intravenous Q4H  . nitroGLYCERIN  1 inch Topical Q6H  . pantoprazole (PROTONIX) IV  40 mg Intravenous Q12H  . sodium chloride flush  10-40 mL Intracatheter Q12H   Continuous Infusions: . sodium chloride 10 mL/hr at 10/25/19 2034  . sodium chloride Stopped (10/20/19 2021)  . fluconazole (DIFLUCAN) IV Stopped (10/25/19 2016)  . piperacillin-tazobactam (ZOSYN)  IV Stopped (10/26/19 0901)  . TPN ADULT (ION) 95 mL/hr at 10/25/19 1716  . TPN ADULT (ION)     PRN Meds: sodium chloride, acetaminophen **OR** acetaminophen, hydrALAZINE, ipratropium-albuterol, lip balm, LORazepam, morphine injection, ondansetron (ZOFRAN) IV, silver nitrate applicators, sodium chloride flush   Vital Signs    Vitals:   10/26/19 0600 10/26/19 0700 10/26/19 0800 10/26/19 0915  BP: (!) 150/76 (!) 148/69 (!) 156/72   Pulse: 83 75 76   Resp: 16 15 (!) 21   Temp:   97.6 F (36.4 C)   TempSrc:   Oral   SpO2: 96% 99% 96% 98%  Weight:      Height:        Intake/Output Summary (Last 24 hours) at 10/26/2019 1003 Last data filed at 10/26/2019 0523 Gross per 24 hour  Intake 3525.23 ml  Output 3260 ml    Net 265.23 ml   Last 3 Weights 10/26/2019 10/23/2019 10/22/2019  Weight (lbs) 193 lb 12.6 oz 203 lb 0.7 oz 199 lb 4.7 oz  Weight (kg) 87.9 kg 92.1 kg 90.4 kg      Telemetry    Normal sinus rhythm, occasional PVCs, no NSVT seen - Personally Reviewed  ECG    Normal sinus rhythm, with profound ST-T wave changes - Personally Reviewed  Physical Exam   GEN: Ill appearing gentleman, NG tube in place Neck: No JVD visible Cardiac: RRR, no murmurs, rubs, or gallops.  Respiratory: Clear to auscultation bilaterally anteriorly GI: Distended, tender MS: Trivial LE edema; No deformity. Neuro:  Nonfocal  Psych: Normal affect   Labs    High Sensitivity Troponin:   Recent Labs  Lab 10/13/19 1014 10/15/19 2124 10/15/19 2314 10/16/19 0237 10/16/19 0500  TROPONINIHS 55* 659* 1,022* 871* 776*      Chemistry Recent Labs  Lab 10/24/19 0414 10/25/19 0803 10/26/19 0320  NA 137 143 141  K 4.1 4.7 4.3  CL 106 109 110  CO2 22 26 24   GLUCOSE 117* 111* 107*  BUN 34* 35* 31*  CREATININE 0.87 1.02 0.94  CALCIUM 7.6* 7.8* 8.1*  PROT 6.1* 6.0* 6.6  ALBUMIN 1.8* 1.7* 1.8*  AST 33 29 29  ALT 46* 40 37  ALKPHOS 62 55 56  BILITOT 0.8 0.9 1.0  GFRNONAA >60 >60 >60  GFRAA >60 >60 >60  ANIONGAP 9 8 7      Hematology Recent Labs  Lab 10/25/19 0530 10/25/19 1845 10/26/19 0320  WBC 8.9 8.6 8.7  RBC 2.29* 3.30* 3.43*  HGB 6.2* 8.9* 9.3*  HCT 20.1* 27.4* 28.3*  MCV 87.8 83.0 82.5  MCH 27.1 27.0 27.1  MCHC 30.8 32.5 32.9  RDW 17.9* 16.3* 16.4*  PLT 385 392 419*    BNPNo results for input(s): BNP, PROBNP in the last 168 hours.   DDimer No results for input(s): DDIMER in the last 168 hours.   Radiology    No results found.  Cardiac Studies   Echo 10/16/19 1. Image quality remains technically difficult, making wall motion  evaluation challenging. There is global left ventricular hypokinesis, but  there appears to be disproportionately severe inferolateral wall  hypokinesis.  Left ventricular ejection  fraction, by estimation, is 40 to 45%. The left ventricle has mildly  decreased function. The left ventricle has no regional wall motion  abnormalities. Left ventricular diastolic parameters are consistent with  Grade I diastolic dysfunction (impaired  relaxation).  2. Right ventricular systolic function is normal. The right ventricular  size is normal.  3. The mitral valve is normal in structure. No evidence of mitral valve  regurgitation. No evidence of mitral stenosis.  4. The aortic valve is normal in structure. Aortic valve regurgitation is  not visualized. No aortic stenosis is present.  5. The inferior vena cava is normal in size with greater than 50%  respiratory variability, suggesting right atrial pressure of 3 mmHg.   Patient Profile     86 y.o. male with PMH CAD s/p balloon angioplasty (no stent) to RCA in 2000, hypertension, hyperlipidemia, type II diabetes who was admitted for perforated duodenal ulcer and septic shock 2/2 peritonitis. Cardiology consulted for NSVT and ECG changes as well as elevated troponin.  Assessment & Plan    Nonsustained VT: -no additional NSVT seen. Occasional PVCs -continue beta blocker, convert to oral when able  Acute systolic and diastolic heart failure: -no prior to admission echo that I can find; mention of EF 55% on prior nuclear study per Dr. Marlou Porch' note -very difficult to assess on echoes this admission, even with contrast. See echo, above. EF estimated 40-45%. -limited options for medical management while strict NPO. Will add goal directed medical therapy when able -no acute plans for cath, see below.  Elevated troponin: demand ischemia vs. NSTEMI, in the setting of known CAD -I agree with Dr. Jackalyn Lombard note from yesterday. He is asymptomatic, and with his anemia, age, acute medical illness, the risk of cath outweighs the benefit at this time.  -I discussed this with the patient and his daughter at  bedside, and they agree. Would do urgent cath only if he has severe symptoms that fail medical management, high risk arrhythmia, or STEMI given his current comorbid medical issues -would restart statin when able  Hypertension: slightly elevated today -limited options as he is strict NPO, continue current therapy -restart oral antihypertensives when able  Deep vein thrombosis, acute, in left axillary vein surrounding PICC line (DVT) and left cephalic vein (superficial): -defer to primary team, but due to anemia requiring transfusion, anticoagulation is on hold at this time.  For questions or updates, please contact [place] Please consult [email or web address] for contact info under        Signed, Buford Dresser, MD  10/26/2019, 10:03 AM

## 2019-10-26 NOTE — Progress Notes (Signed)
Chief Complaint: S/p perc chole drain placement by Dr. Earleen Newport    Supervising Physician: Markus Daft   Patient Status: [facility] - In-pt  Subjective: S/p perc drain to RUA abscess that developed after surgical repair of perf duodenal ulcer. Feeling okay today. Daughter at bedside.   Objective: Physical Exam: BP (!) 156/72   Pulse 76   Temp 97.6 F (36.4 C) (Oral)   Resp (!) 21   Ht 5\' 11"  (1.803 m) Comment: per daughter  Wt 87.9 kg   SpO2 98%   BMI 27.03 kg/m  RUQ drain intact, site clean. Blood tinged purulent output. No pain at the drain site.     Current Facility-Administered Medications:  .  0.9 %  sodium chloride infusion, , Intravenous, Continuous, Sood, Vineet, MD, Last Rate: 10 mL/hr at 10/25/19 2034, New Bag at 10/25/19 2034 .  0.9 %  sodium chloride infusion, , Intravenous, PRN, Chesley Mires, MD, Stopped at 10/20/19 2021 .  acetaminophen (TYLENOL) tablet 650 mg, 650 mg, Oral, Q6H PRN **OR** acetaminophen (TYLENOL) suppository 650 mg, 650 mg, Rectal, Q6H PRN, Allie Bossier, MD, 650 mg at 10/25/19 2308 .  Chlorhexidine Gluconate Cloth 2 % PADS 6 each, 6 each, Topical, Daily, Chesley Mires, MD, 6 each at 10/25/19 1117 .  cloNIDine (CATAPRES - Dosed in mg/24 hr) patch 0.2 mg, 0.2 mg, Transdermal, Weekly, Christinia Gully B, MD, 0.2 mg at 10/24/19 1030 .  fluconazole (DIFLUCAN) IVPB 400 mg, 400 mg, Intravenous, Q24H, Angela Adam, RPH, Stopped at 10/25/19 2016 .  hydrALAZINE (APRESOLINE) injection 20 mg, 20 mg, Intravenous, Q4H PRN, Christinia Gully B, MD .  insulin aspart (novoLOG) injection 0-20 Units, 0-20 Units, Subcutaneous, Q4H, Chesley Mires, MD, 3 Units at 10/26/19 0059 .  insulin detemir (LEVEMIR) injection 10 Units, 10 Units, Subcutaneous, BID, Chesley Mires, MD, 10 Units at 10/25/19 2139 .  ipratropium-albuterol (DUONEB) 0.5-2.5 (3) MG/3ML nebulizer solution 3 mL, 3 mL, Nebulization, Q4H PRN, Alma Friendly, MD .  ipratropium-albuterol (DUONEB) 0.5-2.5 (3)  MG/3ML nebulizer solution 3 mL, 3 mL, Nebulization, TID, Alma Friendly, MD, 3 mL at 10/26/19 0915 .  lidocaine (LIDODERM) 5 % 1 patch, 1 patch, Transdermal, Q24H, Mannam, Praveen, MD, 1 patch at 10/25/19 1107 .  lip balm (CARMEX) ointment, , Topical, PRN, Tanda Rockers, MD, Given at 10/18/19 2154 .  LORazepam (ATIVAN) injection 0.5 mg, 0.5 mg, Intravenous, Q12H PRN, Alma Friendly, MD, 0.5 mg at 10/25/19 1739 .  MEDLINE mouth rinse, 15 mL, Mouth Rinse, BID, Halford Chessman, Vineet, MD, 15 mL at 10/26/19 0912 .  metoprolol tartrate (LOPRESSOR) injection 5 mg, 5 mg, Intravenous, Q4H, Tanda Rockers, MD, 5 mg at 10/26/19 0911 .  morphine 2 MG/ML injection 2 mg, 2 mg, Intravenous, Q3H PRN, Alma Friendly, MD, 2 mg at 10/25/19 2200 .  nitroGLYCERIN (NITROGLYN) 2 % ointment 1 inch, 1 inch, Topical, Q6H, Ogan, [place]wo U, MD, 1 inch at 10/26/19 0551 .  ondansetron (ZOFRAN) injection 4 mg, 4 mg, Intravenous, Q6H PRN, Allie Bossier, MD, 4 mg at 10/24/19 1323 .  pantoprazole (PROTONIX) injection 40 mg, 40 mg, Intravenous, Q12H, Baron-Johnson, Alison, PA-C, 40 mg at 10/26/19 0911 .  piperacillin-tazobactam (ZOSYN) IVPB 3.375 g, 3.375 g, Intravenous, Q8H, Adrian Saran, [place], Stopped at 10/26/19 0901 .  silver nitrate applicators applicator 1 application, 1 application, Topical, Daily PRN, Gerkin, Todd, MD .  sodium chloride flush (NS) 0.9 % injection 10-40 mL, 10-40 mL, Intracatheter, Q12H, Sood, Vineet, MD, 10 mL  at 10/26/19 0912 .  sodium chloride flush (NS) 0.9 % injection 10-40 mL, 10-40 mL, Intracatheter, PRN, Chesley Mires, MD, 10 mL at 10/23/19 1720 .  TPN ADULT (ION), , Intravenous, Continuous TPN, Minda Ditto, RPH, Last Rate: 95 mL/hr at 10/25/19 1716, New Bag at 10/25/19 1716 .  TPN ADULT (ION), , Intravenous, Continuous TPN, Alma Friendly, MD  Labs: CBC Recent Labs    10/25/19 1845 10/26/19 0320  WBC 8.6 8.7  HGB 8.9* 9.3*  HCT 27.4* 28.3*  PLT 392 419*    BMET Recent Labs    10/25/19 0803 10/26/19 0320  NA 143 141  K 4.7 4.3  CL 109 110  CO2 26 24  GLUCOSE 111* 107*  BUN 35* 31*  CREATININE 1.02 0.94  CALCIUM 7.8* 8.1*   LFT Recent Labs    10/26/19 0320  PROT 6.6  ALBUMIN 1.8*  AST 29  ALT 37  ALKPHOS 56  BILITOT 1.0   PT/INR No results for input(s): LABPROT, INR in the last 72 hours.   Studies/Results: No results found.  Assessment/Plan: S/p perc drain RUQ abscess Post op perf duod ulcer repair. Plan per primary team. Rec CT when output decreases or changes to more serous appearance.     LOS: 14 days   I spent a total of 15 minutes in face to face in clinical consultation, greater than 50% of which was counseling/coordinating care for RUQ abscess drain  Aimee H Han PA student [name] [place],PA-C 10/26/2019 9:24 AM

## 2019-10-27 LAB — GLUCOSE, CAPILLARY
Glucose-Capillary: 120 mg/dL — ABNORMAL HIGH (ref 70–99)
Glucose-Capillary: 136 mg/dL — ABNORMAL HIGH (ref 70–99)
Glucose-Capillary: 137 mg/dL — ABNORMAL HIGH (ref 70–99)
Glucose-Capillary: 117 mg/dL — ABNORMAL HIGH (ref 70–99)
Glucose-Capillary: 116 mg/dL — ABNORMAL HIGH (ref 70–99)
Glucose-Capillary: 147 mg/dL — ABNORMAL HIGH (ref 70–99)

## 2019-10-27 LAB — COMPREHENSIVE METABOLIC PANEL
ALT: 35 U/L (ref 0–44)
AST: 27 U/L (ref 15–41)
Albumin: 1.7 g/dL — ABNORMAL LOW (ref 3.5–5.0)
Alkaline Phosphatase: 62 U/L (ref 38–126)
Anion gap: 10 (ref 5–15)
BUN: 30 mg/dL — ABNORMAL HIGH (ref 8–23)
CO2: 21 mmol/L — ABNORMAL LOW (ref 22–32)
Calcium: 7.7 mg/dL — ABNORMAL LOW (ref 8.9–10.3)
Chloride: 108 mmol/L (ref 98–111)
Creatinine, Ser: 0.91 mg/dL (ref 0.61–1.24)
GFR calc Af Amer: >60 mL/min (ref >60)
GFR calc non Af Amer: >60 mL/min (ref >60)
Glucose, Bld: 128 mg/dL — ABNORMAL HIGH (ref 70–99)
Potassium: 4.3 mmol/L (ref 3.5–5.1)
Sodium: 139 mmol/L (ref 135–145)
Total Bilirubin: 0.7 mg/dL (ref 0.3–1.2)
Total Protein: 6.3 g/dL — ABNORMAL LOW (ref 6.5–8.1)

## 2019-10-27 LAB — CBC WITH DIFFERENTIAL/PLATELET
Abs Immature Granulocytes: 0.25 10*3/uL — ABNORMAL HIGH (ref 0.00–0.07)
Basophils Absolute: 0 10*3/uL (ref 0.0–0.1)
Basophils Relative: 0 %
Eosinophils Absolute: 0.3 10*3/uL (ref 0.0–0.5)
Eosinophils Relative: 3 %
HCT: 28.6 % — ABNORMAL LOW (ref 39.0–52.0)
Hemoglobin: 9 g/dL — ABNORMAL LOW (ref 13.0–17.0)
Immature Granulocytes: 3 %
Lymphocytes Relative: 14 %
Lymphs Abs: 1.3 10*3/uL (ref 0.7–4.0)
MCH: 26.6 pg (ref 26.0–34.0)
MCHC: 31.5 g/dL (ref 30.0–36.0)
MCV: 84.6 fL (ref 80.0–100.0)
Monocytes Absolute: 1 10*3/uL (ref 0.1–1.0)
Monocytes Relative: 11 %
Neutro Abs: 6.2 10*3/uL (ref 1.7–7.7)
Neutrophils Relative %: 69 %
Platelets: 416 10*3/uL — ABNORMAL HIGH (ref 150–400)
RBC: 3.38 MIL/uL — ABNORMAL LOW (ref 4.22–5.81)
RDW: 17.1 % — ABNORMAL HIGH (ref 11.5–15.5)
WBC: 9 10*3/uL (ref 4.0–10.5)
nRBC: 0 % (ref 0.0–0.2)

## 2019-10-27 LAB — HEPARIN LEVEL (UNFRACTIONATED): Heparin Unfractionated: 0.27 IU/mL — ABNORMAL LOW (ref 0.30–0.70)

## 2019-10-27 LAB — MAGNESIUM: Magnesium: 2.2 mg/dL (ref 1.7–2.4)

## 2019-10-27 LAB — AEROBIC/ANAEROBIC CULTURE W GRAM STAIN (SURGICAL/DEEP WOUND)

## 2019-10-27 LAB — PHOSPHORUS: Phosphorus: 3.1 mg/dL (ref 2.5–4.6)

## 2019-10-27 MED ORDER — HEPARIN (PORCINE) 25000 UT/250ML-% IV SOLN
1150.0000 [IU]/h | INTRAVENOUS | Status: DC
  Administered 2019-10-27: 1150 [IU]/h via INTRAVENOUS
  Filled 2019-10-27: qty 250

## 2019-10-27 MED ORDER — IPRATROPIUM-ALBUTEROL 0.5-2.5 (3) MG/3ML IN SOLN
3.0000 mL | Freq: Two times a day (BID) | RESPIRATORY_TRACT | Status: DC
  Administered 2019-10-27 – 2019-10-28 (×2): 3 mL via RESPIRATORY_TRACT
  Filled 2019-10-27 (×2): qty 3

## 2019-10-27 MED ORDER — HEPARIN (PORCINE) 25000 UT/250ML-% IV SOLN
1200.0000 [IU]/h | INTRAVENOUS | Status: DC
  Administered 2019-10-27 – 2019-10-28 (×3): 1200 [IU]/h via INTRAVENOUS

## 2019-10-27 MED ORDER — TRAVASOL 10 % IV SOLN
INTRAVENOUS | Status: AC
  Filled 2019-10-27: qty 1208.4

## 2019-10-27 NOTE — Progress Notes (Signed)
PHARMACY - TOTAL PARENTERAL NUTRITION CONSULT NOTE   Indication: Prolonged ileus  Patient Measurements: Height: 5\' 11"  (180.3 cm) (per daughter) Weight: 87.9 kg (193 lb 12.6 oz) IBW/kg (Calculated) : 75.3   Body mass index is 27.03 kg/m. Usual Weight: 90.7 kg   Assessment:  - Pharmacy is consulted to start TPN in 86 yo male with prolonged ileus. Pt underwent exploratory laparotomy on 6/14 which revealed perforated duodenal ulcer.   Glucose / Insulin: hx of DM - on metformin PTA.  - CBGs < 150 with 60 units/bag insulin in TPN, rSSI q4h (range 118-134 ; goal 100-150) - 9 units of SS insulin required yesterday - Levemir 10 units BID per CCM started 6/18  Electrolytes: K (target >/= 4.0) is 4.3. Magnesium 2.2 (target >/= 2.0); others wnl including corrected calcium 9.5 Renal: SCr wnl,  BUN elevated; bicarb slightly low (UOP increased after receiving Lasix 6/16-6/17) LFTs / TGs: LFTs remain WNL, Tbili improved to WNL after procedure; TG are now WNL after stopping propofol. Previously elevated level likely spurious  Prealbumin / albumin: prealbumin 9.6 (6/28), 9.0 (6/21); albumin 1.7 Intake / Output; MIVF: NS at [facility]; NG output 700 ml charted , UOP 2.6 L GI Imaging:  - 6/21 Upper GI series: small leak from superior margin of duodenal bulb. - 6/23 CT abdomen: 4.3 cm air-fluid collection in the right upper quadrant of the abdomen between the duodenal bulb and gallbladder, not containing enteric contrast. No residual leak from the duodenal bulb identified. Surgeries / Procedures:  - 6/14 Exploratory laparotomy which revealed perforated duodenal ulcer - 6/24 RUQ drain placed > 10 ml > cultured - 6/25 removal R abd JP drain   Central access:  PICC Line 6/15 TPN start date: 6/15  Nutritional Goals:   (per RD recommendation on 6/15): Kcal:  2200-2400 Protein:  115-125 grams Fluid:  >2L/d Goal TPN rate is 95 mL/hr (provides 121 g of protein and 2262 kcals per day)  Current Nutrition:   NPO   Plan:   Continue TPN at 95 mL/hr at 1800, goal rate   Electrolytes in TPN: 80 mEq/L of Na, 80 mEq/L of K (max in TPN per [facility] policy) , 4 mEq/L of Ca, 12 mEq/L of Magnesium, and 10 mmol/L of Phos. Cl:Ac ratio max Ac  Standard MVI and trace elements to TPN  Decrease to resistant q6h SSI, and monitor Levemir and adjust as needed   Continue 60 units of regular insulin/day in TPN   Continue MIVF to KVO mL/hr at 1800  BMET, Mg, and phos in AM  Monitor TPN labs on Mon/Thurs  Ulice Dash, PharmD, BCPS 10/27/2019, 7:40 AM

## 2019-10-27 NOTE — Progress Notes (Addendum)
Chief Complaint: S/p perc chole drain placement by Dr. Earleen Newport    Supervising Physician: Daryll Brod   Patient Status: [facility] - In-pt  Subjective: S/p perc drain to RUA abscess that developed after surgical repair of perf duodenal ulcer. Feeling little bit better today. Daughter at bedside.   Objective: Physical Exam: BP 130/65   Pulse 81   Temp (!) 97.4 F (36.3 C) (Oral)   Resp 20   Ht 5\' 11"  (1.803 m) Comment: per daughter  Wt 87.9 kg   SpO2 93%   BMI 27.03 kg/m  RUQ drain intact, site clean. No pain at the drain site. Minimal amount of  purulent fluid from the drain. No blood tinge.  Drain flushed without difficulty    Current Facility-Administered Medications:  .  0.9 %  sodium chloride infusion, , Intravenous, Continuous, Sood, Vineet, MD, Last Rate: 10 mL/hr at 10/27/19 0913, Rate Verify at 10/27/19 0913 .  0.9 %  sodium chloride infusion, , Intravenous, PRN, Chesley Mires, MD, Stopped at 10/20/19 2021 .  acetaminophen (TYLENOL) tablet 650 mg, 650 mg, Oral, Q6H PRN **OR** acetaminophen (TYLENOL) suppository 650 mg, 650 mg, Rectal, Q6H PRN, Allie Bossier, MD, 650 mg at 10/25/19 2308 .  Chlorhexidine Gluconate Cloth 2 % PADS 6 each, 6 each, Topical, Daily, Chesley Mires, MD, 6 each at 10/26/19 1100 .  cloNIDine (CATAPRES - Dosed in mg/24 hr) patch 0.2 mg, 0.2 mg, Transdermal, Weekly, Christinia Gully B, MD, 0.2 mg at 10/24/19 1030 .  fluconazole (DIFLUCAN) IVPB 400 mg, 400 mg, Intravenous, Q24H, Angela Adam, RPH, Stopped at 10/26/19 2001 .  heparin ADULT infusion 100 units/mL (25000 units/257mL sodium chloride 0.45%), 1,150 Units/hr, Intravenous, Continuous, Alma Friendly, MD, Last Rate: 11.5 mL/hr at 10/27/19 0913, 1,150 Units/hr at 10/27/19 0913 .  hydrALAZINE (APRESOLINE) injection 10 mg, 10 mg, Intravenous, Q4H PRN, Alma Friendly, MD .  insulin aspart (novoLOG) injection 0-20 Units, 0-20 Units, Subcutaneous, Q4H, Chesley Mires, MD, 3 Units at  10/27/19 0912 .  insulin detemir (LEVEMIR) injection 10 Units, 10 Units, Subcutaneous, BID, Chesley Mires, MD, 10 Units at 10/27/19 0913 .  ipratropium-albuterol (DUONEB) 0.5-2.5 (3) MG/3ML nebulizer solution 3 mL, 3 mL, Nebulization, Q4H PRN, Alma Friendly, MD .  ipratropium-albuterol (DUONEB) 0.5-2.5 (3) MG/3ML nebulizer solution 3 mL, 3 mL, Nebulization, BID, Horris Latino, Adline Peals, MD .  lidocaine (LIDODERM) 5 % 1 patch, 1 patch, Transdermal, Q24H, Mannam, Praveen, MD, 1 patch at 10/26/19 1206 .  lip balm (CARMEX) ointment, , Topical, PRN, Tanda Rockers, MD, Given at 10/18/19 2154 .  LORazepam (ATIVAN) injection 0.5 mg, 0.5 mg, Intravenous, Q12H PRN, Alma Friendly, MD, 0.5 mg at 10/27/19 0928 .  MEDLINE mouth rinse, 15 mL, Mouth Rinse, BID, Chesley Mires, MD, 15 mL at 10/26/19 2122 .  metoprolol tartrate (LOPRESSOR) injection 5 mg, 5 mg, Intravenous, Q4H, Tanda Rockers, MD, 5 mg at 10/27/19 0927 .  morphine 2 MG/ML injection 2 mg, 2 mg, Intravenous, Q3H PRN, Alma Friendly, MD, 2 mg at 10/27/19 0928 .  nitroGLYCERIN (NITROGLYN) 2 % ointment 1 inch, 1 inch, Topical, Q6H, Ogan, Okoronkwo U, MD, 1 inch at 10/27/19 0509 .  ondansetron (ZOFRAN) injection 4 mg, 4 mg, Intravenous, Q6H PRN, Allie Bossier, MD, 4 mg at 10/27/19 3557 .  pantoprazole (PROTONIX) injection 40 mg, 40 mg, Intravenous, Q12H, Baron-Johnson, Alison, PA-C, 40 mg at 10/27/19 0912 .  piperacillin-tazobactam (ZOSYN) IVPB 3.375 g, 3.375 g, Intravenous, Q8H, Adrian Saran, RPH,  Stopped at 10/27/19 0913 .  silver nitrate applicators applicator 1 application, 1 application, Topical, Daily PRN, Gerkin, Todd, MD .  sodium chloride flush (NS) 0.9 % injection 10-40 mL, 10-40 mL, Intracatheter, Q12H, Chesley Mires, MD, 10 mL at 10/26/19 2122 .  sodium chloride flush (NS) 0.9 % injection 10-40 mL, 10-40 mL, Intracatheter, PRN, Chesley Mires, MD, 10 mL at 10/23/19 1720 .  sodium chloride flush (NS) 0.9 % injection 5 mL, 5  mL, Intracatheter, Q8H, Wagner, Jaime, DO, 5 mL at 10/27/19 0509 .  TPN ADULT (ION), , Intravenous, Continuous TPN, Alma Friendly, MD, Last Rate: 95 mL/hr at 10/27/19 0913, Rate Verify at 10/27/19 0913 .  TPN ADULT (ION), , Intravenous, Continuous TPN, Alma Friendly, MD  Labs: CBC Recent Labs    10/26/19 0320 10/27/19 0508  WBC 8.7 9.0  HGB 9.3* 9.0*  HCT 28.3* 28.6*  PLT 419* 416*   BMET Recent Labs    10/26/19 0320 10/27/19 0508  NA 141 139  K 4.3 4.3  CL 110 108  CO2 24 21*  GLUCOSE 107* 128*  BUN 31* 30*  CREATININE 0.94 0.91  CALCIUM 8.1* 7.7*   LFT Recent Labs    10/27/19 0508  PROT 6.3*  ALBUMIN 1.7*  AST 27  ALT 35  ALKPHOS 62  BILITOT 0.7   PT/INR No results for input(s): LABPROT, INR in the last 72 hours.   Studies/Results: DG Chest Port 1 View  Result Date: 10/26/2019 CLINICAL DATA:  Acute respiratory failure with hypoxia, history diabetes mellitus, prostate cancer, hypertension EXAM: PORTABLE CHEST 1 VIEW COMPARISON:  Portable exam 1121 hours compared to 10/20/2019 FINDINGS: Nasogastric tube extends into stomach. LEFT arm PICC line tip projecting over high RIGHT atrium. Upper normal heart size. Mediastinal contours and pulmonary vascularity normal. Low lung volumes with bibasilar atelectasis. No acute infiltrate, pleural effusion or pneumothorax. IMPRESSION: Decreased lung volumes with bibasilar atelectasis. Electronically Signed   By: Lavonia Dana M.D.   On: 10/26/2019 11:38    Assessment/Plan: S/p perc drain RUQ abscess 6/24 Post op perf duod ulcer repair. General surgery is planning to do UGI water soluble tomorrow and advance his diet if there is no leak. Temp 99.1, WBC nl, creat nl; drain cx- mod candida(on diflucan); drain OP minimal; rec f/u CT on 7/1 if OP remains low      LOS: 15 days   I spent a total of 15 minutes in face to face in clinical consultation, greater than 50% of which was counseling/coordinating care for RUQ  abscess drain  Aimee H Han PA student [name] Latavious Bitter,PA-C 10/27/2019 11:27 AM

## 2019-10-27 NOTE — Progress Notes (Signed)
Pharmacy Antibiotic Note  Matthew Schwartz is a 86 y.o. male admitted on 10/12/2019 with fluconazole Pharmacy has been consulted for intra-abdominal infection dosing.  Plan: Continue fluconazole 400mg  q24h Follow renal function  Height: 5\' 11"  (180.3 cm) (per daughter) Weight: 87.9 kg (193 lb 12.6 oz) IBW/kg (Calculated) : 75.3  Temp (24hrs), Avg:99 F (37.2 C), Min:98.2 F (36.8 C), Max:99.8 F (37.7 C)  Recent Labs  Lab 10/23/19 0500 10/23/19 0500 10/24/19 0414 10/25/19 0530 10/25/19 0803 10/25/19 1845 10/26/19 0320 10/27/19 0508  WBC 11.9*   < > 11.4* 8.9  --  8.6 8.7 9.0  CREATININE 0.86  --  0.87  --  1.02  --  0.94 0.91   < > = values in this interval not displayed.    Estimated Creatinine Clearance: 64.4 mL/min (by C-G formula based on SCr of 0.91 mg/dL).    Allergies  Allergen Reactions  . Losartan Potassium Other (See Comments)    Unknown reaction that happened 15 years ago   Antimicrobials this admission:  6/15 Zosyn >>  6/26 fluconazole >>  Microbiology results:  6/14 UCx:  <10k insignificant growth  6/14 MRSA PCR: negative 6/24 AbscessCx: mod Candida albicans 6/25 BCx: ngtd   Thank you for allowing pharmacy to be a part of this patient's care.  Ulice Dash, PharmD, BCPS 10/27/2019, 8:09 AM

## 2019-10-27 NOTE — TOC Progression Note (Signed)
Transition of Care [facility]) - Progression Note    Patient Details  Name: Matthew Schwartz MRN: [id number] Date of Birth: [birth year]  Transition of [facility] CM/SW Contact  Payson Crumby, Juliann Pulse, RN Phone Number: 10/27/2019, 8:28 AM  Clinical Narrative:  Spoke to dtr [name] about d/c plans-CIR if appropriate-Good family support. Will await to be more medically stalbe prior to CIR cons. CM following updated.    Expected Discharge Plan: IP Rehab Facility Barriers to Discharge: Continued Medical Work up  Expected Discharge Plan and Services Expected Discharge Plan: [place]   Discharge Planning Services: CM Consult   Living arrangements for the past 2 months: Single Family Home                                       Social Determinants of Health (SDOH) Interventions    Readmission Risk Interventions No flowsheet data found.

## 2019-10-27 NOTE — Progress Notes (Signed)
PROGRESS NOTE  Matthew Schwartz ERX:[id number] DOB: 10/21/1932 DOA: 10/12/2019 PCP: [name], MD  HPI/Recap of past 24 hours: HPI from PCCM 86 y/o male, former smoker, presented with nausea/vomiting, abdominal pain and constipation x 4 to 5 days.  Seen twice prior to current admit for the same with imaging studies unremarkable for cause.  On return 6/14, seen by GI and found to have concern for acute abdomen and taken for exploratory laparotomy.  Found to have perforated duodenal ulcer s/p omental patch repair.  Remained on vent post op.  PCCM consulted for critical care management.  Patient was subsequently extubated on 6/17 with very slow improvement.  [facility] hospitalist assumed care on 10/22/2019.    Today, patient reports feeling better, looks better actually.  Denies any new complaints.  Pain well controlled.      Assessment/Plan: Active Problems:   HTN (hypertension)   Malignant neoplasm of prostate (HCC)   Hyperlipidemia   Obstipation   HLD (hyperlipidemia)   Diabetes mellitus type 2, uncontrolled, with complications (HCC)   Perforated bowel (HCC)   Perforated duodenal ulcer ([place])   Acute respiratory insufficiency   Elevated troponin level   Acute respiratory failure with hypoxemia (HCC)   PVCs (premature ventricular contractions)   Bilateral atelectasis   Acute abdominal pain   Palliative care by specialist   Goals of care, counseling/discussion   General weakness   Septic shock/peritonitis in the setting of perforated duodenal ulcer status post omental patch repair on 6/14 Shock resolved, off pressors Currently afebrile, with resolved leukocytosis Repeat CT abdomen/pelvis done on 10/21/2019 showed some right upper quadrant fluid collection status post drain placement on 10/22/2019 by IR, removed about 10 cc of grayish purulent fluid Follow-up culture, gram stain growing moderate candida albicans Continue IV Zosyn, started IV fluconazole Continue n.p.o. status, TPN, NG  tube per general surgery Monitor closely  Acute respiratory failure with hypoxia in the setting of septic shock Extubated on 6/17, still unable to vocalize, overall weak Currently saturating well on room air Repeat CXR showing atelectasis Will need SLP to evaluate for speech once NG tube is out Continue pulmonary hygiene, incentive spirometry Supplemental oxygen as needed PCCM available prn  Acute systolic HF NSTEMI/NSVT History of CAD, hypertension, hyperlipidemia Currently chest pain-free Cardiology consulted, plan for further management once very stable/able to lie flat without dyspnea/hypoxia, tolerate orally Restart heparin drip mainly for DVT Continue clonidine patch, IV Lopressor, NGT placed Hold home Norvasc, Toprol, Imdur, ASA, Lipitor  Left upper extremity DVT Cephalic/axillary vein involvement Restart heparin drip on 10/27/2019  Diabetes mellitus type 2 A1c done on 10/12/2019 was 6.6 SSI, detemir, Accu-Cheks, hypoglycemic protocol  Anemia of critical illness/post op Hgb dropped to 6.2, s/p transfusion of 2U of PRBC on 10/25/19  Daily CBC  Goals of care discussion Patient significantly deconditioned, with poor prognosis, unsure if patient will ever return back to his baseline, prolonged recovery Palliative consulted to establish goals of care, appreciate recs- full scope of treatment       Malnutrition Type:  Nutrition Problem: Inadequate oral intake Etiology: inability to eat   Malnutrition Characteristics:  Signs/Symptoms: NPO status   Nutrition Interventions:  Interventions: TPN    Estimated body mass index is 27.03 kg/m as calculated from the following:   Height as of this encounter: 5\' 11"  (1.803 m).   Weight as of this encounter: 87.9 kg.     Code Status: Full  Family Communication: Discussed with daughter on 10/27/19  Disposition Plan: Status is: Inpatient  Remains inpatient appropriate because:Inpatient level of care appropriate due  to severity of illness   Dispo: The patient is from: Home              Anticipated d/c is to: SNF Vs CIR              Anticipated d/c date is: > 3 days              Patient currently is not medically stable to d/c.   Consultants:  General surgery  PCCM  Cardiology  IR  Palliative   Procedures:  Omental patch repair on 6/14  Antimicrobials:  Zosyn  Fluconazole  DVT prophylaxis: Heparin drip   Objective: Vitals:   10/27/19 1600 10/27/19 1700 10/27/19 1800 10/27/19 1900  BP: (!) 167/72 135/64 134/70 (!) 147/65  Pulse: 84 78 73 76  Resp: 18 19 (!) 21 20  Temp: 98.7 F (37.1 C)     TempSrc: Oral     SpO2: 92% 93% 94% 96%  Weight:      Height:        Intake/Output Summary (Last 24 hours) at 10/27/2019 1920 Last data filed at 10/27/2019 1835 Gross per 24 hour  Intake 3628.23 ml  Output 3110 ml  Net 518.23 ml   Filed Weights   10/22/19 0906 10/23/19 0500 10/26/19 0500  Weight: 90.4 kg 92.1 kg 87.9 kg    Exam:  General: NAD, NGT in place, chronically ill-appearing, deconditioned  Cardiovascular: S1, S2 present  Respiratory:  Diminished breath sounds bilaterally  Abdomen: Soft, nontender, nondistended, bowel sounds present, midline dressing C/D/I  Musculoskeletal: No bilateral pedal edema noted  Skin:  Chronic venous stasis changes BLE  Psychiatry: Fair mood   Data Reviewed: CBC: Recent Labs  Lab 10/23/19 0500 10/23/19 0500 10/24/19 0414 10/25/19 0530 10/25/19 1845 10/26/19 0320 10/27/19 0508  WBC 11.9*   < > 11.4* 8.9 8.6 8.7 9.0  NEUTROABS 9.5*  --  8.6* 6.2  --  5.9 6.2  HGB 8.9*   < > 7.2* 6.2* 8.9* 9.3* 9.0*  HCT 27.6*   < > 22.6* 20.1* 27.4* 28.3* 28.6*  MCV 81.7   < > 82.2 87.8 83.0 82.5 84.6  PLT 357   < > 401* 385 392 419* 416*   < > = values in this interval not displayed.   Basic Metabolic Panel: Recent Labs  Lab 10/23/19 0500 10/24/19 0414 10/25/19 0747 10/25/19 0803 10/26/19 0320 10/27/19 0508  NA 140 137  --   143 141 139  K 4.2 4.1  --  4.7 4.3 4.3  CL 114* 106  --  109 110 108  CO2 23 22  --  26 24 21*  GLUCOSE 136* 117*  --  111* 107* 128*  BUN 31* 34*  --  35* 31* 30*  CREATININE 0.86 0.87  --  1.02 0.94 0.91  CALCIUM 7.1* 7.6*  --  7.8* 8.1* 7.7*  MG 2.0 2.3 2.4  --  2.4 2.2  PHOS 2.6 3.2 3.2  --  3.1 3.1   GFR: Estimated Creatinine Clearance: 64.4 mL/min (by C-G formula based on SCr of 0.91 mg/dL). Liver Function Tests: Recent Labs  Lab 10/23/19 0500 10/24/19 0414 10/25/19 0803 10/26/19 0320 10/27/19 0508  AST 31 33 29 29 27   ALT 42 46* 40 37 35  ALKPHOS 61 62 55 56 62  BILITOT 0.8 0.8 0.9 1.0 0.7  PROT 5.6* 6.1* 6.0* 6.6 6.3*  ALBUMIN 1.6* 1.8* 1.7* 1.8* 1.7*  No results for input(s): LIPASE, AMYLASE in the last 168 hours. No results for input(s): AMMONIA in the last 168 hours. Coagulation Profile: No results for input(s): INR, PROTIME in the last 168 hours. Cardiac Enzymes: No results for input(s): CKTOTAL, CKMB, CKMBINDEX, TROPONINI in the last 168 hours. BNP (last 3 results) No results for input(s): PROBNP in the last 8760 hours. HbA1C: No results for input(s): HGBA1C in the last 72 hours. CBG: Recent Labs  Lab 10/26/19 2331 10/27/19 0408 10/27/19 0825 10/27/19 1205 10/27/19 1720  GLUCAP 138* 120* 136* 137* 117*   Lipid Profile: Recent Labs    10/26/19 0328  TRIG 63   Thyroid Function Tests: No results for input(s): TSH, T4TOTAL, FREET4, T3FREE, THYROIDAB in the last 72 hours. Anemia Panel: No results for input(s): VITAMINB12, FOLATE, FERRITIN, TIBC, IRON, RETICCTPCT in the last 72 hours. Urine analysis:    Component Value Date/Time   COLORURINE YELLOW 10/11/2019 1202   APPEARANCEUR CLEAR 10/11/2019 1202   LABSPEC 1.024 10/11/2019 1202   PHURINE 6.0 10/11/2019 1202   GLUCOSEU NEGATIVE 10/11/2019 1202   HGBUR NEGATIVE 10/11/2019 1202   BILIRUBINUR NEGATIVE 10/11/2019 1202   KETONESUR NEGATIVE 10/11/2019 1202   PROTEINUR >=300 (A) 10/11/2019  1202   UROBILINOGEN 0.2 07/30/2007 1100   NITRITE NEGATIVE 10/11/2019 1202   LEUKOCYTESUR NEGATIVE 10/11/2019 1202   Sepsis Labs: @LABRCNTIP (procalcitonin:4,lacticidven:4)  ) Recent Results (from the past 240 hour(s))  Aerobic/Anaerobic Culture (surgical/deep wound)     Status: None   Collection Time: 10/22/19 12:57 PM   Specimen: Abdomen; Abscess  Result Value Ref Range Status   Specimen Description   Final    ABDOMEN Performed at [facility] [address] [place], [place] [address]    Special Requests   Final    NONE Performed at [facility], [place] [address] [place], [place] [address]    Gram Stain   Final    ABUNDANT WBC PRESENT, PREDOMINANTLY PMN MODERATE YEAST    Culture   Final    MODERATE CANDIDA ALBICANS NO ANAEROBES ISOLATED Performed at [facility], [place] [address], [place], [place] [address]    Report Status 10/27/2019 FINAL  Final  Culture, blood (routine x 2)     Status: None (Preliminary result)   Collection Time: 10/23/19  9:06 AM   Specimen: BLOOD  Result Value Ref Range Status   Specimen Description   Final    BLOOD RIGHT ARM Performed at [facility] [address], [place], [place] [address]    Special Requests   Final    BOTTLES DRAWN AEROBIC AND ANAEROBIC Blood Culture adequate volume Performed at [facility] [address] [place], [place] [address]    Culture   Final    NO GROWTH 4 DAYS Performed at [facility], [place] [address], [place], [place] [address]    Report Status PENDING  Incomplete  Culture, blood (routine x 2)     Status: None (Preliminary result)   Collection Time: 10/23/19  9:06 AM   Specimen: BLOOD RIGHT HAND  Result Value Ref Range Status   Specimen Description   Final    BLOOD RIGHT HAND Performed at [facility] [address], [place], [place] [address]    Special Requests   Final    BOTTLES DRAWN  AEROBIC ONLY Blood Culture adequate volume Performed at [facility] [address] [place], [place] [address]    Culture  Final    NO GROWTH 4 DAYS Performed at [facility], [place] [address], [place], [place] [address]    Report Status PENDING  Incomplete      Studies: No results found.  Scheduled Meds: . Chlorhexidine Gluconate Cloth  6 each Topical Daily  . cloNIDine  0.2 mg Transdermal Weekly  . insulin aspart  0-20 Units Subcutaneous Q4H  . insulin detemir  10 Units Subcutaneous BID  . ipratropium-albuterol  3 mL Nebulization BID  . lidocaine  1 patch Transdermal Q24H  . mouth rinse  15 mL Mouth Rinse BID  . metoprolol tartrate  5 mg Intravenous Q4H  . nitroGLYCERIN  1 inch Topical Q6H  . pantoprazole (PROTONIX) IV  40 mg Intravenous Q12H  . sodium chloride flush  10-40 mL Intracatheter Q12H  . sodium chloride flush  5 mL Intracatheter Q8H    Continuous Infusions: . sodium chloride 10 mL/hr at 10/27/19 1835  . sodium chloride Stopped (10/20/19 2021)  . fluconazole (DIFLUCAN) IV 100 mL/hr at 10/27/19 1835  . heparin 1,200 Units/hr (10/27/19 1835)  . piperacillin-tazobactam (ZOSYN)  IV Stopped (10/27/19 1716)  . TPN ADULT (ION) 95 mL/hr at 10/27/19 1835     LOS: 15 days     Alma Friendly, MD [facility]  If 7PM-7AM, please contact night-coverage [email or web address] 10/27/2019, 7:20 PM

## 2019-10-27 NOTE — Progress Notes (Signed)
Pharmacy Consult for Heparin Indication: chest pain/ACS, left upper extremity DVT  Heparin level 0.27, slightly subtherapeutic (goal 0.3-0.5) on heparin 1150 unit/hr.  No bleeding or issues with infusion per d/w RN.  Plan: - increase heparin drip slightly 1200 units/hr - recheck heparin level in [place], PharmD, [place]: [phone] 10/27/2019 6:01 PM

## 2019-10-27 NOTE — Progress Notes (Signed)
Assessment & Plan: POD#15 - Perforated Duodenal Ulcer S/p Exploratory laparotomy, omental patch repair of duodenal ulcer - Dr. [name] - 10/12/2019 Continue NG, NPO; allow ice chips IR drain with purulent fluid - continue to monitor IV Zosyn TNA Wound care - open with dressing changes  Multiple BM's after suppositories  Continue NPO, NG decompression, IR drain in place. Again discussed withdaughterat bedside this AM. Plan repeat UGI series tomorrow to assess healing of perforated ulcer after [place].  If no leakage, will remove NG and allow clear liquid diet.  Continue perc drainage and monitor quality and quantity of output.        [name], MD       [facility]       Office: [phone]   Chief Complaint: Perforated ulcer  Subjective: Patient in bed, daughter at bedside.  Complains about NG causing discomfort.  BM with suppository.  Objective: Vital signs in last 24 hours: Temp:  [97.6 F (36.4 C)-99.8 F (37.7 C)] 98.9 F (37.2 C) (06/29 0400) Pulse Rate:  [73-88] 80 (06/29 0700) Resp:  [15-23] 20 (06/29 0700) BP: (115-158)/(50-88) 136/88 (06/29 0700) SpO2:  [92 %-100 %] 94 % (06/29 0700) Last BM Date: 10/26/19  Intake/Output from previous day: 06/28 0701 - 06/29 0700 In: 3092.5 [P.O.:140; I.V.:2604.4; IV Piggyback:348.1] Out: 3335 [Urine:2625; Emesis/NG output:700; Drains:10] Intake/Output this shift: No intake/output data recorded.  Physical Exam: HEENT - sclerae clear, mucous membranes moist Neck - soft Abdomen - midline dressing intact; drain with purulent output; NG with thin bilious   Lab Results:  Recent Labs    10/26/19 0320 10/27/19 0508  WBC 8.7 9.0  HGB 9.3* 9.0*  HCT 28.3* 28.6*  PLT 419* 416*   BMET Recent Labs    10/26/19 0320 10/27/19 0508  NA 141 139  K 4.3 4.3  CL 110 108  CO2 24 21*  GLUCOSE 107* 128*  BUN 31* 30*    CREATININE 0.94 0.91  CALCIUM 8.1* 7.7*   PT/INR No results for input(s): LABPROT, INR in the last 72 hours. Comprehensive Metabolic Panel:    Component Value Date/Time   NA 139 10/27/2019 0508   NA 141 10/26/2019 0320   NA 137 11/02/2016 1509   NA 137 06/22/2015 1421   K 4.3 10/27/2019 0508   K 4.3 10/26/2019 0320   K 4.4 11/02/2016 1509   K 4.8 06/22/2015 1421   CL 108 10/27/2019 0508   CL 110 10/26/2019 0320   CO2 21 (L) 10/27/2019 0508   CO2 24 10/26/2019 0320   CO2 27 11/02/2016 1509   CO2 25 06/22/2015 1421   BUN 30 (H) 10/27/2019 0508   BUN 31 (H) 10/26/2019 0320   BUN 16.7 11/02/2016 1509   BUN 17.7 06/22/2015 1421   CREATININE 0.91 10/27/2019 0508   CREATININE 0.94 10/26/2019 0320   CREATININE 0.9 11/02/2016 1509   CREATININE 0.9 06/22/2015 1421   GLUCOSE 128 (H) 10/27/2019 0508   GLUCOSE 107 (H) 10/26/2019 0320   GLUCOSE 153 (H) 11/02/2016 1509   GLUCOSE 126 06/22/2015 1421   CALCIUM 7.7 (L) 10/27/2019 0508   CALCIUM 8.1 (L) 10/26/2019 0320   CALCIUM 9.1 11/02/2016 1509   CALCIUM 9.3 06/22/2015 1421   AST 27 10/27/2019 0508   AST 29 10/26/2019 0320   AST 15 11/02/2016 1509   AST 19 06/22/2015 1421   ALT 35 10/27/2019 0508   ALT 37 10/26/2019 0320   ALT 12 11/02/2016 1509  ALT 18 06/22/2015 1421   ALKPHOS 62 10/27/2019 0508   ALKPHOS 56 10/26/2019 0320   ALKPHOS 58 11/02/2016 1509   ALKPHOS 71 06/22/2015 1421   BILITOT 0.7 10/27/2019 0508   BILITOT 1.0 10/26/2019 0320   BILITOT 0.71 11/02/2016 1509   BILITOT 0.40 06/22/2015 1421   PROT 6.3 (L) 10/27/2019 0508   PROT 6.6 10/26/2019 0320   PROT 7.0 11/02/2016 1509   PROT 6.9 06/22/2015 1421   PROT 7.4 06/22/2015 1421   ALBUMIN 1.7 (L) 10/27/2019 0508   ALBUMIN 1.8 (L) 10/26/2019 0320   ALBUMIN 3.6 11/02/2016 1509   ALBUMIN 3.8 06/22/2015 1421    Studies/Results: DG Chest Port 1 View  Result Date: 10/26/2019 CLINICAL DATA:  Acute respiratory failure with hypoxia, history diabetes mellitus,  prostate cancer, hypertension EXAM: PORTABLE CHEST 1 VIEW COMPARISON:  Portable exam 1121 hours compared to 10/20/2019 FINDINGS: Nasogastric tube extends into stomach. LEFT arm PICC line tip projecting over high RIGHT atrium. Upper normal heart size. Mediastinal contours and pulmonary vascularity normal. Low lung volumes with bibasilar atelectasis. No acute infiltrate, pleural effusion or pneumothorax. IMPRESSION: Decreased lung volumes with bibasilar atelectasis. Electronically Signed   By: Lavonia Dana M.D.   On: 10/26/2019 11:38      [name] 10/27/2019  Patient ID: Matthew Schwartz, male   DOB: Aug 31, 1932, 86 y.o.   MRN: [id number]

## 2019-10-27 NOTE — TOC Progression Note (Signed)
Transition of Care [facility]) - Progression Note    Patient Details  Name: Matthew Schwartz MRN: [id number] Date of Birth: 02-17-1933  Transition of Care [facility]) CM/SW Contact  Leeroy Cha, RN Phone Number: 10/27/2019, 8:42 AM  Clinical Narrative:    Continued with npo and ng tube to suction, IR drain with purlenty drainage,Awake and alert, abd wound open with dressing changes, POD#15. Plan follow for dc needs and progression.   Expected Discharge Plan: IP Rehab Facility Barriers to Discharge: Continued Medical Work up  Expected Discharge Plan and Services Expected Discharge Plan: [place]   Discharge Planning Services: CM Consult   Living arrangements for the past 2 months: Single Family Home                                       Social Determinants of Health (SDOH) Interventions    Readmission Risk Interventions No flowsheet data found.

## 2019-10-27 NOTE — Progress Notes (Signed)
[place] for Heparin Indication: chest pain/ACS, left upper extremity DVT  Allergies  Allergen Reactions  . Losartan Potassium Other (See Comments)    Unknown reaction that happened 15 years ago   Patient Measurements: Height: 5\' 11"  (180.3 cm) (per daughter) Weight: 87.9 kg (193 lb 12.6 oz) IBW/kg (Calculated) : 75.3 Heparin Dosing Weight: 94 kg  Vital Signs: Temp: 98.9 F (37.2 C) (06/29 0400) Temp Source: Axillary (06/29 0400) BP: 136/88 (06/29 0700) Pulse Rate: 80 (06/29 0700)  Labs: Recent Labs    10/25/19 0530 10/25/19 0531 10/25/19 0803 10/25/19 1845 10/25/19 1845 10/26/19 0320 10/27/19 0508  HGB   < >  --   --  8.9*   < > 9.3* 9.0*  HCT   < >  --   --  27.4*  --  28.3* 28.6*  PLT   < >  --   --  392  --  419* 416*  HEPARINUNFRC  --  0.76*  --   --   --   --   --   CREATININE  --   --  1.02  --   --  0.94 0.91   < > = values in this interval not displayed.   Estimated Creatinine Clearance: 64.4 mL/min (by C-G formula based on SCr of 0.91 mg/dL).  Medications:  Infusions:  . sodium chloride 10 mL/hr at 10/26/19 2100  . sodium chloride Stopped (10/20/19 2021)  . fluconazole (DIFLUCAN) IV Stopped (10/26/19 2001)  . heparin    . piperacillin-tazobactam (ZOSYN)  IV 3.375 g (10/27/19 0510)  . TPN ADULT (ION) 95 mL/hr at 10/26/19 2100  . TPN ADULT (ION)     Assessment: 86 yo male with perforated duodenal ulcer s/p omental patch repair.  Pharmacy consulted to dose IV heparin for NSTEMI as well as for acute deep vein thrombosis involving the left axillary vein, surrounding the PICC line. Findings consistent with acute superficial vein thrombosis involving the left cephalic vein. Patient with anemia of critical illenss/post-op s/p transfusion 6/27.   Today, 10/27/2019  Hgb low, stable, platelets OK  Transfused 6/27  Goal of Therapy:  Heparin level 0.3-0.5 units/ml Monitor platelets by anticoagulation protocol: Yes    Plan:  Resume heparin at lowest rate possible per MD.  Resume heparin at 1150 units/hr Check HL in 8 hours Daily heparin level and CBC Continue to monitor for signs/symptoms of bleeding  Ulice Dash, PharmD, BCPS 10/27/2019, 7:59 AM

## 2019-10-27 NOTE — Progress Notes (Signed)
Progress Note  Patient Name: Matthew Schwartz Date of Encounter: 10/27/2019  Primary Cardiologist: Candee Furbish, MD   Subjective   Received medication for sleep this AM, daughter at bedside. Hopeful that GI series tomorrow will allow NG tube to be removed, as this has been bothersome for patient. No chest pain, breathing stable. Abdominal pain controlled.  Inpatient Medications    Scheduled Meds:  Chlorhexidine Gluconate Cloth  6 each Topical Daily   cloNIDine  0.2 mg Transdermal Weekly   insulin aspart  0-20 Units Subcutaneous Q4H   insulin detemir  10 Units Subcutaneous BID   ipratropium-albuterol  3 mL Nebulization BID   lidocaine  1 patch Transdermal Q24H   mouth rinse  15 mL Mouth Rinse BID   metoprolol tartrate  5 mg Intravenous Q4H   nitroGLYCERIN  1 inch Topical Q6H   pantoprazole (PROTONIX) IV  40 mg Intravenous Q12H   sodium chloride flush  10-40 mL Intracatheter Q12H   sodium chloride flush  5 mL Intracatheter Q8H   Continuous Infusions:  sodium chloride 10 mL/hr at 10/27/19 0722   sodium chloride Stopped (10/20/19 2021)   fluconazole (DIFLUCAN) IV Stopped (10/26/19 2001)   heparin 1,150 Units/hr (10/27/19 0912)   piperacillin-tazobactam (ZOSYN)  IV Stopped (10/27/19 0913)   TPN ADULT (ION) 95 mL/hr at 10/27/19 0722   TPN ADULT (ION)     PRN Meds: sodium chloride, acetaminophen **OR** acetaminophen, hydrALAZINE, ipratropium-albuterol, lip balm, LORazepam, morphine injection, ondansetron (ZOFRAN) IV, silver nitrate applicators, sodium chloride flush   Vital Signs    Vitals:   10/27/19 0600 10/27/19 0700 10/27/19 0800 10/27/19 0902  BP: (!) 117/59 136/88 (!) 150/60   Pulse: 73 80 79   Resp: 20 20 (!) 22   Temp:   (!) 97.4 F (36.3 C)   TempSrc:   Oral   SpO2: 95% 94% 94% 97%  Weight:      Height:        Intake/Output Summary (Last 24 hours) at 10/27/2019 1025 Last data filed at 10/27/2019 2426 Gross per 24 hour  Intake 3362.61 ml  Output  3335 ml  Net 27.61 ml   Filed Weights   10/22/19 0906 10/23/19 0500 10/26/19 0500  Weight: 90.4 kg 92.1 kg 87.9 kg    Telemetry    NSR, rare PVCs - Personally Reviewed  ECG    No new tracings - Personally Reviewed  Physical Exam   GEN: ill appearing gentleman in no acute distress. NG tube in place NECK: No JVD visible CARDIAC: regular rhythm, normal S1 and S2, no rubs or gallops. No murmur. RESPIRATORY:  Clear to auscultation without rales, wheezing or rhonchi anteriorly ABDOMEN: mildly tender, slightly distended SKIN: Warm and dry, trivial LE edema NEUROLOGIC:  nonfocal PSYCHIATRIC:  Normal affect   Labs    Chemistry Recent Labs  Lab 10/25/19 0803 10/26/19 0320 10/27/19 0508  NA 143 141 139  K 4.7 4.3 4.3  CL 109 110 108  CO2 26 24 21*  GLUCOSE 111* 107* 128*  BUN 35* 31* 30*  CREATININE 1.02 0.94 0.91  CALCIUM 7.8* 8.1* 7.7*  PROT 6.0* 6.6 6.3*  ALBUMIN 1.7* 1.8* 1.7*  AST 29 29 27   ALT 40 37 35  ALKPHOS 55 56 62  BILITOT 0.9 1.0 0.7  GFRNONAA >60 >60 >60  GFRAA >60 >60 >60  ANIONGAP 8 7 10      Hematology Recent Labs  Lab 10/25/19 1845 10/26/19 0320 10/27/19 0508  WBC 8.6 8.7 9.0  RBC 3.30*  3.43* 3.38*  HGB 8.9* 9.3* 9.0*  HCT 27.4* 28.3* 28.6*  MCV 83.0 82.5 84.6  MCH 27.0 27.1 26.6  MCHC 32.5 32.9 31.5  RDW 16.3* 16.4* 17.1*  PLT 392 419* 416*    Cardiac EnzymesNo results for input(s): TROPONINI in the last 168 hours. No results for input(s): TROPIPOC in the last 168 hours.   BNPNo results for input(s): BNP, PROBNP in the last 168 hours.   DDimer No results for input(s): DDIMER in the last 168 hours.   Radiology    DG Chest Port 1 View  Result Date: 10/26/2019 CLINICAL DATA:  Acute respiratory failure with hypoxia, history diabetes mellitus, prostate cancer, hypertension EXAM: PORTABLE CHEST 1 VIEW COMPARISON:  Portable exam 1121 hours compared to 10/20/2019 FINDINGS: Nasogastric tube extends into stomach. LEFT arm PICC line tip  projecting over high RIGHT atrium. Upper normal heart size. Mediastinal contours and pulmonary vascularity normal. Low lung volumes with bibasilar atelectasis. No acute infiltrate, pleural effusion or pneumothorax. IMPRESSION: Decreased lung volumes with bibasilar atelectasis. Electronically Signed   By: Lavonia Dana M.D.   On: 10/26/2019 11:38    Cardiac Studies   Echo 10/16/19 1. Image quality remains technically difficult, making wall motion  evaluation challenging. There is global left ventricular hypokinesis, but  there appears to be disproportionately severe inferolateral wall  hypokinesis. Left ventricular ejection  fraction, by estimation, is 40 to 45%. The left ventricle has mildly  decreased function. The left ventricle has no regional wall motion  abnormalities. Left ventricular diastolic parameters are consistent with  Grade I diastolic dysfunction (impaired  relaxation).  2. Right ventricular systolic function is normal. The right ventricular  size is normal.  3. The mitral valve is normal in structure. No evidence of mitral valve  regurgitation. No evidence of mitral stenosis.  4. The aortic valve is normal in structure. Aortic valve regurgitation is  not visualized. No aortic stenosis is present.  5. The inferior vena cava is normal in size with greater than 50%  respiratory variability, suggesting right atrial pressure of 3 mmHg.   Patient Profile     86 y.o. male with PMH CAD s/p balloon angioplasty (no stent) to RCA in 2000, hypertension, hyperlipidemia, type II diabetes who was admitted for perforated duodenal ulcer and septic shock 2/2 peritonitis. Cardiology consulted for NSVT and ECG changes as well as elevated troponin.  Assessment & Plan    1. Acute combined systolic and diastolic heart failure: EF 40-45% on echo this admission. Previously remote NST in 2007 reportedly with EF 59% per Dr. Marlou Porch. Unclear etiology at this point. Medical management has been  limited by NPO status following repair of a perforated duodenal ulcer c/b septic shock 2/2 peritonitis. Volume status appears stable. - Continue IV BBlocker with plans to transition to metoprolol succinate when tolerating PO.  - No plans for ischemic evaluation at this time (see below).  2. Elevated troponin in patient with known CAD: HsTrop 48 on admission, peaked at 1022 and trended down. Unclear if trop elevation is 2/2 demand ischemia vs NSTEMI. Risk of cath outweighed benefits at this time given lack of symptoms, age, anemia, and acute medical illness. Patient and family in agreement with this - would only do urgent cath if he has severe symptoms that fail medical management, high risk arrhythmia, or STEMI given current comorbid medical issues - Would restart home atorvastatin when tolerating po - Hopeful to resume home metoprolol succinate, amlodipine, and imdur when tolerating po for antianginal effects.  -  Would restart aspirin 81mg  daily when Hgb stabilizes and he is cleared by surgery to do so.   3. NSVT: episodes have settled with BBlocker use.  - Anticipate transition to po BBlocker when tolerating po  4. HTN: BP is intermittently elevated on IV metoprolol and clonidine patch. Home amlodipine, metoprolol succinate, ramipril, and imdur are on hold.  - Hopeful to transition back to home regimen when tolerating po  5. DVT: found to have an acute left axillary vein thrombus surrounding PICC line and supervicial thrombus in left cephalic vein. Management has been limited by anemia requiring transfusion, therefore anticoagulation is on hold. - Continue management per primary team  6. Perforated duodenal ulcer s/p patch repair: Surgery following. Planning for upper GI series tomorrow to assess healing - if no leakage, anticipate removing NG tube and allowing clear liquid diet.  - Continue management per surgery and primary team.   For questions or updates, please contact [facility] Please consult [email or web address] for contact info under Cardiology/STEMI.     Signed, Buford Dresser, MD  10/27/2019, 10:25 AM

## 2019-10-28 ENCOUNTER — Inpatient Hospital Stay (HOSPITAL_COMMUNITY): Payer: Medicare Other

## 2019-10-28 DIAGNOSIS — R1013 Epigastric pain: Secondary | ICD-10-CM

## 2019-10-28 LAB — GLUCOSE, CAPILLARY
Glucose-Capillary: 128 mg/dL — ABNORMAL HIGH (ref 70–99)
Glucose-Capillary: 118 mg/dL — ABNORMAL HIGH (ref 70–99)
Glucose-Capillary: 148 mg/dL — ABNORMAL HIGH (ref 70–99)
Glucose-Capillary: 134 mg/dL — ABNORMAL HIGH (ref 70–99)
Glucose-Capillary: 147 mg/dL — ABNORMAL HIGH (ref 70–99)

## 2019-10-28 LAB — BASIC METABOLIC PANEL
Anion gap: 9 (ref 5–15)
BUN: 28 mg/dL — ABNORMAL HIGH (ref 8–23)
CO2: 23 mmol/L (ref 22–32)
Calcium: 8 mg/dL — ABNORMAL LOW (ref 8.9–10.3)
Chloride: 106 mmol/L (ref 98–111)
Creatinine, Ser: 0.98 mg/dL (ref 0.61–1.24)
GFR calc Af Amer: >60 mL/min (ref >60)
GFR calc non Af Amer: >60 mL/min (ref >60)
Glucose, Bld: 113 mg/dL — ABNORMAL HIGH (ref 70–99)
Potassium: 4.5 mmol/L (ref 3.5–5.1)
Sodium: 138 mmol/L (ref 135–145)

## 2019-10-28 LAB — CBC WITH DIFFERENTIAL/PLATELET
Abs Immature Granulocytes: 0.27 10*3/uL — ABNORMAL HIGH (ref 0.00–0.07)
Basophils Absolute: 0 10*3/uL (ref 0.0–0.1)
Basophils Relative: 0 %
Eosinophils Absolute: 0.3 10*3/uL (ref 0.0–0.5)
Eosinophils Relative: 4 %
HCT: 27.7 % — ABNORMAL LOW (ref 39.0–52.0)
Hemoglobin: 8.9 g/dL — ABNORMAL LOW (ref 13.0–17.0)
Immature Granulocytes: 3 %
Lymphocytes Relative: 15 %
Lymphs Abs: 1.3 10*3/uL (ref 0.7–4.0)
MCH: 28 pg (ref 26.0–34.0)
MCHC: 32.1 g/dL (ref 30.0–36.0)
MCV: 87.1 fL (ref 80.0–100.0)
Monocytes Absolute: 0.9 10*3/uL (ref 0.1–1.0)
Monocytes Relative: 10 %
Neutro Abs: 5.9 10*3/uL (ref 1.7–7.7)
Neutrophils Relative %: 68 %
Platelets: 403 10*3/uL — ABNORMAL HIGH (ref 150–400)
RBC: 3.18 MIL/uL — ABNORMAL LOW (ref 4.22–5.81)
RDW: 17.5 % — ABNORMAL HIGH (ref 11.5–15.5)
WBC: 8.7 10*3/uL (ref 4.0–10.5)
nRBC: 0 % (ref 0.0–0.2)

## 2019-10-28 LAB — HEPARIN LEVEL (UNFRACTIONATED)
Heparin Unfractionated: 0.46 IU/mL (ref 0.30–0.70)
Heparin Unfractionated: 0.54 IU/mL (ref 0.30–0.70)
Heparin Unfractionated: 0.38 IU/mL (ref 0.30–0.70)

## 2019-10-28 LAB — CULTURE, BLOOD (ROUTINE X 2)
Culture: NO GROWTH
Culture: NO GROWTH
Special Requests: ADEQUATE
Special Requests: ADEQUATE

## 2019-10-28 LAB — MAGNESIUM: Magnesium: 2.2 mg/dL (ref 1.7–2.4)

## 2019-10-28 LAB — PHOSPHORUS: Phosphorus: 3 mg/dL (ref 2.5–4.6)

## 2019-10-28 MED ORDER — TRAVASOL 10 % IV SOLN
INTRAVENOUS | Status: AC
  Filled 2019-10-28: qty 1208.4

## 2019-10-28 MED ORDER — HEPARIN (PORCINE) 25000 UT/250ML-% IV SOLN
1100.0000 [IU]/h | INTRAVENOUS | Status: DC
  Administered 2019-10-29: 1100 [IU]/h via INTRAVENOUS
  Filled 2019-10-28: qty 250

## 2019-10-28 MED ORDER — IOHEXOL 300 MG/ML  SOLN
300.0000 mL | Freq: Once | INTRAMUSCULAR | Status: AC | PRN
  Administered 2019-10-28: 200 mL

## 2019-10-28 MED ORDER — INSULIN ASPART 100 UNIT/ML ~~LOC~~ SOLN
0.0000 [IU] | Freq: Four times a day (QID) | SUBCUTANEOUS | Status: DC
  Administered 2019-10-28 – 2019-10-29 (×4): 3 [IU] via SUBCUTANEOUS
  Administered 2019-10-29: 4 [IU] via SUBCUTANEOUS
  Administered 2019-10-30 – 2019-10-31 (×5): 3 [IU] via SUBCUTANEOUS
  Administered 2019-10-31: 4 [IU] via SUBCUTANEOUS
  Administered 2019-11-01 – 2019-11-03 (×5): 3 [IU] via SUBCUTANEOUS
  Administered 2019-11-05: 1 [IU] via SUBCUTANEOUS
  Administered 2019-11-08: 4 [IU] via SUBCUTANEOUS
  Administered 2019-11-08 – 2019-11-09 (×2): 3 [IU] via SUBCUTANEOUS
  Administered 2019-11-09 (×4): 4 [IU] via SUBCUTANEOUS
  Administered 2019-11-10 – 2019-11-13 (×6): 3 [IU] via SUBCUTANEOUS

## 2019-10-28 NOTE — Progress Notes (Signed)
PHARMACY - TOTAL PARENTERAL NUTRITION CONSULT NOTE   Indication: Prolonged ileus  Patient Measurements: Height: 5\' 11"  (180.3 cm) (per daughter) Weight: 88.1 kg (194 lb 3.6 oz) IBW/kg (Calculated) : 75.3   Body mass index is 27.09 kg/m. Usual Weight: 90.7 kg   Assessment:  - Pharmacy is consulted to start TPN in 86 yo male with prolonged ileus. Pt underwent exploratory laparotomy on 6/14 which revealed perforated duodenal ulcer.   Glucose / Insulin: hx of DM - on metformin PTA.  - CBGs < 150 with 60 units/bag insulin in TPN, rSSI q4h (range 118-134 ; goal 100-150) - 12 units of SS insulin required yesterday - Levemir 10 units BID per CCM started 6/18  Electrolytes: K (target >/= 4.0) is 4.5. Magnesium 2.2 (target >/= 2.0); others wnl Renal: SCr wnl,  BUN 28 mproved. 2.5L UOP last 24hr LFTs / TGs: LFTs remain WNL, Tbili improved to WNL after procedure; TG are now WNL after stopping propofol. Previously elevated level likely spurious  Prealbumin / albumin: prealbumin 9.6 (6/28), 9.0 (6/21); albumin 1.7 Intake / Output; MIVF: NS at [facility]; NG output 525 ml charted , UOP 2.5 L GI Imaging:  - 6/21 Upper GI series: small leak from superior margin of duodenal bulb. - 6/23 CT abdomen: 4.3 cm air-fluid collection in the right upper quadrant of the abdomen between the duodenal bulb and gallbladder, not containing enteric contrast. No residual leak from the duodenal bulb identified. Surgeries / Procedures:  - 6/14 Exploratory laparotomy which revealed perforated duodenal ulcer - 6/24 RUQ drain placed > 10 ml > cultured - 6/25 removal R abd JP drain   Central access:  PICC Line 6/15 TPN start date: 6/15  Nutritional Goals:   (per RD recommendation on 6/15): Kcal:  2200-2400 Protein:  115-125 grams Fluid:  >2L/d Goal TPN rate is 95 mL/hr (provides 121 g of protein and 2262 kcals per day)  Current Nutrition:  NPO   Plan:   Continue TPN at 95 mL/hr at 1800, goal rate   Electrolytes  in TPN: 80 mEq/L of Na, 80 mEq/L of K (max in TPN per [facility] policy) , 4 mEq/L of Ca, 12 mEq/L of Magnesium, and 10 mmol/L of Phos. Cl:Ac ratio max Ac  Standard MVI and trace elements to TPN  Decrease to resistant q6h SSI, and monitor Levemir and adjust as needed   Continue 60 units of regular insulin/day in TPN   Continue MIVF to KVO mL/hr at 1800  Monitor TPN labs on Mon/Thurs   Adrian Saran, PharmD, BCPS Phone [phone] until 3pm 10/28/2019 10:24 AM

## 2019-10-28 NOTE — Progress Notes (Addendum)
Progress Note  Patient Name: Matthew Schwartz Date of Encounter: 10/28/2019  Primary Cardiologist: Candee Furbish, MD   Subjective   Patient sleeping again today, daughter states he had a rough night. She is asking about heart tests that were abnormal this morning--reviewed that there has not been any additional testing beyond what we have discussed. Awaiting imaging to determine if NG tube can be removed.  Inpatient Medications    Scheduled Meds: . Chlorhexidine Gluconate Cloth  6 each Topical Daily  . cloNIDine  0.2 mg Transdermal Weekly  . insulin aspart  0-20 Units Subcutaneous Q6H  . insulin detemir  10 Units Subcutaneous BID  . ipratropium-albuterol  3 mL Nebulization BID  . lidocaine  1 patch Transdermal Q24H  . mouth rinse  15 mL Mouth Rinse BID  . metoprolol tartrate  5 mg Intravenous Q4H  . nitroGLYCERIN  1 inch Topical Q6H  . pantoprazole (PROTONIX) IV  40 mg Intravenous Q12H  . sodium chloride flush  10-40 mL Intracatheter Q12H  . sodium chloride flush  5 mL Intracatheter Q8H   Continuous Infusions: . sodium chloride 10 mL/hr at 10/28/19 0628  . sodium chloride Stopped (10/20/19 2021)  . fluconazole (DIFLUCAN) IV Stopped (10/27/19 1924)  . heparin 1,200 Units/hr (10/28/19 0825)  . piperacillin-tazobactam (ZOSYN)  IV Stopped (10/28/19 0824)  . TPN ADULT (ION) 95 mL/hr at 10/28/19 1275  . TPN ADULT (ION)     PRN Meds: sodium chloride, acetaminophen **OR** acetaminophen, hydrALAZINE, ipratropium-albuterol, lip balm, LORazepam, morphine injection, ondansetron (ZOFRAN) IV, silver nitrate applicators, sodium chloride flush   Vital Signs    Vitals:   10/28/19 0800 10/28/19 0827 10/28/19 0900 10/28/19 1000  BP: (!) 149/67  (!) 154/62 (!) 124/58  Pulse: 91  92 90  Resp: (!) 25  (!) 25 (!) 24  Temp: 97.9 F (36.6 C)     TempSrc: Oral     SpO2: 94% 94% 95% 94%  Weight:      Height:        Intake/Output Summary (Last 24 hours) at 10/28/2019 1106 Last data filed at  10/28/2019 0816 Gross per 24 hour  Intake 2896.81 ml  Output 2755 ml  Net 141.81 ml   Filed Weights   10/23/19 0500 10/26/19 0500 10/28/19 0500  Weight: 92.1 kg 87.9 kg 88.1 kg    Telemetry    NSR, rare PVCs - Personally Reviewed  ECG    No new tracings - Personally Reviewed  Physical Exam   GEN: ill appearing gentleman, resting comfortably. NG tube in place NECK: No JVD visible CARDIAC: regular rhythm, normal S1 and S2, no rubs or gallops. No murmur. VASCULAR: Radial and DP pulses 2+ bilaterally. No carotid bruits RESPIRATORY:  Clear to auscultation without rales, wheezing or rhonchi anteriorly ABDOMEN: mildly tender, slightly distended. See image in surgery note from today. SKIN: Warm and dry, trivial LE edema NEUROLOGIC:  No focal neuro deficits noted. PSYCHIATRIC:  Normal affect   Labs    Chemistry Recent Labs  Lab 10/25/19 0803 10/25/19 0803 10/26/19 0320 10/27/19 0508 10/28/19 0759  NA 143   < > 141 139 138  K 4.7   < > 4.3 4.3 4.5  CL 109   < > 110 108 106  CO2 26   < > 24 21* 23  GLUCOSE 111*   < > 107* 128* 113*  BUN 35*   < > 31* 30* 28*  CREATININE 1.02   < > 0.94 0.91 0.98  CALCIUM 7.8*   < >  8.1* 7.7* 8.0*  PROT 6.0*  --  6.6 6.3*  --   ALBUMIN 1.7*  --  1.8* 1.7*  --   AST 29  --  29 27  --   ALT 40  --  37 35  --   ALKPHOS 55  --  56 62  --   BILITOT 0.9  --  1.0 0.7  --   GFRNONAA >60   < > >60 >60 >60  GFRAA >60   < > >60 >60 >60  ANIONGAP 8   < > 7 10 9    < > = values in this interval not displayed.     Hematology Recent Labs  Lab 10/26/19 0320 10/27/19 0508 10/28/19 0433  WBC 8.7 9.0 8.7  RBC 3.43* 3.38* 3.18*  HGB 9.3* 9.0* 8.9*  HCT 28.3* 28.6* 27.7*  MCV 82.5 84.6 87.1  MCH 27.1 26.6 28.0  MCHC 32.9 31.5 32.1  RDW 16.4* 17.1* 17.5*  PLT 419* 416* 403*    Cardiac EnzymesNo results for input(s): TROPONINI in the last 168 hours. No results for input(s): TROPIPOC in the last 168 hours.   BNPNo results for input(s): BNP,  PROBNP in the last 168 hours.   DDimer No results for input(s): DDIMER in the last 168 hours.   Radiology    DG Chest Port 1 View  Result Date: 10/26/2019 CLINICAL DATA:  Acute respiratory failure with hypoxia, history diabetes mellitus, prostate cancer, hypertension EXAM: PORTABLE CHEST 1 VIEW COMPARISON:  Portable exam 1121 hours compared to 10/20/2019 FINDINGS: Nasogastric tube extends into stomach. LEFT arm PICC line tip projecting over high RIGHT atrium. Upper normal heart size. Mediastinal contours and pulmonary vascularity normal. Low lung volumes with bibasilar atelectasis. No acute infiltrate, pleural effusion or pneumothorax. IMPRESSION: Decreased lung volumes with bibasilar atelectasis. Electronically Signed   By: Lavonia Dana M.D.   On: 10/26/2019 11:38    Cardiac Studies   Echo 10/16/19 1. Image quality remains technically difficult, making wall motion  evaluation challenging. There is global left ventricular hypokinesis, but  there appears to be disproportionately severe inferolateral wall  hypokinesis. Left ventricular ejection  fraction, by estimation, is 40 to 45%. The left ventricle has mildly  decreased function. The left ventricle has no regional wall motion  abnormalities. Left ventricular diastolic parameters are consistent with  Grade I diastolic dysfunction (impaired  relaxation).  2. Right ventricular systolic function is normal. The right ventricular  size is normal.  3. The mitral valve is normal in structure. No evidence of mitral valve  regurgitation. No evidence of mitral stenosis.  4. The aortic valve is normal in structure. Aortic valve regurgitation is  not visualized. No aortic stenosis is present.  5. The inferior vena cava is normal in size with greater than 50%  respiratory variability, suggesting right atrial pressure of 3 mmHg.   Patient Profile     86 y.o. male with PMH CAD s/p balloon angioplasty (no stent) to RCA in 2000, hypertension,  hyperlipidemia, type II diabetes who was admitted for perforated duodenal ulcer and septic shock 2/2 peritonitis. Cardiology consulted for NSVT and ECG changes as well as elevated troponin.  Assessment & Plan    Acute combined systolic and diastolic heart failure: EF 40-45% on echo this admission. Previously remote NST in 2007 reportedly with EF 59% per Dr. Marlou Porch. Unclear etiology at this point. Medical management has been limited by NPO status following repair of a perforated duodenal ulcer c/b septic shock 2/2 peritonitis.  -  Continue IV BBlocker with plans to transition to metoprolol succinate when tolerating PO. May be able to start today based on results of planned imaging - No plans for ischemic evaluation at this time (see below). No new cardiac results, discussed with daughter again today what we known thus far. - continue plans for medical management inpatient, likely ischemic evaluation as an outpatient once recovered   NSVT: no significant recent episodes - Anticipate transition to po BBlocker when tolerating po  NO CHANGE TODAY:  Elevated troponin in patient with known CAD: HsTrop 48 on admission, peaked at 1022 and trended down. Unclear if trop elevation is 2/2 demand ischemia vs NSTEMI. Risk of cath outweighed benefits given lack of symptoms, age, anemia, and acute medical illness. Patient and family in agreement with this - would only do urgent cath if he has severe symptoms that fail medical management, high risk arrhythmia, or STEMI given current comorbid medical issues - Would restart home atorvastatin when tolerating po - Hopeful to resume home metoprolol succinate, amlodipine, and imdur when tolerating po for antianginal effects.  - Would restart aspirin 81mg  daily when Hgb stabilizes and he is cleared by surgery to do so.   HTN: BP is intermittently elevated on IV metoprolol and clonidine patch. Home amlodipine, metoprolol succinate, ramipril, and imdur are on hold.  -  Hopeful to transition back to home regimen when tolerating po  DVT: found to have an acute left axillary vein thrombus surrounding PICC line and supervicial thrombus in left cephalic vein. Management has been limited by anemia requiring transfusion -started on heparin drip, tolerating at this time - Continue management per primary team  Perforated duodenal ulcer s/p patch repair: Surgery following. Planning for upper GI series tomorrow to assess healing - if no leakage, anticipate removing NG tube and allowing clear liquid diet.  - Continue management per surgery and primary team.   For questions or updates, please contact [facility] Please consult [email or web address] for contact info under Cardiology/STEMI.     Signed, Buford Dresser, MD  10/28/2019, 11:06 AM

## 2019-10-28 NOTE — Progress Notes (Signed)
Daily Progress Note   Patient Name: Matthew Schwartz       Date: 10/28/2019 DOB: [birth year]  Age: 86 y.o. MRN#: [id number] Attending Physician: Nita Sells, MD Primary Care Physician: [name], MD Admit Date: 10/12/2019  Reason for Consultation/Follow-up: Establishing goals of care  Subjective: I met briefly with Matthew Schwartz daughter today.  He was resting during encounter and she reports that he had a "rough night" as he has been having pain in his arm following attempt at lab draw overnight that resulted in him having pain and not being able to sleep.  She was hoping that he would be able to work more with PT today as he had a very good day yesterday.  Family hopeful that he will be evaluated/accepted to CIR once he is closer to discharge.  Discussed plan to await results of upper GI and continue to assess how he continues to progress.  Length of Stay: 16  Current Medications: Scheduled Meds:   Chlorhexidine Gluconate Cloth  6 each Topical Daily   cloNIDine  0.2 mg Transdermal Weekly   insulin aspart  0-20 Units Subcutaneous Q6H   insulin detemir  10 Units Subcutaneous BID   lidocaine  1 patch Transdermal Q24H   mouth rinse  15 mL Mouth Rinse BID   metoprolol tartrate  5 mg Intravenous Q4H   nitroGLYCERIN  1 inch Topical Q6H   pantoprazole (PROTONIX) IV  40 mg Intravenous Q12H   sodium chloride flush  10-40 mL Intracatheter Q12H   sodium chloride flush  5 mL Intracatheter Q8H    Continuous Infusions:  sodium chloride 10 mL/hr at 10/28/19 1220   sodium chloride Stopped (10/20/19 2021)   fluconazole (DIFLUCAN) IV Stopped (10/27/19 1924)   heparin 1,100 Units/hr (10/28/19 1510)   piperacillin-tazobactam (ZOSYN)  IV Stopped (10/28/19 1511)   TPN ADULT (ION) 95  mL/hr at 10/28/19 1220   TPN ADULT (ION)      PRN Meds: sodium chloride, acetaminophen **OR** acetaminophen, hydrALAZINE, ipratropium-albuterol, lip balm, LORazepam, morphine injection, ondansetron (ZOFRAN) IV, silver nitrate applicators, sodium chloride flush  Physical Exam         Elderly male resting in bed Has NG tube with the suction Has some dependent edema Regular work of breathing S1-S2 Abdomen not distended  Vital Signs: BP (!) 122/52    Pulse  82    Temp (!) 97.3 F (36.3 C) (Oral)    Resp (!) 23    Ht _0  (1.803 m) Comment: per daughter   Wt 88.1 kg    SpO2 93%    BMI 27.09 kg/m  SpO2: SpO2: 93 % O2 Device: O2 Device: Room Air O2 Flow Rate: O2 Flow Rate (L/min): 2 L/min  Intake/output summary:   Intake/Output Summary (Last 24 hours) at 10/28/2019 1606 Last data filed at 10/28/2019 1220 Gross per 24 hour  Intake 2963.08 ml  Output 1950 ml  Net 1013.08 ml   LBM: Last BM Date: 10/26/19 Baseline Weight: Weight: 96.4 kg Most recent weight: Weight: 88.1 kg       Palliative Assessment/Data:     Palliative performance scale 30% Patient Active Problem List   Diagnosis Date Noted   Palliative care by specialist    Goals of care, counseling/discussion    General weakness    Acute abdominal pain    Bilateral atelectasis 10/17/2019   Acute respiratory insufficiency    Elevated troponin level    Acute respiratory failure with hypoxemia (HCC)    PVCs (premature ventricular contractions)    Obstipation 10/12/2019   HLD (hyperlipidemia) 10/12/2019   Diabetes mellitus type 2, uncontrolled, with complications ([place]) 57/32/2025   Perforated bowel ([place]) 10/12/2019   Perforated duodenal ulcer ([place]) 10/12/2019   Anemia 09/07/2016   Coronary artery disease due to lipid rich plaque 02/16/2015   Hyperlipidemia 02/16/2015   Malignant neoplasm of prostate ([place]) 02/18/2014   HTN (hypertension) 06/14/2013   Dyslipidemia 06/14/2013   GERD  (gastroesophageal reflux disease) 06/14/2013    Palliative Care Assessment & Plan   Patient Profile:    Assessment: 86 year old gentleman with coronary artery disease status post percutaneous intervention in the year 2000, hypertension, dyslipidemia, diabetes who was admitted with perforated duodenal ulcer and peritonitis. Cardiology has been following for NSVT, intermittent left bundle branch block and anterolateral T wave inversions as well as elevated troponin, at some point is supposed to undergo cardiac catheterization after acute issues resolved. General surgery also following.  Has NG tube.  To continue TPN.  Had upper GI today. Results pending.  Recommendations/Plan:  Full code/Full scope care  Daughter noted again family desire that Matthew Schwartz be evaluated for CIR.  We talked about plan for PT to continue to work with him/assess for appropriateness for CIR.    Continue current mode of care.   PMT will follow periodically, throughout the course of this hospitalization.    Goals of Care and Additional Recommendations:  Limitations on Scope of Treatment: Full Scope Treatment  Code Status:    Code Status Orders  (From admission, onward)         Start     Ordered   10/12/19 1208  Full code  Continuous        10/12/19 1212        Code Status History    Date Active Date Inactive Code Status Order ID Comments User Context   02/12/2014 1127 02/13/2014 0336 Full Code [id number]  Arne Cleveland, MD [place] Planning Activity    Advance Directive Documentation     Most Recent Value  Type of Advance Directive Healthcare Power of Attorney, Living will  Pre-existing out of facility DNR order (yellow form or pink MOST form) --  "MOST" Form in Place? --       Prognosis:   Unable to determine Guarded, continue to monitor hospital course and overall  disease trajectory.  Discharge Planning:  To Be Determined Recommend: Inpatient rehab or SNF rehab with  palliative  Care plan was discussed with daughter  Thank you for allowing the Palliative Medicine Team to assist in the care of this patient.   Time In: 1545 Time Out: 1600 Total Time 15 Prolonged Time Billed  no       Greater than 50%  of this time was spent counseling and coordinating care related to the above assessment and plan.  Micheline Rough, MD  Please contact Palliative Medicine Team phone at [phone] for questions and concerns.

## 2019-10-28 NOTE — Progress Notes (Signed)
ANTICOAGULATION CONSULT NOTE - follow up  Pharmacy Consult for Heparin Indication: chest pain/ACS, left upper extremity DVT  Allergies  Allergen Reactions  . Losartan Potassium Other (See Comments)    Unknown reaction that happened 15 years ago   Patient Measurements: Height: 5\' 11"  (180.3 cm) (per daughter) Weight: 88.1 kg (194 lb 3.6 oz) IBW/kg (Calculated) : 75.3 Heparin Dosing Weight: 94 kg  Vital Signs: Temp: 97.9 F (36.6 C) (06/30 0800) Temp Source: Oral (06/30 0800) BP: 131/60 (06/30 1200) Pulse Rate: 92 (06/30 1200)  Labs: Recent Labs    10/26/19 0320 10/26/19 0320 10/27/19 0508 10/27/19 1702 10/28/19 0230 10/28/19 0433 10/28/19 0759 10/28/19 1224  HGB 9.3*   < > 9.0*  --   --  8.9*  --   --   HCT 28.3*  --  28.6*  --   --  27.7*  --   --   PLT 419*  --  416*  --   --  403*  --   --   HEPARINUNFRC  --   --   --  0.27* 0.46  --   --  0.54  CREATININE 0.94  --  0.91  --   --   --  0.98  --    < > = values in this interval not displayed.   Estimated Creatinine Clearance: 59.8 mL/min (by C-G formula based on SCr of 0.98 mg/dL).  Medications:  Infusions:  . sodium chloride 10 mL/hr at 10/28/19 1220  . sodium chloride Stopped (10/20/19 2021)  . fluconazole (DIFLUCAN) IV Stopped (10/27/19 1924)  . heparin 1,200 Units/hr (10/28/19 1220)  . piperacillin-tazobactam (ZOSYN)  IV 12.5 mL/hr at 10/28/19 1220  . TPN ADULT (ION) 95 mL/hr at 10/28/19 1220  . TPN ADULT (ION)     Assessment: 86 yo male with perforated duodenal ulcer s/p omental patch repair.  Pharmacy consulted to dose IV heparin for NSTEMI as well as for acute deep vein thrombosis involving the left axillary vein, surrounding the PICC line. Findings consistent with acute superficial vein thrombosis involving the left cephalic vein. Patient with anemia of critical illenss/post-op s/p transfusion 6/27.   Today, 10/28/2019  Heparin level slightly above lower goal of 0.3-0.5 and rising compared to previous  level early this AM on current IV heparin rate of 1200 units/hr  Hgb low stable, platelets elevated stable  Transfused 6/27  No reported problems or bleeding  Goal of Therapy:  Heparin level 0.3-0.5 units/ml Monitor platelets by anticoagulation protocol: Yes   Plan:   Reduce IV heparin rate from 1200 units/hr to 1100 units/hr  Recheck heparin level 8 hours after rate change  Daily heparin level and CBC  Continue to monitor for signs/symptoms of bleeding   Adrian Saran, PharmD, BCPS Phone [phone] 10/28/2019 1:12 PM

## 2019-10-28 NOTE — Progress Notes (Signed)
Physical Therapy Treatment Patient Details Name: Matthew Schwartz MRN: [id number] DOB: 08/29/1932 Today's Date: 10/28/2019    History of Present Illness Pt s/p exploratory lap (10/12/19) to repair perforated duodenal ulcer and then on vent with extubation 10/14/19.  Pt with acute respiratory failure with hypoxia and acute systolic CHF in setting of sepsis and peritonitis.  Pt with hx of DM, prostate CA and bil TKR    PT Comments    Patient indicating that he does not feel up to sitting on bedside. Patient assisted with bed mobility and placed in Bed/chair position.  Indicates swirling finger that he feels dizzy, even when not rolling.Continue  Mobility as tolerated. Patient went for CT earlier.   Follow Up Recommendations  SNF     Equipment Recommendations    none.    Recommendations for Other Services       Precautions / Restrictions Precautions Precaution Comments: 1 right drain, abd, may have loose BM    Mobility  Bed Mobility   Bed Mobility: Rolling Rolling: Max assist;+2 for safety/equipment         General bed mobility comments: attempted to sit at bed edge. patient indicated"No" to sitting, placed in bed/chair position  Transfers                    Ambulation/Gait                 Stairs             Wheelchair Mobility    Modified Rankin (Stroke Patients Only)       Balance                                            Cognition Arousal/Alertness: Awake/alert Behavior During Therapy: Flat affect;WFL for tasks assessed/performed                                   General Comments: patient indicating that he is too tired today to sit up. Indicates feeling dizzy while lying in bed.      Exercises      General Comments        Pertinent Vitals/Pain Faces Pain Scale: Hurts little more Pain Location: holds abdomen when rolling Pain Descriptors / Indicators: Sore;Grimacing;Guarding Pain Intervention(s):  Monitored during session    Home Living                      Prior Function            PT Goals (current goals can now be found in the care plan section) Progress towards PT goals: Not progressing toward goals - comment (patient is much weaker)    Frequency    Min 2X/week      PT Plan Current plan remains appropriate    Co-evaluation              AM-PAC PT "6 Clicks" Mobility   Outcome Measure  Help needed turning from your back to your side while in a flat bed without using bedrails?: A Lot Help needed moving from lying on your back to sitting on the side of a flat bed without using bedrails?: A Lot Help needed moving to and from a bed to a chair (including a wheelchair)?: Total Help needed standing up  from a chair using your arms (e.g., wheelchair or bedside chair)?: Total Help needed to walk in hospital room?: Total Help needed climbing 3-5 steps with a railing? : Total 6 Click Score: 8    End of Session   Activity Tolerance: Treatment limited secondary to medical complications (Comment);Patient limited by fatigue Patient left: in bed;with call bell/phone within reach Nurse Communication: Mobility status;Need for lift equipment PT Visit Diagnosis: Difficulty in walking, not elsewhere classified (R26.2);Muscle weakness (generalized) (M62.81)     Time: 3374-4514 PT Time Calculation (min) (ACUTE ONLY): 29 min  Charges:  $Therapeutic Activity: 23-37 mins                     Tresa Endo PT Acute Rehabilitation Services Pager [phone] Office [phone]    Claretha Cooper 10/28/2019, 3:12 PM

## 2019-10-28 NOTE — Progress Notes (Signed)
PROGRESS NOTE   Matthew Schwartz  HYQ:[id number] DOB: 11-Sep-1932 DOA: 10/12/2019 PCP: [name], MD  Brief Narrative:  86 year old [place] male NIDDM, HLD, GERD, irritable bowel syndrome (constipation) status post left knee replacement 2009, PTCA 2000-->RCA prostate CA, former smoker Admit 10/12/2019 severe abdominal pain-ex lap 10/12/2019-perforated duodenal ulcer and remained on vent NSVT postop on pressors and cardiology consulted-eventually palliative care consulted-extubated 617    Assessment & Plan:   Active Problems:   HTN (hypertension)   Malignant neoplasm of prostate (HCC)   Hyperlipidemia   Obstipation   HLD (hyperlipidemia)   Diabetes mellitus type 2, uncontrolled, with complications (HCC)   Perforated bowel ([place])   Perforated duodenal ulcer ([place])   Acute respiratory insufficiency   Elevated troponin level   Acute respiratory failure with hypoxemia (HCC)   PVCs (premature ventricular contractions)   Bilateral atelectasis   Acute abdominal pain   Palliative care by specialist   Goals of care, counseling/discussion   General weakness   1. Septic shock + peritonitis status post omental patch repair 6/14 a. Further planning after upper GI series per general surgery b. And fluid collections noted 6/23-drain placement 6/24 yielded moderate Candida albicans and abundant WBC c. Sounds like may be pulling NG tube and starting liquids? d. Defer antibiotic duration to general surgery as he is n.p.o. 2. Hypoxic respiratory failure extubated 6/17-NG tube in place with current atelectasis a. Repeat x-ray periodically b. Seems euvolemic at this time would hold any diuretic c. Monitor trends 3. Acute systolic heart failure + NSTEMI, CAD 2000 a. Remains on IV heparin-once taking p.o. can transition to oral meds b. Remains on beta-blocker and clonidine patch c. Home calcium channel blocker ACE nitrates are on hold because of n.p.o. state 4. Left upper extremity DVT  cephalic/axillary vein a. Remains on heparin b. Ideally would start DOAC once reliably taking n.p.o. 5. DM TY 2 A1c 6.6 a. Not a diabetic prior to admission and probably this is from his TPN b. Sliding scale coverage as needed however would not aggressively control 6. Anemia critical illness + transfusion 6/27 2 units PRBC a. Hemoglobin is stabilized in the 8-9 range after 2 units b. Repeat labs in a.m. 7. Malignancy of prostate a. Outpatient screening follow-up and PSA 8. Former smoker   DVT prophylaxis:  Code Status: full Family Communication: Discussed with daughter bedside  Disposition:   Status is: Inpatient  Remains inpatient appropriate because:Ongoing diagnostic testing needed not appropriate for outpatient work up   Dispo:  Patient From: Home  Planned Disposition: [place]  Expected discharge date: 10/31/19  Medically stable for discharge: No    Consultants:   General surgery  Cardiology  Procedures: None  Antimicrobials: IV Zosyn since since 6/14 IV Diflucan since 6/26   Subjective: Quite weak Nursing reports confusion overnight which seems to have resolved Given Ativan this morning Conversant and 73 Seems to prefer to let his daughter talk for him  Objective: Vitals:   10/28/19 0300 10/28/19 0400 10/28/19 0500 10/28/19 0600  BP: (!) 142/65 (!) 144/67 (!) 147/66 (!) 156/68  Pulse: 78  77 84  Resp: (!) 22 17 (!) 22 20  Temp:  97.9 F (36.6 C)    TempSrc:  Oral    SpO2: 93% 96% 93% 95%  Weight:   88.1 kg   Height:        Intake/Output Summary (Last 24 hours) at 10/28/2019 0653 Last data filed at 10/28/2019 9528 Gross per 24 hour  Intake 3599.2 ml  Output 3035 ml  Net 564.2 ml   Filed Weights   10/23/19 0500 10/26/19 0500 10/28/19 0500  Weight: 92.1 kg 87.9 kg 88.1 kg    Examination:  General exam: Quite weak appearing frail Asian gentleman Respiratory system: Clinically clear no rales no rhonchi Cardiovascular  system: S1-S2 no murmur Gastrointestinal system: Large bandage not disturbed please see pictures from surgeon earlier today. Central nervous system: Intact no focal deficit Extremities: No lower extremity edema although right foot is a little colder than the left Skin: As above Psychiatry: Quiet affect  Data Reviewed: I have personally reviewed following labs and imaging studies Hemoglobin 8.9 WBC 8.7 BUN/creatinine 30/0.9 bicarb 21 LFTs within normal limits  Radiology Studies: DG Chest Port 1 View  Result Date: 10/26/2019 CLINICAL DATA:  Acute respiratory failure with hypoxia, history diabetes mellitus, prostate cancer, hypertension EXAM: PORTABLE CHEST 1 VIEW COMPARISON:  Portable exam 1121 hours compared to 10/20/2019 FINDINGS: Nasogastric tube extends into stomach. LEFT arm PICC line tip projecting over high RIGHT atrium. Upper normal heart size. Mediastinal contours and pulmonary vascularity normal. Low lung volumes with bibasilar atelectasis. No acute infiltrate, pleural effusion or pneumothorax. IMPRESSION: Decreased lung volumes with bibasilar atelectasis. Electronically Signed   By: Lavonia Dana M.D.   On: 10/26/2019 11:38     Scheduled Meds: . Chlorhexidine Gluconate Cloth  6 each Topical Daily  . cloNIDine  0.2 mg Transdermal Weekly  . insulin aspart  0-20 Units Subcutaneous Q4H  . insulin detemir  10 Units Subcutaneous BID  . ipratropium-albuterol  3 mL Nebulization BID  . lidocaine  1 patch Transdermal Q24H  . mouth rinse  15 mL Mouth Rinse BID  . metoprolol tartrate  5 mg Intravenous Q4H  . nitroGLYCERIN  1 inch Topical Q6H  . pantoprazole (PROTONIX) IV  40 mg Intravenous Q12H  . sodium chloride flush  10-40 mL Intracatheter Q12H  . sodium chloride flush  5 mL Intracatheter Q8H   Continuous Infusions: . sodium chloride 10 mL/hr at 10/28/19 0628  . sodium chloride Stopped (10/20/19 2021)  . fluconazole (DIFLUCAN) IV Stopped (10/27/19 1924)  . heparin 1,200 Units/hr  (10/28/19 8502)  . piperacillin-tazobactam (ZOSYN)  IV 12.5 mL/hr at 10/28/19 7741  . TPN ADULT (ION) 95 mL/hr at 10/28/19 0628     LOS: 16 days    Time spent: [place], MD [facility] To contact the attending provider between 7A-7P or the covering provider during after hours 7P-7A, please log into the web site [email or web address] and access using universal [place] password for that web site. If you do not have the password, please call the hospital operator.  10/28/2019, 6:53 AM

## 2019-10-28 NOTE — Progress Notes (Signed)
16 Days Post-Op    CC: Abdominal pain  Subjective: Frail elderly gentleman in no acute distress.  NG in place with minimal drainage.  Planning UGI today.  Midline incision is healing nicely and see picture below.  IR drain shows clear serous fluid.  He says is not been out of bed.  His daughter reports some BM's.  Objective: Vital signs in last 24 hours: Temp:  [97.9 F (36.6 C)-99.1 F (37.3 C)] 97.9 F (36.6 C) (06/30 0400) Pulse Rate:  [73-91] 91 (06/30 0800) Resp:  [12-25] 25 (06/30 0800) BP: (115-167)/(48-76) 149/67 (06/30 0800) SpO2:  [88 %-100 %] 94 % (06/30 0800) Weight:  [88.1 kg] 88.1 kg (06/30 0500) Last BM Date: 10/26/19 420 p.o. 3100 IV 2500 urine NG 525 JP drain 10 Afebrile, vital signs stable WBC 8.7, H/H 8.9/27.7 Platelets 403K BMP pending Intake/Output from previous day: 06/29 0701 - 06/30 0700 In: 3599.2 [P.O.:420; I.V.:2795.8; IV Piggyback:373.4] Out: 3035 [Urine:2500; Emesis/NG output:525; Drains:10] Intake/Output this shift: No intake/output data recorded.  General appearance: alert, cooperative, no distress and frail, elderly male, With ongoing NG in place Resp: clear to auscultation bilaterally; anterior exam Cardio: regular rate and rhythm this AM GI: soft sore, site is healing well, few BS, drain is clear serous fluid   Lab Results:  Recent Labs    10/27/19 0508 10/28/19 0433  WBC 9.0 8.7  HGB 9.0* 8.9*  HCT 28.6* 27.7*  PLT 416* 403*    BMET Recent Labs    10/26/19 0320 10/27/19 0508  NA 141 139  K 4.3 4.3  CL 110 108  CO2 24 21*  GLUCOSE 107* 128*  BUN 31* 30*  CREATININE 0.94 0.91  CALCIUM 8.1* 7.7*   PT/INR No results for input(s): LABPROT, INR in the last 72 hours.  Recent Labs  Lab 10/23/19 0500 10/24/19 0414 10/25/19 0803 10/26/19 0320 10/27/19 0508  AST 31 33 29 29 27   ALT 42 46* 40 37 35  ALKPHOS 61 62 55 56 62  BILITOT 0.8 0.8 0.9 1.0 0.7  PROT 5.6* 6.1* 6.0* 6.6 6.3*  ALBUMIN 1.6* 1.8* 1.7* 1.8* 1.7*      Lipase     Component Value Date/Time   LIPASE 44 10/12/2019 0320     Medications:  Chlorhexidine Gluconate Cloth  6 each Topical Daily   cloNIDine  0.2 mg Transdermal Weekly   insulin aspart  0-20 Units Subcutaneous Q4H   insulin detemir  10 Units Subcutaneous BID   ipratropium-albuterol  3 mL Nebulization BID   lidocaine  1 patch Transdermal Q24H   mouth rinse  15 mL Mouth Rinse BID   metoprolol tartrate  5 mg Intravenous Q4H   nitroGLYCERIN  1 inch Topical Q6H   pantoprazole (PROTONIX) IV  40 mg Intravenous Q12H   sodium chloride flush  10-40 mL Intracatheter Q12H   sodium chloride flush  5 mL Intracatheter Q8H    sodium chloride 10 mL/hr at 10/28/19 0628   sodium chloride Stopped (10/20/19 2021)   fluconazole (DIFLUCAN) IV Stopped (10/27/19 1924)   heparin 1,200 Units/hr (10/28/19 0628)   piperacillin-tazobactam (ZOSYN)  IV 12.5 mL/hr at 10/28/19 0628   TPN ADULT (ION) 95 mL/hr at 10/28/19 9371   Anti-infectives (From admission, onward)   Start     Dose/Rate Route Frequency Ordered Stop   10/25/19 1800  fluconazole (DIFLUCAN) IVPB 400 mg     Discontinue     400 mg 100 mL/hr over 120 Minutes Intravenous Every 24 hours 10/24/19 1750  10/24/19 1830  fluconazole (DIFLUCAN) IVPB 800 mg  Status:  Discontinued        800 mg 200 mL/hr over 120 Minutes Intravenous  Once 10/24/19 1750 10/24/19 1755   10/24/19 1830  fluconazole (DIFLUCAN) IVPB 800 mg        800 mg 100 mL/hr over 240 Minutes Intravenous Every 24 hours 10/24/19 1756 10/24/19 2240   10/12/19 2000  piperacillin-tazobactam (ZOSYN) IVPB 3.375 g     Discontinue     3.375 g 12.5 mL/hr over 240 Minutes Intravenous Every 8 hours 10/12/19 1911     10/12/19 1708  ceFAZolin (ANCEF) 2-4 GM/100ML-% IVPB       Note to Pharmacy: Bennye Alm   : cabinet override      10/12/19 1708 10/12/19 1849      Assessment/Plan NSTEMI/NSVT -Heparin drip/nitro ointment/beta-blocker Acute systolic HF - EF  69-79%/YIAXK 1 diastolic function DVT - LUE 10/17/19 Remote hx of prostate cancer s/p radiation HTN - off pressors HLD GERD - Protonix DM2 VDRF - extubated 6/17 PCCM sign off 6/25 VT  - on amio drip/DCed 6/20 Hyperglycemia - SSI Hypokalemia  ABL anemia -Hgb 12.4 > 10.4.Hold chemical prophylaxis until hgb stabilized  Severe Protein calorie malnutrition - Pre-alb < 5. TPN Anemia   Perforated Duodenal Ulcer S/p Exploratory laparotomy, omental patch repair of duodenal ulcer - Dr. [name] - 10/12/2019 - POD #16 - Maintain JP drain. Bile in drain.Likely residual from contamination from perforation -TPN - Continue NGT. UGI with small leak 10/19/19>> repeat UGI 6/30 pending  -IR drain 6/20 -RUQ fluid collection  FEN -NPO, NGT, IVF, TPN VTE - SCDs, heparin drip ID -Zosyn 6/14 >> day 17; Diflucan 6/26>> day 5 Foley - in place Follow-Up - Dr. [name]  Plan:  UGI today.      LOS: 16 days    Matthew Schwartz 10/28/2019 Please see Amion

## 2019-10-28 NOTE — Progress Notes (Signed)
Nutrition Follow-up  DOCUMENTATION CODES:   Not applicable  INTERVENTION:  - continue TPN per Pharmacist. - diet advancement as medically feasible.  NUTRITION DIAGNOSIS:   Inadequate oral intake related to inability to eat as evidenced by NPO status. -ongoing  GOAL:   Patient will meet greater than or equal to 90% of their needs -met with TPN regimen  MONITOR:   Diet advancement, Labs, Weight trends, Other (Comment) (TPN regimen)  ASSESSMENT:   Pt admitted with obstipation. PMH includes prostate cancer s/p radiation, HTN, HLD, GERD, T2DM.  Significant Events: 6/14- admission; intubation; NGT placement (gastric); ex lap with omental patch repair of perforated duodenal ulcer 6/15- initial RD assessment; triple lumen PICC placed in L basilic; TPN initiation 0/47- extubation 6/21- UGI which showed a small leak from repair   Patient remains NPO with NGT in place. He was being prepared to be taken for leak study (UGI); RD visit was brief. NGT to LIS with flow sheet documentation indicating 275 ml output during night shift and 175 ml output during this shift. RN at bedside and states plan is likely for NGT removal today.  Flow sheet documentation indicates mild edema to BUE. Weight was trending down from admission until 6/28 and has now been stable since 6/28.   He is receiving custom TPN at goal rate of 95 ml/hr which is providing 2262 kcal and 121 grams protein.     Labs reviewed; CBGs: 128 and 118 mg/dl, BUN: 28 mg/dl, Ca: 8 mg/dl. Medications reviewed; sliding scale novolog, 40 mg IV protonix BID.   Diet Order:   Diet Order            Diet NPO time specified  Diet effective now                 EDUCATION NEEDS:   Not appropriate for education at this time  Skin:  Skin Assessment: Skin Integrity Issues: Skin Integrity Issues:: Incisions Incisions: abdomen (6/14)  Last BM:  6/28  Height:   Ht Readings from Last 1 Encounters:  10/13/19 _0  (1.803 m)     Weight:   Wt Readings from Last 1 Encounters:  10/28/19 88.1 kg    Estimated Nutritional Needs:  Kcal:  2200-2400 Protein:  115-125 grams Fluid:  >2L/d     Jarome Matin, MS, RD, LDN, CNSC Inpatient Clinical Dietitian RD pager # available in [place]  After hours/weekend pager # available in [facility]

## 2019-10-28 NOTE — Progress Notes (Signed)
ANTICOAGULATION CONSULT NOTE - Initial Consult  Pharmacy Consult for heparin Indication:  chest pain/ACS, left upper extremity DVT  Allergies  Allergen Reactions  . Losartan Potassium Other (See Comments)    Unknown reaction that happened 15 years ago    Patient Measurements: Height: 5\' 11"  (180.3 cm) (per daughter) Weight: 88.1 kg (194 lb 3.6 oz) IBW/kg (Calculated) : 75.3 Heparin Dosing Weight:   Vital Signs: Temp: 97.9 F (36.6 C) (06/30 0400) Temp Source: Oral (06/30 0400) BP: 156/68 (06/30 0600) Pulse Rate: 84 (06/30 0600)  Labs: Recent Labs    10/25/19 0803 10/25/19 1845 10/26/19 0320 10/26/19 0320 10/27/19 0508 10/27/19 1702 10/28/19 0230 10/28/19 0433  HGB  --    < > 9.3*   < > 9.0*  --   --  8.9*  HCT  --    < > 28.3*  --  28.6*  --   --  27.7*  PLT  --    < > 419*  --  416*  --   --  403*  HEPARINUNFRC  --   --   --   --   --  0.27* 0.46  --   CREATININE 1.02  --  0.94  --  0.91  --   --   --    < > = values in this interval not displayed.    Estimated Creatinine Clearance: 64.4 mL/min (by C-G formula based on SCr of 0.91 mg/dL).   Medical History: Past Medical History:  Diagnosis Date  . Diabetes mellitus without complication ([place])   . Dyslipidemia   . GERD (gastroesophageal reflux disease)   . Hypertension   . Prostate cancer ([place]) 12/23/13   Adenocarcinoma  . PSA elevation   . S/P radiation therapy 05/18/2014 through 07/16/2014    Prostate 7800 cGy in 40 sessions, seminal vesicles 5600 cGy in 40 sessions     Medications:  Infusions:  . sodium chloride 10 mL/hr at 10/28/19 0628  . sodium chloride Stopped (10/20/19 2021)  . fluconazole (DIFLUCAN) IV Stopped (10/27/19 1924)  . heparin 1,200 Units/hr (10/28/19 8675)  . piperacillin-tazobactam (ZOSYN)  IV 12.5 mL/hr at 10/28/19 4492  . TPN ADULT (ION) 95 mL/hr at 10/28/19 0100    Assessment: Patient with heparin at  goal even with reduced goal.  No heparin issues noted.  Goal of Therapy:  Heparin level 0.3-0.5 units/ml Monitor platelets by anticoagulation protocol: Yes   Plan:  Continue heparin drip at current rate Recheck level at [place] 10/28/2019,6:44 AM

## 2019-10-29 ENCOUNTER — Inpatient Hospital Stay (HOSPITAL_COMMUNITY): Payer: Medicare Other

## 2019-10-29 DIAGNOSIS — R509 Fever, unspecified: Secondary | ICD-10-CM

## 2019-10-29 DIAGNOSIS — R188 Other ascites: Secondary | ICD-10-CM

## 2019-10-29 DIAGNOSIS — R0682 Tachypnea, not elsewhere classified: Secondary | ICD-10-CM

## 2019-10-29 DIAGNOSIS — C61 Malignant neoplasm of prostate: Secondary | ICD-10-CM

## 2019-10-29 LAB — BLOOD GAS, ARTERIAL
Acid-Base Excess: 2.1 mmol/L — ABNORMAL HIGH (ref 0.0–2.0)
Bicarbonate: 24.8 mmol/L (ref 20.0–28.0)
O2 Saturation: 99.6 %
Patient temperature: 100.2
pCO2 arterial: 33.9 mmHg (ref 32.0–48.0)
pH, Arterial: 7.481 — ABNORMAL HIGH (ref 7.350–7.450)
pO2, Arterial: 156 mmHg — ABNORMAL HIGH (ref 83.0–108.0)

## 2019-10-29 LAB — CBC WITH DIFFERENTIAL/PLATELET
Abs Immature Granulocytes: 0.24 10*3/uL — ABNORMAL HIGH (ref 0.00–0.07)
Abs Immature Granulocytes: 0.17 10*3/uL — ABNORMAL HIGH (ref 0.00–0.07)
Basophils Absolute: 0 10*3/uL (ref 0.0–0.1)
Basophils Absolute: 0 10*3/uL (ref 0.0–0.1)
Basophils Relative: 0 %
Basophils Relative: 0 %
Eosinophils Absolute: 0.4 10*3/uL (ref 0.0–0.5)
Eosinophils Absolute: 0.3 10*3/uL (ref 0.0–0.5)
Eosinophils Relative: 4 %
Eosinophils Relative: 3 %
HCT: 28.1 % — ABNORMAL LOW (ref 39.0–52.0)
HCT: 28.2 % — ABNORMAL LOW (ref 39.0–52.0)
Hemoglobin: 8.9 g/dL — ABNORMAL LOW (ref 13.0–17.0)
Hemoglobin: 8.9 g/dL — ABNORMAL LOW (ref 13.0–17.0)
Immature Granulocytes: 3 %
Immature Granulocytes: 2 %
Lymphocytes Relative: 13 %
Lymphocytes Relative: 11 %
Lymphs Abs: 1.2 10*3/uL (ref 0.7–4.0)
Lymphs Abs: 1.1 10*3/uL (ref 0.7–4.0)
MCH: 27.1 pg (ref 26.0–34.0)
MCH: 27 pg (ref 26.0–34.0)
MCHC: 31.7 g/dL (ref 30.0–36.0)
MCHC: 31.6 g/dL (ref 30.0–36.0)
MCV: 85.4 fL (ref 80.0–100.0)
MCV: 85.5 fL (ref 80.0–100.0)
Monocytes Absolute: 1.1 10*3/uL — ABNORMAL HIGH (ref 0.1–1.0)
Monocytes Absolute: 1 10*3/uL (ref 0.1–1.0)
Monocytes Relative: 12 %
Monocytes Relative: 10 %
Neutro Abs: 6.4 10*3/uL (ref 1.7–7.7)
Neutro Abs: 7.4 10*3/uL (ref 1.7–7.7)
Neutrophils Relative %: 68 %
Neutrophils Relative %: 74 %
Platelets: 407 10*3/uL — ABNORMAL HIGH (ref 150–400)
Platelets: 396 10*3/uL (ref 150–400)
RBC: 3.29 MIL/uL — ABNORMAL LOW (ref 4.22–5.81)
RBC: 3.3 MIL/uL — ABNORMAL LOW (ref 4.22–5.81)
RDW: 17.2 % — ABNORMAL HIGH (ref 11.5–15.5)
RDW: 17.3 % — ABNORMAL HIGH (ref 11.5–15.5)
WBC: 9.3 10*3/uL (ref 4.0–10.5)
WBC: 10 10*3/uL (ref 4.0–10.5)
nRBC: 0 % (ref 0.0–0.2)
nRBC: 0 % (ref 0.0–0.2)

## 2019-10-29 LAB — PHOSPHORUS
Phosphorus: 2.8 mg/dL (ref 2.5–4.6)
Phosphorus: 2.8 mg/dL (ref 2.5–4.6)

## 2019-10-29 LAB — COMPREHENSIVE METABOLIC PANEL
ALT: 27 U/L (ref 0–44)
AST: 23 U/L (ref 15–41)
Albumin: 1.7 g/dL — ABNORMAL LOW (ref 3.5–5.0)
Alkaline Phosphatase: 61 U/L (ref 38–126)
Anion gap: 10 (ref 5–15)
BUN: 26 mg/dL — ABNORMAL HIGH (ref 8–23)
CO2: 24 mmol/L (ref 22–32)
Calcium: 8.1 mg/dL — ABNORMAL LOW (ref 8.9–10.3)
Chloride: 105 mmol/L (ref 98–111)
Creatinine, Ser: 0.9 mg/dL (ref 0.61–1.24)
GFR calc Af Amer: >60 mL/min (ref >60)
GFR calc non Af Amer: >60 mL/min (ref >60)
Glucose, Bld: 126 mg/dL — ABNORMAL HIGH (ref 70–99)
Potassium: 4.6 mmol/L (ref 3.5–5.1)
Sodium: 139 mmol/L (ref 135–145)
Total Bilirubin: 0.7 mg/dL (ref 0.3–1.2)
Total Protein: 6.8 g/dL (ref 6.5–8.1)

## 2019-10-29 LAB — GLUCOSE, CAPILLARY
Glucose-Capillary: 126 mg/dL — ABNORMAL HIGH (ref 70–99)
Glucose-Capillary: 111 mg/dL — ABNORMAL HIGH (ref 70–99)
Glucose-Capillary: 156 mg/dL — ABNORMAL HIGH (ref 70–99)
Glucose-Capillary: 135 mg/dL — ABNORMAL HIGH (ref 70–99)

## 2019-10-29 LAB — BASIC METABOLIC PANEL
Anion gap: 4 — ABNORMAL LOW (ref 5–15)
BUN: 27 mg/dL — ABNORMAL HIGH (ref 8–23)
CO2: 27 mmol/L (ref 22–32)
Calcium: 7.7 mg/dL — ABNORMAL LOW (ref 8.9–10.3)
Chloride: 107 mmol/L (ref 98–111)
Creatinine, Ser: 0.88 mg/dL (ref 0.61–1.24)
GFR calc Af Amer: >60 mL/min (ref >60)
GFR calc non Af Amer: >60 mL/min (ref >60)
Glucose, Bld: 148 mg/dL — ABNORMAL HIGH (ref 70–99)
Potassium: 4.2 mmol/L (ref 3.5–5.1)
Sodium: 138 mmol/L (ref 135–145)

## 2019-10-29 LAB — LACTIC ACID, PLASMA
Lactic Acid, Venous: 0.8 mmol/L (ref 0.5–1.9)
Lactic Acid, Venous: 0.8 mmol/L (ref 0.5–1.9)

## 2019-10-29 LAB — HEPARIN LEVEL (UNFRACTIONATED): Heparin Unfractionated: 0.36 IU/mL (ref 0.30–0.70)

## 2019-10-29 LAB — MAGNESIUM
Magnesium: 2.2 mg/dL (ref 1.7–2.4)
Magnesium: 2.2 mg/dL (ref 1.7–2.4)

## 2019-10-29 MED ORDER — TRAVASOL 10 % IV SOLN
INTRAVENOUS | Status: AC
  Filled 2019-10-29: qty 1208.4

## 2019-10-29 MED ORDER — SODIUM CHLORIDE (PF) 0.9 % IJ SOLN
INTRAMUSCULAR | Status: AC
  Filled 2019-10-29: qty 50

## 2019-10-29 MED ORDER — SODIUM CHLORIDE 0.9 % IV SOLN: INTRAVENOUS | Status: DC

## 2019-10-29 MED ORDER — ACETAMINOPHEN 10 MG/ML IV SOLN
1000.0000 mg | Freq: Four times a day (QID) | INTRAVENOUS | Status: AC
  Administered 2019-10-30 (×4): 1000 mg via INTRAVENOUS
  Filled 2019-10-29 (×4): qty 100

## 2019-10-29 MED ORDER — SODIUM CHLORIDE 0.9 % IV SOLN
1.0000 g | Freq: Three times a day (TID) | INTRAVENOUS | Status: AC
  Administered 2019-10-29 – 2019-11-05 (×22): 1 g via INTRAVENOUS
  Filled 2019-10-29 (×23): qty 1

## 2019-10-29 MED ORDER — VANCOMYCIN HCL IN DEXTROSE 1-5 GM/200ML-% IV SOLN
1000.0000 mg | Freq: Two times a day (BID) | INTRAVENOUS | Status: DC
  Administered 2019-10-29 – 2019-10-31 (×4): 1000 mg via INTRAVENOUS
  Filled 2019-10-29 (×4): qty 200

## 2019-10-29 MED ORDER — IOHEXOL 300 MG/ML  SOLN
100.0000 mL | Freq: Once | INTRAMUSCULAR | Status: AC | PRN
  Administered 2019-10-29: 100 mL via INTRAVENOUS

## 2019-10-29 NOTE — Progress Notes (Signed)
PROGRESS NOTE   Tailor Westfall  OHY:[id number] DOB: [birth year] DOA: 10/12/2019 PCP: [name], MD  Brief Narrative:  86 year old indiaan male NIDDM, HLD, GERD, irritable bowel syndrome (constipation) status post left knee replacement 2009, PTCA 2000-->RCA prostate CA, former smoker Admit 10/12/2019 severe abdominal pain-ex lap 10/12/2019-perforated duodenal ulcer and remained on vent NSVT postop on pressors and cardiology consulted-eventually palliative care consulted-extubated 617 He has stabilized but he remains on TPN On 6/30 upper GI series were performed-he remains n.p.o. for now   Assessment & Plan:   Active Problems:   HTN (hypertension)   Malignant neoplasm of prostate (HCC)   Hyperlipidemia   Obstipation   HLD (hyperlipidemia)   Diabetes mellitus type 2, uncontrolled, with complications (HCC)   Perforated bowel ([place])   Perforated duodenal ulcer ([place])   Acute respiratory insufficiency   Elevated troponin level   Acute respiratory failure with hypoxemia (HCC)   PVCs (premature ventricular contractions)   Bilateral atelectasis   Acute abdominal pain   Palliative care by specialist   Goals of care, counseling/discussion   General weakness   1. Septic shock + peritonitis status post omental patch repair 6/14 a. UGI series 6/30 shows persisting small leak per general surgery b. Fluid collections noted 6/23-drain placement 6/24 yielded moderate Candida albicans and abundant WBC c. Defer antibiotic duration to general surgery as he is n.p.o. 2. Hypoxic respiratory failure extubated 6/17-NG tube in place with current atelectasis a. Mild temperature of 100.47/1 monitor trends b. Seems euvolemic at this time would hold any diuretic c. Monitor trends 3. Acute systolic heart failure + NSTEMI, CAD 2000 a. Remains on IV heparin-once taking p.o. can transition to oral meds b. Remains on beta-blocker and clonidine patch c. Home calcium channel blocker ACE nitrates are on hold  because of n.p.o. state 4. Left upper extremity DVT cephalic/axillary vein a. Remains on heparin b. Ideally would start DOAC once reliably taking n.p.o. 5. DM TY 2 A1c 6.6 a. Not a diabetic prior to admission and probably this is from his TPN b. Sliding scale coverage as needed however would not aggressively control 6. Anemia critical illness + transfusion 6/27 2 units PRBC a. Hemoglobin is stabilized in the 8-9 range after 2 units b. Repeat labs in a.m. 7. Malignancy of prostate a. Outpatient screening follow-up and PSA 8. Former smoker   DVT prophylaxis:  Code Status: full Family Communication: Discussed with daughter bedside  Disposition:   Status is: Inpatient will be transferring to progressive floor from stepdown as he has been stable for a relatively long period of time  Remains inpatient appropriate because:Ongoing diagnostic testing needed not appropriate for outpatient work up   [place]:  Patient From: Home  Planned Disposition: [place]  Expected discharge date: 10/31/19  Medically stable for discharge: No    Consultants:   General surgery  Cardiology  Procedures: None  Antimicrobials: IV Zosyn since since 6/14 IV Diflucan since 6/26   Subjective: Still feels relatively weak NG tube still in place He has no other specific complaints He has not been out of bed  Objective: Vitals:   10/29/19 1128 10/29/19 1200 10/29/19 1300 10/29/19 1400  BP:  (!) 174/93 (!) 157/64 (!) 128/93  Pulse: 91 92 86 98  Resp: (!) 24 (!) 22 (!) 25 (!) 28  Temp: 97.7 F (36.5 C) 98.1 F (36.7 C)    TempSrc: Oral Oral    SpO2: 94% 95% 97% 97%  Weight:      Height:  Intake/Output Summary (Last 24 hours) at 10/29/2019 1501 Last data filed at 10/29/2019 1400 Gross per 24 hour  Intake 3298.23 ml  Output 2205 ml  Net 1093.23 ml   Filed Weights   10/23/19 0500 10/26/19 0500 10/28/19 0500  Weight: 92.1 kg 87.9 kg 88.1 kg    Examination:  General  exam: Quite weak appearing frail Asian gentleman-no distress however Respiratory system: Clinically clear no rales no rhonchi Cardiovascular system: S1-S2 no murmur Gastrointestinal system: L large bandage over abdomen Central nervous system: Intact no focal deficit Extremities: Well perfused lower extremities Skin: As above Psychiatry: Quiet affect  Data Reviewed: I have personally reviewed following labs and imaging studies Hemoglobin 8.9 WBC 8.7 BUN/creatinine 30/0.9 -->26/0.9 bicarb 21 LFTs within normal limits  Radiology Studies: CT ABDOMEN PELVIS W CONTRAST  Result Date: 10/29/2019 CLINICAL DATA:  Possible abscess. EXAM: CT ABDOMEN AND PELVIS WITH CONTRAST TECHNIQUE: Multidetector CT imaging of the abdomen and pelvis was performed using the standard protocol following bolus administration of intravenous contrast. CONTRAST:  131mL OMNIPAQUE IOHEXOL 300 MG/ML  SOLN COMPARISON:  October 21, 2019. FINDINGS: Lower chest: Mild bilateral posterior basilar subsegmental atelectasis is noted. Hepatobiliary: No cholelithiasis is noted. Mild gallbladder distention is noted. Stable dilatation of common bile duct is noted. No focal abnormality is noted in the liver. Pancreas: Unremarkable. No pancreatic ductal dilatation or surrounding inflammatory changes. Spleen: Normal in size without focal abnormality. Adrenals/Urinary Tract: Adrenal glands appear normal. Stable bilateral renal cysts are noted. No hydronephrosis or renal obstruction is noted. No renal or ureteral calculi are noted. Urinary bladder is unremarkable. Stomach/Bowel: Nasogastric tube tip is seen in proximal stomach. There is no evidence of bowel obstruction or inflammation. The appendix appears normal. Vascular/Lymphatic: Aortic atherosclerosis. No enlarged abdominal or pelvic lymph nodes. Reproductive: Stable postsurgical changes are noted in the prostate gland. Other: Anterior midline surgical incision is again noted. Large irregular high  density is noted in this area most consistent with enlarging subcutaneous or intramuscular hematoma. There is been interval placement of pigtail drainage catheter in the area of fluid collection noted on prior exam. Fluid collection is significantly decompressed compared to prior exam. Musculoskeletal: No acute or significant osseous findings. IMPRESSION: 1. Anterior midline surgical incision is again noted. Large irregular high density is noted in this area most consistent with enlarging subcutaneous or intramuscular hematoma. 2. There has been interval placement of pigtail drainage catheter in the area of fluid collection noted on prior exam in the right upper quadrant. Fluid collection is significantly decompressed compared to prior exam. 3. Stable dilatation of common bile duct is noted. 4. Stable bilateral renal cysts are noted. Aortic Atherosclerosis (ICD10-I70.0). Electronically Signed   By: Marijo Conception M.D.   On: 10/29/2019 12:44   DG UGI W SINGLE CM (SOL OR THIN BA)  Result Date: 10/28/2019 CLINICAL DATA:  Post omental patch repair of duodenal ulcer 10/12/2019. Proximal duodenal upper GI which leak on 10/21/2019 EXAM: UPPER GI SERIES WITH KUB TECHNIQUE: After obtaining a scout radiograph a routine upper GI series was performed using water-soluble contrast FLUOROSCOPY TIME:  Fluoroscopy Time:  2.9 Radiation Exposure Index (if provided by the fluoroscopic device): 158.9 mGy Number of Acquired Spot Images: 13 COMPARISON:  Upper GI 10/21/2019 FINDINGS: Initial KUB demonstrates pigtail catheter in the RIGHT upper quadrant. Water-soluble contrast (200 mL) was hand injected in stomach through the NG tube. Contrast readily flowed through the distal stomach into the duodenum. A small stump of a leak is noted at the level of  the first portion the duodenum. This is the site of the more prominent elongated leak demonstrated on prior GI 10/21/2019. The small extraluminal collection measures 4 mm x 2 mm.  Previously measured 26 mm by 1 mm. No communication with the pigtail catheter in the RIGHT upper quadrant in the same vicinity. 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 Findings suggest resolving but persistent extraluminal collection. Findings conveyed toWill [place], [place] on 10/28/2019  at12:30. Electronically Signed   By: Suzy Bouchard M.D.   On: 10/28/2019 12:30     Scheduled Meds:  Chlorhexidine Gluconate Cloth  6 each Topical Daily   cloNIDine  0.2 mg Transdermal Weekly   insulin aspart  0-20 Units Subcutaneous Q6H   insulin detemir  10 Units Subcutaneous BID   lidocaine  1 patch Transdermal Q24H   mouth rinse  15 mL Mouth Rinse BID   metoprolol tartrate  5 mg Intravenous Q4H   nitroGLYCERIN  1 inch Topical Q6H   pantoprazole (PROTONIX) IV  40 mg Intravenous Q12H   sodium chloride flush  10-40 mL Intracatheter Q12H   sodium chloride flush  5 mL Intracatheter Q8H   Continuous Infusions:  sodium chloride Stopped (10/29/19 0052)   sodium chloride Stopped (10/20/19 2021)   fluconazole (DIFLUCAN) IV Stopped (10/28/19 1923)   heparin 1,100 Units/hr (10/29/19 1400)   piperacillin-tazobactam (ZOSYN)  IV 12.5 mL/hr at 10/29/19 1400   TPN ADULT (ION) 95 mL/hr at 10/29/19 1400   TPN ADULT (ION)       LOS: 17 days    Time spent: [place], MD [facility] To contact the attending provider between 7A-7P or the covering provider during after hours 7P-7A, please log into the web site [email or web address] and access using universal [place] password for that web site. If you do not have the password, please call the hospital operator.  10/29/2019, 3:01 PM

## 2019-10-29 NOTE — Progress Notes (Signed)
Progress Note  Patient Name: Matthew Schwartz Date of Encounter: 10/29/2019  Primary Cardiologist: Candee Furbish, MD  Subjective   Daughter at bedside, patient sleeping. Daughter reports he's finally had a restful night without significant complaints. Temp 100.4 this AM.  Inpatient Medications    Scheduled Meds: . Chlorhexidine Gluconate Cloth  6 each Topical Daily  . cloNIDine  0.2 mg Transdermal Weekly  . insulin aspart  0-20 Units Subcutaneous Q6H  . insulin detemir  10 Units Subcutaneous BID  . lidocaine  1 patch Transdermal Q24H  . mouth rinse  15 mL Mouth Rinse BID  . metoprolol tartrate  5 mg Intravenous Q4H  . nitroGLYCERIN  1 inch Topical Q6H  . pantoprazole (PROTONIX) IV  40 mg Intravenous Q12H  . sodium chloride flush  10-40 mL Intracatheter Q12H  . sodium chloride flush  5 mL Intracatheter Q8H   Continuous Infusions: . sodium chloride Stopped (10/29/19 0052)  . sodium chloride Stopped (10/20/19 2021)  . fluconazole (DIFLUCAN) IV Stopped (10/28/19 1923)  . heparin 1,100 Units/hr (10/29/19 0600)  . piperacillin-tazobactam (ZOSYN)  IV 12.5 mL/hr at 10/29/19 0600  . TPN ADULT (ION) 95 mL/hr at 10/29/19 0600  . TPN ADULT (ION)     PRN Meds: sodium chloride, acetaminophen **OR** acetaminophen, hydrALAZINE, ipratropium-albuterol, lip balm, LORazepam, morphine injection, ondansetron (ZOFRAN) IV, silver nitrate applicators, sodium chloride flush   Vital Signs    Vitals:   10/29/19 0300 10/29/19 0400 10/29/19 0500 10/29/19 0600  BP: (!) 143/58 137/60 (!) 128/58 (!) 129/52  Pulse: 84 86 84 79  Resp: (!) 22 17 (!) 21 (!) 21  Temp:  (!) 100.7 F (38.2 C)    TempSrc:  Axillary    SpO2: 95% 96% 94% 90%  Weight:      Height:        Intake/Output Summary (Last 24 hours) at 10/29/2019 0749 Last data filed at 10/29/2019 7408 Gross per 24 hour  Intake 3212.27 ml  Output 2880 ml  Net 332.27 ml   Last 3 Weights 10/28/2019 10/26/2019 10/23/2019  Weight (lbs) 194 lb 3.6 oz 193 lb  12.6 oz 203 lb 0.7 oz  Weight (kg) 88.1 kg 87.9 kg 92.1 kg     Telemetry    NSR - Personally Reviewed  Physical Exam   GEN: Ill appearing M in NAD, NGT in place HEENT: Normocephalic, atraumatic Neck: No JVD or bruits. Cardiac: RRR no murmurs, rubs, or gallops.  Radials/DP/PT 1+ and equal bilaterally.  Respiratory: Clear to auscultation bilaterally. Breathing is unlabored. GI: Soft, slightly distended, perc drain in place MS: no acute deformity. Extremities: No clubbing or cyanosis. No edema. Distal pedal pulses are 2+ and equal bilaterally. Neuro:  Resting comfortably, does awaken to sound of daughter's voice then falls back asleep Psych:  Deferred  Labs    High Sensitivity Troponin:   Recent Labs  Lab 10/13/19 1014 10/15/19 2124 10/15/19 2314 10/16/19 0237 10/16/19 0500  TROPONINIHS 55* 659* 1,022* 871* 776*      Cardiac EnzymesNo results for input(s): TROPONINI in the last 168 hours. No results for input(s): TROPIPOC in the last 168 hours.   Chemistry Recent Labs  Lab 10/26/19 0320 10/26/19 0320 10/27/19 0508 10/28/19 0759 10/29/19 0345  NA 141   < > 139 138 139  K 4.3   < > 4.3 4.5 4.6  CL 110   < > 108 106 105  CO2 24   < > 21* 23 24  GLUCOSE 107*   < > 128*  113* 126*  BUN 31*   < > 30* 28* 26*  CREATININE 0.94   < > 0.91 0.98 0.90  CALCIUM 8.1*   < > 7.7* 8.0* 8.1*  PROT 6.6  --  6.3*  --  6.8  ALBUMIN 1.8*  --  1.7*  --  1.7*  AST 29  --  27  --  23  ALT 37  --  35  --  27  ALKPHOS 56  --  62  --  61  BILITOT 1.0  --  0.7  --  0.7  GFRNONAA >60   < > >60 >60 >60  GFRAA >60   < > >60 >60 >60  ANIONGAP 7   < > 10 9 10    < > = values in this interval not displayed.     Hematology Recent Labs  Lab 10/27/19 0508 10/28/19 0433 10/29/19 0345  WBC 9.0 8.7 9.3  RBC 3.38* 3.18* 3.29*  HGB 9.0* 8.9* 8.9*  HCT 28.6* 27.7* 28.1*  MCV 84.6 87.1 85.4  MCH 26.6 28.0 27.1  MCHC 31.5 32.1 31.7  RDW 17.1* 17.5* 17.2*  PLT 416* 403* 407*    BNPNo  results for input(s): BNP, PROBNP in the last 168 hours.   DDimer No results for input(s): DDIMER in the last 168 hours.   Radiology    DG UGI W SINGLE CM (SOL OR THIN BA)  Result Date: 10/28/2019 CLINICAL DATA:  Post omental patch repair of duodenal ulcer 10/12/2019. Proximal duodenal upper GI which leak on 10/21/2019 EXAM: UPPER GI SERIES WITH KUB TECHNIQUE: After obtaining a scout radiograph a routine upper GI series was performed using water-soluble contrast FLUOROSCOPY TIME:  Fluoroscopy Time:  2.9 Radiation Exposure Index (if provided by the fluoroscopic device): 158.9 mGy Number of Acquired Spot Images: 13 COMPARISON:  Upper GI 10/21/2019 FINDINGS: Initial KUB demonstrates pigtail catheter in the RIGHT upper quadrant. Water-soluble contrast (200 mL) was hand injected in stomach through the NG tube. Contrast readily flowed through the distal stomach into the duodenum. A small stump of a leak is noted at the level of the first portion the duodenum. This is the site of the more prominent elongated leak demonstrated on prior GI 10/21/2019. The small extraluminal collection measures 4 mm x 2 mm. Previously measured 26 mm by 1 mm. No communication with the pigtail catheter in the RIGHT upper quadrant in the same vicinity. 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 Findings suggest resolving but persistent extraluminal collection. Findings conveyed toWill [place], [place] on 10/28/2019  at12:30. Electronically Signed   By: Suzy Bouchard M.D.   On: 10/28/2019 12:30    Cardiac Studies   Limited echo 6/18 1. Image quality remains technically difficult, making wall motion  evaluation challenging. There is global left ventricular hypokinesis, but  there appears to be disproportionately severe inferolateral wall  hypokinesis. Left ventricular ejection  fraction, by estimation, is 40  to 45%. The left ventricle has mildly  decreased function. The left ventricle has no regional wall motion  abnormalities. Left ventricular diastolic parameters are consistent with  Grade I diastolic dysfunction (impaired  relaxation).  2. Right ventricular systolic function is normal. The right ventricular  size is normal.  3. The mitral valve is normal in structure. No evidence of mitral valve  regurgitation. No evidence of mitral stenosis.  4. The aortic valve is normal  in structure. Aortic valve regurgitation is  not visualized. No aortic stenosis is present.  5. The inferior vena cava is normal in size with greater than 50%  respiratory variability, suggesting right atrial pressure of 3 mmHg.   Comparison(s): Compared to 10/13/2019 The left ventricular function is  unchanged. The right ventricular systolic function has improved. Image  quality remains poor.   Patient Profile     86 y.o. male with h/o CAD s/p RCA PCI in 2000, hypertension, hyperlipidemia, DM. Initially presented with abdominal pain with initial CT/CTA showing no acute process, no ischemia, + chronic appearing renal mass. Returned to ED 10/12/2019 with continued pain, nausea, vomiting and hypoxia. Was found to have peritonitis with perforated duodenal ulcer with septic shock requiring pressors. Underwent emergent exlap with omental patch repair and remained on vent post-op for hypoxic respiratory failure.Cardiology was consulted for NSVT, intermittent left bundle branch block and anterolateral T wave inversions. Amiodarone was started. High-sensitivity troponin peaked at 1022.Cardiac catheterization deferred due to hypoxic respiratory failure and need for abdominal surgery. Has required JP and NGT. Post-op he was noted to have leak from repair and subsequent perc drain to RUA abscess. Other medical issues include DVT in left axillary vein with superficial vein thrombosis in left cephalic vein 1/91/47, anemia of critical  illness, and new LV dysfunction by echocardiogram (LVEF 35-45% on 6/15, 40-45% on 6/18).   Assessment & Plan    1. Perforated duodenal ulcer s/p intervention - gen surgery, IM, palliative following.   2. Elevated troponin in context of known CAD and physiologic stressors - in setting of acute illness, peaked at 1022. Unknown if due to demand ischemia vs NSTEMI. Initially, cardiac cath was considered. However, per subsequent discussions, risk of cath felt to outweigh benefits given lack of symptoms, age, anemia, and acute medical illness. - Woud only do urgent cath if he developed severe symptoms that failed medical management, high risk arrhythmia or STEMI given comorbid issues.  - Recommend restart ASA when OK with surgical team. - Statin on hold given NPO. - Remains on IV beta blocker   3. NSVT on telemetry earlier this admission - continue beta blocker - Mg, K at goal presently.   4. Acute combined heart failure  - remains NPO, limiting advancement of HF regimen - volume status appears stable - remains on clonidine patch, IV metoprolol, NTG patch. Would consider optimizing regimen further once taking orals. - no plan for ischemic eval at this time   5. Essential HTN - BP controlled.  6. DVT - found to have an acute left axillary vein thrombus surrounding PICC line and supervicial thrombus in left cephalic vein. Management has been limited by anemia requiring transfusion, but he is now on heparin - continued management per primary team  For questions or updates, please contact [place] Please consult [email or web address] for contact info under Cardiology/STEMI.  Signed, Charlie Pitter, PA-C 10/29/2019, 7:49 AM

## 2019-10-29 NOTE — Progress Notes (Signed)
Alerted by bedside nurse on transfer from stepdown unit to telemetry floor firing red mews with T-max 102 respiratory rate 30 On exam visibly looks more ill than he was this morning Chest is relatively clear He is tender in his upper right quadrant of his abdomen with some rebound I looked at his wound myself and he has some slight bleeding from one of the areas at the floor of the surgical wound but otherwise it is clean and I do not see any abscess His lower extremities are soft  Chest x-ray my over read shows may be a little bit of fluid but predominantly atelectasis I have ordered a blood culture x1, lactic acid which is to be cycled  I had an extensive conversation at the bedside with the family who were available to chat with me I spoke to his daughter and his wife They want all interventions possible given his prior good health up to 2 weeks ago he was ambulatory and doing light household chores medical  I explained to them the philosophy of palliative care and cautioned them that despite our best medical efforts, there is a real chance he may need to end back on the ventilator again-they will be discussing this amongst themselves over the next several days as a family in terms of if this is something they would want but for tonight he is a full code.  I spoke to Dr. Barry Dienes of general surgery after reviewing the CT scan of the abdomen pelvis as it looks like he has an enlarging hematoma--we have broadened his antibiotics from Zosyn to Primaxin and vancomycin in addition to the fluconazole that he was on previously I have also started saline at 50 cc an hour for now  This could be from the heparin that he is on for his upper extremity DVT which I have stopped and I have explained clearly to family that the benefits of stopping this outweigh the risks of propagation of clot  I have contacted Dr. Genevive Bi of critical care medicine who has graciously agreed to have someone eyeball the patient  overnight and I have spoken to our floor coverage Ms. blount with regards to his decompensation and to also be aware of his hemodynamic changes today  I spent over 1 hour of prolonged service time in caring for this patient and in discussion and coordination with family and care team

## 2019-10-29 NOTE — Progress Notes (Signed)
Occupational Therapy Treatment Patient Details Name: Matthew Schwartz MRN: [id number] DOB: January 26, 1933 Today's Date: 10/29/2019    History of present illness Pt s/p exploratory lap (10/12/19) to repair perforated duodenal ulcer and then on vent with extubation 10/14/19.  Pt with acute respiratory failure with hypoxia and acute systolic CHF in setting of sepsis and peritonitis.  Pt with hx of DM, prostate CA and bil TKR   OT comments  Upon arrival CNA present in room, patient's condom catheter had leaked and patient soiled with urine. Patient require multimodal cues, increased time for processing bed mobility to for total A peri care and donning clean gown. Patient max A x2 to complete rolling and repositioning to increase comfort and offload buttock to promote skin integrity and due to patient complaint of L buttock pain. Instruct patient in AAROM exercises listed below to maximize functional use of UEs during self care tasks such as grooming/hygiene. Will continue to follow.   Follow Up Recommendations  SNF    Equipment Recommendations  Other (comment) (TBD)       Precautions / Restrictions Precautions Precautions: Fall Precaution Comments: 1 right drain, abd, may have loose BM Restrictions Weight Bearing Restrictions: No       Mobility Bed Mobility Overal bed mobility: Needs Assistance Bed Mobility: Rolling Rolling: Max assist;+2 for safety/equipment         General bed mobility comments: multimodal cues and increased time for processing rolling to change linens  Transfers                 General transfer comment: deferred due to fatigue        ADL either performed or assessed with clinical judgement   ADL Overall ADL's : Needs assistance/impaired                             Toileting- Clothing Manipulation and Hygiene: Total assistance;+2 for physical assistance;+2 for safety/equipment;Bed level Toileting - Clothing Manipulation Details (indicate cue type  and reason): upon arrival patient's condom catheter leaked, required total A for peri care                       Cognition Arousal/Alertness: Awake/alert Behavior During Therapy: Flat affect Overall Cognitive Status: Difficult to assess                                 General Comments: patient reports very tired, had recently returned to floor from CT        Exercises Exercises: General Upper Extremity General Exercises - Upper Extremity Shoulder Flexion: AAROM;5 reps;Supine;Both Elbow Flexion: AAROM;5 reps;Supine;Both Elbow Extension: AAROM;Both;5 reps;Supine Digit Composite Flexion: AAROM;Both;5 reps;Supine Composite Extension: AAROM;Both;5 reps;Supine           Pertinent Vitals/ Pain       Pain Assessment: Faces Faces Pain Scale: Hurts even more Pain Location: L buttock and abdomen with mobility Pain Descriptors / Indicators: Sore;Grimacing;Guarding;Moaning Pain Intervention(s): Monitored during session;Repositioned         Frequency  Min 2X/week        Progress Toward Goals  OT Goals(current goals can now be found in the care plan section)  Progress towards OT goals: Not progressing toward goals - comment (progress weakness)  Acute Rehab OT Goals Patient Stated Goal: be more independent OT Goal Formulation: With patient/family Time For Goal Achievement: 11/12/19 Potential to Achieve Goals: Fair  ADL Goals Pt Will Perform Eating: sitting;with supervision Pt Will Perform Grooming: with supervision;sitting Pt Will Perform Upper Body Bathing: with min guard assist;sitting Pt Will Perform Upper Body Dressing: with min guard assist;sitting Pt Will Transfer to Toilet: with mod assist;with max assist;squat pivot transfer;bedside commode Pt Will Perform Toileting - Clothing Manipulation and hygiene: with max assist;sit to/from stand;sitting/lateral leans Pt/caregiver will Perform Home Exercise Program: Increased strength;Both right and left  upper extremity  Plan Discharge plan needs to be updated       AM-PAC OT "6 Clicks" Daily Activity     Outcome Measure   Help from another person eating meals?: Total (NPO) Help from another person taking care of personal grooming?: A Lot Help from another person toileting, which includes using toliet, bedpan, or urinal?: Total Help from another person bathing (including washing, rinsing, drying)?: Total Help from another person to put on and taking off regular upper body clothing?: A Lot Help from another person to put on and taking off regular lower body clothing?: Total 6 Click Score: 8    End of Session  OT Visit Diagnosis: Muscle weakness (generalized) (M62.81);Pain Pain - part of body:  (abdomen)   Activity Tolerance Patient limited by fatigue   Patient Left in bed;with call bell/phone within reach   Nurse Communication Other (comment) (cleared by RN prior to initiating treatment)        Time: 8721-5872 OT Time Calculation (min): 20 min  Charges: OT General Charges $OT Visit: 1 Visit OT Treatments $Self Care/Home Management : 8-22 mins  [name] OT Pager:[phone]   Rosemary Holms 10/29/2019, 2:58 PM

## 2019-10-29 NOTE — Progress Notes (Signed)
Upon admission to floor from ICU, pt red MEWS for axillary temp 102.1, respirations 30. Other VSS. Pt also lethargic and complaining of pain in his abdomen. PRN Tylenol suppository administered as well as PRN morphine. MD Samtani made aware and new orders received to transfer pt back to ICU, as well as STAT DG chest and blood cultures. Pt transferred down to room 1227 and report given to oncoming shift RN.

## 2019-10-29 NOTE — Progress Notes (Signed)
PHARMACY - TOTAL PARENTERAL NUTRITION CONSULT NOTE   Indication: Prolonged ileus  Patient Measurements: Height: 5\' 11"  (180.3 cm) (per daughter) Weight: 88.1 kg (194 lb 3.6 oz) IBW/kg (Calculated) : 75.3   Body mass index is 27.09 kg/m. Usual Weight: 90.7 kg   Assessment:  - Pharmacy is consulted to start TPN in 86 yo male with prolonged ileus. Pt underwent exploratory laparotomy on 6/14 which revealed perforated duodenal ulcer.   Glucose / Insulin: hx of DM - on metformin PTA.  - CBGs < 150 with 60 units/bag insulin in TPN, rSSI q4h (range 118-134 ; goal 100-150) - 12 units of SS insulin required yesterday - Levemir 10 units BID per CCM started 6/18  Electrolytes: K (target >/= 4.0) is 4.6. Magnesium 2.2 (target >/= 2.0); others wnl Renal: SCr wnl,  BUN 26 mproved. 2.3L UOP last 24hr LFTs / TGs: LFTs remain WNL, Tbili improved to WNL after procedure; TG are now WNL after stopping propofol. Previously elevated level likely spurious  Prealbumin / albumin: prealbumin 9.6 (6/28), 9.0 (6/21); albumin 1.7 Intake / Output; MIVF: NS at [facility]; NG output 525 ml charted , UOP 2.5 L GI Imaging:  - 6/21 Upper GI series: small leak from superior margin of duodenal bulb. - 6/23 CT abdomen: 4.3 cm air-fluid collection in the right upper quadrant of the abdomen between the duodenal bulb and gallbladder, not containing enteric contrast. No residual leak from the duodenal bulb identified. Surgeries / Procedures:  - 6/14 Exploratory laparotomy which revealed perforated duodenal ulcer - 6/24 RUQ drain placed > 10 ml > cultured - 6/25 removal R abd JP drain   Central access:  PICC Line 6/15 TPN start date: 6/15  Nutritional Goals:   (per RD recommendation on 6/15): Kcal:  2200-2400 Protein:  115-125 grams Fluid:  >2L/d Goal TPN rate is 95 mL/hr (provides 121 g of protein and 2262 kcals per day)  Current Nutrition:  NPO   Plan:   Continue TPN at 95 mL/hr at 1800, goal rate   Electrolytes  in TPN: 80 mEq/L of Na, 80 mEq/L of K (max in TPN per [facility] policy) , 4 mEq/L of Ca, 12 mEq/L of Magnesium, and 10 mmol/L of Phos. Cl:Ac ratio max Ac  Standard MVI and trace elements to TPN  Decrease to resistant q6h SSI, and monitor Levemir and adjust as needed   Continue 60 units of regular insulin/day in TPN   Continue MIVF to KVO mL/hr at 1800  Monitor TPN labs on Mon/Thurs   Ulice Dash, PharmD, BCPS 10/29/2019 6:55 AM

## 2019-10-29 NOTE — Progress Notes (Addendum)
eLink Physician-Brief Progress Note Patient Name: Dontell Mian DOB: March 09, 1933 MRN: [id number]   Date of Service  10/29/2019  HPI/Events of Note  85 M CAD s/p RCA stent, DM (HbA1C 6.6), initially presented on 6/14 with perforated duodenal ulcer underwent lap omental patch repair, complicated by RUQ abscess formation s/p IR drain 6/24, on heparin drip for VTE left axillary vein and left cephalic vein. Referred back to CCM team for increased work of breathing and fever. CXR without significant findings.  eICU Interventions  On review of CT, enlarging hematoma noted on surgical site. H/H from this morning appears stable. Discussed with Dr [name], CBC to be repeated and heparin drip placed on hold. Surgery service to be informed of results. Empiric antibiotics started. Bedside CCM team informed as well of this referral.     Intervention Category Major Interventions: Hemorrhage - evaluation and management;Respiratory failure - evaluation and management;Sepsis - evaluation and management Evaluation Type: New Patient Evaluation  Judd Lien 10/29/2019, 8:01 PM

## 2019-10-29 NOTE — Progress Notes (Signed)
Pharmacy Antibiotic Note  Matthew Schwartz is a 86 y.o. male admitted on 10/12/2019  With septic shock/peritonitis s/p omental patch repair.   He is currently on Diflucan + Zosyn.  Pharmacy is consulted to broaden Zosyn to Vancomycin + Meropenem for continued fever spikes.   10/29/2019: - D18 Zosyn, D5 Fluconazole - Tm 102.1 - WBC 9.3  Plan: Add Meropenem 1gm IV q8h Add Vanc 1gm IV q12h to target trough 15-45mcg/ml Continue fluconazole 400mg  q24h Follow renal function  Height: 5\' 11"  (180.3 cm) (per daughter) Weight: 88.1 kg (194 lb 3.6 oz) IBW/kg (Calculated) : 75.3  Temp (24hrs), Avg:99.7 F (37.6 C), Min:97.7 F (36.5 C), Max:102.1 F (38.9 C)  Recent Labs  Lab 10/25/19 0530 10/25/19 0803 10/25/19 1845 10/26/19 0320 10/27/19 0508 10/28/19 0433 10/28/19 0759 10/29/19 0345  WBC   < >  --  8.6 8.7 9.0 8.7  --  9.3  CREATININE  --  1.02  --  0.94 0.91  --  0.98 0.90   < > = values in this interval not displayed.    Estimated Creatinine Clearance: 65.1 mL/min (by C-G formula based on SCr of 0.9 mg/dL).    Allergies  Allergen Reactions  . Losartan Potassium Other (See Comments)    Unknown reaction that happened 15 years ago   Antimicrobials this admission:  6/15 Zosyn >> 7/1 6/26 fluconazole >> 7/1 Meropenem>> 7/1 Vanc>>  Microbiology results:  6/14 UCx:  <10k insignificant growth  6/14 MRSA PCR: negative 6/24 AbscessCx: mod Candida albicans 6/25 BCx: ngtd   Thank you for allowing pharmacy to be a part of this patient's care.  Netta Cedars, PharmD, BCPS 10/29/2019, 7:58 PM

## 2019-10-29 NOTE — Progress Notes (Addendum)
[facility] Progress Note Patient Name: Matthew Schwartz DOB: [birth year] MRN: [id number]   Date of Service  10/29/2019  HPI/Events of Note  Notified of occasional bigeminy on EKG Repeat H/H stable  eICU Interventions  Ordered ABG with lytes  Unable to to do lytes on ABG. Ordered BMP, Mg and Phos     Intervention Category Major Interventions: Arrhythmia - evaluation and management  Judd Lien 10/29/2019, 10:04 PM

## 2019-10-29 NOTE — Progress Notes (Signed)
ANTICOAGULATION CONSULT NOTE - Follow Up Consult  Pharmacy Consult for Heparin  Indication: chest pain/ACS, left upper extremity DVT  Allergies  Allergen Reactions  . Losartan Potassium Other (See Comments)    Unknown reaction that happened 15 years ago    Patient Measurements: Height: 5\' 11"  (180.3 cm) (per daughter) Weight: 88.1 kg (194 lb 3.6 oz) IBW/kg (Calculated) : 75.3 Heparin Dosing Weight:   Vital Signs: Temp: 98.8 F (37.1 C) (07/01 0000) Temp Source: Axillary (07/01 0000) BP: 152/67 (07/01 0000) Pulse Rate: 89 (07/01 0000)  Labs: Recent Labs    10/26/19 0320 10/26/19 0320 10/27/19 0508 10/27/19 1702 10/28/19 0230 10/28/19 0433 10/28/19 0759 10/28/19 1224 10/28/19 2129  HGB 9.3*   < > 9.0*  --   --  8.9*  --   --   --   HCT 28.3*  --  28.6*  --   --  27.7*  --   --   --   PLT 419*  --  416*  --   --  403*  --   --   --   HEPARINUNFRC  --   --   --    < > 0.46  --   --  0.54 0.38  CREATININE 0.94  --  0.91  --   --   --  0.98  --   --    < > = values in this interval not displayed.    Estimated Creatinine Clearance: 59.8 mL/min (by C-G formula based on SCr of 0.98 mg/dL).   Medications:  Infusions:  . sodium chloride 10 mL/hr at 10/28/19 2200  . sodium chloride Stopped (10/20/19 2021)  . fluconazole (DIFLUCAN) IV Stopped (10/28/19 1923)  . heparin 1,100 Units/hr (10/28/19 2200)  . piperacillin-tazobactam (ZOSYN)  IV Stopped (10/29/19 0008)  . TPN ADULT (ION) 95 mL/hr at 10/28/19 2200    Assessment: Patient with heparin level at goal.  No heparin issues noted.  Goal of Therapy:  Heparin level 0.3-0.5 units/ml Monitor platelets by anticoagulation protocol: Yes   Plan:  Continue heparin drip at current rate Recheck level with AM labs  Tyler Deis, Shea Stakes Crowford 10/29/2019,2:42 AM

## 2019-10-29 NOTE — Progress Notes (Signed)
Assessment & Plan: POD#17 -Perforated Duodenal Ulcer S/p Exploratory laparotomy, omental patch repair of duodenal ulcer - Dr. [name] - 10/12/2019 Continue NG, NPO; allow ice chips IR drain with purulent fluid - continue to monitor - decreasing volume IV Zosyn TNA Wound care - open with dressing changes - see photo yesterday  Continue NPO, NG decompression, IR drain in place. Again discussed withdaughterat bedside this AM.  UGI series demonstrated healing of perforation / leak following closure.  Plan repeat UGI seriesearly next week to assess healing of perforated ulcer after Delford Field.  If no leakage, Matthew remove NG and allow clear liquid diet.  Continue perc drainage and monitor quality and quantity of output.        [name], MD       [facility]       Office: [phone]   Chief Complaint: Perforated ulcer  Subjective: Patient in bed, sleeping.  Daughter at bedside.  Objective: Vital signs in last 24 hours: Temp:  [97.3 F (36.3 C)-100.7 F (38.2 C)] 100.7 F (38.2 C) (07/01 0400) Pulse Rate:  [75-94] 79 (07/01 0600) Resp:  [15-25] 21 (07/01 0600) BP: (116-157)/(52-97) 129/52 (07/01 0600) SpO2:  [90 %-98 %] 90 % (07/01 0600) Last BM Date: 10/26/19  Intake/Output from previous day: 06/30 0701 - 07/01 0700 In: 3212.3 [P.O.:220; I.V.:2639.5; IV Piggyback:337.8] Out: 2880 [Urine:2150; Emesis/NG output:725; Drains:5] Intake/Output this shift: No intake/output data recorded.  Physical Exam: Abd - dressing intact; perc drain with small tan purulent in bulb; NG with thin bilious  Lab Results:  Recent Labs    10/28/19 0433 10/29/19 0345  WBC 8.7 9.3  HGB 8.9* 8.9*  HCT 27.7* 28.1*  PLT 403* 407*   BMET Recent Labs    10/28/19 0759 10/29/19 0345  NA 138 139  K 4.5 4.6  CL 106 105  CO2 23 24  GLUCOSE 113* 126*  BUN 28* 26*  CREATININE 0.98 0.90    CALCIUM 8.0* 8.1*   PT/INR No results for input(s): LABPROT, INR in the last 72 hours. Comprehensive Metabolic Panel:    Component Value Date/Time   NA 139 10/29/2019 0345   NA 138 10/28/2019 0759   NA 137 11/02/2016 1509   NA 137 06/22/2015 1421   K 4.6 10/29/2019 0345   K 4.5 10/28/2019 0759   K 4.4 11/02/2016 1509   K 4.8 06/22/2015 1421   CL 105 10/29/2019 0345   CL 106 10/28/2019 0759   CO2 24 10/29/2019 0345   CO2 23 10/28/2019 0759   CO2 27 11/02/2016 1509   CO2 25 06/22/2015 1421   BUN 26 (H) 10/29/2019 0345   BUN 28 (H) 10/28/2019 0759   BUN 16.7 11/02/2016 1509   BUN 17.7 06/22/2015 1421   CREATININE 0.90 10/29/2019 0345   CREATININE 0.98 10/28/2019 0759   CREATININE 0.9 11/02/2016 1509   CREATININE 0.9 06/22/2015 1421   GLUCOSE 126 (H) 10/29/2019 0345   GLUCOSE 113 (H) 10/28/2019 0759   GLUCOSE 153 (H) 11/02/2016 1509   GLUCOSE 126 06/22/2015 1421   CALCIUM 8.1 (L) 10/29/2019 0345   CALCIUM 8.0 (L) 10/28/2019 0759   CALCIUM 9.1 11/02/2016 1509   CALCIUM 9.3 06/22/2015 1421   AST 23 10/29/2019 0345   AST 27 10/27/2019 0508   AST 15 11/02/2016 1509   AST 19 06/22/2015 1421   ALT 27 10/29/2019 0345   ALT 35 10/27/2019 0508   ALT 12 11/02/2016 1509   ALT 18 06/22/2015  1421   ALKPHOS 61 10/29/2019 0345   ALKPHOS 62 10/27/2019 0508   ALKPHOS 58 11/02/2016 1509   ALKPHOS 71 06/22/2015 1421   BILITOT 0.7 10/29/2019 0345   BILITOT 0.7 10/27/2019 0508   BILITOT 0.71 11/02/2016 1509   BILITOT 0.40 06/22/2015 1421   PROT 6.8 10/29/2019 0345   PROT 6.3 (L) 10/27/2019 0508   PROT 7.0 11/02/2016 1509   PROT 6.9 06/22/2015 1421   PROT 7.4 06/22/2015 1421   ALBUMIN 1.7 (L) 10/29/2019 0345   ALBUMIN 1.7 (L) 10/27/2019 0508   ALBUMIN 3.6 11/02/2016 1509   ALBUMIN 3.8 06/22/2015 1421    Studies/Results: DG UGI W SINGLE CM (SOL OR THIN BA)  Result Date: 10/28/2019 CLINICAL DATA:  Post omental patch repair of duodenal ulcer 10/12/2019. Proximal duodenal upper  GI which leak on 10/21/2019 EXAM: UPPER GI SERIES WITH KUB TECHNIQUE: After obtaining a scout radiograph a routine upper GI series was performed using water-soluble contrast FLUOROSCOPY TIME:  Fluoroscopy Time:  2.9 Radiation Exposure Index (if provided by the fluoroscopic device): 158.9 mGy Number of Acquired Spot Images: 13 COMPARISON:  Upper GI 10/21/2019 FINDINGS: Initial KUB demonstrates pigtail catheter in the RIGHT upper quadrant. Water-soluble contrast (200 mL) was hand injected in stomach through the NG tube. Contrast readily flowed through the distal stomach into the duodenum. A small stump of a leak is noted at the level of the first portion the duodenum. This is the site of the more prominent elongated leak demonstrated on prior GI 10/21/2019. The small extraluminal collection measures 4 mm x 2 mm. Previously measured 26 mm by 1 mm. No communication with the pigtail catheter in the RIGHT upper quadrant in the same vicinity. 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 Findings suggest resolving but persistent extraluminal collection. Findings conveyed toWill Schwartz, [place] on 10/28/2019  at12:30. Electronically Signed   By: Suzy Bouchard M.D.   On: 10/28/2019 12:30      [name] 10/29/2019  Patient ID: Matthew Schwartz, male   DOB: 1932/08/24, 86 y.o.   MRN: [id number]

## 2019-10-29 NOTE — Progress Notes (Signed)
Referring Physician(s): Ileana Roup (CCS)  Supervising Physician: Jacqulynn Cadet  Patient Status:  [facility] - In-pt  Chief Complaint: None- nonverbal  Subjective:  History of perforated duodenal ulcer s/p exploratory laparotomy with omental patch repair in OR 10/12/2019 by Dr. [name]; complicated by development of RUQ abscess s/p RUQ drain placement in IR 10/22/2019 by Dr. Earleen Newport. Patient laying in bed resting comfortably. He opens eyes to voice. Nonverbal. Daughter at bedside. RUQ drain site c/d/i.   Allergies: Losartan potassium  Medications: Prior to Admission medications   Medication Sig Start Date End Date Taking? Authorizing Provider  alfuzosin (UROXATRAL) 10 MG 24 hr tablet Take 10 mg by mouth daily. 09/07/19  Yes [provider]  amLODipine (NORVASC) 5 MG tablet Take 5 mg by mouth daily. 07/13/19  Yes [provider]  aspirin 81 MG tablet Take 81 mg by mouth every morning.    Yes [provider]  atorvastatin (LIPITOR) 40 MG tablet Take 40 mg by mouth at bedtime.    Yes [provider]  dicyclomine (BENTYL) 20 MG tablet Take 1 tablet (20 mg total) by mouth 2 (two) times daily. 10/11/19  Yes Nuala Alpha A, PA-C  famotidine (PEPCID) 20 MG tablet Take 20 mg by mouth 2 (two) times daily.   Yes [provider]  hydrocortisone valerate cream (WESTCORT) 0.2 % Apply 1 application topically 2 (two) times daily.  09/20/19  Yes [provider]  isosorbide mononitrate (IMDUR) 60 MG 24 hr tablet Take 60 mg by mouth every evening.   Yes [provider]  lactulose (CHRONULAC) 10 GM/15ML solution Take 10 g by mouth as needed for mild constipation.   Yes [provider]  metFORMIN (GLUCOPHAGE) 500 MG tablet Take 500 mg by mouth 2 (two) times daily. 09/20/19  Yes [provider]  metoprolol succinate (TOPROL-XL) 50 MG 24 hr tablet Take 50 mg by mouth 2 (two) times daily. 04/07/14  Yes [provider]  potassium chloride (KLOR-CON) 10 MEQ tablet Take 2 tablets (20 mEq total) by mouth daily for 3 days. 10/11/19 10/14/19 Yes Nuala Alpha A, PA-C  PROCTOSOL HC 2.5 % rectal cream Place 1 application rectally daily as needed for hemorrhoids or itching.  11/06/13  Yes [provider]  ramipril (ALTACE) 10 MG capsule Take 10 mg by mouth 2 (two) times daily.    Yes [provider]  RESTASIS 0.05 % ophthalmic emulsion Place 1 drop into both eyes 2 (two) times daily.  09/09/19  Yes [provider]  senna-docusate (SENOKOT-S) 8.6-50 MG tablet Take 1 tablet by mouth daily. 10/09/19  Yes Lucrezia Starch, MD  [facility] injection Inject as directed once. 04/10/17   [provider]     Vital Signs: BP (!) 128/93   Pulse 98   Temp 98.1 F (36.7 C) (Oral)   Resp (!) 28   Ht 5\' 11"  (1.803 m) Comment: per daughter  Wt 194 lb 3.6 oz (88.1 kg)   SpO2 97%   BMI 27.09 kg/m   Physical Exam Constitutional:      General: He is not in acute distress.    Appearance: He is ill-appearing.  Pulmonary:     Effort: Pulmonary effort is normal. No respiratory distress.  Abdominal:     Comments: (+) midline wound. RUQ drain site without erythema, drainage, or active bleeding; minimal output of purulent drainage in suction bulb.  Skin:    General: Skin is warm and dry.  Neurological:  Mental Status: He is alert.     Imaging: CT ABDOMEN PELVIS W CONTRAST  Result Date: 10/29/2019 CLINICAL DATA:  Possible abscess. EXAM: CT ABDOMEN AND PELVIS WITH CONTRAST TECHNIQUE: Multidetector CT imaging of the abdomen and pelvis was performed using the standard protocol following bolus administration of intravenous contrast. CONTRAST:  190mL OMNIPAQUE IOHEXOL 300 MG/ML  SOLN COMPARISON:  October 21, 2019. FINDINGS: Lower chest: Mild bilateral posterior basilar subsegmental atelectasis is noted. Hepatobiliary: No cholelithiasis is noted. Mild gallbladder distention is noted.  Stable dilatation of common bile duct is noted. No focal abnormality is noted in the liver. Pancreas: Unremarkable. No pancreatic ductal dilatation or surrounding inflammatory changes. Spleen: Normal in size without focal abnormality. Adrenals/Urinary Tract: Adrenal glands appear normal. Stable bilateral renal cysts are noted. No hydronephrosis or renal obstruction is noted. No renal or ureteral calculi are noted. Urinary bladder is unremarkable. Stomach/Bowel: Nasogastric tube tip is seen in proximal stomach. There is no evidence of bowel obstruction or inflammation. The appendix appears normal. Vascular/Lymphatic: Aortic atherosclerosis. No enlarged abdominal or pelvic lymph nodes. Reproductive: Stable postsurgical changes are noted in the prostate gland. Other: Anterior midline surgical incision is again noted. Large irregular high density is noted in this area most consistent with enlarging subcutaneous or intramuscular hematoma. There is been interval placement of pigtail drainage catheter in the area of fluid collection noted on prior exam. Fluid collection is significantly decompressed compared to prior exam. Musculoskeletal: No acute or significant osseous findings. IMPRESSION: 1. Anterior midline surgical incision is again noted. Large irregular high density is noted in this area most consistent with enlarging subcutaneous or intramuscular hematoma. 2. There has been interval placement of pigtail drainage catheter in the area of fluid collection noted on prior exam in the right upper quadrant. Fluid collection is significantly decompressed compared to prior exam. 3. Stable dilatation of common bile duct is noted. 4. Stable bilateral renal cysts are noted. Aortic Atherosclerosis (ICD10-I70.0). Electronically Signed   By: Marijo Conception M.D.   On: 10/29/2019 12:44   DG Chest Port 1 View  Result Date: 10/26/2019 CLINICAL DATA:  Acute respiratory failure with hypoxia, history diabetes mellitus, prostate  cancer, hypertension EXAM: PORTABLE CHEST 1 VIEW COMPARISON:  Portable exam 1121 hours compared to 10/20/2019 FINDINGS: Nasogastric tube extends into stomach. LEFT arm PICC line tip projecting over high RIGHT atrium. Upper normal heart size. Mediastinal contours and pulmonary vascularity normal. Low lung volumes with bibasilar atelectasis. No acute infiltrate, pleural effusion or pneumothorax. IMPRESSION: Decreased lung volumes with bibasilar atelectasis. Electronically Signed   By: Lavonia Dana M.D.   On: 10/26/2019 11:38   DG UGI W SINGLE CM (SOL OR THIN BA)  Result Date: 10/28/2019 CLINICAL DATA:  Post omental patch repair of duodenal ulcer 10/12/2019. Proximal duodenal upper GI which leak on 10/21/2019 EXAM: UPPER GI SERIES WITH KUB TECHNIQUE: After obtaining a scout radiograph a routine upper GI series was performed using water-soluble contrast FLUOROSCOPY TIME:  Fluoroscopy Time:  2.9 Radiation Exposure Index (if provided by the fluoroscopic device): 158.9 mGy Number of Acquired Spot Images: 13 COMPARISON:  Upper GI 10/21/2019 FINDINGS: Initial KUB demonstrates pigtail catheter in the RIGHT upper quadrant. Water-soluble contrast (200 mL) was hand injected in stomach through the NG tube. Contrast readily flowed through the distal stomach into the duodenum. A small stump of a leak is noted at the level of the first portion the duodenum. This is the site of the more prominent elongated leak demonstrated on prior GI 10/21/2019. The small  extraluminal collection measures 4 mm x 2 mm. Previously measured 26 mm by 1 mm. No communication with the pigtail catheter in the RIGHT upper quadrant in the same vicinity. 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 Findings suggest resolving but persistent extraluminal collection. Findings conveyed toWill [place], [place] on 10/28/2019  at12:30.  Electronically Signed   By: Suzy Bouchard M.D.   On: 10/28/2019 12:30    Labs:  CBC: Recent Labs    10/26/19 0320 10/27/19 0508 10/28/19 0433 10/29/19 0345  WBC 8.7 9.0 8.7 9.3  HGB 9.3* 9.0* 8.9* 8.9*  HCT 28.3* 28.6* 27.7* 28.1*  PLT 419* 416* 403* 407*    COAGS: No results for input(s): INR, APTT in the last 8760 hours.  BMP: Recent Labs    10/26/19 0320 10/27/19 0508 10/28/19 0759 10/29/19 0345  NA 141 139 138 139  K 4.3 4.3 4.5 4.6  CL 110 108 106 105  CO2 24 21* 23 24  GLUCOSE 107* 128* 113* 126*  BUN 31* 30* 28* 26*  CALCIUM 8.1* 7.7* 8.0* 8.1*  CREATININE 0.94 0.91 0.98 0.90  GFRNONAA >60 >60 >60 >60  GFRAA >60 >60 >60 >60    LIVER FUNCTION TESTS: Recent Labs    10/25/19 0803 10/26/19 0320 10/27/19 0508 10/29/19 0345  BILITOT 0.9 1.0 0.7 0.7  AST 29 29 27 23   ALT 40 37 35 27  ALKPHOS 55 56 62 61  PROT 6.0* 6.6 6.3* 6.8  ALBUMIN 1.7* 1.8* 1.7* 1.7*    Assessment and Plan:  History of perforated duodenal ulcer s/p exploratory laparotomy with omental patch repair in OR 10/12/2019 by Dr. [name]; complicated by development of RUQ abscess s/p RUQ drain placement in IR 10/22/2019 by Dr. Earleen Newport. RUQ drain stable with minimal output of purulent fluid in suction bulb. CT abdomen/pelvis from today reviewed by Dr. Laurence Ferrari who recommends drain injection prior to possible removal- plan for image-guided RUQ drain injection with possible removal tentatively for tomorrow in IR due to scheduling. Daughter updated on plans bedside. Continue current drain management- continue with Qshift flushes/monitor of output. Further plans per [facility]/CCS/cardiology- appreciate and agree with management. IR to follow.   Electronically Signed: Earley Abide, PA-C 10/29/2019, 3:05 PM   I spent a total of 25 Minutes at the the patient's bedside AND on the patient's hospital floor or unit, greater than 50% of which was counseling/coordinating care for RUQ abscess s/p drain  placement.

## 2019-10-29 NOTE — Progress Notes (Signed)
NAME:  Matthew Schwartz, MRN:  [id number], DOB:  [birth year], LOS: 33 ADMISSION DATE:  10/12/2019, CONSULTATION DATE:  10/12/2019 REFERRING MD:  Dr. [name], Surgery, CHIEF COMPLAINT:  Abdominal pain  Brief History   86 y/o male, former smoker, presented with nausea/vomiting, abdominal pain and constipation x 4 to 5 days.  Seen twice prior to current admit for the same with imaging studies unremarkable for cause.  On return 6/14, seen by GI and found to have concern for acute abdomen and taken for exploratory laparotomy.  Found to have perforated duodenal ulcer s/p omental patch repair.  Remained on vent post op.  PCCM consulted for critical care management.  Past Medical History  Prostate cancer, HTN, GERD, HLD, DM  Significant Hospital Events   6/14 Admit, exploratory laparotomy, omental patch repair of duodenal ulcer 6/16 Off vasopressors, less drainage from abd drain / clearing  6/17 extubated, weak cough, added high flow oxygen 6/18 start heparin gtt 6/20 d/c amiodarone 6/23 new fluid collection on CT abd > Drained by IR 6/24 To [place] 6/25 PCCM sign off 7/01 back to ICU with fever, tachypnea >> PCCM asked to reassess  Consults:  Gastroenterology General surgery Cardiology  Procedures:  Rt IJ CVL 6/14 >> 6/15 Rt radial a line 6/14 >> 6/17 ETT 6/14 >> 6/17 LUE PICC 6/16 >>     Significant Diagnostic Tests:   CT angio abd/pelvis 6/13 >> mild atherosclerosis, no significant arterial stenosis of mesenteric vasculature, no findings of bowel ischemia, colonic diverticulosis w/o diverticulitis, b/l renal cysts, 12 mm lesion upper pole Rt kidney  ECHO 6/15 >> poor image quality, appears to be regional wall motion variability but confident wall motion analysis is impossible even after contrast administration, LVEF ~ 14-48%, Grade I diastolic dysfunction, RV systolic function is moderately reduced, RV is mildly enlarged  6/19 venous doppler LUE >> acute deep vein thrombosis involving the left  axillary vein, surrounding the PICC line. Findings consistent with acute superficial vein thrombosis involving the left cephalic vein.  UGI 6/21 >> small leak from duodenum  CT ABD/Pelvis 6/23 >> interval midline abdominal incision with incomplete skin surgical closure, probable small hematomas within the anterior abdominal wall and the omentum. Nonspecific 4.3 cm air-fluid collection in the RUQ of the abdomen between the duodenal bulb and gallbladder, not containing enteric contrast. No residual leak from the duodenal bulb identified.  New small bilateral pleural effusions with dependent L>R lower lobe opacities  Upper GI series 6/30 >> Small puckered stump of a leak at the site of the prior elongated eak. Findings suggest resolving but persistent extraluminal collection.  CT abd/pelvis 7/01 >> Anterior midline surgical incision is again noted. Large irregular high density is noted in this area most consistent with enlarging subcutaneous or intramuscular hematoma.  Interval placement of pigtail drainage catheter in the area of fluid collection noted on prior exam in the right upper quadrant. Fluid collection is significantly decompressed compared to prior exam.  Micro Data:  SARS CoV2 6/13 >> negative RUQ fluid collection 6/24 >> moderate Candida albicans Blood 6/25 >>   Antimicrobials:  Zosyn 6/14 >> 7/01 Fluconazole 6/26 >> Meropenem 7/01 >> Vancomycin 7/01 >>   Interim history/subjective:  Had fever, abdominal pain earlier this evening.  Noted to have tachypnea with this.  Received morphine and pain symptoms improved.  Noted to have episodes of bigeminy.  Objective   Blood pressure (!) 148/75, pulse 91, temperature (!) 100.6 F (38.1 C), temperature source Axillary, resp. rate (!) 30, height 5'  11" (1.803 m), weight 88.1 kg, SpO2 100 %.        Intake/Output Summary (Last 24 hours) at 10/29/2019 2317 Last data filed at 10/29/2019 1718 Gross per 24 hour  Intake 2111.16 ml  Output 2100  ml  Net 11.16 ml   Filed Weights   10/23/19 0500 10/26/19 0500 10/28/19 0500  Weight: 92.1 kg 87.9 kg 88.1 kg          Examination:  General - sleepy Eyes - pupils reactive ENT - no sinus tenderness, no stridor Cardiac - regular rate/rhythm with occasional extra beats, no murmur Chest - equal breath sounds b/l, no wheezing or rales Abdomen - drain in place, wound dressing clean Extremities - decreased muscle bulk Skin - no rashes Neuro - follows commands  Resolved Hospital Problem list   Septic shock, Acute respiratory failure with hypoxia  Assessment & Plan:   Peritonitis in setting of perforated duodenal ulcer s/p omental patch repair. - post op care, nutrition per CCS - protonix BID - surgery planning to repeat upper GI series weak of 7/05 to monitor healing before advancing diet  RUQ Fluid Collection s/p IR drain with Candida albicans in fluid culture. Recurrent fever 7/01. - ABx changed to vancomycin, meropenem - drain care per CCS and IR - continue fluconazole  NSVT. Acute Systolic CHF in setting of Sepsis likely from Demand Ischemia. Hx of CAD, HTN, HLD. Lt arm DVT in setting of PICC line. - heparin gtt held 7/01 due to concern about possible hematoma at surgical site - goal Mg > 2, K > 4 - goal SBP < 140 - will need LHC at some point when more stable - continue catapres patch, lopressor IV, NTG patch - continue PICC line  DM type II. - SSI with levemir - hold home metformin   Anemia of critical illness and chronic disease. - f/u CBC - transfuse for Hb < 7 or significant bleeding  Malnutrition in the setting of critical illness. - TPN  Goals of care - palliative care consulted   Best practice:  Diet: TPN DVT prophylaxis: SCDs GI prophylaxis: Protonix Mobility: Bed rest / mobilize tolerated  Code Status: full code Disposition: ICU  Labs:   CMP Latest Ref Rng & Units 10/29/2019 10/29/2019 10/28/2019  Glucose 70 - 99 mg/dL 148(H) 126(H)  113(H)  BUN 8 - 23 mg/dL 27(H) 26(H) 28(H)  Creatinine 0.61 - 1.24 mg/dL 0.88 0.90 0.98  Sodium 135 - 145 mmol/L 138 139 138  Potassium 3.5 - 5.1 mmol/L 4.2 4.6 4.5  Chloride 98 - 111 mmol/L 107 105 106  CO2 22 - 32 mmol/L 27 24 23   Calcium 8.9 - 10.3 mg/dL 7.7(L) 8.1(L) 8.0(L)  Total Protein 6.5 - 8.1 g/dL - 6.8 -  Total Bilirubin 0.3 - 1.2 mg/dL - 0.7 -  Alkaline Phos 38 - 126 U/L - 61 -  AST 15 - 41 U/L - 23 -  ALT 0 - 44 U/L - 27 -    CBC Latest Ref Rng & Units 10/29/2019 10/29/2019 10/28/2019  WBC 4.0 - 10.5 K/uL 10.0 9.3 8.7  Hemoglobin 13.0 - 17.0 g/dL 8.9(L) 8.9(L) 8.9(L)  Hematocrit 39 - 52 % 28.2(L) 28.1(L) 27.7(L)  Platelets 150 - 400 K/uL 396 407(H) 403(H)    ABG    Component Value Date/Time   PHART 7.481 (H) 10/29/2019 2230   PCO2ART 33.9 10/29/2019 2230   PO2ART 156 (H) 10/29/2019 2230   HCO3 24.8 10/29/2019 2230   TCO2 22 10/12/2019 1717  ACIDBASEDEF 2.6 (H) 10/13/2019 0311   O2SAT 99.6 10/29/2019 2230    CBG (last 3)  Recent Labs    10/29/19 0526 10/29/19 1158 10/29/19 1705  GLUCAP 126* 111* 156*    Signature:  Chesley Mires, MD [place] Pager - [phone] 10/29/2019, 11:32 PM

## 2019-10-30 ENCOUNTER — Inpatient Hospital Stay (HOSPITAL_COMMUNITY): Payer: Medicare Other

## 2019-10-30 HISTORY — PX: IR SINUS/FIST TUBE CHK-NON GI: IMG673

## 2019-10-30 LAB — MAGNESIUM: Magnesium: 2 mg/dL (ref 1.7–2.4)

## 2019-10-30 LAB — CBC
HCT: 26.2 % — ABNORMAL LOW (ref 39.0–52.0)
Hemoglobin: 8.2 g/dL — ABNORMAL LOW (ref 13.0–17.0)
MCH: 26.9 pg (ref 26.0–34.0)
MCHC: 31.3 g/dL (ref 30.0–36.0)
MCV: 85.9 fL (ref 80.0–100.0)
Platelets: 351 10*3/uL (ref 150–400)
RBC: 3.05 MIL/uL — ABNORMAL LOW (ref 4.22–5.81)
RDW: 17.4 % — ABNORMAL HIGH (ref 11.5–15.5)
WBC: 9.1 10*3/uL (ref 4.0–10.5)
nRBC: 0 % (ref 0.0–0.2)

## 2019-10-30 LAB — BASIC METABOLIC PANEL
Anion gap: 9 (ref 5–15)
BUN: 27 mg/dL — ABNORMAL HIGH (ref 8–23)
CO2: 22 mmol/L (ref 22–32)
Calcium: 7.4 mg/dL — ABNORMAL LOW (ref 8.9–10.3)
Chloride: 107 mmol/L (ref 98–111)
Creatinine, Ser: 0.84 mg/dL (ref 0.61–1.24)
GFR calc Af Amer: >60 mL/min (ref >60)
GFR calc non Af Amer: >60 mL/min (ref >60)
Glucose, Bld: 136 mg/dL — ABNORMAL HIGH (ref 70–99)
Potassium: 3.9 mmol/L (ref 3.5–5.1)
Sodium: 138 mmol/L (ref 135–145)

## 2019-10-30 LAB — GLUCOSE, CAPILLARY
Glucose-Capillary: 100 mg/dL — ABNORMAL HIGH (ref 70–99)
Glucose-Capillary: 132 mg/dL — ABNORMAL HIGH (ref 70–99)
Glucose-Capillary: 136 mg/dL — ABNORMAL HIGH (ref 70–99)
Glucose-Capillary: 143 mg/dL — ABNORMAL HIGH (ref 70–99)

## 2019-10-30 LAB — PHOSPHORUS: Phosphorus: 2.7 mg/dL (ref 2.5–4.6)

## 2019-10-30 MED ORDER — IOHEXOL 300 MG/ML  SOLN
50.0000 mL | Freq: Once | INTRAMUSCULAR | Status: AC | PRN
  Administered 2019-10-30: 15 mL

## 2019-10-30 MED ORDER — TRAVASOL 10 % IV SOLN
INTRAVENOUS | Status: AC
  Filled 2019-10-30: qty 1208.4

## 2019-10-30 NOTE — Progress Notes (Signed)
Pharmacy Antibiotic Note  Matthew Schwartz is a 86 y.o. male admitted on 10/12/2019 with intra-abdominal infection. Pharmacy has been consulted for vancomycin, meropenem, and fluconazole dosing.  10/30/19 9:28 AM - WBC wnl - Tm 101.4 - SCr 0.84 stable  Plan: Continue fluconazole 400mg  q24h  Continue meropenem 1 g iv q 8 h  Continue vancomycin 1000 mg iv q 12 h  Will discuss planned duration of vancomycin with CCM as culture negative so far [place] if plan to continue  Follow renal function, final culture results, clinical course  Height: 5\' 11"  (180.3 cm) (per daughter) Weight: 86.8 kg (191 lb 5.8 oz) IBW/kg (Calculated) : 75.3  Temp (24hrs), Avg:99.1 F (37.3 C), Min:97.5 F (36.4 C), Max:102.1 F (38.9 C)  Recent Labs  Lab 10/27/19 0508 10/28/19 0433 10/28/19 0759 10/29/19 0345 10/29/19 2100 10/29/19 2257 10/30/19 0441  WBC 9.0 8.7  --  9.3 10.0  --  9.1  CREATININE 0.91  --  0.98 0.90  --  0.88 0.84  LATICACIDVEN  --   --   --   --  0.8 0.8  --     Estimated Creatinine Clearance: 69.7 mL/min (by C-G formula based on SCr of 0.84 mg/dL).    Allergies  Allergen Reactions  . Losartan Potassium Other (See Comments)    Unknown reaction that happened 15 years ago   Antimicrobials this admission:  6/15 Zosyn >> 7/1 6/26 fluconazole >> 7/7 meropenem >>  7/1 vancomycin >>   Microbiology results:  6/14 UCx:  <10k insignificant growth  6/14 MRSA PCR: negative 6/24 AbscessCx: mod Candida albicans 6/25 BCx: NGF 7/1 BCx: NGTD   Thank you for allowing pharmacy to be a part of this patient's care.  Ulice Dash, PharmD, BCPS 10/30/2019, 9:27 AM

## 2019-10-30 NOTE — Progress Notes (Signed)
Brief cardiology note: Chart, telemetry reviewed today. He remains strict NPO. Followed closely by PCCM. We will follow peripherally until he is cleared to take PO, but if any new questions/concerns arise please contact the rounding cardiology team and we will be happy to help.  Buford Dresser, MD, PhD [facility]  [address] [place], [place] [address] [phone]

## 2019-10-30 NOTE — Progress Notes (Signed)
PROGRESS NOTE   Matthew Schwartz  PIR:[id number] DOB: 12-Nov-1932 DOA: 10/12/2019 PCP: [name], MD  Brief Narrative:  86 year old indiaan male NIDDM, HLD, GERD, irritable bowel syndrome (constipation) status post left knee replacement 2009, PTCA 2000-->RCA prostate CA, former smoker Admit 10/12/2019 severe abdominal pain-ex lap 10/12/2019-perforated duodenal ulcer and remained on vent NSVT postop on pressors and cardiology consulted-eventually palliative care consulted-extubated 617 He has stabilized but he remains on TPN On 6/30 upper GI series were performed-and CT scan was performed showing enlarging hematoma in his belly and he was transferred to the floor but because of spiking fevers transferred right back to the stepdown unit Antibiotics were adjusted   Assessment & Plan:   Active Problems:   HTN (hypertension)   Malignant neoplasm of prostate (HCC)   Hyperlipidemia   Obstipation   HLD (hyperlipidemia)   Diabetes mellitus type 2, uncontrolled, with complications (HCC)   Perforated bowel ([place])   Perforated duodenal ulcer ([place])   Acute respiratory insufficiency   Elevated troponin level   Acute respiratory failure with hypoxemia (HCC)   PVCs (premature ventricular contractions)   Bilateral atelectasis   Acute abdominal pain   Palliative care by specialist   Goals of care, counseling/discussion   General weakness   1. Septic shock + peritonitis status post omental patch repair 6/14 a. UGI series 6/30 shows persisting small leak per general surgery-defer to them regarding when to start feeds b. 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 He had a T-max of 102 on 7/1 prompting change in antibiotics to vancomycin and meropenem in addition to his antifungal  medications d. We do not have a clear source-he could have had atelectasis or he could have developed some aspiration e. I discussed personally with Dr. [name] on the phone on 7/2 and he did not feel that the enlarging hematoma was of great significance or was of a source of infection as if it was infected this would drain into the midline wound that is present currently 2. Hypoxic respiratory failure extubated 6/17-NG tube in place with current atelectasis a. Seems euvolemic at this time would hold any diuretic b. Monitor trends 3. Acute systolic heart failure + NSTEMI, CAD 2000 a. Remains on beta-blocker and clonidine patch b. Home calcium channel blocker ACE nitrates are on hold because of n.p.o. state c. Cardiology input appreciated and they will follow peripherally 4. Left upper extremity DVT cephalic/axillary vein a. Heparin discontinued on 7/1 after enlarging hematoma b. We will reevaluate on a day-to-day basis probably on 7/3 or 7/4 we can resume it at low-dose 5. DM TY 2 A1c 6.6 a. Not a diabetic prior to admission and probably this is from his TPN b. Sliding scale coverage as needed however would not aggressively control 6. Anemia critical illness + transfusion 6/27 2 units PRBC a. Hemoglobin is stabilized in the 8-9 range after 2 units b. Repeat labs in a.m. 7. Malignancy of prostate a. Outpatient screening follow-up and PSA 8. Former smoker   DVT prophylaxis:  Code Status: full Family Communication: Discussed with daughter bedside this morning in detail 7/2  Disposition:   Status is: Inpatient will be transferring to progressive floor from stepdown as he has been stable for a relatively long period of time  Remains inpatient appropriate because:Ongoing diagnostic testing needed not appropriate for outpatient  work up   [employer]:  Patient From: [place]  Planned Disposition: [place]  Expected discharge date: 11/05/19  Medically stable for  discharge: No    Consultants:   General surgery  Cardiology  Procedures: None  Antimicrobials: IV Zosyn since since 6/14 IV Diflucan since 6/26   Subjective: Events overnight transpired but seems to have stabilized Spoke to the daughter at length at bedside Patient himself is rather quiet feels weak and does not appear in any distress at this time  Objective: Vitals:   10/30/19 1400 10/30/19 1500 10/30/19 1600 10/30/19 1700  BP: (!) 123/54 (!) 107/56 (!) 125/56 (!) 150/68  Pulse: 73 67 79 76  Resp: (!) 21 (!) 22 (!) 21 (!) 24  Temp:      TempSrc:      SpO2: 93% 94% 94% 99%  Weight:      Height:        Intake/Output Summary (Last 24 hours) at 10/30/2019 1824 Last data filed at 10/30/2019 1600 Gross per 24 hour  Intake 3387.9 ml  Output 475 ml  Net 2912.9 ml   Filed Weights   10/26/19 0500 10/28/19 0500 10/30/19 0441  Weight: 87.9 kg 88.1 kg 86.8 kg    Examination:  General exam: Awake alert coherent Respiratory system:no rales no rhonchi Cardiovascular system: S1-S2 no murmur Gastrointestinal system: L large bandage over abdomen-no tenderness over right upper quadrant like prior exam Central nervous system: Intact no focal deficit Extremities: Well perfused lower extremities Skin: As above Psychiatry: Quiet affect  Data Reviewed: I have personally reviewed following labs and imaging studies Hemoglobin 8.2 BUN/creatinine 27/0.8   Radiology Studies: CT ABDOMEN PELVIS W CONTRAST  Result Date: 10/29/2019 CLINICAL DATA:  Possible abscess. EXAM: CT ABDOMEN AND PELVIS WITH CONTRAST TECHNIQUE: Multidetector CT imaging of the abdomen and pelvis was performed using the standard protocol following bolus administration of intravenous contrast. CONTRAST:  169mL OMNIPAQUE IOHEXOL 300 MG/ML  SOLN COMPARISON:  October 21, 2019. FINDINGS: Lower chest: Mild bilateral posterior basilar subsegmental atelectasis is noted. Hepatobiliary: No cholelithiasis is noted. Mild gallbladder  distention is noted. Stable dilatation of common bile duct is noted. No focal abnormality is noted in the liver. Pancreas: Unremarkable. No pancreatic ductal dilatation or surrounding inflammatory changes. Spleen: Normal in size without focal abnormality. Adrenals/Urinary Tract: Adrenal glands appear normal. Stable bilateral renal cysts are noted. No hydronephrosis or renal obstruction is noted. No renal or ureteral calculi are noted. Urinary bladder is unremarkable. Stomach/Bowel: Nasogastric tube tip is seen in proximal stomach. There is no evidence of bowel obstruction or inflammation. The appendix appears normal. Vascular/Lymphatic: Aortic atherosclerosis. No enlarged abdominal or pelvic lymph nodes. Reproductive: Stable postsurgical changes are noted in the prostate gland. Other: Anterior midline surgical incision is again noted. Large irregular high density is noted in this area most consistent with enlarging subcutaneous or intramuscular hematoma. There is been interval placement of pigtail drainage catheter in the area of fluid collection noted on prior exam. Fluid collection is significantly decompressed compared to prior exam. Musculoskeletal: No acute or significant osseous findings. IMPRESSION: 1. Anterior midline surgical incision is again noted. Large irregular high density is noted in this area most consistent with enlarging subcutaneous or intramuscular hematoma. 2. There has been interval placement of pigtail drainage catheter in the area of fluid collection noted on prior exam in the right upper quadrant. Fluid collection is significantly decompressed compared to prior exam. 3. Stable dilatation of common bile duct is noted. 4. Stable bilateral renal  cysts are noted. Aortic Atherosclerosis (ICD10-I70.0). Electronically Signed   By: Marijo Conception M.D.   On: 10/29/2019 12:44   IR Sinus/Fist Tube Chk-Non GI  Result Date: 10/30/2019 INDICATION: 86 year old with history of repaired perforated  duodenal ulcer. Patient continues to have a small amount of purulent output from the right upper quadrant drain. EXAM: SINUS TRACT INJECTION / FISTULOGRAM COMPARISON:  None. MEDICATIONS: None ANESTHESIA/SEDATION: None COMPLICATIONS: None immediate. TECHNIQUE: Patient was placed supine on the fluoroscopic table. Drain was injected with contrast. Subsequently, the drain was flushed with normal saline and attached to a gravity bag. PROCEDURE: Drain was injected with approximately 15 mL Omnipaque 300. FINDINGS: Pigtail catheter in the right upper abdomen. 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 Small irregular cavities around the drain but no large abscess cavity. 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 Drain was switched from a suction bulb to a gravity bag. Contrast fills small irregular cavities around the drain but no large abscess cavity. Electronically Signed   By: Markus Daft M.D.   On: 10/30/2019 17:20   DG Chest Port 1 View  Result Date: 10/29/2019 CLINICAL DATA:  Fever EXAM: PORTABLE CHEST 1 VIEW COMPARISON:  10/26/2019 FINDINGS: Areas of atelectasis in the mid lungs bilaterally. Heart is borderline in size. No effusions. No acute bony abnormality. NG tube and left PICC line remain in place, unchanged. IMPRESSION: Areas of atelectasis bilaterally.  Mild cardiomegaly. Electronically Signed   By: Rolm Baptise M.D.   On: 10/29/2019 19:41     Scheduled Meds: . Chlorhexidine Gluconate Cloth  6 each Topical Daily  . cloNIDine  0.2 mg Transdermal Weekly  . insulin aspart  0-20 Units Subcutaneous Q6H  . insulin detemir  10 Units Subcutaneous BID  . lidocaine  1 patch Transdermal Q24H  . mouth rinse  15 mL Mouth Rinse BID  . metoprolol tartrate  5 mg Intravenous Q4H  . nitroGLYCERIN  1 inch Topical Q6H  . pantoprazole (PROTONIX) IV  40 mg Intravenous Q12H  . sodium chloride flush  10-40 mL Intracatheter Q12H  . sodium chloride flush  5 mL Intracatheter  Q8H   Continuous Infusions: . sodium chloride Stopped (10/30/19 1747)  . sodium chloride Stopped (10/20/19 2021)  . sodium chloride 50 mL/hr at 10/30/19 0120  . fluconazole (DIFLUCAN) IV 400 mg (10/30/19 1813)  . meropenem (MERREM) IV Stopped (10/30/19 1602)  . TPN ADULT (ION) 95 mL/hr at 10/30/19 1800  . vancomycin Stopped (10/30/19 1159)     LOS: 18 days    Time spent: [place], MD [facility] To contact the attending provider between 7A-7P or the covering provider during after hours 7P-7A, please log into the web site [email or web address] and access using universal [place] password for that web site. If you do not have the password, please call the hospital operator.  10/30/2019, 6:24 PM

## 2019-10-30 NOTE — Progress Notes (Signed)
Assessment & Plan: POD#18-Perforated Duodenal Ulcer S/p Exploratory laparotomy, omental patch repair of duodenal ulcer - Dr. [name] - 10/12/2019 Continue NG, NPO; allow ice chips IR drain - CT 10/29/2019 shows decrease in size of collection, drain in position IV Zosyn TNA Wound care - open with dressing changes - hematoma on CT of no consequence, small  Continue NPO, NG decompression, IR drain in place. Again discussed withdaughterat bedside this AM.  Plan repeat UGI seriesnext Tuesday to assess healing of perforated ulcer after [place]. If no leak, will remove NG and allow clear liquid diet. Continue perc drainage and monitor quality and quantity of output.  Encouraged OOB with PT.        [name], MD       [facility]       Office: [phone]   Chief Complaint: Perforated ulcer  Subjective: Patient in bed, daughter at bedside.  Patient more interactive, smiling.  Objective: Vital signs in last 24 hours: Temp:  [97.7 F (36.5 C)-102.1 F (38.9 C)] 98.1 F (36.7 C) (07/02 0400) Pulse Rate:  [42-98] 69 (07/02 0600) Resp:  [17-33] 22 (07/02 0600) BP: (100-175)/(49-93) 140/58 (07/02 0600) SpO2:  [91 %-100 %] 96 % (07/02 0600) Weight:  [86.8 kg] 86.8 kg (07/02 0441) Last BM Date: 10/26/19  Intake/Output from previous day: 07/01 0701 - 07/02 0700 In: 2865.3 [I.V.:2178; IV Piggyback:677.4] Out: 1325 [Urine:1250; Emesis/NG output:75] Intake/Output this shift: No intake/output data recorded.  Physical Exam: HEENT - sclerae clear, mucous membranes moist Neck - soft Abdomen - soft, dressing intact; perc drain with tan, thin, purulent in bulb; NG with thin bilious   Lab Results:  Recent Labs    10/29/19 2100 10/30/19 0441  WBC 10.0 9.1  HGB 8.9* 8.2*  HCT 28.2* 26.2*  PLT 396 351   BMET Recent Labs    10/29/19 2257 10/30/19 0441  NA 138 138  K 4.2 3.9    CL 107 107  CO2 27 22  GLUCOSE 148* 136*  BUN 27* 27*  CREATININE 0.88 0.84  CALCIUM 7.7* 7.4*   PT/INR No results for input(s): LABPROT, INR in the last 72 hours. Comprehensive Metabolic Panel:    Component Value Date/Time   NA 138 10/30/2019 0441   NA 138 10/29/2019 2257   NA 137 11/02/2016 1509   NA 137 06/22/2015 1421   K 3.9 10/30/2019 0441   K 4.2 10/29/2019 2257   K 4.4 11/02/2016 1509   K 4.8 06/22/2015 1421   CL 107 10/30/2019 0441   CL 107 10/29/2019 2257   CO2 22 10/30/2019 0441   CO2 27 10/29/2019 2257   CO2 27 11/02/2016 1509   CO2 25 06/22/2015 1421   BUN 27 (H) 10/30/2019 0441   BUN 27 (H) 10/29/2019 2257   BUN 16.7 11/02/2016 1509   BUN 17.7 06/22/2015 1421   CREATININE 0.84 10/30/2019 0441   CREATININE 0.88 10/29/2019 2257   CREATININE 0.9 11/02/2016 1509   CREATININE 0.9 06/22/2015 1421   GLUCOSE 136 (H) 10/30/2019 0441   GLUCOSE 148 (H) 10/29/2019 2257   GLUCOSE 153 (H) 11/02/2016 1509   GLUCOSE 126 06/22/2015 1421   CALCIUM 7.4 (L) 10/30/2019 0441   CALCIUM 7.7 (L) 10/29/2019 2257   CALCIUM 9.1 11/02/2016 1509   CALCIUM 9.3 06/22/2015 1421   AST 23 10/29/2019 0345   AST 27 10/27/2019 0508   AST 15 11/02/2016 1509   AST 19 06/22/2015 1421   ALT 27 10/29/2019 0345  ALT 35 10/27/2019 0508   ALT 12 11/02/2016 1509   ALT 18 06/22/2015 1421   ALKPHOS 61 10/29/2019 0345   ALKPHOS 62 10/27/2019 0508   ALKPHOS 58 11/02/2016 1509   ALKPHOS 71 06/22/2015 1421   BILITOT 0.7 10/29/2019 0345   BILITOT 0.7 10/27/2019 0508   BILITOT 0.71 11/02/2016 1509   BILITOT 0.40 06/22/2015 1421   PROT 6.8 10/29/2019 0345   PROT 6.3 (L) 10/27/2019 0508   PROT 7.0 11/02/2016 1509   PROT 6.9 06/22/2015 1421   PROT 7.4 06/22/2015 1421   ALBUMIN 1.7 (L) 10/29/2019 0345   ALBUMIN 1.7 (L) 10/27/2019 0508   ALBUMIN 3.6 11/02/2016 1509   ALBUMIN 3.8 06/22/2015 1421    Studies/Results: CT ABDOMEN PELVIS W CONTRAST  Result Date: 10/29/2019 CLINICAL DATA:   Possible abscess. EXAM: CT ABDOMEN AND PELVIS WITH CONTRAST TECHNIQUE: Multidetector CT imaging of the abdomen and pelvis was performed using the standard protocol following bolus administration of intravenous contrast. CONTRAST:  15mL OMNIPAQUE IOHEXOL 300 MG/ML  SOLN COMPARISON:  October 21, 2019. FINDINGS: Lower chest: Mild bilateral posterior basilar subsegmental atelectasis is noted. Hepatobiliary: No cholelithiasis is noted. Mild gallbladder distention is noted. Stable dilatation of common bile duct is noted. No focal abnormality is noted in the liver. Pancreas: Unremarkable. No pancreatic ductal dilatation or surrounding inflammatory changes. Spleen: Normal in size without focal abnormality. Adrenals/Urinary Tract: Adrenal glands appear normal. Stable bilateral renal cysts are noted. No hydronephrosis or renal obstruction is noted. No renal or ureteral calculi are noted. Urinary bladder is unremarkable. Stomach/Bowel: Nasogastric tube tip is seen in proximal stomach. There is no evidence of bowel obstruction or inflammation. The appendix appears normal. Vascular/Lymphatic: Aortic atherosclerosis. No enlarged abdominal or pelvic lymph nodes. Reproductive: Stable postsurgical changes are noted in the prostate gland. Other: Anterior midline surgical incision is again noted. Large irregular high density is noted in this area most consistent with enlarging subcutaneous or intramuscular hematoma. There is been interval placement of pigtail drainage catheter in the area of fluid collection noted on prior exam. Fluid collection is significantly decompressed compared to prior exam. Musculoskeletal: No acute or significant osseous findings. IMPRESSION: 1. Anterior midline surgical incision is again noted. Large irregular high density is noted in this area most consistent with enlarging subcutaneous or intramuscular hematoma. 2. There has been interval placement of pigtail drainage catheter in the area of fluid collection  noted on prior exam in the right upper quadrant. Fluid collection is significantly decompressed compared to prior exam. 3. Stable dilatation of common bile duct is noted. 4. Stable bilateral renal cysts are noted. Aortic Atherosclerosis (ICD10-I70.0). Electronically Signed   By: Marijo Conception M.D.   On: 10/29/2019 12:44   DG Chest Port 1 View  Result Date: 10/29/2019 CLINICAL DATA:  Fever EXAM: PORTABLE CHEST 1 VIEW COMPARISON:  10/26/2019 FINDINGS: Areas of atelectasis in the mid lungs bilaterally. Heart is borderline in size. No effusions. No acute bony abnormality. NG tube and left PICC line remain in place, unchanged. IMPRESSION: Areas of atelectasis bilaterally.  Mild cardiomegaly. Electronically Signed   By: Rolm Baptise M.D.   On: 10/29/2019 19:41   DG UGI W SINGLE CM (SOL OR THIN BA)  Result Date: 10/28/2019 CLINICAL DATA:  Post omental patch repair of duodenal ulcer 10/12/2019. Proximal duodenal upper GI which leak on 10/21/2019 EXAM: UPPER GI SERIES WITH KUB TECHNIQUE: After obtaining a scout radiograph a routine upper GI series was performed using water-soluble contrast FLUOROSCOPY TIME:  Fluoroscopy Time:  2.9 Radiation Exposure Index (if provided by the fluoroscopic device): 158.9 mGy Number of Acquired Spot Images: 13 COMPARISON:  Upper GI 10/21/2019 FINDINGS: Initial KUB demonstrates pigtail catheter in the RIGHT upper quadrant. Water-soluble contrast (200 mL) was hand injected in stomach through the NG tube. Contrast readily flowed through the distal stomach into the duodenum. A small stump of a leak is noted at the level of the first portion the duodenum. This is the site of the more prominent elongated leak demonstrated on prior GI 10/21/2019. The small extraluminal collection measures 4 mm x 2 mm. Previously measured 26 mm by 1 mm. No communication with the pigtail catheter in the RIGHT upper quadrant in the same vicinity. 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 Findings suggest resolving but persistent extraluminal collection. Findings conveyed toWill [place], [place] on 10/28/2019  at12:30. Electronically Signed   By: Suzy Bouchard M.D.   On: 10/28/2019 12:30      [name] 10/30/2019  Patient ID: Matthew Schwartz, male   DOB: December 12, 1932, 86 y.o.   MRN: [id number]

## 2019-10-30 NOTE — Progress Notes (Signed)
PHARMACY - TOTAL PARENTERAL NUTRITION CONSULT NOTE   Indication: Prolonged ileus  Patient Measurements: Height: 5\' 11"  (180.3 cm) (per daughter) Weight: 86.8 kg (191 lb 5.8 oz) IBW/kg (Calculated) : 75.3   Body mass index is 26.69 kg/m. Usual Weight: 90.7 kg   Assessment:  - Pharmacy is consulted to start TPN in 86 yo male with prolonged ileus. Pt underwent exploratory laparotomy on 6/14 which revealed perforated duodenal ulcer.   Glucose / Insulin: hx of DM - on metformin PTA.  - CBGs < 150 with 60 units/bag insulin in TPN, rSSI q4h (range 111-156 ; goal 100-150) - 10 units of SS insulin required yesterday - Levemir 10 units BID per CCM started 6/18  Electrolytes: K (target >/= 4.0) is 3.9. Magnesium 2.0 (target >/= 2.0), CCa 9.2; others wnl Renal: 0.84,  BUN 27 mproved. 1.25L UOP last 24hr decreased LFTs / TGs: LFTs remain WNL, Tbili improved to WNL after procedure; TG are now WNL after stopping propofol. Previously elevated level likely spurious  Prealbumin / albumin: prealbumin 9.6 (6/28), 9.0 (6/21); albumin 1.7 (7/1) Intake / Output; MIVF: [facility] at [facility]; NG output 75 ml charted , UOP 1.25 L GI Imaging:  - 6/21 Upper GI series: small leak from superior margin of duodenal bulb. - 6/23 CT abdomen: 4.3 cm air-fluid collection in the right upper quadrant of the abdomen between the duodenal bulb and gallbladder, not containing enteric contrast. No residual leak from the duodenal bulb identified. Surgeries / Procedures:  - 6/14 Exploratory laparotomy which revealed perforated duodenal ulcer - 6/24 RUQ drain placed > 10 ml > cultured - 6/25 removal R abd JP drain   Central access:  PICC Line 6/15 TPN start date: 6/15  Nutritional Goals:   (per RD recommendation on 6/15): Kcal:  2200-2400 Protein:  115-125 grams Fluid:  >2L/d Goal TPN rate is 95 mL/hr (provides 121 g of protein and 2262 kcals per day)  Current Nutrition:  NPO   Plan:   Continue TPN at 95 mL/hr at 1800,  goal rate   Electrolytes in TPN: 80 mEq/L of Na, inc 85 mEq/L of K (below 40 meq/L) , 4 mEq/L of Ca, inc 15 mEq/L of Magnesium, and 10 mmol/L of Phos. Cl:Ac ratio max Ac  Standard MVI and trace elements to TPN  Continue resistant q6h SSI, and monitor Levemir and adjust as needed   Continue 60 units of regular insulin/day in TPN   Continue MIVF to KVO mL/hr at 1800  Monitor TPN labs on Mon/Thurs   Ulice Dash, PharmD, BCPS 10/30/2019 7:20 AM

## 2019-10-30 NOTE — TOC Progression Note (Signed)
Transition of Care [facility]) - Progression Note    Patient Details  Name: Matthew Schwartz MRN: [id number] Date of Birth: October 10, 1932  [facility] CM/SW Contact  Leeroy Cha, RN Phone Number: 10/30/2019, 7:55 AM  Clinical Narrative:    Temp 102.1 this am, iv tylenol,diflucan, merre, and vaco, iv tpn, bld cultures x2x24hrs=neg, ct scan of abd showed fld collection at the incision pig tail drain inserted. Plan follow for progression and for toc needs.   Expected Discharge Plan: IP Rehab Facility Barriers to Discharge: Continued Medical Work up  Expected Discharge Plan and Services Expected Discharge Plan: [place]   Discharge Planning Services: CM Consult   Living arrangements for the past 2 months: Single Family Home                                       Social Determinants of Health (SDOH) Interventions    Readmission Risk Interventions No flowsheet data found.

## 2019-10-30 NOTE — Progress Notes (Addendum)
NAME:  Matthew Schwartz, MRN:  [id number], DOB:  06/15/1932, LOS: 76 ADMISSION DATE:  10/12/2019, CONSULTATION DATE:  10/12/2019 REFERRING MD:  Dr. [name], Surgery, CHIEF COMPLAINT:  Abdominal pain  Brief History   86 y/o male, former smoker, presented with nausea/vomiting, abdominal pain and constipation x 4 to 5 days.  Seen twice prior to current admit for the same with imaging studies unremarkable for cause.  On return 6/14, seen by GI and found to have concern for acute abdomen and taken for exploratory laparotomy.  Found to have perforated duodenal ulcer s/p omental patch repair.  Remained on vent post op.  PCCM consulted for critical care management.  Past Medical History  Prostate cancer, HTN, GERD, HLD, DM  Significant Hospital Events   6/14 Admit, exploratory laparotomy, omental patch repair of duodenal ulcer 6/16 Off vasopressors, less drainage from abd drain / clearing  6/17 extubated, weak cough, added high flow oxygen 6/18 start heparin gtt 6/20 d/c amiodarone 6/23 new fluid collection on CT abd > Drained by IR 6/24 To [place] 6/25 PCCM sign off 7/01 back to ICU with fever, tachypnea >> PCCM asked to reassess 7/2 comfortable this morning  Consults:  Gastroenterology General surgery Cardiology  Procedures:  Rt IJ CVL 6/14 >> 6/15 Rt radial a line 6/14 >> 6/17 ETT 6/14 >> 6/17 LUE PICC 6/16 >>     Significant Diagnostic Tests:   CT angio abd/pelvis 6/13 >> mild atherosclerosis, no significant arterial stenosis of mesenteric vasculature, no findings of bowel ischemia, colonic diverticulosis w/o diverticulitis, b/l renal cysts, 12 mm lesion upper pole Rt kidney  ECHO 6/15 >> poor image quality, appears to be regional wall motion variability but confident wall motion analysis is impossible even after contrast administration, LVEF ~ 29-92%, Grade I diastolic dysfunction, RV systolic function is moderately reduced, RV is mildly enlarged  6/19 venous doppler LUE >> acute deep vein  thrombosis involving the left axillary vein, surrounding the PICC line. Findings consistent with acute superficial vein thrombosis involving the left cephalic vein.  UGI 6/21 >> small leak from duodenum  CT ABD/Pelvis 6/23 >> interval midline abdominal incision with incomplete skin surgical closure, probable small hematomas within the anterior abdominal wall and the omentum. Nonspecific 4.3 cm air-fluid collection in the RUQ of the abdomen between the duodenal bulb and gallbladder, not containing enteric contrast. No residual leak from the duodenal bulb identified.  New small bilateral pleural effusions with dependent L>R lower lobe opacities  Upper GI series 6/30 >> Small puckered stump of a leak at the site of the prior elongated eak. Findings suggest resolving but persistent extraluminal collection.  CT abd/pelvis 7/01 >> Anterior midline surgical incision is again noted. Large irregular high density is noted in this area most consistent with enlarging subcutaneous or intramuscular hematoma.  Interval placement of pigtail drainage catheter in the area of fluid collection noted on prior exam in the right upper quadrant. Fluid collection is significantly decompressed compared to prior exam.  Micro Data:  SARS CoV2 6/13 >> negative RUQ fluid collection 6/24 >> moderate Candida albicans Blood 6/25 >>   Antimicrobials:  Zosyn 6/14 >> 7/01 Fluconazole 6/26 >> Meropenem 7/01 >> Vancomycin 7/01 >>   Interim history/subjective:  Denies any significant overnight events Family upset about the move to the medical floor yesterday and return back to the ICU soon afterwards Denies any pain or discomfort at present Heart rate is better No fever  Objective   Blood pressure (!) 140/58, pulse 69, temperature (!) 97.5  F (36.4 C), temperature source Oral, resp. rate (!) 22, height 5\' 11"  (1.803 m), weight 86.8 kg, SpO2 96 %.        Intake/Output Summary (Last 24 hours) at 10/30/2019 0953 Last data  filed at 10/30/2019 0630 Gross per 24 hour  Intake 2642.8 ml  Output 1325 ml  Net 1317.8 ml   Filed Weights   10/26/19 0500 10/28/19 0500 10/30/19 0441  Weight: 87.9 kg 88.1 kg 86.8 kg   Examination:  General - sleepy, arousable and interactive Eyes - pupils reactive HEENT -moist oral mucosa Cardiac -S1-S2 appreciated Chest -clear breath sounds bilaterally Abdomen - drain in place, wound dressing clean Extremities - decreased muscle bulk Skin - no rashes Neuro - follows commands  Resolved Hospital Problem list   Septic shock, Acute respiratory failure with hypoxia  Assessment & Plan:   Peritonitis in setting of perforated duodenal ulcer s/p omental patch repair. - post op care, nutrition per CCS - protonix BID - surgery planning to repeat upper GI series weak of 7/05 to monitor healing before advancing diet -Ice chips as tolerated  RUQ Fluid Collection s/p IR drain with Candida albicans in fluid culture. Recurrent fever 7/01. - ABx changed to vancomycin, meropenem -Coverage widened secondary to presence of hematoma - drain care per CCS and IR - continue fluconazole  NSVT. Acute Systolic CHF in setting of Sepsis likely from Demand Ischemia. Hx of CAD, HTN, HLD. Lt arm DVT in setting of PICC line. - heparin gtt held 7/01 due to concern about possible hematoma at surgical site-enlarging compared to previous - goal Mg > 2, K > 4 - goal SBP < 140 - will need LHC at some point when more stable - continue catapres patch, lopressor IV, NTG patch - continue PICC line  DM type II. - SSI with levemir - hold home metformin   Anemia of critical illness and chronic disease. - f/u CBC -H&H is relatively stable - transfuse for Hb < 7 or significant bleeding  Malnutrition in the setting of critical illness. - TPN  Goals of care - palliative care consulted  Not requiring pressors Hemodynamically stable  Discussed with 2 daughters at bedside Upset about patient being  moved out of the unit yesterday They feel he is still sick enough to be in the unit especially with his significant decompensation soon after he was transferred They request to be kept in the loop with respect to transitioning out of the unit Updated about any significant findings  I did reassure them that we will continue to treat him as aggressively as we can and will inform them of any changes  Best practice:  Diet: TPN DVT prophylaxis: SCDs GI prophylaxis: Protonix Mobility: Bed rest / mobilize tolerated  Code Status: full code Disposition: ICU  Labs:   CMP Latest Ref Rng & Units 10/30/2019 10/29/2019 10/29/2019  Glucose 70 - 99 mg/dL 136(H) 148(H) 126(H)  BUN 8 - 23 mg/dL 27(H) 27(H) 26(H)  Creatinine 0.61 - 1.24 mg/dL 0.84 0.88 0.90  Sodium 135 - 145 mmol/L 138 138 139  Potassium 3.5 - 5.1 mmol/L 3.9 4.2 4.6  Chloride 98 - 111 mmol/L 107 107 105  CO2 22 - 32 mmol/L 22 27 24   Calcium 8.9 - 10.3 mg/dL 7.4(L) 7.7(L) 8.1(L)  Total Protein 6.5 - 8.1 g/dL - - 6.8  Total Bilirubin 0.3 - 1.2 mg/dL - - 0.7  Alkaline Phos 38 - 126 U/L - - 61  AST 15 - 41 U/L - - 23  ALT 0 - 44 U/L - - 27    CBC Latest Ref Rng & Units 10/30/2019 10/29/2019 10/29/2019  WBC 4.0 - 10.5 K/uL 9.1 10.0 9.3  Hemoglobin 13.0 - 17.0 g/dL 8.2(L) 8.9(L) 8.9(L)  Hematocrit 39 - 52 % 26.2(L) 28.2(L) 28.1(L)  Platelets 150 - 400 K/uL 351 396 407(H)    ABG    Component Value Date/Time   PHART 7.481 (H) 10/29/2019 2230   PCO2ART 33.9 10/29/2019 2230   PO2ART 156 (H) 10/29/2019 2230   HCO3 24.8 10/29/2019 2230   TCO2 22 10/12/2019 1717   ACIDBASEDEF 2.6 (H) 10/13/2019 0311   O2SAT 99.6 10/29/2019 2230    CBG (last 3)  Recent Labs    10/29/19 1705 10/29/19 2327 10/30/19 0535  GLUCAP 156* 135* 136*   Sherrilyn Rist, MD [facility] Pager: [phone]

## 2019-10-31 ENCOUNTER — Inpatient Hospital Stay (HOSPITAL_COMMUNITY): Payer: Medicare Other

## 2019-10-31 DIAGNOSIS — R0689 Other abnormalities of breathing: Secondary | ICD-10-CM

## 2019-10-31 DIAGNOSIS — R1013 Epigastric pain: Secondary | ICD-10-CM

## 2019-10-31 DIAGNOSIS — R109 Unspecified abdominal pain: Secondary | ICD-10-CM

## 2019-10-31 LAB — CBC
HCT: 29.3 % — ABNORMAL LOW (ref 39.0–52.0)
Hemoglobin: 9.1 g/dL — ABNORMAL LOW (ref 13.0–17.0)
MCH: 26.8 pg (ref 26.0–34.0)
MCHC: 31.1 g/dL (ref 30.0–36.0)
MCV: 86.2 fL (ref 80.0–100.0)
Platelets: 394 10*3/uL (ref 150–400)
RBC: 3.4 MIL/uL — ABNORMAL LOW (ref 4.22–5.81)
RDW: 17.2 % — ABNORMAL HIGH (ref 11.5–15.5)
WBC: 9.3 10*3/uL (ref 4.0–10.5)
nRBC: 0 % (ref 0.0–0.2)

## 2019-10-31 LAB — COMPREHENSIVE METABOLIC PANEL
ALT: 20 U/L (ref 0–44)
AST: 16 U/L (ref 15–41)
Albumin: 1.6 g/dL — ABNORMAL LOW (ref 3.5–5.0)
Alkaline Phosphatase: 56 U/L (ref 38–126)
Anion gap: 8 (ref 5–15)
BUN: 25 mg/dL — ABNORMAL HIGH (ref 8–23)
CO2: 24 mmol/L (ref 22–32)
Calcium: 7.9 mg/dL — ABNORMAL LOW (ref 8.9–10.3)
Chloride: 103 mmol/L (ref 98–111)
Creatinine, Ser: 0.71 mg/dL (ref 0.61–1.24)
GFR calc Af Amer: >60 mL/min (ref >60)
GFR calc non Af Amer: >60 mL/min (ref >60)
Glucose, Bld: 138 mg/dL — ABNORMAL HIGH (ref 70–99)
Potassium: 4.3 mmol/L (ref 3.5–5.1)
Sodium: 135 mmol/L (ref 135–145)
Total Bilirubin: 0.8 mg/dL (ref 0.3–1.2)
Total Protein: 7 g/dL (ref 6.5–8.1)

## 2019-10-31 LAB — GLUCOSE, CAPILLARY
Glucose-Capillary: 119 mg/dL — ABNORMAL HIGH (ref 70–99)
Glucose-Capillary: 127 mg/dL — ABNORMAL HIGH (ref 70–99)
Glucose-Capillary: 138 mg/dL — ABNORMAL HIGH (ref 70–99)
Glucose-Capillary: 161 mg/dL — ABNORMAL HIGH (ref 70–99)

## 2019-10-31 LAB — PHOSPHORUS: Phosphorus: 2.2 mg/dL — ABNORMAL LOW (ref 2.5–4.6)

## 2019-10-31 LAB — MAGNESIUM: Magnesium: 2 mg/dL (ref 1.7–2.4)

## 2019-10-31 LAB — VANCOMYCIN, TROUGH: Vancomycin Tr: 13 ug/mL — ABNORMAL LOW (ref 15–20)

## 2019-10-31 MED ORDER — HEPARIN (PORCINE) 25000 UT/250ML-% IV SOLN
1100.0000 [IU]/h | INTRAVENOUS | Status: DC
  Administered 2019-10-31: 1100 [IU]/h via INTRAVENOUS
  Filled 2019-10-31: qty 250

## 2019-10-31 MED ORDER — BISACODYL 10 MG RE SUPP
10.0000 mg | Freq: Every day | RECTAL | Status: DC | PRN
  Administered 2019-10-31 – 2019-11-11 (×3): 10 mg via RECTAL
  Filled 2019-10-31 (×4): qty 1

## 2019-10-31 MED ORDER — VANCOMYCIN HCL 1250 MG/250ML IV SOLN
1250.0000 mg | Freq: Two times a day (BID) | INTRAVENOUS | Status: DC
  Administered 2019-10-31 – 2019-11-02 (×5): 1250 mg via INTRAVENOUS
  Filled 2019-10-31 (×6): qty 250

## 2019-10-31 MED ORDER — SODIUM PHOSPHATES 45 MMOLE/15ML IV SOLN
15.0000 mmol | Freq: Once | INTRAVENOUS | Status: AC
  Administered 2019-10-31: 15 mmol via INTRAVENOUS
  Filled 2019-10-31: qty 5

## 2019-10-31 MED ORDER — TRAVASOL 10 % IV SOLN
INTRAVENOUS | Status: AC
  Filled 2019-10-31: qty 1208.4

## 2019-10-31 NOTE — Progress Notes (Signed)
NAME:  Matthew Schwartz, MRN:  [id number], DOB:  [birth year], LOS: 73 ADMISSION DATE:  10/12/2019, CONSULTATION DATE:  6/14 REFERRING MD:  [name], CHIEF COMPLAINT:  Abdominal pain   Brief History   86 y/o male, former smoker, presented with nausea/vomiting, abdominal pain and constipation x 4 to 5 days.  Seen twice prior to current admit for the same with imaging studies unremarkable for cause.  On return 6/14, seen by GI and found to have concern for acute abdomen and taken for exploratory laparotomy.  Found to have perforated duodenal ulcer s/p omental patch repair.  Remained on vent post op.  PCCM consulted for critical care management. Extubated on 6/17, had IR drain of abdominal fluid on 6/23. PCCM signed off on 6/25.  PCCM asked to see again for dyspnea on 7/1.  Past Medical History  Prostate cancer, HTN, GERD, HLD, DM  Significant Hospital Events   6/14 Admit, exploratory laparotomy, omental patch repair of duodenal ulcer 6/16 Off vasopressors, less drainage from abd drain / clearing  6/17 extubated, weak cough, added high flow oxygen 6/18 start heparin gtt 6/20 d/c amiodarone 6/23 new fluid collection on CT abd > Drained by IR 6/24 To [facility] 6/25 PCCM sign off 7/01 back to ICU with fever, tachypnea >> PCCM asked to reassess 7/2 comfortable this morning  Consults:  Gastroenterology General surgery Cardiology  Procedures:  Rt IJ CVL 6/14 >> 6/15 Rt radial a line 6/14 >> 6/17 ETT 6/14 >> 6/17 LUE PICC 6/16 >>   Significant Diagnostic Tests:   CT angio abd/pelvis 6/13 >> mild atherosclerosis, no significant arterial stenosis of mesenteric vasculature, no findings of bowel ischemia, colonic diverticulosis w/o diverticulitis, b/l renal cysts, 12 mm lesion upper pole Rt kidney  ECHO 6/15 >> poor image quality, appears to be regional wall motion variability but confident wall motion analysis is impossible even after contrast administration, LVEF ~ 58-09%, Grade I diastolic dysfunction,  RV systolic function is moderately reduced, RV is mildly enlarged  6/19 venous doppler LUE >> acute deep vein thrombosis involving the left axillary vein, surrounding the PICC line. Findings consistent with acute superficial vein thrombosis involving the left cephalic vein.  UGI 6/21 >> small leak from duodenum  CT ABD/Pelvis 6/23 >> interval midline abdominal incision with incomplete skin surgical closure, probable small hematomas within the anterior abdominal wall and the omentum. Nonspecific 4.3 cm air-fluid collection in the RUQ of the abdomen between the duodenal bulb and gallbladder, not containing enteric contrast. No residual leak from the duodenal bulb identified.  New small bilateral pleural effusions with dependent L>R lower lobe opacities  Upper GI series 6/30 >> Small puckered stump of a leak at the site of the prior elongated eak. Findings suggest resolving but persistent extraluminal collection.  CT abd/pelvis 7/01 >> Anterior midline surgical incision is again noted. Large irregular high density is noted in this area most consistent with enlarging subcutaneous or intramuscular hematoma.  Interval placement of pigtail drainage catheter in the area of fluid collection noted on prior exam in the right upper quadrant. Fluid collection is significantly decompressed compared to prior exam.  Micro Data:  SARS CoV2 6/13 >> negative RUQ fluid collection 6/24 >> moderate Candida albicans Blood 6/25 >>   Antimicrobials:  Zosyn 6/14 >> 7/01 Fluconazole 6/26 >> Meropenem 7/01 >> Vancomycin 7/01 >>   Interim history/subjective:  Constipated Denies pain  Objective   Blood pressure (!) 157/62, pulse 80, temperature 97.6 F (36.4 C), temperature source Oral, resp. rate 14, height 5'  11" (1.803 m), weight 93.6 kg, SpO2 99 %.        Intake/Output Summary (Last 24 hours) at 10/31/2019 0805 Last data filed at 10/31/2019 0515 Gross per 24 hour  Intake 4211.19 ml  Output 2800 ml  Net  1411.19 ml   Filed Weights   10/28/19 0500 10/30/19 0441 10/31/19 0400  Weight: 88.1 kg 86.8 kg 93.6 kg    Examination: General:  Resting comfortably in bed HENT: NCAT OP clear, NG in place PULM: CTA B, normal effort CV: RRR, no mgr GI: BS+, soft, drain in place, midline scar well dressed MSK: normal bulk and tone Neuro: drowsy but wakes to voice, speaks mostly to daughter, follows commands  Resolved Hospital Problem list   Septic shock, Acute respiratory failure with hypoxia  Assessment & Plan:  Peritonitis in setting of perforated duodenal ulcer s/p omental patch repair NG in place, to suction NPO Ice chips Plan repeat upper GI series next week, NPO until then  RUQ fluid collection s/p IR drain with candida albicans in culture Fever 7/1 Abx per primary  Acute systolic heart failure in setting of sepsis > improved Demand ischemia> improved History of CAD, hypertension, hyperlipidemia Tele Nitropaste IV metoprolol clonidine  L arm DVT related to PICC Heparin currently on hold due to concern about hematoma at surgical site Would consider restarting heparin with out bolus if OK by surgery/TRH  DM2 Levemir/SSI accuchecks  Anemia of critical illness and chronic disease Monitor for bleeding Transfuse PRBC for Hgb < 7 gm/dL  Moderate Malnutrition in setting of critical illness TPN  From my perspective this patient no longer needs ICU level care though I think the family prefers for him to remain in step down status.  Should we need more beds for other critically ill patients then he needs to go out to progressive care.  PCCM available PRN  Best practice:   PER TRH   Labs   CBC: Recent Labs  Lab 10/26/19 0320 10/26/19 0320 10/27/19 2025 10/27/19 4270 10/28/19 0433 10/29/19 0345 10/29/19 2100 10/30/19 0441 10/31/19 0245  WBC 8.7   < > 9.0   < > 8.7 9.3 10.0 9.1 9.3  NEUTROABS 5.9  --  6.2  --  5.9 6.4 7.4  --   --   HGB 9.3*   < > 9.0*   < > 8.9*  8.9* 8.9* 8.2* 9.1*  HCT 28.3*   < > 28.6*   < > 27.7* 28.1* 28.2* 26.2* 29.3*  MCV 82.5   < > 84.6   < > 87.1 85.4 85.5 85.9 86.2  PLT 419*   < > 416*   < > 403* 407* 396 351 394   < > = values in this interval not displayed.    Basic Metabolic Panel: Recent Labs  Lab 10/28/19 0759 10/29/19 0345 10/29/19 2257 10/30/19 0441 10/31/19 0245  NA 138 139 138 138 135  K 4.5 4.6 4.2 3.9 4.3  CL 106 105 107 107 103  CO2 23 24 27 22 24   GLUCOSE 113* 126* 148* 136* 138*  BUN 28* 26* 27* 27* 25*  CREATININE 0.98 0.90 0.88 0.84 0.71  CALCIUM 8.0* 8.1* 7.7* 7.4* 7.9*  MG 2.2 2.2 2.2 2.0 2.0  PHOS 3.0 2.8 2.8 2.7 2.2*   GFR: Estimated Creatinine Clearance: 80.3 mL/min (by C-G formula based on SCr of 0.71 mg/dL). Recent Labs  Lab 10/29/19 0345 10/29/19 2100 10/29/19 2257 10/30/19 0441 10/31/19 0245  WBC 9.3 10.0  --  9.1  9.3  LATICACIDVEN  --  0.8 0.8  --   --     Liver Function Tests: Recent Labs  Lab 10/25/19 0803 10/26/19 0320 10/27/19 0508 10/29/19 0345 10/31/19 0245  AST 29 29 27 23 16   ALT 40 37 35 27 20  ALKPHOS 55 56 62 61 56  BILITOT 0.9 1.0 0.7 0.7 0.8  PROT 6.0* 6.6 6.3* 6.8 7.0  ALBUMIN 1.7* 1.8* 1.7* 1.7* 1.6*   No results for input(s): LIPASE, AMYLASE in the last 168 hours. No results for input(s): AMMONIA in the last 168 hours.  ABG    Component Value Date/Time   PHART 7.481 (H) 10/29/2019 2230   PCO2ART 33.9 10/29/2019 2230   PO2ART 156 (H) 10/29/2019 2230   HCO3 24.8 10/29/2019 2230   TCO2 22 10/12/2019 1717   ACIDBASEDEF 2.6 (H) 10/13/2019 0311   O2SAT 99.6 10/29/2019 2230     Coagulation Profile: No results for input(s): INR, PROTIME in the last 168 hours.  Cardiac Enzymes: No results for input(s): CKTOTAL, CKMB, CKMBINDEX, TROPONINI in the last 168 hours.  HbA1C: Hgb A1c MFr Bld  Date/Time Value Ref Range Status  10/12/2019 02:30 PM 6.6 (H) 4.8 - 5.6 % Final    Comment:    (NOTE) Pre diabetes:          5.7%-6.4%  Diabetes:               >6.4%  Glycemic control for   <7.0% adults with diabetes     CBG: Recent Labs  Lab 10/30/19 0535 10/30/19 1135 10/30/19 1732 10/30/19 2343 10/31/19 0520  GLUCAP 136* 132* 100* 143* 119*    Critical care time: n/a       Roselie Awkward, MD [facility] Pager: [phone] Cell: [phone] If no response, call [phone]

## 2019-10-31 NOTE — Progress Notes (Signed)
PHARMACY - TOTAL PARENTERAL NUTRITION CONSULT NOTE   Indication: Prolonged ileus  Patient Measurements: Height: 5\' 11"  (180.3 cm) (per daughter) Weight: 93.6 kg (206 lb 5.6 oz) IBW/kg (Calculated) : 75.3   Body mass index is 28.78 kg/m. Usual Weight: 90.7 kg   Assessment:  - Pharmacy is consulted to start TPN in 86 yo male with prolonged ileus. Pt underwent exploratory laparotomy on 6/14 which revealed perforated duodenal ulcer.   Glucose / Insulin: hx of DM - on metformin PTA.  - CBGs < 150 with 60 units/bag insulin in TPN, rSSI q4h (range 100-143; goal 100-150) - 6 units of SS insulin required yesterday - Levemir 10 units BID per CCM started 6/18  Electrolytes: K (target >/= 4.0) is 4.3. Magnesium 2.0 (target >/= 2.0), CCa 9.8; others wnl Renal: 0.71,  BUN 25 mproved. 2.5L UOP last 24hr decreased LFTs / TGs: LFTs remain WNL, Tbili improved to WNL after procedure; TG are now WNL after stopping propofol. Previously elevated level likely spurious  Prealbumin / albumin: prealbumin 9.6 (6/28), 9.0 (6/21); albumin 1.6 (7/3) Intake / Output; MIVF: NS at 50 ml/hr; NG output 300 ml charted , UOP 2.5 L GI Imaging:  - 6/21 Upper GI series: small leak from superior margin of duodenal bulb. - 6/23 CT abdomen: 4.3 cm air-fluid collection in the right upper quadrant of the abdomen between the duodenal bulb and gallbladder, not containing enteric contrast. No residual leak from the duodenal bulb identified. Surgeries / Procedures:  - 6/14 Exploratory laparotomy which revealed perforated duodenal ulcer - 6/24 RUQ drain placed > 10 ml > cultured - 6/25 removal R abd JP drain   Central access:  PICC Line 6/15 TPN start date: 6/15  Nutritional Goals:   (per RD recommendation on 6/15): Kcal:  2200-2400 Protein:  115-125 grams Fluid:  >2L/d Goal TPN rate is 95 mL/hr (provides 121 g of protein and 2262 kcals per day)  Current Nutrition:  NPO   Plan:   Continue TPN at 95 mL/hr at 1800,  goal rate   Electrolytes in TPN: inc 85 mEq/L of Na, 85 mEq/L of K (below 40 meq/L) , 4 mEq/L of Ca, inc 15 mEq/L of Magnesium, and inc 15 mmol/L of Phos. Cl:Ac ratio max Ac  Standard MVI and trace elements to TPN  Continue resistant q6h SSI, and monitor Levemir and adjust as needed   Continue 60 units of regular insulin/day in TPN   D/C NS @ 50 ml/hr, Continue MIVF to KVO mL/hr at 1800  Monitor TPN labs on Mon/Thurs   Ulice Dash, PharmD, BCPS 10/31/2019 7:34 AM

## 2019-10-31 NOTE — Progress Notes (Signed)
[facility] Progress Note Patient Name: Matthew Schwartz DOB: [birth year] MRN: [id number]   Date of Service  10/31/2019  HPI/Events of Note  Phosphorus 2.2  eICU Interventions  ELINK electrolyte repletion protocol for Phosphorus ordered.        [name] Dotty Gonzalo 10/31/2019, 5:28 AM

## 2019-10-31 NOTE — Progress Notes (Signed)
19 Days Post-Op   Subjective/Chief Complaint: No specific complaint but tired of everything   Objective: Vital signs in last 24 hours: Temp:  [97.5 F (36.4 C)-98.5 F (36.9 C)] 97.6 F (36.4 C) (07/03 0300) Pulse Rate:  [43-86] 80 (07/03 0500) Resp:  [14-30] 14 (07/03 0500) BP: (107-164)/(50-113) 157/62 (07/03 0500) SpO2:  [93 %-99 %] 99 % (07/03 0500) Weight:  [93.6 kg] 93.6 kg (07/03 0400) Last BM Date: 10/30/19  Intake/Output from previous day: 07/02 0701 - 07/03 0700 In: 4597.9 [I.V.:3451.1; IV Piggyback:1146.9] Out: 2800 [Urine:2500; Emesis/NG output:300] Intake/Output this shift: No intake/output data recorded.  General appearance: alert and cooperative Resp: clear to auscultation bilaterally Cardio: regular rate and rhythm GI: soft, mild tenderness. minimal drain output  Lab Results:  Recent Labs    10/30/19 0441 10/31/19 0245  WBC 9.1 9.3  HGB 8.2* 9.1*  HCT 26.2* 29.3*  PLT 351 394   BMET Recent Labs    10/30/19 0441 10/31/19 0245  NA 138 135  K 3.9 4.3  CL 107 103  CO2 22 24  GLUCOSE 136* 138*  BUN 27* 25*  CREATININE 0.84 0.71  CALCIUM 7.4* 7.9*   PT/INR No results for input(s): LABPROT, INR in the last 72 hours. ABG Recent Labs    10/29/19 2230  PHART 7.481*  HCO3 24.8    Studies/Results: CT ABDOMEN PELVIS W CONTRAST  Result Date: 10/29/2019 CLINICAL DATA:  Possible abscess. EXAM: CT ABDOMEN AND PELVIS WITH CONTRAST TECHNIQUE: Multidetector CT imaging of the abdomen and pelvis was performed using the standard protocol following bolus administration of intravenous contrast. CONTRAST:  184mL OMNIPAQUE IOHEXOL 300 MG/ML  SOLN COMPARISON:  October 21, 2019. FINDINGS: Lower chest: Mild bilateral posterior basilar subsegmental atelectasis is noted. Hepatobiliary: No cholelithiasis is noted. Mild gallbladder distention is noted. Stable dilatation of common bile duct is noted. No focal abnormality is noted in the liver. Pancreas: Unremarkable. No  pancreatic ductal dilatation or surrounding inflammatory changes. Spleen: Normal in size without focal abnormality. Adrenals/Urinary Tract: Adrenal glands appear normal. Stable bilateral renal cysts are noted. No hydronephrosis or renal obstruction is noted. No renal or ureteral calculi are noted. Urinary bladder is unremarkable. Stomach/Bowel: Nasogastric tube tip is seen in proximal stomach. There is no evidence of bowel obstruction or inflammation. The appendix appears normal. Vascular/Lymphatic: Aortic atherosclerosis. No enlarged abdominal or pelvic lymph nodes. Reproductive: Stable postsurgical changes are noted in the prostate gland. Other: Anterior midline surgical incision is again noted. Large irregular high density is noted in this area most consistent with enlarging subcutaneous or intramuscular hematoma. There is been interval placement of pigtail drainage catheter in the area of fluid collection noted on prior exam. Fluid collection is significantly decompressed compared to prior exam. Musculoskeletal: No acute or significant osseous findings. IMPRESSION: 1. Anterior midline surgical incision is again noted. Large irregular high density is noted in this area most consistent with enlarging subcutaneous or intramuscular hematoma. 2. There has been interval placement of pigtail drainage catheter in the area of fluid collection noted on prior exam in the right upper quadrant. Fluid collection is significantly decompressed compared to prior exam. 3. Stable dilatation of common bile duct is noted. 4. Stable bilateral renal cysts are noted. Aortic Atherosclerosis (ICD10-I70.0). Electronically Signed   By: Marijo Conception M.D.   On: 10/29/2019 12:44   IR Sinus/Fist Tube Chk-Non GI  Result Date: 10/30/2019 INDICATION: 86 year old with history of repaired perforated duodenal ulcer. Patient continues to have a small amount of purulent output from  the right upper quadrant drain. EXAM: SINUS TRACT INJECTION /  FISTULOGRAM COMPARISON:  None. MEDICATIONS: None ANESTHESIA/SEDATION: None COMPLICATIONS: None immediate. TECHNIQUE: Patient was placed supine on the fluoroscopic table. Drain was injected with contrast. Subsequently, the drain was flushed with normal saline and attached to a gravity bag. PROCEDURE: Drain was injected with approximately 15 mL Omnipaque 300. FINDINGS: Pigtail catheter in the right upper abdomen. 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 Small irregular cavities around the drain but no large abscess cavity. 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 Drain was switched from a suction bulb to a gravity bag. Contrast fills small irregular cavities around the drain but no large abscess cavity. Electronically Signed   By: Markus Daft M.D.   On: 10/30/2019 17:20   DG Chest Port 1 View  Result Date: 10/29/2019 CLINICAL DATA:  Fever EXAM: PORTABLE CHEST 1 VIEW COMPARISON:  10/26/2019 FINDINGS: Areas of atelectasis in the mid lungs bilaterally. Heart is borderline in size. No effusions. No acute bony abnormality. NG tube and left PICC line remain in place, unchanged. IMPRESSION: Areas of atelectasis bilaterally.  Mild cardiomegaly. Electronically Signed   By: Rolm Baptise M.D.   On: 10/29/2019 19:41    Anti-infectives: Anti-infectives (From admission, onward)   Start     Dose/Rate Route Frequency Ordered Stop   10/29/19 2200  meropenem (MERREM) 1 g in sodium chloride 0.9 % 100 mL IVPB     Discontinue     1 g 200 mL/hr over 30 Minutes Intravenous Every 8 hours 10/29/19 2008     10/29/19 2200  vancomycin (VANCOCIN) IVPB 1000 mg/200 mL premix     Discontinue     1,000 mg 200 mL/hr over 60 Minutes Intravenous Every 12 hours 10/29/19 2008     10/25/19 1800  fluconazole (DIFLUCAN) IVPB 400 mg     Discontinue     400 mg 100 mL/hr over 120 Minutes Intravenous Every 24 hours 10/24/19 1750     10/24/19 1830  fluconazole (DIFLUCAN) IVPB 800 mg  Status:   Discontinued        800 mg 200 mL/hr over 120 Minutes Intravenous  Once 10/24/19 1750 10/24/19 1755   10/24/19 1830  fluconazole (DIFLUCAN) IVPB 800 mg        800 mg 100 mL/hr over 240 Minutes Intravenous Every 24 hours 10/24/19 1756 10/24/19 2240   10/12/19 2000  piperacillin-tazobactam (ZOSYN) IVPB 3.375 g  Status:  Discontinued        3.375 g 12.5 mL/hr over 240 Minutes Intravenous Every 8 hours 10/12/19 1911 10/29/19 1945   10/12/19 1708  ceFAZolin (ANCEF) 2-4 GM/100ML-% IVPB       Note to Pharmacy: Bennye Alm   : cabinet override      10/12/19 1708 10/12/19 1849      Assessment/Plan: s/p Procedure(s): EXPLORATORY LAPAROTOMY omental patch repair perforated duodenal ulcer (N/A) Continue ng and bowel rest  POD#19-Perforated Duodenal Ulcer S/p Exploratory laparotomy, omental patch repair of duodenal ulcer - Dr. [name] - 10/12/2019 Continue NG, NPO; allow ice chips IR drain - CT 10/29/2019 shows decrease in size of collection, drain in position IV Zosyn TNA Wound care - open with dressing changes- hematoma on CT of no consequence, small  Continue NPO, NG decompression, IR drain in place. Again discussed withdaughterat bedside this AM.Plan repeat UGI seriesnext Tuesdayto assess healing of perforated ulcer after [place]. If no leak, will remove NG and allow clear liquid diet. Continue perc  drainage and monitor quality and quantity of output.  Encouraged OOB with PT.  LOS: 19 days    Autumn Messing III 10/31/2019

## 2019-10-31 NOTE — Progress Notes (Deleted)
[facility] Progress Note Patient Name: Matthew Schwartz DOB: 1932-09-17 MRN: [id number]   Date of Service  10/31/2019  HPI/Events of Note  Patient needs a KUB to check NG tube placement, Pt needs an order for bilateral soft wrist restraints.  eICU Interventions  Orders entered for restraints and KUB.        [name] Matthew Schwartz 10/31/2019, 5:47 AM

## 2019-10-31 NOTE — Progress Notes (Signed)
PROGRESS NOTE   Matthew Schwartz  YKD:[id number] DOB: [birth year] DOA: 10/12/2019 PCP: [name], MD  Brief Narrative:  [name] NIDDM, HLD, GERD, irritable bowel syndrome (constipation) status post left knee replacement 2009, PTCA 2000-->RCA well as prior prostate CA, former smoker Admit 10/12/2019 severe abdominal pain-ex lap 10/12/2019-perforated duodenal ulcer and remained on vent NSVT postop on pressors and cardiology consulted-eventually palliative care consulted-extubated 6/17 Developed fevers 6/23-found to have postoperative abscess-IR consulted placed to drain 6/24 is growing Candida which is being treated currently with antifungals On 6/30 upper GI series were performed-and repeated Unfortunately spiked a fever without cause-found to have on CT 7/1 enlarging hematoma General surgery felt this was minimal and not the cause for his fever He has not had high-grade fever since his Antibiotics were adjusted to vancomycin and Primaxin but continues to have low-grade Heparin was held temporarily until 7/3 and restarted cautiously after risk-benefit discussion with family   Assessment & Plan:   Active Problems:   HTN (hypertension)   Malignant neoplasm of prostate (HCC)   Hyperlipidemia   Obstipation   HLD (hyperlipidemia)   Diabetes mellitus type 2, uncontrolled, with complications (HCC)   Perforated bowel ([place])   Perforated duodenal ulcer ([place])   Acute respiratory insufficiency   Elevated troponin level   Acute respiratory failure with hypoxemia (HCC)   PVCs (premature ventricular contractions)   Bilateral atelectasis   Acute abdominal pain   Palliative care by specialist   Goals of care, counseling/discussion   General weakness   1. Septic shock + peritonitis status post omental patch repair 6/14 a. UGI series 6/30 shows persisting small leak per general surgery-diet as per them-TPN continues as is n.p.o. b. Fluid collections noted 6/23-drain placement 6/24 =  Candida-Rx fluconazole at this time-IR plans drain study sometime next week c. He had a T-max of 102 on 7/1 prompting change in antibiotics to vancomycin and meropenem in addition to his antifungal medications d. We do not have a clear source- DDx atelectasis based on x-ray 7/3 e. I discussed personally with Dr. [name] on the pho-ne on 7/2 and he did not feel that the enlarging hematoma was of great significance or was of a source of infection as if it was infected this would drain into the midline wound that is present currently 2. Hypoxic respiratory failure extubated 6/17-NG tube in place with current atelectasis a. Seems euvolemic at this time would hold any diuretic b. Monitor trends 3. Acute systolic heart failure + NSTEMI, CAD 2000 a. Remains on beta-blocker and clonidine patch b. Home calcium channel blocker ACE nitrates are on hold because of n.p.o. state c. Cardiology input appreciated and they will follow peripherally 4. Left upper extremity DVT cephalic/axillary vein likely secondary to PICC line a. Heparin discontinued on 7/1 after enlarging hematoma b. Resumed heparin without bolus 7/3 after discussions with specialists 5. DM TY 2 A1c 6.6 a. Not a diabetic prior to admission and probably this is from his TPN b. Sliding scale coverage as needed-sugars 100-1 61 6. Anemia critical illness + transfusion 6/27 2 units PRBC a. Hemoglobin is stabilized in the 8-9 range after 2 units given on 6/27 b. Repeat labs in a.m. 7. Malignancy of prostate a. Outpatient screening follow-up and PSA 8. Former smoker   DVT prophylaxis:  Code Status: full Family Communication: Discussed with daughter bedside this morning in detail 7/2  Disposition:   Status is: Inpatient will be transferring to progressive floor from stepdown as he has been stable for a  relatively long period of time  Remains inpatient appropriate because:Ongoing diagnostic testing needed not appropriate for outpatient work  up   Dispo:  Patient From: [facility]  Planned Disposition: [facility]  Expected discharge date: 11/05/19  Medically stable for discharge: No    Consultants:   General surgery  Cardiology  Procedures: None  Antimicrobials: IV Zosyn transitioned to meropenem and vancomycin 7/1 (total antibiotics so far since 6/14) IV Diflucan since 6/26   Subjective: Stable currently resting quietly Daughter at bedside reports was more verbal yesterday and coherent and seemed to be closer to his normal No chest pain   Objective: Vitals:   10/31/19 1300 10/31/19 1400 10/31/19 1500 10/31/19 1600  BP: (!) 152/67 (!) 153/60 (!) 149/62 140/61  Pulse: 100 92 87 88  Resp: (!) 25 (!) 27 (!) 28 (!) 24  Temp:      TempSrc:      SpO2: 96% 94% 96% 95%  Weight:      Height:        Intake/Output Summary (Last 24 hours) at 10/31/2019 1655 Last data filed at 10/31/2019 1600 Gross per 24 hour  Intake 5059.19 ml  Output 3700 ml  Net 1359.19 ml   Filed Weights   10/28/19 0500 10/30/19 0441 10/31/19 0400  Weight: 88.1 kg 86.8 kg 93.6 kg    Examination:  General exam: Awake alert coherent but sleepy at times Respiratory system:no rales no rhonchi-cannot appreciate rhonchi or rales Cardiovascular system: S1-S2 no murmur Gastrointestinal system: Bandage not disturbed nontender Central nervous system: Intact no focal deficit Extremities: Well perfused lower extremities no swelling Skin: As above Psychiatry: Quiet affect  Data Reviewed: I have personally reviewed following labs and imaging studies Phosphorus 2.2 and replaced BUN/creatinine 25/0.7 Hemoglobin 9.1 White count 9 Chest x-ray personally reviewed atelectasis   Radiology Studies: DG Chest 2 View  Result Date: 10/31/2019 CLINICAL DATA:  Pneumonia EXAM: CHEST - 2 VIEW COMPARISON:  Chest radiograph dated 10/29/2019 FINDINGS: A left upper extremity peripherally inserted central venous catheter tip overlies  the superior cavoatrial junction, unchanged. An enteric tube enters the stomach and terminates below the field of view. A pigtail catheter overlies the right upper quadrant, unchanged. The heart size and mediastinal contours are within normal limits. Mild atelectasis in both mid lungs is not significantly changed. There is no pleural effusion or pneumothorax. The visualized skeletal structures are unremarkable. IMPRESSION: Unchanged mild bilateral mid lung atelectasis. Electronically Signed   By: Zerita Boers M.D.   On: 10/31/2019 10:18   IR Sinus/Fist Tube Chk-Non GI  Result Date: 10/30/2019 INDICATION: 86 year old with history of repaired perforated duodenal ulcer. Patient continues to have a small amount of purulent output from the right upper quadrant drain. EXAM: SINUS TRACT INJECTION / FISTULOGRAM COMPARISON:  None. MEDICATIONS: None ANESTHESIA/SEDATION: None COMPLICATIONS: None immediate. TECHNIQUE: Patient was placed supine on the fluoroscopic table. Drain was injected with contrast. Subsequently, the drain was flushed with normal saline and attached to a gravity bag. PROCEDURE: Drain was injected with approximately 15 mL Omnipaque 300. FINDINGS: Pigtail catheter in the right upper abdomen. 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 Small irregular cavities around the drain but no large abscess cavity. 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 Drain was switched from a suction bulb to a gravity bag. Contrast fills small irregular cavities around the drain but no large abscess cavity. Electronically Signed   By: Markus Daft M.D.   On: 10/30/2019 17:20  DG Chest Port 1 View  Result Date: 10/29/2019 CLINICAL DATA:  Fever EXAM: PORTABLE CHEST 1 VIEW COMPARISON:  10/26/2019 FINDINGS: Areas of atelectasis in the mid lungs bilaterally. Heart is borderline in size. No effusions. No acute bony abnormality. NG tube and left PICC line remain in place, unchanged.  IMPRESSION: Areas of atelectasis bilaterally.  Mild cardiomegaly. Electronically Signed   By: Rolm Baptise M.D.   On: 10/29/2019 19:41     Scheduled Meds: . Chlorhexidine Gluconate Cloth  6 each Topical Daily  . cloNIDine  0.2 mg Transdermal Weekly  . insulin aspart  0-20 Units Subcutaneous Q6H  . insulin detemir  10 Units Subcutaneous BID  . lidocaine  1 patch Transdermal Q24H  . mouth rinse  15 mL Mouth Rinse BID  . metoprolol tartrate  5 mg Intravenous Q4H  . nitroGLYCERIN  1 inch Topical Q6H  . pantoprazole (PROTONIX) IV  40 mg Intravenous Q12H  . sodium chloride flush  10-40 mL Intracatheter Q12H  . sodium chloride flush  5 mL Intracatheter Q8H   Continuous Infusions: . sodium chloride Stopped (10/30/19 1747)  . sodium chloride 10 mL/hr at 10/31/19 1600  . fluconazole (DIFLUCAN) IV Stopped (10/30/19 2013)  . heparin 1,100 Units/hr (10/31/19 1649)  . meropenem (MERREM) IV Stopped (10/31/19 1438)  . TPN ADULT (ION) 95 mL/hr at 10/31/19 1600  . TPN ADULT (ION)    . vancomycin       LOS: 19 days    Time spent: [place], MD [facility] To contact the attending provider between 7A-7P or the covering provider during after hours 7P-7A, please log into the web site [email or web address] and access using universal [place] password for that web site. If you do not have the password, please call the hospital operator.  10/31/2019, 4:55 PM

## 2019-10-31 NOTE — Progress Notes (Signed)
[place] for Heparin Indication: chest pain/ACS, left upper extremity DVT  Allergies  Allergen Reactions  . Losartan Potassium Other (See Comments)    Unknown reaction that happened 15 years ago   Patient Measurements: Height: 5\' 11"  (180.3 cm) (per daughter) Weight: 93.6 kg (206 lb 5.6 oz) IBW/kg (Calculated) : 75.3 Heparin Dosing Weight: 94 kg  Vital Signs: Temp: 97.6 F (36.4 C) (07/03 1200) Temp Source: Oral (07/03 1200) BP: 140/61 (07/03 1600) Pulse Rate: 88 (07/03 1600)  Labs: Recent Labs    10/28/19 2129 10/29/19 0345 10/29/19 0345 10/29/19 2100 10/29/19 2100 10/29/19 2257 10/30/19 0441 10/31/19 0245  HGB  --  8.9*   < > 8.9*   < >  --  8.2* 9.1*  HCT  --  28.1*   < > 28.2*  --   --  26.2* 29.3*  PLT  --  407*   < > 396  --   --  351 394  HEPARINUNFRC 0.38 0.36  --   --   --   --   --   --   CREATININE  --  0.90   < >  --   --  0.88 0.84 0.71   < > = values in this interval not displayed.   Estimated Creatinine Clearance: 80.3 mL/min (by C-G formula based on SCr of 0.71 mg/dL).  Medications:  Infusions:  . sodium chloride Stopped (10/30/19 1747)  . sodium chloride 10 mL/hr at 10/31/19 1600  . fluconazole (DIFLUCAN) IV Stopped (10/30/19 2013)  . meropenem (MERREM) IV Stopped (10/31/19 1438)  . TPN ADULT (ION) 95 mL/hr at 10/31/19 1600  . TPN ADULT (ION)    . vancomycin     Assessment: 86 yo male with perforated duodenal ulcer s/p omental patch repair.  Pharmacy consulted to dose IV heparin for NSTEMI as well as for acute deep vein thrombosis involving the left axillary vein, surrounding the PICC line. Findings consistent with acute superficial vein thrombosis involving the left cephalic vein. Patient with anemia of critical illenss/post-op s/p transfusion 6/27.   Significant events: 6/27: hold for Hgb 6.2 6/28: bloody stools, small amount of bleeding from surgical incision and JP drain  6/29: resume heparin 7/1:  d/c heparin 2/2 hematoma at surgical site - of no consequence per surgery 7/2: d/w MD - continue to hold, reconsider resuming in next several days 7/3: resume heparin  Today, 10/31/2019  Hgb low stable, platelets stable wnl  Transfused 6/27  No reported problems or bleeding  Goal of Therapy:  Heparin level 0.3-0.5 units/ml Monitor platelets by anticoagulation protocol: Yes   Plan:   Resume IV heparin at 1100 units/hr  Recheck heparin level 8 hours after start  Daily heparin level and CBC  Continue to monitor for signs/symptoms of bleeding  Reuel Boom, PharmD, BCPS [phone] 10/31/2019, 4:13 PM

## 2019-10-31 NOTE — Progress Notes (Signed)
Referring Physician(s): Clent Demark  Supervising Physician: Daryll Brod  Patient Status:  [facility] - In-pt  Chief Complaint: Abdominal pain/fluid collection   Subjective: Patient resting quietly, eyes closed, no acute changes, daughter in room  Allergies: Losartan potassium  Medications: Prior to Admission medications   Medication Sig Start Date End Date Taking? Authorizing Provider  alfuzosin (UROXATRAL) 10 MG 24 hr tablet Take 10 mg by mouth daily. 09/07/19  Yes [provider]  amLODipine (NORVASC) 5 MG tablet Take 5 mg by mouth daily. 07/13/19  Yes [provider]  aspirin 81 MG tablet Take 81 mg by mouth every morning.    Yes [provider]  atorvastatin (LIPITOR) 40 MG tablet Take 40 mg by mouth at bedtime.    Yes [provider]  dicyclomine (BENTYL) 20 MG tablet Take 1 tablet (20 mg total) by mouth 2 (two) times daily. 10/11/19  Yes Nuala Alpha A, PA-C  famotidine (PEPCID) 20 MG tablet Take 20 mg by mouth 2 (two) times daily.   Yes [provider]  hydrocortisone valerate cream (WESTCORT) 0.2 % Apply 1 application topically 2 (two) times daily.  09/20/19  Yes [provider]  isosorbide mononitrate (IMDUR) 60 MG 24 hr tablet Take 60 mg by mouth every evening.   Yes [provider]  lactulose (CHRONULAC) 10 GM/15ML solution Take 10 g by mouth as needed for mild constipation.   Yes [provider]  metFORMIN (GLUCOPHAGE) 500 MG tablet Take 500 mg by mouth 2 (two) times daily. 09/20/19  Yes [provider]  metoprolol succinate (TOPROL-XL) 50 MG 24 hr tablet Take 50 mg by mouth 2 (two) times daily. 04/07/14  Yes [provider]  potassium chloride (KLOR-CON) 10 MEQ tablet Take 2 tablets (20 mEq total) by mouth daily for 3 days. 10/11/19 10/14/19 Yes Nuala Alpha A, PA-C  PROCTOSOL HC 2.5 % rectal cream Place 1 application rectally daily as needed for hemorrhoids or itching.  11/06/13  Yes  [provider]  ramipril (ALTACE) 10 MG capsule Take 10 mg by mouth 2 (two) times daily.    Yes [provider]  RESTASIS 0.05 % ophthalmic emulsion Place 1 drop into both eyes 2 (two) times daily.  09/09/19  Yes [provider]  senna-docusate (SENOKOT-S) 8.6-50 MG tablet Take 1 tablet by mouth daily. 10/09/19  [place]  [id number] [facility] injection Inject as directed once. 04/10/17   [provider]     Vital Signs: BP (!) 157/62   Pulse 80   Temp 99.6 F (37.6 C) (Oral)   Resp 14   Ht 5\' 11"  (1.803 m) Comment: per daughter  Wt 206 lb 5.6 oz (93.6 kg)   SpO2 99%   BMI 28.78 kg/m   Physical Exam currently asleep, NG tube in place, right upper quadrant drain intact, insertion site okay, no significant output noted.  Drain flushed with minimal return.  Imaging: DG Chest 2 View  Result Date: 10/31/2019 CLINICAL DATA:  Pneumonia EXAM: CHEST - 2 VIEW COMPARISON:  Chest radiograph dated 10/29/2019 FINDINGS: A left upper extremity peripherally inserted central venous catheter tip overlies the superior cavoatrial junction, unchanged. An enteric tube enters the stomach and terminates below the field of view. A pigtail catheter overlies the right upper quadrant, unchanged. The heart size and mediastinal contours are within normal limits. Mild atelectasis in both mid lungs is not significantly changed. There is no pleural effusion or pneumothorax. The visualized skeletal structures are unremarkable. IMPRESSION: Unchanged  mild bilateral mid lung atelectasis. Electronically Signed   By: Zerita Boers M.D.   On: 10/31/2019 10:18   CT ABDOMEN PELVIS W CONTRAST  Result Date: 10/29/2019 CLINICAL DATA:  Possible abscess. EXAM: CT ABDOMEN AND PELVIS WITH CONTRAST TECHNIQUE: Multidetector CT imaging of the abdomen and pelvis was performed using the standard protocol following bolus administration of intravenous contrast. CONTRAST:  145mL OMNIPAQUE IOHEXOL 300 MG/ML   SOLN COMPARISON:  October 21, 2019. FINDINGS: Lower chest: Mild bilateral posterior basilar subsegmental atelectasis is noted. Hepatobiliary: No cholelithiasis is noted. Mild gallbladder distention is noted. Stable dilatation of common bile duct is noted. No focal abnormality is noted in the liver. Pancreas: Unremarkable. No pancreatic ductal dilatation or surrounding inflammatory changes. Spleen: Normal in size without focal abnormality. Adrenals/Urinary Tract: Adrenal glands appear normal. Stable bilateral renal cysts are noted. No hydronephrosis or renal obstruction is noted. No renal or ureteral calculi are noted. Urinary bladder is unremarkable. Stomach/Bowel: Nasogastric tube tip is seen in proximal stomach. There is no evidence of bowel obstruction or inflammation. The appendix appears normal. Vascular/Lymphatic: Aortic atherosclerosis. No enlarged abdominal or pelvic lymph nodes. Reproductive: Stable postsurgical changes are noted in the prostate gland. Other: Anterior midline surgical incision is again noted. Large irregular high density is noted in this area most consistent with enlarging subcutaneous or intramuscular hematoma. There is been interval placement of pigtail drainage catheter in the area of fluid collection noted on prior exam. Fluid collection is significantly decompressed compared to prior exam. Musculoskeletal: No acute or significant osseous findings. IMPRESSION: 1. Anterior midline surgical incision is again noted. Large irregular high density is noted in this area most consistent with enlarging subcutaneous or intramuscular hematoma. 2. There has been interval placement of pigtail drainage catheter in the area of fluid collection noted on prior exam in the right upper quadrant. Fluid collection is significantly decompressed compared to prior exam. 3. Stable dilatation of common bile duct is noted. 4. Stable bilateral renal cysts are noted. Aortic Atherosclerosis (ICD10-I70.0).  Electronically Signed   By: Marijo Conception M.D.   On: 10/29/2019 12:44   IR Sinus/Fist Tube Chk-Non GI  Result Date: 10/30/2019 INDICATION: 86 year old with history of repaired perforated duodenal ulcer. Patient continues to have a small amount of purulent output from the right upper quadrant drain. EXAM: SINUS TRACT INJECTION / FISTULOGRAM COMPARISON:  None. MEDICATIONS: None ANESTHESIA/SEDATION: None COMPLICATIONS: None immediate. TECHNIQUE: Patient was placed supine on the fluoroscopic table. Drain was injected with contrast. Subsequently, the drain was flushed with normal saline and attached to a gravity bag. PROCEDURE: Drain was injected with approximately 15 mL Omnipaque 300. FINDINGS: Pigtail catheter in the right upper abdomen. 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 Small irregular cavities around the drain but no large abscess cavity. 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 Drain was switched from a suction bulb to a gravity bag. Contrast fills small irregular cavities around the drain but no large abscess cavity. Electronically Signed   By: Markus Daft M.D.   On: 10/30/2019 17:20   DG Chest Port 1 View  Result Date: 10/29/2019 CLINICAL DATA:  Fever EXAM: PORTABLE CHEST 1 VIEW COMPARISON:  10/26/2019 FINDINGS: Areas of atelectasis in the mid lungs bilaterally. Heart is borderline in size. No effusions. No acute bony abnormality. NG tube and left PICC line remain in place, unchanged. IMPRESSION: Areas of atelectasis bilaterally.  Mild cardiomegaly. Electronically Signed   By: Rolm Baptise M.D.   On: 10/29/2019  19:41   DG UGI W SINGLE CM (SOL OR THIN BA)  Result Date: 10/28/2019 CLINICAL DATA:  Post omental patch repair of duodenal ulcer 10/12/2019. Proximal duodenal upper GI which leak on 10/21/2019 EXAM: UPPER GI SERIES WITH KUB TECHNIQUE: After obtaining a scout radiograph a routine upper GI series was performed using water-soluble contrast  FLUOROSCOPY TIME:  Fluoroscopy Time:  2.9 Radiation Exposure Index (if provided by the fluoroscopic device): 158.9 mGy Number of Acquired Spot Images: 13 COMPARISON:  Upper GI 10/21/2019 FINDINGS: Initial KUB demonstrates pigtail catheter in the RIGHT upper quadrant. Water-soluble contrast (200 mL) was hand injected in stomach through the NG tube. Contrast readily flowed through the distal stomach into the duodenum. A small stump of a leak is noted at the level of the first portion the duodenum. This is the site of the more prominent elongated leak demonstrated on prior GI 10/21/2019. The small extraluminal collection measures 4 mm x 2 mm. Previously measured 26 mm by 1 mm. No communication with the pigtail catheter in the RIGHT upper quadrant in the same vicinity. 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 Findings suggest resolving but persistent extraluminal collection. Findings conveyed toWill Lucan, [place] on 10/28/2019  at12:30. Electronically Signed   By: Suzy Bouchard M.D.   On: 10/28/2019 12:30    Labs:  CBC: Recent Labs    10/29/19 0345 10/29/19 2100 10/30/19 0441 10/31/19 0245  WBC 9.3 10.0 9.1 9.3  HGB 8.9* 8.9* 8.2* 9.1*  HCT 28.1* 28.2* 26.2* 29.3*  PLT 407* 396 351 394    COAGS: No results for input(s): INR, APTT in the last 8760 hours.  BMP: Recent Labs    10/29/19 0345 10/29/19 2257 10/30/19 0441 10/31/19 0245  NA 139 138 138 135  K 4.6 4.2 3.9 4.3  CL 105 107 107 103  CO2 24 27 22 24   GLUCOSE 126* 148* 136* 138*  BUN 26* 27* 27* 25*  CALCIUM 8.1* 7.7* 7.4* 7.9*  CREATININE 0.90 0.88 0.84 0.71  GFRNONAA >60 >60 >60 >60  GFRAA >60 >60 >60 >60    LIVER FUNCTION TESTS: Recent Labs    10/26/19 0320 10/27/19 0508 10/29/19 0345 10/31/19 0245  BILITOT 1.0 0.7 0.7 0.8  AST 29 27 23 16   ALT 37 35 27 20  ALKPHOS 56 62 61 56  PROT 6.6  6.3* 6.8 7.0  ALBUMIN 1.8* 1.7* 1.7* 1.6*    Assessment and Plan: Patient with history of repair of perforated duodenal ulcer and post op right upper quadrant fluid collection, status post drain placement on 6/24; temp 99.6, WBC normal, hemoglobin stable, creatinine normal, blood cultures negative to date; drain injection study yesterday revealed 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 Rowe Robert, PA-C 10/31/2019, 10:50 AM   I spent a total of 15 minutes at the the patient's bedside AND on the patient's hospital floor or unit, greater than 50% of which was counseling/coordinating care for right abdominal fluid collection drain    Patient ID: Matthew Schwartz, male   DOB: 05-04-1932, 86 y.o.   MRN: [id number]

## 2019-10-31 NOTE — Progress Notes (Signed)
Pharmacy Antibiotic Note  Matthew Schwartz is a 86 y.o. male admitted on 10/12/2019 with intra-abdominal infection. Pharmacy has been consulted for vancomycin, meropenem, and fluconazole dosing.  10/31/19 3:56 PM - WBC WNL - Afebrile since 7/2 - SCr stable WNL - Vanc trough slightly below goal at 13 mcg/mL  Plan: Continue fluconazole 400mg  q24h  Continue meropenem 1 g iv q8h  Increase vancomycin to 1250 mg IV q12 hr (targeting trough 15-20 mcg/mL) Will discuss planned duration of vancomycin with CCM as culture negative so far  Follow renal function, final culture results, clinical course  Height: 5\' 11"  (180.3 cm) (per daughter) Weight: 93.6 kg (206 lb 5.6 oz) IBW/kg (Calculated) : 75.3  Temp (24hrs), Avg:98.3 F (36.8 C), Min:97.6 F (36.4 C), Max:99.6 F (37.6 C)  Recent Labs  Lab 10/27/19 0508 10/28/19 0433 10/28/19 0759 10/29/19 0345 10/29/19 2100 10/29/19 2257 10/30/19 0441 10/31/19 0245 10/31/19 0930  WBC  --  8.7  --  9.3 10.0  --  9.1 9.3  --   CREATININE   < >  --  0.98 0.90  --  0.88 0.84 0.71  --   LATICACIDVEN  --   --   --   --  0.8 0.8  --   --   --   VANCOTROUGH  --   --   --   --   --   --   --   --  13*   < > = values in this interval not displayed.    Estimated Creatinine Clearance: 80.3 mL/min (by C-G formula based on SCr of 0.71 mg/dL).    Allergies  Allergen Reactions  . Losartan Potassium Other (See Comments)    Unknown reaction that happened 15 years ago   Antimicrobials this admission:  6/15 Zosyn >> 7/1 6/26 fluconazole >> 7/7 meropenem >>  7/1 vancomycin >>   Microbiology results:  6/14 UCx:  <10k insignificant growth  6/14 MRSA PCR: negative 6/24 AbscessCx: mod Candida albicans 6/25 BCx: NGF 7/1 BCx: NGTD   Thank you for allowing pharmacy to be a part of this patient's care.  Reuel Boom, PharmD, BCPS [phone] 10/31/2019, 4:01 PM

## 2019-10-31 NOTE — Progress Notes (Deleted)
eLink Physician-Brief Progress Note Patient Name: Matthew Schwartz DOB: November 13, 1932 MRN: [id number]   Date of Service  10/31/2019  HPI/Events of Note  Pt needs an order for soft restraints on the ventilator, he also needs a KUB to verify NG tube placement.  eICU Interventions  Orders for soft restraints and KUB placed.        [name] Alysabeth Scalia 10/31/2019, 5:37 AM

## 2019-11-01 LAB — CBC WITH DIFFERENTIAL/PLATELET
Abs Immature Granulocytes: 0.1 10*3/uL — ABNORMAL HIGH (ref 0.00–0.07)
Basophils Absolute: 0 10*3/uL (ref 0.0–0.1)
Basophils Relative: 0 %
Eosinophils Absolute: 0.4 10*3/uL (ref 0.0–0.5)
Eosinophils Relative: 4 %
HCT: 27.4 % — ABNORMAL LOW (ref 39.0–52.0)
Hemoglobin: 8.7 g/dL — ABNORMAL LOW (ref 13.0–17.0)
Immature Granulocytes: 1 %
Lymphocytes Relative: 13 %
Lymphs Abs: 1.3 10*3/uL (ref 0.7–4.0)
MCH: 29 pg (ref 26.0–34.0)
MCHC: 31.8 g/dL (ref 30.0–36.0)
MCV: 91.3 fL (ref 80.0–100.0)
Monocytes Absolute: 0.9 10*3/uL (ref 0.1–1.0)
Monocytes Relative: 9 %
Neutro Abs: 7.6 10*3/uL (ref 1.7–7.7)
Neutrophils Relative %: 73 %
Platelets: 351 10*3/uL (ref 150–400)
RBC: 3 MIL/uL — ABNORMAL LOW (ref 4.22–5.81)
RDW: 18.5 % — ABNORMAL HIGH (ref 11.5–15.5)
WBC: 10.3 10*3/uL (ref 4.0–10.5)
nRBC: 0 % (ref 0.0–0.2)

## 2019-11-01 LAB — COMPREHENSIVE METABOLIC PANEL
ALT: 26 U/L (ref 0–44)
AST: 27 U/L (ref 15–41)
Albumin: 1.6 g/dL — ABNORMAL LOW (ref 3.5–5.0)
Alkaline Phosphatase: 64 U/L (ref 38–126)
Anion gap: 7 (ref 5–15)
BUN: 24 mg/dL — ABNORMAL HIGH (ref 8–23)
CO2: 24 mmol/L (ref 22–32)
Calcium: 7.8 mg/dL — ABNORMAL LOW (ref 8.9–10.3)
Chloride: 105 mmol/L (ref 98–111)
Creatinine, Ser: 0.74 mg/dL (ref 0.61–1.24)
GFR calc Af Amer: >60 mL/min (ref >60)
GFR calc non Af Amer: >60 mL/min (ref >60)
Glucose, Bld: 123 mg/dL — ABNORMAL HIGH (ref 70–99)
Potassium: 3.7 mmol/L (ref 3.5–5.1)
Sodium: 136 mmol/L (ref 135–145)
Total Bilirubin: 0.7 mg/dL (ref 0.3–1.2)
Total Protein: 6.9 g/dL (ref 6.5–8.1)

## 2019-11-01 LAB — MAGNESIUM: Magnesium: 2.2 mg/dL (ref 1.7–2.4)

## 2019-11-01 LAB — HEPARIN LEVEL (UNFRACTIONATED)
Heparin Unfractionated: 1.01 IU/mL — ABNORMAL HIGH (ref 0.30–0.70)
Heparin Unfractionated: <0.1 IU/mL — ABNORMAL LOW (ref 0.30–0.70)

## 2019-11-01 LAB — PHOSPHORUS: Phosphorus: 2.5 mg/dL (ref 2.5–4.6)

## 2019-11-01 LAB — GLUCOSE, CAPILLARY
Glucose-Capillary: 122 mg/dL — ABNORMAL HIGH (ref 70–99)
Glucose-Capillary: 111 mg/dL — ABNORMAL HIGH (ref 70–99)
Glucose-Capillary: 124 mg/dL — ABNORMAL HIGH (ref 70–99)

## 2019-11-01 MED ORDER — TRAVASOL 10 % IV SOLN
INTRAVENOUS | Status: AC
  Filled 2019-11-01: qty 1208.4

## 2019-11-01 MED ORDER — HEPARIN (PORCINE) 25000 UT/250ML-% IV SOLN
1200.0000 [IU]/h | INTRAVENOUS | Status: DC
  Administered 2019-11-01 (×2): 900 [IU]/h via INTRAVENOUS
  Administered 2019-11-02: 1200 [IU]/h via INTRAVENOUS
  Filled 2019-11-01 (×2): qty 250

## 2019-11-01 MED ORDER — POTASSIUM PHOSPHATES 15 MMOLE/5ML IV SOLN
10.0000 mmol | Freq: Once | INTRAVENOUS | Status: AC
  Administered 2019-11-01: 10 mmol via INTRAVENOUS
  Filled 2019-11-01: qty 3.33

## 2019-11-01 NOTE — Progress Notes (Signed)
PHARMACY - TOTAL PARENTERAL NUTRITION CONSULT NOTE   Indication: Prolonged ileus  Patient Measurements: Height: 5\' 11"  (180.3 cm) Weight: 93.6 kg (206 lb 5.6 oz) IBW/kg (Calculated) : 75.3 TPN AdjBW (KG): 93.6 Body mass index is 28.78 kg/m. Usual Weight: 90.7 kg   Assessment:  - Pharmacy is consulted to start TPN in 86 yo male with prolonged ileus. Pt underwent exploratory laparotomy on 6/14 which revealed perforated duodenal ulcer.   Glucose / Insulin: hx of DM - on metformin PTA.  - CBGs < 150 with 60 units/bag insulin in TPN, rSSI q4h (range 122-161; goal 100-150) - 13 units of SS insulin required yesterday - Levemir 10 units BID per CCM started 6/18  Electrolytes: K (target >/= 4.0) is 3.7. Magnesium 2.2 (target >/= 2.0), CCa 9.7; others wnl Renal: 0.74,  BUN 24 mproved. 2.5L UOP last 24hr decreased LFTs / TGs: LFTs remain WNL, Tbili improved to WNL after procedure; TG are now WNL after stopping propofol. Previously elevated level likely spurious  Prealbumin / albumin: prealbumin 9.6 (6/28), 9.0 (6/21); albumin 1.6 (7/3) Intake / Output; MIVF: NS at 50 ml/hr; NG output 300 ml charted , UOP 2.1 L GI Imaging:  - 6/21 Upper GI series: small leak from superior margin of duodenal bulb. - 6/23 CT abdomen: 4.3 cm air-fluid collection in the right upper quadrant of the abdomen between the duodenal bulb and gallbladder, not containing enteric contrast. No residual leak from the duodenal bulb identified. Surgeries / Procedures:  - 6/14 Exploratory laparotomy which revealed perforated duodenal ulcer - 6/24 RUQ drain placed > 10 ml > cultured - 6/25 removal R abd JP drain   Central access:  PICC Line 6/15 TPN start date: 6/15  Nutritional Goals:   (per RD recommendation on 6/15): Kcal:  2200-2400 Protein:  115-125 grams Fluid:  >2L/d Goal TPN rate is 95 mL/hr (provides 121 g of protein and 2262 kcals per day)  Current Nutrition:  NPO   Plan:   Continue TPN at 95 mL/hr at  1800, goal rate   Electrolytes in TPN: inc 85 mEq/L of Na, 85 mEq/L of K (below 40 meq/L) , 4 mEq/L of Ca,15 mEq/L of Magnesium, and 15 mmol/L of Phos. Cl:Ac ratio max Ac  Standard MVI and trace elements to TPN  Continue resistant q6h SSI, and monitor Levemir and adjust as needed   Continue 60 units of regular insulin/day in TPN   Continue MIVF to KVO mL/hr at 1800  Monitor TPN labs on Mon/Thurs   Ulice Dash, PharmD, BCPS 11/01/2019 7:27 AM

## 2019-11-01 NOTE — Progress Notes (Signed)
Pharmacy - IV heparin  Assessment:    Please see note from Ulice Dash, PharmD earlier today for full details.  Briefly, 86 y.o. male on IV heparin for LUE DVT   Most recent heparin level < 0.10 on 900 units/hr  Prior level at 0000 today was >1.0 on 1100 units/hr  Night RN believes 0000 level was drawn from PICC (through which heparin was infusing at the time), indicating that the most recent level is accurate and the midnight level was in fact the spurious level - this seems reasonable as patient was therapeutic and stable on 1100 units/hr for several days prior to holding heparin  Plan:   Increase heparin to 1100 units/hr  Check heparin level 8 hrs after rate change  Reuel Boom, PharmD, BCPS [phone] 11/01/2019, 7:11 PM

## 2019-11-01 NOTE — Progress Notes (Signed)
20 Days Post-Op   Subjective/Chief Complaint: No complaints   Objective: Vital signs in last 24 hours: Temp:  [97.6 F (36.4 C)-99.6 F (37.6 C)] 98.6 F (37 C) (07/04 0400) Pulse Rate:  [80-101] 82 (07/04 0700) Resp:  [23-32] 27 (07/04 0700) BP: (140-169)/(57-73) 157/71 (07/04 0700) SpO2:  [94 %-99 %] 99 % (07/04 0700) Last BM Date: 10/31/19  Intake/Output from previous day: 07/03 0701 - 07/04 0700 In: 2304.1 [I.V.:1597; IV Piggyback:707.1] Out: 2250 [Urine:2100; Emesis/NG output:150] Intake/Output this shift: No intake/output data recorded.  General appearance: alert and cooperative Resp: clear to auscultation bilaterally Cardio: regular rate and rhythm GI: soft, nontender. minimal output from drain  Lab Results:  Recent Labs    10/31/19 0245 11/01/19 0500  WBC 9.3 10.3  HGB 9.1* 8.7*  HCT 29.3* 27.4*  PLT 394 351   BMET Recent Labs    10/31/19 0245 11/01/19 0645  NA 135 136  K 4.3 3.7  CL 103 105  CO2 24 24  GLUCOSE 138* 123*  BUN 25* 24*  CREATININE 0.71 0.74  CALCIUM 7.9* 7.8*   PT/INR No results for input(s): LABPROT, INR in the last 72 hours. ABG Recent Labs    10/29/19 2230  PHART 7.481*  HCO3 24.8    Studies/Results: DG Chest 2 View  Result Date: 10/31/2019 CLINICAL DATA:  Pneumonia EXAM: CHEST - 2 VIEW COMPARISON:  Chest radiograph dated 10/29/2019 FINDINGS: A left upper extremity peripherally inserted central venous catheter tip overlies the superior cavoatrial junction, unchanged. An enteric tube enters the stomach and terminates below the field of view. A pigtail catheter overlies the right upper quadrant, unchanged. The heart size and mediastinal contours are within normal limits. Mild atelectasis in both mid lungs is not significantly changed. There is no pleural effusion or pneumothorax. The visualized skeletal structures are unremarkable. IMPRESSION: Unchanged mild bilateral mid lung atelectasis. Electronically Signed   By: Zerita Boers M.D.   On: 10/31/2019 10:18   IR Sinus/Fist Tube Chk-Non GI  Result Date: 10/30/2019 INDICATION: 86 year old with history of repaired perforated duodenal ulcer. Patient continues to have a small amount of purulent output from the right upper quadrant drain. EXAM: SINUS TRACT INJECTION / FISTULOGRAM COMPARISON:  None. MEDICATIONS: None ANESTHESIA/SEDATION: None COMPLICATIONS: None immediate. TECHNIQUE: Patient was placed supine on the fluoroscopic table. Drain was injected with contrast. Subsequently, the drain was flushed with normal saline and attached to a gravity bag. PROCEDURE: Drain was injected with approximately 15 mL Omnipaque 300. FINDINGS: Pigtail catheter in the right upper abdomen. 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 Small irregular cavities around the drain but no large abscess cavity. 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 Drain was switched from a suction bulb to a gravity bag. Contrast fills small irregular cavities around the drain but no large abscess cavity. Electronically Signed   By: Markus Daft M.D.   On: 10/30/2019 17:20    Anti-infectives: Anti-infectives (From admission, onward)   Start     Dose/Rate Route Frequency Ordered Stop   10/31/19 2200  vancomycin (VANCOREADY) IVPB 1250 mg/250 mL     Discontinue     1,250 mg 166.7 mL/hr over 90 Minutes Intravenous Every 12 hours 10/31/19 1602     10/29/19 2200  meropenem (MERREM) 1 g in sodium chloride 0.9 % 100 mL IVPB     Discontinue     1 g 200 mL/hr over 30 Minutes Intravenous Every 8 hours 10/29/19 2008  10/29/19 2200  vancomycin (VANCOCIN) IVPB 1000 mg/200 mL premix  Status:  Discontinued        1,000 mg 200 mL/hr over 60 Minutes Intravenous Every 12 hours 10/29/19 2008 10/31/19 1602   10/25/19 1800  fluconazole (DIFLUCAN) IVPB 400 mg     Discontinue     400 mg 100 mL/hr over 120 Minutes Intravenous Every 24 hours 10/24/19 1750     10/24/19 1830  fluconazole  (DIFLUCAN) IVPB 800 mg  Status:  Discontinued        800 mg 200 mL/hr over 120 Minutes Intravenous  Once 10/24/19 1750 10/24/19 1755   10/24/19 1830  fluconazole (DIFLUCAN) IVPB 800 mg        800 mg 100 mL/hr over 240 Minutes Intravenous Every 24 hours 10/24/19 1756 10/24/19 2240   10/12/19 2000  piperacillin-tazobactam (ZOSYN) IVPB 3.375 g  Status:  Discontinued        3.375 g 12.5 mL/hr over 240 Minutes Intravenous Every 8 hours 10/12/19 1911 10/29/19 1945   10/12/19 1708  ceFAZolin (ANCEF) 2-4 GM/100ML-% IVPB       Note to Pharmacy: Bennye Alm   : cabinet override      10/12/19 1708 10/12/19 1849      Assessment/Plan: s/p Procedure(s): EXPLORATORY LAPAROTOMY omental patch repair perforated duodenal ulcer (N/A) Continue ng and bowel rest for now  POD#20-Perforated Duodenal Ulcer S/p Exploratory laparotomy, omental patch repair of duodenal ulcer - Dr. [name] - 10/12/2019 Continue NG, NPO; allow ice chips IR drain- CT 10/29/2019 shows decrease in size of collection, drain in position IV Zosyn TNA Wound care - open with dressing changes-hematoma on CT of no consequence, small  Continue NPO, NG decompression, IR drain in place. Again discussed withdaughterat bedside this AM.Plan repeat UGI seriesnext Tuesdayto assess healing of perforated ulcer after [place]. If no leak, will remove NG and allow clear liquid diet. Continue perc drainage and monitor quality and quantity of output.Encouraged OOB with PT.  LOS: 20 days    Autumn Messing III 11/01/2019

## 2019-11-01 NOTE — Progress Notes (Signed)
[place] for Heparin Indication: chest pain/ACS, left upper extremity DVT  Allergies  Allergen Reactions  . Losartan Potassium Other (See Comments)    Unknown reaction that happened 15 years ago   Patient Measurements: Height: 5\' 11"  (180.3 cm) Weight: 93.6 kg (206 lb 5.6 oz) IBW/kg (Calculated) : 75.3 Heparin Dosing Weight: 94 kg  Vital Signs: Temp: 98.6 F (37 C) (07/04 0400) Temp Source: Oral (07/04 0400) BP: 157/71 (07/04 0700) Pulse Rate: 82 (07/04 0700)  Labs: Recent Labs    10/29/19 2100 10/29/19 2257 10/30/19 0441 10/30/19 0441 10/31/19 0245 11/01/19 0010 11/01/19 0500  HGB   < >  --  8.2*   < > 9.1*  --  8.7*  HCT   < >  --  26.2*  --  29.3*  --  27.4*  PLT   < >  --  351  --  394  --  351  HEPARINUNFRC  --   --   --   --   --  1.01*  --   CREATININE  --  0.88 0.84  --  0.71  --   --    < > = values in this interval not displayed.   Estimated Creatinine Clearance: 80.3 mL/min (by C-G formula based on SCr of 0.71 mg/dL).  Medications:  Infusions:  . sodium chloride Stopped (10/30/19 1747)  . sodium chloride Stopped (10/31/19 1740)  . fluconazole (DIFLUCAN) IV Stopped (10/31/19 2000)  . heparin    . meropenem (MERREM) IV Stopped (11/01/19 0600)  . TPN ADULT (ION) 95 mL/hr at 10/31/19 1800  . vancomycin Stopped (11/01/19 0000)   Assessment: 86 yo male with perforated duodenal ulcer s/p omental patch repair.  Pharmacy consulted to dose IV heparin for NSTEMI as well as for acute deep vein thrombosis involving the left axillary vein, surrounding the PICC line. Findings consistent with acute superficial vein thrombosis involving the left cephalic vein. Patient with anemia of critical illenss/post-op s/p transfusion 6/27.   Significant events: 6/27: hold for Hgb 6.2 6/28: bloody stools, small amount of bleeding from surgical incision and JP drain  6/29: resume heparin 7/1: d/c heparin 2/2 hematoma at surgical site - of  no consequence per surgery 7/2: d/w MD - continue to hold, reconsider resuming in next several days 7/3: resume heparin  Today, 11/01/2019  Hgb low stable, platelets stable wnl  Transfused 6/27  HL 1.01 supra-therapeutic  No bleeding or line issues reported  Goal of Therapy:  Heparin level 0.3-0.5 units/ml Monitor platelets by anticoagulation protocol: Yes   Plan:   Hold infusion x 1 hours and resume at 900 units/hr  Recheck heparin level 8 hours after resuming  Daily heparin level and CBC  Continue to monitor for signs/symptoms of bleeding  Ulice Dash, PharmD, BCPS     11/01/2019, 7:24 AM

## 2019-11-01 NOTE — Progress Notes (Signed)
PROGRESS NOTE   Matthew Schwartz  HAF:[id number] DOB: 10-Feb-1933 DOA: 10/12/2019 PCP: [name], MD  Brief Narrative:  [name] NIDDM, HLD, GERD, irritable bowel syndrome (constipation) status post left knee replacement 2009, PTCA 2000-->RCA well as prior prostate CA, former smoker Admit 10/12/2019 severe abdominal pain-ex lap 10/12/2019-perforated duodenal ulcer and remained on vent NSVT postop-echo showed Systolic HF--was on pressors and cardiology consulted-eventually extubated 6/17 + fevers 6/23= postoperative abscess-IR  Placed abd perc drain 6/24--growing Candida /on diflucan On 6/30 upper GI series were performed-and repeated Unfortunately spiked a fever without cause-found to have on CT 7/1 enlarging hematoma General surgery felt this was minimal and not the cause for his fever He has not had high-grade fever since his Antibiotics were adjusted 7/1 vancomycin and Primaxin but continues to have low-grade Heparin was held temporarily until 7/3 and restarted cautiously 7/3 after risk-benefit discussion with family   Assessment & Plan:   Active Problems:   HTN (hypertension)   Malignant neoplasm of prostate (HCC)   Hyperlipidemia   Obstipation   HLD (hyperlipidemia)   Diabetes mellitus type 2, uncontrolled, with complications (HCC)   Perforated bowel ([place])   Perforated duodenal ulcer ([place])   Acute respiratory insufficiency   Elevated troponin level   Acute respiratory failure with hypoxemia (HCC)   PVCs (premature ventricular contractions)   Bilateral atelectasis   Acute abdominal pain   Palliative care by specialist   Goals of care, counseling/discussion   General weakness   Epigastric pain   1. Septic shock + peritonitis status post omental patch repair 6/14 a. UGI series 6/30 shows persisting small leak per general surgery-diet as per them-TPN continues as is n.p.o. b. Fluid collections noted 6/23-drain placement 6/24 = Candida-Rx fluconazole at this  time-Per IR drain study c. He had a T-max of 102 on 7/1 prompting change in antibiotics to vancomycin and meropenem in addition to his antifungal medications d. We do not have a clear source- DDx atelectasis based on x-ray 7/3 e. I discussed personally with Dr. [name] on the pho-ne on 7/2 and he did not feel that the enlarging hematoma was of great significance or was of a source of infection as if it was infected this would drain into the midline wound that is present currently 2. Hypoxic respiratory failure extubated 6/17-NG tube in place with current atelectasis a. Seems euvolemic-keep fluids even b. Monitor trends 3. Acute systolic heart failure + NSTEMI, CAD 2000 a. Remains on beta-blocker and clonidine patch b. Home calcium channel blocker ACE nitrates are on hold because of n.p.o. state c. Cardiology input appreciated and they will follow peripherally 4. Left upper extremity DVT cephalic/axillary vein likely secondary to PICC line a. Heparin discontinued on 7/1 after enlarging hematoma b. Resumed heparin without bolus 7/3 after discussions with specialists 5. DM TY 2 A1c 6.6 a. Not a diabetic prior to admission and probably this is from his TPN b. Sliding scale coverage as needed-sugars 111-122 6. Anemia critical illness + transfusion 6/27 2 units PRBC a. Hemoglobin is stabilized in the 8-9 range after 2 units given on 6/27 b. Repeat labs in a.m. 7. Malignancy of prostate a. Outpatient screening follow-up and PSA 8. Former smoker   DVT prophylaxis:  Code Status: full Family Communication: Discussed with daughter 7/4  Disposition:   Status is: Inpatient will be transferring to progressive floor from stepdown as he has been stable for a relatively long period of time  Remains inpatient appropriate because:Ongoing diagnostic testing needed not appropriate for  outpatient work up   [employer]:  Patient From: [place]  Planned Disposition: [place]  Expected discharge date: 11/05/19  Medically stable for discharge: No    Consultants:   General surgery  Cardiology  Procedures: None  Antimicrobials: IV Zosyn transitioned to meropenem and vancomycin 7/1 (total antibiotics so far since 6/14) IV Diflucan since 6/26   Subjective: Stable currently resting quietly Some pains at time per RN Swelling In L arm ~ same Asks "when will I be better"--mentioned we have a long way to go but remain optimistic   Objective: Vitals:   11/01/19 1100 11/01/19 1200 11/01/19 1300 11/01/19 1400  BP: 138/63  (!) 170/65 137/64  Pulse: 80 87 93 81  Resp: (!) 24 (!) 26 (!) 22 (!) 27  Temp:  97.8 F (36.6 C)    TempSrc:  Oral    SpO2: 97% 95% 98% 94%  Weight:      Height:        Intake/Output Summary (Last 24 hours) at 11/01/2019 1530 Last data filed at 11/01/2019 1454 Gross per 24 hour  Intake 3738.05 ml  Output 1650 ml  Net 2088.05 ml   Filed Weights   10/28/19 0500 10/30/19 0441 10/31/19 0400  Weight: 88.1 kg 86.8 kg 93.6 kg    Examination:  General exam: Awake alert coherent Respiratory system:no rales no rhonchi-no rales  Cardiovascular system: S1-S2 no murmur--sinus/sinus tach Gastrointestinal system: Bandage not disturbed nontender exam slight distension Central nervous system: Intact no focal deficit Extremities: Well perfused lower extremities no swelling Skin: As above Psychiatry: Quiet affect  Data Reviewed: I have personally reviewed following labs and imaging studies Phosphorus 2.5 and replaced BUN/creatinine 24/0.7 Hemoglobin 9.1-->8.7 White count 9--10.3  Radiology Studies: DG Chest 2 View  Result Date: 10/31/2019 CLINICAL DATA:  Pneumonia EXAM: CHEST - 2 VIEW COMPARISON:  Chest radiograph dated 10/29/2019 FINDINGS: A left upper extremity peripherally inserted central venous catheter tip overlies the superior cavoatrial junction, unchanged. An enteric tube enters the stomach and terminates below the  field of view. A pigtail catheter overlies the right upper quadrant, unchanged. The heart size and mediastinal contours are within normal limits. Mild atelectasis in both mid lungs is not significantly changed. There is no pleural effusion or pneumothorax. The visualized skeletal structures are unremarkable. IMPRESSION: Unchanged mild bilateral mid lung atelectasis. Electronically Signed   By: Zerita Boers M.D.   On: 10/31/2019 10:18   IR Sinus/Fist Tube Chk-Non GI  Result Date: 10/30/2019 INDICATION: 86 year old with history of repaired perforated duodenal ulcer. Patient continues to have a small amount of purulent output from the right upper quadrant drain. EXAM: SINUS TRACT INJECTION / FISTULOGRAM COMPARISON:  None. MEDICATIONS: None ANESTHESIA/SEDATION: None COMPLICATIONS: None immediate. TECHNIQUE: Patient was placed supine on the fluoroscopic table. Drain was injected with contrast. Subsequently, the drain was flushed with normal saline and attached to a gravity bag. PROCEDURE: Drain was injected with approximately 15 mL Omnipaque 300. FINDINGS: Pigtail catheter in the right upper abdomen. 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 Small irregular cavities around the drain but no large abscess cavity. 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 Drain was switched from a suction bulb to a gravity bag. Contrast fills small irregular cavities around the drain but no large abscess cavity. Electronically Signed   By: Markus Daft M.D.   On: 10/30/2019 17:20     Scheduled Meds: . Chlorhexidine Gluconate Cloth  6 each Topical Daily  .  cloNIDine  0.2 mg Transdermal Weekly  . insulin aspart  0-20 Units Subcutaneous Q6H  . insulin detemir  10 Units Subcutaneous BID  . lidocaine  1 patch Transdermal Q24H  . mouth rinse  15 mL Mouth Rinse BID  . metoprolol tartrate  5 mg Intravenous Q4H  . nitroGLYCERIN  1 inch Topical Q6H  . pantoprazole (PROTONIX) IV  40 mg  Intravenous Q12H  . sodium chloride flush  10-40 mL Intracatheter Q12H  . sodium chloride flush  5 mL Intracatheter Q8H   Continuous Infusions: . sodium chloride Stopped (10/30/19 1747)  . sodium chloride Stopped (10/31/19 2220)  . fluconazole (DIFLUCAN) IV Stopped (10/31/19 1940)  . heparin 900 Units/hr (11/01/19 1454)  . meropenem (MERREM) IV Stopped (11/01/19 1419)  . TPN ADULT (ION) 95 mL/hr at 11/01/19 1454  . TPN ADULT (ION)    . vancomycin Stopped (11/01/19 1206)     LOS: 20 days    Time spent: [place], MD [facility] To contact the attending provider between 7A-7P or the covering provider during after hours 7P-7A, please log into the web site [email or web address] and access using universal [place] password for that web site. If you do not have the password, please call the hospital operator.  11/01/2019, 3:30 PM

## 2019-11-02 ENCOUNTER — Inpatient Hospital Stay (HOSPITAL_COMMUNITY): Payer: Medicare Other

## 2019-11-02 DIAGNOSIS — K255 Chronic or unspecified gastric ulcer with perforation: Secondary | ICD-10-CM

## 2019-11-02 DIAGNOSIS — M7989 Other specified soft tissue disorders: Secondary | ICD-10-CM

## 2019-11-02 LAB — DIFFERENTIAL
Abs Immature Granulocytes: 0.1 10*3/uL — ABNORMAL HIGH (ref 0.00–0.07)
Basophils Absolute: 0.1 10*3/uL (ref 0.0–0.1)
Basophils Relative: 0 %
Eosinophils Absolute: 0.5 10*3/uL (ref 0.0–0.5)
Eosinophils Relative: 4 %
Immature Granulocytes: 1 %
Lymphocytes Relative: 9 %
Lymphs Abs: 1.3 10*3/uL (ref 0.7–4.0)
Monocytes Absolute: 1.4 10*3/uL — ABNORMAL HIGH (ref 0.1–1.0)
Monocytes Relative: 10 %
Neutro Abs: 10.3 10*3/uL — ABNORMAL HIGH (ref 1.7–7.7)
Neutrophils Relative %: 76 %

## 2019-11-02 LAB — GLUCOSE, CAPILLARY
Glucose-Capillary: 101 mg/dL — ABNORMAL HIGH (ref 70–99)
Glucose-Capillary: 109 mg/dL — ABNORMAL HIGH (ref 70–99)
Glucose-Capillary: 111 mg/dL — ABNORMAL HIGH (ref 70–99)
Glucose-Capillary: 112 mg/dL — ABNORMAL HIGH (ref 70–99)
Glucose-Capillary: 119 mg/dL — ABNORMAL HIGH (ref 70–99)
Glucose-Capillary: 124 mg/dL — ABNORMAL HIGH (ref 70–99)

## 2019-11-02 LAB — COMPREHENSIVE METABOLIC PANEL
ALT: 44 U/L (ref 0–44)
AST: 49 U/L — ABNORMAL HIGH (ref 15–41)
Albumin: 1.5 g/dL — ABNORMAL LOW (ref 3.5–5.0)
Alkaline Phosphatase: 86 U/L (ref 38–126)
Anion gap: 8 (ref 5–15)
BUN: 27 mg/dL — ABNORMAL HIGH (ref 8–23)
CO2: 22 mmol/L (ref 22–32)
Calcium: 8 mg/dL — ABNORMAL LOW (ref 8.9–10.3)
Chloride: 107 mmol/L (ref 98–111)
Creatinine, Ser: 0.69 mg/dL (ref 0.61–1.24)
GFR calc Af Amer: >60 mL/min (ref >60)
GFR calc non Af Amer: >60 mL/min (ref >60)
Glucose, Bld: 104 mg/dL — ABNORMAL HIGH (ref 70–99)
Potassium: 4.1 mmol/L (ref 3.5–5.1)
Sodium: 137 mmol/L (ref 135–145)
Total Bilirubin: 0.6 mg/dL (ref 0.3–1.2)
Total Protein: 7.1 g/dL (ref 6.5–8.1)

## 2019-11-02 LAB — HEPARIN LEVEL (UNFRACTIONATED)
Heparin Unfractionated: 0.2 IU/mL — ABNORMAL LOW (ref 0.30–0.70)
Heparin Unfractionated: 0.27 IU/mL — ABNORMAL LOW (ref 0.30–0.70)

## 2019-11-02 LAB — CBC
HCT: 28.7 % — ABNORMAL LOW (ref 39.0–52.0)
Hemoglobin: 9.1 g/dL — ABNORMAL LOW (ref 13.0–17.0)
MCH: 26.8 pg (ref 26.0–34.0)
MCHC: 31.7 g/dL (ref 30.0–36.0)
MCV: 84.7 fL (ref 80.0–100.0)
Platelets: 367 10*3/uL (ref 150–400)
RBC: 3.39 MIL/uL — ABNORMAL LOW (ref 4.22–5.81)
RDW: 17 % — ABNORMAL HIGH (ref 11.5–15.5)
WBC: 13.6 10*3/uL — ABNORMAL HIGH (ref 4.0–10.5)
nRBC: 0 % (ref 0.0–0.2)

## 2019-11-02 LAB — MAGNESIUM: Magnesium: 2.2 mg/dL (ref 1.7–2.4)

## 2019-11-02 LAB — PHOSPHORUS: Phosphorus: 2.7 mg/dL (ref 2.5–4.6)

## 2019-11-02 LAB — TRIGLYCERIDES: Triglycerides: 65 mg/dL (ref <150)

## 2019-11-02 LAB — PREALBUMIN: Prealbumin: 8.3 mg/dL — ABNORMAL LOW (ref 18–38)

## 2019-11-02 MED ORDER — TRAVASOL 10 % IV SOLN
INTRAVENOUS | Status: AC
  Filled 2019-11-02: qty 1208.4

## 2019-11-02 MED ORDER — ACETAMINOPHEN 10 MG/ML IV SOLN
1000.0000 mg | Freq: Four times a day (QID) | INTRAVENOUS | Status: AC
  Administered 2019-11-02 – 2019-11-03 (×4): 1000 mg via INTRAVENOUS
  Filled 2019-11-02 (×4): qty 100

## 2019-11-02 MED ORDER — ORAL CARE MOUTH RINSE
15.0000 mL | Freq: Two times a day (BID) | OROMUCOSAL | Status: DC
  Administered 2019-11-03 – 2019-11-13 (×17): 15 mL via OROMUCOSAL

## 2019-11-02 MED ORDER — CHLORHEXIDINE GLUCONATE 0.12 % MT SOLN
15.0000 mL | Freq: Two times a day (BID) | OROMUCOSAL | Status: DC
  Administered 2019-11-02 – 2019-12-01 (×51): 15 mL via OROMUCOSAL
  Filled 2019-11-02 (×46): qty 15

## 2019-11-02 MED ORDER — MORPHINE SULFATE (PF) 2 MG/ML IV SOLN
2.0000 mg | INTRAVENOUS | Status: DC | PRN
  Administered 2019-11-02: 2 mg via INTRAVENOUS
  Filled 2019-11-02: qty 1

## 2019-11-02 MED ORDER — SUCRALFATE 1 GM/10ML PO SUSP
1.0000 g | Freq: Two times a day (BID) | ORAL | Status: DC
  Administered 2019-11-02 – 2019-11-13 (×19): 1 g via ORAL
  Filled 2019-11-02 (×25): qty 10

## 2019-11-02 MED ORDER — FENTANYL CITRATE (PF) 100 MCG/2ML IJ SOLN
25.0000 ug | INTRAMUSCULAR | Status: DC | PRN
  Administered 2019-11-02 – 2019-11-08 (×21): 25 ug via INTRAVENOUS
  Filled 2019-11-02 (×21): qty 2

## 2019-11-02 MED ORDER — HEPARIN (PORCINE) 25000 UT/250ML-% IV SOLN
1400.0000 [IU]/h | INTRAVENOUS | Status: DC
  Administered 2019-11-03 – 2019-11-04 (×3): 1400 [IU]/h via INTRAVENOUS
  Filled 2019-11-02 (×4): qty 250

## 2019-11-02 NOTE — Progress Notes (Signed)
PHARMACY - TOTAL PARENTERAL NUTRITION CONSULT NOTE   Indication: Prolonged ileus  Patient Measurements: Height: 5\' 11"  (180.3 cm) Weight: 93.6 kg (206 lb 5.6 oz) IBW/kg (Calculated) : 75.3 TPN AdjBW (KG): 93.6 Body mass index is 28.78 kg/m. Usual Weight: 90.7 kg   Assessment:  - Pharmacy is consulted to start TPN in 86 yo male with prolonged ileus. Pt underwent exploratory laparotomy on 6/14 which revealed perforated duodenal ulcer.   Glucose / Insulin: hx of DM - on metformin PTA.  - CBGs < 150 with 60 units/bag insulin in TPN, rSSI q4h (range 122-161; goal 100-150) - one reading > 600 was error - 9 units of SS insulin required yesterday - Levemir 10 units BID per CCM started 6/18  Electrolytes: K (target >/= 4.0) is 4.1 . Magnesium 2.2 (target >/= 2.0), CCa 9.9; others wnl Renal: 0.69,   BUN 2 slightly elevated. 1.7 L UOP last 24hr decreased LFTs / TGs: LFTs remain WNL slight increase, Tbili improved to WNL after procedure; TG are now WNL after stopping propofol. Previously elevated level likely spurious  Prealbumin / albumin: prealbumin 8.3 ( 7/5) 9.6 (6/28), 9.0 (6/21); albumin 1.5 (7/5) Intake / Output; MIVF: NS at 10 ml/hr; NG output notl charted , UOP 1.7 L GI Imaging:  - 6/21 Upper GI series: small leak from superior margin of duodenal bulb. - 6/23 CT abdomen: 4.3 cm air-fluid collection in the right upper quadrant of the abdomen between the duodenal bulb and gallbladder, not containing enteric contrast. No residual leak from the duodenal bulb identified. Surgeries / Procedures:  - 6/14 Exploratory laparotomy which revealed perforated duodenal ulcer - 6/24 RUQ drain placed > 10 ml > cultured - 6/25 removal R abd JP drain   Central access:  PICC Line 6/15 TPN start date: 6/15  Nutritional Goals:   (per RD recommendation on 6/15): Kcal:  2200-2400 Protein:  115-125 grams Fluid:  >2L/d Goal TPN rate is 95 mL/hr (provides 121 g of protein and 2262 kcals per  day)  Current Nutrition:  NPO   Plan:   Continue TPN at 95 mL/hr at 1800, goal rate   Electrolytes in TPN: inc 85 mEq/L of Na, 85 mEq/L of K , 4 mEq/L of Ca,15 mEq/L of Magnesium, and 15 mmol/L of Phos. Cl:Ac ratio max Ac  Standard MVI and trace elements to TPN  Continue resistant q6h SSI, and monitor Levemir and adjust as needed   Continue 60 units of regular insulin/day in TPN   Continue MIVF to KVO mL/hr at 1800  BMP, magnesium, phosporus  Monitor TPN labs on Mon/Thurs    Royetta Asal, PharmD, BCPS 11/02/2019 10:16 AM

## 2019-11-02 NOTE — Progress Notes (Signed)
ANTICOAGULATION CONSULT NOTE - Follow Up Consult  Pharmacy Consult for Heparin Indication: LUE DVT  Allergies  Allergen Reactions  . Losartan Potassium Other (See Comments)    Unknown reaction that happened 15 years ago    Patient Measurements: Height: 5\' 11"  (180.3 cm) Weight: 93.6 kg (206 lb 5.6 oz) IBW/kg (Calculated) : 75.3 Heparin Dosing Weight:   Vital Signs: Temp: 98.6 F (37 C) (07/05 0342) Temp Source: Oral (07/05 0342) BP: 146/66 (07/04 2300) Pulse Rate: 85 (07/04 2300)  Labs: Recent Labs    10/31/19 0245 11/01/19 0010 11/01/19 0500 11/01/19 0645 11/01/19 1730 11/02/19 0300  HGB 9.1*  --  8.7*  --   --   --   HCT 29.3*  --  27.4*  --   --   --   PLT 394  --  351  --   --   --   HEPARINUNFRC  --  1.01*  --   --  <0.10* 0.20*  CREATININE 0.71  --   --  0.74  --   --     Estimated Creatinine Clearance: 80.3 mL/min (by C-G formula based on SCr of 0.74 mg/dL).   Medications:  Infusions:  . sodium chloride Stopped (10/30/19 1747)  . sodium chloride Stopped (10/31/19 2220)  . fluconazole (DIFLUCAN) IV Stopped (11/01/19 1930)  . heparin 1,100 Units/hr (11/01/19 1922)  . meropenem (MERREM) IV Stopped (11/01/19 2220)  . TPN ADULT (ION) 95 mL/hr at 11/01/19 1844  . vancomycin Stopped (11/02/19 0015)    Assessment: Patient with low heparin level.  No heparin issues per RN.  Goal of Therapy:  Heparin level 0.3-0.5 units/ml Monitor platelets by anticoagulation protocol: Yes   Plan:  Increase heparin to 1200 units/hr Recheck heparin level at [address], [place] 11/02/2019,4:43 AM

## 2019-11-02 NOTE — Progress Notes (Signed)
Daily Progress Note   Patient Name: Matthew Schwartz       Date: 11/02/2019 DOB: 1933/03/08  Age: 86 y.o. MRN#: [id number] Attending Physician: Nita Sells, MD Primary Care Physician: [name], MD Admit Date: 10/12/2019  Reason for Consultation/Follow-up: Establishing goals of care  Subjective: I saw and examined Matthew Schwartz today and spoke with his daughter at the bedside.  Dr. [name] also present for portion of my encounter.    We discussed pain management and plan for trial of scheduled IV tylenol and rotating from morphine to fentanyl.  We also discussed his clinical course and family hopes for continued stabilization and improvement.  Reviewed plan for repeat UGI and also potential discharge pathways.  Daughter appreciative of conversation and appreciates continued updates and conversation.  Length of Stay: 21  Current Medications: Scheduled Meds:  . chlorhexidine  15 mL Mouth Rinse BID  . Chlorhexidine Gluconate Cloth  6 each Topical Daily  . cloNIDine  0.2 mg Transdermal Weekly  . insulin aspart  0-20 Units Subcutaneous Q6H  . insulin detemir  10 Units Subcutaneous BID  . lidocaine  1 patch Transdermal Q24H  . [START ON 11/03/2019] mouth rinse  15 mL Mouth Rinse q12n4p  . metoprolol tartrate  5 mg Intravenous Q4H  . nitroGLYCERIN  1 inch Topical Q6H  . pantoprazole (PROTONIX) IV  40 mg Intravenous Q12H  . sodium chloride flush  10-40 mL Intracatheter Q12H  . sodium chloride flush  5 mL Intracatheter Q8H  . sucralfate  1 g Oral BID    Continuous Infusions: . sodium chloride Stopped (10/30/19 1747)  . sodium chloride Stopped (10/31/19 2220)  . acetaminophen Stopped (11/02/19 1740)  . fluconazole (DIFLUCAN) IV Stopped (11/02/19 2017)  . heparin 1,400 Units/hr  (11/02/19 2202)  . meropenem (MERREM) IV Stopped (11/02/19 2200)  . TPN ADULT (ION) 95 mL/hr at 11/02/19 2202  . vancomycin 1,250 mg (11/02/19 2204)    PRN Meds: sodium chloride, bisacodyl, fentaNYL (SUBLIMAZE) injection, hydrALAZINE, ipratropium-albuterol, lip balm, LORazepam, ondansetron (ZOFRAN) IV, silver nitrate applicators, sodium chloride flush  Physical Exam         Elderly male resting in bed Has NG tube with suction Has some dependent edema.  Hands swollen. Regular work of breathing S1-S2 Abdomen not distended  Vital Signs: BP (!) 173/56 (BP Location: Left Leg)  Pulse 75   Temp 97.8 F (36.6 C) (Oral)   Resp (!) 25   Ht 5\' 11"  (1.803 m)   Wt 93.6 kg   SpO2 99%   BMI 28.78 kg/m  SpO2: SpO2: 99 % O2 Device: O2 Device: Room Air O2 Flow Rate: O2 Flow Rate (L/min): 2 L/min  Intake/output summary:   Intake/Output Summary (Last 24 hours) at 11/02/2019 2339 Last data filed at 11/02/2019 2202 Gross per 24 hour  Intake 4111.46 ml  Output 2380 ml  Net 1731.46 ml   LBM: Last BM Date: 11/01/19 Baseline Weight: Weight: 96.4 kg Most recent weight: Weight: 93.6 kg       Palliative Assessment/Data:    Flowsheet Rows     Most Recent Value  Intake Tab  Referral Department Hospitalist  Unit at Time of Referral ICU  Date Notified 10/23/19  Palliative Care Type New Palliative care  Reason for referral Clarify Goals of Care  Date of Admission 10/12/19  Date first seen by Palliative Care 10/24/19  # of days Palliative referral response time 1 Day(s)  # of days IP prior to Palliative referral 11  Clinical Assessment  Psychosocial & Spiritual Assessment  Palliative Care Outcomes     Palliative performance scale 30% Patient Active Problem List   Diagnosis Date Noted  . Epigastric pain   . Palliative care by specialist   . Goals of care, counseling/discussion   . General weakness   . Acute abdominal pain   . Bilateral atelectasis 10/17/2019  . Acute respiratory  insufficiency   . Elevated troponin level   . Acute respiratory failure with hypoxemia ([place])   . PVCs (premature ventricular contractions)   . Obstipation 10/12/2019  . HLD (hyperlipidemia) 10/12/2019  . Diabetes mellitus type 2, uncontrolled, with complications ([place]) 50/27/7412  . Perforated bowel ([place]) 10/12/2019  . Perforated duodenal ulcer ([place]) 10/12/2019  . Anemia 09/07/2016  . Coronary artery disease due to lipid rich plaque 02/16/2015  . Hyperlipidemia 02/16/2015  . Malignant neoplasm of prostate ([place]) 02/18/2014  . HTN (hypertension) 06/14/2013  . Dyslipidemia 06/14/2013  . GERD (gastroesophageal reflux disease) 06/14/2013    Palliative Care Assessment & Plan   Patient Profile:    Assessment: 86 year old gentleman with coronary artery disease status post percutaneous intervention in the year 2000, hypertension, dyslipidemia, diabetes who was admitted with perforated duodenal ulcer and peritonitis. Cardiology has been following for NSVT, intermittent left bundle branch block and anterolateral T wave inversions as well as elevated troponin, at some point is supposed to undergo cardiac catheterization after acute issues resolved. General surgery also following.  Has NG tube.  To continue TPN.  Plan for another upper GI this week.  Recommendations/Plan:  Full code/Full scope care  Family remains invested in plan for continued aggressive care.  Discussed today concerns that he is having slow progress at best and remains at high risk for decompensation.  Discussed potential discharge once medically stable.  Options reviewed included CIR, LTACH, or SNF based upon clinical course.  Appreciate TOC continued assistance.  Family goal is for him to be as close to home as possible at next care venue, and ultimate goal is to be at home. His daughter reports that they have a lot of family support at home.  Pain: Reports pain worse in hands today.  Overall, he continues to have pain that  is poorly controlled at times.  At the same time, he remains sleepy and not able to participate in therapy.  Plan to start  trial of IV tylenol and to rotate from morphine to fentanyl to see if shorter acting medication controls pain but allows him to be more awake.    PMT will follow periodically, throughout the course of this hospitalization.    Goals of Care and Additional Recommendations:  Limitations on Scope of Treatment: Full Scope Treatment  Code Status:    Code Status Orders  (From admission, onward)         Start     Ordered   10/12/19 1208  Full code  Continuous        10/12/19 1212        Code Status History    Date Active Date Inactive Code Status Order ID Comments User Context   02/12/2014 1127 02/13/2014 0336 Full Code [id number]  Arne Cleveland, MD [place] Planning Activity    Advance Directive Documentation     Most Recent Value  Type of Advance Directive Healthcare Power of Attorney, Living will  Pre-existing out of facility DNR order (yellow form or pink MOST form) --  "MOST" Form in Place? --       Prognosis:   Unable to determine Guarded, continue to monitor hospital course and overall disease trajectory.  Discharge Planning:  To Be Determined Recommend: Inpatient rehab, LTACH, or SNF rehab with palliative  Care plan was discussed with daughter  Thank you for allowing the Palliative Medicine Team to assist in the care of this patient.   Total Time 40 Prolonged Time Billed  no    Greater than 50%  of this time was spent counseling and coordinating care related to the above assessment and plan.  Micheline Rough, MD  Please contact Palliative Medicine Team phone at [phone] for questions and concerns.

## 2019-11-02 NOTE — Progress Notes (Signed)
21 Days Post-Op   Subjective/Chief Complaint: No complaints today   Objective: Vital signs in last 24 hours: Temp:  [97.7 F (36.5 C)-99.9 F (37.7 C)] 99.7 F (37.6 C) (07/05 0800) Pulse Rate:  [77-93] 85 (07/04 2300) Resp:  [22-27] 26 (07/04 2300) BP: (135-170)/(63-73) 146/66 (07/04 2300) SpO2:  [92 %-98 %] 94 % (07/04 2300) Last BM Date: 10/31/19  Intake/Output from previous day: 07/04 0701 - 07/05 0700 In: 5496.6 [I.V.:3726.3; IV Piggyback:1770.4] Out: 1800 [Urine:1700; Emesis/NG output:100] Intake/Output this shift: No intake/output data recorded.  General appearance: alert and cooperative Resp: clear to auscultation bilaterally Cardio: regular rate and rhythm GI: Soft, nontender.  Minimal drain output  Lab Results:  Recent Labs    11/01/19 0500 11/02/19 0540  WBC 10.3 13.6*  HGB 8.7* 9.1*  HCT 27.4* 28.7*  PLT 351 367   BMET Recent Labs    10/31/19 0245 11/01/19 0645  NA 135 136  K 4.3 3.7  CL 103 105  CO2 24 24  GLUCOSE 138* 123*  BUN 25* 24*  CREATININE 0.71 0.74  CALCIUM 7.9* 7.8*   PT/INR No results for input(s): LABPROT, INR in the last 72 hours. ABG No results for input(s): PHART, HCO3 in the last 72 hours.  Invalid input(s): PCO2, PO2  Studies/Results: DG Chest 2 View  Result Date: 10/31/2019 CLINICAL DATA:  Pneumonia EXAM: CHEST - 2 VIEW COMPARISON:  Chest radiograph dated 10/29/2019 FINDINGS: A left upper extremity peripherally inserted central venous catheter tip overlies the superior cavoatrial junction, unchanged. An enteric tube enters the stomach and terminates below the field of view. A pigtail catheter overlies the right upper quadrant, unchanged. The heart size and mediastinal contours are within normal limits. Mild atelectasis in both mid lungs is not significantly changed. There is no pleural effusion or pneumothorax. The visualized skeletal structures are unremarkable. IMPRESSION: Unchanged mild bilateral mid lung atelectasis.  Electronically Signed   By: Zerita Boers M.D.   On: 10/31/2019 10:18    Anti-infectives: Anti-infectives (From admission, onward)   Start     Dose/Rate Route Frequency Ordered Stop   10/31/19 2200  vancomycin (VANCOREADY) IVPB 1250 mg/250 mL     Discontinue     1,250 mg 166.7 mL/hr over 90 Minutes Intravenous Every 12 hours 10/31/19 1602     10/29/19 2200  meropenem (MERREM) 1 g in sodium chloride 0.9 % 100 mL IVPB     Discontinue     1 g 200 mL/hr over 30 Minutes Intravenous Every 8 hours 10/29/19 2008     10/29/19 2200  vancomycin (VANCOCIN) IVPB 1000 mg/200 mL premix  Status:  Discontinued        1,000 mg 200 mL/hr over 60 Minutes Intravenous Every 12 hours 10/29/19 2008 10/31/19 1602   10/25/19 1800  fluconazole (DIFLUCAN) IVPB 400 mg     Discontinue     400 mg 100 mL/hr over 120 Minutes Intravenous Every 24 hours 10/24/19 1750     10/24/19 1830  fluconazole (DIFLUCAN) IVPB 800 mg  Status:  Discontinued        800 mg 200 mL/hr over 120 Minutes Intravenous  Once 10/24/19 1750 10/24/19 1755   10/24/19 1830  fluconazole (DIFLUCAN) IVPB 800 mg        800 mg 100 mL/hr over 240 Minutes Intravenous Every 24 hours 10/24/19 1756 10/24/19 2240   10/12/19 2000  piperacillin-tazobactam (ZOSYN) IVPB 3.375 g  Status:  Discontinued        3.375 g 12.5 mL/hr over 240  Minutes Intravenous Every 8 hours 10/12/19 1911 10/29/19 1945   10/12/19 1708  ceFAZolin (ANCEF) 2-4 GM/100ML-% IVPB       Note to Pharmacy: Bennye Alm   : cabinet override      10/12/19 1708 10/12/19 1849      Assessment/Plan: s/p Procedure(s): EXPLORATORY LAPAROTOMY omental patch repair perforated duodenal ulcer (N/A) Continue ng for now  TPN for nutrition support UGI tomorrow to evaluate leak after repair of perf ulcer POD#21-Perforated Duodenal Ulcer S/p Exploratory laparotomy, omental patch repair of duodenal ulcer - Dr. [name] - 10/12/2019 Continue NG, NPO; allow ice chips IR drain- CT  10/29/2019 shows decrease in size of collection, drain in position IV Zosyn TNA Wound care - open with dressing changes-hematoma on CT of no consequence, small  Continue NPO, NG decompression, IR drain in place. Again discussed withdaughterat bedside this AM.Plan repeat UGI seriesnext Tuesdayto assess healing of perforated ulcer after [place]. If no leak, will remove NG and allow clear liquid diet. Continue perc drainage and monitor quality and quantity of output.Encouraged OOB with PT.  LOS: 21 days    Matthew Schwartz 11/02/2019

## 2019-11-02 NOTE — Progress Notes (Signed)
PT Cancellation Note  Patient Details Name: Matthew Schwartz MRN: [id number] DOB: 11/24/1932   Cancelled Treatment:    Reason Eval/Treat Not Completed: Fatigue/lethargy limiting ability to participate, patient is not arouseable. Daughter reports patient is  finally comfortable, has been complaining of bilateral hand pain. Will check back tomorrow.    Claretha Cooper 11/02/2019, 2:52 PM [place] Pager [phone] Office [phone]

## 2019-11-02 NOTE — Progress Notes (Signed)
Consult received to assess PIV in R forearm. Flushed with 10 ml of NS. No swelling noted. No pain expressed by patient. Good pulses noted. Heparin infusing through line. There is 1-2+ edema to hands bilaterally and painful to touch. Prelimary US doppler report indicate no superficial or deep vein thrombosis. BP cuff was being used on R arm. Instructed to move to lower extremity d/t PIV and PICC in right and left arm respectively. Suggested elevating BUE above level of heart to facilitate venous return and decrease depend edema.

## 2019-11-02 NOTE — Progress Notes (Signed)
IV team at bedside, IV WNL, no concerns noted, does not recommend removing current access or placing new access, MD Samtani notified, will keep current RIGHT ARM PIV in place. Prelim report of RUE [place] given to MD Samtani, no blood clots noted on exam.

## 2019-11-02 NOTE — Progress Notes (Signed)
[place] for Heparin Indication: chest pain/ACS, left upper extremity DVT  Allergies  Allergen Reactions  . Losartan Potassium Other (See Comments)    Unknown reaction that happened 15 years ago   Patient Measurements: Height: 5\' 11"  (180.3 cm) Weight: 93.6 kg (206 lb 5.6 oz) IBW/kg (Calculated) : 75.3 Heparin Dosing Weight: 94 kg  Vital Signs: Temp: 97.4 F (36.3 C) (07/05 1200) Temp Source: Oral (07/05 1200) BP: 133/69 (07/05 1300) Pulse Rate: 93 (07/05 1300)  Labs: Recent Labs    10/31/19 0245 11/01/19 0010 11/01/19 0500 11/01/19 0645 11/01/19 1730 11/02/19 0300 11/02/19 0540 11/02/19 1352  HGB 9.1*  --  8.7*  --   --   --  9.1*  --   HCT 29.3*  --  27.4*  --   --   --  28.7*  --   PLT 394  --  351  --   --   --  367  --   HEPARINUNFRC  --    < >  --   --  <0.10* 0.20*  --  0.27*  CREATININE 0.71  --   --  0.74  --   --  0.69  --    < > = values in this interval not displayed.   Estimated Creatinine Clearance: 80.3 mL/min (by C-G formula based on SCr of 0.69 mg/dL).  Medications:  Infusions:  . sodium chloride Stopped (10/30/19 1747)  . sodium chloride Stopped (10/31/19 2220)  . fluconazole (DIFLUCAN) IV Stopped (11/01/19 1930)  . heparin 1,200 Units/hr (11/02/19 1300)  . meropenem (MERREM) IV 1 g (11/02/19 1409)  . TPN ADULT (ION) 95 mL/hr at 11/02/19 1300  . TPN ADULT (ION)    . vancomycin Stopped (11/02/19 1104)   Assessment: 86 yo male with perforated duodenal ulcer s/p omental patch repair.  Pharmacy consulted to dose IV heparin for NSTEMI as well as for acute deep vein thrombosis involving the left axillary vein, surrounding the PICC line. Findings consistent with acute superficial vein thrombosis involving the left cephalic vein. Patient with anemia of critical illenss/post-op s/p transfusion 6/27.   Significant events: 6/27: hold for Hgb 6.2 6/28: bloody stools, small amount of bleeding from surgical incision  and JP drain  6/29: resume heparin 7/1: d/c heparin 2/2 hematoma at surgical site - of no consequence per surgery 7/2: d/w MD - continue to hold, reconsider resuming in next several days 7/3: resume heparin  Today, 11/02/2019  Hgb low stable, platelets stable wnl  Transfused 6/27  HL 0.27  subtherapeutic  No line issues reported  Small bleeding noted at site of tube, thought to be gastritis  Also note swelling of hand, MD concerned if related to clot, change target heparin level to 0.3 to 0.7   Goal of Therapy:  Heparin level 0.3-0.7 units/ml Monitor platelets by anticoagulation protocol: Yes   Plan:   Increase heparin infusion to 1400 units/hr   HL 8 hours after rate change  Daily  CBC orders placed   Continue to monitor for signs/symptoms of bleeding   Royetta Asal, PharmD, BCPS 11/02/2019 2:38 PM

## 2019-11-02 NOTE — TOC Progression Note (Signed)
Transition of Care [facility]) - Progression Note    Patient Details  Name: Matthew Schwartz MRN: [id number] Date of Birth: Jun 03, 1932  Transition of Care [facility] CM/SW Contact  Leeroy Cha, RN Phone Number: 11/02/2019, 8:10 AM  Clinical Narrative:    Referral to both ltach's done.   Expected Discharge Plan: IP Rehab Facility Barriers to Discharge: Continued Medical Work up  Expected Discharge Plan and Services Expected Discharge Plan: [place]   Discharge Planning Services: CM Consult   Living arrangements for the past 2 months: Single Family Home                                       Social Determinants of Health (SDOH) Interventions    Readmission Risk Interventions No flowsheet data found.

## 2019-11-02 NOTE — Progress Notes (Signed)
Right upper extremity venous duplex has been completed. Preliminary results can be found in CV Proc through chart review.  Results were given to the charge nurse, Darrick Meigs.  11/02/19 4:06 PM Carlos Levering RVT

## 2019-11-02 NOTE — Progress Notes (Signed)
PROGRESS NOTE   Matthew Schwartz  GHW:[id number] DOB: Jan 09, 1933 DOA: 10/12/2019 PCP: [name], MD  Brief Narrative:  [name] NIDDM, HLD, GERD, irritable bowel syndrome (constipation) status post left knee replacement 2009, PTCA 2000-->RCA well as prior prostate CA, former smoker Admit 10/12/2019 severe abdominal pain-ex lap 10/12/2019-perforated duodenal ulcer and remained on vent NSVT postop-echo showed Systolic HF--was on pressors and cardiology consulted-eventually extubated 6/17 + fevers 6/23= postoperative abscess-IR  Placed abd perc drain 6/24--growing Candida /on diflucan On 6/30 upper GI series were performed-and repeated Unfortunately spiked a fever without cause-found to have on CT 7/1 enlarging hematoma General surgery felt this was minimal and not the cause for his fever He has not had high-grade fever since his Antibiotics were adjusted 7/1 vancomycin and Primaxin but continues to have low-grade Heparin was held temporarily until 7/3 and restarted cautiously 7/3 after risk-benefit discussion with family   Assessment & Plan:   Active Problems:   HTN (hypertension)   Malignant neoplasm of prostate (HCC)   Hyperlipidemia   Obstipation   HLD (hyperlipidemia)   Diabetes mellitus type 2, uncontrolled, with complications (HCC)   Perforated bowel ([place])   Perforated duodenal ulcer ([place])   Acute respiratory insufficiency   Elevated troponin level   Acute respiratory failure with hypoxemia (HCC)   PVCs (premature ventricular contractions)   Bilateral atelectasis   Acute abdominal pain   Palliative care by specialist   Goals of care, counseling/discussion   General weakness   Epigastric pain   1. Septic shock + peritonitis status post omental patch repair 6/14 a. UGI series 6/30 shows persisting small leak per general surgery-diet as per them-TPN continues as is n.p.o. b. Fluid collections noted 6/23-drain placement 6/24 = Candida-Rx fluconazole at this  time-Per IR drain study c. He had a T-max of 102 on 7/1 prompting change in antibiotics to vancomycin and meropenem in addition to his antifungal medications d. No clear singular source but many reasons for fever given abscess drain + possible intra-abdominal issues related to surgery- DDx atelectasis based on x-ray 7/3 e. D/W Dr. [name] 7/2 and he did not feel that the enlarging hematoma was of great significance or was of a source of infection as if it was infected this would drain into the midline wound that is present currently f.  he had a mild leukocytosis of 13.6 on 7/5 so I will get A 2 view x-ray in a.m. 7/6 2. Hypoxic respiratory failure extubated 6/17-NG tube in place with current atelectasis a. Seems euvolemic-keep fluids even b. Monitor trends 3. Acute systolic heart failure + NSTEMI, CAD 2000 a. Remains on beta-blocker and clonidine patch IV and home meds orally are held b. Cardiology input appreciated and they will follow peripherally 4. Left upper extremity DVT cephalic/axillary vein likely secondary to PICC line a. Heparin discontinued on 7/1 after enlarging hematoma b. Resumed heparin without bolus 7/3 after discussions with specialists c. Hemoglobin has remained stable throughout this time since 7/1 5. DM TY 2 A1c 6.6 a. Not a diabetic prior to admission and probably this is from his TPN b. Sliding scale coverage as needed-sugars 10 1-1 11 6. Anemia critical illness + transfusion 6/27 2 units PRBC a. Hemoglobin is stabilized in the 8-9 range after 2 units given on 6/27 b. Repeat labs in a.m. 7. Malignancy of prostate a. Outpatient screening follow-up and PSA 8. Former smoker   DVT prophylaxis:  Code Status: full Family Communication: Discussed with daughter 7/4  Disposition:   Status is:  Inpatient will be transferring to progressive floor from stepdown as he has been stable for a relatively long period of time  Remains inpatient appropriate because:Ongoing  diagnostic testing needed not appropriate for outpatient work up   Dispo:  Patient From: [place]  Planned Disposition: [place]  Expected discharge date: 11/05/19  Medically stable for discharge: No    Consultants:   General surgery  Cardiology  Procedures: None  Antimicrobials: IV Zosyn transitioned to meropenem and vancomycin 7/1 (total antibiotics so far since 6/14) IV Diflucan since 6/26   Subjective: Patient has not been up out of bed he seems very weak He says that his pain is moderate in his abdomen His daughter is at the bedside For the past several days that I have seen him he has not been awake enough and I feel that palliative care should be able to help [place] if they are able to delineate the meds in addition to anxiolytics as this may make him wake up a bit more  Objective: Vitals:   11/01/19 1935 11/01/19 2300 11/02/19 0342 11/02/19 0800  BP:  (!) 146/66    Pulse:  85    Resp:  (!) 26    Temp: 97.7 F (36.5 C) 99.9 F (37.7 C) 98.6 F (37 C) 99.7 F (37.6 C)  TempSrc: Oral Oral Oral Axillary  SpO2:  94%    Weight:      Height:        Intake/Output Summary (Last 24 hours) at 11/02/2019 1108 Last data filed at 11/02/2019 0600 Gross per 24 hour  Intake 3330.05 ml  Output 1800 ml  Net 1530.05 ml   Filed Weights   10/28/19 0500 10/30/19 0441 10/31/19 0400  Weight: 88.1 kg 86.8 kg 93.6 kg    Examination:  General exam: Awake alert coherent no distress but somewhat somnolent Respiratory system:no rales no rhonchi-no rales-cannot appreciate any adventitious sounds Cardiovascular system: S1-S2 no murmur--sinus/sinus tach Gastrointestinal system: Bandage not disturbed nontender exam slight distension no rebound no guarding Central nervous system: Intact no focal deficit Extremities: Well perfused lower extremities no swelling Skin: As above Psychiatry: Quiet affect  Data Reviewed: I have personally reviewed following  labs and imaging studies BUN/creatinine 27/0.6 Prealbumin 8.3 White count 13.6 which is up from prior 10.3 Radiology Studies: No results found.   Scheduled Meds: . Chlorhexidine Gluconate Cloth  6 each Topical Daily  . cloNIDine  0.2 mg Transdermal Weekly  . insulin aspart  0-20 Units Subcutaneous Q6H  . insulin detemir  10 Units Subcutaneous BID  . lidocaine  1 patch Transdermal Q24H  . mouth rinse  15 mL Mouth Rinse BID  . metoprolol tartrate  5 mg Intravenous Q4H  . nitroGLYCERIN  1 inch Topical Q6H  . pantoprazole (PROTONIX) IV  40 mg Intravenous Q12H  . sodium chloride flush  10-40 mL Intracatheter Q12H  . sodium chloride flush  5 mL Intracatheter Q8H   Continuous Infusions: . sodium chloride Stopped (10/30/19 1747)  . sodium chloride Stopped (10/31/19 2220)  . fluconazole (DIFLUCAN) IV Stopped (11/01/19 1930)  . heparin 1,200 Units/hr (11/02/19 0547)  . meropenem (MERREM) IV Stopped (11/02/19 0749)  . TPN ADULT (ION) 95 mL/hr at 11/01/19 1844  . TPN ADULT (ION)    . vancomycin 1,250 mg (11/02/19 0934)     LOS: 21 days    Time spent: [place], MD [facility] To contact the attending provider between 7A-7P or the covering provider during after hours  7P-7A, please log into the web site [email or web address] and access using universal [place] password for that web site. If you do not have the password, please call the hospital operator.  11/02/2019, 11:08 AM

## 2019-11-02 NOTE — Progress Notes (Signed)
Small blood clots noted from NG tube, currently hooked to LIS, flushed/pink tinged output. Surgeon A. Thomas paged, will continue to keep NG to LIS and add carafate BID, will clamp tube x1 hour post admin. Heparin gtt to continue per pharmacy.   Patient family concerned regarding 2+ swelling in right hand, tender/warm to touch, swelling does not extend past wrist, marked on patient. IV on right arm WNL on assessment, blood return noted, not tender at site, no pain with flushing. Extremities elevated, cold compress placed bilaterally.  Will have IV team look at current right arm PIV.

## 2019-11-03 ENCOUNTER — Inpatient Hospital Stay (HOSPITAL_COMMUNITY): Payer: Medicare Other

## 2019-11-03 LAB — COMPREHENSIVE METABOLIC PANEL
ALT: 48 U/L — ABNORMAL HIGH (ref 0–44)
AST: 48 U/L — ABNORMAL HIGH (ref 15–41)
Albumin: 1.4 g/dL — ABNORMAL LOW (ref 3.5–5.0)
Alkaline Phosphatase: 82 U/L (ref 38–126)
Anion gap: 8 (ref 5–15)
BUN: 29 mg/dL — ABNORMAL HIGH (ref 8–23)
CO2: 23 mmol/L (ref 22–32)
Calcium: 7.6 mg/dL — ABNORMAL LOW (ref 8.9–10.3)
Chloride: 105 mmol/L (ref 98–111)
Creatinine, Ser: 0.68 mg/dL (ref 0.61–1.24)
GFR calc Af Amer: >60 mL/min (ref >60)
GFR calc non Af Amer: >60 mL/min (ref >60)
Glucose, Bld: 135 mg/dL — ABNORMAL HIGH (ref 70–99)
Potassium: 4.1 mmol/L (ref 3.5–5.1)
Sodium: 136 mmol/L (ref 135–145)
Total Bilirubin: 0.7 mg/dL (ref 0.3–1.2)
Total Protein: 6.2 g/dL — ABNORMAL LOW (ref 6.5–8.1)

## 2019-11-03 LAB — GLUCOSE, CAPILLARY
Glucose-Capillary: 101 mg/dL — ABNORMAL HIGH (ref 70–99)
Glucose-Capillary: 107 mg/dL — ABNORMAL HIGH (ref 70–99)
Glucose-Capillary: 129 mg/dL — ABNORMAL HIGH (ref 70–99)
Glucose-Capillary: 97 mg/dL (ref 70–99)
Glucose-Capillary: >600 mg/dL (ref 70–99)

## 2019-11-03 LAB — MAGNESIUM
Magnesium: 4.6 mg/dL — ABNORMAL HIGH (ref 1.7–2.4)
Magnesium: 2.3 mg/dL (ref 1.7–2.4)

## 2019-11-03 LAB — CBC
HCT: 25.8 % — ABNORMAL LOW (ref 39.0–52.0)
Hemoglobin: 8 g/dL — ABNORMAL LOW (ref 13.0–17.0)
MCH: 29.9 pg (ref 26.0–34.0)
MCHC: 31 g/dL (ref 30.0–36.0)
MCV: 96.3 fL (ref 80.0–100.0)
Platelets: 284 10*3/uL (ref 150–400)
RBC: 2.68 MIL/uL — ABNORMAL LOW (ref 4.22–5.81)
RDW: 18.6 % — ABNORMAL HIGH (ref 11.5–15.5)
WBC: 9.9 10*3/uL (ref 4.0–10.5)
nRBC: 0 % (ref 0.0–0.2)

## 2019-11-03 LAB — HEPARIN LEVEL (UNFRACTIONATED)
Heparin Unfractionated: 0.31 IU/mL (ref 0.30–0.70)
Heparin Unfractionated: 0.32 IU/mL (ref 0.30–0.70)
Heparin Unfractionated: 0.41 IU/mL (ref 0.30–0.70)

## 2019-11-03 LAB — PHOSPHORUS
Phosphorus: 8.3 mg/dL — ABNORMAL HIGH (ref 2.5–4.6)
Phosphorus: 2.7 mg/dL (ref 2.5–4.6)

## 2019-11-03 LAB — CULTURE, BLOOD (SINGLE): Culture: NO GROWTH

## 2019-11-03 MED ORDER — TRAVASOL 10 % IV SOLN
INTRAVENOUS | Status: AC
  Filled 2019-11-03: qty 1208.4

## 2019-11-03 MED ORDER — MELATONIN 5 MG PO TABS
10.0000 mg | ORAL_TABLET | Freq: Every evening | ORAL | Status: DC | PRN
  Administered 2019-11-03 – 2019-11-24 (×6): 10 mg
  Filled 2019-11-03 (×6): qty 2

## 2019-11-03 MED ORDER — IOHEXOL 300 MG/ML  SOLN
125.0000 mL | Freq: Once | INTRAMUSCULAR | Status: AC | PRN
  Administered 2019-11-03: 125 mL

## 2019-11-03 NOTE — Progress Notes (Signed)
PHARMACY - TOTAL PARENTERAL NUTRITION CONSULT NOTE   Indication: Prolonged ileus  Patient Measurements: Height: '5\' 11"'  (180.3 cm) Weight: 90.3 kg (199 lb 1.2 oz) (removed all pillows and extra things from bed.) IBW/kg (Calculated) : 75.3 TPN AdjBW (KG): 93.6 Body mass index is 27.77 kg/m. Usual Weight: 90.7 kg   Assessment:  Pharmacy is consulted to start TPN in 86 yo male with prolonged ileus. Pt underwent exploratory laparotomy on 6/14 which revealed perforated duodenal ulcer.   Glucose / Insulin: hx of DM - on metformin PTA.  - CBGs < 150 with 60 units/bag insulin in TPN, rSSI q6h (range 97-135; goal 100-150) - 3 units of SS insulin required yesterday - Levemir 10 units BID per CCM started 6/18  Electrolytes: K (target >/= 4.0) is 4.1 . Magnesium 2.3 (target >/= 2.0), CCa 9.7; all lytes WNL Renal: 0.68, BUN 29 slightly elevated but stable. 3 L UOP last 24hr LFTs / TGs: AST/ALT 48/48 slightly elevated; T.bili, alk phos WNL. TG (65) remain WNL off of propofol. Prealbumin / albumin: prealbumin 8.3 (7/5) 9.6 (6/28), 9.0 (6/21); albumin 1.4 (7/6) Intake / Output; MIVF: [facility] at [facility]; NG output not charted; UOP 3L GI Imaging:  - 6/21 Upper GI series: small leak from superior margin of duodenal bulb. - 6/23 CT abdomen: 4.3 cm air-fluid collection in the right upper quadrant of the abdomen between the duodenal bulb and gallbladder,not containing enteric contrast. No residual leak from the duodenal bulb identified. - 7/1 Abdominal CT: large irregular high density noted most consistent with enlarging subcutaneous or IM hematoma Surgeries / Procedures:  - 6/14 Exploratory laparotomy which revealed perforated duodenal ulcer - 6/24 RUQ drain placed > 10 ml > cultured - 6/25 removal R abd JP drain   Central access:  PICC Line 6/15 TPN start date: 6/15  Nutritional Goals: per RD recommendation on 6/30: Kcal:  2200-2400 Protein:  115-125 grams Fluid:  >2L/d  Goal TPN rate is 95 mL/hr  (provides 121 g of protein and 2262 kcals per day)  Current Nutrition:  NPO   Plan: No changes to TPN formula  Continue TPN at 95 mL/hr at 1800, goal rate   Electrolytes in TPN: No changes  85 mEq/L of Na, 85 mEq/L of K , 4 mEq/L of Ca,15 mEq/L of Magnesium, and 15 mmol/L of Phos. Cl:Ac ratio max Ac  Standard MVI and trace elements to TPN  Continue resistant q6h SSI, levemir and adjust as needed   Continue 60 units of regular insulin/day in TPN   Continue M[facility]F at [facility] mL/hr   Monitor TPN labs on Mon/Thurs  Lenis Noon, PharmD 11/03/19 8:17 AM

## 2019-11-03 NOTE — Progress Notes (Signed)
Daily Progress Note   Patient Name: Matthew Schwartz       Date: 11/03/2019 DOB: 10/11/1932  Age: 86 y.o. MRN#: [id number] Attending Physician: Nita Sells, MD Primary Care Physician: [name], MD Admit Date: 10/12/2019  Reason for Consultation/Follow-up: Establishing goals of care  Subjective: I saw and examined Matthew Schwartz today and spoke with his daughter at the bedside.     We discussed about pain management and disposition options along with broad goals of care.    Length of Stay: 22  Current Medications: Scheduled Meds:  . chlorhexidine  15 mL Mouth Rinse BID  . Chlorhexidine Gluconate Cloth  6 each Topical Daily  . cloNIDine  0.2 mg Transdermal Weekly  . insulin aspart  0-20 Units Subcutaneous Q6H  . insulin detemir  10 Units Subcutaneous BID  . lidocaine  1 patch Transdermal Q24H  . mouth rinse  15 mL Mouth Rinse q12n4p  . metoprolol tartrate  5 mg Intravenous Q4H  . nitroGLYCERIN  1 inch Topical Q6H  . pantoprazole (PROTONIX) IV  40 mg Intravenous Q12H  . sodium chloride flush  10-40 mL Intracatheter Q12H  . sodium chloride flush  5 mL Intracatheter Q8H  . sucralfate  1 g Oral BID    Continuous Infusions: . sodium chloride Stopped (10/30/19 1747)  . sodium chloride Stopped (10/31/19 2220)  . fluconazole (DIFLUCAN) IV Stopped (11/02/19 2017)  . heparin 1,400 Units/hr (11/03/19 1230)  . meropenem (MERREM) IV 1 g (11/03/19 1500)  . TPN ADULT (ION) 95 mL/hr at 11/03/19 1230  . TPN ADULT (ION)      PRN Meds: sodium chloride, bisacodyl, fentaNYL (SUBLIMAZE) injection, hydrALAZINE, ipratropium-albuterol, lip balm, LORazepam, ondansetron (ZOFRAN) IV, silver nitrate applicators, sodium chloride flush  Physical Exam         Elderly male resting in bed Has NG tube  with suction Has some dependent edema.  Hands swollen. Regular work of breathing S1-S2 Abdomen not distended  Vital Signs: BP (!) 156/60   Pulse 97   Temp 98.2 F (36.8 C) (Oral)   Resp (!) 27   Ht '5\' 11"'  (1.803 m)   Wt 90.3 kg Comment: removed all pillows and extra things from bed.  SpO2 97%   BMI 27.77 kg/m  SpO2: SpO2: 97 % O2 Device: O2 Device: Room Air O2 Flow Rate:  O2 Flow Rate (L/min): 2 L/min  Intake/output summary:   Intake/Output Summary (Last 24 hours) at 11/03/2019 1616 Last data filed at 11/03/2019 1230 Gross per 24 hour  Intake 3265.69 ml  Output 2357 ml  Net 908.69 ml   LBM: Last BM Date: 11/01/19 Baseline Weight: Weight: 96.4 kg Most recent weight: Weight: 90.3 kg (removed all pillows and extra things from bed.)       Palliative Assessment/Data:    Flowsheet Rows     Most Recent Value  Intake Tab  Referral Department Hospitalist  Unit at Time of Referral ICU  Date Notified 10/23/19  Palliative Care Type New Palliative care  Reason for referral Clarify Goals of Care  Date of Admission 10/12/19  Date first seen by Palliative Care 10/24/19  # of days Palliative referral response time 1 Day(s)  # of days IP prior to Palliative referral 11  Clinical Assessment  Psychosocial & Spiritual Assessment  Palliative Care Outcomes     Palliative performance scale 30% Patient Active Problem List   Diagnosis Date Noted  . Epigastric pain   . Palliative care by specialist   . Goals of care, counseling/discussion   . General weakness   . Acute abdominal pain   . Bilateral atelectasis 10/17/2019  . Acute respiratory insufficiency   . Elevated troponin level   . Acute respiratory failure with hypoxemia ([place])   . PVCs (premature ventricular contractions)   . Obstipation 10/12/2019  . HLD (hyperlipidemia) 10/12/2019  . Diabetes mellitus type 2, uncontrolled, with complications ([place]) 54/27/0623  . Perforated bowel ([place]) 10/12/2019  . Perforated duodenal  ulcer ([place]) 10/12/2019  . Anemia 09/07/2016  . Coronary artery disease due to lipid rich plaque 02/16/2015  . Hyperlipidemia 02/16/2015  . Malignant neoplasm of prostate ([place]) 02/18/2014  . HTN (hypertension) 06/14/2013  . Dyslipidemia 06/14/2013  . GERD (gastroesophageal reflux disease) 06/14/2013    Palliative Care Assessment & Plan   Patient Profile:    Assessment: 86 year old gentleman with coronary artery disease status post percutaneous intervention in the year 2000, hypertension, dyslipidemia, diabetes who was admitted with perforated duodenal ulcer and peritonitis. Cardiology has been following for NSVT, intermittent left bundle branch block and anterolateral T wave inversions as well as elevated troponin, at some point is supposed to undergo cardiac catheterization after acute issues resolved. General surgery also following.  Has NG tube.  To continue TPN.  Plan for another upper GI this week.  Recommendations/Plan:  Full code/Full scope care  Family is appreciative of TOC's correspondence in search for appropriate d/c options. His daughter reports that they have a lot of family support at home.  Pain: continue with IV tylenol and fentanyl to see if shorter acting medication controls pain but allows him to be more awake.    PMT will follow periodically, throughout the course of this hospitalization.    Goals of Care and Additional Recommendations:  Limitations on Scope of Treatment: Full Scope Treatment  Code Status:    Code Status Orders  (From admission, onward)         Start     Ordered   10/12/19 1208  Full code  Continuous        10/12/19 1212        Code Status History    Date Active Date Inactive Code Status Order ID Comments User Context   02/12/2014 1127 02/13/2014 0336 Full Code [id number]  Arne Cleveland, MD [place] Planning Activity    Advance Directive Documentation  Most Recent Value  Type of Advance Directive Healthcare  Power of Attorney, Living will  Pre-existing out of facility DNR order (yellow form or pink MOST form) --  "MOST" Form in Place? --       Prognosis:   Unable to determine Guarded, continue to monitor hospital course and overall disease trajectory.  Discharge Planning:  To Be Determined Recommend: Inpatient rehab, LTACH, or SNF rehab with palliative  Care plan was discussed with daughter at bedside. Also met with the patient's wife off the unit.   Thank you for allowing the Palliative Medicine Team to assist in the care of this patient.   Total Time 25 Prolonged Time Billed  no    Greater than 50%  of this time was spent counseling and coordinating care related to the above assessment and plan.  Loistine Chance, MD  Please contact Palliative Medicine Team phone at [phone] for questions and concerns.

## 2019-11-03 NOTE — Progress Notes (Signed)
Phosphorus and Magnesium level redrawn due to irregularly high readings on first draw. Back in expected range when repeated.

## 2019-11-03 NOTE — Progress Notes (Signed)
[facility] for Heparin Indication: chest pain/ACS, left upper extremity DVT  Allergies  Allergen Reactions  . Losartan Potassium Other (See Comments)    Unknown reaction that happened 15 years ago   Patient Measurements: Height: 5\' 11"  (180.3 cm) Weight: 90.3 kg (199 lb 1.2 oz) (removed all pillows and extra things from bed.) IBW/kg (Calculated) : 75.3 Heparin Dosing Weight: 90 kg  Vital Signs: Temp: 98.2 F (36.8 C) (07/06 1200) Temp Source: Oral (07/06 1200) BP: 156/60 (07/06 1200) Pulse Rate: 97 (07/06 1200)  Labs: Recent Labs     0000 11/01/19 0500 11/01/19 0645 11/01/19 1730 11/02/19 0540 11/02/19 1352 11/03/19 0001 11/03/19 0430 11/03/19 0521 11/03/19 1212  HGB   < > 8.7*  --   --  9.1*  --   --  8.0*  --   --   HCT  --  27.4*  --   --  28.7*  --   --  25.8*  --   --   PLT  --  351  --   --  367  --   --  284  --   --   HEPARINUNFRC  --   --   --    < >  --    < > 0.32 0.31  --  0.41  CREATININE  --   --  0.74  --  0.69  --   --   --  0.68  --    < > = values in this interval not displayed.   Estimated Creatinine Clearance: 73.2 mL/min (by C-G formula based on SCr of 0.68 mg/dL).  Assessment: 86 yo male with perforated duodenal ulcer s/p omental patch repair.  Pharmacy consulted to dose IV heparin for NSTEMI as well as for acute deep vein thrombosis involving the left axillary vein, surrounding the PICC line. Findings consistent with acute superficial vein thrombosis involving the left cephalic vein. Patient with anemia of critical illness/post-op s/p transfusion 6/27.   Significant events: 6/27: hold for Hgb 6.2 6/28: bloody stools, small amount of bleeding from surgical incision and JP drain  6/29: resume heparin 7/1: d/c heparin 2/2 hematoma at surgical site - of no consequence per surgery 7/2: d/w MD - continue to hold, reconsider resuming in next several days 7/3: resume heparin  Today, 11/03/2019  Confirmatory HL  = 0.41 remains therapeutic on heparin infusion of 1400 units/hr  CBC: Hgb (8) low and slightly decreased; Plt WNL  Confirmed with RN that heparin infusing at correct rate with no issues. Pt does have hematoma at surgical site - surgery aware, no worsening. No signs of overt bleeding.  Goal of Therapy:  Heparin level 0.3-0.7 units/ml Monitor platelets by anticoagulation protocol: Yes   Plan:   Continue heparin infusion at current rate of 1400 units/hr  Check HL, CBC with AM labs tomorrow and daily while on heparin infusion  Monitor for signs/symptoms of bleeding  Lenis Noon, PharmD 11/03/19 2:13 PM

## 2019-11-03 NOTE — Progress Notes (Addendum)
[facility] for Heparin Indication: chest pain/ACS, left upper extremity DVT  Allergies  Allergen Reactions  . Losartan Potassium Other (See Comments)    Unknown reaction that happened 15 years ago   Patient Measurements: Height: 5\' 11"  (180.3 cm) Weight: 93.6 kg (206 lb 5.6 oz) IBW/kg (Calculated) : 75.3 Heparin Dosing Weight: 94 kg  Vital Signs: Temp: 97.5 F (36.4 C) (07/06 0000) Temp Source: Oral (07/06 0000) BP: 187/77 (07/06 0000) Pulse Rate: 86 (07/06 0000)  Labs: Recent Labs    10/31/19 0245 11/01/19 0010 11/01/19 0500 11/01/19 0645 11/01/19 1730 11/02/19 0300 11/02/19 0540 11/02/19 1352 11/03/19 0001  HGB 9.1*  --  8.7*  --   --   --  9.1*  --   --   HCT 29.3*  --  27.4*  --   --   --  28.7*  --   --   PLT 394  --  351  --   --   --  367  --   --   HEPARINUNFRC  --    < >  --   --    < > 0.20*  --  0.27* 0.32  CREATININE 0.71  --   --  0.74  --   --  0.69  --   --    < > = values in this interval not displayed.   Estimated Creatinine Clearance: 80.3 mL/min (by C-G formula based on SCr of 0.69 mg/dL).  Medications:  Infusions:  . sodium chloride Stopped (10/30/19 1747)  . sodium chloride Stopped (10/31/19 2220)  . acetaminophen Stopped (11/03/19 0032)  . fluconazole (DIFLUCAN) IV Stopped (11/02/19 2017)  . heparin 1,400 Units/hr (11/03/19 0033)  . meropenem (MERREM) IV Stopped (11/02/19 2200)  . TPN ADULT (ION) 95 mL/hr at 11/03/19 0033  . vancomycin 1,250 mg (11/02/19 2204)   Assessment: 86 yo male with perforated duodenal ulcer s/p omental patch repair.  Pharmacy consulted to dose IV heparin for NSTEMI as well as for acute deep vein thrombosis involving the left axillary vein, surrounding the PICC line. Findings consistent with acute superficial vein thrombosis involving the left cephalic vein. Patient with anemia of critical illenss/post-op s/p transfusion 6/27.   Significant events: 6/27: hold for Hgb 6.2 6/28:  bloody stools, small amount of bleeding from surgical incision and JP drain  6/29: resume heparin 7/1: d/c heparin 2/2 hematoma at surgical site - of no consequence per surgery 7/2: d/w MD - continue to hold, reconsider resuming in next several days 7/3: resume heparin  Today, 11/03/2019  HL 0.32 on heparin infusion @ 1400 units/hr.  Level is therapeutic.  CBC- Hg low, pltc WNL (7/5)  No line issues & no further bleeding noted by RN  Goal of Therapy:  Heparin level 0.3-0.7 units/ml Monitor platelets by anticoagulation protocol: Yes   Plan:   Continue heparin infusion at 1400 units/hr   Daily Heparin level & CBC while on heparin  Continue to monitor for signs/symptoms of bleeding  Netta Cedars, PharmD, BCPS 11/03/2019 12:53 AM  Addendum: -Confirmatory heparin level remains therapeutic at lower end of goal range (0.31 on 1400 units/hr of heparin) and pt remains without bleeding or line issues.   -CBC: Hg 8.0 (low, decreased), Pltc WNL but decreased from yesterday.  -Continue current heparin rate.  F/U heparin level in am.  Netta Cedars, PharmD, BCPS 11/03/2019@5 :03 AM

## 2019-11-03 NOTE — Progress Notes (Signed)
Nutrition Follow-up  DOCUMENTATION CODES:   Not applicable  INTERVENTION:  - continue TPN regimen per Pharmacist. - diet advancement to occur as medically feasible.  NUTRITION DIAGNOSIS:   Inadequate oral intake related to inability to eat as evidenced by NPO status. -ongoing  GOAL:   Patient will meet greater than or equal to 90% of their needs -met with TPN regimen  MONITOR:   Diet advancement, Labs, Weight trends, Other (Comment) (TPN regimen)  ASSESSMENT:   Pt admitted with obstipation. PMH includes prostate cancer s/p radiation, HTN, HLD, GERD, T2DM.  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 His daughter was at bedside. Patient has NGT in place with canister marking making it appear that there has been 150 ml output this shift; RN confirms this.   He is receiving custom TPN @ goal rate of 95 ml/hr which is providing 2262 kcal and 121 grams protein. This is meeting 100% estimated needs.   Weight has been fluctuating over the past 11 days. Flow sheet documentation indicates mild edema to all extremities.   Palliative Care is following and last met with patient and family yesterday evening. He remains Full Code with ongoing desire for aggressive care  Per notes: - plan for repeat UGI today    Labs reviewed; CBG: 97 mg/dl, BUN: 29 mg/dl, Ca: 7.6 mg/dl, LFTs mildly elevated. Medications reviewed; sliding scale novolog, 10 units levemira BID, 40 mg IV protonix BID, 1 g carafate BID.   Diet Order:   Diet Order            Diet NPO time specified  Diet effective now                 EDUCATION  NEEDS:   Not appropriate for education at this time  Skin:  Skin Assessment: Skin Integrity Issues: Skin Integrity Issues:: Incisions Incisions: abdomen (6/14)  Last BM:  7/4  Height:   Ht Readings from Last 1 Encounters:  10/31/19 '5\' 11"'  (1.803 m)    Weight:   Wt Readings from Last 1 Encounters:  11/03/19 90.3 kg     Estimated Nutritional Needs:  Kcal:  2200-2400 Protein:  115-125 grams Fluid:  >2L/d     Jarome Matin, MS, RD, LDN, CNSC Inpatient Clinical Dietitian RD pager # available in [place]  After hours/weekend pager # available in [facility]

## 2019-11-03 NOTE — TOC Progression Note (Signed)
Transition of Care [facility]) - Progression Note    Patient Details  Name: Matthew Schwartz MRN: [id number] Date of Birth: October 22, 1932  Transition [facility] Care [facility]) CM/SW Contact  Leeroy Cha, RN Phone Number: 11/03/2019, 11:41 AM  Clinical Narrative:    Conversation with the daughter: Would like for her Father to go to [facility] if possible.  If not then [facility] for rehab.  Would also consider hospice at home if needed.  At this time family would like for him to go to [facility].   Expected Discharge Plan: IP Rehab Facility Barriers to Discharge: Continued Medical Work up  Expected Discharge Plan and Services Expected Discharge Plan: [place]   Discharge Planning Services: CM Consult   Living arrangements for the past 2 months: Single Family Home                                       Social Determinants of Health (SDOH) Interventions    Readmission Risk Interventions No flowsheet data found.

## 2019-11-03 NOTE — Progress Notes (Signed)
PROGRESS NOTE   Matthew Schwartz  OXB:[id number] DOB: Nov 22, 1932 DOA: 10/12/2019 PCP: [name], MD  Brief Narrative:  [name] NIDDM, HLD, GERD, irritable bowel syndrome (constipation) status post left knee replacement 2009, PTCA 2000-->RCA well as prior prostate CA, former smoker Admit 10/12/2019 severe abdominal pain-ex lap 10/12/2019-perforated duodenal ulcer and remained on vent NSVT postop-echo showed Systolic HF--was on pressors and cardiology consulted-eventually extubated 6/17 + fevers 6/23= postoperative abscess-IR  Placed abd perc drain 6/24--growing Candida /on diflucan On 6/30 upper GI series were performed-and repeated Unfortunately spiked a fever without cause-found to have on CT 7/1 enlarging hematoma General surgery felt this was minimal and not the cause for his fever He has not had high-grade fever since his Antibiotics were adjusted 7/1 vancomycin and Primaxin but continues to have low-grade Heparin was held temporarily until 7/3 and restarted cautiously 7/3 after risk-benefit discussion with family  He has stabilized to some degree on stepdown unit he has had some pain and swelling in his upper extremities likely from the prior DVT in his left upper extremity-his right arm was imaged for DVT and found to be negative He has a long road ahead and TOC is working with patient and family to figure out next steps in terms of placement he may be able to eventually go to an [place]:   Active Problems:   HTN (hypertension)   Malignant neoplasm of prostate (HCC)   Hyperlipidemia   Obstipation   HLD (hyperlipidemia)   Diabetes mellitus type 2, uncontrolled, with complications (HCC)   Perforated bowel ([place])   Perforated duodenal ulcer ([place])   Acute respiratory insufficiency   Elevated troponin level   Acute respiratory failure with hypoxemia (HCC)   PVCs (premature ventricular contractions)   Bilateral atelectasis   Acute abdominal pain    Palliative care by specialist   Goals of care, counseling/discussion   General weakness   Epigastric pain   1. Septic shock + peritonitis status post omental patch repair 6/14 a. UGI series 6/30 shows persisting small leak-repeat 7/6-defer to surgery management of diet and NG tube b. Fluid collections noted 6/23-drain placement 6/24 = Candida-Rx fluconazole at this time-Per IR drain study c. He had a T-max of 102 on 7/1 prompting change in antibiotics to meropenem in addition to his antifungal medications (vancomycin discontinued after 5 days 7/6) d. Many differentials for fever given abscess drain + possible intra-abdominal issues related to surgery- DDx atelectasis based on x-ray 7/3 e. D/W Dr. [name] 7/2 and he did not feel that the enlarging hematoma was of great significance or was of a source of infection as if it was infected this would drain into the midline wound that is present currently f. Leukocytosis transient g. Follow chest x-ray portable 7/6 2. Hypoxic respiratory failure extubated 6/17-NG tube in place with current atelectasis a. Seems euvolemic-keep fluids even b. Monitor trends 3. Acute systolic heart failure + NSTEMI, CAD 2000 a. Remains on beta-blocker and clonidine patch IV and home meds orally are held b. Cardiology input appreciated and they will follow peripherally 4. Left upper extremity DVT cephalic/axillary vein likely secondary to PICC line 5. Right upper extremity swelling and pain and discomfort a. Heparin discontinued on 7/1 after enlarging hematoma b. Resumed heparin without bolus 7/3 after discussions with specialists c. Hemoglobin has remained stable throughout this time since 7/1 d. Feel that the right upper extremity discomfort could also be from a DVT however ultrasound was done on right upper extremity  7/5 and this was negative for DVT e. Elevate both arms as possible up with therapy 6. DM TY 2 A1c 6.6 a. Not a diabetic prior to admission and  probably this is from his TPN b. Sliding scale coverage as needed-sugars 10 1-1 11 7. Anemia critical illness + transfusion 6/27 2 units PRBC a. Hemoglobin is stabilized in the 8-9 range after 2 units given on 6/27 b. Repeat labs in a.m. 8. Malignancy of prostate a. Outpatient screening follow-up and PSA 9. Former smoker   DVT prophylaxis:  Code Status: full Family Communication: Discussed with daughter 7/4  Disposition:   Status is: Inpatient will be transferring to progressive floor in the next several days  Remains inpatient appropriate because:Ongoing diagnostic testing needed not appropriate for outpatient work up   [place]:  Patient From: [place]  Planned Disposition: [place]  Expected discharge date: 11/27/19  Medically stable for discharge: No    Consultants:   General surgery  Cardiology  Procedures: None  Antimicrobials: IV Zosyn transitioned to meropenem and vancomycin 7/1 (total antibiotics so far since 6/14) IV Diflucan since 6/26   Subjective: Less sleepy for the past several hours was very talkative today Seems like rotation of opiates to fentanyl which is more short acting has helped him be more coherent He still complains of predominantly right hand pain but his left hand pain is better He is not having any shortness of breath and has no abdominal pain Upper GI study was done he has no difficulty with the tube and has had no reported blood out in the NG  Objective: Vitals:   11/03/19 0800 11/03/19 0900 11/03/19 1000 11/03/19 1200  BP:  (!) 160/66 (!) 182/96 (!) 156/60  Pulse:  84 92 97  Resp:  (!) 24 (!) 24 (!) 27  Temp: 98.2 F (36.8 C)   98.2 F (36.8 C)  TempSrc: Oral   Oral  SpO2:  96% 99% 97%  Weight:      Height:        Intake/Output Summary (Last 24 hours) at 11/03/2019 1514 Last data filed at 11/03/2019 1230 Gross per 24 hour  Intake 3683.16 ml  Output 2482 ml  Net 1201.16 ml   Filed Weights    10/30/19 0441 10/31/19 0400 11/03/19 0500  Weight: 86.8 kg 93.6 kg 90.3 kg    Examination:  General exam:  more coherent quite talkative Respiratory system:no rales no rhonchi-no rales and does not appear to be in distress-cannot appreciate any adventitious sounds Cardiovascular system: S1-S2 no murmur--sinus/sinus tach Gastrointestinal system: Bandage not disturbed nontender exam slight distension no rebound no guarding Central nervous system: Intact no focal deficit Extremities: Well perfused lower extremities no swelling Bilateral upper extremities are slightly swollen and red they are less tender than they have been in the past Skin: As above Psychiatry: Quiet affect  Data Reviewed: I have personally reviewed following labs and imaging studies BUN/creatinine 27/0.6-->29/0.68 Phosphorus and magnesium 2.7 and 2.3 White count 13.6-->9.9 Hemoglobin 8.0 Radiology Studies: DG UGI W SINGLE CM (SOL OR THIN BA)  Result Date: 11/03/2019 CLINICAL DATA:  86 year old male with history of perforated duodenal ulcer. EXAM: WATER SOLUBLE UPPER GI SERIES TECHNIQUE: Single-column upper GI series was performed using water soluble contrast. CONTRAST:  152mL OMNIPAQUE IOHEXOL 300 MG/ML  SOLN COMPARISON:  Upper GI 10/28/2019. FLUOROSCOPY TIME:  Fluoroscopy Time:  2 minutes and 36 seconds Radiation Exposure Index (if provided by the fluoroscopic device): 33.3 mGy FINDINGS: 125 mL of Omnipaque 300  was injected via the indwelling nasogastric tube. Upon adequate filling of the stomach, the patient was position in a RPO position allowing drainage through the pylorus into the duodenum. Upon repeated pyloric emptying, a small pool of contrast material was noted to be persistent in the region of the duodenum, which may represent some irregularity at the site of duodenal repair. However, at no time during the examination was there is extravasation of contrast material. Specifically, no definite residual fistulous tract  was identified between the duodenal bulb and the adjacent pigtail drainage catheter. IMPRESSION: 1. Mild irregularity of the duodenal bulb likely related to prior surgical repair. No definite persistent leak identified on today's examination. Electronically Signed   By: Vinnie Langton M.D.   On: 11/03/2019 10:33   VAS [place] UPPER EXTREMITY VENOUS DUPLEX  Result Date: 11/02/2019 UPPER VENOUS STUDY  Indications: Pain, and Swelling Anticoagulation: Heparin. Limitations: Line and patient pain tolerance, patient positioning. Comparison Study: 10/17/2019 - Left Axillary DVT Performing Technologist: Oliver Hum RVT  Examination Guidelines: A complete evaluation includes B-mode imaging, spectral Doppler, color Doppler, and power Doppler as needed of all accessible portions of each vessel. Bilateral testing is considered an integral part of a complete examination. Limited examinations for reoccurring indications may be performed as noted.  Right Findings: +----------+------------+---------+-----------+----------+-------+ RIGHT     CompressiblePhasicitySpontaneousPropertiesSummary +----------+------------+---------+-----------+----------+-------+ IJV           Full       Yes       Yes                      +----------+------------+---------+-----------+----------+-------+ Subclavian    Full       Yes       Yes                      +----------+------------+---------+-----------+----------+-------+ Axillary      Full       Yes       Yes                      +----------+------------+---------+-----------+----------+-------+ Brachial      Full       Yes       Yes                      +----------+------------+---------+-----------+----------+-------+ Radial        Full                                          +----------+------------+---------+-----------+----------+-------+ Ulnar         Full                                           +----------+------------+---------+-----------+----------+-------+ Cephalic      Full                                          +----------+------------+---------+-----------+----------+-------+ Basilic       Full                                          +----------+------------+---------+-----------+----------+-------+  Left Findings: +----------+------------+---------+-----------+----------+-------+ LEFT      CompressiblePhasicitySpontaneousPropertiesSummary +----------+------------+---------+-----------+----------+-------+ Subclavian    Full       Yes       Yes                      +----------+------------+---------+-----------+----------+-------+  Summary:  Right: No evidence of deep vein thrombosis in the upper extremity. No evidence of superficial vein thrombosis in the upper extremity.  Left: No evidence of thrombosis in the subclavian.  *See table(s) above for measurements and observations.  Diagnosing physician: Monica Martinez MD Electronically signed by Monica Martinez MD on 11/02/2019 at 4:49:26 PM.    Final      Scheduled Meds: . chlorhexidine  15 mL Mouth Rinse BID  . Chlorhexidine Gluconate Cloth  6 each Topical Daily  . cloNIDine  0.2 mg Transdermal Weekly  . insulin aspart  0-20 Units Subcutaneous Q6H  . insulin detemir  10 Units Subcutaneous BID  . lidocaine  1 patch Transdermal Q24H  . mouth rinse  15 mL Mouth Rinse q12n4p  . metoprolol tartrate  5 mg Intravenous Q4H  . nitroGLYCERIN  1 inch Topical Q6H  . pantoprazole (PROTONIX) IV  40 mg Intravenous Q12H  . sodium chloride flush  10-40 mL Intracatheter Q12H  . sodium chloride flush  5 mL Intracatheter Q8H  . sucralfate  1 g Oral BID   Continuous Infusions: . sodium chloride Stopped (10/30/19 1747)  . sodium chloride Stopped (10/31/19 2220)  . fluconazole (DIFLUCAN) IV Stopped (11/02/19 2017)  . heparin 1,400 Units/hr (11/03/19 1230)  . meropenem (MERREM) IV 1 g (11/03/19 1500)  . TPN ADULT  (ION) 95 mL/hr at 11/03/19 1230  . TPN ADULT (ION)       LOS: 22 days    Time spent: [place], MD [facility] To contact the attending provider between 7A-7P or the covering provider during after hours 7P-7A, please log into the web site [email or web address] and access using universal [place] password for that web site. If you do not have the password, please call the hospital operator.  11/03/2019, 3:14 PM

## 2019-11-03 NOTE — Progress Notes (Signed)
PT Cancellation Note  Patient Details Name: Pride Gonzales MRN: [id number] DOB: [birth year]   Cancelled Treatment:    Reason Eval/Treat Not Completed: Patient at procedure or test/unavailable. Will check back as schedule allows.   Claretha Cooper 11/03/2019, 9:22 AM  [place] Pager [phone] Office [phone]

## 2019-11-03 NOTE — Progress Notes (Addendum)
22 Days Post-Op    CC: Abdominal pain  Subjective: No significant change.  Scheduled for UGI today and difficult for him.  Midline incision is okay has a couple areas of hematoma that appear to be draining through.  Ongoing TPN therapy.  Objective: Vital signs in last 24 hours: Temp:  [97.4 F (36.3 C)-98.4 F (36.9 C)] 98.4 F (36.9 C) (07/06 0400) Pulse Rate:  [37-97] 71 (07/06 0600) Resp:  [18-28] 23 (07/06 0600) BP: (118-187)/(38-90) 137/53 (07/06 0600) SpO2:  [94 %-99 %] 97 % (07/06 0600) Weight:  [90.3 kg] 90.3 kg (07/06 0500) Last BM Date: 11/01/19 4025 IV 3052 urine 225 NG Drain 10 cc Afebrile vital signs are stable CMP is stable. WBC 9.9, H/H 8.0/28.8, platelets 284,000. CXR 10/31/2019 unchanged mild bilateral midlung atelectasis. Intake/Output from previous day: 07/05 0701 - 07/06 0700 In: 4025.1 [I.V.:2628.2; IV Piggyback:1396.9] Out: 1937 [Urine:3052; Emesis/NG output:225; Drains:10] Intake/Output this shift: No intake/output data recorded.  General appearance: alert, cooperative and no distress Resp: Breathing comfortably on nasal cannula, clear anterior GI: Soft, sore, midline incisions granulating in nicely.  2 small areas less than a centimeter in diameter essentially hematoma with some drainage coming through.  IR drain in place with cloudy drainage now on a bag.  Lab Results:  Recent Labs    11/02/19 0540 11/03/19 0430  WBC 13.6* 9.9  HGB 9.1* 8.0*  HCT 28.7* 25.8*  PLT 367 284    BMET Recent Labs    11/02/19 0540 11/03/19 0521  NA 137 136  K 4.1 4.1  CL 107 105  CO2 22 23  GLUCOSE 104* 135*  BUN 27* 29*  CREATININE 0.69 0.68  CALCIUM 8.0* 7.6*   PT/INR No results for input(s): LABPROT, INR in the last 72 hours.  Recent Labs  Lab 10/29/19 0345 10/31/19 0245 11/01/19 0645 11/02/19 0540 11/03/19 0521  AST 23 16 27  49* 48*  ALT 27 20 26  44 48*  ALKPHOS 61 56 64 86 82  BILITOT 0.7 0.8 0.7 0.6 0.7  PROT 6.8 7.0 6.9 7.1 6.2*   ALBUMIN 1.7* 1.6* 1.6* 1.5* 1.4*     Lipase     Component Value Date/Time   LIPASE 44 10/12/2019 0320     Medications: . chlorhexidine  15 mL Mouth Rinse BID  . Chlorhexidine Gluconate Cloth  6 each Topical Daily  . cloNIDine  0.2 mg Transdermal Weekly  . insulin aspart  0-20 Units Subcutaneous Q6H  . insulin detemir  10 Units Subcutaneous BID  . lidocaine  1 patch Transdermal Q24H  . mouth rinse  15 mL Mouth Rinse q12n4p  . metoprolol tartrate  5 mg Intravenous Q4H  . nitroGLYCERIN  1 inch Topical Q6H  . pantoprazole (PROTONIX) IV  40 mg Intravenous Q12H  . sodium chloride flush  10-40 mL Intracatheter Q12H  . sodium chloride flush  5 mL Intracatheter Q8H  . sucralfate  1 g Oral BID   . sodium chloride Stopped (10/30/19 1747)  . sodium chloride Stopped (10/31/19 2220)  . acetaminophen Stopped (11/03/19 0544)  . fluconazole (DIFLUCAN) IV Stopped (11/02/19 2017)  . heparin 1,400 Units/hr (11/03/19 9024)  . meropenem (MERREM) IV Stopped (11/03/19 0600)  . TPN ADULT (ION) 95 mL/hr at 11/03/19 0639  . TPN ADULT (ION)    . vancomycin Stopped (11/02/19 2334)   Anti-infectives (From admission, onward)   Start     Dose/Rate Route Frequency Ordered Stop   10/31/19 2200  vancomycin (VANCOREADY) IVPB 1250 mg/250 mL  Discontinue     1,250 mg 166.7 mL/hr over 90 Minutes Intravenous Every 12 hours 10/31/19 1602     10/29/19 2200  meropenem (MERREM) 1 g in sodium chloride 0.9 % 100 mL IVPB     Discontinue     1 g 200 mL/hr over 30 Minutes Intravenous Every 8 hours 10/29/19 2008     10/29/19 2200  vancomycin (VANCOCIN) IVPB 1000 mg/200 mL premix  Status:  Discontinued        1,000 mg 200 mL/hr over 60 Minutes Intravenous Every 12 hours 10/29/19 2008 10/31/19 1602   10/25/19 1800  fluconazole (DIFLUCAN) IVPB 400 mg     Discontinue     400 mg 100 mL/hr over 120 Minutes Intravenous Every 24 hours 10/24/19 1750     10/24/19 1830  fluconazole (DIFLUCAN) IVPB 800 mg  Status:   Discontinued        800 mg 200 mL/hr over 120 Minutes Intravenous  Once 10/24/19 1750 10/24/19 1755   10/24/19 1830  fluconazole (DIFLUCAN) IVPB 800 mg        800 mg 100 mL/hr over 240 Minutes Intravenous Every 24 hours 10/24/19 1756 10/24/19 2240   10/12/19 2000  piperacillin-tazobactam (ZOSYN) IVPB 3.375 g  Status:  Discontinued        3.375 g 12.5 mL/hr over 240 Minutes Intravenous Every 8 hours 10/12/19 1911 10/29/19 1945   10/12/19 1708  ceFAZolin (ANCEF) 2-4 GM/100ML-% IVPB       Note to Pharmacy: Bennye Alm   : cabinet override      10/12/19 1708 10/12/19 1849     Assessment/Plan NSTEMI/NSVT -Heparin drip/nitro ointment/beta-blocker Acute systolic HF - EF 16-10%/RUEAV 1 diastolic function DVT - LUE 10/17/19 Remote hx of prostate cancer s/p radiation HTN- off pressors HLD GERD- Protonix DM2 VDRF- extubated 6/17 PCCM sign off 6/25 VT-on amio drip/DCed 6/20 Hyperglycemia - SSI Hypokalemia  ABL anemia -Hgb 12.4 > 10.4.Hold chemical prophylaxis until hgb stabilized SevereProtein calorie malnutrition - Pre-alb < 5 >>9.6>>8.3 Anemia   Perforated Duodenal Ulcer S/p Exploratory laparotomy, omental patch repair of duodenal ulcer - Dr. 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 Bile in drain.Likely residual from contamination from perforation -TPN - Continue NGT. UGI with small leak 10/19/19>> repeat UGI 6/30 pending  -IR drain 6/20 -RUQ fluid collection  - UGI 6/30:  Small stump leak   - IR injection 7/2 - fistula tract to the duodenal bulb. Drain was switched from a suction bulb to a     gravity bag.  Contrast fills small irregular cavities around the drain but no large abscess cavity.  FEN -NPO, NGT, IVF, TPN VTE - SCDs, heparin drip ID -Zosyn6/14 - 10/29/19; Diflucan 6/26>> day 11; Meropenem 7/1>> day 6 Foley - in place Follow-Up - Dr. [name]  Plan: Upper GI today.  Ongoing TPN for  nutrition, IR drain with fistulous tract to the duodenum.      LOS: 22 days    Kimiah Hibner 11/03/2019 Please see Amion

## 2019-11-03 NOTE — Progress Notes (Signed)
OT Cancellation Note  Patient Details Name: Matthew Schwartz MRN: [id number] DOB: 1932/11/30   Cancelled Treatment:    Reason Eval/Treat Not Completed: Patient at procedure or test/ unavailable. Patient going off floor for upper GI upon attempt this AM. Will check back as schedule permits.  [name] OT Pager: [place] 11/03/2019, 10:50 AM

## 2019-11-04 DIAGNOSIS — K251 Acute gastric ulcer with perforation: Secondary | ICD-10-CM

## 2019-11-04 DIAGNOSIS — K259 Gastric ulcer, unspecified as acute or chronic, without hemorrhage or perforation: Secondary | ICD-10-CM

## 2019-11-04 DIAGNOSIS — R197 Diarrhea, unspecified: Secondary | ICD-10-CM

## 2019-11-04 DIAGNOSIS — I214 Non-ST elevation (NSTEMI) myocardial infarction: Secondary | ICD-10-CM

## 2019-11-04 DIAGNOSIS — E876 Hypokalemia: Secondary | ICD-10-CM

## 2019-11-04 DIAGNOSIS — K659 Peritonitis, unspecified: Secondary | ICD-10-CM

## 2019-11-04 DIAGNOSIS — A419 Sepsis, unspecified organism: Secondary | ICD-10-CM

## 2019-11-04 DIAGNOSIS — I82622 Acute embolism and thrombosis of deep veins of left upper extremity: Secondary | ICD-10-CM

## 2019-11-04 DIAGNOSIS — I4729 Other ventricular tachycardia: Secondary | ICD-10-CM

## 2019-11-04 DIAGNOSIS — I5021 Acute systolic (congestive) heart failure: Secondary | ICD-10-CM

## 2019-11-04 LAB — GLUCOSE, CAPILLARY
Glucose-Capillary: 115 mg/dL — ABNORMAL HIGH (ref 70–99)
Glucose-Capillary: 122 mg/dL — ABNORMAL HIGH (ref 70–99)
Glucose-Capillary: 118 mg/dL — ABNORMAL HIGH (ref 70–99)

## 2019-11-04 LAB — BASIC METABOLIC PANEL
Anion gap: 7 (ref 5–15)
BUN: 31 mg/dL — ABNORMAL HIGH (ref 8–23)
CO2: 24 mmol/L (ref 22–32)
Calcium: 7.6 mg/dL — ABNORMAL LOW (ref 8.9–10.3)
Chloride: 106 mmol/L (ref 98–111)
Creatinine, Ser: 0.8 mg/dL (ref 0.61–1.24)
GFR calc Af Amer: >60 mL/min (ref >60)
GFR calc non Af Amer: >60 mL/min (ref >60)
Glucose, Bld: 181 mg/dL — ABNORMAL HIGH (ref 70–99)
Potassium: 4.4 mmol/L (ref 3.5–5.1)
Sodium: 137 mmol/L (ref 135–145)

## 2019-11-04 LAB — CBC
HCT: 27.4 % — ABNORMAL LOW (ref 39.0–52.0)
Hemoglobin: 8.5 g/dL — ABNORMAL LOW (ref 13.0–17.0)
MCH: 26.4 pg (ref 26.0–34.0)
MCHC: 31 g/dL (ref 30.0–36.0)
MCV: 85.1 fL (ref 80.0–100.0)
Platelets: 373 10*3/uL (ref 150–400)
RBC: 3.22 MIL/uL — ABNORMAL LOW (ref 4.22–5.81)
RDW: 17.1 % — ABNORMAL HIGH (ref 11.5–15.5)
WBC: 9.9 10*3/uL (ref 4.0–10.5)
nRBC: 0 % (ref 0.0–0.2)

## 2019-11-04 LAB — MAGNESIUM: Magnesium: 2.5 mg/dL — ABNORMAL HIGH (ref 1.7–2.4)

## 2019-11-04 LAB — HEPARIN LEVEL (UNFRACTIONATED): Heparin Unfractionated: 0.43 IU/mL (ref 0.30–0.70)

## 2019-11-04 LAB — PHOSPHORUS: Phosphorus: 3.2 mg/dL (ref 2.5–4.6)

## 2019-11-04 MED ORDER — ACETAMINOPHEN 325 MG PO TABS: 650.0000 mg | ORAL_TABLET | ORAL | Status: DC | PRN

## 2019-11-04 MED ORDER — SIMETHICONE 80 MG PO CHEW
160.0000 mg | CHEWABLE_TABLET | Freq: Four times a day (QID) | ORAL | Status: DC
  Administered 2019-11-04 – 2019-12-01 (×65): 160 mg via ORAL
  Filled 2019-11-04 (×75): qty 2

## 2019-11-04 MED ORDER — ACETAMINOPHEN 650 MG RE SUPP
325.0000 mg | RECTAL | Status: DC | PRN
  Administered 2019-11-12 – 2019-11-13 (×3): 325 mg via RECTAL
  Filled 2019-11-04 (×5): qty 1

## 2019-11-04 MED ORDER — SIMETHICONE 40 MG/0.6ML PO SUSP
80.0000 mg | Freq: Once | ORAL | Status: AC
  Administered 2019-11-04: 80 mg via ORAL
  Filled 2019-11-04: qty 1.2

## 2019-11-04 MED ORDER — METOPROLOL TARTRATE 5 MG/5ML IV SOLN
7.5000 mg | INTRAVENOUS | Status: DC
  Administered 2019-11-04: 7.5 mg via INTRAVENOUS
  Filled 2019-11-04: qty 10

## 2019-11-04 MED ORDER — TRAVASOL 10 % IV SOLN
INTRAVENOUS | Status: AC
  Filled 2019-11-04: qty 1208.4

## 2019-11-04 MED ORDER — ACETAMINOPHEN 325 MG PO TABS
650.0000 mg | ORAL_TABLET | ORAL | Status: DC | PRN
  Administered 2019-11-04 – 2019-11-05 (×3): 650 mg via ORAL
  Filled 2019-11-04 (×3): qty 2

## 2019-11-04 MED ORDER — METOPROLOL TARTRATE 5 MG/5ML IV SOLN
10.0000 mg | INTRAVENOUS | Status: DC
  Administered 2019-11-04 – 2019-11-08 (×24): 10 mg via INTRAVENOUS
  Filled 2019-11-04 (×23): qty 10

## 2019-11-04 NOTE — Progress Notes (Signed)
[place] for Heparin Indication: chest pain/ACS, left upper extremity DVT  Allergies  Allergen Reactions  . Losartan Potassium Other (See Comments)    Unknown reaction that happened 15 years ago   Patient Measurements: Height: 5\' 11"  (180.3 cm) Weight: 91 kg (200 lb 9.9 oz) IBW/kg (Calculated) : 75.3 Heparin Dosing Weight: 90 kg  Vital Signs: Temp: 99.3 F (37.4 C) (07/07 0400) Temp Source: Axillary (07/07 0400) BP: 157/64 (07/07 0200) Pulse Rate: 88 (07/07 0200)  Labs: Recent Labs    11/02/19 0540 11/02/19 1352 11/03/19 0430 11/03/19 0521 11/03/19 1212 11/04/19 0609  HGB 9.1*  --  8.0*  --   --  8.5*  HCT 28.7*  --  25.8*  --   --  27.4*  PLT 367  --  284  --   --  373  HEPARINUNFRC  --    < > 0.31  --  0.41 0.43  CREATININE 0.69  --   --  0.68  --   --    < > = values in this interval not displayed.   Estimated Creatinine Clearance: 79.3 mL/min (by C-G formula based on SCr of 0.68 mg/dL).  Assessment: 86 yo male with perforated duodenal ulcer s/p omental patch repair.  Pharmacy consulted to dose IV heparin for NSTEMI as well as for acute deep vein thrombosis involving the left axillary vein, surrounding the PICC line. Findings consistent with acute superficial vein thrombosis involving the left cephalic vein. Patient with anemia of critical illness/post-op s/p transfusion 6/27.   Significant events: 6/27: hold for Hgb 6.2 6/28: bloody stools, small amount of bleeding from surgical incision and JP drain  6/29: resume heparin 7/1: d/c heparin 2/2 hematoma at surgical site - of no consequence per surgery 7/2: d/w MD - continue to hold, reconsider resuming in next several days 7/3: resume heparin  Today, 11/04/2019  HL = 0.43 remains therapeutic on heparin infusion of 1400 units/hr  CBC: Hgb (8.5) low and relatively stable; Plt WNL  No issue with IV infusion reported. Pt does have hematoma at surgical site - surgery aware, no  worsening. No signs of overt bleeding.  Goal of Therapy:  Heparin level 0.3-0.7 units/ml Monitor platelets by anticoagulation protocol: Yes   Plan:   Continue heparin infusion at current rate of 1400 units/hr  Check HL, CBC with AM labs tomorrow and daily while on heparin infusion  Monitor for signs/symptoms of bleeding  Peggyann Juba, PharmD, BCPS Pharmacy: [phone] 11/04/19 7:24 AM

## 2019-11-04 NOTE — Progress Notes (Signed)
23 Days Post-Op    JO:[facility] pain  Subjective: No significant difference from yesterday.  He did do well with the upper GI which shows no leak currently.  He has changes in the duodenum and fistulogram shows a sinus tract in the duodenum where the IR drain is.  His midline incision looks pretty good there are couple spots where there is drainage of an underlying hematoma.  See the picture below.  He was placed on Diflucan and meropenem after transfer to the floor and a fever of 102.  CT on 7/1 showed large irregular density noted anterior to the midline surgical incision consistent with a hematoma.  Objective: Vital signs in last 24 hours: Temp:  [97.7 F (36.5 C)-99.3 F (37.4 C)] 98.5 F (36.9 C) (07/07 0800) Pulse Rate:  [64-97] 87 (07/07 0800) Resp:  [17-29] 24 (07/07 0800) BP: (123-198)/(53-96) 198/64 (07/07 0800) SpO2:  [94 %-99 %] 96 % (07/07 0800) Weight:  [91 kg] 91 kg (07/07 0500) Last BM Date: 11/03/19 3191 IV/TPN Urine 1200 Drain 0 Stool x 3 TM 99.3BP up  RR mid 20's . sodium chloride Stopped (10/30/19 1747)  . sodium chloride Stopped (10/31/19 2220)  . fluconazole (DIFLUCAN) IV Stopped (11/03/19 1903)  . heparin 1,400 Units/hr (11/04/19 0650)  . meropenem (MERREM) IV Stopped (11/04/19 9470)  . TPN ADULT (ION) 95 mL/hr at 11/04/19 0650  . TPN ADULT (ION)     Mag 2.5/Phos 3.2 WBC 9.9 H/H 8.5/27.4 UGI 7/6: no leak IR sinus fistula tube check 7/2: Positive for a fistula tract to the duodenal bulb Intake/Output from previous day: 07/06 0701 - 07/07 0700 In: 3191.2 [I.V.:2613.8; IV Piggyback:577.4] Out: 1200 [Urine:1200] Intake/Output this shift: No intake/output data recorded.  General appearance: alert, cooperative, no distress and frail Resp: clear to auscultation bilaterally and anterior exam GI: soft, ? mild distension, open midline below.  IR drain still a green cloudy fluid     Lab Results:  Recent Labs    11/03/19 0430 11/04/19 0609  WBC 9.9  9.9  HGB 8.0* 8.5*  HCT 25.8* 27.4*  PLT 284 373    BMET Recent Labs    11/02/19 0540 11/03/19 0521  NA 137 136  K 4.1 4.1  CL 107 105  CO2 22 23  GLUCOSE 104* 135*  BUN 27* 29*  CREATININE 0.69 0.68  CALCIUM 8.0* 7.6*   PT/INR No results for input(s): LABPROT, INR in the last 72 hours.  Recent Labs  Lab 10/29/19 0345 10/31/19 0245 11/01/19 0645 11/02/19 0540 11/03/19 0521  AST 23 16 27  49* 48*  ALT 27 20 26  44 48*  ALKPHOS 61 56 64 86 82  BILITOT 0.7 0.8 0.7 0.6 0.7  PROT 6.8 7.0 6.9 7.1 6.2*  ALBUMIN 1.7* 1.6* 1.6* 1.5* 1.4*     Lipase     Component Value Date/Time   LIPASE 44 10/12/2019 0320     Medications: . chlorhexidine  15 mL Mouth Rinse BID  . Chlorhexidine Gluconate Cloth  6 each Topical Daily  . cloNIDine  0.2 mg Transdermal Weekly  . insulin aspart  0-20 Units Subcutaneous Q6H  . insulin detemir  10 Units Subcutaneous BID  . lidocaine  1 patch Transdermal Q24H  . mouth rinse  15 mL Mouth Rinse q12n4p  . metoprolol tartrate  7.5 mg Intravenous Q4H  . nitroGLYCERIN  1 inch Topical Q6H  . pantoprazole (PROTONIX) IV  40 mg Intravenous Q12H  . sodium chloride flush  10-40 mL Intracatheter Q12H  .  sodium chloride flush  5 mL Intracatheter Q8H  . sucralfate  1 g Oral BID    Assessment/Plan NSTEMI/NSVT-Heparin drip/nitro ointment/beta-blocker Acute systolic HF - EF 84-69%/GEXBM 1 diastolic function DVT - LUE 10/17/19 Remote hx of prostate cancer s/p radiation HTN- off pressors HLD GERD- Protonix DM2 VDRF-extubated 6/17 PCCM sign off 6/25 VT-on amio drip/DCed 6/20 Hyperglycemia - SSI Hypokalemia  ABL anemia -Hgb 12.4 > 10.4.Hold chemical prophylaxis until hgb stabilized SevereProtein calorie malnutrition - Pre-alb < 5 >>9.6>>8.3 Anemia   Perforated Duodenal Ulcer S/p Exploratory laparotomy, omental patch repair of duodenal ulcer - Dr. 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 Bile in drain.Likely residual from contamination from perforation -TPN - Continue NGT.UGI with small leak 10/19/19>> repeat UGI 6/30 pending -IR drain 6/20-RUQ fluid collection  - UGI 6/30:  Small stump leak   - IR injection 7/2 - fistula tract to the duodenal bulb. Drain was switched from a suction bulb to a     gravity bag.  Contrast fills small irregular cavities around the drain but no large abscess cavity.  - UGI 7/6:  Mild irregularity of the duodenal bulb likely related to prior surgical repair. No definite                      persistent leak identified on today's examination.  FEN -NPO, NGT, IVF, TPN VTE - SCDs, heparindrip ID -Zosyn6/14 - 10/29/19;Diflucan 6/26>>day 10; Meropenem 7/1>> day 7 Foley - in place Follow-Up - Dr. [name]  Plan:  Continue current Rx, discuss length of antibiotic and diflucan.  He is having loose stools.  Continue NPO/NG/TPN and allow duodenal tract to seal.        LOS: 23 days    Matthew Schwartz 11/04/2019 Please see Amion

## 2019-11-04 NOTE — Progress Notes (Addendum)
RN noting increased runs of V-tach on monitor. Upon review, runs seem to be 4-7 beats at a time. Cardiology paged and aware of situation- no new orders at this time.

## 2019-11-04 NOTE — Progress Notes (Signed)
PHARMACY - TOTAL PARENTERAL NUTRITION CONSULT NOTE   Indication: Prolonged ileus  Patient Measurements: Height: _0  (180.3 cm) Weight: 91 kg (200 lb 9.9 oz) IBW/kg (Calculated) : 75.3 TPN AdjBW (KG): 93.6 Body mass index is 27.98 kg/m. Usual Weight: 90.7 kg   Assessment:  Pharmacy is consulted to start TPN in 86 yo male with prolonged ileus. Pt underwent exploratory laparotomy on 6/14 which revealed perforated duodenal ulcer.   Glucose / Insulin: hx of DM - on metformin PTA.  - CBGs < 150 with 60 units/bag insulin in TPN, rSSI q6h (range 101-115; goal 100-150) - 3 units of SS insulin required yesterday - Levemir 10 units BID per CCM started 6/18  Electrolytes: K (target >/= 4.0) is 4.1 yesterday . Magnesium 2.5 (target >/= 2.0), CCa 9.7 yesterday; all lytes WNL yesterday Renal: SCr 0.68, BUN 29 slightly elevated but stable. 3 L UOP last 24hr LFTs / TGs: AST/ALT 48/48 slightly elevated; T.bili, alk phos WNL. TG (65) remain WNL off of propofol. Prealbumin / albumin: prealbumin 8.3 (7/5) 9.6 (6/28), 9.0 (6/21); albumin 1.4 (7/6) Intake / Output; [facility]F: [facility]; NG output not charted; UOP 1.2L GI Imaging:  - 6/21 Upper GI series: small leak from superior margin of duodenal bulb. - 6/23 CT abdomen: 4.3 cm air-fluid collection in the right upper quadrant of the abdomen between the duodenal bulb and gallbladder,not containing enteric contrast. No residual leak from the duodenal bulb identified. - 7/1 Abdominal CT: large irregular high density noted most consistent with enlarging subcutaneous or IM hematoma - 7/6 Upper GI showed no leak.  Will need repeat sinogram without fistula to start oral feeds to maximize chance of this healing.   Surgeries / Procedures:  - 6/14 Exploratory laparotomy which revealed perforated duodenal ulcer - 6/24 RUQ drain placed > 10 ml > cultured - 6/25 removal R abd JP drain   Central access:  PICC Line 6/15 TPN start date: 6/15  Nutritional Goals: per RD  recommendation on 6/30: Kcal:  2200-2400 Protein:  115-125 grams Fluid:  >2L/d  Goal TPN rate is 95 mL/hr (provides 121 g of protein and 2262 kcals per day)  Current Nutrition:  NPO   Plan: at 18:00 tonight  Continue TPN at 95 mL/hr at 1800, goal rate   Electrolytes in TPN:   85 mEq/L of Na, 85 mEq/L of K , 4 mEq/L of Ca, decrease 10 mEq/L of Magnesium, and 15 mmol/L of Phos. Cl:Ac ratio max Ac  Standard MVI and trace elements to TPN  Continue resistant q6h SSI, levemir and adjust as needed   Continue 60 units of regular insulin/day in TPN   Continue MIVF at [facility] mL/hr   Monitor TPN labs on Mon/Thurs  Peggyann Juba, PharmD, BCPS Pharmacy: [phone] 11/04/19 7:30 AM

## 2019-11-04 NOTE — Progress Notes (Signed)
Tonight, pt has only achieved two hours of sleep earlier in shift after Ativan and Melatonin given. Aromatherapy also attempted to no relief for comfort/sleep. Patient c/o pain in legs, hands, and abdomen. Patient anxious and praying to God for rest and comfort. Providing as much emotional support as is possible to alleviate emotional, spiritual, and mental sources of discomfort/pain. Encouraged activity during day time including participation in therapy services to reorient patient back to normal circadian rhythm. RN requesting further discussion on this from physicians with family and patient as current methods ineffective for overall wellbeing.

## 2019-11-04 NOTE — Progress Notes (Signed)
Occupational Therapy Treatment Patient Details Name: Matthew Schwartz MRN: [id number] DOB: Apr 18, 1933 Today's Date: 11/04/2019    History of present illness Pt s/p exploratory lap (10/12/19) to repair perforated duodenal ulcer and then on vent with extubation 10/14/19.  Pt with acute respiratory failure with hypoxia and acute systolic CHF in setting of sepsis and peritonitis.  Pt with hx of DM, prostate CA and bil TKR   OT comments  Upon arrival patient with loose stool although nursing staff had just finished with bed bath/peri care. Patient max A x2 with cues to assist with bed mobility to perform peri care. Attempted sitting EOB this session, patient require max to total A x2 transitioning from side-lying to sitting and dependent in trunk support with patient trying to lay back onto head of bed therefore quickly returned to semi-supine. Provided encouragement and reassurance at end of session that therapy would continue to work with patient, appears is low spirits.    Follow Up Recommendations  LTACH;SNF;Supervision/Assistance - 24 hour    Equipment Recommendations  Other (comment) (TBD)       Precautions / Restrictions Precautions Precautions: Fall Precaution Comments: 1 right drain, abd, NG tube, may have loose BM Restrictions Weight Bearing Restrictions: No       Mobility Bed Mobility Overal bed mobility: Needs Assistance Bed Mobility: Rolling;Sidelying to Sit Rolling: Max assist;+2 for safety/equipment Sidelying to sit: Total assist;+2 for physical assistance;+2 for safety/equipment       General bed mobility comments: multimodal cues and attempt to transition from sidelying to sitting however patient dependent in trunk support and trying to lay himself back onto [facility] therefore quickly returned to semi-supine  Transfers                 General transfer comment: unable        ADL either performed or assessed with clinical judgement   ADL   Eating/Feeding: Total  assistance;Bed level Eating/Feeding Details (indicate cue type and reason): feed ice chip                         Toileting- Clothing Manipulation and Hygiene: Total assistance;+2 for physical assistance;+2 for safety/equipment;Bed level Toileting - Clothing Manipulation Details (indicate cue type and reason): patient with loose stool after nursing staff just cleaned patient up     Functional mobility during ADLs: Maximal assistance;+2 for physical assistance;Total assistance;+2 for safety/equipment General ADL Comments: limited by pain, fatigue, patient does appear to have a depressed affect stating "I can't do it"                Cognition Arousal/Alertness: Awake/alert Behavior During Therapy: Flat affect Overall Cognitive Status: Within Functional Limits for tasks assessed                                 General Comments: patient more interactive and conversive with therapists however does require repeated cues to assist with bed mobility        Exercises General Exercises - Upper Extremity Shoulder Flexion: AROM;5 reps;Both;Supine Elbow Flexion: AROM;Both;5 reps;Supine Elbow Extension: AROM;Both;5 reps;Supine Digit Composite Flexion: AAROM;Both;5 reps;Supine Composite Extension: AAROM;Both;5 reps;Supine           Pertinent Vitals/ Pain       Pain Assessment: Faces Faces Pain Scale: Hurts whole lot Pain Location: buttock and abdomen with mobility Pain Descriptors / Indicators: Sore;Grimacing;Guarding;Moaning Pain Intervention(s): Monitored during session;Repositioned  Frequency  Min 2X/week        Progress Toward Goals  OT Goals(current goals can now be found in the care plan section)  Progress towards OT goals: Not progressing toward goals - comment (prolonged time in bed d/t medical complications)  Acute Rehab OT Goals Patient Stated Goal: be more independent OT Goal Formulation: With patient/family Time For Goal  Achievement: 11/12/19 Potential to Achieve Goals: Fair ADL Goals Pt Will Perform Eating: bed level;with supervision;sitting Pt Will Perform Grooming: bed level;with supervision;sitting Pt Will Perform Upper Body Bathing: with min guard assist;bed level;sitting Pt Will Perform Upper Body Dressing: with min guard assist;bed level;sitting Pt Will Transfer to Toilet: with max assist;with +2 assist;squat pivot transfer;bedside commode Pt Will Perform Toileting - Clothing Manipulation and hygiene: with max assist;sit to/from stand;sitting/lateral leans Pt/caregiver will Perform Home Exercise Program: Increased strength;Both right and left upper extremity  Plan Discharge plan needs to be updated    Co-evaluation    PT/OT/SLP Co-Evaluation/Treatment: Yes Reason for Co-Treatment: Complexity of the patient's impairments (multi-system involvement);For patient/therapist safety;To address functional/ADL transfers PT goals addressed during session: Mobility/safety with mobility OT goals addressed during session: ADL's and self-care      AM-PAC OT "6 Clicks" Daily Activity     Outcome Measure   Help from another person eating meals?: Total Help from another person taking care of personal grooming?: A Lot Help from another person toileting, which includes using toliet, bedpan, or urinal?: Total Help from another person bathing (including washing, rinsing, drying)?: Total Help from another person to put on and taking off regular upper body clothing?: Total Help from another person to put on and taking off regular lower body clothing?: Total 6 Click Score: 7    End of Session  OT Visit Diagnosis: Muscle weakness (generalized) (M62.81);Pain Pain - part of body:  (abdomen, buttock)   Activity Tolerance Patient limited by pain   Patient Left in bed;with call bell/phone within reach   Nurse Communication Mobility status        Time: 1610-9604 OT Time Calculation (min): 25 min  Charges: OT  General Charges $OT Visit: 1 Visit OT Treatments $Self Care/Home Management : 8-22 mins  [name] OT Pager: [phone]   Rosemary Holms 11/04/2019, 10:57 AM

## 2019-11-04 NOTE — Progress Notes (Signed)
Physical Therapy Treatment Patient Details Name: Matthew Schwartz MRN: [id number] DOB: [birth year] Today's Date: 11/04/2019    History of Present Illness Pt s/p exploratory lap (10/12/19) to repair perforated duodenal ulcer and then on vent with extubation 10/14/19.  Pt with acute respiratory failure with hypoxia and acute systolic CHF in setting of sepsis and peritonitis.  Pt with hx of DM, prostate CA and bil TKR    PT Comments    Patient indicating that he does no want to sit up. Assisted with rolling and changing bed pad. Attempted to move patient with 2 total assist to sitting on bed edge. The patient made it as far a s legs over and then began to return to side.Patient placed in partial bed/chair position, indicating right hip pain. Patient's pregress has digressed with multiple issues. continue PT efforts as patient tolerates.   Follow Up Recommendations  SNF     Equipment Recommendations  None recommended by PT    Recommendations for Other Services       Precautions / Restrictions Precautions Precautions: Fall Precaution Comments: 1 right drain, abd, NG tube, may have loose BM Restrictions Weight Bearing Restrictions: No    Mobility  Bed Mobility Overal bed mobility: Needs Assistance Bed Mobility: Rolling;Sidelying to Sit Rolling: Max assist;+2 for safety/equipment Sidelying to sit: Total assist;+2 for physical assistance;+2 for safety/equipment       General bed mobility comments: multimodal cues and attempt to transition from sidelying to sitting however patient dependent in trunk support and trying to lay himself back onto [facility] therefore quickly returned to semi-supine  Transfers                 General transfer comment: unable  Ambulation/Gait                 Stairs             Wheelchair Mobility    Modified Rankin (Stroke Patients Only)       Balance                                            Cognition  Arousal/Alertness: Awake/alert Behavior During Therapy: Flat affect Overall Cognitive Status: Within Functional Limits for tasks assessed                                 General Comments: patient more interactive and conversive with therapists however does require repeated cues to assist with bed mobility      Exercises General Exercises - Upper Extremity Shoulder Flexion: AROM;5 reps;Both;Supine Elbow Flexion: AROM;Both;5 reps;Supine Elbow Extension: AROM;Both;5 reps;Supine Digit Composite Flexion: AAROM;Both;5 reps;Supine Composite Extension: AAROM;Both;5 reps;Supine    General Comments        Pertinent Vitals/Pain Pain Assessment: Faces Faces Pain Scale: Hurts whole lot Pain Location: buttock and abdomen with mobility, right hip Pain Descriptors / Indicators: Sore;Grimacing;Guarding;Moaning Pain Intervention(s): Limited activity within patient's tolerance;Monitored during session;Premedicated before session    Home Living                      Prior Function            PT Goals (current goals can now be found in the care plan section) Acute Rehab PT Goals Patient Stated Goal: to not move PT Goal Formulation: With patient Time  For Goal Achievement: 11/18/19 Potential to Achieve Goals: Poor Progress towards PT goals: Not progressing toward goals - comment;Goals downgraded-see care plan    Frequency    Min 2X/week      PT Plan Current plan remains appropriate    Co-evaluation PT/OT/SLP Co-Evaluation/Treatment: Yes Reason for Co-Treatment: Complexity of the patient's impairments (multi-system involvement);For patient/therapist safety;To address functional/ADL transfers PT goals addressed during session: Mobility/safety with mobility OT goals addressed during session: ADL's and self-care      AM-PAC PT "6 Clicks" Mobility   Outcome Measure  Help needed turning from your back to your side while in a flat bed without using bedrails?:  Total Help needed moving from lying on your back to sitting on the side of a flat bed without using bedrails?: Total Help needed moving to and from a bed to a chair (including a wheelchair)?: Total Help needed standing up from a chair using your arms (e.g., wheelchair or bedside chair)?: Total Help needed to walk in hospital room?: Total Help needed climbing 3-5 steps with a railing? : Total 6 Click Score: 6    End of Session   Activity Tolerance: Treatment limited secondary to medical complications (Comment);Patient limited by fatigue Patient left: in bed;with call bell/phone within reach;with nursing/sitter in room Nurse Communication: Mobility status;Need for lift equipment PT Visit Diagnosis: Difficulty in walking, not elsewhere classified (R26.2);Muscle weakness (generalized) (M62.81)     Time: 3664-4034 PT Time Calculation (min) (ACUTE ONLY): 29 min  Charges:  $Therapeutic Activity: 8-22 mins                     Tresa Endo PT Acute Rehabilitation Services Pager [phone] Office [phone]    Claretha Cooper 11/04/2019, 11:00 AM

## 2019-11-04 NOTE — Progress Notes (Signed)
Increased frequency of brief NSVT (max 6 beats). Hemodynamically compensated (hypertensive). Electrolytes OK. Will increase the beta blocker dose. Would recommend antiarrhythmics (amiodarone) if the VT becomes sustained or is associated with hemodynamic instability.

## 2019-11-04 NOTE — Progress Notes (Signed)
Patient c/o headache after nitroglycerin paste administered. Patient also stated he was disappointed with this nurse's care because I didn't make him sleep or resolve anything for him. Provided active listening, empathy, and explained RN role that follows MD orders. Encouraged patient to discuss sleep and pain issues with physicians.

## 2019-11-04 NOTE — Progress Notes (Signed)
PROGRESS NOTE    Matthew Schwartz  YQM:[id number] DOB: 07-19-1932 DOA: 10/12/2019 PCP: [name], MD   Chief Complaint  Patient presents with  . Abdominal Pain  . Vomiting    Brief Narrative:  [name] NIDDM, HLD, GERD, irritable bowel syndrome (constipation) status post left knee replacement 2009, PTCA 2000-->RCA well as prior prostate CA, former smoker Admit 10/12/2019 severe abdominal pain-ex lap 10/12/2019-perforated duodenal ulcer and remained on vent NSVT postop-echo showed Systolic HF--was on pressors and cardiology consulted-eventually extubated 6/17 + fevers 6/23= postoperative abscess-IR  Placed abd perc drain 6/24--growing Candida /on diflucan On 6/30 upper GI series were performed-and repeated Unfortunately spiked a fever without cause-found to have on CT 7/1 enlarging hematoma General surgery felt this was minimal and not the cause for his fever He has not had high-grade fever since his Antibiotics were adjusted 7/1 vancomycin and Primaxin but continues to have low-grade Heparin was held temporarily until 7/3 and restarted cautiously 7/3 after risk-benefit discussion with family  He has stabilized to some degree on stepdown unit he has had some pain and swelling in his upper extremities likely from the prior DVT in his left upper extremity-his right arm was imaged for DVT and found to be negative He has a long road ahead and TOC is working with patient and family to figure out next steps in terms of placement he may be able to eventually go to an [place]:   Principal Problem:   Severe sepsis with septic shock ([place]) Active Problems:   Perforated duodenal ulcer ([place])   Peritonitis ([place])   HTN (hypertension)   Malignant neoplasm of prostate ([place])   Hyperlipidemia   Obstipation   HLD (hyperlipidemia)   Diabetes mellitus type 2, uncontrolled, with complications ([place])   Perforated bowel ([place])   Acute respiratory insufficiency    Elevated troponin level   Acute respiratory failure with hypoxemia ([facility])   PVCs (premature ventricular contractions)   Bilateral atelectasis   Acute abdominal pain   Palliative care by specialist   Goals of care, counseling/discussion   General weakness   Epigastric pain   Gastric ulcer   Hypokalemia   NSVT (nonsustained ventricular tachycardia) ([place])   Diarrhea   NSTEMI (non-ST elevated myocardial infarction) ([place])   Acute deep vein thrombosis (DVT) of left upper extremity ([place])   Acute systolic CHF (congestive heart failure) ([place])  1 septic shock secondary to peritonitis secondary to perforated duodenal ulcer Status post expiratory laparotomy, omental patch repair of duodenal ulcer per Dr. 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  Currently afebrile.  Normal white count.  Patient noted to have upper GI series 10/28/2019 showing persistent small leak with repeat upper GI series done on 11/03/2018 one of the duodenal bulb likely related to prior surgical repair.  No definite persistent leak identified on today's examination.  Patient noted to have fluid collections 10/21/2019 with drain placement with cultures positive for Candida.  Status post drain placement.  On IV fluconazole.  Patient noted to have a T-max of [id number] with a change in antibiotics from IV vancomycin(s/p 5 days) to IV Merrem.  Blood cultures obtained with no growth to date.  Currently afebrile with a normal white count.  Dr. [name] discussed with general surgery, Dr. [name] on 10/30/2019 and was felt that enlarging hematoma was not source of infection as it would drain into the midline wound.  Continue IV PPI, bowel rest, NG tube, TPN.  Per general surgery.  2.  Acute hypoxic respiratory failure Status post extubation 10/15/2019.  NG tube in place.  Euvolemic.  Currently on room air with sats of 96%.  Follow.  3.  Acute systolic heart failure/non-STEMI/coronary artery disease  2000/NSVT Currently stable.  Patient on clonidine patch and IV beta-blocker.  Patient noted to have a 5-6 beat run of nonsustained V. tach however asymptomatic.  Potassium at 4.4.  Magnesium at 2.5.  Phosphorus at 3.2.  IV beta-blocker dose has been increased to 10 mg every 4 hours per cardiology.  Cardiology following peripherally.  4.  Left upper extremity DVT cephalic/axillary vein Likely secondary to PICC line.  On IV heparin.  Follow.  5.  Right upper extremity swelling and pain and discomfort Heparin initially discontinued on 10/29/2019 after enlarging hematoma.  Heparin was resumed without a bolus on 10/31/2019 after Dr. [name] discussed with a specialist.  Right upper extremity discomfort initially felt to be secondary to a DVT however Dopplers negative.  Hemoglobin currently stable at 8.5.  Follow H&H.  6.  Well-controlled diabetes mellitus type 2 Hemoglobin A1c 6.6.  Patient noted not to be a diabetic prior to admission and elevated CBGs likely secondary to TPN.  Sliding scale insulin.  7.  Anemia of critical illness Status post transfusion of 2 units packed red blood cells.  Hemoglobin currently stable at 8.5.  Follow H&H.  8.  Malignancy of the prostate Outpatient follow-up.  9.  Tobacco abuse Tobacco cessation.  10.  Diarrhea Per RN patient with 3 loose stools this morning.  Patient had about 3-4 loose stools yesterday.  Will monitor as patient is afebrile with a normal white count..  If increasing loose stools may need to consider checking for C. difficile as patient on IV antibiotics.    DVT prophylaxis: Heparin Code Status: Full Family Communication: Updated patient and daughter at bedside. Disposition:   Status is: Inpatient    Dispo:  Patient From: [place]  Planned Disposition: [facility]  Expected discharge date: 11/27/19  Medically stable for discharge: No patient currently n.p.o., on TPN, on IV antibiotics, being followed by  general surgery, significantly weak.        Consultants:   Palliative care: Dr. Rowe Pavy 10/24/2019  Cardiology: Dr. Margaretann Loveless 10/13/2019  General surgery: Dr. [name] 10/12/2019  Gastroenterology: Dr. Therisa Doyne 10/12/2019  PCCM: Dr. Halford Chessman 10/12/2019  Procedures:  CT abdomen and pelvis 10/21/2019, 10/29/2019  CT angiogram abdomen and pelvis 10/11/2019  Upper GI series 10/19/2019, 10/28/2019, 11/03/2019  2D echo 10/13/2019, 10/16/2019  Right upper extremity Doppler 11/02/2019  Left upper extremity Dopplers 10/17/2019  CT-guided drain placement right upper quadrant fluid collection per interventional radiology, Dr. Earleen Newport 10/22/2019  Exploratory laparotomy, omental patch repair of duodenal ulcer per Dr. [name] 10/12/2019  Right IJ CVL 10/12/2019>>>> 10/13/2019  Right radial A-line 10/12/2019>>>> 10/15/2019  ETT 10/12/2019>>>> 10/15/2019  Left upper extremity PICC 10/14/2019  Significant Hospital Events   6/14 Admit, exploratory laparotomy, omental patch repair of duodenal ulcer 6/16 Off vasopressors, less drainage from abd drain / clearing  6/17 extubated, weak cough, added high flow oxygen 6/18 start heparin gtt 6/20 d/c amiodarone 6/23 new fluid collection on CT abd > Drained by IR 6/24 To [facility] 6/25 PCCM sign off 7/01 back to ICU with fever, tachypnea >> PCCM asked to reassess 7/2comfortable this morning     Significant Diagnostic Tests:   CT angio abd/pelvis 6/13 >> mild atherosclerosis, no significant arterial stenosis of mesenteric vasculature, no findings of bowel ischemia,  colonic diverticulosis w/o diverticulitis, b/l renal cysts, 12 mm lesion upper pole Rt kidney  ECHO 6/15 >> poor image quality, appears to be regional wall motion variability but confident wall motion analysis is impossible even after contrast administration, LVEF ~ 27-74%, Grade I diastolic dysfunction, RV systolic function is moderately reduced, RV is mildly enlarged  6/19 venous doppler LUE>>acute deep  vein thrombosis involving the left axillary vein, surrounding the PICC line. Findings consistent with acute superficial vein thrombosis involving the left cephalic vein.  UGI 6/21 >> small leak from duodenum  CT ABD/Pelvis 6/23 >>interval midline abdominal incision with incomplete skin surgical closure, probable small hematomas within the anterior abdominal wall and the omentum. Nonspecific 4.3 cm air-fluid collection in the RUQ of the abdomen between the duodenal bulb and gallbladder, not containing enteric contrast. No residual leak from the duodenal bulb identified. New small bilateral pleural effusions with dependent L>R lower lobe opacities  Upper GI series 6/30 >>Small puckered stump of a leak at the site of the prior elongatedeak. Findings suggest resolving but persistent extraluminalcollection.  CT abd/pelvis 7/01 >>Anterior midline surgical incision is again noted. Large irregular high density is noted in this area most consistent with enlarging subcutaneous or intramuscular hematoma. Interval placement of pigtail drainage catheter in the area of fluid collection noted on prior exam in the right upper quadrant. Fluid collection is significantly decompressed compared to prior exam. Micro Data:  SARS CoV2 6/13 >> negative RUQ fluid collection 6/24 >> moderate Candida albicans Blood 6/25 >>negative Blood culture 10/29/2019>>> negative     Antimicrobials:   IV Merrem 10/29/2019  IV Zosyn 10/12/2019>>>> 10/29/2019  IV vancomycin 10/29/2019>>>> 11/03/2019  IV fluconazole 10/25/2019>>>>   Subjective: No SOB, No CP. C/o gaseous distension.Loose stools per RN.  Per RN patient noted to have a 5-6 beat run of nonsustained V. tach.  Objective: Vitals:   11/04/19 0600 11/04/19 0700 11/04/19 0800 11/04/19 1200  BP: (!) 175/68 (!) 183/68 (!) 198/64   Pulse: 91 81 87   Resp: (!) 21 (!) 26 (!) 24   Temp:   98.5 F (36.9 C) 97.9 F (36.6 C)  TempSrc:   Oral Oral  SpO2: 96% 97% 96%     Weight:      Height:        Intake/Output Summary (Last 24 hours) at 11/04/2019 1515 Last data filed at 11/04/2019 0650 Gross per 24 hour  Intake 2467.77 ml  Output 1200 ml  Net 1267.77 ml   Filed Weights   10/31/19 0400 11/03/19 0500 11/04/19 0500  Weight: 93.6 kg 90.3 kg 91 kg    Examination:  General exam: Appears calm and comfortable  Respiratory system: Clear to auscultation anterior lung fields.  No wheezes, no crackles, no rhonchi.Marland Kitchen Respiratory effort normal. Cardiovascular system: S1 & S2 heard, RRR. No JVD, murmurs, rubs, gallops or clicks. No pedal edema. Gastrointestinal system: Abdomen is distended. Hypoactive BS.  No rebound.  No guarding.  Central nervous system: Alert and oriented. No focal neurological deficits. Extremities: Symmetric 5 x 5 power. Skin: No rashes, lesions or ulcers Psychiatry: Judgement and insight appear normal. Mood & affect appropriate.     Data Reviewed: I have personally reviewed following labs and imaging studies  CBC: Recent Labs  Lab 10/29/19 0345 10/29/19 0345 10/29/19 2100 10/30/19 0441 10/31/19 0245 11/01/19 0500 11/02/19 0540 11/03/19 0430 11/04/19 0609  WBC 9.3   < > 10.0   < > 9.3 10.3 13.6* 9.9 9.9  NEUTROABS 6.4  --  7.4  --   --  7.6 10.3*  --   --   HGB 8.9*   < > 8.9*   < > 9.1* 8.7* 9.1* 8.0* 8.5*  HCT 28.1*   < > 28.2*   < > 29.3* 27.4* 28.7* 25.8* 27.4*  MCV 85.4   < > 85.5   < > 86.2 91.3 84.7 96.3 85.1  PLT 407*   < > 396   < > 394 351 367 284 373   < > = values in this interval not displayed.    Basic Metabolic Panel: Recent Labs  Lab 10/31/19 0245 10/31/19 0245 11/01/19 0645 11/02/19 0540 11/03/19 0430 11/03/19 0521 11/04/19 0609  NA 135  --  136 137  --  136 137  K 4.3  --  3.7 4.1  --  4.1 4.4  CL 103  --  105 107  --  105 106  CO2 24  --  24 22  --  23 24  GLUCOSE 138*  --  123* 104*  --  135* 181*  BUN 25*  --  24* 27*  --  29* 31*  CREATININE 0.71  --  0.74 0.69  --  0.68 0.80  CALCIUM  7.9*  --  7.8* 8.0*  --  7.6* 7.6*  MG 2.0   < > 2.2 2.2 4.6* 2.3 2.5*  PHOS 2.2*   < > 2.5 2.7 8.3* 2.7 3.2   < > = values in this interval not displayed.    GFR: Estimated Creatinine Clearance: 79.3 mL/min (by C-G formula based on SCr of 0.8 mg/dL).  Liver Function Tests: Recent Labs  Lab 10/29/19 0345 10/31/19 0245 11/01/19 0645 11/02/19 0540 11/03/19 0521  AST 23 16 27  49* 48*  ALT 27 20 26  44 48*  ALKPHOS 61 56 64 86 82  BILITOT 0.7 0.8 0.7 0.6 0.7  PROT 6.8 7.0 6.9 7.1 6.2*  ALBUMIN 1.7* 1.6* 1.6* 1.5* 1.4*    CBG: Recent Labs  Lab 11/03/19 1127 11/03/19 1819 11/03/19 2330 11/04/19 0546 11/04/19 1122  GLUCAP 101* 107* 129* 115* 122*     Recent Results (from the past 240 hour(s))  Culture, blood (single)     Status: None   Collection Time: 10/29/19  7:06 PM   Specimen: BLOOD  Result Value Ref Range Status   Specimen Description   Final    BLOOD RIGHT ANTECUBITAL Performed at [facility], [place] [address], [place], [place] [address]    Special Requests   Final    BOTTLES DRAWN AEROBIC ONLY Blood Culture results may not be optimal due to an inadequate volume of blood received in culture bottles Performed at [facility] [address], [place], [place] [address]    Culture   Final    NO GROWTH 5 DAYS Performed at [facility], [place] [address], [place], [place] [address]    Report Status 11/03/2019 FINAL  Final         Radiology Studies: DG CHEST PORT 1 VIEW  Result Date: 11/03/2019 CLINICAL DATA:  Aspiration. EXAM: PORTABLE CHEST 1 VIEW COMPARISON:  October 31, 2019. FINDINGS: The heart size and mediastinal contours are within normal limits. Both lungs are clear. Nasogastric tube is seen entering stomach. The visualized skeletal structures are unremarkable. IMPRESSION: No active disease. Electronically Signed   By: Marijo Conception M.D.   On: 11/03/2019 15:38   DG UGI W SINGLE CM (SOL OR THIN BA)  Result Date:  11/03/2019 CLINICAL DATA:  86 year old  male with history of perforated duodenal ulcer. EXAM: WATER SOLUBLE UPPER GI SERIES TECHNIQUE: Single-column upper GI series was performed using water soluble contrast. CONTRAST:  163mL OMNIPAQUE IOHEXOL 300 MG/ML  SOLN COMPARISON:  Upper GI 10/28/2019. FLUOROSCOPY TIME:  Fluoroscopy Time:  2 minutes and 36 seconds Radiation Exposure Index (if provided by the fluoroscopic device): 33.3 mGy FINDINGS: 125 mL of Omnipaque 300 was injected via the indwelling nasogastric tube. Upon adequate filling of the stomach, the patient was position in a RPO position allowing drainage through the pylorus into the duodenum. Upon repeated pyloric emptying, a small pool of contrast material was noted to be persistent in the region of the duodenum, which may represent some irregularity at the site of duodenal repair. However, at no time during the examination was there is extravasation of contrast material. Specifically, no definite residual fistulous tract was identified between the duodenal bulb and the adjacent pigtail drainage catheter. IMPRESSION: 1. Mild irregularity of the duodenal bulb likely related to prior surgical repair. No definite persistent leak identified on today's examination. Electronically Signed   By: Vinnie Langton M.D.   On: 11/03/2019 10:33   VAS [place] UPPER EXTREMITY VENOUS DUPLEX  Result Date: 11/02/2019 UPPER VENOUS STUDY  Indications: Pain, and Swelling Anticoagulation: Heparin. Limitations: Line and patient pain tolerance, patient positioning. Comparison Study: 10/17/2019 - Left Axillary DVT Performing Technologist: Oliver Hum RVT  Examination Guidelines: A complete evaluation includes B-mode imaging, spectral Doppler, color Doppler, and power Doppler as needed of all accessible portions of each vessel. Bilateral testing is considered an integral part of a complete examination. Limited examinations for reoccurring indications may be performed as noted.  Right  Findings: +----------+------------+---------+-----------+----------+-------+ RIGHT     CompressiblePhasicitySpontaneousPropertiesSummary +----------+------------+---------+-----------+----------+-------+ IJV           Full       Yes       Yes                      +----------+------------+---------+-----------+----------+-------+ Subclavian    Full       Yes       Yes                      +----------+------------+---------+-----------+----------+-------+ Axillary      Full       Yes       Yes                      +----------+------------+---------+-----------+----------+-------+ Brachial      Full       Yes       Yes                      +----------+------------+---------+-----------+----------+-------+ Radial        Full                                          +----------+------------+---------+-----------+----------+-------+ Ulnar         Full                                          +----------+------------+---------+-----------+----------+-------+ Cephalic      Full                                          +----------+------------+---------+-----------+----------+-------+  Basilic       Full                                          +----------+------------+---------+-----------+----------+-------+  Left Findings: +----------+------------+---------+-----------+----------+-------+ LEFT      CompressiblePhasicitySpontaneousPropertiesSummary +----------+------------+---------+-----------+----------+-------+ Subclavian    Full       Yes       Yes                      +----------+------------+---------+-----------+----------+-------+  Summary:  Right: No evidence of deep vein thrombosis in the upper extremity. No evidence of superficial vein thrombosis in the upper extremity.  Left: No evidence of thrombosis in the subclavian.  *See table(s) above for measurements and observations.  Diagnosing physician: Monica Martinez MD Electronically  signed by Monica Martinez MD on 11/02/2019 at 4:49:26 PM.    Final         Scheduled Meds: . chlorhexidine  15 mL Mouth Rinse BID  . Chlorhexidine Gluconate Cloth  6 each Topical Daily  . cloNIDine  0.2 mg Transdermal Weekly  . insulin aspart  0-20 Units Subcutaneous Q6H  . insulin detemir  10 Units Subcutaneous BID  . lidocaine  1 patch Transdermal Q24H  . mouth rinse  15 mL Mouth Rinse q12n4p  . metoprolol tartrate  10 mg Intravenous Q4H  . nitroGLYCERIN  1 inch Topical Q6H  . pantoprazole (PROTONIX) IV  40 mg Intravenous Q12H  . simethicone  160 mg Oral QID  . sodium chloride flush  10-40 mL Intracatheter Q12H  . sodium chloride flush  5 mL Intracatheter Q8H  . sucralfate  1 g Oral BID   Continuous Infusions: . sodium chloride Stopped (10/30/19 1747)  . sodium chloride Stopped (10/31/19 2220)  . fluconazole (DIFLUCAN) IV Stopped (11/03/19 1903)  . heparin 1,400 Units/hr (11/04/19 0650)  . meropenem (MERREM) IV 1 g (11/04/19 1453)  . TPN ADULT (ION) 95 mL/hr at 11/04/19 0650  . TPN ADULT (ION)       LOS: 23 days    Time spent: 45 minutes    Irine Seal, MD [facility]   To contact the attending provider between 7A-7P or the covering provider during after hours 7P-7A, please log into the web site [email or web address] and access using universal [place] password for that web site. If you do not have the password, please call the hospital operator.  11/04/2019, 3:15 PM

## 2019-11-05 LAB — CBC
HCT: 26.8 % — ABNORMAL LOW (ref 39.0–52.0)
Hemoglobin: 8.4 g/dL — ABNORMAL LOW (ref 13.0–17.0)
MCH: 26.8 pg (ref 26.0–34.0)
MCHC: 31.3 g/dL (ref 30.0–36.0)
MCV: 85.4 fL (ref 80.0–100.0)
Platelets: 366 10*3/uL (ref 150–400)
RBC: 3.14 MIL/uL — ABNORMAL LOW (ref 4.22–5.81)
RDW: 17.1 % — ABNORMAL HIGH (ref 11.5–15.5)
WBC: 9.7 10*3/uL (ref 4.0–10.5)
nRBC: 0 % (ref 0.0–0.2)

## 2019-11-05 LAB — GLUCOSE, CAPILLARY: Glucose-Capillary: 105 mg/dL — ABNORMAL HIGH (ref 70–99)

## 2019-11-05 LAB — MAGNESIUM: Magnesium: 2.1 mg/dL (ref 1.7–2.4)

## 2019-11-05 LAB — COMPREHENSIVE METABOLIC PANEL
ALT: 68 U/L — ABNORMAL HIGH (ref 0–44)
AST: 64 U/L — ABNORMAL HIGH (ref 15–41)
Albumin: 1.6 g/dL — ABNORMAL LOW (ref 3.5–5.0)
Alkaline Phosphatase: 72 U/L (ref 38–126)
Anion gap: 8 (ref 5–15)
BUN: 29 mg/dL — ABNORMAL HIGH (ref 8–23)
CO2: 24 mmol/L (ref 22–32)
Calcium: 7.9 mg/dL — ABNORMAL LOW (ref 8.9–10.3)
Chloride: 106 mmol/L (ref 98–111)
Creatinine, Ser: 0.69 mg/dL (ref 0.61–1.24)
GFR calc Af Amer: >60 mL/min (ref >60)
GFR calc non Af Amer: >60 mL/min (ref >60)
Glucose, Bld: 95 mg/dL (ref 70–99)
Potassium: 3.7 mmol/L (ref 3.5–5.1)
Sodium: 138 mmol/L (ref 135–145)
Total Bilirubin: 0.3 mg/dL (ref 0.3–1.2)
Total Protein: 6.4 g/dL — ABNORMAL LOW (ref 6.5–8.1)

## 2019-11-05 LAB — PHOSPHORUS: Phosphorus: 2.7 mg/dL (ref 2.5–4.6)

## 2019-11-05 LAB — C DIFFICILE (CDIFF) QUICK SCRN (NO PCR REFLEX)
C Diff antigen: NEGATIVE
C Diff interpretation: NEGATIVE
C Diff toxin: NEGATIVE

## 2019-11-05 LAB — HEPARIN LEVEL (UNFRACTIONATED)
Heparin Unfractionated: 0.71 IU/mL — ABNORMAL HIGH (ref 0.30–0.70)
Heparin Unfractionated: 0.3 IU/mL (ref 0.30–0.70)

## 2019-11-05 MED ORDER — LOPERAMIDE HCL 2 MG PO CAPS: 2.0000 mg | ORAL_CAPSULE | ORAL | Status: DC | PRN

## 2019-11-05 MED ORDER — HEPARIN (PORCINE) 25000 UT/250ML-% IV SOLN
1200.0000 [IU]/h | INTRAVENOUS | Status: DC
  Administered 2019-11-05: 1350 [IU]/h via INTRAVENOUS
  Administered 2019-11-07: 1200 [IU]/h via INTRAVENOUS
  Filled 2019-11-05 (×3): qty 250

## 2019-11-05 MED ORDER — POTASSIUM CHLORIDE 10 MEQ/100ML IV SOLN
10.0000 meq | INTRAVENOUS | Status: AC
  Administered 2019-11-05 (×4): 10 meq via INTRAVENOUS
  Filled 2019-11-05 (×2): qty 100

## 2019-11-05 MED ORDER — LOPERAMIDE HCL 2 MG PO CAPS
4.0000 mg | ORAL_CAPSULE | Freq: Once | ORAL | Status: DC
  Filled 2019-11-05: qty 2

## 2019-11-05 MED ORDER — LIDOCAINE 5 % EX PTCH
1.0000 | MEDICATED_PATCH | Freq: Once | CUTANEOUS | Status: AC
  Administered 2019-11-05: 1 via TRANSDERMAL
  Filled 2019-11-05: qty 1

## 2019-11-05 MED ORDER — FLUCONAZOLE IN SODIUM CHLORIDE 400-0.9 MG/200ML-% IV SOLN
400.0000 mg | INTRAVENOUS | Status: DC
  Administered 2019-11-06 – 2019-11-12 (×7): 400 mg via INTRAVENOUS
  Filled 2019-11-05 (×9): qty 200

## 2019-11-05 MED ORDER — TRAVASOL 10 % IV SOLN
INTRAVENOUS | Status: AC
  Filled 2019-11-05: qty 1208.4

## 2019-11-05 MED ORDER — LIDOCAINE 5 % EX PTCH
2.0000 | MEDICATED_PATCH | CUTANEOUS | Status: DC
  Administered 2019-11-06 – 2019-11-30 (×26): 2 via TRANSDERMAL
  Filled 2019-11-05 (×29): qty 2

## 2019-11-05 MED ORDER — ACETAMINOPHEN 160 MG/5ML PO SOLN
650.0000 mg | ORAL | Status: DC | PRN
  Administered 2019-11-07 – 2019-11-22 (×3): 650 mg
  Filled 2019-11-05 (×3): qty 20.3

## 2019-11-05 NOTE — Progress Notes (Signed)
Referring Physician(s): Dr. [name]  Supervising Physician: Jacqulynn Cadet  Patient Status:  [facility] - In-pt  Chief Complaint: Abdominal pain/fluid collection  Subjective: Patient awake, daughter at bedside. No complaints of pain related to drain.   Allergies: Losartan potassium  Medications: Prior to Admission medications   Medication Sig Start Date End Date Taking? Authorizing Provider  alfuzosin (UROXATRAL) 10 MG 24 hr tablet Take 10 mg by mouth daily. 09/07/19  Yes [provider]  amLODipine (NORVASC) 5 MG tablet Take 5 mg by mouth daily. 07/13/19  Yes [provider]  aspirin 81 MG tablet Take 81 mg by mouth every morning.    Yes [provider]  atorvastatin (LIPITOR) 40 MG tablet Take 40 mg by mouth at bedtime.    Yes [provider]  dicyclomine (BENTYL) 20 MG tablet Take 1 tablet (20 mg total) by mouth 2 (two) times daily. 10/11/19  Yes Nuala Alpha A, PA-C  famotidine (PEPCID) 20 MG tablet Take 20 mg by mouth 2 (two) times daily.   Yes [provider]  hydrocortisone valerate cream (WESTCORT) 0.2 % Apply 1 application topically 2 (two) times daily.  09/20/19  Yes [provider]  isosorbide mononitrate (IMDUR) 60 MG 24 hr tablet Take 60 mg by mouth every evening.   Yes [provider]  lactulose (CHRONULAC) 10 GM/15ML solution Take 10 g by mouth as needed for mild constipation.   Yes [provider]  metFORMIN (GLUCOPHAGE) 500 MG tablet Take 500 mg by mouth 2 (two) times daily. 09/20/19  Yes [provider]  metoprolol succinate (TOPROL-XL) 50 MG 24 hr tablet Take 50 mg by mouth 2 (two) times daily. 04/07/14  Yes [provider]  potassium chloride (KLOR-CON) 10 MEQ tablet Take 2 tablets (20 mEq total) by mouth daily for 3 days. 10/11/19 10/14/19 Yes Nuala Alpha A, PA-C  PROCTOSOL HC 2.5 % rectal cream Place 1 application rectally daily as needed for hemorrhoids or itching.  11/06/13   Yes [provider]  ramipril (ALTACE) 10 MG capsule Take 10 mg by mouth 2 (two) times daily.    Yes [provider]  RESTASIS 0.05 % ophthalmic emulsion Place 1 drop into both eyes 2 (two) times daily.  09/09/19  Yes [provider]  senna-docusate (SENOKOT-S) 8.6-50 MG tablet Take 1 tablet by mouth daily. 10/09/19  Yes Lucrezia Starch, MD  [facility] injection Inject as directed once. 04/10/17   [provider]     Vital Signs: BP (!) 126/53   Pulse 67   Temp 98.1 F (36.7 C) (Axillary)   Resp (!) 22   Ht 5\' 11"  (1.803 m)   Wt 204 lb 12.9 oz (92.9 kg)   SpO2 97%   BMI 28.56 kg/m   Physical Exam Constitutional:      Appearance: He is ill-appearing.  Cardiovascular:     Rate and Rhythm: Normal rate and regular rhythm.  Pulmonary:     Effort: Pulmonary effort is normal.  Abdominal:     Tenderness: There is generalized abdominal tenderness.     Comments: Mid-abdominal surgical incision. Dressing in place. RUQ drain to gravity bag. Dressing clean and dry. Skin insertion site with mild erythema, no drainage observed. Approximately 10 cc purulent fluid in bag.   Musculoskeletal:     Comments: Right arm/hand swelling.   Skin:    General: Skin is warm and dry.  Neurological:     Mental Status: He is alert.     Imaging:  DG CHEST PORT 1 VIEW  Result Date: 11/03/2019 CLINICAL DATA:  Aspiration. EXAM: PORTABLE CHEST 1 VIEW COMPARISON:  October 31, 2019. FINDINGS: The heart size and mediastinal contours are within normal limits. Both lungs are clear. Nasogastric tube is seen entering stomach. The visualized skeletal structures are unremarkable. IMPRESSION: No active disease. Electronically Signed   By: Marijo Conception M.D.   On: 11/03/2019 15:38   DG UGI W SINGLE CM (SOL OR THIN BA)  Result Date: 11/03/2019 CLINICAL DATA:  86 year old male with history of perforated duodenal ulcer. EXAM: WATER SOLUBLE UPPER GI SERIES TECHNIQUE: Single-column upper GI  series was performed using water soluble contrast. CONTRAST:  140mL OMNIPAQUE IOHEXOL 300 MG/ML  SOLN COMPARISON:  Upper GI 10/28/2019. FLUOROSCOPY TIME:  Fluoroscopy Time:  2 minutes and 36 seconds Radiation Exposure Index (if provided by the fluoroscopic device): 33.3 mGy FINDINGS: 125 mL of Omnipaque 300 was injected via the indwelling nasogastric tube. Upon adequate filling of the stomach, the patient was position in a RPO position allowing drainage through the pylorus into the duodenum. Upon repeated pyloric emptying, a small pool of contrast material was noted to be persistent in the region of the duodenum, which may represent some irregularity at the site of duodenal repair. However, at no time during the examination was there is extravasation of contrast material. Specifically, no definite residual fistulous tract was identified between the duodenal bulb and the adjacent pigtail drainage catheter. IMPRESSION: 1. Mild irregularity of the duodenal bulb likely related to prior surgical repair. No definite persistent leak identified on today's examination. Electronically Signed   By: Vinnie Langton M.D.   On: 11/03/2019 10:33   VAS [place] UPPER EXTREMITY VENOUS DUPLEX  Result Date: 11/02/2019 UPPER VENOUS STUDY  Indications: Pain, and Swelling Anticoagulation: Heparin. Limitations: Line and patient pain tolerance, patient positioning. Comparison Study: 10/17/2019 - Left Axillary DVT Performing Technologist: Oliver Hum RVT  Examination Guidelines: A complete evaluation includes B-mode imaging, spectral Doppler, color Doppler, and power Doppler as needed of all accessible portions of each vessel. Bilateral testing is considered an integral part of a complete examination. Limited examinations for reoccurring indications may be performed as noted.  Right Findings: +----------+------------+---------+-----------+----------+-------+ RIGHT     CompressiblePhasicitySpontaneousPropertiesSummary  +----------+------------+---------+-----------+----------+-------+ IJV           Full       Yes       Yes                      +----------+------------+---------+-----------+----------+-------+ Subclavian    Full       Yes       Yes                      +----------+------------+---------+-----------+----------+-------+ Axillary      Full       Yes       Yes                      +----------+------------+---------+-----------+----------+-------+ Brachial      Full       Yes       Yes                      +----------+------------+---------+-----------+----------+-------+ Radial        Full                                          +----------+------------+---------+-----------+----------+-------+  Ulnar         Full                                          +----------+------------+---------+-----------+----------+-------+ Cephalic      Full                                          +----------+------------+---------+-----------+----------+-------+ Basilic       Full                                          +----------+------------+---------+-----------+----------+-------+  Left Findings: +----------+------------+---------+-----------+----------+-------+ LEFT      CompressiblePhasicitySpontaneousPropertiesSummary +----------+------------+---------+-----------+----------+-------+ Subclavian    Full       Yes       Yes                      +----------+------------+---------+-----------+----------+-------+  Summary:  Right: No evidence of deep vein thrombosis in the upper extremity. No evidence of superficial vein thrombosis in the upper extremity.  Left: No evidence of thrombosis in the subclavian.  *See table(s) above for measurements and observations.  Diagnosing physician: Monica Martinez MD Electronically signed by Monica Martinez MD on 11/02/2019 at 4:49:26 PM.    Final     Labs:  CBC: Recent Labs    11/02/19 0540 11/03/19 0430  11/04/19 0609 11/05/19 0618  WBC 13.6* 9.9 9.9 9.7  HGB 9.1* 8.0* 8.5* 8.4*  HCT 28.7* 25.8* 27.4* 26.8*  PLT 367 284 373 366    COAGS: No results for input(s): INR, APTT in the last 8760 hours.  BMP: Recent Labs    11/02/19 0540 11/03/19 0521 11/04/19 0609 11/05/19 0618  NA 137 136 137 138  K 4.1 4.1 4.4 3.7  CL 107 105 106 106  CO2 22 23 24 24   GLUCOSE 104* 135* 181* 95  BUN 27* 29* 31* 29*  CALCIUM 8.0* 7.6* 7.6* 7.9*  CREATININE 0.69 0.68 0.80 0.69  GFRNONAA >60 >60 >60 >60  GFRAA >60 >60 >60 >60    LIVER FUNCTION TESTS: Recent Labs    11/01/19 0645 11/02/19 0540 11/03/19 0521 11/05/19 0618  BILITOT 0.7 0.6 0.7 0.3  AST 27 49* 48* 64*  ALT 26 44 48* 68*  ALKPHOS 64 86 82 72  PROT 6.9 7.1 6.2* 6.4*  ALBUMIN 1.6* 1.5* 1.4* 1.6*    Assessment and Plan:  Perforated duodenal ulcer; right upper quadrant fluid collection/abscess: S/P drain placement by Dr. Earleen Newport 10/22/19. 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 Upper GI series done on 11/03/19 showed: Mild irregularity of the duodenal bulb likely related to prior surgical repair. No definite persistent leak identified on today's examination. Patient now scheduled for follow-up drain injection 11/06/19.   Patient is afebrile. WBC 9.7. Drain with 20 cc output documented in Epic. Continue to monitor drain output, change dressing as needed.    Electronically Signed: Soyla Dryer, AGACNP-BC [phone] 11/05/2019, 3:38 PM   I spent a total of 15 Minutes at the the patient's bedside AND on the patient's hospital floor or unit, greater than 50% of which was counseling/coordinating care for right abdominal fluid collection drain.

## 2019-11-05 NOTE — Progress Notes (Signed)
Called [name] (daughter) to provide update. She had phoned earlier while I was unavailable.

## 2019-11-05 NOTE — Progress Notes (Signed)
[place] for Heparin Indication: chest pain/ACS, left upper extremity DVT  Allergies  Allergen Reactions   Losartan Potassium Other (See Comments)    Unknown reaction that happened 15 years ago   Patient Measurements: Height: 5\' 11"  (180.3 cm) Weight: 92.9 kg (204 lb 12.9 oz) IBW/kg (Calculated) : 75.3 Heparin Dosing Weight: 90 kg  Vital Signs: Temp: 97.3 F (36.3 C) (07/08 0800) Temp Source: Oral (07/08 0800) BP: 121/58 (07/08 0800) Pulse Rate: 73 (07/08 0800)  Labs: Recent Labs    11/03/19 0430 11/03/19 0430 11/03/19 0521 11/03/19 1212 11/04/19 0609 11/05/19 0618  HGB 8.0*   < >  --   --  8.5* 8.4*  HCT 25.8*  --   --   --  27.4* 26.8*  PLT 284  --   --   --  373 366  HEPARINUNFRC 0.31   < >  --  0.41 0.43 0.71*  CREATININE  --   --  0.68  --  0.80 0.69   < > = values in this interval not displayed.   Estimated Creatinine Clearance: 80 mL/min (by C-G formula based on SCr of 0.69 mg/dL).  Assessment: 86 yo male with perforated duodenal ulcer s/p omental patch repair.  Pharmacy consulted to dose IV heparin for NSTEMI as well as for acute deep vein thrombosis involving the left axillary vein, surrounding the PICC line. Findings consistent with acute superficial vein thrombosis involving the left cephalic vein. Patient with anemia of critical illness/post-op s/p transfusion 6/27.   Significant events: 6/27: hold for Hgb 6.2 6/28: bloody stools, small amount of bleeding from surgical incision and JP drain  6/29: resume heparin 7/1: d/c heparin 2/2 hematoma at surgical site - of no consequence per surgery 7/2: d/w MD - continue to hold, reconsider resuming in next several days 7/3: resume heparin  Today, 11/05/2019  HL = 0.71 slightly supratherapeutic on heparin infusion of 1400 units/hr  CBC: Hgb (8.4) low and relatively stable; Plt WNL  No issue with IV infusion reported. Pt does have hematoma at surgical site - surgery  aware, no worsening. No signs of overt bleeding.  Goal of Therapy:  Heparin level 0.3-0.7 units/ml Monitor platelets by anticoagulation protocol: Yes   Plan:   Decrease heparin infusion to rate of 1350 units/hr  Obtain HL 8 hours after rate change   Check HL, CBC with AM labs tomorrow and daily while on heparin infusion  Monitor for signs/symptoms of bleeding  Royetta Asal, PharmD, BCPS 11/05/2019 9:17 AM

## 2019-11-05 NOTE — Progress Notes (Signed)
Inpatient Rehabilitation-Admissions Coordinator   CIR consult received. Per chart review, pt's progress has slowed and he requires encouragement to participate. Based on notes, feel pt is not able to tolerate an intensive rehab program at this time. Note PT and OT recommend SNF vs LTAC. With these recommendations and current level of function, it is unlikely pt would be approved by his insurance for an IP Rehab stay. Agree with therapy recommendations for a slower paced rehab program. [facility] will sign off.   Raechel Ache, OTR/L  Rehab Admissions Coordinator  [phone] 11/05/2019 12:08 PM

## 2019-11-05 NOTE — Progress Notes (Signed)
PROGRESS NOTE    Matthew Schwartz  JKK:[id number] DOB: 1933/04/08 DOA: 10/12/2019 PCP: [name], MD   Chief Complaint  Patient presents with  . Abdominal Pain  . Vomiting    Brief Narrative:  [name] NIDDM, HLD, GERD, irritable bowel syndrome (constipation) status post left knee replacement 2009, PTCA 2000-->RCA well as prior prostate CA, former smoker Admit 10/12/2019 severe abdominal pain-ex lap 10/12/2019-perforated duodenal ulcer and remained on vent NSVT postop-echo showed Systolic HF--was on pressors and cardiology consulted-eventually extubated 6/17 + fevers 6/23= postoperative abscess-IR  Placed abd perc drain 6/24--growing Candida /on diflucan On 6/30 upper GI series were performed-and repeated Unfortunately spiked a fever without cause-found to have on CT 7/1 enlarging hematoma General surgery felt this was minimal and not the cause for his fever He has not had high-grade fever since his Antibiotics were adjusted 7/1 vancomycin and Primaxin but continues to have low-grade Heparin was held temporarily until 7/3 and restarted cautiously 7/3 after risk-benefit discussion with family  He has stabilized to some degree on stepdown unit he has had some pain and swelling in his upper extremities likely from the prior DVT in his left upper extremity-his right arm was imaged for DVT and found to be negative He has a long road ahead and TOC is working with patient and family to figure out next steps in terms of placement he may be able to eventually go to an [place]:   Principal Problem:   Severe sepsis with septic shock ([place]) Active Problems:   Perforated duodenal ulcer ([place])   Peritonitis ([place])   HTN (hypertension)   Malignant neoplasm of prostate ([place])   Hyperlipidemia   Obstipation   HLD (hyperlipidemia)   Diabetes mellitus type 2, uncontrolled, with complications ([place])   Perforated bowel ([place])   Acute respiratory insufficiency    Elevated troponin level   Acute respiratory failure with hypoxemia (H[facility])   PVCs (premature ventricular contractions)   Bilateral atelectasis   Acute abdominal pain   Palliative care by specialist   Goals of care, counseling/discussion   General weakness   Epigastric pain   Gastric ulcer   Hypokalemia   NSVT (nonsustained ventricular tachycardia) ([place])   Diarrhea   NSTEMI (non-ST elevated myocardial infarction) ([place])   Acute deep vein thrombosis (DVT) of left upper extremity ([place])   Acute systolic CHF (congestive heart failure) ([place])  1 septic shock secondary to peritonitis secondary to perforated duodenal ulcer Status post expiratory laparotomy, omental patch repair of duodenal ulcer per Dr. 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  Currently afebrile.  Normal white count.  Patient noted to have upper GI series 10/28/2019 showing persistent small leak with repeat upper GI series done on 11/03/2018 one of the duodenal bulb likely related to prior surgical repair.  No definite persistent leak identified on today's examination.  Patient noted to have fluid collections 10/21/2019 with drain placement with cultures positive for Candida.  Status post drain placement.  On IV fluconazole.  Patient noted to have a T-max of 102.1 on 7/1/ 2021 with a change in antibiotics from IV vancomycin(s/p 5 days) to IV Merrem.  Blood cultures obtained with no growth to date.  Currently afebrile with a normal white count.  Dr. [name] discussed with general surgery, Dr. [name] on 10/30/2019 and was felt that enlarging hematoma was not source of infection as it would drain into the midline wound.  Continue IV PPI, bowel rest, NG  tube, TPN.  Patient now on day 8 of IV Merrem.  Patient with loose stools.  Currently afebrile.  Discontinue IV Merrem after today's doses.  Continue IV fluconazole for another 2 days.  Mobilize.  Per general surgery.  2.  Acute hypoxic respiratory  failure Status post extubation 10/15/2019.  NG tube has been discontinued this morning.  Patient currently euvolemic.  98% on room air.   3.  Acute systolic heart failure/non-STEMI/coronary artery disease 2000/NSVT Currently stable.  Patient on clonidine patch and IV beta-blocker.  Patient noted to have a 5-6 beat run of nonsustained V. tach however asymptomatic on 11/04/2019.  Potassium at 3.7.  Magnesium at 2.1.  Phosphorus at 2.7.  IV beta-blocker dose has been increased to 10 mg every 4 hours per cardiology.  Cardiology following peripherally.  Keep potassium greater than 4.  4.  Left upper extremity DVT cephalic/axillary vein Likely secondary to PICC line.  On IV heparin.  Follow.  5.  Right upper extremity swelling and pain and discomfort Heparin initially discontinued on 10/29/2019 after enlarging hematoma.  Heparin was resumed without a bolus on 10/31/2019 after Dr. [name] discussed with a specialist.  Right upper extremity discomfort initially felt to be secondary to a DVT however Dopplers negative.  Hemoglobin currently stable at 8.4.  Follow H&H.  6.  Well-controlled diabetes mellitus type 2 Hemoglobin A1c 6.6.  Blood glucose level of 95 this morning on basic metabolic profile.  Patient noted not to be a diabetic prior to admission and elevated CBGs likely secondary to TPN.  Sliding scale insulin.  7.  Anemia of critical illness Status post transfusion of 2 units packed red blood cells.  Hemoglobin currently stable at 8.4.  Follow H&H.  8.  Malignancy of the prostate Outpatient follow-up.  9.  Tobacco abuse Tobacco cessation.  10.  Diarrhea Per RN patient with voluminous loose stools noted in rectal pouch.  Rectal pouch this morning with out about 700 cc of liquid stool.  Patient with some abdominal distention.  Patient afebrile.  Normal white count.  Patient presented with a duodenal ulcer status post perforation with peritonitis and septic shock and on IV antibiotics and IV  fluconazole.  We will check a C. difficile PCR.  If C. difficile PCR is negative will place on Imodium as needed.  Will discontinue IV antibiotics after today's dose.     DVT prophylaxis: Heparin Code Status: Full Family Communication: Updated patient and daughter at bedside. Disposition:   Status is: Inpatient    Dispo:  Patient From: [facility]  Planned Disposition: [facility]  Expected discharge date: 11/27/19  Medically stable for discharge: No patient currently n.p.o., on TPN, on IV antibiotics, being followed by general surgery, significantly weak.  Will likely require SNF.        Consultants:   Palliative care: Dr. Rowe Pavy 10/24/2019  Cardiology: Dr. Margaretann Loveless 10/13/2019  General surgery: Dr. [name] 10/12/2019  Gastroenterology: Dr. Therisa Doyne 10/12/2019  PCCM: Dr. Halford Chessman 10/12/2019  Procedures:  CT abdomen and pelvis 10/21/2019, 10/29/2019  CT angiogram abdomen and pelvis 10/11/2019  Upper GI series 10/19/2019, 10/28/2019, 11/03/2019  2D echo 10/13/2019, 10/16/2019  Right upper extremity Doppler 11/02/2019  Left upper extremity Dopplers 10/17/2019  CT-guided drain placement right upper quadrant fluid collection per interventional radiology, Dr. Earleen Newport 10/22/2019  Exploratory laparotomy, omental patch repair of duodenal ulcer per Dr. [name] 10/12/2019  Right IJ CVL 10/12/2019>>>> 10/13/2019  Right radial A-line 10/12/2019>>>> 10/15/2019  ETT 10/12/2019>>>> 10/15/2019  Left upper extremity PICC  10/14/2019  Significant Hospital Events   6/14 Admit, exploratory laparotomy, omental patch repair of duodenal ulcer 6/16 Off vasopressors, less drainage from abd drain / clearing  6/17 extubated, weak cough, added high flow oxygen 6/18 start heparin gtt 6/20 d/c amiodarone 6/23 new fluid collection on CT abd > Drained by IR 6/24 To [facility] 6/25 PCCM sign off 7/01 back to ICU with fever, tachypnea >> PCCM asked to reassess 7/2comfortable this  morning     Significant Diagnostic Tests:   CT angio abd/pelvis 6/13 >> mild atherosclerosis, no significant arterial stenosis of mesenteric vasculature, no findings of bowel ischemia, colonic diverticulosis w/o diverticulitis, b/l renal cysts, 12 mm lesion upper pole Rt kidney  ECHO 6/15 >> poor image quality, appears to be regional wall motion variability but confident wall motion analysis is impossible even after contrast administration, LVEF ~ 85-88%, Grade I diastolic dysfunction, RV systolic function is moderately reduced, RV is mildly enlarged  6/19 venous doppler LUE>>acute deep vein thrombosis involving the left axillary vein, surrounding the PICC line. Findings consistent with acute superficial vein thrombosis involving the left cephalic vein.  UGI 6/21 >> small leak from duodenum  CT ABD/Pelvis 6/23 >>interval midline abdominal incision with incomplete skin surgical closure, probable small hematomas within the anterior abdominal wall and the omentum. Nonspecific 4.3 cm air-fluid collection in the RUQ of the abdomen between the duodenal bulb and gallbladder, not containing enteric contrast. No residual leak from the duodenal bulb identified. New small bilateral pleural effusions with dependent L>R lower lobe opacities  Upper GI series 6/30 >>Small puckered stump of a leak at the site of the prior elongatedeak. Findings suggest resolving but persistent extraluminalcollection.  CT abd/pelvis 7/01 >>Anterior midline surgical incision is again noted. Large irregular high density is noted in this area most consistent with enlarging subcutaneous or intramuscular hematoma. Interval placement of pigtail drainage catheter in the area of fluid collection noted on prior exam in the right upper quadrant. Fluid collection is significantly decompressed compared to prior exam. Micro Data:  SARS CoV2 6/13 >> negative RUQ fluid collection 6/24 >> moderate Candida albicans Blood 6/25  >>negative Blood culture 10/29/2019>>> negative     Antimicrobials:   IV Merrem 10/29/2019>>>>>> 11/05/2019  IV Zosyn 10/12/2019>>>> 10/29/2019  IV vancomycin 10/29/2019>>>> 11/03/2019  IV fluconazole 10/25/2019>>>>   Subjective: Patient laying on his side rectal tube being changed out.  Rectal tube with about 700 cc of liquid stool.  Patient denies any chest pain or shortness of breath.  States feels less gaseous today.  NG tube has been removed per RN this morning.  Asking how long he will be in the hospital for.  Objective: Vitals:   11/05/19 0400 11/05/19 0500 11/05/19 0605 11/05/19 0800  BP: (!) 156/62  (!) 141/64 (!) 121/58  Pulse: 78  85 73  Resp: (!) 26  (!) 26 (!) 22  Temp: (!) 97 F (36.1 C)   (!) 97.3 F (36.3 C)  TempSrc: Axillary   Oral  SpO2: 98%  98% 95%  Weight:  92.9 kg    Height:        Intake/Output Summary (Last 24 hours) at 11/05/2019 0913 Last data filed at 11/05/2019 0800 Gross per 24 hour  Intake 3309.71 ml  Output 1850 ml  Net 1459.71 ml   Filed Weights   11/03/19 0500 11/04/19 0500 11/05/19 0500  Weight: 90.3 kg 91 kg 92.9 kg    Examination:  General exam: NAD  Respiratory system: Lungs clear to auscultation bilaterally.  No  wheezes, no crackles, no rhonchi.  Normal respiratory effort.   Cardiovascular system: Regular rate and rhythm no murmurs rubs or gallops.  No JVD.  No lower extremity edema.  Gastrointestinal system: Abdomen is soft, distended, decreased bowel sounds.  No rebound.  No guarding.   Central nervous system: Alert and oriented. No focal neurological deficits. Extremities: Symmetric 5 x 5 power. Skin: No rashes, lesions or ulcers Psychiatry: Judgement and insight appear normal. Mood & affect appropriate.     Data Reviewed: I have personally reviewed following labs and imaging studies  CBC: Recent Labs  Lab 10/29/19 2100 10/30/19 0441 11/01/19 0500 11/02/19 0540 11/03/19 0430 11/04/19 0609 11/05/19 0618  WBC 10.0   < >  10.3 13.6* 9.9 9.9 9.7  NEUTROABS 7.4  --  7.6 10.3*  --   --   --   HGB 8.9*   < > 8.7* 9.1* 8.0* 8.5* 8.4*  HCT 28.2*   < > 27.4* 28.7* 25.8* 27.4* 26.8*  MCV 85.5   < > 91.3 84.7 96.3 85.1 85.4  PLT 396   < > 351 367 284 373 366   < > = values in this interval not displayed.    Basic Metabolic Panel: Recent Labs  Lab 11/01/19 0645 11/01/19 0645 11/02/19 0540 11/03/19 0430 11/03/19 0521 11/04/19 0609 11/05/19 0618  NA 136  --  137  --  136 137 138  K 3.7  --  4.1  --  4.1 4.4 3.7  CL 105  --  107  --  105 106 106  CO2 24  --  22  --  23 24 24   GLUCOSE 123*  --  104*  --  135* 181* 95  BUN 24*  --  27*  --  29* 31* 29*  CREATININE 0.74  --  0.69  --  0.68 0.80 0.69  CALCIUM 7.8*  --  8.0*  --  7.6* 7.6* 7.9*  MG 2.2   < > 2.2 4.6* 2.3 2.5* 2.1  PHOS 2.5   < > 2.7 8.3* 2.7 3.2 2.7   < > = values in this interval not displayed.    GFR: Estimated Creatinine Clearance: 80 mL/min (by C-G formula based on SCr of 0.69 mg/dL).  Liver Function Tests: Recent Labs  Lab 10/31/19 0245 11/01/19 0645 11/02/19 0540 11/03/19 0521 11/05/19 0618  AST 16 27 49* 48* 64*  ALT 20 26 44 48* 68*  ALKPHOS 56 64 86 82 72  BILITOT 0.8 0.7 0.6 0.7 0.3  PROT 7.0 6.9 7.1 6.2* 6.4*  ALBUMIN 1.6* 1.6* 1.5* 1.4* 1.6*    CBG: Recent Labs  Lab 11/03/19 1819 11/03/19 2330 11/04/19 0546 11/04/19 1122 11/04/19 1835  GLUCAP 107* 129* 115* 122* 118*     Recent Results (from the past 240 hour(s))  Culture, blood (single)     Status: None   Collection Time: 10/29/19  7:06 PM   Specimen: BLOOD  Result Value Ref Range Status   Specimen Description   Final    BLOOD RIGHT ANTECUBITAL Performed at [facility], [place] [address], [place], [place] [address]    Special Requests   Final    BOTTLES DRAWN AEROBIC ONLY Blood Culture results may not be optimal due to an inadequate volume of blood received in culture bottles Performed at [facility]  [address], [place], [place] [address]    Culture   Final    NO GROWTH 5 DAYS Performed at [facility]  [facility], [place] [address], [place], [place] [address]    Report Status 11/03/2019 FINAL  Final         Radiology Studies: DG CHEST PORT 1 VIEW  Result Date: 11/03/2019 CLINICAL DATA:  Aspiration. EXAM: PORTABLE CHEST 1 VIEW COMPARISON:  October 31, 2019. FINDINGS: The heart size and mediastinal contours are within normal limits. Both lungs are clear. Nasogastric tube is seen entering stomach. The visualized skeletal structures are unremarkable. IMPRESSION: No active disease. Electronically Signed   By: Marijo Conception M.D.   On: 11/03/2019 15:38   DG UGI W SINGLE CM (SOL OR THIN BA)  Result Date: 11/03/2019 CLINICAL DATA:  86 year old male with history of perforated duodenal ulcer. EXAM: WATER SOLUBLE UPPER GI SERIES TECHNIQUE: Single-column upper GI series was performed using water soluble contrast. CONTRAST:  176mL OMNIPAQUE IOHEXOL 300 MG/ML  SOLN COMPARISON:  Upper GI 10/28/2019. FLUOROSCOPY TIME:  Fluoroscopy Time:  2 minutes and 36 seconds Radiation Exposure Index (if provided by the fluoroscopic device): 33.3 mGy FINDINGS: 125 mL of Omnipaque 300 was injected via the indwelling nasogastric tube. Upon adequate filling of the stomach, the patient was position in a RPO position allowing drainage through the pylorus into the duodenum. Upon repeated pyloric emptying, a small pool of contrast material was noted to be persistent in the region of the duodenum, which may represent some irregularity at the site of duodenal repair. However, at no time during the examination was there is extravasation of contrast material. Specifically, no definite residual fistulous tract was identified between the duodenal bulb and the adjacent pigtail drainage catheter. IMPRESSION: 1. Mild irregularity of the duodenal bulb likely related to prior surgical repair. No definite persistent leak identified on today's examination.  Electronically Signed   By: Vinnie Langton M.D.   On: 11/03/2019 10:33        Scheduled Meds: . chlorhexidine  15 mL Mouth Rinse BID  . Chlorhexidine Gluconate Cloth  6 each Topical Daily  . cloNIDine  0.2 mg Transdermal Weekly  . insulin aspart  0-20 Units Subcutaneous Q6H  . insulin detemir  10 Units Subcutaneous BID  . lidocaine  1 patch Transdermal Q24H  . mouth rinse  15 mL Mouth Rinse q12n4p  . metoprolol tartrate  10 mg Intravenous Q4H  . nitroGLYCERIN  1 inch Topical Q6H  . pantoprazole (PROTONIX) IV  40 mg Intravenous Q12H  . simethicone  160 mg Oral QID  . sodium chloride flush  10-40 mL Intracatheter Q12H  . sodium chloride flush  5 mL Intracatheter Q8H  . sucralfate  1 g Oral BID   Continuous Infusions: . sodium chloride 10 mL/hr at 11/05/19 0800  . sodium chloride Stopped (10/31/19 2220)  . fluconazole (DIFLUCAN) IV Stopped (11/04/19 2015)  . heparin 1,400 Units/hr (11/05/19 0800)  . meropenem (MERREM) IV Stopped (11/05/19 7096)  . TPN ADULT (ION) 95 mL/hr at 11/05/19 0800     LOS: 24 days    Time spent: 40 minutes    Irine Seal, MD [facility]   To contact the attending provider between 7A-7P or the covering provider during after hours 7P-7A, please log into the web site [email or web address] and access using universal [place] password for that web site. If you do not have the password, please call the hospital operator.  11/05/2019, 9:13 AM

## 2019-11-05 NOTE — Progress Notes (Signed)
NGT removed on dayshift today. Imodium PO ordered for 1900 by attending however pt NPO except ice. Gave patient a very small amount of water via spoon and patient coughed. PO imodium not given. Will communicate with oncoming RN for Attending to assess for speech eval. Pt with minimum stool on my shift thus far and has fecal management pouch in place to prevent further moisture associated skin damage.

## 2019-11-05 NOTE — Progress Notes (Signed)
Pharmacy Brief Note - Anticoagulation Follow Up:  Pt on heparin drip for VTE treatment.   Assessment:  HL = 0.30 is therapeutic on heparin infusion of 1350 units/hr  Confirmed with RN that heparin infusing at correct rate with no interruptions. No signs of bleeding. Hematoma stable.  Goal: HL 0.3-0.7  Plan:  Continue heparin at current rate of 1350 units/hr  Check confirmatory HL in 6 hours  CBC, HL daily   Lenis Noon, PharmD 11/05/19 7:07 PM

## 2019-11-05 NOTE — Progress Notes (Signed)
Daily Progress Note   Patient Name: Matthew Schwartz       Date: 11/05/2019 DOB: 07-06-1932  Age: 86 y.o. MRN#: [id number] Attending Physician: Eugenie Filler, MD Primary Care Physician: [name], MD Admit Date: 10/12/2019  Reason for Consultation/Follow-up: Establishing goals of care  Subjective: I saw and examined Matthew Schwartz today and spoke with his daughter at the bedside.     We discussed about pain management and disposition options along with broad goals of care.  We discussed about his current mode of care. Patient awakens easily and responds appropriately. Appears chronically ill. 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 He is now retired, he was very active and independent prior to this hospitalization, he has 2 daughters.   Length of Stay: 24  Current Medications: Scheduled Meds:   chlorhexidine  15 mL Mouth Rinse BID   Chlorhexidine Gluconate Cloth  6 each Topical Daily   cloNIDine  0.2 mg Transdermal Weekly   insulin aspart  0-20 Units Subcutaneous Q6H   insulin detemir  10 Units Subcutaneous BID   lidocaine  1 patch Transdermal Q24H   mouth rinse  15 mL Mouth Rinse q12n4p   metoprolol tartrate  10 mg Intravenous Q4H   nitroGLYCERIN  1 inch Topical Q6H   pantoprazole (PROTONIX) IV  40 mg Intravenous Q12H   simethicone  160 mg Oral QID   sodium chloride flush  10-40 mL Intracatheter Q12H   sodium chloride flush  5 mL Intracatheter Q8H   sucralfate  1 g Oral BID    Continuous Infusions:  sodium chloride Stopped (11/05/19 1047)   sodium chloride Stopped (10/31/19 2220)   fluconazole (DIFLUCAN) IV Stopped (11/04/19 2015)   heparin 1,350 Units/hr (11/05/19 1000)   meropenem (MERREM) IV 1 g (11/05/19  1355)   potassium chloride Stopped (11/05/19 1259)   TPN ADULT (ION) 95 mL/hr at 11/05/19 1100   TPN ADULT (ION)      PRN Meds: sodium chloride, acetaminophen, acetaminophen, bisacodyl, fentaNYL (SUBLIMAZE) injection, hydrALAZINE, ipratropium-albuterol, lip balm, LORazepam, melatonin, ondansetron (ZOFRAN) IV, silver nitrate applicators, sodium chloride flush  Physical Exam         Elderly male resting in bed Has NG tube with suction Has some dependent edema.  Hands swollen. Regular work of breathing S1-S2  Abdomen not distended  Vital Signs: BP (!) 126/53    Pulse 67    Temp 98.1 F (36.7 C) (Axillary)    Resp (!) 22    Ht 5\' 11"  (1.803 m)    Wt 92.9 kg    SpO2 97%    BMI 28.56 kg/m  SpO2: SpO2: 97 % O2 Device: O2 Device: Room Air O2 Flow Rate: O2 Flow Rate (L/min): 2 L/min  Intake/output summary:   Intake/Output Summary (Last 24 hours) at 11/05/2019 1425 Last data filed at 11/05/2019 1200 Gross per 24 hour  Intake 3685.12 ml  Output 2270 ml  Net 1415.12 ml   LBM: Last BM Date: 11/05/19 Baseline Weight: Weight: 96.4 kg Most recent weight: Weight: 92.9 kg       Palliative Assessment/Data:    Flowsheet Rows     Most Recent Value  Intake Tab  Referral Department Hospitalist  Unit at Time of Referral ICU  Date Notified 10/23/19  Palliative Care Type New Palliative care  Reason for referral Clarify Goals of Care  Date of Admission 10/12/19  Date first seen by Palliative Care 10/24/19  # of days Palliative referral response time 1 Day(s)  # of days IP prior to Palliative referral 11  Clinical Assessment  Psychosocial & Spiritual Assessment  Palliative Care Outcomes     Palliative performance scale 30% Patient Active Problem List   Diagnosis Date Noted   Gastric ulcer    Hypokalemia    NSVT (nonsustained ventricular tachycardia) (HCC)    Diarrhea    Severe sepsis with septic shock (HCC)    NSTEMI (non-ST elevated myocardial infarction) (HCC)    Acute  deep vein thrombosis (DVT) of left upper extremity (HCC)    Acute systolic CHF (congestive heart failure) (HCC)    Peritonitis (HCC)    Epigastric pain    Palliative care by specialist    Goals of care, counseling/discussion    General weakness    Acute abdominal pain    Bilateral atelectasis 10/17/2019   Acute respiratory insufficiency    Elevated troponin level    Acute respiratory failure with hypoxemia (HCC)    PVCs (premature ventricular contractions)    Obstipation 10/12/2019   HLD (hyperlipidemia) 10/12/2019   Diabetes mellitus type 2, uncontrolled, with complications ([place]) 16/01/9603   Perforated bowel ([place]) 10/12/2019   Perforated duodenal ulcer ([place]) 10/12/2019   Anemia 09/07/2016   Coronary artery disease due to lipid rich plaque 02/16/2015   Hyperlipidemia 02/16/2015   Malignant neoplasm of prostate ([place]) 02/18/2014   HTN (hypertension) 06/14/2013   Dyslipidemia 06/14/2013   GERD (gastroesophageal reflux disease) 06/14/2013    Palliative Care Assessment & Plan   Patient Profile:    Assessment: 86 year old gentleman with coronary artery disease status post percutaneous intervention in the year 2000, hypertension, dyslipidemia, diabetes who was admitted with perforated duodenal ulcer and peritonitis. Cardiology has been following for NSVT, intermittent left bundle branch block and anterolateral T wave inversions as well as elevated troponin, at some point is supposed to undergo cardiac catheterization after acute issues resolved. General surgery also following.  Has NG tube.  To continue TPN.  Plan for another upper GI this week.  Recommendations/Plan:  Full code/Full scope care  Family is appreciative of TOC's correspondence in search for appropriate d/c options. His daughter reports that they have a lot of family support at home.  Pain: continue with IV tylenol and fentanyl to see if shorter acting medication controls pain but allows him  to be  more awake.    PMT will follow periodically, throughout the course of this hospitalization.    Goals of Care and Additional Recommendations:  Limitations on Scope of Treatment: Full Scope Treatment  Code Status:    Code Status Orders  (From admission, onward)         Start     Ordered   10/12/19 1208  Full code  Continuous        10/12/19 1212        Code Status History    Date Active Date Inactive Code Status Order ID Comments User Context   02/12/2014 1127 02/13/2014 0336 Full Code [id number]  Arne Cleveland, MD [place] Planning Activity    Advance Directive Documentation     Most Recent Value  Type of Advance Directive Healthcare Power of Attorney, Living will  Pre-existing out of facility DNR order (yellow form or pink MOST form) --  "MOST" Form in Place? --       Prognosis:   Unable to determine Guarded, continue to monitor hospital course and overall disease trajectory.  Discharge Planning:  To Be Determined Recommend: Inpatient rehab, LTACH, or SNF rehab with palliative  Care plan was discussed with daughter at bedside and also with [facility] colleague Suanne Marker.   Thank you for allowing the Palliative Medicine Team to assist in the care of this patient.   Total Time 25 Prolonged Time Billed  no    Greater than 50%  of this time was spent counseling and coordinating care related to the above assessment and plan.  Loistine Chance, MD  Please contact Palliative Medicine Team phone at [phone] for questions and concerns.

## 2019-11-05 NOTE — Progress Notes (Signed)
PHARMACY - TOTAL PARENTERAL NUTRITION CONSULT NOTE   Indication: Prolonged ileus  Patient Measurements: Height: _0  (180.3 cm) Weight: 92.9 kg (204 lb 12.9 oz) IBW/kg (Calculated) : 75.3 TPN AdjBW (KG): 93.6 Body mass index is 28.56 kg/m. Usual Weight: 90.7 kg   Assessment:  Pharmacy is consulted to start TPN in 86 yo male with prolonged ileus. Pt underwent exploratory laparotomy on 6/14 which revealed perforated duodenal ulcer.   Glucose / Insulin: hx of DM - on metformin PTA.  - CBGs < 150 with 60 units/bag insulin in TPN, rSSI q6h (range 101-115; goal 100-150) - 0 units of SS insulin required yesterday - Levemir 10 units BID per CCM started 6/18  Electrolytes: K (target >/= 4.0) is 3.7 yesterday . Magnesium 2.1 (target >/= 2.0), CCa 9.8; all lytes WNL  Renal: SCr has been WNL, BUN 29 slightly elevated but stable. 2350 mL UOP last 24hr LFTs / TGs: AST/ALT 64/68 slightly elevated; T.bili, alk phos WNL. TG (65) remain WNL off of propofol. Prealbumin / albumin: prealbumin 8.3 (7/5) 9.6 (6/28), 9.0 (6/21); albumin 1.4 (7/6) Intake / Output; M[facility]F: NS at [facility]; NG output not charted; UOP 1.2L GI Imaging:  - 6/21 Upper GI series: small leak from superior margin of duodenal bulb. - 6/23 CT abdomen: 4.3 cm air-fluid collection in the right upper quadrant of the abdomen between the duodenal bulb and gallbladder,not containing enteric contrast. No residual leak from the duodenal bulb identified. - 7/1 Abdominal CT: large irregular high density noted most consistent with enlarging subcutaneous or IM hematoma - 7/6 Upper GI showed no leak.  Will need repeat sinogram without fistula to start oral feeds to maximize chance of this healing.   Surgeries / Procedures:  - 6/14 Exploratory laparotomy which revealed perforated duodenal ulcer - 6/24 RUQ drain placed > 10 ml > cultured - 6/25 removal R abd JP drain   Central access:  PICC Line 6/15 TPN start date: 6/15  Nutritional Goals: per RD  recommendation on 6/30: Kcal:  2200-2400 Protein:  115-125 grams Fluid:  >2L/d  Goal TPN rate is 95 mL/hr (provides 121 g of protein and 2262 kcals per day)  - Will order potassium chloride 10 meq IV x 4   Current Nutrition:  NPO   Plan: at 18:00 tonight  Continue TPN at 95 mL/hr at 1800, goal rate   Electrolytes in TPN:   85 mEq/L of Na, 85 mEq/L of K , 4 mEq/L of Ca, increase 12 mEq/L of Magnesium, and 15 mmol/L of Phos. Cl:Ac ratio max Ac  Standard MVI and trace elements to TPN  Continue resistant q6h SSI, levemir and adjust as needed   Continue 60 units of regular insulin/day in TPN   Continue MIVF at [facility] mL/hr   Magnesium, phosphorus, BMP with AM labs   Monitor TPN labs on Mon/Thurs   Royetta Asal, PharmD, BCPS 11/05/2019 9:27 AM

## 2019-11-05 NOTE — Progress Notes (Signed)
Tolerating higher beta blocker dose. Markedly improved burden of ventricular arrhythmia. Still has frequent ventricular bigeminy, but NSVT has abated. Continue current meds. Eager to transition to PO beta blocker when able.

## 2019-11-06 ENCOUNTER — Telehealth: Payer: Self-pay | Admitting: Cardiology

## 2019-11-06 ENCOUNTER — Inpatient Hospital Stay (HOSPITAL_COMMUNITY): Payer: Medicare Other

## 2019-11-06 LAB — CBC
HCT: 26.5 % — ABNORMAL LOW (ref 39.0–52.0)
Hemoglobin: 8.4 g/dL — ABNORMAL LOW (ref 13.0–17.0)
MCH: 26.9 pg (ref 26.0–34.0)
MCHC: 31.7 g/dL (ref 30.0–36.0)
MCV: 84.9 fL (ref 80.0–100.0)
Platelets: 368 10*3/uL (ref 150–400)
RBC: 3.12 MIL/uL — ABNORMAL LOW (ref 4.22–5.81)
RDW: 17 % — ABNORMAL HIGH (ref 11.5–15.5)
WBC: 9.8 10*3/uL (ref 4.0–10.5)
nRBC: 0 % (ref 0.0–0.2)

## 2019-11-06 LAB — BASIC METABOLIC PANEL
Anion gap: 8 (ref 5–15)
BUN: 28 mg/dL — ABNORMAL HIGH (ref 8–23)
CO2: 24 mmol/L (ref 22–32)
Calcium: 8 mg/dL — ABNORMAL LOW (ref 8.9–10.3)
Chloride: 105 mmol/L (ref 98–111)
Creatinine, Ser: 0.63 mg/dL (ref 0.61–1.24)
GFR calc Af Amer: >60 mL/min (ref >60)
GFR calc non Af Amer: >60 mL/min (ref >60)
Glucose, Bld: 96 mg/dL (ref 70–99)
Potassium: 4.3 mmol/L (ref 3.5–5.1)
Sodium: 137 mmol/L (ref 135–145)

## 2019-11-06 LAB — GLUCOSE, CAPILLARY
Glucose-Capillary: 90 mg/dL (ref 70–99)
Glucose-Capillary: 92 mg/dL (ref 70–99)
Glucose-Capillary: 93 mg/dL (ref 70–99)
Glucose-Capillary: 94 mg/dL (ref 70–99)

## 2019-11-06 LAB — HEPARIN LEVEL (UNFRACTIONATED)
Heparin Unfractionated: 0.45 IU/mL (ref 0.30–0.70)
Heparin Unfractionated: 0.61 IU/mL (ref 0.30–0.70)
Heparin Unfractionated: 0.77 IU/mL — ABNORMAL HIGH (ref 0.30–0.70)

## 2019-11-06 LAB — H PYLORI, IGM, IGG, IGA AB
H Pylori IgG: 2.73 Index Value — ABNORMAL HIGH (ref 0.00–0.79)
H. Pylogi, Iga Abs: 30.9 units — ABNORMAL HIGH (ref 0.0–8.9)
H. Pylogi, Igm Abs: <9 units (ref 0.0–8.9)

## 2019-11-06 LAB — MAGNESIUM: Magnesium: 2.1 mg/dL (ref 1.7–2.4)

## 2019-11-06 LAB — PHOSPHORUS: Phosphorus: 2.6 mg/dL (ref 2.5–4.6)

## 2019-11-06 MED ORDER — PROMETHAZINE HCL 25 MG/ML IJ SOLN
12.5000 mg | Freq: Once | INTRAMUSCULAR | Status: AC
  Administered 2019-11-06: 12.5 mg via INTRAVENOUS

## 2019-11-06 MED ORDER — IOHEXOL 300 MG/ML  SOLN
7.0000 mL | Freq: Once | INTRAMUSCULAR | Status: AC | PRN
  Administered 2019-11-06: 7 mL

## 2019-11-06 MED ORDER — TRAVASOL 10 % IV SOLN
INTRAVENOUS | Status: AC
  Filled 2019-11-06: qty 1208.4

## 2019-11-06 NOTE — Telephone Encounter (Signed)
No signed DPR on file that I have been able to locate in pt's chart.  Attempted to call - phone rang multiple times with no answer and no VM.

## 2019-11-06 NOTE — Progress Notes (Signed)
PHARMACY - TOTAL PARENTERAL NUTRITION CONSULT NOTE   Indication: Prolonged ileus  Patient Measurements: Height: '5\' 11"'  (180.3 cm) Weight: 93.4 kg (205 lb 14.6 oz) IBW/kg (Calculated) : 75.3 TPN AdjBW (KG): 93.6 Body mass index is 28.72 kg/m. Usual Weight: 90.7 kg   Assessment:  Pharmacy is consulted to start TPN in 86 yo male with prolonged ileus. Pt underwent exploratory laparotomy on 6/14 which revealed perforated duodenal ulcer.   Glucose / Insulin: hx of DM - on metformin PTA.  - CBGs < 150 with 60 units/bag insulin in TPN, rSSI q6h (range 101-115; goal 100-150) - 1 units of SS insulin required yesterday - Levemir 10 units BID per CCM started 6/18  Electrolytes: K (target >/= 4.0) is 4.3 . Magnesium 2.1 (target >/= 2.0), CCa 9.9; all lytes WNL  Renal: SCr has been WNL, BUN 28 slightly elevated but stable. 2750 mL UOP last 24hr LFTs / TGs: AST/ALT 64/68 slightly elevated; T.bili, alk phos WNL. TG (65) remain WNL off of propofol. Prealbumin / albumin: prealbumin 8.3 (7/5) 9.6 (6/28), 9.0 (6/21); albumin 1.6 (7/8) Intake / Output; MIVF: NS at [facility]; NG removed; GI Imaging:  - 6/21 Upper GI series: small leak from superior margin of duodenal bulb. - 6/23 CT abdomen: 4.3 cm air-fluid collection in the right upper quadrant of the abdomen between the duodenal bulb and gallbladder,not containing enteric contrast. No residual leak from the duodenal bulb identified. - 7/1 Abdominal CT: large irregular high density noted most consistent with enlarging subcutaneous or IM hematoma - 7/6 Upper GI showed no leak.  Will need repeat sinogram without fistula to start oral feeds to maximize chance of this healing.   Surgeries / Procedures:  - 6/14 Exploratory laparotomy which revealed perforated duodenal ulcer - 6/24 RUQ drain placed > 10 ml > cultured - 6/25 removal R abd JP drain   Central access:  PICC Line 6/15 TPN start date: 6/15  Nutritional Goals: per RD recommendation on 6/30: Kcal:   2200-2400 Protein:  115-125 grams Fluid:  >2L/d  Goal TPN rate is 95 mL/hr (provides 121 g of protein and 2262 kcals per day)  Current Nutrition:  NPO   Plan: at 18:00 tonight  Continue TPN at 95 mL/hr at 1800, goal rate   Electrolytes in TPN:   85 mEq/L of Na, 85 mEq/L of K , 4 mEq/L of Ca, 12 mEq/L of Magnesium, and 15 mmol/L of Phos. Cl:Ac ratio max Ac  Standard MVI and trace elements to TPN  Continue resistant q6h SSI, levemir and adjust as needed   Continue 60 units of regular insulin/day in TPN   Continue M[facility]F at [facility] mL/hr   Magnesium, phosphorus, BMP with AM labs   Monitor TPN labs on Mon/Thurs   Royetta Asal, PharmD, BCPS 11/06/2019 10:43 AM

## 2019-11-06 NOTE — Progress Notes (Signed)
Marked reduction in ventricular ectopy. No further episodes of NSVT in last 24 h. When able to take PO, switch from IV metoprolol to metoprolol tartrate 50 mg twice daily. Please call Cardiology with other questions over the weekend.

## 2019-11-06 NOTE — Progress Notes (Signed)
Patient ID: Matthew Schwartz, male   DOB: 01/09/1933, 86 y.o.   MRN: [id number]    25 Days Post-Op  Subjective: Denies abdominal pain or distention.  Denies nausea.  States he hasn't been out of bed.  ROS: See above, otherwise other systems negative  Objective: Vital signs in last 24 hours: Temp:  [97.4 F (36.3 C)-99 F (37.2 C)] 99 F (37.2 C) (07/09 0706) Pulse Rate:  [67-83] 67 (07/09 0700) Resp:  [19-26] 19 (07/09 0700) BP: (126-169)/(53-96) 152/96 (07/09 0400) SpO2:  [95 %-99 %] 96 % (07/09 0700) Weight:  [93.4 kg] 93.4 kg (07/09 0500) Last BM Date: 11/05/19  Intake/Output from previous day: 07/08 0701 - 07/09 0700 In: 3320.8 [I.V.:2450.9; IV Piggyback:865] Out: 2875 [Urine:2750; Drains:50; Stool:75] Intake/Output this shift: No intake/output data recorded.  PE: Gen: frail Abd: soft, but distended, midline wound is clean and packed, +BS, seems only minimally tender, RUQ drain with tan purulent appearing output.  Lab Results:  Recent Labs    11/05/19 0618 11/06/19 0536  WBC 9.7 9.8  HGB 8.4* 8.4*  HCT 26.8* 26.5*  PLT 366 368   BMET Recent Labs    11/05/19 0618 11/06/19 0536  NA 138 137  K 3.7 4.3  CL 106 105  CO2 24 24  GLUCOSE 95 96  BUN 29* 28*  CREATININE 0.69 0.63  CALCIUM 7.9* 8.0*   PT/INR No results for input(s): LABPROT, INR in the last 72 hours. CMP     Component Value Date/Time   NA 137 11/06/2019 0536   NA 137 11/02/2016 1509   K 4.3 11/06/2019 0536   K 4.4 11/02/2016 1509   CL 105 11/06/2019 0536   CO2 24 11/06/2019 0536   CO2 27 11/02/2016 1509   GLUCOSE 96 11/06/2019 0536   GLUCOSE 153 (H) 11/02/2016 1509   BUN 28 (H) 11/06/2019 0536   BUN 16.7 11/02/2016 1509   CREATININE 0.63 11/06/2019 0536   CREATININE 0.9 11/02/2016 1509   CALCIUM 8.0 (L) 11/06/2019 0536   CALCIUM 9.1 11/02/2016 1509   PROT 6.4 (L) 11/05/2019 0618   PROT 7.0 11/02/2016 1509   ALBUMIN 1.6 (L) 11/05/2019 0618   ALBUMIN 3.6 11/02/2016 1509   AST 64 (H)  11/05/2019 0618   AST 15 11/02/2016 1509   ALT 68 (H) 11/05/2019 0618   ALT 12 11/02/2016 1509   ALKPHOS 72 11/05/2019 0618   ALKPHOS 58 11/02/2016 1509   BILITOT 0.3 11/05/2019 0618   BILITOT 0.71 11/02/2016 1509   GFRNONAA >60 11/06/2019 0536   GFRAA >60 11/06/2019 0536   Lipase     Component Value Date/Time   LIPASE 44 10/12/2019 0320       Studies/Results: No results found.  Anti-infectives: Anti-infectives (From admission, onward)   Start     Dose/Rate Route Frequency Ordered Stop   11/06/19 1800  fluconazole (DIFLUCAN) IVPB 400 mg     Discontinue     400 mg 100 mL/hr over 120 Minutes Intravenous Every 24 hours 11/05/19 1850     10/31/19 2200  vancomycin (VANCOREADY) IVPB 1250 mg/250 mL  Status:  Discontinued        1,250 mg 166.7 mL/hr over 90 Minutes Intravenous Every 12 hours 10/31/19 1602 11/03/19 1152   10/29/19 2200  meropenem (MERREM) 1 g in sodium chloride 0.9 % 100 mL IVPB        1 g 200 mL/hr over 30 Minutes Intravenous Every 8 hours 10/29/19 2008 11/05/19 2207   10/29/19 2200  vancomycin (VANCOCIN)  IVPB 1000 mg/200 mL premix  Status:  Discontinued        1,000 mg 200 mL/hr over 60 Minutes Intravenous Every 12 hours 10/29/19 2008 10/31/19 1602   10/25/19 1800  fluconazole (DIFLUCAN) IVPB 400 mg  Status:  Discontinued        400 mg 100 mL/hr over 120 Minutes Intravenous Every 24 hours 10/24/19 1750 11/05/19 1850   10/24/19 1830  fluconazole (DIFLUCAN) IVPB 800 mg  Status:  Discontinued        800 mg 200 mL/hr over 120 Minutes Intravenous  Once 10/24/19 1750 10/24/19 1755   10/24/19 1830  fluconazole (DIFLUCAN) IVPB 800 mg        800 mg 100 mL/hr over 240 Minutes Intravenous Every 24 hours 10/24/19 1756 10/24/19 2240   10/12/19 2000  piperacillin-tazobactam (ZOSYN) IVPB 3.375 g  Status:  Discontinued        3.375 g 12.5 mL/hr over 240 Minutes Intravenous Every 8 hours 10/12/19 1911 10/29/19 1945   10/12/19 1708  ceFAZolin (ANCEF) 2-4 GM/100ML-% IVPB        Note to Pharmacy: Bennye Alm   : cabinet override      10/12/19 1708 10/12/19 1849       Assessment/Plan NSTEMI/NSVT-Heparin drip/nitro ointment/beta-blocker Acute systolic HF - EF 17-61%/YWVPX 1 diastolic function DVT - LUE 10/17/19 Remote hx of prostate cancer s/p radiation HTN- off pressors HLD GERD- Protonix DM2 VDRF-extubated 6/17 PCCM sign off 6/25 VT-on amio drip/DCed 6/20 Hyperglycemia - SSI Hypokalemia  ABL anemia stable at 8.4 SevereProtein calorie malnutrition on TNA Anemia   Perforated Duodenal Ulcer S/p Exploratory laparotomy, omental patch repair of duodenal ulcer - Dr. [name] - 6/14/2021POD#18 Postop leak Postop IR fistula tract to the duodenal bulb - no leak on UGI yesterday but question fistula to IR drain -for fistulagram today with IR -NGT removed yesterday -will need swallow eval.  Will await fistulagram first today and plan swallow eval after determination if he has a fistula or not. -mobilize with PT/OT -IS/pulm toilet -BID WD dressing changes to midline wound  FEN -NPO, IVF, TPN VTE - SCDs, heparindrip ID -Zosyn6/14- 10/29/19;Diflucan 6/26>>day10; Meropenem 7/1>> 7/8, completed Foley - condom cath type device Follow-Up - Dr. [name]   LOS: 25 days    Henreitta Cea , [facility] 11/06/2019, 9:34 AM Please see Amion for pager number during day hours 7:00am-4:30pm or 7:00am -11:30am on weekends

## 2019-11-06 NOTE — Progress Notes (Signed)
Pharmacy Brief Note  86 y/o M on heparin drip for ACS and left upper extremity DVT.   O: HL 0.61  A: HL at goal with no bleeding reported.   P: Continue heparin drip at 1200 units/hr and f/u AM labs.   Ulice Dash, PharmD, BCPS

## 2019-11-06 NOTE — Progress Notes (Signed)
Pharmacy Brief Note - Anticoagulation Follow Up:  Pt on heparin drip for VTE treatment.   Assessment:  HL = 0.77 is SUPRA-therapeutic on heparin infusion at 1350 units/hr  Confirmed with RN that heparin infusing at correct rate with no interruptions.  She states lab was drawn correctly from PICC line with heparin infusing peripherally in opposite arm.  No signs of bleeding. Hematoma stable at 6pm bandage change.   Goal: HL 0.3-0.7  Plan:  Decrease heparin to 1200 units/hr  Re-check HL in 8 hours  CBC, HL daily while on heparin  Netta Cedars, PharmD 11/06/19  @12 :58 AM

## 2019-11-06 NOTE — Telephone Encounter (Signed)
Patient's wife returning call. 

## 2019-11-06 NOTE — Progress Notes (Signed)
[place] for Heparin Indication: chest pain/ACS, left upper extremity DVT  Allergies  Allergen Reactions  . Losartan Potassium Other (See Comments)    Unknown reaction that happened 15 years ago   Patient Measurements: Height: 5\' 11"  (180.3 cm) Weight: 93.4 kg (205 lb 14.6 oz) IBW/kg (Calculated) : 75.3 Heparin Dosing Weight: 90 kg  Vital Signs: Temp: 99 F (37.2 C) (07/09 0706) Temp Source: Axillary (07/09 0706) BP: 152/96 (07/09 0400) Pulse Rate: 67 (07/09 0700)  Labs: Recent Labs    11/04/19 0609 11/04/19 9242 11/05/19 0618 11/05/19 0618 11/05/19 1735 11/05/19 2340 11/06/19 0536 11/06/19 1025  HGB 8.5*   < > 8.4*  --   --   --  8.4*  --   HCT 27.4*  --  26.8*  --   --   --  26.5*  --   PLT 373  --  366  --   --   --  368  --   HEPARINUNFRC 0.43   < > 0.71*   < > 0.30 0.77*  --  0.45  CREATININE 0.80  --  0.69  --   --   --  0.63  --    < > = values in this interval not displayed.   Estimated Creatinine Clearance: 80.2 mL/min (by C-G formula based on SCr of 0.63 mg/dL).  Assessment: 86 yo male with perforated duodenal ulcer s/p omental patch repair.  Pharmacy consulted to dose IV heparin for NSTEMI as well as for acute deep vein thrombosis involving the left axillary vein, surrounding the PICC line. Findings consistent with acute superficial vein thrombosis involving the left cephalic vein. Patient with anemia of critical illness/post-op s/p transfusion 6/27.   Significant events: 6/27: hold for Hgb 6.2 6/28: bloody stools, small amount of bleeding from surgical incision and JP drain  6/29: resume heparin 7/1: d/c heparin 2/2 hematoma at surgical site - of no consequence per surgery 7/2: d/w MD - continue to hold, reconsider resuming in next several days 7/3: resume heparin  Today, 11/06/2019  HL = 0.45   therapeutic on heparin infusion of 1200 units/hr  CBC: Hgb 8.4 low and relatively stable; Plt WNL  No issue with IV  infusion reported. Pt does have hematoma at surgical site - surgery aware, no worsening. No signs of overt bleeding.  Goal of Therapy:  Heparin level 0.3-0.7 units/ml Monitor platelets by anticoagulation protocol: Yes   Plan:   Continue heparin infusion to rate of 1200 units/hr  Obtain confirmatory HL 8 hours after rate change   Check HL, CBC with AM labs tomorrow and daily while on heparin infusion  Monitor for signs/symptoms of bleeding  Royetta Asal, PharmD, BCPS 11/06/2019 11:25 AM

## 2019-11-06 NOTE — Progress Notes (Signed)
Drain injection performed in [place] today, reviewed with Dr. Anselm Pancoast. Demonstrates fistulous connection.  Brynda Greathouse, MS RD PA-C 4:21 PM

## 2019-11-06 NOTE — Telephone Encounter (Signed)
   Pt's wife called, she said pt is in the hospital and they weren't sure if he will be discharged to go home or transferred to a care facility. The pt is concerned on how the arrangement for his heart care, he only wants to see Dr. Marlou Porch.

## 2019-11-06 NOTE — Telephone Encounter (Signed)
Called and spoke with wife, who is very difficult to understand d/t her heavy accent.  She is asking if the pt needs a stent if Dr Marlou Porch would be the MD that would do the procedure.  Advised if it is determined pt needs a stent Dr Marlou Porch does not do that procedure however one of his partners who do that procedure would be contacted.  Wife then gave the phone to the daughter who is asking if the pt has a cardiac emergency who would take care of him.  Advised if pt has a cardiac emergency while in the hospital, cardiology would be contacted.  At not point was any specific information regarding the patient was discussed with either the wife or daughter.   At this point, pt remains in the ICU at [facility].  Further placement once pt is well enough is to be determined.  Daughter had no other questions or concerns.

## 2019-11-06 NOTE — Progress Notes (Signed)
PROGRESS NOTE    Matthew Schwartz  LFY:[id number] DOB: 1932/10/10 DOA: 10/12/2019 PCP: [name], MD   Chief Complaint  Patient presents with   Abdominal Pain   Vomiting    Brief Narrative:  [name] NIDDM, HLD, GERD, irritable bowel syndrome (constipation) status post left knee replacement 2009, PTCA 2000-->RCA well as prior prostate CA, former smoker Admit 10/12/2019 severe abdominal pain-ex lap 10/12/2019-perforated duodenal ulcer and remained on vent NSVT postop-echo showed Systolic HF--was on pressors and cardiology consulted-eventually extubated 6/17 + fevers 6/23= postoperative abscess-IR  Placed abd perc drain 6/24--growing Candida /on diflucan On 6/30 upper GI series were performed-and repeated Unfortunately spiked a fever without cause-found to have on CT 7/1 enlarging hematoma General surgery felt this was minimal and not the cause for his fever He has not had high-grade fever since his Antibiotics were adjusted 7/1 vancomycin and Primaxin but continues to have low-grade Heparin was held temporarily until 7/3 and restarted cautiously 7/3 after risk-benefit discussion with family  He has stabilized to some degree on stepdown unit he has had some pain and swelling in his upper extremities likely from the prior DVT in his left upper extremity-his right arm was imaged for DVT and found to be negative He has a long road ahead and TOC is working with patient and family to figure out next steps in terms of placement he may be able to eventually go to an [place]:   Principal Problem:   Severe sepsis with septic shock ([place]) Active Problems:   Perforated duodenal ulcer ([place])   Peritonitis ([place])   HTN (hypertension)   Malignant neoplasm of prostate ([place])   Hyperlipidemia   Obstipation   HLD (hyperlipidemia)   Diabetes mellitus type 2, uncontrolled, with complications ([place])   Perforated bowel ([place])   Acute respiratory insufficiency    Elevated troponin level   Acute respiratory failure with hypoxemia (H[facility])   PVCs (premature ventricular contractions)   Bilateral atelectasis   Acute abdominal pain   Palliative care by specialist   Goals of care, counseling/discussion   General weakness   Epigastric pain   Gastric ulcer   Hypokalemia   NSVT (nonsustained ventricular tachycardia) ([place])   Diarrhea   NSTEMI (non-ST elevated myocardial infarction) ([place])   Acute deep vein thrombosis (DVT) of left upper extremity ([place])   Acute systolic CHF (congestive heart failure) ([place])  1 septic shock secondary to peritonitis secondary to perforated duodenal ulcer Status post expiratory laparotomy, omental patch repair of duodenal ulcer per Dr. 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  Currently afebrile.  Normal white count.  Patient noted to have upper GI series 10/28/2019 showing persistent small leak with repeat upper GI series done on 11/03/2018 one of the duodenal bulb likely related to prior surgical repair.  No definite persistent leak identified on today's examination.  Patient noted to have fluid collections 10/21/2019 with drain placement with cultures positive for Candida.  Status post drain placement.  On IV fluconazole.  Patient noted to have a T-max of 102.1 on 7/1/ 2021 with a change in antibiotics from IV vancomycin(s/p 5 days) to IV Merrem.  Blood cultures obtained with no growth to date.  Currently afebrile with a normal white count.  Dr. [name] discussed with general surgery, Dr. [name] on 10/30/2019 and was felt that enlarging hematoma was not source of infection as it would drain into the midline wound.  Continue IV PPI, bowel rest, NG  tube, TPN.  Patient s/p 8 days of IV Merrem.  IV of Merrem has been discontinued.  Patient afebrile.  Loose stools have decreased.  C. difficile PCR was negative.  Patient for fistulogram today.  Right upper quadrant drain with some purulent discharge noted.   Continue IV fluconazole while drain in place as cultures were positive for Candida albicans.  Mobilize.  Check a KUB due to abdominal discomfort and abdominal distention.  NG tube removed yesterday.  Per general surgery.  IR following.    2.  Acute hypoxic respiratory failure Status post extubation 10/15/2019.  NG tube has been discontinued this morning.  Patient currently euvolemic.  95-98% on room air.   3.  Acute systolic heart failure/non-STEMI/coronary artery disease 2000/NSVT Euvolemic.  Blood pressure stable.  Patient on clonidine patch and IV beta-blocker.  Patient noted to have a 5-6 beat run of nonsustained V. tach however asymptomatic on 11/04/2019.  Potassium at 4.3.  Magnesium at 2.1.  Phosphorus at 2.6.  IV beta-blocker dose has been increased to 10 mg every 4 hours per cardiology.  Cardiology following peripherally.  Keep potassium > 4, keep magnesium > 2.   4.  Left upper extremity DVT cephalic/axillary vein Likely secondary to PICC line.  On IV heparin.  Follow.  5.  Right upper extremity swelling and pain and discomfort Heparin initially discontinued on 10/29/2019 after enlarging hematoma.  Heparin was resumed without a bolus on 10/31/2019 after Dr. [name] discussed with a specialist.  Right upper extremity discomfort initially felt to be secondary to a DVT however Dopplers negative.  Hemoglobin currently stable at 8.4.  Follow H&H.  6.  Well-controlled diabetes mellitus type 2 Hemoglobin A1c 6.6.  Blood glucose level of 94 this morning.  Patient noted not to be a diabetic prior to admission and elevated CBGs likely secondary to TPN.  Sliding scale insulin.  7.  Anemia of critical illness Status post transfusion of 2 units packed red blood cells.  H&H stable at 8.4.  Follow.  Transfusion threshold hemoglobin < 7.  8.  Malignancy of the prostate Outpatient follow-up.  9.  Tobacco abuse Tobacco cessation.  10.  Diarrhea Per RN patient with voluminous loose stools noted in rectal  pouch over the past 2 to 3 days.  Decrease amount of voluminous loose stool over the past 24 hours.  C. difficile PCR was negative.  Patient with some abdominal distention with hyperactive bowel sounds and patient with complaints of feeling gassy.  Check a KUB.  Follow.    DVT prophylaxis: Heparin Code Status: Full Family Communication: Updated patient and daughter at bedside. Disposition:   Status is: Inpatient    Dispo:  Patient From: [facility]  Planned Disposition: [facility]  Expected discharge date: 11/27/19  Medically stable for discharge: No patient currently n.p.o., on TPN, on IV antifungal, abdominal distention, patient for fistulogram today, significant weakness.  Patient to remain in the stepdown unit today.  Will likely require SNF.        Consultants:   Palliative care: Dr. Rowe Pavy 10/24/2019  Cardiology: Dr. Margaretann Loveless 10/13/2019  General surgery: Dr. [name] 10/12/2019  Gastroenterology: Dr. Therisa Doyne 10/12/2019  PCCM: Dr. Halford Chessman 10/12/2019  Procedures:  CT abdomen and pelvis 10/21/2019, 10/29/2019  CT angiogram abdomen and pelvis 10/11/2019  Upper GI series 10/19/2019, 10/28/2019, 11/03/2019  2D echo 10/13/2019, 10/16/2019  Right upper extremity Doppler 11/02/2019  Left upper extremity Dopplers 10/17/2019  CT-guided drain placement right upper quadrant fluid collection per interventional radiology, Dr. Earleen Newport  10/22/2019  Exploratory laparotomy, omental patch repair of duodenal ulcer per Dr. [name] 10/12/2019  Right IJ CVL 10/12/2019>>>> 10/13/2019  Right radial A-line 10/12/2019>>>> 10/15/2019  ETT 10/12/2019>>>> 10/15/2019  Left upper extremity PICC 10/14/2019  Significant Hospital Events   6/14 Admit, exploratory laparotomy, omental patch repair of duodenal ulcer 6/16 Off vasopressors, less drainage from abd drain / clearing  6/17 extubated, weak cough, added high flow oxygen 6/18 start heparin gtt 6/20 d/c amiodarone 6/23 new fluid  collection on CT abd > Drained by IR 6/24 To [facility] 6/25 PCCM sign off 7/01 back to ICU with fever, tachypnea >> PCCM asked to reassess 7/2comfortable this morning     Significant Diagnostic Tests:   CT angio abd/pelvis 6/13 >> mild atherosclerosis, no significant arterial stenosis of mesenteric vasculature, no findings of bowel ischemia, colonic diverticulosis w/o diverticulitis, b/l renal cysts, 12 mm lesion upper pole Rt kidney  ECHO 6/15 >> poor image quality, appears to be regional wall motion variability but confident wall motion analysis is impossible even after contrast administration, LVEF ~ 82-42%, Grade I diastolic dysfunction, RV systolic function is moderately reduced, RV is mildly enlarged  6/19 venous doppler LUE>>acute deep vein thrombosis involving the left axillary vein, surrounding the PICC line. Findings consistent with acute superficial vein thrombosis involving the left cephalic vein.  UGI 6/21 >> small leak from duodenum  CT ABD/Pelvis 6/23 >>interval midline abdominal incision with incomplete skin surgical closure, probable small hematomas within the anterior abdominal wall and the omentum. Nonspecific 4.3 cm air-fluid collection in the RUQ of the abdomen between the duodenal bulb and gallbladder, not containing enteric contrast. No residual leak from the duodenal bulb identified. New small bilateral pleural effusions with dependent L>R lower lobe opacities  Upper GI series 6/30 >>Small puckered stump of a leak at the site of the prior elongatedeak. Findings suggest resolving but persistent extraluminalcollection.  CT abd/pelvis 7/01 >>Anterior midline surgical incision is again noted. Large irregular high density is noted in this area most consistent with enlarging subcutaneous or intramuscular hematoma. Interval placement of pigtail drainage catheter in the area of fluid collection noted on prior exam in the right upper quadrant. Fluid collection is  significantly decompressed compared to prior exam. Micro Data:  SARS CoV2 6/13 >> negative RUQ fluid collection 6/24 >> moderate Candida albicans Blood 6/25 >>negative Blood culture 10/29/2019>>> negative     Antimicrobials:   IV Merrem 10/29/2019>>>>>> 11/05/2019  IV Zosyn 10/12/2019>>>> 10/29/2019  IV vancomycin 10/29/2019>>>> 11/03/2019  IV fluconazole 10/25/2019>>>>   Subjective: Patient laying in bed.  Denies any chest pain or shortness of breath.  Complaining of abdominal distention and abdominal discomfort from feeling gassy.  Still with liquid stool in rectal pouch however decreasing amount.  Per RN, Imodium not given last night as patient noted to be coughing when trying to swallow.  Objective: Vitals:   11/06/19 0400 11/06/19 0500 11/06/19 0700 11/06/19 0706  BP: (!) 152/96     Pulse: 73  67   Resp: (!) 22  19   Temp:    99 F (37.2 C)  TempSrc:    Axillary  SpO2: 95%  96%   Weight:  93.4 kg    Height:        Intake/Output Summary (Last 24 hours) at 11/06/2019 0908 Last data filed at 11/06/2019 0452 Gross per 24 hour  Intake 3082.65 ml  Output 2855 ml  Net 227.65 ml   Filed Weights   11/04/19 0500 11/05/19 0500 11/06/19 0500  Weight: 91 kg  92.9 kg 93.4 kg    Examination:  General exam: NAD  Respiratory system: CTAB anterior lung fields.  No wheezes, no crackles, no rhonchi.  Normal respiratory effort.  Speaking in full sentences.  Cardiovascular system: RRR no murmurs rubs or gallops.  No JVD.  No lower extremity edema.   Gastrointestinal system: Abdomen is distended, soft, hyperactive bowel sounds, bandage on midline wound, right upper quadrant drain with purulent discharge noted.  No rebound.  No guarding.  Some diffuse tenderness to palpation.  Central nervous system: Alert and oriented. No focal neurological deficits. Extremities: Symmetric 5 x 5 power. Skin: No rashes, lesions or ulcers Psychiatry: Judgement and insight appear normal. Mood & affect  appropriate.     Data Reviewed: I have personally reviewed following labs and imaging studies  CBC: Recent Labs  Lab 11/01/19 0500 11/01/19 0500 11/02/19 0540 11/03/19 0430 11/04/19 0609 11/05/19 0618 11/06/19 0536  WBC 10.3   < > 13.6* 9.9 9.9 9.7 9.8  NEUTROABS 7.6  --  10.3*  --   --   --   --   HGB 8.7*   < > 9.1* 8.0* 8.5* 8.4* 8.4*  HCT 27.4*   < > 28.7* 25.8* 27.4* 26.8* 26.5*  MCV 91.3   < > 84.7 96.3 85.1 85.4 84.9  PLT 351   < > 367 284 373 366 368   < > = values in this interval not displayed.    Basic Metabolic Panel: Recent Labs  Lab 11/02/19 0540 11/02/19 0540 11/03/19 0430 11/03/19 0521 11/04/19 0609 11/05/19 0618 11/06/19 0536  NA 137  --   --  136 137 138 137  K 4.1  --   --  4.1 4.4 3.7 4.3  CL 107  --   --  105 106 106 105  CO2 22  --   --  23 24 24 24   GLUCOSE 104*  --   --  135* 181* 95 96  BUN 27*  --   --  29* 31* 29* 28*  CREATININE 0.69  --   --  0.68 0.80 0.69 0.63  CALCIUM 8.0*  --   --  7.6* 7.6* 7.9* 8.0*  MG 2.2   < > 4.6* 2.3 2.5* 2.1 2.1  PHOS 2.7   < > 8.3* 2.7 3.2 2.7 2.6   < > = values in this interval not displayed.    GFR: Estimated Creatinine Clearance: 80.2 mL/min (by C-G formula based on SCr of 0.63 mg/dL).  Liver Function Tests: Recent Labs  Lab 10/31/19 0245 11/01/19 0645 11/02/19 0540 11/03/19 0521 11/05/19 0618  AST 16 27 49* 48* 64*  ALT 20 26 44 48* 68*  ALKPHOS 56 64 86 82 72  BILITOT 0.8 0.7 0.6 0.7 0.3  PROT 7.0 6.9 7.1 6.2* 6.4*  ALBUMIN 1.6* 1.6* 1.5* 1.4* 1.6*    CBG: Recent Labs  Lab 11/04/19 0546 11/04/19 1122 11/04/19 1835 11/05/19 2339 11/06/19 0546  GLUCAP 115* 122* 118* 105* 94     Recent Results (from the past 240 hour(s))  Culture, blood (single)     Status: None   Collection Time: 10/29/19  7:06 PM   Specimen: BLOOD  Result Value Ref Range Status   Specimen Description   Final    BLOOD RIGHT ANTECUBITAL Performed at [facility], [place] [address]  [place], [place] [address]    Special Requests   Final    BOTTLES DRAWN AEROBIC ONLY Blood Culture results may not be optimal  due to an inadequate volume of blood received in culture bottles Performed at [facility] [address], [place], [place] [address]    Culture   Final    NO GROWTH 5 DAYS Performed at [facility], [place] [address] [place], [place] [address]    Report Status 11/03/2019 FINAL  Final  C Difficile Quick Screen (NO PCR Reflex)     Status: None   Collection Time: 11/05/19 12:17 PM   Specimen: STOOL  Result Value Ref Range Status   C Diff antigen NEGATIVE NEGATIVE Final   C Diff toxin NEGATIVE NEGATIVE Final   C Diff interpretation NEGATIVE  Final    Comment: Performed at [facility], [place] [address], [place], [place] [address]         Radiology Studies: No results found.      Scheduled Meds:  chlorhexidine  15 mL Mouth Rinse BID   Chlorhexidine Gluconate Cloth  6 each Topical Daily   cloNIDine  0.2 mg Transdermal Weekly   insulin aspart  0-20 Units Subcutaneous Q6H   insulin detemir  10 Units Subcutaneous BID   lidocaine  2 patch Transdermal Q24H   mouth rinse  15 mL Mouth Rinse q12n4p   metoprolol tartrate  10 mg Intravenous Q4H   nitroGLYCERIN  1 inch Topical Q6H   pantoprazole (PROTONIX) IV  40 mg Intravenous Q12H   simethicone  160 mg Oral QID   sodium chloride flush  10-40 mL Intracatheter Q12H   sodium chloride flush  5 mL Intracatheter Q8H   sucralfate  1 g Oral BID   Continuous Infusions:  sodium chloride Stopped (11/05/19 1544)   sodium chloride Stopped (10/31/19 2220)   fluconazole (DIFLUCAN) IV     heparin 1,200 Units/hr (11/06/19 0108)   TPN ADULT (ION) 95 mL/hr at 11/05/19 1731     LOS: 25 days    Time spent: 40 minutes    Irine Seal, MD [facility]   To contact the attending provider between 7A-7P or the covering provider during after hours 7P-7A,  please log into the web site [email or web address] and access using universal [place] password for that web site. If you do not have the password, please call the hospital operator.  11/06/2019, 9:08 AM

## 2019-11-06 NOTE — TOC Progression Note (Signed)
Transition of Care [facility]) - Progression Note    Patient Details  Name: Matthew Schwartz MRN: [id number] Date of Birth: Apr 07, 1933  [facility]) CM/SW Contact  Leeroy Cha, RN Phone Number: 11/06/2019, 8:05 AM  Clinical Narrative:    Patient is awake and reactive to stimuli when spoken to.  Prognosis is guarded, palliative care is seeing. Continues to have purlent drainage from drain, mid line incision clean and dry.   Plan following for placement-[facility] referral has been done.  Family would prefer [facility] over snf but is willing to send to snf if necessary.  Patient has a great support group at home.  Expected Discharge Plan: IP Rehab Facility Barriers to Discharge: Continued Medical Work up  Expected Discharge Plan and Services Expected Discharge Plan: [place]   Discharge Planning Services: CM Consult   Living arrangements for the past 2 months: Single Family Home                                       Social Determinants of Health (SDOH) Interventions    Readmission Risk Interventions No flowsheet data found.

## 2019-11-07 LAB — CBC
HCT: 27.1 % — ABNORMAL LOW (ref 39.0–52.0)
Hemoglobin: 8.4 g/dL — ABNORMAL LOW (ref 13.0–17.0)
MCH: 26.4 pg (ref 26.0–34.0)
MCHC: 31 g/dL (ref 30.0–36.0)
MCV: 85.2 fL (ref 80.0–100.0)
Platelets: 383 10*3/uL (ref 150–400)
RBC: 3.18 MIL/uL — ABNORMAL LOW (ref 4.22–5.81)
RDW: 17 % — ABNORMAL HIGH (ref 11.5–15.5)
WBC: 9.3 10*3/uL (ref 4.0–10.5)
nRBC: 0 % (ref 0.0–0.2)

## 2019-11-07 LAB — BASIC METABOLIC PANEL
Anion gap: 4 — ABNORMAL LOW (ref 5–15)
BUN: 27 mg/dL — ABNORMAL HIGH (ref 8–23)
CO2: 25 mmol/L (ref 22–32)
Calcium: 8 mg/dL — ABNORMAL LOW (ref 8.9–10.3)
Chloride: 103 mmol/L (ref 98–111)
Creatinine, Ser: 0.7 mg/dL (ref 0.61–1.24)
GFR calc Af Amer: >60 mL/min (ref >60)
GFR calc non Af Amer: >60 mL/min (ref >60)
Glucose, Bld: 86 mg/dL (ref 70–99)
Potassium: 4.3 mmol/L (ref 3.5–5.1)
Sodium: 132 mmol/L — ABNORMAL LOW (ref 135–145)

## 2019-11-07 LAB — GLUCOSE, CAPILLARY
Glucose-Capillary: 83 mg/dL (ref 70–99)
Glucose-Capillary: 89 mg/dL (ref 70–99)
Glucose-Capillary: 81 mg/dL (ref 70–99)

## 2019-11-07 LAB — PHOSPHORUS: Phosphorus: 3.1 mg/dL (ref 2.5–4.6)

## 2019-11-07 LAB — MAGNESIUM: Magnesium: 2.2 mg/dL (ref 1.7–2.4)

## 2019-11-07 LAB — HEPARIN LEVEL (UNFRACTIONATED): Heparin Unfractionated: 0.54 IU/mL (ref 0.30–0.70)

## 2019-11-07 MED ORDER — TRAVASOL 10 % IV SOLN
INTRAVENOUS | Status: AC
  Filled 2019-11-07: qty 1208.4

## 2019-11-07 MED ORDER — GABAPENTIN 300 MG PO CAPS
300.0000 mg | ORAL_CAPSULE | Freq: Once | ORAL | Status: AC
  Administered 2019-11-07: 300 mg via ORAL
  Filled 2019-11-07: qty 1

## 2019-11-07 NOTE — Progress Notes (Signed)
[facility] for Heparin Indication: chest pain/ACS, left upper extremity DVT  Allergies  Allergen Reactions   Losartan Potassium Other (See Comments)    Unknown reaction that happened 15 years ago   Patient Measurements: Height: 5\' 11"  (180.3 cm) Weight: 93.4 kg (205 lb 14.6 oz) IBW/kg (Calculated) : 75.3 Heparin Dosing Weight: 90 kg  Vital Signs: Temp: 97.8 F (36.6 C) (07/10 0752) Temp Source: Oral (07/10 0752) BP: 150/70 (07/10 0825) Pulse Rate: 75 (07/10 0825)  Labs: Recent Labs    11/05/19 0618 11/05/19 1735 11/05/19 2340 11/06/19 0536 11/06/19 1025 11/06/19 1818 11/07/19 0505  HGB 8.4*  --   --  8.4*  --   --  8.4*  HCT 26.8*  --   --  26.5*  --   --  27.1*  PLT 366  --   --  368  --   --  383  HEPARINUNFRC 0.71*   < >   < >  --  0.45 0.61 0.54  CREATININE 0.69  --   --  0.63  --   --  0.70   < > = values in this interval not displayed.   Estimated Creatinine Clearance: 80.2 mL/min (by C-G formula based on SCr of 0.7 mg/dL).  Assessment: 86 yo male with perforated duodenal ulcer s/p omental patch repair.  Pharmacy consulted to dose IV heparin for NSTEMI as well as for acute deep vein thrombosis involving the left axillary vein, surrounding the PICC line. Findings consistent with acute superficial vein thrombosis involving the left cephalic vein. Patient with anemia of critical illness/post-op s/p transfusion 6/27.   Significant events: 6/27: hold for Hgb 6.2 6/28: bloody stools, small amount of bleeding from surgical incision and JP drain  6/29: resume heparin 7/1: d/c heparin 2/2 hematoma at surgical site - of no consequence per surgery 7/2: d/w MD - continue to hold, reconsider resuming in next several days 7/3: resume heparin  Today, 11/07/2019  HL = 0.54  therapeutic on heparin infusion of 1200 units/hr  CBC: Hgb 8.4 low and relatively stable; Plt WNL  No issue with IV infusion reported. Pt does have hematoma at  surgical site - surgery aware, no worsening. No signs of overt bleeding.  Goal of Therapy:  Heparin level 0.3-0.7 units/ml Monitor platelets by anticoagulation protocol: Yes   Plan:   Continue heparin infusion to rate of 1200 units/hr  Check HL, CBC with AM labs tomorrow and daily while on heparin infusion  Monitor for signs/symptoms of bleeding  Royetta Asal, PharmD, BCPS 11/07/2019 10:11 AM

## 2019-11-07 NOTE — Progress Notes (Signed)
PROGRESS NOTE    Matthew Schwartz  SFK:[id number] DOB: [birth year] DOA: 10/12/2019 PCP: [name], MD   Chief Complaint  Patient presents with  . Abdominal Pain  . Vomiting    Brief Narrative:  [name] NIDDM, HLD, GERD, irritable bowel syndrome (constipation) status post left knee replacement 2009, PTCA 2000-->RCA well as prior prostate CA, former smoker Admit 10/12/2019 severe abdominal pain-ex lap 10/12/2019-perforated duodenal ulcer and remained on vent NSVT postop-echo showed Systolic HF--was on pressors and cardiology consulted-eventually extubated 6/17 + fevers 6/23= postoperative abscess-IR  Placed abd perc drain 6/24--growing Candida /on diflucan On 6/30 upper GI series were performed-and repeated Unfortunately spiked a fever without cause-found to have on CT 7/1 enlarging hematoma General surgery felt this was minimal and not the cause for his fever He has not had high-grade fever since his Antibiotics were adjusted 7/1 vancomycin and Primaxin but continues to have low-grade Heparin was held temporarily until 7/3 and restarted cautiously 7/3 after risk-benefit discussion with family  He has stabilized to some degree on stepdown unit he has had some pain and swelling in his upper extremities likely from the prior DVT in his left upper extremity-his right arm was imaged for DVT and found to be negative He has a long road ahead and TOC is working with patient and family to figure out next steps in terms of placement he may be able to eventually go to an [place]:   Principal Problem:   Severe sepsis with septic shock ([place]) Active Problems:   Perforated duodenal ulcer ([place])   Peritonitis ([place])   HTN (hypertension)   Malignant neoplasm of prostate ([place])   Hyperlipidemia   Obstipation   HLD (hyperlipidemia)   Diabetes mellitus type 2, uncontrolled, with complications ([place])   Perforated bowel ([place])   Acute respiratory insufficiency    Elevated troponin level   Acute respiratory failure with hypoxemia ([facility])   PVCs (premature ventricular contractions)   Bilateral atelectasis   Acute abdominal pain   Palliative care by specialist   Goals of care, counseling/discussion   General weakness   Epigastric pain   Gastric ulcer   Hypokalemia   NSVT (nonsustained ventricular tachycardia) ([place])   Diarrhea   NSTEMI (non-ST elevated myocardial infarction) ([place])   Acute deep vein thrombosis (DVT) of left upper extremity ([place])   Acute systolic CHF (congestive heart failure) ([place])  1 septic shock secondary to peritonitis secondary to perforated duodenal ulcer Status post expiratory laparotomy, omental patch repair of duodenal ulcer per Dr. 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  Currently afebrile.  Normal white count.  Patient noted to have upper GI series 10/28/2019 showing persistent small leak with repeat upper GI series done on 11/03/2018 one of the duodenal bulb likely related to prior surgical repair.  No definite persistent leak identified on today's examination.  Patient noted to have fluid collections 10/21/2019 with drain placement with cultures positive for Candida.  Status post drain placement.  On IV fluconazole.  Patient noted to have a T-max of 102.1 on 7/1/ 2021 with a change in antibiotics from IV vancomycin(s/p 5 days) to IV Merrem.  Blood cultures obtained with no growth to date.  Currently afebrile with a normal white count.  Dr. [name] discussed with general surgery, Dr. [name] on 10/30/2019 and was felt that enlarging hematoma was not source of infection as it would drain into the midline wound.  Continue IV PPI, bowel rest, NG  tube, TPN.  Patient s/p 8 days of IV Merrem.  IV of Merrem has been discontinued.  Patient afebrile.  Loose stools have decreased.  C. difficile PCR was negative.  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   Right  upper quadrant drain intact.  Per general surgery likely need a repeat fistulogram next week to assess for closure. Continue IV fluconazole while drain in place as cultures were positive for Candida albicans.  Mobilize.  KUB unremarkable.  NG tube has been removed.  Will likely need SLP evaluation prior to p.o. intake.  General surgery recommending ongoing n.p.o. and drain for now.  Per general surgery.  IR following.    2.  Acute hypoxic respiratory failure Status post extubation 10/15/2019.  NG tube has been discontinued this morning.  Patient currently euvolemic.  96 % on room air.   3.  Acute systolic heart failure/non-STEMI/coronary artery disease 2000/NSVT Euvolemic.  Blood pressure stable.  Patient on clonidine patch and IV beta-blocker.  Patient noted to have a 5-6 beat run of nonsustained V. tach however asymptomatic on 11/04/2019.  Potassium at 4.3.  Magnesium at 2.2.  Phosphorus at 3.1.  IV beta-blocker dose has been increased to 10 mg every 4 hours per cardiology.  Cardiology following peripherally.  Keep potassium > 4, keep magnesium > 2.   4.  Left upper extremity DVT cephalic/axillary vein Likely secondary to PICC line.  On IV heparin.  Follow.  5.  Right upper extremity swelling and pain and discomfort Heparin initially discontinued on 10/29/2019 after enlarging hematoma.  Heparin was resumed without a bolus on 10/31/2019 after Dr. [name] discussed with a specialist.  Right upper extremity discomfort initially felt to be secondary to a DVT however Dopplers negative.  Hemoglobin currently stable at 8.4.  Follow H&H.  6.  Well-controlled diabetes mellitus type 2 Hemoglobin A1c 6.6.  Blood glucose level of 83 this morning.  Patient noted not to be a diabetic prior to admission and elevated CBGs likely secondary to TPN.  Sliding scale insulin.  7.  Anemia of critical illness Status post transfusion of 2 units packed red blood cells.  H&H stable at 8.4.  Follow.  Transfusion threshold  hemoglobin < 7.  8.  Malignancy of the prostate Outpatient follow-up.  9.  Tobacco abuse Tobacco cessation.  10.  Diarrhea Per RN patient with voluminous loose stools noted in rectal pouch a few days ago which has since improved.  C. difficile PCR negative.  Patient with abdominal distention, positive bowel sounds.  KUB negative.  IV antibiotics discontinued.  Follow.     DVT prophylaxis: Heparin Code Status: Full Family Communication: Updated patient and daughter at bedside. 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  Not stable for discharge.          Consultants:   Palliative care: Dr. Rowe Pavy 10/24/2019  Cardiology: Dr. Margaretann Loveless 10/13/2019  General surgery: Dr. [name] 10/12/2019  Gastroenterology: Dr. Therisa Doyne 10/12/2019  PCCM: Dr. Halford Chessman 10/12/2019  Procedures:  CT abdomen and pelvis 10/21/2019, 10/29/2019  CT angiogram abdomen and pelvis 10/11/2019  Upper GI series 10/19/2019, 10/28/2019, 11/03/2019  2D echo 10/13/2019, 10/16/2019  Right upper extremity Doppler 11/02/2019  Left upper extremity Dopplers  10/17/2019  CT-guided drain placement right upper quadrant fluid collection per interventional radiology, Dr. Earleen Newport 10/22/2019  Exploratory laparotomy, omental patch repair of duodenal ulcer per Dr. [name] 10/12/2019  Right IJ CVL 10/12/2019>>>> 10/13/2019  Right radial A-line 10/12/2019>>>> 10/15/2019  ETT 10/12/2019>>>> 10/15/2019  Left upper extremity PICC 10/14/2019  Fistulogram 11/06/2019, 10/30/2019  Significant Hospital Events   6/14 Admit, exploratory laparotomy, omental patch repair of duodenal ulcer 6/16 Off vasopressors, less drainage from abd drain / clearing    6/17 extubated, weak cough, added high flow oxygen 6/18 start heparin gtt 6/20 d/c amiodarone 6/23 new fluid collection on CT abd > Drained by IR 6/24 To [facility] 6/25 PCCM sign off 7/01 back to ICU with fever, tachypnea >> PCCM asked to reassess 7/2comfortable this morning     Significant Diagnostic Tests:   CT angio abd/pelvis 6/13 >> mild atherosclerosis, no significant arterial stenosis of mesenteric vasculature, no findings of bowel ischemia, colonic diverticulosis w/o diverticulitis, b/l renal cysts, 12 mm lesion upper pole Rt kidney  ECHO 6/15 >> poor image quality, appears to be regional wall motion variability but confident wall motion analysis is impossible even after contrast administration, LVEF ~ 81-19%, Grade I diastolic dysfunction, RV systolic function is moderately reduced, RV is mildly enlarged  6/19 venous doppler LUE>>acute deep vein thrombosis involving the left axillary vein, surrounding the PICC line. Findings consistent with acute superficial vein thrombosis involving the left cephalic vein.  UGI 6/21 >> small leak from duodenum  CT ABD/Pelvis 6/23 >>interval midline abdominal incision with incomplete skin surgical closure, probable small hematomas within the anterior abdominal wall and the omentum. Nonspecific 4.3 cm air-fluid collection in the RUQ of the abdomen between the duodenal bulb and gallbladder, not containing enteric contrast. No residual leak from the duodenal bulb identified. New small bilateral pleural effusions with dependent L>R lower lobe opacities  Upper GI series 6/30 >>Small puckered stump of a leak at the site of the prior elongatedeak. Findings suggest resolving but persistent extraluminalcollection.  CT abd/pelvis 7/01 >>Anterior midline surgical incision is again noted. Large irregular high density is noted in this area most consistent with enlarging subcutaneous or intramuscular hematoma. Interval placement of pigtail drainage  catheter in the area of fluid collection noted on prior exam in the right upper quadrant. Fluid collection is significantly decompressed compared to prior exam. Micro Data:  SARS CoV2 6/13 >> negative RUQ fluid collection 6/24 >> moderate Candida albicans Blood 6/25 >>negative Blood culture 10/29/2019>>> negative     Antimicrobials:   IV Merrem 10/29/2019>>>>>> 11/05/2019  IV Zosyn 10/12/2019>>>> 10/29/2019  IV vancomycin 10/29/2019>>>> 11/03/2019  IV fluconazole 10/25/2019>>>>   Subjective: Patient sitting upright in bed.  Patient denies any chest pain or shortness of breath.  States abdominal discomfort improved.  Stool output decreased.  Patient stated had some flatus this morning with some improvement with abdominal discomfort.  Objective: Vitals:   11/07/19 0400 11/07/19 0752 11/07/19 0800 11/07/19 0825  BP: (!) 131/59  125/65 (!) 150/70  Pulse: 67  65 75  Resp: (!) 22  (!) 26 (!) 25  Temp:  97.8 F (36.6 C)    TempSrc:  Oral    SpO2: 97%  97% 96%  Weight:      Height:        Intake/Output Summary (Last 24 hours) at 11/07/2019 0935 Last data filed at 11/07/2019 0800 Gross per 24 hour  Intake 2006.16 ml  Output 650 ml  Net 1356.16 ml   Filed Weights   11/04/19 0500 11/05/19  0500 11/06/19 0500  Weight: 91 kg 92.9 kg 93.4 kg    Examination:  General exam: NAD.  Dry mucous membranes. Respiratory system: Lungs clear to auscultation bilaterally anterior lung fields. Cardiovascular system: Regular rate rhythm no murmurs rubs or gallops.  No JVD.  No lower extremity edema.  Gastrointestinal system: Abdomen is distended, soft, positive bowel sounds, bandage on midline wound, right upper quadrant drain noted with no drainage/discharge.  No guarding.  No rebound.  Decreasing diffuse tenderness to palpation.  Central nervous system: Alert and oriented. No focal neurological deficits. Extremities: Symmetric 5 x 5 power. Skin: No rashes, lesions or ulcers Psychiatry: Judgement and  insight appear normal. Mood & affect appropriate.     Data Reviewed: I have personally reviewed following labs and imaging studies  CBC: Recent Labs  Lab 11/01/19 0500 11/01/19 0500 11/02/19 0540 11/02/19 0540 11/03/19 0430 11/04/19 0609 11/05/19 0618 11/06/19 0536 11/07/19 0505  WBC 10.3   < > 13.6*   < > 9.9 9.9 9.7 9.8 9.3  NEUTROABS 7.6  --  10.3*  --   --   --   --   --   --   HGB 8.7*   < > 9.1*   < > 8.0* 8.5* 8.4* 8.4* 8.4*  HCT 27.4*   < > 28.7*   < > 25.8* 27.4* 26.8* 26.5* 27.1*  MCV 91.3   < > 84.7   < > 96.3 85.1 85.4 84.9 85.2  PLT 351   < > 367   < > 284 373 366 368 383   < > = values in this interval not displayed.    Basic Metabolic Panel: Recent Labs  Lab 11/03/19 0521 11/04/19 0609 11/05/19 0618 11/06/19 0536 11/07/19 0505  NA 136 137 138 137 132*  K 4.1 4.4 3.7 4.3 4.3  CL 105 106 106 105 103  CO2 23 24 24 24 25   GLUCOSE 135* 181* 95 96 86  BUN 29* 31* 29* 28* 27*  CREATININE 0.68 0.80 0.69 0.63 0.70  CALCIUM 7.6* 7.6* 7.9* 8.0* 8.0*  MG 2.3 2.5* 2.1 2.1 2.2  PHOS 2.7 3.2 2.7 2.6 3.1    GFR: Estimated Creatinine Clearance: 80.2 mL/min (by C-G formula based on SCr of 0.7 mg/dL).  Liver Function Tests: Recent Labs  Lab 11/01/19 0645 11/02/19 0540 11/03/19 0521 11/05/19 0618  AST 27 49* 48* 64*  ALT 26 44 48* 68*  ALKPHOS 64 86 82 72  BILITOT 0.7 0.6 0.7 0.3  PROT 6.9 7.1 6.2* 6.4*  ALBUMIN 1.6* 1.5* 1.4* 1.6*    CBG: Recent Labs  Lab 11/06/19 0546 11/06/19 1155 11/06/19 1741 11/06/19 2333 11/07/19 0550  GLUCAP 94 93 92 90 83     Recent Results (from the past 240 hour(s))  Culture, blood (single)     Status: None   Collection Time: 10/29/19  7:06 PM   Specimen: BLOOD  Result Value Ref Range Status   Specimen Description   Final    BLOOD RIGHT ANTECUBITAL Performed at [facility], [place] [address], [place], [place] [address]    Special Requests   Final    BOTTLES DRAWN AEROBIC ONLY Blood Culture  results may not be optimal due to an inadequate volume of blood received in culture bottles Performed at [facility] [address], [place], [place] [address]    Culture   Final    NO GROWTH 5 DAYS Performed at [facility], [place] [address]  [place], [place] [address]    Report Status 11/03/2019 FINAL  Final  C Difficile Quick Screen (NO PCR Reflex)     Status: None   Collection Time: 11/05/19 12:17 PM   Specimen: STOOL  Result Value Ref Range Status   C Diff antigen NEGATIVE NEGATIVE Final   C Diff toxin NEGATIVE NEGATIVE Final   C Diff interpretation NEGATIVE  Final    Comment: Performed at [facility], [place] [address] [place], [place] [address]         Radiology Studies: DG Abd 1 View  Result Date: 11/06/2019 CLINICAL DATA:  Acute abdominal pain and distention. EXAM: ABDOMEN - 1 VIEW COMPARISON:  None. FINDINGS: The bowel gas pattern is normal. Pigtail drainage catheter is noted in right upper quadrant. No radio-opaque calculi or other significant radiographic abnormality are seen. IMPRESSION: No evidence of bowel obstruction or ileus. Electronically Signed   By: Marijo Conception M.D.   On: 11/06/2019 12:00   DG Sinus/Fist Tube Chk-Non GI  Result Date: 11/06/2019 INDICATION: Status post drainage of abdominal abscess. EXAM: Injection of drainage catheter in right upper quadrant. MEDICATIONS: The patient is currently admitted to the hospital and receiving intravenous antibiotics. The antibiotics were administered within an appropriate time frame prior to the initiation of the procedure. ANESTHESIA/SEDATION: None. COMPLICATIONS: None immediate. PROCEDURE: Under fluoroscopy, contrast was injected through pre-existing catheter in the right upper quadrant. This demonstrates filling of fistula extending into proximal duodenum with filling of the duodenum indicating patency. IMPRESSION: Upon injection of pigtail drainage catheter in right upper  quadrant, filling of proximal duodenum is noted consistent with patent fistula or connection with drained abscess. Electronically Signed   By: Marijo Conception M.D.   On: 11/06/2019 15:58        Scheduled Meds: . chlorhexidine  15 mL Mouth Rinse BID  . Chlorhexidine Gluconate Cloth  6 each Topical Daily  . cloNIDine  0.2 mg Transdermal Weekly  . insulin aspart  0-20 Units Subcutaneous Q6H  . insulin detemir  10 Units Subcutaneous BID  . lidocaine  2 patch Transdermal Q24H  . mouth rinse  15 mL Mouth Rinse q12n4p  . metoprolol tartrate  10 mg Intravenous Q4H  . nitroGLYCERIN  1 inch Topical Q6H  . pantoprazole (PROTONIX) IV  40 mg Intravenous Q12H  . simethicone  160 mg Oral QID  . sodium chloride flush  10-40 mL Intracatheter Q12H  . sodium chloride flush  5 mL Intracatheter Q8H  . sucralfate  1 g Oral BID   Continuous Infusions: . sodium chloride Stopped (11/05/19 1544)  . sodium chloride 10 mL/hr at 11/07/19 0800  . fluconazole (DIFLUCAN) IV Stopped (11/06/19 2040)  . heparin 1,200 Units/hr (11/07/19 0800)  . TPN ADULT (ION) 95 mL/hr at 11/07/19 0800  . TPN ADULT (ION)       LOS: 26 days    Time spent: 40 minutes    Irine Seal, MD [facility]   To contact the attending provider between 7A-7P or the covering provider during after hours 7P-7A, please log into the web site [email or web address] and access using universal [place] password for that web site. If you do not have the password, please call the hospital operator.  11/07/2019, 9:35 AM

## 2019-11-07 NOTE — Progress Notes (Signed)
PHARMACY - TOTAL PARENTERAL NUTRITION CONSULT NOTE   Indication: Prolonged ileus  Patient Measurements: Height: '5\' 11"'  (180.3 cm) Weight: 93.4 kg (205 lb 14.6 oz) IBW/kg (Calculated) : 75.3 TPN AdjBW (KG): 93.6 Body mass index is 28.72 kg/m. Usual Weight: 90.7 kg   Assessment:  Pharmacy is consulted to start TPN in 85 yo male with prolonged ileus. Pt underwent exploratory laparotomy on 6/14 which revealed perforated duodenal ulcer.   Glucose / Insulin: hx of DM - on metformin PTA.  - CBGs < 150 with 60 units/bag insulin in TPN, rSSI q6h (range 90-95; goal 100-150) - 0 units of SS insulin required yesterday - Levemir 10 units BID per CCM started 6/18  Electrolytes: K (target >/= 4.0) is 4.3 . Magnesium 2.2 (target >/= 2.0), CCa 9.9; all lytes WNL  Renal: SCr has been WNL, BUN 27 slightly elevated but stable. 650 mL UOP last 24hr charted LFTs / TGs: AST/ALT 64/68 slightly elevated; T.bili, alk phos WNL. TG (65) remain WNL off of propofol. Prealbumin / albumin: prealbumin 8.3 (7/5) 9.6 (6/28), 9.0 (6/21); albumin 1.6 (7/8) Intake / Output; MIVF: NS at [facility]; NG removed; GI Imaging:  - 6/21 Upper GI series: small leak from superior margin of duodenal bulb. - 6/23 CT abdomen: 4.3 cm air-fluid collection in the right upper quadrant of the abdomen between the duodenal bulb and gallbladder,not containing enteric contrast. No residual leak from the duodenal bulb identified. - 7/1 Abdominal CT: large irregular high density noted most consistent with enlarging subcutaneous or IM hematoma - 7/6 Upper GI showed no leak.  Will need repeat sinogram without fistula to start oral feeds to maximize chance of this healing.   Surgeries / Procedures:  - 6/14 Exploratory laparotomy which revealed perforated duodenal ulcer - 6/24 RUQ drain placed > 10 ml > cultured - 6/25 removal R abd JP drain   Central access:  PICC Line 6/15 TPN start date: 6/15  Nutritional Goals: per RD recommendation on  6/30: Kcal:  2200-2400 Protein:  115-125 grams Fluid:  >2L/d  Goal TPN rate is 95 mL/hr (provides 121 g of protein and 2262 kcals per day)  Current Nutrition:  NPO   Plan: at 18:00 tonight  Continue TPN at 95 mL/hr at 1800, goal rate   Electrolytes in TPN:   85 mEq/L of Na, 85 mEq/L of K , 4 mEq/L of Ca, 12 mEq/L of Magnesium, and 15 mmol/L of Phos. Cl:Ac ratio max Ac  Standard MVI and trace elements to TPN  Continue resistant q6h SSI, levemir and adjust as needed   Decrease to 50 units of regular insulin/day in TPN as some decrease in CBGs, Aiming to prevent hypoglycemia   Continue MIVF at [facility] mL/hr   Magnesium, phosphorus, BMP with AM labs   Monitor TPN labs on Mon/Thurs   Royetta Asal, PharmD, BCPS 11/07/2019 7:12 AM

## 2019-11-07 NOTE — Progress Notes (Signed)
Physical Therapy Treatment Patient Details Name: Matthew Schwartz MRN: [id number] DOB: 10-08-1932 Today's Date: 11/07/2019    History of Present Illness Pt s/p exploratory lap (10/12/19) to repair perforated duodenal ulcer and then on vent with extubation 10/14/19.  Pt with acute respiratory failure with hypoxia and acute systolic CHF in setting of sepsis and peritonitis.  Pt with hx of DM, prostate CA and bil TKR    PT Comments    Pt initially reluctant to mobilize, but with encouragement agreed to sit edge of bed. Max assist of 2 for rolling in bed, +2 total assist for sidelying to sit. Pt sat at edge of bed x 20 minutes, requiring mod A for balance for L lateral lean initially, then progressed to min/guard with BUE/LEs supported. He performed reaching activities as well and trunk/neck rotation AAROM while sitting. Pt reported dizziness in sitting but was not orthostatic (BP 150/70 sitting). Bed left in chair position at end of session. Encouraged pt's daughter to speak to him from the R as he has limited cervical rotation AROM to the R.  Increased activity tolerance today.   Follow Up Recommendations  SNF     Equipment Recommendations  None recommended by PT    Recommendations for Other Services       Precautions / Restrictions Precautions Precaution Comments: 1 right drain, abd, NG tube, may have loose BM Restrictions Weight Bearing Restrictions: No    Mobility  Bed Mobility Overal bed mobility: Needs Assistance Bed Mobility: Rolling;Sidelying to Sit Rolling: Max assist;+2 for safety/equipment Sidelying to sit: Total assist;+2 for physical assistance;+2 for safety/equipment     Sit to sidelying: +2 for physical assistance;Total assist General bed mobility comments: rolling L x 2 and R times 1 for linen change. Verbal/manual cues for log roll technique. Pt sat edge of bed x 20 minutes, see balance section for more details  Transfers                    Ambulation/Gait                  Stairs             Wheelchair Mobility    Modified Rankin (Stroke Patients Only)       Balance Overall balance assessment: Needs assistance Sitting-balance support: Bilateral upper extremity supported;Feet supported Sitting balance-Leahy Scale: Poor Sitting balance - Comments: leans left, initially fearful of falling and reported dizziness but not orthostatic (BP sitting 150/70), Mod A for balance due to L lateral lean then progressed to min/guard,  initailly had difficulty rotating head to right past midline but with several repetitions of R neck rotation AAROM pt was able to rotate past midline, able to bring trunk to midline with verbal and manual cues to weight shift to Right, BUEs/LEs supported, sat EOB x 20 min, performed forward reaching with increased time and encouragement, trunk rotation AAROM to R and L x 1 each, bed left in chair position                                    Cognition Arousal/Alertness: Awake/alert Behavior During Therapy: Flat affect Overall Cognitive Status: Within Functional Limits for tasks assessed                                 General Comments: patient more interactive and conversive  once in sitting, he was lethargic in supine      Exercises      General Comments        Pertinent Vitals/Pain Pain Assessment: No/denies pain    Home Living                      Prior Function            PT Goals (current goals can now be found in the care plan section) Acute Rehab PT Goals Patient Stated Goal: get stronger PT Goal Formulation: With patient/family Time For Goal Achievement: 11/18/19 Potential to Achieve Goals: Fair Progress towards PT goals: Progressing toward goals    Frequency    Min 2X/week      PT Plan Current plan remains appropriate    Co-evaluation PT/OT/SLP Co-Evaluation/Treatment: Yes            AM-PAC PT "6 Clicks" Mobility   Outcome Measure   Help needed turning from your back to your side while in a flat bed without using bedrails?: Total Help needed moving from lying on your back to sitting on the side of a flat bed without using bedrails?: Total Help needed moving to and from a bed to a chair (including a wheelchair)?: Total Help needed standing up from a chair using your arms (e.g., wheelchair or bedside chair)?: Total Help needed to walk in hospital room?: Total Help needed climbing 3-5 steps with a railing? : Total 6 Click Score: 6    End of Session   Activity Tolerance: Patient limited by fatigue;Patient tolerated treatment well Patient left: in bed;with call bell/phone within reach;with nursing/sitter in room;with family/visitor present Nurse Communication: Mobility status;Need for lift equipment PT Visit Diagnosis: Difficulty in walking, not elsewhere classified (R26.2);Muscle weakness (generalized) (M62.81)     Time: 8372-9021 PT Time Calculation (min) (ACUTE ONLY): 37 min  Charges:  $Therapeutic Activity: 8-22 mins                     Blondell Reveal Kistler PT 11/07/2019  Acute Rehabilitation Services Pager [phone] Office [phone]

## 2019-11-07 NOTE — Progress Notes (Signed)
Daily Progress Note   Patient Name: Matthew Schwartz       Date: 11/07/2019 DOB: 1932/05/21  Age: 86 y.o. MRN#: [id number] Attending Physician: Eugenie Filler, MD Primary Care Physician: [name], MD Admit Date: 10/12/2019  Reason for Consultation/Follow-up: Establishing goals of care  Subjective: I saw and examined Matthew Schwartz today and spoke with his daughter at the bedside.     Much more awake alert this am, participated well with PT OT earlier this am, patient states he is feeling hungry. He is now on ice chips. We reviewed about his current overall condition and next steps.   Length of Stay: 26  Current Medications: Scheduled Meds:   chlorhexidine  15 mL Mouth Rinse BID   Chlorhexidine Gluconate Cloth  6 each Topical Daily   cloNIDine  0.2 mg Transdermal Weekly   insulin aspart  0-20 Units Subcutaneous Q6H   insulin detemir  10 Units Subcutaneous BID   lidocaine  2 patch Transdermal Q24H   mouth rinse  15 mL Mouth Rinse q12n4p   metoprolol tartrate  10 mg Intravenous Q4H   nitroGLYCERIN  1 inch Topical Q6H   pantoprazole (PROTONIX) IV  40 mg Intravenous Q12H   simethicone  160 mg Oral QID   sodium chloride flush  10-40 mL Intracatheter Q12H   sodium chloride flush  5 mL Intracatheter Q8H   sucralfate  1 g Oral BID    Continuous Infusions:  sodium chloride Stopped (11/05/19 1544)   sodium chloride 10 mL/hr at 11/07/19 0800   fluconazole (DIFLUCAN) IV Stopped (11/06/19 2040)   heparin 1,200 Units/hr (11/07/19 0800)   TPN ADULT (ION) 95 mL/hr at 11/07/19 0800   TPN ADULT (ION)      PRN Meds: sodium chloride, acetaminophen, acetaminophen, bisacodyl, fentaNYL (SUBLIMAZE) injection, hydrALAZINE, ipratropium-albuterol, lip balm, LORazepam, melatonin,  ondansetron (ZOFRAN) IV, silver nitrate applicators, sodium chloride flush  Physical Exam         Elderly male sitting up in in bed Has some dependent edema, appears to be improving.  Regular work of breathing S1-S2 Abdomen not distended  Vital Signs: BP (!) 150/70 (BP Location: Right Arm)    Pulse 75    Temp 97.8 F (36.6 C) (Oral)    Resp (!) 25    Ht 5\' 11"  (1.803 m)  Wt 93.4 kg    SpO2 96%    BMI 28.72 kg/m  SpO2: SpO2: 96 % O2 Device: O2 Device: Room Air O2 Flow Rate: O2 Flow Rate (L/min): 2 L/min  Intake/output summary:   Intake/Output Summary (Last 24 hours) at 11/07/2019 1101 Last data filed at 11/07/2019 0800 Gross per 24 hour  Intake 2006.16 ml  Output 650 ml  Net 1356.16 ml   LBM: Last BM Date: 11/07/19 Baseline Weight: Weight: 96.4 kg Most recent weight: Weight: 93.4 kg       Palliative Assessment/Data:    Flowsheet Rows     Most Recent Value  Intake Tab  Referral Department Hospitalist  Unit at Time of Referral ICU  Date Notified 10/23/19  Palliative Care Type New Palliative care  Reason for referral Clarify Goals of Care  Date of Admission 10/12/19  Date first seen by Palliative Care 10/24/19  # of days Palliative referral response time 1 Day(s)  # of days IP prior to Palliative referral 11  Clinical Assessment  Psychosocial & Spiritual Assessment  Palliative Care Outcomes     Palliative performance scale 30% Patient Active Problem List   Diagnosis Date Noted   Gastric ulcer    Hypokalemia    NSVT (nonsustained ventricular tachycardia) (HCC)    Diarrhea    Severe sepsis with septic shock (HCC)    NSTEMI (non-ST elevated myocardial infarction) (HCC)    Acute deep vein thrombosis (DVT) of left upper extremity (HCC)    Acute systolic CHF (congestive heart failure) (HCC)    Peritonitis (HCC)    Epigastric pain    Palliative care by specialist    Goals of care, counseling/discussion    General weakness    Acute abdominal pain      Bilateral atelectasis 10/17/2019   Acute respiratory insufficiency    Elevated troponin level    Acute respiratory failure with hypoxemia (HCC)    PVCs (premature ventricular contractions)    Obstipation 10/12/2019   HLD (hyperlipidemia) 10/12/2019   Diabetes mellitus type 2, uncontrolled, with complications ([place]) 89/21/1941   Perforated bowel ([place]) 10/12/2019   Perforated duodenal ulcer ([place]) 10/12/2019   Anemia 09/07/2016   Coronary artery disease due to lipid rich plaque 02/16/2015   Hyperlipidemia 02/16/2015   Malignant neoplasm of prostate ([place]) 02/18/2014   HTN (hypertension) 06/14/2013   Dyslipidemia 06/14/2013   GERD (gastroesophageal reflux disease) 06/14/2013    Palliative Care Assessment & Plan   Patient Profile:    Assessment: 86 year old gentleman with coronary artery disease status post percutaneous intervention in the year 2000, hypertension, dyslipidemia, diabetes who was admitted with perforated duodenal ulcer and peritonitis. Cardiology has been following for NSVT, intermittent left bundle branch block and anterolateral T wave inversions as well as elevated troponin, at some point is supposed to undergo cardiac catheterization after acute issues resolved. General surgery also following.  Has NG tube.  To continue TPN.  Plan for another upper GI this week.  Recommendations/Plan:  Full code/Full scope care  Continue current pain and non pain symptom management.   TOC following for disposition discussions.    PMT will follow periodically, throughout the course of this hospitalization.    Goals of Care and Additional Recommendations:  Limitations on Scope of Treatment: Full Scope Treatment  Code Status:    Code Status Orders  (From admission, onward)         Start     Ordered   10/12/19 1208  Full code  Continuous  10/12/19 1212        Code Status History    Date Active Date Inactive Code Status Order ID Comments User  Context   02/12/2014 1127 02/13/2014 0336 Full Code [id number]  Arne Cleveland, MD [place] Planning Activity    Advance Directive Documentation     Most Recent Value  Type of Advance Directive Healthcare Power of Attorney, Living will  Pre-existing out of facility DNR order (yellow form or pink MOST form) --  "MOST" Form in Place? --       Prognosis:   Unable to determine Guarded, continue to monitor hospital course and overall disease trajectory.  Discharge Planning:  To Be Determined Recommend:  LTACH, or SNF rehab with palliative  Care plan was discussed with patient and  daughter at bedside   Thank you for allowing the Palliative Medicine Team to assist in the care of this patient.   Total Time 25 Prolonged Time Billed  no    Greater than 50%  of this time was spent counseling and coordinating care related to the above assessment and plan.  Loistine Chance, MD  Please contact Palliative Medicine Team phone at [phone] for questions and concerns.

## 2019-11-07 NOTE — Progress Notes (Signed)
Occupational Therapy Treatment Patient Details Name: Matthew Schwartz MRN: [id number] DOB: [birth year] Today's Date: 11/07/2019    History of present illness Pt s/p exploratory lap (10/12/19) to repair perforated duodenal ulcer and then on vent with extubation 10/14/19.  Pt with acute respiratory failure with hypoxia and acute systolic CHF in setting of sepsis and peritonitis.  Pt with hx of DM, prostate CA and bil TKR   OT comments  With encouragement, pt performed bed mobility and progressed to sitting EOB x 20 minutes with +2 max assist initially and progressed to min guard assist +2 for safety. Worked on head turn to R, weight shifting onto R hip in sitting with verbal and visual cues and with reaching activities. Pt with stable VS throughout session. Pt in chair position, able to bring loaded spoon to mouth for ice chips. Pt and daughter pleased with pt's progress this visit.   Follow Up Recommendations  SNF;Supervision/Assistance - 24 hour    Equipment Recommendations  Other (comment) (defer to next venue)    Recommendations for Other Services      Precautions / Restrictions Precautions Precautions: Fall Precaution Comments: 1 right drain--abd, may have loose BM Restrictions Weight Bearing Restrictions: No       Mobility Bed Mobility Overal bed mobility: Needs Assistance Bed Mobility: Rolling;Sidelying to Sit;Sit to Supine Rolling: Max assist;+2 for safety/equipment Sidelying to sit: Total assist;+2 for physical assistance;+2 for safety/equipment   Sit to supine: Total assist;+2 for physical assistance;+2 for safety/equipment Sit to sidelying: +2 for physical assistance;Total assist General bed mobility comments: rolling L x 2 and R times 1 for linen change. Verbal/manual cues for log roll technique. Pt sat edge of bed x 20 minutes, see balance section for more details  Transfers                      Balance Overall balance assessment: Needs assistance Sitting-balance  support: Bilateral upper extremity supported;Feet supported Sitting balance-Leahy Scale: Poor                                    ADL either performed or assessed with clinical judgement   ADL Overall ADL's : Needs assistance/impaired Eating/Feeding: Maximal assistance;Bed level (bed in chair position) Eating/Feeding Details (indicate cue type and reason): ice chips                 Lower Body Dressing: Total assistance;Bed level                       Vision       Perception     Praxis      Cognition Arousal/Alertness: Awake/alert Behavior During Therapy: Anxious Overall Cognitive Status: Within Functional Limits for tasks assessed                                 General Comments: patient more interactive and conversive once in sitting, he was lethargic in supine, slow processing        Exercises     Shoulder Instructions       General Comments      Pertinent Vitals/ Pain       Pain Assessment: Faces Faces Pain Scale: No hurt  Home Living  Prior Functioning/Environment              Frequency  Min 2X/week        Progress Toward Goals  OT Goals(current goals can now be found in the care plan section)  Progress towards OT goals: Progressing toward goals  Acute Rehab OT Goals Patient Stated Goal: get stronger OT Goal Formulation: With patient/family Time For Goal Achievement: 11/12/19 Potential to Achieve Goals: [place] Discharge plan needs to be updated    Co-evaluation    PT/OT/SLP Co-Evaluation/Treatment: Yes Reason for Co-Treatment: Complexity of the patient's impairments (multi-system involvement);For patient/therapist safety   OT goals addressed during session: Strengthening/ROM      AM-PAC OT "6 Clicks" Daily Activity     Outcome Measure   Help from another person eating meals?: A Lot Help from another person taking care of  personal grooming?: Total Help from another person toileting, which includes using toliet, bedpan, or urinal?: Total Help from another person bathing (including washing, rinsing, drying)?: Total Help from another person to put on and taking off regular upper body clothing?: Total Help from another person to put on and taking off regular lower body clothing?: Total 6 Click Score: 7    End of Session    OT Visit Diagnosis: Muscle weakness (generalized) (M62.81);Pain   Activity Tolerance Patient tolerated treatment well   Patient Left in bed;with call bell/phone within reach;with family/visitor present (bed in chair position)   Nurse Communication Mobility status        Time: 7737-3668 OT Time Calculation (min): 36 min  Charges: OT General Charges $OT Visit: 1 Visit OT Treatments $Therapeutic Activity: 8-22 mins  Nestor Lewandowsky, OTR/L Acute Rehabilitation Services Pager: [phone] Office: [phone]   Malka So 11/07/2019, 10:38 AM

## 2019-11-07 NOTE — Progress Notes (Signed)
26 Days Post-Op   Subjective/Chief Complaint: Pt with no acute changes IR tube study reviewed and discussed with pt and daughter at the [facility]   Objective: Vital signs in last 24 hours: Temp:  [97.7 F (36.5 C)-99.2 F (37.3 C)] 97.8 F (36.6 C) (07/10 0752) Pulse Rate:  [65-79] 75 (07/10 0825) Resp:  [17-26] 25 (07/10 0825) BP: (125-163)/(59-80) 150/70 (07/10 0825) SpO2:  [94 %-100 %] 96 % (07/10 0825) Last BM Date: 11/07/19  Intake/Output from previous day: 07/09 0701 - 07/10 0700 In: 1889.2 [I.V.:1689.3; IV Piggyback:199.9] Out: 650 [Urine:650] Intake/Output this shift: Total I/O In: 117 [I.V.:117] Out: -   General appearance: alert and cooperative GI: soft, non-tender; bowel sounds normal; no masses,  no organomegaly and midline wound c/d/i great granulation tissue  Lab Results:  Recent Labs    11/06/19 0536 11/07/19 0505  WBC 9.8 9.3  HGB 8.4* 8.4*  HCT 26.5* 27.1*  PLT 368 383   BMET Recent Labs    11/06/19 0536 11/07/19 0505  NA 137 132*  K 4.3 4.3  CL 105 103  CO2 24 25  GLUCOSE 96 86  BUN 28* 27*  CREATININE 0.63 0.70  CALCIUM 8.0* 8.0*   PT/INR No results for input(s): LABPROT, INR in the last 72 hours. ABG No results for input(s): PHART, HCO3 in the last 72 hours.  Invalid input(s): PCO2, PO2  Studies/Results: DG Abd 1 View  Result Date: 11/06/2019 CLINICAL DATA:  Acute abdominal pain and distention. EXAM: ABDOMEN - 1 VIEW COMPARISON:  None. FINDINGS: The bowel gas pattern is normal. Pigtail drainage catheter is noted in right upper quadrant. No radio-opaque calculi or other significant radiographic abnormality are seen. IMPRESSION: No evidence of bowel obstruction or ileus. Electronically Signed   By: Marijo Conception M.D.   On: 11/06/2019 12:00   DG Sinus/Fist Tube Chk-Non GI  Result Date: 11/06/2019 INDICATION: Status post drainage of abdominal abscess. EXAM: Injection of drainage catheter in right upper quadrant. MEDICATIONS: The patient is  currently admitted to the hospital and receiving intravenous antibiotics. The antibiotics were administered within an appropriate time frame prior to the initiation of the procedure. ANESTHESIA/SEDATION: None. COMPLICATIONS: None immediate. PROCEDURE: Under fluoroscopy, contrast was injected through pre-existing catheter in the right upper quadrant. This demonstrates filling of fistula extending into proximal duodenum with filling of the duodenum indicating patency. IMPRESSION: Upon injection of pigtail drainage catheter in right upper quadrant, filling of proximal duodenum is noted consistent with patent fistula or connection with drained abscess. Electronically Signed   By: Marijo Conception M.D.   On: 11/06/2019 15:58    Anti-infectives: Anti-infectives (From admission, onward)   Start     Dose/Rate Route Frequency Ordered Stop   11/06/19 1800  fluconazole (DIFLUCAN) IVPB 400 mg     Discontinue     400 mg 100 mL/hr over 120 Minutes Intravenous Every 24 hours 11/05/19 1850     10/31/19 2200  vancomycin (VANCOREADY) IVPB 1250 mg/250 mL  Status:  Discontinued        1,250 mg 166.7 mL/hr over 90 Minutes Intravenous Every 12 hours 10/31/19 1602 11/03/19 1152   10/29/19 2200  meropenem (MERREM) 1 g in sodium chloride 0.9 % 100 mL IVPB        1 g 200 mL/hr over 30 Minutes Intravenous Every 8 hours 10/29/19 2008 11/05/19 2207   10/29/19 2200  vancomycin (VANCOCIN) IVPB 1000 mg/200 mL premix  Status:  Discontinued        1,000 mg  200 mL/hr over 60 Minutes Intravenous Every 12 hours 10/29/19 2008 10/31/19 1602   10/25/19 1800  fluconazole (DIFLUCAN) IVPB 400 mg  Status:  Discontinued        400 mg 100 mL/hr over 120 Minutes Intravenous Every 24 hours 10/24/19 1750 11/05/19 1850   10/24/19 1830  fluconazole (DIFLUCAN) IVPB 800 mg  Status:  Discontinued        800 mg 200 mL/hr over 120 Minutes Intravenous  Once 10/24/19 1750 10/24/19 1755   10/24/19 1830  fluconazole (DIFLUCAN) IVPB 800 mg        800  mg 100 mL/hr over 240 Minutes Intravenous Every 24 hours 10/24/19 1756 10/24/19 2240   10/12/19 2000  piperacillin-tazobactam (ZOSYN) IVPB 3.375 g  Status:  Discontinued        3.375 g 12.5 mL/hr over 240 Minutes Intravenous Every 8 hours 10/12/19 1911 10/29/19 1945   10/12/19 1708  ceFAZolin (ANCEF) 2-4 GM/100ML-% IVPB       Note to Pharmacy: Bennye Alm   : cabinet override      10/12/19 1708 10/12/19 1849      Assessment/Plan: NSTEMI/NSVT-Heparin drip/nitro ointment/beta-blocker Acute systolic HF - EF 53-20%/EBXID 1 diastolic function DVT - LUE 10/17/19 Remote hx of prostate cancer s/p radiation HTN- off pressors HLD GERD- Protonix DM2 VDRF-extubated 6/17 PCCM sign off 6/25 VT-on amio drip/DCed 6/20 Hyperglycemia - SSI Hypokalemia  ABL anemia stable at 8.4 SevereProtein calorie malnutrition on TNA Anemia   Perforated Duodenal Ulcer S/p Exploratory laparotomy, omental patch repair of duodenal ulcer - Dr. [name] - 6/14/2021POD#19 Postop leak Postop IR fistula tract to the duodenal bulb -fistulagram shows very small fistula.  Con't NPO and drain for now. Rec repeat fistulagram next week to assess closure. -will need swallow eval prior to PO. -mobilize with PT/OT -IS/pulm toilet -BID WD dressing changes to midline wound  FEN -NPO, IVF, TPN VTE - SCDs, heparindrip ID -[id number]- 10/29/19;Diflucan 6/26>>day10; Meropenem 7/1>> 7/8, completed Foley - condom cath type device Follow-Up - Dr. [name]   LOS: 26 days    Ralene Ok 11/07/2019

## 2019-11-08 ENCOUNTER — Inpatient Hospital Stay (HOSPITAL_COMMUNITY): Payer: Medicare Other

## 2019-11-08 DIAGNOSIS — R52 Pain, unspecified: Secondary | ICD-10-CM

## 2019-11-08 DIAGNOSIS — D638 Anemia in other chronic diseases classified elsewhere: Secondary | ICD-10-CM

## 2019-11-08 DIAGNOSIS — I959 Hypotension, unspecified: Secondary | ICD-10-CM

## 2019-11-08 LAB — CBC WITH DIFFERENTIAL/PLATELET
Abs Immature Granulocytes: 0.17 10*3/uL — ABNORMAL HIGH (ref 0.00–0.07)
Basophils Absolute: 0 10*3/uL (ref 0.0–0.1)
Basophils Relative: 0 %
Eosinophils Absolute: 0.1 10*3/uL (ref 0.0–0.5)
Eosinophils Relative: 1 %
HCT: 18.5 % — ABNORMAL LOW (ref 39.0–52.0)
Hemoglobin: 5.9 g/dL — CL (ref 13.0–17.0)
Immature Granulocytes: 2 %
Lymphocytes Relative: 10 %
Lymphs Abs: 1.2 10*3/uL (ref 0.7–4.0)
MCH: 26.8 pg (ref 26.0–34.0)
MCHC: 31.9 g/dL (ref 30.0–36.0)
MCV: 84.1 fL (ref 80.0–100.0)
Monocytes Absolute: 0.7 10*3/uL (ref 0.1–1.0)
Monocytes Relative: 7 %
Neutro Abs: 9 10*3/uL — ABNORMAL HIGH (ref 1.7–7.7)
Neutrophils Relative %: 80 %
Platelets: 313 10*3/uL (ref 150–400)
RBC: 2.2 MIL/uL — ABNORMAL LOW (ref 4.22–5.81)
RDW: 16.5 % — ABNORMAL HIGH (ref 11.5–15.5)
WBC: 11.3 10*3/uL — ABNORMAL HIGH (ref 4.0–10.5)
nRBC: 0 % (ref 0.0–0.2)

## 2019-11-08 LAB — CBC
HCT: 25.1 % — ABNORMAL LOW (ref 39.0–52.0)
HCT: 20.3 % — ABNORMAL LOW (ref 39.0–52.0)
Hemoglobin: 7.9 g/dL — ABNORMAL LOW (ref 13.0–17.0)
Hemoglobin: 6.8 g/dL — CL (ref 13.0–17.0)
MCH: 26.2 pg (ref 26.0–34.0)
MCH: 28.7 pg (ref 26.0–34.0)
MCHC: 31.5 g/dL (ref 30.0–36.0)
MCHC: 33.5 g/dL (ref 30.0–36.0)
MCV: 83.4 fL (ref 80.0–100.0)
MCV: 85.7 fL (ref 80.0–100.0)
Platelets: 387 10*3/uL (ref 150–400)
Platelets: 209 10*3/uL (ref 150–400)
RBC: 3.01 MIL/uL — ABNORMAL LOW (ref 4.22–5.81)
RBC: 2.37 MIL/uL — ABNORMAL LOW (ref 4.22–5.81)
RDW: 16.7 % — ABNORMAL HIGH (ref 11.5–15.5)
RDW: 15.6 % — ABNORMAL HIGH (ref 11.5–15.5)
WBC: 11.4 10*3/uL — ABNORMAL HIGH (ref 4.0–10.5)
WBC: 11.3 10*3/uL — ABNORMAL HIGH (ref 4.0–10.5)
nRBC: 0 % (ref 0.0–0.2)
nRBC: 0 % (ref 0.0–0.2)

## 2019-11-08 LAB — MAGNESIUM: Magnesium: 2.1 mg/dL (ref 1.7–2.4)

## 2019-11-08 LAB — TROPONIN I (HIGH SENSITIVITY)
Troponin I (High Sensitivity): 39 ng/L — ABNORMAL HIGH (ref <18)
Troponin I (High Sensitivity): 35 ng/L — ABNORMAL HIGH (ref <18)

## 2019-11-08 LAB — PHOSPHORUS: Phosphorus: 3.8 mg/dL (ref 2.5–4.6)

## 2019-11-08 LAB — GLUCOSE, CAPILLARY
Glucose-Capillary: 103 mg/dL — ABNORMAL HIGH (ref 70–99)
Glucose-Capillary: 133 mg/dL — ABNORMAL HIGH (ref 70–99)
Glucose-Capillary: 176 mg/dL — ABNORMAL HIGH (ref 70–99)
Glucose-Capillary: 177 mg/dL — ABNORMAL HIGH (ref 70–99)
Glucose-Capillary: 215 mg/dL — ABNORMAL HIGH (ref 70–99)

## 2019-11-08 LAB — BASIC METABOLIC PANEL
Anion gap: 6 (ref 5–15)
Anion gap: 7 (ref 5–15)
BUN: 27 mg/dL — ABNORMAL HIGH (ref 8–23)
BUN: 33 mg/dL — ABNORMAL HIGH (ref 8–23)
CO2: 27 mmol/L (ref 22–32)
CO2: 24 mmol/L (ref 22–32)
Calcium: 8.1 mg/dL — ABNORMAL LOW (ref 8.9–10.3)
Calcium: 7.5 mg/dL — ABNORMAL LOW (ref 8.9–10.3)
Chloride: 102 mmol/L (ref 98–111)
Chloride: 104 mmol/L (ref 98–111)
Creatinine, Ser: 0.74 mg/dL (ref 0.61–1.24)
Creatinine, Ser: 0.89 mg/dL (ref 0.61–1.24)
GFR calc Af Amer: >60 mL/min (ref >60)
GFR calc Af Amer: >60 mL/min (ref >60)
GFR calc non Af Amer: >60 mL/min (ref >60)
GFR calc non Af Amer: >60 mL/min (ref >60)
Glucose, Bld: 137 mg/dL — ABNORMAL HIGH (ref 70–99)
Glucose, Bld: 183 mg/dL — ABNORMAL HIGH (ref 70–99)
Potassium: 4.3 mmol/L (ref 3.5–5.1)
Potassium: 4.6 mmol/L (ref 3.5–5.1)
Sodium: 135 mmol/L (ref 135–145)
Sodium: 135 mmol/L (ref 135–145)

## 2019-11-08 LAB — HEPARIN LEVEL (UNFRACTIONATED): Heparin Unfractionated: 0.5 IU/mL (ref 0.30–0.70)

## 2019-11-08 LAB — PREPARE RBC (CROSSMATCH)

## 2019-11-08 LAB — PROTIME-INR
INR: 1.5 — ABNORMAL HIGH (ref 0.8–1.2)
Prothrombin Time: 17.7 seconds — ABNORMAL HIGH (ref 11.4–15.2)

## 2019-11-08 MED ORDER — SODIUM CHLORIDE 0.9 % IV SOLN
80.0000 mg | Freq: Once | INTRAVENOUS | Status: DC
  Filled 2019-11-08: qty 80

## 2019-11-08 MED ORDER — NOREPINEPHRINE 4 MG/250ML-% IV SOLN
0.0000 ug/min | INTRAVENOUS | Status: DC
  Administered 2019-11-08: 5 ug/min via INTRAVENOUS
  Filled 2019-11-08: qty 250

## 2019-11-08 MED ORDER — SODIUM CHLORIDE (PF) 0.9 % IJ SOLN
INTRAMUSCULAR | Status: AC
  Filled 2019-11-08: qty 50

## 2019-11-08 MED ORDER — GABAPENTIN 300 MG PO CAPS
300.0000 mg | ORAL_CAPSULE | Freq: Once | ORAL | Status: AC
  Administered 2019-11-08: 300 mg via ORAL
  Filled 2019-11-08: qty 1

## 2019-11-08 MED ORDER — FUROSEMIDE 10 MG/ML IJ SOLN: 40.0000 mg | Freq: Once | INTRAMUSCULAR | Status: DC

## 2019-11-08 MED ORDER — METHOCARBAMOL 1000 MG/10ML IJ SOLN
500.0000 mg | Freq: Three times a day (TID) | INTRAVENOUS | Status: DC | PRN
  Administered 2019-11-09 – 2019-11-17 (×8): 500 mg via INTRAVENOUS
  Filled 2019-11-08 (×8): qty 500

## 2019-11-08 MED ORDER — TRAVASOL 10 % IV SOLN
INTRAVENOUS | Status: AC
  Filled 2019-11-08: qty 1208.4

## 2019-11-08 MED ORDER — FUROSEMIDE 10 MG/ML IJ SOLN
INTRAMUSCULAR | Status: AC
  Filled 2019-11-08: qty 4

## 2019-11-08 MED ORDER — FENTANYL CITRATE (PF) 100 MCG/2ML IJ SOLN
50.0000 ug | Freq: Once | INTRAMUSCULAR | Status: AC
  Administered 2019-11-08: 50 ug via INTRAVENOUS
  Filled 2019-11-08: qty 2

## 2019-11-08 MED ORDER — SODIUM CHLORIDE 0.9% IV SOLUTION: Freq: Once | INTRAVENOUS | Status: DC

## 2019-11-08 MED ORDER — FUROSEMIDE 10 MG/ML IJ SOLN: 40.0000 mg | Freq: Once | INTRAMUSCULAR | Status: AC

## 2019-11-08 MED ORDER — PROCHLORPERAZINE EDISYLATE 10 MG/2ML IJ SOLN
10.0000 mg | Freq: Four times a day (QID) | INTRAMUSCULAR | Status: DC | PRN
  Administered 2019-11-10 – 2019-11-20 (×6): 10 mg via INTRAVENOUS
  Filled 2019-11-08 (×7): qty 2

## 2019-11-08 MED ORDER — SODIUM CHLORIDE 0.9% IV SOLUTION: Freq: Once | INTRAVENOUS | Status: AC

## 2019-11-08 MED ORDER — DIPHENHYDRAMINE HCL 50 MG/ML IJ SOLN
25.0000 mg | Freq: Once | INTRAMUSCULAR | Status: AC
  Administered 2019-11-08: 25 mg via INTRAVENOUS
  Filled 2019-11-08: qty 1

## 2019-11-08 MED ORDER — ACETAMINOPHEN 10 MG/ML IV SOLN
1000.0000 mg | Freq: Four times a day (QID) | INTRAVENOUS | Status: AC | PRN
  Administered 2019-11-08 – 2019-11-09 (×3): 1000 mg via INTRAVENOUS
  Filled 2019-11-08 (×3): qty 100

## 2019-11-08 MED ORDER — FENTANYL CITRATE (PF) 100 MCG/2ML IJ SOLN
25.0000 ug | INTRAMUSCULAR | Status: DC | PRN
  Administered 2019-11-09: 50 ug via INTRAVENOUS
  Administered 2019-11-09: 25 ug via INTRAVENOUS
  Administered 2019-11-09 – 2019-11-11 (×10): 50 ug via INTRAVENOUS
  Filled 2019-11-08 (×14): qty 2

## 2019-11-08 MED ORDER — ALBUMIN HUMAN 25 % IV SOLN
50.0000 g | Freq: Once | INTRAVENOUS | Status: AC
  Administered 2019-11-08: 50 g via INTRAVENOUS
  Filled 2019-11-08 (×2): qty 200

## 2019-11-08 MED ORDER — FUROSEMIDE 10 MG/ML IJ SOLN: 20.0000 mg | Freq: Once | INTRAMUSCULAR | Status: AC

## 2019-11-08 MED ORDER — METOPROLOL TARTRATE 5 MG/5ML IV SOLN
5.0000 mg | INTRAVENOUS | Status: DC
  Administered 2019-11-08 – 2019-11-13 (×29): 5 mg via INTRAVENOUS
  Filled 2019-11-08 (×29): qty 5

## 2019-11-08 MED ORDER — SODIUM CHLORIDE 0.9 % IV SOLN
8.0000 mg/h | INTRAVENOUS | Status: DC
  Filled 2019-11-08: qty 80

## 2019-11-08 MED ORDER — PANTOPRAZOLE SODIUM 40 MG IV SOLR
40.0000 mg | Freq: Two times a day (BID) | INTRAVENOUS | Status: DC
  Administered 2019-11-08 – 2019-11-13 (×10): 40 mg via INTRAVENOUS
  Filled 2019-11-08 (×11): qty 40

## 2019-11-08 MED ORDER — SODIUM CHLORIDE 0.9 % IV BOLUS
500.0000 mL | Freq: Once | INTRAVENOUS | Status: AC
  Administered 2019-11-08: 500 mL via INTRAVENOUS

## 2019-11-08 MED ORDER — IOHEXOL 300 MG/ML  SOLN
100.0000 mL | Freq: Once | INTRAMUSCULAR | Status: AC | PRN
  Administered 2019-11-08: 100 mL via INTRAVENOUS

## 2019-11-08 MED ORDER — FUROSEMIDE 10 MG/ML IJ SOLN
INTRAMUSCULAR | Status: AC
  Administered 2019-11-08: 20 mg via INTRAVENOUS
  Filled 2019-11-08: qty 4

## 2019-11-08 NOTE — Progress Notes (Addendum)
PROGRESS NOTE    Matthew Schwartz  MWN:[id number] DOB: Jan 13, 1933 DOA: 10/12/2019 PCP: Matthew Fess, MD   Chief Complaint  Patient presents with  . Abdominal Pain  . Vomiting    Brief Narrative:  [name] NIDDM, HLD, GERD, irritable bowel syndrome (constipation) status post left knee replacement 2009, PTCA 2000-->RCA well as prior prostate CA, former smoker Admit 10/12/2019 severe abdominal pain-ex lap 10/12/2019-perforated duodenal ulcer and remained on vent NSVT postop-echo showed Systolic HF--was on pressors and cardiology consulted-eventually extubated 6/17 + fevers 6/23= postoperative abscess-IR  Placed abd perc drain 6/24--growing Candida /on diflucan On 6/30 upper GI series were performed-and repeated Unfortunately spiked a fever without cause-found to have on CT 7/1 enlarging hematoma General surgery felt this was minimal and not the cause for his fever He has not had high-grade fever since his Antibiotics were adjusted 7/1 vancomycin and Primaxin but continues to have low-grade Heparin was held temporarily until 7/3 and restarted cautiously 7/3 after risk-benefit discussion with family  He has stabilized to some degree on stepdown unit he has had some pain and swelling in his upper extremities likely from the prior DVT in his left upper extremity-his right arm was imaged for DVT and found to be negative He has a long road ahead and TOC is working with patient and family to figure out next steps in terms of placement he may be able to eventually go to an [place]:   Principal Problem:   Severe sepsis with septic shock ([place]) Active Problems:   Hypotension   Perforated duodenal ulcer ([place])   Peritonitis ([place])   HTN (hypertension)   Malignant neoplasm of prostate ([place])   Hyperlipidemia   Obstipation   HLD (hyperlipidemia)   Diabetes mellitus type 2, uncontrolled, with complications ([place])   Perforated bowel ([place])   Acute respiratory  insufficiency   Elevated troponin level   Acute respiratory failure with hypoxia ([place])   PVCs (premature ventricular contractions)   Bilateral atelectasis   Acute abdominal pain   Palliative care by specialist   Goals of care, counseling/discussion   General weakness   Epigastric pain   Gastric ulcer   Hypokalemia   NSVT (nonsustained ventricular tachycardia) ([place])   Diarrhea   NSTEMI (non-ST elevated myocardial infarction) ([place])   Acute deep vein thrombosis (DVT) of left upper extremity ([place])   Acute systolic CHF (congestive heart failure) ([facility])   Anemia of chronic disease  1 septic shock secondary to peritonitis secondary to perforated duodenal ulcer Status post expiratory laparotomy, omental patch repair of duodenal ulcer per Dr. Ninfa Schwartz 10/12/2019 with postop leak/postop IR fistula tract to the duodenal bulb.  Currently afebrile.  Normal white count.  Patient noted to have upper GI series 10/28/2019 showing persistent small leak with repeat upper GI series done on 11/03/2018 one of the duodenal bulb likely related to prior surgical repair.  No definite persistent leak identified on today's examination.  Patient noted to have fluid collections 10/21/2019 with drain placement with cultures positive for Candida.  Status post drain placement.  On IV fluconazole.  Patient noted to have a T-max of 102.1 on 7/1/ 2021 with a change in antibiotics from IV vancomycin(s/p 5 days) to IV Merrem.  Blood cultures obtained with no growth to date.  Currently afebrile with a normal white count.  Dr. Verlon Schwartz discussed with general surgery, Dr. Harlow Schwartz on 10/30/2019 and was felt that enlarging hematoma was not source of infection as it would drain into the  midline wound.  Continue IV PPI, bowel rest, NG tube, TPN.  Patient s/p 8 days of IV Merrem.  IV of Merrem has been discontinued.  Patient afebrile.  Loose stools have decreased.  C. difficile PCR was negative.  Patient status post fistulogram which shows fistula  tract to the duodenal bulb ongoing.   Right upper quadrant drain intact.  Per general surgery likely need a repeat fistulogram next week to assess for closure. Continue IV fluconazole while drain in place as cultures were positive for Candida albicans.  Mobilize.  KUB unremarkable.  NG tube has been removed.  Will likely need SLP evaluation prior to p.o. intake.  General surgery recommending ongoing n.p.o. and drain for now.  Per general surgery.  IR following.  2.  Hypotension Patient initially early on this morning noted to have some systolic blood pressures in the mid to low 80s.  Repeat blood pressure manually with systolics in the 562Z.  Patient received a 500 cc bolus of normal saline.  Was called this afternoon as patient noted to have persistent hypotension with systolic blood pressures in the 70s, symptomatic with lightheadedness and dizziness with complaints of lower abdominal pain.  Patient denied any chest pain.  Normal saline 500 cc bolus ongoing.  Check a stat CBC with differential, basic metabolic profile, troponins.  Repeat EKG with T wave inversions in leads V3 through V6 similar to prior EKG from 10/25/2019.  Last albumin of 1.6 on 11/05/2019.  Patient on TNA.  We will give albumin 25% 50 g IV bolus x1.  Check a CT abdomen and pelvis.  Discontinue clonidine.  Decrease Lopressor to 5 mg IV every 4 hours.  Fluid boluses as needed.  Consult with PCCM for further evaluation.  Patient may need to be on pressors if blood pressure does not respond to fluid boluses.  3.  Acute hypoxic respiratory failure Status post extubation 10/15/2019.  NG tube has been discontinued.  Patient with sats of 97% on room air.    4.  Acute systolic heart failure/non-STEMI/coronary artery disease 2000/NSVT Euvolemic.  Blood pressure initially was stable however patient now noted this afternoon to be hypotensive with systolic blood pressures in the 70s.  Will discontinue clonidine patch.  Decrease IV beta-blocker to 5 mg  IV every 4 hours.  Patient noted to have a 5-6 beat run of nonsustained V. tach however asymptomatic on 11/04/2019.  Potassium at 4.3.  Magnesium at 2.2.  Phosphorus at 3.8.  IV beta-blocker dose had been increased to 10 mg every 4 hours per cardiology.  Due to hypotension will decrease metoprolol back to 5 mg IV every 4 hours.  Keep potassium > 4, keep magnesium > 2.   5.  Left upper extremity DVT cephalic/axillary vein Likely secondary to PICC line.  On IV heparin.  Follow.  6.  Right upper extremity swelling and pain and discomfort Heparin initially discontinued on 10/29/2019 after enlarging hematoma.  Heparin was resumed without a bolus on 10/31/2019 after Dr. Verlon Schwartz discussed with a specialist.  Right upper extremity discomfort initially felt to be secondary to a DVT however Dopplers negative.  Hemoglobin currently at 7.9.  Follow H&H.  7.  Well-controlled diabetes mellitus type 2 Hemoglobin A1c 6.6.  Blood glucose level of 133 this morning.  Patient noted not to be a diabetic prior to admission and elevated CBGs likely secondary to TPN.  Sliding scale insulin.  8.  Anemia of critical illness Status post transfusion of 2 units packed red blood cells.  H&H 0.9 this morning.  Patient noted now to be hypotensive with systolic blood pressures in the 70s with complaints of lightheadedness.  Repeat stat CBC this afternoon.  Transfusion threshold hemoglobin < 7.   9.  Malignancy of the prostate Outpatient follow-up.  10.  Tobacco abuse Tobacco cessation.  11.  Diarrhea Per RN patient with voluminous loose stools noted in rectal pouch a few days ago which had since improved.  C. difficile PCR negative.  Patient with less abdominal distention, positive bowel sounds.  KUB negative.  IV antibiotics discontinued.  Follow.  12.  Lower extremity pain Patient with complaints of leg pain overnight to the point he was unable to sleep or rest.  Stated he expresses complaints to the nurse that he could see  sitting however nobody could quite help manage his pain.  Currently pain has improved.  Lidoderm patch has been changed to be given at night.  Place on IV Tylenol 1 g every 6 hours as needed.  Place on Robaxin 500 mg IV every 6 hours as needed.  Supportive care.  Palliative care following and helping with pain management.    DVT prophylaxis: Heparin Code Status: Full Family Communication: Updated patient and daughter at bedside. Disposition:   Status is: Inpatient    Dispo:  Patient From: [facility]  Planned Disposition:    Expected discharge date: 11/27/19  Medically stable for discharge:   patient currently n.p.o., on TPN, on IV antifungal, patient still with ongoing IR fistula tract to the duodenal bulb.  Patient now with hypotension with systolic blood pressures in the 70s and being transferred back to the ICU.  Not stable for discharge.          Consultants:   Palliative care: Dr. Rowe Pavy 10/24/2019  Cardiology: Dr. Margaretann Loveless 10/13/2019  General surgery: Dr. Ninfa Schwartz 10/12/2019  Gastroenterology: Dr. Therisa Doyne 10/12/2019  PCCM: Dr. Halford Chessman 10/12/2019  Procedures:  CT abdomen and pelvis 10/21/2019, 10/29/2019  CT angiogram abdomen and pelvis 10/11/2019  Upper GI series 10/19/2019, 10/28/2019, 11/03/2019  2D echo 10/13/2019, 10/16/2019  Right upper extremity Doppler 11/02/2019  Left upper extremity Dopplers 10/17/2019  CT-guided drain placement right upper quadrant fluid collection per interventional radiology, Dr. Earleen Newport 10/22/2019  Exploratory laparotomy, omental patch repair of duodenal ulcer per Dr. Ninfa Schwartz 10/12/2019  Right IJ CVL 10/12/2019>>>> 10/13/2019  Right radial A-line 10/12/2019>>>> 10/15/2019  ETT 10/12/2019>>>> 10/15/2019  Left upper extremity PICC 10/14/2019  Fistulogram 11/06/2019, 10/30/2019  Significant Hospital Events   6/14 Admit, exploratory laparotomy, omental patch repair of duodenal ulcer 6/16 Off vasopressors, less drainage from abd drain /  clearing  6/17 extubated, weak cough, added high flow oxygen 6/18 start heparin gtt 6/20 d/c amiodarone 6/23 new fluid collection on CT abd > Drained by IR 6/24 To [place] 6/25 PCCM sign off 7/01 back to ICU with fever, tachypnea >> PCCM asked to reassess 7/2comfortable this morning     Significant Diagnostic Tests:   CT angio abd/pelvis 6/13 >> mild atherosclerosis, no significant arterial stenosis of mesenteric vasculature, no findings of bowel ischemia, colonic diverticulosis w/o diverticulitis, b/l renal cysts, 12 mm lesion upper pole Rt kidney  ECHO 6/15 >> poor image quality, appears to be regional wall motion variability but confident wall motion analysis is impossible even after contrast administration, LVEF ~ 60-63%, Grade I diastolic dysfunction, RV systolic function is moderately reduced, RV is mildly enlarged  6/19 venous doppler LUE>>acute deep vein thrombosis involving the left axillary vein, surrounding the PICC line. Findings consistent with acute  superficial vein thrombosis involving the left cephalic vein.  UGI 6/21 >> small leak from duodenum  CT ABD/Pelvis 6/23 >>interval midline abdominal incision with incomplete skin surgical closure, probable small hematomas within the anterior abdominal wall and the omentum. Nonspecific 4.3 cm air-fluid collection in the RUQ of the abdomen between the duodenal bulb and gallbladder, not containing enteric contrast. No residual leak from the duodenal bulb identified. New small bilateral pleural effusions with dependent L>R lower lobe opacities  Upper GI series 6/30 >>Small puckered stump of a leak at the site of the prior elongatedeak. Findings suggest resolving but persistent extraluminalcollection.  CT abd/pelvis 7/01 >>Anterior midline surgical incision is again noted. Large irregular high density is noted in this area most consistent with enlarging subcutaneous or intramuscular hematoma. Interval placement of pigtail  drainage catheter in the area of fluid collection noted on prior exam in the right upper quadrant. Fluid collection is significantly decompressed compared to prior exam. Micro Data:  SARS CoV2 6/13 >> negative RUQ fluid collection 6/24 >> moderate Candida albicans Blood 6/25 >>negative Blood culture 10/29/2019>>> negative     Antimicrobials:   IV Merrem 10/29/2019>>>>>> 11/05/2019  IV Zosyn 10/12/2019>>>> 10/29/2019  IV vancomycin 10/29/2019>>>> 11/03/2019  IV fluconazole 10/25/2019>>>>   Subjective: Patient seen earlier on this morning was sitting up in bed.  Patient stated did not have a good night sleep due to increased pain in lower extremities.  Patient stated was in a lot of pain saw the nurse sitting at the table however nobody could do anything for his pain and had a miserable night.  Patient has Lidoderm patch had to be held for 12 hours overnight.  This morning patient states lower extremity pain is improved.  Denies any chest pain.  No shortness of breath.  No nausea or emesis.    Was called by RN this afternoon that patient was again hypotensive with systolic blood pressures in the 70s, complaining of lightheadedness.  Went and assessed patient patient was also complaining of some lower abdominal pain.  Some dizziness.  No overt bleeding noted.  Patient denied any chest pain.  No shortness of breath.   Objective: Vitals:   11/08/19 1331 11/08/19 1336 11/08/19 1347 11/08/19 1352  BP: (!) 88/52 (!) 89/52 (!) 93/52 (!) 93/51  Pulse: 83 83 83 84  Resp:      Temp:      TempSrc:      SpO2: 97% 98% 98% 97%  Weight:      Height:        Intake/Output Summary (Last 24 hours) at 11/08/2019 1404 Last data filed at 11/08/2019 0200 Gross per 24 hour  Intake 1667.78 ml  Output 1100 ml  Net 567.78 ml   Filed Weights   11/04/19 0500 11/05/19 0500 11/06/19 0500  Weight: 91 kg 92.9 kg 93.4 kg    Examination:  General exam: NAD.  Dry mucous membranes. Respiratory system: CTA B.  No  wheezes, no crackles, no rhonchi.  Normal respiratory effort.  Cardiovascular system: RRR no murmurs rubs or gallops.  No JVD.  No lower extremity edema.  Gastrointestinal system: Abdomen is soft, less distended, positive bowel sounds, bandage on midline wound, right upper quadrant drain noted, some tenderness to palpation in the lower abdomen.  No rebound.  No guarding.  Central nervous system: Alert and oriented. No focal neurological deficits. Extremities: Symmetric 5 x 5 power. Skin: No rashes, lesions or ulcers Psychiatry: Judgement and insight appear normal. Mood & affect appropriate.  Data Reviewed: I have personally reviewed following labs and imaging studies  CBC: Recent Labs  Lab 11/02/19 0540 11/03/19 0430 11/04/19 0609 11/05/19 0618 11/06/19 0536 11/07/19 0505 11/08/19 0353  WBC 13.6*   < > 9.9 9.7 9.8 9.3 11.4*  NEUTROABS 10.3*  --   --   --   --   --   --   HGB 9.1*   < > 8.5* 8.4* 8.4* 8.4* 7.9*  HCT 28.7*   < > 27.4* 26.8* 26.5* 27.1* 25.1*  MCV 84.7   < > 85.1 85.4 84.9 85.2 83.4  PLT 367   < > 373 366 368 383 387   < > = values in this interval not displayed.    Basic Metabolic Panel: Recent Labs  Lab 11/04/19 0609 11/05/19 0618 11/06/19 0536 11/07/19 0505 11/08/19 0353  NA 137 138 137 132* 135  K 4.4 3.7 4.3 4.3 4.3  CL 106 106 105 103 102  CO2 24 24 24 25 27   GLUCOSE 181* 95 96 86 137*  BUN 31* 29* 28* 27* 27*  CREATININE 0.80 0.69 0.63 0.70 0.74  CALCIUM 7.6* 7.9* 8.0* 8.0* 8.1*  MG 2.5* 2.1 2.1 2.2 2.1  PHOS 3.2 2.7 2.6 3.1 3.8    GFR: Estimated Creatinine Clearance: 80.2 mL/min (by C-G formula based on SCr of 0.74 mg/dL).  Liver Function Tests: Recent Labs  Lab 11/02/19 0540 11/03/19 0521 11/05/19 0618  AST 49* 48* 64*  ALT 44 48* 68*  ALKPHOS 86 82 72  BILITOT 0.6 0.7 0.3  PROT 7.1 6.2* 6.4*  ALBUMIN 1.5* 1.4* 1.6*    CBG: Recent Labs  Lab 11/07/19 1229 11/07/19 1654 11/08/19 0017 11/08/19 0604 11/08/19 1112    GLUCAP 89 81 103* 133* 176*     Recent Results (from the past 240 hour(s))  Culture, blood (single)     Status: None   Collection Time: 10/29/19  7:06 PM   Specimen: BLOOD  Result Value Ref Range Status   Specimen Description   Final    BLOOD RIGHT ANTECUBITAL Performed at [facility], [place] [address] [place], [place] [address]    Special Requests   Final    BOTTLES DRAWN AEROBIC ONLY Blood Culture results may not be optimal due to an inadequate volume of blood received in culture bottles Performed at [facility] [address] [place], [place] [address]    Culture   Final    NO GROWTH 5 DAYS Performed at [facility], [place] [address] [place], [place] [address]    Report Status 11/03/2019 FINAL  Final  C Difficile Quick Screen (NO PCR Reflex)     Status: None   Collection Time: 11/05/19 12:17 PM   Specimen: STOOL  Result Value Ref Range Status   C Diff antigen NEGATIVE NEGATIVE Final   C Diff toxin NEGATIVE NEGATIVE Final   C Diff interpretation NEGATIVE  Final    Comment: Performed at [facility], [place] [address], [place], [place] [address]         Radiology Studies: DG Sinus/Fist Tube Chk-Non GI  Result Date: 11/06/2019 INDICATION: Status post drainage of abdominal abscess. EXAM: Injection of drainage catheter in right upper quadrant. MEDICATIONS: The patient is currently admitted to the hospital and receiving intravenous antibiotics. The antibiotics were administered within an appropriate time frame prior to the initiation of the procedure. ANESTHESIA/SEDATION: None. COMPLICATIONS: None immediate. PROCEDURE: Under fluoroscopy, contrast was injected through pre-existing catheter in  the right upper quadrant. This demonstrates filling of fistula extending into proximal duodenum with filling of the duodenum indicating patency. IMPRESSION: Upon injection of pigtail drainage catheter in right upper quadrant,  filling of proximal duodenum is noted consistent with patent fistula or connection with drained abscess. Electronically Signed   By: Marijo Conception M.D.   On: 11/06/2019 15:58        Scheduled Meds: . chlorhexidine  15 mL Mouth Rinse BID  . Chlorhexidine Gluconate Cloth  6 each Topical Daily  . insulin aspart  0-20 Units Subcutaneous Q6H  . insulin detemir  10 Units Subcutaneous BID  . lidocaine  2 patch Transdermal Q24H  . mouth rinse  15 mL Mouth Rinse q12n4p  . metoprolol tartrate  5 mg Intravenous Q4H  . pantoprazole (PROTONIX) IV  40 mg Intravenous Q12H  . simethicone  160 mg Oral QID  . sodium chloride flush  10-40 mL Intracatheter Q12H  . sodium chloride flush  5 mL Intracatheter Q8H  . sucralfate  1 g Oral BID   Continuous Infusions: . sodium chloride Stopped (11/05/19 1544)  . sodium chloride 10 mL/hr at 11/07/19 1300  . acetaminophen 1,000 mg (11/08/19 1309)  . albumin human    . fluconazole (DIFLUCAN) IV 400 mg (11/07/19 1752)  . heparin 1,200 Units/hr (11/07/19 1536)  . methocarbamol (ROBAXIN) IV    . TPN ADULT (ION) 95 mL/hr at 11/07/19 1807  . TPN ADULT (ION)       LOS: 27 days    Time spent: 45 minutes    Irine Seal, MD [facility]   To contact the attending provider between 7A-7P or the covering provider during after hours 7P-7A, please log into the web site [email or web address] and access using universal [place] password for that web site. If you do not have the password, please call the hospital operator.  11/08/2019, 2:04 PM

## 2019-11-08 NOTE — Progress Notes (Signed)
CRITICAL VALUE ALERT  Critical Value:  hgb 6.8  Date & Time Notied: 11/08/19 2230  Provider Notified: [facility] on call  Orders Received/Actions taken: no new orders, continuing to monitor

## 2019-11-08 NOTE — Progress Notes (Signed)
Patient only managed to sleep for no more than 2 hour tonight. PRN pain medication was not effective in treating patient's pain which remained at 7-8/10  in bilateral lower extremities with the sensation of aching, discomfort and restlessness. Patient continuous to call out with pain from room. Patient repeatedly states he wanted his lidocaine patch placed back on his legs. RN attempted to explain to patient purpose for leaving it off and reassure that it will be placed back on during day shift. RN and NT tried providing other alternative therapy in combination with pain med but patient still verbalized dissatisfaction.

## 2019-11-08 NOTE — Progress Notes (Signed)
   11/08/19 1112  Assess: MEWS Score  Temp 98.7 F (37.1 C)  BP (!) 83/57  Pulse Rate 84  Resp (!) 26  SpO2 96 %  O2 Device Room Air  Assess: MEWS Score  MEWS Temp 0  MEWS Systolic 1  MEWS Pulse 0  MEWS RR 2  MEWS LOC 0  MEWS Score 3  MEWS Score Color Yellow  Assess: if the MEWS score is Yellow or Red  Were vital signs taken at a resting state? Yes  Focused Assessment Documented focused assessment  Early Detection of Sepsis Score *See Row Information* Low  MEWS guidelines implemented *See Row Information* Yes  Treat  MEWS Interventions Other (Comment) (MD at bedside, NS bolus given, see note)  Take Vital Signs  Increase Vital Sign Frequency  Yellow: Q 2hr X 2 then Q 4hr X 2, if remains yellow, continue Q 4hrs  Escalate  MEWS: Escalate Yellow: discuss with charge nurse/RN and consider discussing with provider and RRT  Notify: Charge Nurse/RN  Name of Charge Nurse/RN Notified Randa Lynn, RN  Date Charge Nurse/RN Notified 11/08/19  Time Charge Nurse/RN Notified 1115  Notify: Provider  Provider Name/Title Grandville Silos, MD  Date Provider Notified 11/08/19  Time Provider Notified 1115  Notification Type Page  Notification Reason Change in status (Hypotensive, asymptomatic)  Response See new orders  Date of Provider Response 11/08/19  Time of Provider Response 1130

## 2019-11-08 NOTE — Progress Notes (Addendum)
CRITICAL VALUE ALERT  Critical Value:  Hgb 5.9  Date & Time Notified: 7/11 at 1406  Provider Notified: MD Grandville Silos at 1411  Orders Received/Actions taken: Stopped Heparin gtt, STAT CT of abdomen and pelvis. Notified MD Olalere as well, will place patient on levophed per MD Olalere. Type and Screen sent by Shyrl Numbers RN., bedside RN.

## 2019-11-08 NOTE — Progress Notes (Signed)
Pt had large incontinent void per shift report at 1400. Pt has not voided for my shift, Bladder scanned for 203 cc. Continue to monitor.   After 30 minutes of blood transfusion pt started having a wet unproductive cough causing him to dry heave, lungs sounded congested. Pt has received 1 liter fluids today plus blood and has not voided, suspect flash pulmonary edema. 20 lasix iv given per Dr. Grandville Silos. Pt 's symptoms resolved.   Pt continues to have severe pain in his right thigh. I have administered fentanyl 50 mg, heat pack,reposition, and now have applied lidocaine patches. Continue to monitor.   Pt is due for 1 more FFP, will hold off on protonix gtt and diflucan until those are finished so as not to send pt back into pulmonary edema.

## 2019-11-08 NOTE — Progress Notes (Signed)
Pt noted to be hypotensive this AM at 1112- BP 83/57, HR 84. Pt asymptomatic at that time, sitting up in bed talking to RN and daughter at bedside. MD Grandville Silos made aware and assessed patient at bedside. At that time, pt stated his pain was better but still c/o pain to his right abdomen (between drain and incision). Pt appeared to be resting comfortably in bed. 500 ml NS bolus given and pt responded well.   1306- VS rechecked and pt noted to be hypotensive again with BP as low as 78/46, HR 84. At this time pt was symptomatic, c/o lightheadedness and appeared to be very pale. Mottling noted to bilateral feet and all extremities very cool to touch. Pt remained A&O but was very drowsy, closed his eyes frequently. MD Grandville Silos paged immediately and RR called- MD Grandville Silos and Darrick Meigs assessed at bedside. Pt placed in trendelenburg position and a second 500 ml NS bolus was given. EKG done as ordered. At this time, pt c/o "pressure" to his lower abdomen and right side. Orders received to transfer to ICU- report given at bedside. Daughter still at bedside and aware of all changes and plan of care.

## 2019-11-08 NOTE — Progress Notes (Signed)
Patient ID: Matthew Schwartz, male   DOB: 1932-12-06, 86 y.o.   MRN: [id number]   CT shows right retroperitoneal hematoma with active extravasation. Needs heparin stopped, PRBC's, and FFP. If pressure does not respond quickly, needs IR consult for possible embolization. Hopefully will tamponade.

## 2019-11-08 NOTE — Progress Notes (Signed)
Notified by 4th floor staff RN in regards to patient having significant hypotension, BP 51V systolic, and already received 500 cc bolus. Discussed with RN to notify MD Grandville Silos in regards to hypotension and need of STAT CBC. Upon arrival patient was pale and hypotensive, MD Grandville Silos at bedside. MD Grandville Silos placed orders to transfer to ICU for closer monitoring. Patient will be transferring to 1230.

## 2019-11-08 NOTE — Progress Notes (Signed)
Patient noted early on to have a bout of hypotension, stat CBC done with a hemoglobin of 5.9.  Stat CT abdomen and pelvis ordered and was called by radiologist with preliminary findings of a new large retroperitoneal hematoma in the right psoas as the source with some active bleeding.  Patient has been typed and crossed and being transfused 2 units packed red blood cells.  Will check INR and if greater than 2 we will give 2 units of FFP.  Patient currently on a Levophed drip.  Case discussed with general surgery, Dr. [name].  PCCM reconsulted and are following.  Patient also to receive IV albumin.  Follow H&H.  Patient was placed on a Protonix drip which we will continue for the next 24 hours and transition back to IV PPI twice daily.  Heparin has been discontinued.  Supportive care.  Appreciate PCCM, general surgery's input and recommendations.  No charge.

## 2019-11-08 NOTE — Progress Notes (Signed)
Matthew Schwartz for Heparin Indication: chest pain/ACS, left upper extremity DVT  Allergies  Allergen Reactions  . Losartan Potassium Other (See Comments)    Unknown reaction that happened 15 years ago   Patient Measurements: Height: 5\' 11"  (180.3 cm) Weight: 93.4 kg (205 lb 14.6 oz) IBW/kg (Calculated) : 75.3 Heparin Dosing Weight: 90 kg  Vital Signs: Temp: 97.9 F (36.6 C) (07/11 0753) Temp Source: Oral (07/11 0753) BP: 139/72 (07/11 0753) Pulse Rate: 77 (07/11 0753)  Labs: Recent Labs    11/06/19 0536 11/06/19 0536 11/06/19 1025 11/06/19 1818 11/07/19 0505 11/08/19 0353  HGB 8.4*   < >  --   --  8.4* 7.9*  HCT 26.5*  --   --   --  27.1* 25.1*  PLT 368  --   --   --  383 387  HEPARINUNFRC  --   --    < > 0.61 0.54 0.50  CREATININE 0.63  --   --   --  0.70 0.74   < > = values in this interval not displayed.   Estimated Creatinine Clearance: 80.2 mL/min (by C-G formula based on SCr of 0.74 mg/dL).  Assessment: 86 yo male with perforated duodenal ulcer s/p omental patch repair.  Pharmacy consulted to dose IV heparin for NSTEMI as well as for acute deep vein thrombosis involving the left axillary vein, surrounding the PICC line. Findings consistent with acute superficial vein thrombosis involving the left cephalic vein. Patient with anemia of critical illness/post-op s/p transfusion 6/27.   Significant events: 6/27: hold heparin for Hgb 6.2 6/28: bloody stools, small amount of bleeding from surgical incision and JP drain  6/29: resume heparin 7/1: d/c heparin 2/2 hematoma at surgical site - of no consequence per surgery 7/2: d/w MD - continue to hold, reconsider resuming in next several days 7/3: resume heparin  Today, 11/08/2019  HL = 0.5, remains therapeutic on heparin infusion at 1200 units/hr  CBC: Hgb 7.9 low and slightly decreased; Pltc WNL  No issue with IV infusion reported. Pt does have hematoma at surgical site - surgery  aware, no worsening. No signs of overt bleeding.  Goal of Therapy:  Heparin level 0.3-0.7 units/ml Monitor platelets by anticoagulation protocol: Yes   Plan:   Continue heparin infusion at 1200 units/hr  Daily CBC, heparin level while on heparin infusion  Monitor for signs/symptoms of bleeding   Lindell Spar, PharmD, BCPS Clinical Pharmacist  11/08/2019 8:51 AM

## 2019-11-08 NOTE — Progress Notes (Signed)
Patient ID: Matthew Schwartz, male   DOB: February 09, 1933, 86 y.o.   MRN: [id number]   Patient with a drop in his Hgb to 5.9. Source of blood loss uncertain.  He is now 4 weeks out from perforation of an ulcer which was anterior and not a bleeding ulcer. I think he needs a stat CT with IV contrast to look for another source like a retroperitoneal bleed. (would not need oral contrast). He has been on IV heparin.  Could also consider placing an NG tube to look for blood in the stomach.  If bleeding ulcer is suspected, he needs a GI consult for upper endoscopy and if they don't feel comfortable scoping him then he would need a possible IR consult for A-gram and embolization.

## 2019-11-08 NOTE — Progress Notes (Signed)
Patient has been calling out for pain coming from his legs and abdomen all through tonight. RN administered PRN medications to which patient report ineffective and states " what can you do to fix my pain?". Both RN /NT have repositioned patient, provided hygiene, applied both hot/cold packs to BLE all of which did not help relief patient's pain/discomfort. Daughter called for update and suggested to give melatonin and utilize other method to help her dad sleeps. RN informed daughter that all alternatives have been carried out but is only effective for a short period of time. [facility] was notified and order for  RN to implement one time dose of gabapentin as needed. Patient continues to call out in pain shortly after.  Will continue to monitor and administer PRN medication per pt's request.

## 2019-11-08 NOTE — Progress Notes (Signed)
Daily Progress Note   Patient Name: Matthew Schwartz       Date: 11/08/2019 DOB: 06-04-1932  Age: 86 y.o. MRN#: [id number] Attending Physician: Eugenie Filler, MD Primary Care Physician: [name], MD Admit Date: 10/12/2019  Reason for Consultation/Follow-up: Establishing goals of care  Subjective: I saw and examined Matthew Schwartz today and spoke with his daughter at the bedside.     Patient off stepdown now on 4th floor, complains of pain in his back/legs overnight, now with pain in his RLQ area underneath his drain. Awake alert.    Length of Stay: 27  Current Medications: Scheduled Meds:  . chlorhexidine  15 mL Mouth Rinse BID  . Chlorhexidine Gluconate Cloth  6 each Topical Daily  . cloNIDine  0.2 mg Transdermal Weekly  . insulin aspart  0-20 Units Subcutaneous Q6H  . insulin detemir  10 Units Subcutaneous BID  . lidocaine  2 patch Transdermal Q24H  . mouth rinse  15 mL Mouth Rinse q12n4p  . metoprolol tartrate  10 mg Intravenous Q4H  . pantoprazole (PROTONIX) IV  40 mg Intravenous Q12H  . simethicone  160 mg Oral QID  . sodium chloride flush  10-40 mL Intracatheter Q12H  . sodium chloride flush  5 mL Intracatheter Q8H  . sucralfate  1 g Oral BID    Continuous Infusions: . sodium chloride Stopped (11/05/19 1544)  . sodium chloride 10 mL/hr at 11/07/19 1300  . acetaminophen    . fluconazole (DIFLUCAN) IV 400 mg (11/07/19 1752)  . heparin 1,200 Units/hr (11/07/19 1536)  . methocarbamol (ROBAXIN) IV    . TPN ADULT (ION) 95 mL/hr at 11/07/19 1807  . TPN ADULT (ION)      PRN Meds: sodium chloride, acetaminophen, acetaminophen, acetaminophen, bisacodyl, fentaNYL (SUBLIMAZE) injection, hydrALAZINE, ipratropium-albuterol, lip balm, LORazepam, melatonin, methocarbamol (ROBAXIN)  IV, ondansetron (ZOFRAN) IV, silver nitrate applicators, sodium chloride flush  Physical Exam         Elderly male resting in bed Has some dependent edema, appears to be improving.  Regular work of breathing S1-S2 Abdomen not distended  Vital Signs: BP (!) 105/57 (BP Location: Right Arm)   Pulse 86   Temp 98.7 F (37.1 C) (Oral)   Resp (!) 26   Ht 5\' 11"  (1.803 m)   Wt 93.4 kg   SpO2 99%  BMI 28.72 kg/m  SpO2: SpO2: 99 % O2 Device: O2 Device: Room Air O2 Flow Rate: O2 Flow Rate (L/min): 2 L/min  Intake/output summary:   Intake/Output Summary (Last 24 hours) at 11/08/2019 1301 Last data filed at 11/08/2019 0200 Gross per 24 hour  Intake 1667.78 ml  Output 1100 ml  Net 567.78 ml   LBM: Last BM Date: 11/07/19 Baseline Weight: Weight: 96.4 kg Most recent weight: Weight: 93.4 kg       Palliative Assessment/Data:    Flowsheet Rows     Most Recent Value  Intake Tab  Referral Department Hospitalist  Unit at Time of Referral ICU  Date Notified 10/23/19  Palliative Care Type New Palliative care  Reason for referral Clarify Goals of Care  Date of Admission 10/12/19  Date first seen by Palliative Care 10/24/19  # of days Palliative referral response time 1 Day(s)  # of days IP prior to Palliative referral 11  Clinical Assessment  Psychosocial & Spiritual Assessment  Palliative Care Outcomes     Palliative performance scale 30% Patient Active Problem List   Diagnosis Date Noted  . Gastric ulcer   . Hypokalemia   . NSVT (nonsustained ventricular tachycardia) ([place])   . Diarrhea   . Severe sepsis with septic shock ([place])   . NSTEMI (non-ST elevated myocardial infarction) ([place])   . Acute deep vein thrombosis (DVT) of left upper extremity ([place])   . Acute systolic CHF (congestive heart failure) ([place])   . Peritonitis ([place])   . Epigastric pain   . Palliative care by specialist   . Goals of care, counseling/discussion   . General weakness   . Acute abdominal pain     . Bilateral atelectasis 10/17/2019  . Acute respiratory insufficiency   . Elevated troponin level   . Acute respiratory failure with hypoxemia ([place])   . PVCs (premature ventricular contractions)   . Obstipation 10/12/2019  . HLD (hyperlipidemia) 10/12/2019  . Diabetes mellitus type 2, uncontrolled, with complications ([place]) 76/73/4193  . Perforated bowel ([place]) 10/12/2019  . Perforated duodenal ulcer ([place]) 10/12/2019  . Anemia 09/07/2016  . Coronary artery disease due to lipid rich plaque 02/16/2015  . Hyperlipidemia 02/16/2015  . Malignant neoplasm of prostate ([place]) 02/18/2014  . HTN (hypertension) 06/14/2013  . Dyslipidemia 06/14/2013  . GERD (gastroesophageal reflux disease) 06/14/2013    Palliative Care Assessment & Plan   Patient Profile:    Assessment: 86 year old gentleman with coronary artery disease status post percutaneous intervention in the year 2000, hypertension, dyslipidemia, diabetes who was admitted with perforated duodenal ulcer and peritonitis. Cardiology has been following for NSVT, intermittent left bundle branch block and anterolateral T wave inversions as well as elevated troponin, at some point is supposed to undergo cardiac catheterization after acute issues resolved. General surgery also following.  Has NG tube.  To continue TPN.  Plan for another upper GI this week.  Recommendations/Plan:  Full code/Full scope care  Continue current pain and non pain symptom management. On Fentanyl IV PRN, also on Tylenol, discussed with TR[facility] MD, agree with IV Robaxin trial.    TOC following for disposition discussions.    PMT will follow periodically, throughout the course of this hospitalization.    Goals of Care and Additional Recommendations:  Limitations on Scope of Treatment: Full Scope Treatment  Code Status:    Code Status Orders  (From admission, onward)         Start     Ordered   10/12/19 1208  Full code  Continuous        10/12/19 1212         Code Status History    Date Active Date Inactive Code Status Order ID Comments User Context   02/12/2014 1127 02/13/2014 0336 Full Code [id number]  Arne Cleveland, MD [place] Planning Activity    Advance Directive Documentation     Most Recent Value  Type of Advance Directive Healthcare Power of Attorney, Living will  Pre-existing out of facility DNR order (yellow form or pink MOST form) --  "MOST" Form in Place? --       Prognosis:   Unable to determine Guarded, continue to monitor hospital course and overall disease trajectory.  Discharge Planning:  To Be Determined Recommend:  LTACH, or SNF rehab with palliative  Care plan was discussed with patient and  daughter at bedside   Thank you for allowing the Palliative Medicine Team to assist in the care of this patient.   Total Time 25 Prolonged Time Billed  no    Greater than 50%  of this time was spent counseling and coordinating care related to the above assessment and plan.  Loistine Chance, MD  Please contact Palliative Medicine Team phone at [phone] for questions and concerns.

## 2019-11-08 NOTE — Progress Notes (Signed)
NAME:  Matthew Schwartz, MRN:  [id number], DOB:  May 11, 1932, LOS: 52 ADMISSION DATE:  10/12/2019, CONSULTATION DATE:  6/14 REFERRING MD:  [name], CHIEF COMPLAINT:  Abdominal pain   Brief History   86 y/o male, former smoker, presented with nausea/vomiting, abdominal pain and constipation x 4 to 5 days.  Seen twice prior to current admit for the same with imaging studies unremarkable for cause.  On return 6/14, seen by GI and found to have concern for acute abdomen and taken for exploratory laparotomy.  Found to have perforated duodenal ulcer s/p omental patch repair.  Remained on vent post op.  PCCM consulted for critical care management. Extubated on 6/17, had IR drain of abdominal fluid on 6/23. PCCM signed off on 6/25.  PCCM asked to see again for dyspnea on 7/1. He was transferred to the medical floor, anticipation was to transfer to select specialty for further long-term acute care Noted to be hypotensive today-investigation so far significant for low hematocrit  Past Medical History  Prostate cancer, HTN, GERD, HLD, DM  Significant Hospital Events   6/14 Admit, exploratory laparotomy, omental patch repair of duodenal ulcer 6/16 Off vasopressors, less drainage from abd drain / clearing  6/17 extubated, weak cough, added high flow oxygen 6/18 start heparin gtt 6/20 d/c amiodarone 6/23 new fluid collection on CT abd > Drained by IR 6/24 To [place] 6/25 PCCM sign off 7/01 back to ICU with fever, tachypnea >> PCCM asked to reassess 7/2 comfortable this morning  Consults:  Gastroenterology General surgery Cardiology  Procedures:  Rt IJ CVL 6/14 >> 6/15 Rt radial a line 6/14 >> 6/17 ETT 6/14 >> 6/17 LUE PICC 6/16 >>   Significant Diagnostic Tests:   CT angio abd/pelvis 6/13 >> mild atherosclerosis, no significant arterial stenosis of mesenteric vasculature, no findings of bowel ischemia, colonic diverticulosis w/o diverticulitis, b/l renal cysts, 12 mm lesion upper pole Rt kidney  ECHO  6/15 >> poor image quality, appears to be regional wall motion variability but confident wall motion analysis is impossible even after contrast administration, LVEF ~ 40-98%, Grade I diastolic dysfunction, RV systolic function is moderately reduced, RV is mildly enlarged  6/19 venous doppler LUE >> acute deep vein thrombosis involving the left axillary vein, surrounding the PICC line. Findings consistent with acute superficial vein thrombosis involving the left cephalic vein.  UGI 6/21 >> small leak from duodenum  CT ABD/Pelvis 6/23 >> interval midline abdominal incision with incomplete skin surgical closure, probable small hematomas within the anterior abdominal wall and the omentum. Nonspecific 4.3 cm air-fluid collection in the RUQ of the abdomen between the duodenal bulb and gallbladder, not containing enteric contrast. No residual leak from the duodenal bulb identified.  New small bilateral pleural effusions with dependent L>R lower lobe opacities  Upper GI series 6/30 >> Small puckered stump of a leak at the site of the prior elongated eak. Findings suggest resolving but persistent extraluminal collection.  CT abd/pelvis 7/01 >> Anterior midline surgical incision is again noted. Large irregular high density is noted in this area most consistent with enlarging subcutaneous or intramuscular hematoma.  Interval placement of pigtail drainage catheter in the area of fluid collection noted on prior exam in the right upper quadrant. Fluid collection is significantly decompressed compared to prior exam.  Micro Data:  SARS CoV2 6/13 >> negative RUQ fluid collection 6/24 >> moderate Candida albicans Blood 6/25 >>   Antimicrobials:  Zosyn 6/14 >> 7/01 Fluconazole 6/26 >> Meropenem 7/01 >> Vancomycin 7/01 >>  Interim history/subjective:  He has mild abdominal discomfort No diarrhea No hematochezia  Objective   Blood pressure (!) 93/51, pulse 84, temperature 98.4 F (36.9 C), temperature  source Oral, resp. rate 18, height 5\' 11"  (1.803 m), weight 91.9 kg, SpO2 97 %.        Intake/Output Summary (Last 24 hours) at 11/08/2019 1431 Last data filed at 11/08/2019 0200 Gross per 24 hour  Intake 1667.78 ml  Output 1100 ml  Net 567.78 ml   Filed Weights   11/05/19 0500 11/06/19 0500 11/08/19 1423  Weight: 92.9 kg 93.4 kg 91.9 kg    Examination: General: Comfortable HENT: Moist oral mucosa PULM: CTA B, normal effort CV: S1-S2 appreciated GI: BS+, soft, drain in place, midline scar well dressed MSK: normal bulk and tone Neuro: Awake and alert, following commands, no focality  Resolved Hospital Problem list   Septic shock, Acute respiratory failure with hypoxia  Assessment & Plan:  Peritonitis in setting of perforated duodenal ulcer s/p omental patch repair NPO Ice chips Upper GI series on 11/03/2018-no leak identified  RUQ fluid collection s/p IR drain with candida albicans in culture Fever 7/1 Abx per primary-recently completed meropenem -On fluconazole IV  Acute systolic heart failure in setting of sepsis > improved Demand ischemia> improved History of CAD, hypertension, hyperlipidemia Tele IV metoprolol clonidine -Medications that may affect his blood pressure being held at present for hypotension  L arm DVT related to PICC Heparin was resumed on 7/3 Would consider restarting heparin with out bolus if OK by surgery/TRH Hemoglobin was stable up until today when it dropped  DM2 Levemir/SSI accuchecks  Anemia of critical illness and chronic disease Monitor for bleeding Transfuse PRBC for Hgb < 7 gm/dL  Moderate Malnutrition in setting of critical illness TPN  Acute blood loss anemia with hemodynamic compromise Patient will have a stat CT abdomen with contrast-concern for retroperitoneal bleed, possible bleeding to the previous hematoma-has no obvious GI bleed at present Surgery input appreciated Case also discussed with GI by Dr. Grandville Silos Order  for 2 units packed cell transfusion  Unclear source of blood loss at present Patient transferred to intensive care unit and critical care asked to assist with care  Best practice:   PER TRH   Labs   CBC: Recent Labs  Lab 11/02/19 0540 11/03/19 0430 11/05/19 0618 11/06/19 0536 11/07/19 0505 11/08/19 0353 11/08/19 1334  WBC 13.6*   < > 9.7 9.8 9.3 11.4* 11.3*  NEUTROABS 10.3*  --   --   --   --   --  9.0*  HGB 9.1*   < > 8.4* 8.4* 8.4* 7.9* 5.9*  HCT 28.7*   < > 26.8* 26.5* 27.1* 25.1* 18.5*  MCV 84.7   < > 85.4 84.9 85.2 83.4 84.1  PLT 367   < > 366 368 383 387 313   < > = values in this interval not displayed.    Basic Metabolic Panel: Recent Labs  Lab 11/04/19 0609 11/05/19 0618 11/06/19 0536 11/07/19 0505 11/08/19 0353  NA 137 138 137 132* 135  K 4.4 3.7 4.3 4.3 4.3  CL 106 106 105 103 102  CO2 24 24 24 25 27   GLUCOSE 181* 95 96 86 137*  BUN 31* 29* 28* 27* 27*  CREATININE 0.80 0.69 0.63 0.70 0.74  CALCIUM 7.6* 7.9* 8.0* 8.0* 8.1*  MG 2.5* 2.1 2.1 2.2 2.1  PHOS 3.2 2.7 2.6 3.1 3.8   GFR: Estimated Creatinine Clearance: 79.6 mL/min (by C-G formula based  on SCr of 0.74 mg/dL). Recent Labs  Lab 11/06/19 0536 11/07/19 0505 11/08/19 0353 11/08/19 1334  WBC 9.8 9.3 11.4* 11.3*    Liver Function Tests: Recent Labs  Lab 11/02/19 0540 11/03/19 0521 11/05/19 0618  AST 49* 48* 64*  ALT 44 48* 68*  ALKPHOS 86 82 72  BILITOT 0.6 0.7 0.3  PROT 7.1 6.2* 6.4*  ALBUMIN 1.5* 1.4* 1.6*   No results for input(s): LIPASE, AMYLASE in the last 168 hours. No results for input(s): AMMONIA in the last 168 hours.  ABG    Component Value Date/Time   PHART 7.481 (H) 10/29/2019 2230   PCO2ART 33.9 10/29/2019 2230   PO2ART 156 (H) 10/29/2019 2230   HCO3 24.8 10/29/2019 2230   TCO2 22 10/12/2019 1717   ACIDBASEDEF 2.6 (H) 10/13/2019 0311   O2SAT 99.6 10/29/2019 2230     Coagulation Profile: No results for input(s): INR, PROTIME in the last 168  hours.  Cardiac Enzymes: No results for input(s): CKTOTAL, CKMB, CKMBINDEX, TROPONINI in the last 168 hours.  HbA1C: Hgb A1c MFr Bld  Date/Time Value Ref Range Status  10/12/2019 02:30 PM 6.6 (H) 4.8 - 5.6 % Final    Comment:    (NOTE) Pre diabetes:          5.7%-6.4%  Diabetes:              >6.4%  Glycemic control for   <7.0% adults with diabetes     CBG: Recent Labs  Lab 11/07/19 1229 11/07/19 1654 11/08/19 0017 11/08/19 0604 11/08/19 1112  GLUCAP 89 81 103* 133* 176*   Sherrilyn Rist, MD [place] PCCM Pager: [phone]

## 2019-11-08 NOTE — Progress Notes (Signed)
PHARMACY - TOTAL PARENTERAL NUTRITION CONSULT NOTE   Indication: Prolonged ileus  Patient Measurements: Height: '5\' 11"'  (180.3 cm) Weight: 93.4 kg (205 lb 14.6 oz) IBW/kg (Calculated) : 75.3 TPN AdjBW (KG): 93.6 Body mass index is 28.72 kg/m. Usual Weight: 90.7 kg   Assessment:  Pharmacy is consulted to start TPN in 86 yo male with prolonged ileus. Pt underwent exploratory laparotomy on 6/14 which revealed perforated duodenal ulcer.   Glucose / Insulin: hx of DM - on metformin PTA.  - CBGs improved to > 100 with reducing insulin to 50 units/TPN bag yesterday. Also remains on rSSI q6h (CBG goal range 100-150) - 3 units of SSI required in past 24h - Levemir 10 units BID per CCM started 6/18  Electrolytes: K+ 4.3 (target >/= 4.0) . Magnesium 2.1 (target >/= 2.0). All other lytes, including Corrected Ca, WNL.   Renal: SCr has been WNL, BUN 27 slightly elevated but stable.  LFTs / TGs: AST/ALT 64/68, slightly elevated (7/8); T.bili, Alk Phos WNL (7/8). TG 65, WNL (7/5) Prealbumin / albumin: Prealbumin 8.3 (7/5), 9.6 (6/28), 9.0 (6/21); Albumin 1.6 (7/8) Intake / Output; MIVF: NS at [facility]; NG removed; 1100 mL UOP charted in last 24h.  GI Imaging:  - 6/21 Upper GI series: small leak from superior margin of duodenal bulb. - 6/23 CT abdomen: 4.3 cm air-fluid collection in the right upper quadrant of the abdomen between the duodenal bulb and gallbladder,not containing enteric contrast. No residual leak from the duodenal bulb identified. - 7/1 Abdominal CT: large irregular high density noted most consistent with enlarging subcutaneous or IM hematoma - 7/6 Upper GI showed no leak.  Will need repeat sinogram without fistula to start oral feeds to maximize chance of this healing.   Surgeries / Procedures:  - 6/14 Exploratory laparotomy which revealed perforated duodenal ulcer - 6/24 RUQ drain placed > 10 ml > cultured - 6/25 removal R abd JP drain   Central access:  PICC Line 6/15 TPN start date:  6/15  Nutritional Goals: per RD recommendation on 7/6: Kcal:  2200-2400 Protein:  115-125 grams Fluid:  >2L/d  Goal TPN rate is 95 mL/hr (provides 121 g of protein and 2262 kcals per day)  Current Nutrition:  NPO   Plan:   Continue TPN at 95 mL/hr at 1800, goal rate   Electrolytes in TPN:   85 mEq/L of Na, 85 mEq/L of K , 4 mEq/L of Ca, 12 mEq/L of Magnesium, and 15 mmol/L of Phos. Cl:Ac ratio max Acetate  Standard MVI and trace elements to TPN  Continue resistant q6h SSI, levemir and adjust as needed   Continue 50 units of regular insulin/day in TPN as yesterday. Aiming to prevent hypoglycemia.    Continue MIVF at [facility] mL/hr   Monitor TPN labs on Mon/Thurs   Lindell Spar, PharmD, BCPS Clinical Pharmacist  11/08/2019 9:23 AM

## 2019-11-08 NOTE — Progress Notes (Signed)
27 Days Post-Op  Subjective/Chief Complaint: Complains of lower ext and back pain Denies abdominal pain  No drainage from IR drain   Objective: Vital signs in last 24 hours: Temp:  [97.7 F (36.5 C)-99 F (37.2 C)] 97.9 F (36.6 C) (07/11 0753) Pulse Rate:  [70-82] 77 (07/11 0753) Resp:  [18-26] 22 (07/11 0753) BP: (127-150)/(65-92) 139/72 (07/11 0753) SpO2:  [93 %-99 %] 98 % (07/11 0753) Last BM Date: 11/07/19  Intake/Output from previous day: 07/10 0701 - 07/11 0700 In: 2369.2 [I.V.:2169.1; IV Piggyback:200] Out: 1101 [Urine:1100; Stool:1] Intake/Output this shift: No intake/output data recorded.  Exam: Awake and alert Abdomen soft, wound clean  Lab Results:  Recent Labs    11/07/19 0505 11/08/19 0353  WBC 9.3 11.4*  HGB 8.4* 7.9*  HCT 27.1* 25.1*  PLT 383 387   BMET Recent Labs    11/07/19 0505 11/08/19 0353  NA 132* 135  K 4.3 4.3  CL 103 102  CO2 25 27  GLUCOSE 86 137*  BUN 27* 27*  CREATININE 0.70 0.74  CALCIUM 8.0* 8.1*   PT/INR No results for input(s): LABPROT, INR in the last 72 hours. ABG No results for input(s): PHART, HCO3 in the last 72 hours.  Invalid input(s): PCO2, PO2  Studies/Results: DG Abd 1 View  Result Date: 11/06/2019 CLINICAL DATA:  Acute abdominal pain and distention. EXAM: ABDOMEN - 1 VIEW COMPARISON:  None. FINDINGS: The bowel gas pattern is normal. Pigtail drainage catheter is noted in right upper quadrant. No radio-opaque calculi or other significant radiographic abnormality are seen. IMPRESSION: No evidence of bowel obstruction or ileus. Electronically Signed   By: Marijo Conception M.D.   On: 11/06/2019 12:00   DG Sinus/Fist Tube Chk-Non GI  Result Date: 11/06/2019 INDICATION: Status post drainage of abdominal abscess. EXAM: Injection of drainage catheter in right upper quadrant. MEDICATIONS: The patient is currently admitted to the hospital and receiving intravenous antibiotics. The antibiotics were administered within  an appropriate time frame prior to the initiation of the procedure. ANESTHESIA/SEDATION: None. COMPLICATIONS: None immediate. PROCEDURE: Under fluoroscopy, contrast was injected through pre-existing catheter in the right upper quadrant. This demonstrates filling of fistula extending into proximal duodenum with filling of the duodenum indicating patency. IMPRESSION: Upon injection of pigtail drainage catheter in right upper quadrant, filling of proximal duodenum is noted consistent with patent fistula or connection with drained abscess. Electronically Signed   By: Marijo Conception M.D.   On: 11/06/2019 15:58    Anti-infectives: Anti-infectives (From admission, onward)   Start     Dose/Rate Route Frequency Ordered Stop   11/06/19 1800  fluconazole (DIFLUCAN) IVPB 400 mg     Discontinue     400 mg 100 mL/hr over 120 Minutes Intravenous Every 24 hours 11/05/19 1850     10/31/19 2200  vancomycin (VANCOREADY) IVPB 1250 mg/250 mL  Status:  Discontinued        1,250 mg 166.7 mL/hr over 90 Minutes Intravenous Every 12 hours 10/31/19 1602 11/03/19 1152   10/29/19 2200  meropenem (MERREM) 1 g in sodium chloride 0.9 % 100 mL IVPB        1 g 200 mL/hr over 30 Minutes Intravenous Every 8 hours 10/29/19 2008 11/05/19 2207   10/29/19 2200  vancomycin (VANCOCIN) IVPB 1000 mg/200 mL premix  Status:  Discontinued        1,000 mg 200 mL/hr over 60 Minutes Intravenous Every 12 hours 10/29/19 2008 10/31/19 1602   10/25/19 1800  fluconazole (DIFLUCAN) IVPB  400 mg  Status:  Discontinued        400 mg 100 mL/hr over 120 Minutes Intravenous Every 24 hours 10/24/19 1750 11/05/19 1850   10/24/19 1830  fluconazole (DIFLUCAN) IVPB 800 mg  Status:  Discontinued        800 mg 200 mL/hr over 120 Minutes Intravenous  Once 10/24/19 1750 10/24/19 1755   10/24/19 1830  fluconazole (DIFLUCAN) IVPB 800 mg        800 mg 100 mL/hr over 240 Minutes Intravenous Every 24 hours 10/24/19 1756 10/24/19 2240   10/12/19 2000   piperacillin-tazobactam (ZOSYN) IVPB 3.375 g  Status:  Discontinued        3.375 g 12.5 mL/hr over 240 Minutes Intravenous Every 8 hours 10/12/19 1911 10/29/19 1945   10/12/19 1708  ceFAZolin (ANCEF) 2-4 GM/100ML-% IVPB       Note to Pharmacy: Bennye Alm   : cabinet override      10/12/19 1708 10/12/19 1849      Assessment/Plan: s/p Procedure(s): EXPLORATORY LAPAROTOMY omental patch repair perforated duodenal ulcer (N/A)  Perforated Duodenal Ulcer S/p Exploratory laparotomy, omental patch repair of duodenal ulcer - Dr. [name] - 6/14/2021POD#19 Postop leak Postop IR fistula tract to the duodenal bulb  Will allow ice chips sparingly.  Hold for any difficulty swallowing. Repeat fistulagram this week PT/OT Wound care    LOS: 27 days    Coralie Keens 11/08/2019

## 2019-11-09 DIAGNOSIS — K661 Hemoperitoneum: Secondary | ICD-10-CM

## 2019-11-09 DIAGNOSIS — I9589 Other hypotension: Secondary | ICD-10-CM

## 2019-11-09 DIAGNOSIS — E861 Hypovolemia: Secondary | ICD-10-CM

## 2019-11-09 DIAGNOSIS — J9601 Acute respiratory failure with hypoxia: Secondary | ICD-10-CM

## 2019-11-09 DIAGNOSIS — R188 Other ascites: Secondary | ICD-10-CM

## 2019-11-09 DIAGNOSIS — R578 Other shock: Secondary | ICD-10-CM | POA: Diagnosis not present

## 2019-11-09 LAB — MAGNESIUM
Magnesium: UNDETERMINED mg/dL (ref 1.7–2.4)
Magnesium: 2.2 mg/dL (ref 1.7–2.4)

## 2019-11-09 LAB — PREPARE FRESH FROZEN PLASMA: Unit division: 0

## 2019-11-09 LAB — PREPARE RBC (CROSSMATCH)

## 2019-11-09 LAB — COMPREHENSIVE METABOLIC PANEL
ALT: UNDETERMINED U/L (ref 0–44)
ALT: 56 U/L — ABNORMAL HIGH (ref 0–44)
AST: UNDETERMINED U/L (ref 15–41)
AST: 46 U/L — ABNORMAL HIGH (ref 15–41)
Albumin: UNDETERMINED g/dL (ref 3.5–5.0)
Albumin: 2.3 g/dL — ABNORMAL LOW (ref 3.5–5.0)
Alkaline Phosphatase: UNDETERMINED U/L (ref 38–126)
Alkaline Phosphatase: 48 U/L (ref 38–126)
Anion gap: UNDETERMINED (ref 5–15)
Anion gap: 8 (ref 5–15)
BUN: UNDETERMINED mg/dL (ref 8–23)
BUN: 35 mg/dL — ABNORMAL HIGH (ref 8–23)
CO2: UNDETERMINED mmol/L (ref 22–32)
CO2: 26 mmol/L (ref 22–32)
Calcium: UNDETERMINED mg/dL (ref 8.9–10.3)
Calcium: 7.9 mg/dL — ABNORMAL LOW (ref 8.9–10.3)
Chloride: UNDETERMINED mmol/L (ref 98–111)
Chloride: 103 mmol/L (ref 98–111)
Creatinine, Ser: UNDETERMINED mg/dL (ref 0.61–1.24)
Creatinine, Ser: 0.76 mg/dL (ref 0.61–1.24)
GFR calc Af Amer: UNDETERMINED mL/min (ref >60)
GFR calc Af Amer: >60 mL/min (ref >60)
GFR calc non Af Amer: UNDETERMINED mL/min (ref >60)
GFR calc non Af Amer: >60 mL/min (ref >60)
Glucose, Bld: UNDETERMINED mg/dL (ref 70–99)
Glucose, Bld: 161 mg/dL — ABNORMAL HIGH (ref 70–99)
Potassium: UNDETERMINED mmol/L (ref 3.5–5.1)
Potassium: 4.3 mmol/L (ref 3.5–5.1)
Sodium: UNDETERMINED mmol/L (ref 135–145)
Sodium: 137 mmol/L (ref 135–145)
Total Bilirubin: UNDETERMINED mg/dL (ref 0.3–1.2)
Total Bilirubin: 0.7 mg/dL (ref 0.3–1.2)
Total Protein: UNDETERMINED g/dL (ref 6.5–8.1)
Total Protein: 5.6 g/dL — ABNORMAL LOW (ref 6.5–8.1)

## 2019-11-09 LAB — DIFFERENTIAL
Abs Immature Granulocytes: UNDETERMINED 10*3/uL (ref 0.00–0.07)
Band Neutrophils: UNDETERMINED %
Basophils Absolute: UNDETERMINED 10*3/uL (ref 0.0–0.1)
Basophils Relative: UNDETERMINED %
Blasts: UNDETERMINED %
Eosinophils Absolute: UNDETERMINED 10*3/uL (ref 0.0–0.5)
Eosinophils Relative: UNDETERMINED %
Immature Granulocytes: UNDETERMINED %
Lymphocytes Relative: UNDETERMINED %
Lymphs Abs: UNDETERMINED 10*3/uL (ref 0.7–4.0)
Metamyelocytes Relative: UNDETERMINED %
Monocytes Absolute: UNDETERMINED 10*3/uL (ref 0.1–1.0)
Monocytes Relative: UNDETERMINED %
Myelocytes: UNDETERMINED %
Neutro Abs: UNDETERMINED 10*3/uL (ref 1.7–7.7)
Neutrophils Relative %: UNDETERMINED %
Other: UNDETERMINED %
Promyelocytes Relative: UNDETERMINED %
RBC Morphology: UNDETERMINED
Smear Review: UNDETERMINED
WBC Morphology: UNDETERMINED
nRBC: UNDETERMINED /100 WBC

## 2019-11-09 LAB — CBC
HCT: UNDETERMINED % (ref 39.0–52.0)
Hemoglobin: UNDETERMINED g/dL (ref 13.0–17.0)
MCH: UNDETERMINED pg (ref 26.0–34.0)
MCHC: UNDETERMINED g/dL (ref 30.0–36.0)
MCV: UNDETERMINED fL (ref 80.0–100.0)
Platelets: UNDETERMINED 10*3/uL (ref 150–400)
RBC: UNDETERMINED MIL/uL (ref 4.22–5.81)
RDW: UNDETERMINED % (ref 11.5–15.5)
WBC: UNDETERMINED 10*3/uL (ref 4.0–10.5)
nRBC: UNDETERMINED % (ref 0.0–0.2)

## 2019-11-09 LAB — CBC WITH DIFFERENTIAL/PLATELET
Abs Immature Granulocytes: 0.16 10*3/uL — ABNORMAL HIGH (ref 0.00–0.07)
Basophils Absolute: 0 10*3/uL (ref 0.0–0.1)
Basophils Relative: 0 %
Eosinophils Absolute: 0.1 10*3/uL (ref 0.0–0.5)
Eosinophils Relative: 1 %
HCT: 19.9 % — ABNORMAL LOW (ref 39.0–52.0)
Hemoglobin: 6.7 g/dL — CL (ref 13.0–17.0)
Immature Granulocytes: 1 %
Lymphocytes Relative: 8 %
Lymphs Abs: 0.9 10*3/uL (ref 0.7–4.0)
MCH: 29 pg (ref 26.0–34.0)
MCHC: 33.7 g/dL (ref 30.0–36.0)
MCV: 86.1 fL (ref 80.0–100.0)
Monocytes Absolute: 1 10*3/uL (ref 0.1–1.0)
Monocytes Relative: 9 %
Neutro Abs: 9.5 10*3/uL — ABNORMAL HIGH (ref 1.7–7.7)
Neutrophils Relative %: 81 %
Platelets: 234 10*3/uL (ref 150–400)
RBC: 2.31 MIL/uL — ABNORMAL LOW (ref 4.22–5.81)
RDW: 15.7 % — ABNORMAL HIGH (ref 11.5–15.5)
WBC: 11.7 10*3/uL — ABNORMAL HIGH (ref 4.0–10.5)
nRBC: 0 % (ref 0.0–0.2)

## 2019-11-09 LAB — TRIGLYCERIDES
Triglycerides: UNDETERMINED mg/dL (ref <150)
Triglycerides: 73 mg/dL (ref <150)

## 2019-11-09 LAB — BPAM FFP
Blood Product Expiration Date: 202107162359
Blood Product Expiration Date: 202107162359
ISSUE DATE / TIME: 202107111818
ISSUE DATE / TIME: 202107112109
Unit Type and Rh: 7300
Unit Type and Rh: 7300

## 2019-11-09 LAB — GLUCOSE, CAPILLARY
Glucose-Capillary: 136 mg/dL — ABNORMAL HIGH (ref 70–99)
Glucose-Capillary: 151 mg/dL — ABNORMAL HIGH (ref 70–99)
Glucose-Capillary: 154 mg/dL — ABNORMAL HIGH (ref 70–99)
Glucose-Capillary: 156 mg/dL — ABNORMAL HIGH (ref 70–99)

## 2019-11-09 LAB — PHOSPHORUS
Phosphorus: UNDETERMINED mg/dL (ref 2.5–4.6)
Phosphorus: 4 mg/dL (ref 2.5–4.6)

## 2019-11-09 LAB — HEMOGLOBIN AND HEMATOCRIT, BLOOD
HCT: 20 % — ABNORMAL LOW (ref 39.0–52.0)
HCT: 27.4 % — ABNORMAL LOW (ref 39.0–52.0)
Hemoglobin: 6.9 g/dL — CL (ref 13.0–17.0)
Hemoglobin: 9.4 g/dL — ABNORMAL LOW (ref 13.0–17.0)

## 2019-11-09 LAB — PREALBUMIN
Prealbumin: UNDETERMINED mg/dL (ref 18–38)
Prealbumin: 10.9 mg/dL — ABNORMAL LOW (ref 18–38)

## 2019-11-09 MED ORDER — SODIUM CHLORIDE 0.9% IV SOLUTION: Freq: Once | INTRAVENOUS | Status: AC

## 2019-11-09 MED ORDER — DIPHENHYDRAMINE HCL 25 MG PO CAPS
25.0000 mg | ORAL_CAPSULE | Freq: Once | ORAL | Status: AC
  Administered 2019-11-09: 25 mg via ORAL
  Filled 2019-11-09: qty 1

## 2019-11-09 MED ORDER — FUROSEMIDE 10 MG/ML IJ SOLN
20.0000 mg | Freq: Once | INTRAMUSCULAR | Status: AC
  Administered 2019-11-09: 20 mg via INTRAVENOUS
  Filled 2019-11-09: qty 2

## 2019-11-09 MED ORDER — ACETAMINOPHEN 325 MG PO TABS
650.0000 mg | ORAL_TABLET | Freq: Once | ORAL | Status: AC
  Administered 2019-11-09: 650 mg via ORAL
  Filled 2019-11-09: qty 2

## 2019-11-09 MED ORDER — ACETAMINOPHEN 10 MG/ML IV SOLN
1000.0000 mg | Freq: Four times a day (QID) | INTRAVENOUS | Status: AC | PRN
  Filled 2019-11-09: qty 100

## 2019-11-09 MED ORDER — TRAVASOL 10 % IV SOLN
INTRAVENOUS | Status: AC
  Filled 2019-11-09: qty 1208.4

## 2019-11-09 NOTE — Progress Notes (Signed)
CRITICAL VALUE ALERT  Critical Value: hgb 6.9 Date & Time Notied:  0030 11/09/19  Provider Notified: [facility] on call  Orders Received/Actions taken: recheck with morning labs

## 2019-11-09 NOTE — Progress Notes (Signed)
Progress Note: General Surgery Service   Chief Complaint/Subjective: Hemoglobin drop yesterday, CT showing RP bleeding, heparin stopped. Blood transfusions yesterday and today, patient and family in discussion about possible hospice pursuit  Objective: Vital signs in last 24 hours: Temp:  [97.6 F (36.4 C)-98.7 F (37.1 C)] 98.1 F (36.7 C) (07/12 0838) Pulse Rate:  [82-101] 91 (07/12 0838) Resp:  [13-36] 26 (07/12 0838) BP: (78-158)/(42-71) 150/62 (07/12 0838) SpO2:  [94 %-100 %] 96 % (07/12 0838) FiO2 (%):  [30 %] 30 % (07/11 2114) Weight:  [91.9 kg-95.8 kg] 95.8 kg (07/12 0444) Last BM Date: 10/29/19  Intake/Output from previous day: 07/11 0701 - 07/12 0700 In: 4887.6 [I.V.:3029.6; Blood:1336.7; IV Piggyback:521.3] Out: 2450 [Urine:2450] Intake/Output this shift: Total I/O In: 150.3 [I.V.:150.3] Out: 300 [Urine:300]  Gen: NAD  Resp: nonlabored  Card: RRR  Abd: soft, open wound with healthy granulation tissue at base  Lab Results: CBC  Recent Labs    11/09/19 0444 11/09/19 0540  WBC SPECIMEN CONTAMINATED, UNABLE TO PERFORM TEST(S). 11.7*  HGB SPECIMEN CONTAMINATED, UNABLE TO PERFORM TEST(S). 6.7*  HCT SPECIMEN CONTAMINATED, UNABLE TO PERFORM TEST(S). 19.9*  PLT SPECIMEN CONTAMINATED, UNABLE TO PERFORM TEST(S). [place]    11/09/19 0444 11/09/19 0540  NA SPECIMEN CONTAMINATED, UNABLE TO PERFORM TEST(S). 137  K SPECIMEN CONTAMINATED, UNABLE TO PERFORM TEST(S). 4.3  CL SPECIMEN CONTAMINATED, UNABLE TO PERFORM TEST(S). 103  CO2 SPECIMEN CONTAMINATED, UNABLE TO PERFORM TEST(S). 26  GLUCOSE SPECIMEN CONTAMINATED, UNABLE TO PERFORM TEST(S). 161*  BUN SPECIMEN CONTAMINATED, UNABLE TO PERFORM TEST(S). 35*  CREATININE SPECIMEN CONTAMINATED, UNABLE TO PERFORM TEST(S). 0.76  CALCIUM SPECIMEN CONTAMINATED, UNABLE TO PERFORM TEST(S). 7.9*   PT/INR Recent Labs    11/08/19 1525  LABPROT 17.7*  INR 1.5*   ABG No results for input(s): PHART, HCO3 in  the last 72 hours.  Invalid input(s): PCO2, PO2  Anti-infectives: Anti-infectives (From admission, onward)   Start     Dose/Rate Route Frequency Ordered Stop   11/06/19 1800  fluconazole (DIFLUCAN) IVPB 400 mg     Discontinue     400 mg 100 mL/hr over 120 Minutes Intravenous Every 24 hours 11/05/19 1850     10/31/19 2200  vancomycin (VANCOREADY) IVPB 1250 mg/250 mL  Status:  Discontinued        1,250 mg 166.7 mL/hr over 90 Minutes Intravenous Every 12 hours 10/31/19 1602 11/03/19 1152   10/29/19 2200  meropenem (MERREM) 1 g in sodium chloride 0.9 % 100 mL IVPB        1 g 200 mL/hr over 30 Minutes Intravenous Every 8 hours 10/29/19 2008 11/05/19 2207   10/29/19 2200  vancomycin (VANCOCIN) IVPB 1000 mg/200 mL premix  Status:  Discontinued        1,000 mg 200 mL/hr over 60 Minutes Intravenous Every 12 hours 10/29/19 2008 10/31/19 1602   10/25/19 1800  fluconazole (DIFLUCAN) IVPB 400 mg  Status:  Discontinued        400 mg 100 mL/hr over 120 Minutes Intravenous Every 24 hours 10/24/19 1750 11/05/19 1850   10/24/19 1830  fluconazole (DIFLUCAN) IVPB 800 mg  Status:  Discontinued        800 mg 200 mL/hr over 120 Minutes Intravenous  Once 10/24/19 1750 10/24/19 1755   10/24/19 1830  fluconazole (DIFLUCAN) IVPB 800 mg        800 mg 100 mL/hr over 240 Minutes Intravenous Every 24 hours 10/24/19 1756 10/24/19 2240   10/12/19 2000  piperacillin-tazobactam (ZOSYN)  IVPB 3.375 g  Status:  Discontinued        3.375 g 12.5 mL/hr over 240 Minutes Intravenous Every 8 hours 10/12/19 1911 10/29/19 1945   10/12/19 1708  ceFAZolin (ANCEF) 2-4 GM/100ML-% IVPB       Note to Pharmacy: Bennye Alm   : cabinet override      10/12/19 1708 10/12/19 1849      Medications: Scheduled Meds: . sodium chloride   Intravenous Once  . sodium chloride   Intravenous Once  . chlorhexidine  15 mL Mouth Rinse BID  . Chlorhexidine Gluconate Cloth  6 each Topical Daily  . furosemide  20 mg Intravenous Once  .  furosemide  20 mg Intravenous Once  . insulin aspart  0-20 Units Subcutaneous Q6H  . insulin detemir  10 Units Subcutaneous BID  . lidocaine  2 patch Transdermal Q24H  . mouth rinse  15 mL Mouth Rinse q12n4p  . metoprolol tartrate  5 mg Intravenous Q4H  . pantoprazole (PROTONIX) IV  40 mg Intravenous Q12H  . simethicone  160 mg Oral QID  . sodium chloride flush  10-40 mL Intracatheter Q12H  . sodium chloride flush  5 mL Intracatheter Q8H  . sucralfate  1 g Oral BID   Continuous Infusions: . sodium chloride Stopped (11/05/19 1544)  . sodium chloride 10 mL/hr at 11/07/19 1300  . acetaminophen Stopped (11/09/19 0537)  . fluconazole (DIFLUCAN) IV Stopped (11/08/19 2337)  . methocarbamol (ROBAXIN) IV Stopped (11/09/19 6834)  . norepinephrine (LEVOPHED) Adult infusion Stopped (11/08/19 1910)  . TPN ADULT (ION) 95 mL/hr at 11/09/19 0835   PRN Meds:.sodium chloride, acetaminophen, acetaminophen, acetaminophen, bisacodyl, fentaNYL (SUBLIMAZE) injection, hydrALAZINE, ipratropium-albuterol, lip balm, LORazepam, melatonin, methocarbamol (ROBAXIN) IV, ondansetron (ZOFRAN) IV, prochlorperazine, silver nitrate applicators, sodium chloride flush  Assessment/Plan: s/p Procedure(s): EXPLORATORY LAPAROTOMY omental patch repair perforated duodenal ulcer 10/12/2019  Will discuss with IR for concern of fistula/leak NPO, TPN now Drain output continues to be low     LOS: 28 days   Mickeal Skinner, MD [facility]

## 2019-11-09 NOTE — Progress Notes (Signed)
CRITICAL VALUE ALERT  Critical Value:  hgb 6.7  Date & Time Notied: 11/09/19 0630  Provider Notified: [facility] on call   Orders Received/Actions taken: 1 unit PRBC

## 2019-11-09 NOTE — Progress Notes (Signed)
PROGRESS NOTE    Matthew Schwartz  OXB:[id number] DOB: 01/01/1933 DOA: 10/12/2019 PCP: [name], MD   Chief Complaint  Patient presents with  . Abdominal Pain  . Vomiting    Brief Narrative:  [name] NIDDM, HLD, GERD, irritable bowel syndrome (constipation) status post left knee replacement 2009, PTCA 2000-->RCA well as prior prostate CA, former smoker Admit 10/12/2019 severe abdominal pain-ex lap 10/12/2019-perforated duodenal ulcer and remained on vent NSVT postop-echo showed Systolic HF--was on pressors and cardiology consulted-eventually extubated 6/17 + fevers 6/23= postoperative abscess-IR  Placed abd perc drain 6/24--growing Candida /on diflucan On 6/30 upper GI series were performed-and repeated Unfortunately spiked a fever without cause-found to have on CT 7/1 enlarging hematoma General surgery felt this was minimal and not the cause for his fever He has not had high-grade fever since his Antibiotics were adjusted 7/1 vancomycin and Primaxin but continues to have low-grade Heparin was held temporarily until 7/3 and restarted cautiously 7/3 after risk-benefit discussion with family  He has stabilized to some degree on stepdown unit he has had some pain and swelling in his upper extremities likely from the prior DVT in his left upper extremity-his right arm was imaged for DVT and found to be negative He has a long road ahead and TOC is working with patient and family to figure out next steps in terms of placement he may be able to eventually go to an [place]:   Principal Problem:   Severe sepsis with septic shock ([place]) Active Problems:   Hypotension   Hemorrhagic shock (HCC):11/08/2019   Perforated duodenal ulcer ([place])   Peritonitis ([place])   Nontraumatic retroperitoneal hematoma: 11/08/2019   HTN (hypertension)   Malignant neoplasm of prostate ([place])   Hyperlipidemia   Obstipation   HLD (hyperlipidemia)   Diabetes mellitus type 2,  uncontrolled, with complications ([place])   Perforated bowel ([place])   Acute respiratory insufficiency   Elevated troponin level   Acute respiratory failure with hypoxia (HCC)   PVCs (premature ventricular contractions)   Bilateral atelectasis   Acute abdominal pain   Palliative care by specialist   Goals of care, counseling/discussion   General weakness   Epigastric pain   Gastric ulcer   Hypokalemia   NSVT (nonsustained ventricular tachycardia) ([place])   Diarrhea   NSTEMI (non-ST elevated myocardial infarction) ([place])   Acute deep vein thrombosis (DVT) of left upper extremity (HCC)   Acute systolic CHF (congestive heart failure) (HCC)   Anemia of chronic disease  1 septic shock secondary to peritonitis secondary to perforated duodenal ulcer Status post expiratory laparotomy, omental patch repair of duodenal ulcer per Dr. 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  Currently afebrile.  Normal white count.  Patient noted to have upper GI series 10/28/2019 showing persistent small leak with repeat upper GI series done on 11/03/2018 one of the duodenal bulb likely related to prior surgical repair.  No definite persistent leak identified on today's examination.  Patient noted to have fluid collections 10/21/2019 with drain placement with cultures positive for Candida.  Status post drain placement.  On IV fluconazole.  Patient noted to have a T-max of 102.1 on 7/1/ 2021 with a change in antibiotics from IV vancomycin(s/p 5 days) to IV Merrem.  Blood cultures obtained with no growth to date.  Currently afebrile with a normal white count.  Dr. [name] discussed with general surgery, Dr. [name] on 10/30/2019 and was felt that enlarging hematoma  was not source of infection as it would drain into the midline wound.  Continue IV PPI, bowel rest, NG tube, TPN.  Patient s/p 8 days of IV Merrem.  IV of Merrem has been discontinued.  Patient afebrile.  Loose stools have decreased.   C. difficile PCR was negative.  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   Right upper quadrant drain intact.  Per general surgery likely need a repeat fistulogram this week to assess for closure. Continue IV fluconazole while drain in place as cultures were positive for Candida albicans.  Mobilize.  KUB unremarkable.  NG tube has been removed.  Will likely need SLP evaluation prior to p.o. intake.  General surgery recommending ongoing n.p.o. and drain for now.  Per general surgery.  IR following.  2.  Hypotension/hemorrhagic shock secondary to large retroperitoneal Patient noted to be hypotensive 7/85/8850 with systolic blood pressures in the 70s requiring transfer to the ICU and pressors of Levophed.  CBC done noted a hemoglobin of 5.9.  CT abdomen and pelvis done consistent with a large retroperitoneal hematoma with evidence of active bleed within the right psoas muscle.  Persistent but decreased size of right rectus muscle hematoma.  Patient was transferred to the ICU and transfused 2 units of FFP, 2 units of packed red blood cells on 11/08/2019.  Levophed subsequently titrated off.  Hypotension improved.  Hemoglobin this morning at 6.7.  Patient to be transfused 2 units of packed red blood cells.  IV heparin has been discontinued and patient no longer a candidate for anticoagulation.  Clonidine patch discontinued.  Nitroglycerin paste discontinued.  IV Lopressor dose discontinued.  Follow H&H.  PCCM consulted and are following.  3.  Acute hypoxic respiratory failure Status post extubation 10/15/2019.  NG tube has been discontinued.  Patient with sats of 96% on room air.    4.  Acute systolic heart failure/non-STEMI/coronary artery disease 2000/NSVT Euvolemic.  Blood pressure initially was stable however patient now noted th to be hypotensive on 2/77/4128 with systolic blood pressures in the 70s.  Clonidine patch discontinued.  IV beta-blocker decreased to 5  mg every 4 hours.  Early on in the hospitalization patient noted to have a 5-6 beat run of nonsustained V. tach however asymptomatic on 11/04/2019.  Potassium at 4.3.  Magnesium at 2.2.  Phosphorus at 4.  IV beta-blocker dose had been increased to 10 mg every 4 hours per cardiology however due to hemorrhagic shock IV Lopressor has been decreased.  Continue current rate of IV Lopressor and if develops nonsustained V. tach uptitrate Lopressor as tolerated by blood pressure back to 10 mg IV every 4 hours. Keep potassium > 4, keep magnesium > 2.   5.  Left upper extremity DVT cephalic/axillary vein Likely secondary to PICC line.  IV heparin discontinued 11/08/2019 due to hemorrhagic shock secondary to large retroperitoneal hematoma.  Follow.  6.  Right upper extremity swelling and pain and discomfort Heparin initially discontinued on 10/29/2019 after enlarging hematoma.  Heparin was resumed without a bolus on 10/31/2019 after Dr. [name] discussed with a specialist.  Right upper extremity discomfort initially felt to be secondary to a DVT however Dopplers negative.  Patient now with large retroperitoneal hematoma with hemorrhagic shock on 11/08/2019 and as such heparin discontinued.  Patient likely not a candidate for anticoagulation.  Supportive care.    7.  Well-controlled diabetes mellitus type 2 Hemoglobin A1c 6.6.  Blood glucose level of 156 this morning.  Patient  noted not to be a diabetic prior to admission and elevated CBGs likely secondary to TPN.  Sliding scale insulin.  8.  Anemia of critical illness/acute blood loss anemia Status post transfusion of 2 units packed red blood cells (10/25/2019).  Patient underwent hemorrhagic shock on 11/08/2019 secondary to large retroperitoneal hematoma.  Status post transfusion of 2 units packed red blood cells 11/08/2019 and 2 units of FFP.  Hemoglobin at 6.7 this morning.  Transfused 2 more units packed red blood cells.  Follow H&H.  Transfusion threshold hemoglobin  <7.    9.  Malignancy of the prostate Outpatient follow-up.  10.  Tobacco abuse Tobacco cessation.  11.  Diarrhea Per RN patient with voluminous loose stools noted in rectal pouch a few days ago which had since improved.  C. difficile PCR negative.  Patient with less abdominal distention, positive bowel sounds.  KUB negative.  IV antibiotics discontinued.  Diarrhea improved.  Follow.  12.  Lower extremity pain Secondary to retroperitoneal hematoma.  Some clinical improvement after transfusion of packed red blood cells and FFP in addition to current pain management.  Continue IV Tylenol as needed, Lidoderm patch, IV Robaxin.  Supportive care.  Palliative care following and helping with pain management.   13.  Prognosis Patient with multiple comorbidities, complicated course during this hospitalization.  Patient admitted with sepsis secondary to peritonitis, duodenal ulcer with perforation status post repair, complicated by non-STEMI, acute CHF.  Patient with ongoing fistula currently n.p.o. on TPN, left upper extremity DVT placed on heparin however complicated by abdominal hematoma and now with large retroperitoneal hematoma leading to hemorrhagic shock requiring pressors and currently off pressors.  Patient transfused total of 6 units packed red blood cells as well as 2 units of FFP.  Palliative care following.  Patient with a poor prognosis.    DVT prophylaxis: SCDs Code Status: Full Family Communication: Updated patient and daughter at bedside. Disposition:   Status is: Inpatient    Dispo:  Patient From: [facility]  Planned Disposition:    Expected discharge date: 11/27/19  Medically stable for discharge:   Patient currently not medically stable for discharge.  Patient with a hemorrhagic shock on 11/08/2019 resuscitated with blood products and had to be placed on a Levophed drip.  Patient on TPN, IV antifungal, patient still with ongoing IR fistula tract to the duodenal  bulb.  Patient in ICU.        Consultants:   Palliative care: Dr. Rowe Pavy 10/24/2019  Cardiology: Dr. Margaretann Loveless 10/13/2019  General surgery: Dr. [name] 10/12/2019  Gastroenterology: Dr. Therisa Doyne 10/12/2019  PCCM: Dr. Halford Chessman 10/12/2019  Procedures:  CT abdomen and pelvis 10/21/2019, 10/29/2019  CT angiogram abdomen and pelvis 10/11/2019  Upper GI series 10/19/2019, 10/28/2019, 11/03/2019  2D echo 10/13/2019, 10/16/2019  Right upper extremity Doppler 11/02/2019  Left upper extremity Dopplers 10/17/2019  CT-guided drain placement right upper quadrant fluid collection per interventional radiology, Dr. Earleen Newport 10/22/2019  Exploratory laparotomy, omental patch repair of duodenal ulcer per Dr. [name] 10/12/2019  Right IJ CVL 10/12/2019>>>> 10/13/2019  Right radial A-line 10/12/2019>>>> 10/15/2019  ETT 10/12/2019>>>> 10/15/2019  Left upper extremity PICC 10/14/2019  Fistulogram 11/06/2019, 10/30/2019  Transfusion 2 units packed red blood cells 10/25/2019  Transfusion of 2 units packed red blood cells 11/08/2019  Transfusion of 2 units FFP 11/08/2019  Transfuse 2 units packed red blood cells 11/09/2019  Significant Hospital Events   6/14 Admit, exploratory laparotomy, omental patch repair of duodenal ulcer 6/16 Off vasopressors, less drainage from abd drain /  clearing  6/17 extubated, weak cough, added high flow oxygen 6/18 start heparin gtt 6/20 d/c amiodarone 6/23 new fluid collection on CT abd > Drained by IR 6/24 To [facility] 6/25 PCCM sign off 7/01 back to ICU with fever, tachypnea >> PCCM asked to reassess 7/2comfortable this morning 11/08/2019 patient noted to go into hemorrhagic shock got transferred to the ICU placed on a Levophed drip which was subsequently weaned off.  Transfused 2 units packed red blood cells, 2 units FFP and CT abdomen the pelvis which was done concerning for large retroperitoneal hematoma.     Significant Diagnostic Tests:   CT angio abd/pelvis 6/13 >> mild  atherosclerosis, no significant arterial stenosis of mesenteric vasculature, no findings of bowel ischemia, colonic diverticulosis w/o diverticulitis, b/l renal cysts, 12 mm lesion upper pole Rt kidney  ECHO 6/15 >> poor image quality, appears to be regional wall motion variability but confident wall motion analysis is impossible even after contrast administration, LVEF ~ 29-93%, Grade I diastolic dysfunction, RV systolic function is moderately reduced, RV is mildly enlarged  6/19 venous doppler LUE>>acute deep vein thrombosis involving the left axillary vein, surrounding the PICC line. Findings consistent with acute superficial vein thrombosis involving the left cephalic vein.  UGI 6/21 >> small leak from duodenum  CT ABD/Pelvis 6/23 >>interval midline abdominal incision with incomplete skin surgical closure, probable small hematomas within the anterior abdominal wall and the omentum. Nonspecific 4.3 cm air-fluid collection in the RUQ of the abdomen between the duodenal bulb and gallbladder, not containing enteric contrast. No residual leak from the duodenal bulb identified. New small bilateral pleural effusions with dependent L>R lower lobe opacities  Upper GI series 6/30 >>Small puckered stump of a leak at the site of the prior elongatedeak. Findings suggest resolving but persistent extraluminalcollection.  CT abd/pelvis 7/01 >>Anterior midline surgical incision is again noted. Large irregular high density is noted in this area most consistent with enlarging subcutaneous or intramuscular hematoma. Interval placement of pigtail drainage catheter in the area of fluid collection noted on prior exam in the right upper quadrant. Fluid collection is significantly decompressed compared to prior exam. Micro Data:  SARS CoV2 6/13 >> negative RUQ fluid collection 6/24 >> moderate Candida albicans Blood 6/25 >>negative Blood culture 10/29/2019>>> negative     Antimicrobials:   IV Merrem  10/29/2019>>>>>> 11/05/2019  IV Zosyn 10/12/2019>>>> 10/29/2019  IV vancomycin 10/29/2019>>>> 11/03/2019  IV fluconazole 10/25/2019>>>>   Subjective: Patient in bed sleeping but arousable.  Currently receiving transfusion of packed unit of red blood cells.  Patient denies any chest pain, no shortness of breath, no abdominal pain.  States right lower extremity pain improved.  Patient noted to have been in hemorrhagic shock yesterday requiring pressors currently off pressors.  Transfused 2 units packed red blood cells as well as 2 units of FFP on 11/08/2019.    Objective: Vitals:   11/09/19 0700 11/09/19 0800 11/09/19 0819 11/09/19 0838  BP: (!) 144/42 (!) 152/70 (!) 145/55 (!) 150/62  Pulse: 84 92 88 91  Resp: (!) 21 (!) 26 (!) 25 (!) 26  Temp:  97.7 F (36.5 C) 97.7 F (36.5 C) 98.1 F (36.7 C)  TempSrc:  Axillary Oral Oral  SpO2: 96% 98% 99% 96%  Weight:      Height:        Intake/Output Summary (Last 24 hours) at 11/09/2019 1004 Last data filed at 11/09/2019 0835 Gross per 24 hour  Intake 5037.96 ml  Output 2750 ml  Net 2287.96 ml  Filed Weights   11/06/19 0500 11/08/19 1423 11/09/19 0444  Weight: 93.4 kg 91.9 kg 95.8 kg    Examination:  General exam: NAD.  Dry mucous membranes. Respiratory system: Lungs clear to auscultation bilaterally anterior lung fields.  No wheezes, no crackles, no rhonchi.  Cardiovascular system: Regular rate rhythm no murmurs rubs or gallops.  No JVD.  No lower extremity edema.  Gastrointestinal system: Abdomen is mildly distended, soft, positive bowel sounds, bandage on midline wound, right upper quadrant drain noted, nontender to palpation.  No rebound.  No guarding.  Central nervous system: Alert and oriented. No focal neurological deficits. Extremities: Symmetric 5 x 5 power. Skin: No rashes, lesions or ulcers Psychiatry: Judgement and insight appear normal. Mood & affect appropriate.     Data Reviewed: I have personally reviewed following labs  and imaging studies  CBC: Recent Labs  Lab 11/08/19 0353 11/08/19 0353 11/08/19 1334 11/08/19 2225 11/09/19 0013 11/09/19 0444 11/09/19 0540  WBC 11.4*  --  11.3* 11.3*  --  SPECIMEN CONTAMINATED, UNABLE TO PERFORM TEST(S). 11.7*  NEUTROABS  --   --  9.0*  --   --  SPECIMEN CONTAMINATED, UNABLE TO PERFORM TEST(S). 9.5*  HGB 7.9*   < > 5.9* 6.8* 6.9* SPECIMEN CONTAMINATED, UNABLE TO PERFORM TEST(S). 6.7*  HCT 25.1*   < > 18.5* 20.3* 20.0* SPECIMEN CONTAMINATED, UNABLE TO PERFORM TEST(S). 19.9*  MCV 83.4  --  84.1 85.7  --  SPECIMEN CONTAMINATED, UNABLE TO PERFORM TEST(S). 86.1  PLT 387  --  313 209  --  SPECIMEN CONTAMINATED, UNABLE TO PERFORM TEST(S). 234   < > = values in this interval not displayed.    Basic Metabolic Panel: Recent Labs  Lab 11/06/19 0536 11/06/19 0536 11/07/19 0505 11/08/19 0353 11/08/19 1334 11/09/19 0444 11/09/19 0540  NA 137   < > 132* 135 135 SPECIMEN CONTAMINATED, UNABLE TO PERFORM TEST(S). 137  K 4.3   < > 4.3 4.3 4.6 SPECIMEN CONTAMINATED, UNABLE TO PERFORM TEST(S). 4.3  CL 105   < > 103 102 104 SPECIMEN CONTAMINATED, UNABLE TO PERFORM TEST(S). 103  CO2 24   < > 25 27 24  SPECIMEN CONTAMINATED, UNABLE TO PERFORM TEST(S). 26  GLUCOSE 96   < > 86 137* 183* SPECIMEN CONTAMINATED, UNABLE TO PERFORM TEST(S). 161*  BUN 28*   < > 27* 27* 33* SPECIMEN CONTAMINATED, UNABLE TO PERFORM TEST(S). 35*  CREATININE 0.63   < > 0.70 0.74 0.89 SPECIMEN CONTAMINATED, UNABLE TO PERFORM TEST(S). 0.76  CALCIUM 8.0*   < > 8.0* 8.1* 7.5* SPECIMEN CONTAMINATED, UNABLE TO PERFORM TEST(S). 7.9*  MG 2.1  --  2.2 2.1  --  SPECIMEN CONTAMINATED, UNABLE TO PERFORM TEST(S). 2.2  PHOS 2.6  --  3.1 3.8  --  SPECIMEN CONTAMINATED, UNABLE TO PERFORM TEST(S). 4.0   < > = values in this interval not displayed.    GFR: Estimated Creatinine Clearance: 81.2 mL/min (by C-G formula based on SCr of 0.76 mg/dL).  Liver Function Tests: Recent Labs  Lab 11/03/19 0521 11/05/19 0618  11/09/19 0444 11/09/19 0540  AST 48* 64* SPECIMEN CONTAMINATED, UNABLE TO PERFORM TEST(S). 46*  ALT 48* 68* SPECIMEN CONTAMINATED, UNABLE TO PERFORM TEST(S). 56*  ALKPHOS 82 72 SPECIMEN CONTAMINATED, UNABLE TO PERFORM TEST(S). 48  BILITOT 0.7 0.3 SPECIMEN CONTAMINATED, UNABLE TO PERFORM TEST(S). 0.7  PROT 6.2* 6.4* SPECIMEN CONTAMINATED, UNABLE TO PERFORM TEST(S). 5.6*  ALBUMIN 1.4* 1.6* SPECIMEN CONTAMINATED, UNABLE TO PERFORM TEST(S). 2.3*    CBG: Recent Labs  Lab 11/08/19 0604 11/08/19 1112 11/08/19 1945 11/08/19 2355 11/09/19 0603  GLUCAP 133* 176* 215* 177* 156*     Recent Results (from the past 240 hour(s))  C Difficile Quick Screen (NO PCR Reflex)     Status: None   Collection Time: 11/05/19 12:17 PM   Specimen: STOOL  Result Value Ref Range Status   C Diff antigen NEGATIVE NEGATIVE Final   C Diff toxin NEGATIVE NEGATIVE Final   C Diff interpretation NEGATIVE  Final    Comment: Performed at [facility], [place] [address] [place], [place] [address]         Radiology Studies: CT ABDOMEN PELVIS W CONTRAST  Result Date: 11/08/2019 CLINICAL DATA:  Abdominal pain. EXAM: CT ABDOMEN AND PELVIS WITH CONTRAST TECHNIQUE: Multidetector CT imaging of the abdomen and pelvis was performed using the standard protocol following bolus administration of intravenous contrast. CONTRAST:  174mL OMNIPAQUE IOHEXOL 300 MG/ML  SOLN COMPARISON:  October 29, 2019 FINDINGS: Lower chest: There are a few small pulmonary nodules at the lung bases. Atelectasis is noted. There are trace bilateral pleural effusions. The band coronary artery calcifications are noted. Hepatobiliary: The liver is normal. The gallbladder is distended as before. There is dilatation of the common bile duct.There is no biliary ductal dilation. Pancreas: Normal contours without ductal dilatation. No peripancreatic fluid collection. Spleen: Spleen is unremarkable. There is a small collection adjacent to the upper  pole of the spleen which has decreased in size from prior study. Adrenals/Urinary Tract: --Adrenal glands: Unremarkable. --Right kidney/ureter: No hydronephrosis or radiopaque kidney stones. --Left kidney/ureter: No hydronephrosis or radiopaque kidney stones. --Urinary bladder: Unremarkable. Stomach/Bowel: --Stomach/Duodenum: No hiatal hernia or other gastric abnormality. Normal duodenal course and caliber. --Small bowel: Unremarkable. --Colon: There are scattered diverticula without CT evidence for diverticulitis. --Appendix: Not visualized. No right lower quadrant inflammation or free fluid. Vascular/Lymphatic: Atherosclerotic calcification is present within the non-aneurysmal abdominal aorta, without hemodynamically significant stenosis. --No retroperitoneal lymphadenopathy. --No mesenteric lymphadenopathy. --No pelvic or inguinal lymphadenopathy. Reproductive: There are few brachytherapy beads within the prostate gland Other: There is a new large retroperitoneal hematoma measuring approximately 14 x 15 by 15.5 cm. There is a hematocrit level within this retroperitoneal hematoma. There is evidence for active bleeding within the right psoas muscle, new since prior study. There is a percutaneous drain in the right upper quadrant. There is suggestion of a small fistulous connection to the duodenal bulb as was demonstrated on the patient's recent fistulogram. There is a trace amount of free fluid in the upper abdomen. Musculoskeletal. There is a persistent hematoma in the right rectus muscle which has decreased in size since the prior study. There is no acute displaced fracture. IMPRESSION: 1. New large right-sided retroperitoneal hematoma with evidence for active bleeding within the right psoas muscle. 2. Persistent but decreased size of the right rectus muscle hematoma. 3. Distended gallbladder with dilatation of the common bile duct. Correlation with laboratory studies is recommended. If there is concern for acute  cholecystitis, follow-up with ultrasound is recommended. 4. Stable positioning of the percutaneous drain in the right upper quadrant with a probable fistulous connection to the duodenal bulb. Aortic Atherosclerosis (ICD10-I70.0). These results were called by telephone at the time of interpretation on 11/08/2019 at 3:26 pm to provider [facility] , who verbally acknowledged these results. Electronically Signed   By: Constance Holster M.D.   On: 11/08/2019 15:27        Scheduled Meds: . sodium chloride   Intravenous Once  .  sodium chloride   Intravenous Once  . chlorhexidine  15 mL Mouth Rinse BID  . Chlorhexidine Gluconate Cloth  6 each Topical Daily  . furosemide  20 mg Intravenous Once  . furosemide  20 mg Intravenous Once  . insulin aspart  0-20 Units Subcutaneous Q6H  . insulin detemir  10 Units Subcutaneous BID  . lidocaine  2 patch Transdermal Q24H  . mouth rinse  15 mL Mouth Rinse q12n4p  . metoprolol tartrate  5 mg Intravenous Q4H  . pantoprazole (PROTONIX) IV  40 mg Intravenous Q12H  . simethicone  160 mg Oral QID  . sodium chloride flush  10-40 mL Intracatheter Q12H  . sodium chloride flush  5 mL Intracatheter Q8H  . sucralfate  1 g Oral BID   Continuous Infusions: . sodium chloride Stopped (11/05/19 1544)  . sodium chloride 10 mL/hr at 11/07/19 1300  . acetaminophen Stopped (11/09/19 0537)  . fluconazole (DIFLUCAN) IV Stopped (11/08/19 2337)  . methocarbamol (ROBAXIN) IV Stopped (11/09/19 1594)  . norepinephrine (LEVOPHED) Adult infusion Stopped (11/08/19 1910)  . TPN ADULT (ION) 95 mL/hr at 11/09/19 0835  . TPN ADULT (ION)       LOS: 28 days    Time spent: 45 minutes    Irine Seal, MD [facility]   To contact the attending provider between 7A-7P or the covering provider during after hours 7P-7A, please log into the web site [email or web address] and access using universal [place] password for that web site. If you do not have the password, please  call the hospital operator.  11/09/2019, 10:04 AM

## 2019-11-09 NOTE — TOC Progression Note (Signed)
Transition of Care [facility]) - Progression Note    Patient Details  Name: Matthew Schwartz MRN: [id number] Date of Birth: 22-Jun-1932  Transition of [facility]) CM/SW Contact  Joaquin Courts, RN Phone Number: 11/09/2019, 10:15 AM  Clinical Narrative:    CM spoke with patient's MD and MD covering for UR today regarding patient's condition and eligibility for LTAC.  MD's feel at this time patient is too acute and not medically ready for LTAC.  CM spoke with LTAC rep and requested they pause with obtaining insurance auth until patient's trajectory becomes more clear.  TOC will continue to follow and coordinate care as appropriate.     Expected Discharge Plan: Long Term Acute Care (LTAC) Barriers to Discharge: Continued Medical Work up  Expected Discharge Plan and Services Expected Discharge Plan: [place] (LTAC)   Discharge Planning Services: CM Consult   Living arrangements for the past 2 months: Single Family Home                                       Social Determinants of Health (SDOH) Interventions    Readmission Risk Interventions Readmission Risk Prevention Plan 11/09/2019  Transportation Screening Complete  Medication Review Press photographer) Complete  HRI or [place] Complete  SW Recovery Care/Counseling Consult Complete  [place] Not Applicable  Some recent data might be hidden

## 2019-11-09 NOTE — Progress Notes (Signed)
Nutrition Follow-up  DOCUMENTATION CODES:   Not applicable  INTERVENTION:  - continue TPN regimen per Pharmacist.   NUTRITION DIAGNOSIS:   Inadequate oral intake related to inability to eat as evidenced by NPO status. -ongoing  GOAL:   Patient will meet greater than or equal to 90% of their needs -met with TPN regimen  MONITOR:   Diet advancement, Labs, Weight trends, Other (Comment) (TPN regimen)  ASSESSMENT:   Pt admitted with obstipation. PMH includes prostate cancer s/p radiation, HTN, HLD, GERD, T2DM.  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 Patient sleeping at this time with no family or visitors present. He has triple lumen PICC in L basilic from 2/82 and is receiving custom TPN @ 95 ml/hr. This regimen is providing 2262 kcal, 121 grams protein.   Surgeon note from this AM states concern for fistula or leak and plan to continue NPO status and TPN. Note also states patient and family continuing to discuss [place].     Labs reviewed; CBG: 156 mg/dl, BUN: 35 mg/dl, Ca: 7.9 mg/dl, LFTs slightly elevated, triglycerides: 73 mg/dl today. Medications reviewed; 20 mg IV lasix x1 dose 7/11 and x3 doses 7/12, sliding scale novolog, 10 units levemir BID, 40 mg IV protonix BID.     Diet Order:   Diet Order            Diet NPO time specified Except for: Ice Chips  Diet effective now                 EDUCATION NEEDS:   Not appropriate for education at this time  Skin:  Skin Assessment: Skin Integrity Issues: Skin  Integrity Issues:: Incisions Incisions: abdomen (6/14)  Last BM:  7/4  Height:   Ht Readings from Last 1 Encounters:  11/08/19 _0  (1.803 m)    Weight:   Wt Readings from Last 1 Encounters:  11/09/19 95.8 kg    Ideal Body Weight:  75.45 kg  BMI:  Body mass index is 29.46 kg/m.  Estimated Nutritional Needs:   Kcal:  2200-2400  Protein:  115-125 grams  Fluid:  >2L/d     Jarome Matin, MS, RD, LDN, CNSC Inpatient Clinical Dietitian RD pager # available in [facility]  After hours/weekend pager # available in [facility]

## 2019-11-09 NOTE — Progress Notes (Signed)
Daily Progress Note   Patient Name: Matthew Schwartz       Date: 11/09/2019 DOB: 1932/07/06  Age: 86 y.o. MRN#: [id number] Attending Physician: Eugenie Filler, MD Primary Care Physician: [name], MD Admit Date: 10/12/2019  Reason for Consultation/Follow-up: Establishing goals of care  Subjective: I saw and examined Matthew Schwartz today and spoke with [facility] MD. no family currently at the bedside.  Chart reviewed.  Events over the course of the past 24 hours also noted.      Length of Stay: 28  Current Medications: Scheduled Meds:  . sodium chloride   Intravenous Once  . sodium chloride   Intravenous Once  . chlorhexidine  15 mL Mouth Rinse BID  . Chlorhexidine Gluconate Cloth  6 each Topical Daily  . furosemide  20 mg Intravenous Once  . insulin aspart  0-20 Units Subcutaneous Q6H  . insulin detemir  10 Units Subcutaneous BID  . lidocaine  2 patch Transdermal Q24H  . mouth rinse  15 mL Mouth Rinse q12n4p  . metoprolol tartrate  5 mg Intravenous Q4H  . pantoprazole (PROTONIX) IV  40 mg Intravenous Q12H  . simethicone  160 mg Oral QID  . sodium chloride flush  10-40 mL Intracatheter Q12H  . sodium chloride flush  5 mL Intracatheter Q8H  . sucralfate  1 g Oral BID    Continuous Infusions: . sodium chloride Stopped (11/05/19 1544)  . sodium chloride 10 mL/hr at 11/07/19 1300  . acetaminophen Stopped (11/09/19 0537)  . acetaminophen    . fluconazole (DIFLUCAN) IV Stopped (11/08/19 2337)  . methocarbamol (ROBAXIN) IV Stopped (11/09/19 7357)  . norepinephrine (LEVOPHED) Adult infusion Stopped (11/08/19 1910)  . TPN ADULT (ION) 95 mL/hr at 11/09/19 0835  . TPN ADULT (ION)      PRN Meds: sodium chloride, acetaminophen, acetaminophen, acetaminophen, acetaminophen, bisacodyl, fentaNYL  (SUBLIMAZE) injection, hydrALAZINE, ipratropium-albuterol, lip balm, LORazepam, melatonin, methocarbamol (ROBAXIN) IV, ondansetron (ZOFRAN) IV, prochlorperazine, silver nitrate applicators, sodium chloride flush  Physical Exam         Elderly male resting in bed Has some dependent edema  Regular work of breathing S1-S2 Abdomen not distended Appears weak and tired currently.  Appears with generalized weakness.  Vital Signs: BP 125/66   Pulse 80   Temp 98.6 F (37 C) (Axillary)   Resp 18  Ht 5\' 11"  (1.803 m)   Wt 95.8 kg   SpO2 94%   BMI 29.46 kg/m  SpO2: SpO2: 94 % O2 Device: O2 Device: Room Air O2 Flow Rate: O2 Flow Rate (L/min): 2 L/min  Intake/output summary:   Intake/Output Summary (Last 24 hours) at 11/09/2019 1125 Last data filed at 11/09/2019 1058 Gross per 24 hour  Intake 5387.13 ml  Output 2750 ml  Net 2637.13 ml   LBM: Last BM Date: 10/29/19 Baseline Weight: Weight: 96.4 kg Most recent weight: Weight: 95.8 kg       Palliative Assessment/Data:    Flowsheet Rows     Most Recent Value  Intake Tab  Referral Department Hospitalist  Unit at Time of Referral ICU  Date Notified 10/23/19  Palliative Care Type New Palliative care  Reason for referral Clarify Goals of Care  Date of Admission 10/12/19  Date first seen by Palliative Care 10/24/19  # of days Palliative referral response time 1 Day(s)  # of days IP prior to Palliative referral 11  Clinical Assessment  Psychosocial & Spiritual Assessment  Palliative Care Outcomes     Palliative performance scale 30% Patient Active Problem List   Diagnosis Date Noted  . Nontraumatic retroperitoneal hematoma: 11/08/2019 11/09/2019  . Hemorrhagic shock (HCC):11/08/2019 11/09/2019  . Acute respiratory failure with hypoxemia ([place])   . Intraabdominal fluid collection   . Anemia of chronic disease   . Hypotension   . Gastric ulcer   . Hypokalemia   . NSVT (nonsustained ventricular tachycardia) ([place])   . Diarrhea    . Severe sepsis with septic shock ([place])   . NSTEMI (non-ST elevated myocardial infarction) ([place])   . Acute deep vein thrombosis (DVT) of left upper extremity ([place])   . Acute systolic CHF (congestive heart failure) ([place])   . Peritonitis ([place])   . Epigastric pain   . Palliative care by specialist   . Goals of care, counseling/discussion   . General weakness   . Acute abdominal pain   . Bilateral atelectasis 10/17/2019  . Acute respiratory insufficiency   . Elevated troponin level   . Acute respiratory failure with hypoxia ([place])   . PVCs (premature ventricular contractions)   . Obstipation 10/12/2019  . HLD (hyperlipidemia) 10/12/2019  . Diabetes mellitus type 2, uncontrolled, with complications ([place]) 19/50/9326  . Perforated bowel ([place]) 10/12/2019  . Perforated duodenal ulcer ([place]) 10/12/2019  . Anemia 09/07/2016  . Coronary artery disease due to lipid rich plaque 02/16/2015  . Hyperlipidemia 02/16/2015  . Malignant neoplasm of prostate ([place]) 02/18/2014  . HTN (hypertension) 06/14/2013  . Dyslipidemia 06/14/2013  . GERD (gastroesophageal reflux disease) 06/14/2013    Palliative Care Assessment & Plan   Patient Profile:    Assessment: 86 year old gentleman with coronary artery disease status post percutaneous intervention in the year 2000, hypertension, dyslipidemia, diabetes who was admitted with perforated duodenal ulcer and peritonitis. Cardiology has been following for NSVT, intermittent left bundle branch block and anterolateral T wave inversions as well as elevated troponin, at some point is supposed to undergo cardiac catheterization after acute issues resolved. General surgery also following Patient on 7-11 was found to have abdominal pain, right thigh pain.  Emergent CT imaging showed spontaneous large retroperitoneal hematoma.  Recommendations/Plan:  Overall, Matthew Schwartz remains in a tenuous critical condition with a guarded prognosis.  Patient currently n.p.o., is on  TPN.  He is being followed by critical care, remains admitted to hospital medicine service.  Surgery is also following.  Patient's anticoagulation  has been discontinued.  He was given packed red blood cell transfusion as well as FFP.   Ongoing goals of care discussions, CODE STATUS discussions with the patient directly as well as with his family which consists of wife and 2 daughters.  Palliative medicine service following along for additional support.  Patient and family have had extensive discussions with both critical care as well as hospital medicine service earlier today.       No family currently present at the bedside.  Call placed unable to reach daughter by phone presently. Will allow for some  time for space and reflection today. PMT to follow-up on 7-13.   Continue current pain and non pain symptom management.   From a palliative perspective, we recommend consideration for DNR DNI and aggressive symptom management, patient will also possibly benefit from hospice services in the near future, depending on his overall disease trajectory, PMT to follow along.    Goals of Care and Additional Recommendations:  Limitations on Scope of Treatment: Full Scope Treatment  Code Status:    Code Status Orders  (From admission, onward)         Start     Ordered   10/12/19 1208  Full code  Continuous        10/12/19 1212        Code Status History    Date Active Date Inactive Code Status Order ID Comments User Context   02/12/2014 1127 02/13/2014 0336 Full Code [id number]  Arne Cleveland, MD [place] Planning Activity    Advance Directive Documentation     Most Recent Value  Type of Advance Directive Healthcare Power of Attorney, Living will  Pre-existing out of facility DNR order (yellow form or pink MOST form) --  "MOST" Form in Place? --       Prognosis:   Unable to determine Guarded, continue to monitor hospital course and overall disease trajectory.  Discharge  Planning:  To Be Determined   Care plan was discussed with patient and TR[facility] MD.  Thank you for allowing the Palliative Medicine Team to assist in the care of this patient.   Total Time 25 Prolonged Time Billed  no    Greater than 50%  of this time was spent counseling and coordinating care related to the above assessment and plan.  Loistine Chance, MD  Please contact Palliative Medicine Team phone at [phone] for questions and concerns.

## 2019-11-09 NOTE — Progress Notes (Signed)
NAME:  Matthew Schwartz, MRN:  [id number], DOB:  01-14-1933, LOS: 43 ADMISSION DATE:  10/12/2019, CONSULTATION DATE:  6/14 REFERRING MD:  [name], CHIEF COMPLAINT:  Abdominal pain   Brief History   86 y/o male, former smoker, presented with nausea/vomiting, abdominal pain and constipation x 4 to 5 days.  Seen twice prior to current admit for the same with imaging studies unremarkable for cause.  On return 6/14, seen by GI and found to have concern for acute abdomen and taken for exploratory laparotomy.  Found to have perforated duodenal ulcer s/p omental patch repair.  Remained on vent post op.  PCCM consulted for critical care management. Extubated on 6/17, had IR drain of abdominal fluid on 6/23. PCCM signed off on 6/25.  PCCM asked to see again for dyspnea on 7/1. He was transferred to the medical floor, anticipation was to transfer to select specialty for further long-term acute care Noted to be hypotensive today-investigation so far significant for low hematocrit Large retroperitoneal hematoma  Past Medical History  Prostate cancer, HTN, GERD, HLD, DM  Significant Hospital Events   6/14 Admit, exploratory laparotomy, omental patch repair of duodenal ulcer 6/16 Off vasopressors, less drainage from abd drain / clearing  6/17 extubated, weak cough, added high flow oxygen 6/18 start heparin gtt 6/20 d/c amiodarone 6/23 new fluid collection on CT abd > Drained by IR 6/24 To [place] 6/25 PCCM sign off 7/01 back to ICU with fever, tachypnea >> PCCM asked to reassess 7/2 comfortable this morning 7/11 large retroperitoneal hematoma-transferred back to the ICU Consults:  Gastroenterology General surgery Cardiology  Procedures:  Rt IJ CVL 6/14 >> 6/15 Rt radial a line 6/14 >> 6/17 ETT 6/14 >> 6/17 LUE PICC 6/16 >>   Significant Diagnostic Tests:   CT angio abd/pelvis 6/13 >> mild atherosclerosis, no significant arterial stenosis of mesenteric vasculature, no findings of bowel ischemia,  colonic diverticulosis w/o diverticulitis, b/l renal cysts, 12 mm lesion upper pole Rt kidney  ECHO 6/15 >> poor image quality, appears to be regional wall motion variability but confident wall motion analysis is impossible even after contrast administration, LVEF ~ 93-81%, Grade I diastolic dysfunction, RV systolic function is moderately reduced, RV is mildly enlarged  6/19 venous doppler LUE >> acute deep vein thrombosis involving the left axillary vein, surrounding the PICC line. Findings consistent with acute superficial vein thrombosis involving the left cephalic vein.  UGI 6/21 >> small leak from duodenum  CT ABD/Pelvis 6/23 >> interval midline abdominal incision with incomplete skin surgical closure, probable small hematomas within the anterior abdominal wall and the omentum. Nonspecific 4.3 cm air-fluid collection in the RUQ of the abdomen between the duodenal bulb and gallbladder, not containing enteric contrast. No residual leak from the duodenal bulb identified.  New small bilateral pleural effusions with dependent L>R lower lobe opacities  Upper GI series 6/30 >> Small puckered stump of a leak at the site of the prior elongated eak. Findings suggest resolving but persistent extraluminal collection.  CT abd/pelvis 7/01 >> Anterior midline surgical incision is again noted. Large irregular high density is noted in this area most consistent with enlarging subcutaneous or intramuscular hematoma.  Interval placement of pigtail drainage catheter in the area of fluid collection noted on prior exam in the right upper quadrant. Fluid collection is significantly decompressed compared to prior exam.  Micro Data:  SARS CoV2 6/13 >> negative RUQ fluid collection 6/24 >> moderate Candida albicans Blood 6/25 >>   Antimicrobials:  Zosyn 6/14 >> 7/01  Fluconazole 6/26 >> Meropenem 7/01 -7/8 Vancomycin 7/01 -7/5  Interim history/subjective:  Feels a little bit better today Denies any abdominal  pain or discomfort Leg pain is better  Objective   Blood pressure (!) 150/62, pulse 91, temperature 98.1 F (36.7 C), temperature source Oral, resp. rate (!) 26, height 5\' 11"  (1.803 m), weight 95.8 kg, SpO2 96 %.    FiO2 (%):  [30 %] 30 %   Intake/Output Summary (Last 24 hours) at 11/09/2019 1005 Last data filed at 11/09/2019 9675 Gross per 24 hour  Intake 5037.96 ml  Output 2750 ml  Net 2287.96 ml   Filed Weights   11/06/19 0500 11/08/19 1423 11/09/19 0444  Weight: 93.4 kg 91.9 kg 95.8 kg    Examination: General: Comfortable, goes back to sleep easily even during conversation HENT: Moist oral mucosa PULM: Clear to auscultation CV: S1-S2 appreciated GI: Bowel sounds appreciated, drain in place MSK: normal bulk and tone Neuro: Awake and alert, following commands, no focality  Resolved Hospital Problem list   Septic shock, Acute respiratory failure with hypoxia  Assessment & Plan:  Peritonitis in setting of perforated duodenal ulcer s/p omental patch repair NPO Ice chips Upper GI series on 11/03/2018-no leak identified  Retroperitoneal hematoma Unfortunately this developed spontaneously on heparin at therapeutic doses We cannot continue heparin He should not be restarted on heparin  RUQ fluid collection s/p IR drain with candida albicans in culture Fever 7/1 Abx per primary-recently completed meropenem -On fluconazole IV  Acute systolic heart failure in setting of sepsis > improved Demand ischemia> improved History of CAD, hypertension, hyperlipidemia Tele IV metoprolol clonidine -Medications that may affect his blood pressure being held at present for hypotension  L arm DVT related to PICC Heparin was resumed on 7/3 Hemoglobin was stable up until 711 when it dropped acutely Found to have a retroperitoneal bleed Heparin stopped  DM2 Levemir/SSI accuchecks  Anemia of critical illness and chronic disease Acute blood loss anemia Monitor for  bleeding Transfuse PRBC for Hgb < 7 gm/dL  Moderate Malnutrition in setting of critical illness TPN  Acute blood loss anemia with hemodynamic compromise Has a retroperitoneal bleed Discussed with Dr. Grandville Silos  Extensive discussion with daughter, spouse and patient. Further discussions with daughter and Dr. Grandville Silos at bedside with patient Goals of care discussions Decision regarding DO NOT RESUSCITATE All patient really wants to do now is to eat  Discussions regarding hospice started Family needs more time to discuss things amongst themselves  Best practice:   PER TRH   Labs   CBC: Recent Labs  Lab 11/08/19 0353 11/08/19 0353 11/08/19 1334 11/08/19 2225 11/09/19 0013 11/09/19 0444 11/09/19 0540  WBC 11.4*  --  11.3* 11.3*  --  SPECIMEN CONTAMINATED, UNABLE TO PERFORM TEST(S). 11.7*  NEUTROABS  --   --  9.0*  --   --  SPECIMEN CONTAMINATED, UNABLE TO PERFORM TEST(S). 9.5*  HGB 7.9*   < > 5.9* 6.8* 6.9* SPECIMEN CONTAMINATED, UNABLE TO PERFORM TEST(S). 6.7*  HCT 25.1*   < > 18.5* 20.3* 20.0* SPECIMEN CONTAMINATED, UNABLE TO PERFORM TEST(S). 19.9*  MCV 83.4  --  84.1 85.7  --  SPECIMEN CONTAMINATED, UNABLE TO PERFORM TEST(S). 86.1  PLT 387  --  313 209  --  SPECIMEN CONTAMINATED, UNABLE TO PERFORM TEST(S). 234   < > = values in this interval not displayed.    Basic Metabolic Panel: Recent Labs  Lab 11/06/19 0536 11/06/19 0536 11/07/19 0505 11/08/19 0353 11/08/19 1334 11/09/19 0444 11/09/19  0540  NA 137   < > 132* 135 135 SPECIMEN CONTAMINATED, UNABLE TO PERFORM TEST(S). 137  K 4.3   < > 4.3 4.3 4.6 SPECIMEN CONTAMINATED, UNABLE TO PERFORM TEST(S). 4.3  CL 105   < > 103 102 104 SPECIMEN CONTAMINATED, UNABLE TO PERFORM TEST(S). 103  CO2 24   < > 25 27 24  SPECIMEN CONTAMINATED, UNABLE TO PERFORM TEST(S). 26  GLUCOSE 96   < > 86 137* 183* SPECIMEN CONTAMINATED, UNABLE TO PERFORM TEST(S). 161*  BUN 28*   < > 27* 27* 33* SPECIMEN CONTAMINATED, UNABLE TO PERFORM  TEST(S). 35*  CREATININE 0.63   < > 0.70 0.74 0.89 SPECIMEN CONTAMINATED, UNABLE TO PERFORM TEST(S). 0.76  CALCIUM 8.0*   < > 8.0* 8.1* 7.5* SPECIMEN CONTAMINATED, UNABLE TO PERFORM TEST(S). 7.9*  MG 2.1  --  2.2 2.1  --  SPECIMEN CONTAMINATED, UNABLE TO PERFORM TEST(S). 2.2  PHOS 2.6  --  3.1 3.8  --  SPECIMEN CONTAMINATED, UNABLE TO PERFORM TEST(S). 4.0   < > = values in this interval not displayed.   GFR: Estimated Creatinine Clearance: 81.2 mL/min (by C-G formula based on SCr of 0.76 mg/dL). Recent Labs  Lab 11/08/19 1334 11/08/19 2225 11/09/19 0444 11/09/19 0540  WBC 11.3* 11.3* SPECIMEN CONTAMINATED, UNABLE TO PERFORM TEST(S). 11.7*    Liver Function Tests: Recent Labs  Lab 11/03/19 0521 11/05/19 0618 11/09/19 0444 11/09/19 0540  AST 48* 64* SPECIMEN CONTAMINATED, UNABLE TO PERFORM TEST(S). 46*  ALT 48* 68* SPECIMEN CONTAMINATED, UNABLE TO PERFORM TEST(S). 56*  ALKPHOS 82 72 SPECIMEN CONTAMINATED, UNABLE TO PERFORM TEST(S). 48  BILITOT 0.7 0.3 SPECIMEN CONTAMINATED, UNABLE TO PERFORM TEST(S). 0.7  PROT 6.2* 6.4* SPECIMEN CONTAMINATED, UNABLE TO PERFORM TEST(S). 5.6*  ALBUMIN 1.4* 1.6* SPECIMEN CONTAMINATED, UNABLE TO PERFORM TEST(S). 2.3*   No results for input(s): LIPASE, AMYLASE in the last 168 hours. No results for input(s): AMMONIA in the last 168 hours.  ABG    Component Value Date/Time   PHART 7.481 (H) 10/29/2019 2230   PCO2ART 33.9 10/29/2019 2230   PO2ART 156 (H) 10/29/2019 2230   HCO3 24.8 10/29/2019 2230   TCO2 22 10/12/2019 1717   ACIDBASEDEF 2.6 (H) 10/13/2019 0311   O2SAT 99.6 10/29/2019 2230     Coagulation Profile: Recent Labs  Lab 11/08/19 1525  INR 1.5*    Cardiac Enzymes: No results for input(s): CKTOTAL, CKMB, CKMBINDEX, TROPONINI in the last 168 hours.  HbA1C: Hgb A1c MFr Bld  Date/Time Value Ref Range Status  10/12/2019 02:30 PM 6.6 (H) 4.8 - 5.6 % Final    Comment:    (NOTE) Pre diabetes:          5.7%-6.4%  Diabetes:               >6.4%  Glycemic control for   <7.0% adults with diabetes     CBG: Recent Labs  Lab 11/08/19 0604 11/08/19 1112 11/08/19 1945 11/08/19 2355 11/09/19 0603  GLUCAP 133* 176* 215* 177* 156*   Sherrilyn Rist, MD [place] PCCM Pager: [phone]

## 2019-11-09 NOTE — TOC Progression Note (Signed)
Transition of Care [facility]) - Progression Note    Patient Details  Name: Jedadiah Abdallah MRN: [id number] Date of Birth: [birth year]  [facility]) CM/SW Contact  Joaquin Courts, RN Phone Number: 11/09/2019, 9:39 AM  Clinical Narrative:  CM received call from LTAC rep stating insurance has requested a peer to peer to be comleted today by 1230pm.  MD notified of this information and provided with number to call [phone] option 5).  Patient's reference number [id number].      Expected Discharge Plan: Long Term Acute Care (LTAC) Barriers to Discharge: Continued Medical Work up  Expected Discharge Plan and Services Expected Discharge Plan: [place] (LTAC)   Discharge Planning Services: CM Consult   Living arrangements for the past 2 months: Single Family Home                                       Social Determinants of Health (SDOH) Interventions    Readmission Risk Interventions Readmission Risk Prevention Plan 11/09/2019  Transportation Screening Complete  Medication Review Press photographer) Complete  HRI or [place] Complete  SW Recovery Care/Counseling Consult Complete  [place] Not Applicable  Some recent data might be hidden

## 2019-11-09 NOTE — Progress Notes (Signed)
PHARMACY - TOTAL PARENTERAL NUTRITION CONSULT NOTE   Indication: Prolonged ileus  Patient Measurements: Height: '5\' 11"'  (180.3 cm) Weight: 95.8 kg (211 lb 3.2 oz) IBW/kg (Calculated) : 75.3 TPN AdjBW (KG): 91.9 Body mass index is 29.46 kg/m. Usual Weight: 90.7 kg   Assessment:  Pharmacy is consulted to start TPN in 86 yo male with prolonged ileus. Pt underwent exploratory laparotomy on 6/14 which revealed perforated duodenal ulcer.   Glucose / Insulin: hx of DM - on metformin PTA.  - CBGs improved to > 100 with reducing insulin to 50 units/TPN bag yesterday. Also remains on rSSI q6h (CBG goal range 100-150) - 3 units of SSI required in past 24h - Levemir 10 units BID per CCM started 6/18  Electrolytes: K+ 4.3 (target >/= 4.0) . Magnesium 2.2 (target >/= 2.0). All other lytes, including Corrected Ca, WNL.   Renal: BUN continues to increase, SCr wnl, unchanged  LFTs / TGs: AST/ALT 46/56, decreasing; T.bili, Alk Phos WNL. TG 65 (7/5), 73 (7/12) Prealbumin / albumin: Prealbumin 8.3 (7/5), 9.6 (6/28), 9.0 (6/21), 10.9 (7/12) Albumin 1.6 (7/8), 2.3 (7/12 Intake / Output; MIVF: NS at [facility]; NG removed; no I/O charted last 24h (was off ICU) GI Imaging:  - 6/21 Upper GI series: small leak from superior margin of duodenal bulb. - 6/23 CT abdomen: 4.3 cm air-fluid collection in the right upper quadrant of the abdomen between the duodenal bulb and gallbladder,not containing enteric contrast. No residual leak from the duodenal bulb identified. - 7/1 Abdominal CT: large irregular high density noted most consistent with enlarging subcutaneous or IM hematoma - 7/6 Upper GI showed no leak.  Will need repeat sinogram without fistula to start oral feeds to maximize chance of this healing.   Surgeries / Procedures:  - 6/14 Exploratory laparotomy which revealed perforated duodenal ulcer - 6/24 RUQ drain placed > 10 ml > cultured - 6/25 removal R abd JP drain  - 7/2 R abd drain placed  Central access:   PICC Line 6/15 TPN start date: 6/15  Nutritional Goals: per RD recommendation on 7/6: Kcal:  2200-2400 Protein:  115-125 grams Fluid:  >2L/d  Goal TPN rate is 95 mL/hr (provides 121 g of protein and 2262 kcals per day)  Current Nutrition:  NPO   Plan:   Continue TPN at 95 mL/hr at 1800, goal rate   Electrolytes in TPN:   85 mEq/L of Na, 85 mEq/L of K , 4 mEq/L of Ca, 12 mEq/L of Magnesium, and 15 mmol/L of Phos. Cl:Ac ratio max Acetate  Standard MVI and trace elements to TPN  Continue resistant q6h SSI, levemir and adjust as needed   Continue 50 units of regular insulin/day in TPN as yesterday. Aiming to prevent hypoglycemia.    Continue MIVF at [facility] mL/hr   Monitor TPN labs on Mon/Thurs  Minda Ditto PharmD Pager [phone] 11/09/2019, 9:25 AM

## 2019-11-09 NOTE — Progress Notes (Signed)
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 Authorizing Provider  alfuzosin (UROXATRAL) 10 MG 24 hr tablet Take 10 mg by mouth daily. 09/07/19  Yes [provider]  amLODipine (NORVASC) 5 MG tablet Take 5 mg by mouth daily. 07/13/19  Yes [provider]  aspirin 81 MG tablet Take 81 mg by mouth every morning.    Yes [provider]  atorvastatin (LIPITOR) 40 MG tablet Take 40 mg by mouth at bedtime.    Yes [provider]  dicyclomine (BENTYL) 20 MG tablet Take 1 tablet (20 mg total) by mouth 2 (two) times daily. 10/11/19  Yes Nuala Alpha A, PA-C  famotidine (PEPCID) 20 MG tablet Take 20 mg by mouth 2 (two) times daily.   Yes [provider]  hydrocortisone valerate cream (WESTCORT) 0.2 % Apply 1 application topically 2 (two) times daily.  09/20/19  Yes [provider]  isosorbide mononitrate (IMDUR) 60 MG 24 hr tablet Take 60 mg by mouth every evening.   Yes [provider]  lactulose (CHRONULAC) 10 GM/15ML solution Take 10 g by mouth as needed for mild constipation.   Yes [provider]  metFORMIN (GLUCOPHAGE) 500 MG tablet Take 500 mg by mouth 2 (two) times daily. 09/20/19  Yes [provider]  metoprolol succinate (TOPROL-XL) 50 MG 24 hr tablet Take 50 mg by mouth 2 (two) times daily. 04/07/14  Yes [provider]  potassium chloride (KLOR-CON) 10 MEQ tablet Take 2 tablets (20 mEq total) by mouth daily for 3 days. 10/11/19 10/14/19 Yes Nuala Alpha A, PA-C    PROCTOSOL HC 2.5 % rectal cream Place 1 application rectally daily as needed for hemorrhoids or itching.  11/06/13  Yes [provider]  ramipril (ALTACE) 10 MG capsule Take 10 mg by mouth 2 (two) times daily.    Yes [provider]  RESTASIS 0.05 % ophthalmic emulsion Place 1 drop into both eyes 2 (two) times daily.  09/09/19  Yes [provider]  senna-docusate (SENOKOT-S) 8.6-50 MG tablet Take 1 tablet by mouth daily. 10/09/19  Yes Lucrezia Starch, MD  [facility] injection Inject as directed once. 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 EXAM: ABDOMEN - 1 VIEW COMPARISON:  None. FINDINGS: The bowel gas pattern is normal. Pigtail drainage catheter is noted in right upper quadrant. No radio-opaque calculi or other significant radiographic abnormality are seen. IMPRESSION: No evidence of bowel obstruction or ileus. Electronically Signed   By: Marijo Conception M.D.   On: 11/06/2019 12:00   CT ABDOMEN PELVIS W CONTRAST  Result Date: 11/08/2019 CLINICAL DATA:  Abdominal pain. EXAM: CT ABDOMEN AND PELVIS WITH CONTRAST TECHNIQUE: Multidetector CT imaging of the abdomen and pelvis was performed using the standard protocol following bolus administration of intravenous contrast. CONTRAST:  171mL OMNIPAQUE IOHEXOL 300 MG/ML  SOLN COMPARISON:  October 29, 2019 FINDINGS: Lower chest: There are a few small pulmonary nodules at the lung bases. Atelectasis is noted. There are trace bilateral pleural effusions. The band coronary artery calcifications are noted. Hepatobiliary:  The liver is normal. The gallbladder is distended as before. There is dilatation of the common bile duct.There is no biliary ductal dilation. Pancreas: Normal contours without ductal dilatation. No peripancreatic fluid collection. Spleen: Spleen is unremarkable. There is a small collection adjacent to the upper pole of the spleen which has decreased in size from prior study. Adrenals/Urinary Tract: --Adrenal glands: Unremarkable. --Right kidney/ureter: No hydronephrosis or radiopaque kidney stones. --Left kidney/ureter: No hydronephrosis or radiopaque kidney stones. --Urinary bladder: Unremarkable. Stomach/Bowel: --Stomach/Duodenum: No hiatal hernia or other gastric abnormality. Normal duodenal course and caliber. --Small bowel: Unremarkable. --Colon: There are scattered diverticula without CT evidence for diverticulitis. --Appendix: Not visualized. No right lower quadrant inflammation or free fluid. Vascular/Lymphatic: Atherosclerotic calcification is present within the non-aneurysmal abdominal aorta, without hemodynamically significant stenosis. --No retroperitoneal lymphadenopathy. --No mesenteric lymphadenopathy. --No pelvic or inguinal lymphadenopathy. Reproductive: There are few brachytherapy beads within the prostate gland Other: There is a new large retroperitoneal hematoma measuring approximately 14 x 15 by 15.5 cm. There is a hematocrit level within this retroperitoneal hematoma. There is evidence for active bleeding within the right psoas muscle, new since prior study. There is a percutaneous drain in the right upper quadrant. There is suggestion of a small fistulous connection to the duodenal bulb as was demonstrated on the patient's recent fistulogram. There is a trace amount of free fluid in the upper abdomen. Musculoskeletal. There is a persistent hematoma in the right rectus muscle which has decreased in size since the prior study. There is no acute displaced fracture. IMPRESSION: 1. New large  right-sided retroperitoneal hematoma with evidence for active bleeding within the right psoas muscle. 2. Persistent but decreased size of the right rectus muscle hematoma. 3. Distended gallbladder with dilatation of the common bile duct. Correlation with laboratory studies is recommended. If there is concern for acute cholecystitis, follow-up with ultrasound is recommended. 4. Stable positioning of the percutaneous drain in the right upper quadrant with a probable fistulous connection to the duodenal bulb. Aortic Atherosclerosis (ICD10-I70.0). These results were called by telephone at the time of interpretation on 11/08/2019 at 3:26 pm to provider [facility] , who verbally acknowledged these results. Electronically Signed   By: Constance Holster M.D.   On: 11/08/2019 15:27   DG Sinus/Fist Tube Chk-Non GI  Result Date: 11/06/2019 INDICATION: Status post drainage of abdominal abscess. EXAM: Injection of drainage catheter in right upper quadrant. MEDICATIONS: The patient is currently admitted to the hospital and receiving intravenous antibiotics. The antibiotics were administered within an appropriate time frame prior to the initiation of the procedure. ANESTHESIA/SEDATION: None. COMPLICATIONS: None immediate. PROCEDURE: Under fluoroscopy, contrast was injected through pre-existing catheter in the right upper quadrant. This demonstrates filling of fistula extending into proximal duodenum with filling of the duodenum indicating patency. IMPRESSION: Upon injection of pigtail drainage catheter in right upper quadrant, filling of proximal duodenum is noted consistent with patent fistula or connection with drained abscess. Electronically Signed   By: Marijo Conception M.D.   On: 11/06/2019 15:58  Labs:  CBC: Recent Labs    11/08/19 1334 11/08/19 1334 11/08/19 2225 11/09/19 0013 11/09/19 0444 11/09/19 0540  WBC 11.3*  --  11.3*  --  SPECIMEN CONTAMINATED, UNABLE TO PERFORM TEST(S). 11.7*  HGB 5.9*   <  > 6.8* 6.9* SPECIMEN CONTAMINATED, UNABLE TO PERFORM TEST(S). 6.7*  HCT 18.5*   < > 20.3* 20.0* SPECIMEN CONTAMINATED, UNABLE TO PERFORM TEST(S). 19.9*  PLT 313  --  209  --  SPECIMEN CONTAMINATED, UNABLE TO PERFORM TEST(S). 234   < > = values in this interval not displayed.    COAGS: Recent Labs    11/08/19 1525  INR 1.5*    BMP: Recent Labs    11/08/19 0353 11/08/19 1334 11/09/19 0444 11/09/19 0540  NA 135 135 SPECIMEN CONTAMINATED, UNABLE TO PERFORM TEST(S). 137  K 4.3 4.6 SPECIMEN CONTAMINATED, UNABLE TO PERFORM TEST(S). 4.3  CL 102 104 SPECIMEN CONTAMINATED, UNABLE TO PERFORM TEST(S). 103  CO2 27 24 SPECIMEN CONTAMINATED, UNABLE TO PERFORM TEST(S). 26  GLUCOSE 137* 183* SPECIMEN CONTAMINATED, UNABLE TO PERFORM TEST(S). 161*  BUN 27* 33* SPECIMEN CONTAMINATED, UNABLE TO PERFORM TEST(S). 35*  CALCIUM 8.1* 7.5* SPECIMEN CONTAMINATED, UNABLE TO PERFORM TEST(S). 7.9*  CREATININE 0.74 0.89 SPECIMEN CONTAMINATED, UNABLE TO PERFORM TEST(S). 0.76  GFRNONAA >60 >60 SPECIMEN CONTAMINATED, UNABLE TO PERFORM TEST(S). >60  GFRAA >60 >60 SPECIMEN CONTAMINATED, UNABLE TO PERFORM TEST(S). >60    LIVER FUNCTION TESTS: Recent Labs    11/03/19 0521 11/05/19 0618 11/09/19 0444 11/09/19 0540  BILITOT 0.7 0.3 SPECIMEN CONTAMINATED, UNABLE TO PERFORM TEST(S). 0.7  AST 48* 64* SPECIMEN CONTAMINATED, UNABLE TO PERFORM TEST(S). 46*  ALT 48* 68* SPECIMEN CONTAMINATED, UNABLE TO PERFORM TEST(S). 56*  ALKPHOS 82 72 SPECIMEN CONTAMINATED, UNABLE TO PERFORM TEST(S). 48  PROT 6.2* 6.4* SPECIMEN CONTAMINATED, UNABLE TO PERFORM TEST(S). 5.6*  ALBUMIN 1.4* 1.6* SPECIMEN CONTAMINATED, UNABLE TO PERFORM TEST(S). 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 Rowe Robert, PA-C 11/09/2019, 1:14 PM   I spent a total of 15 minutes at the the patient's bedside AND on the patient's hospital floor or unit, greater than 50% of which was counseling/coordinating care for right upper abdominal fluid collection drain    Patient ID: Matthew Schwartz, male   DOB: 14-Jun-1932, 86 y.o.   MRN: [id number]

## 2019-11-10 LAB — BPAM RBC
Blood Product Expiration Date: 202107222359
Blood Product Expiration Date: 202107222359
Blood Product Expiration Date: 202107292359
Blood Product Expiration Date: 202107292359
ISSUE DATE / TIME: 202107111607
ISSUE DATE / TIME: 202107111819
ISSUE DATE / TIME: 202107120815
ISSUE DATE / TIME: 202107121136
Unit Type and Rh: 7300
Unit Type and Rh: 7300
Unit Type and Rh: 7300
Unit Type and Rh: 7300

## 2019-11-10 LAB — TYPE AND SCREEN
ABO/RH(D): B POS
Antibody Screen: NEGATIVE
Unit division: 0
Unit division: 0
Unit division: 0
Unit division: 0

## 2019-11-10 LAB — CBC
HCT: 25.1 % — ABNORMAL LOW (ref 39.0–52.0)
Hemoglobin: 8.5 g/dL — ABNORMAL LOW (ref 13.0–17.0)
MCH: 28.1 pg (ref 26.0–34.0)
MCHC: 33.9 g/dL (ref 30.0–36.0)
MCV: 83.1 fL (ref 80.0–100.0)
Platelets: 223 10*3/uL (ref 150–400)
RBC: 3.02 MIL/uL — ABNORMAL LOW (ref 4.22–5.81)
RDW: 18.1 % — ABNORMAL HIGH (ref 11.5–15.5)
WBC: 14.1 10*3/uL — ABNORMAL HIGH (ref 4.0–10.5)
nRBC: 0 % (ref 0.0–0.2)

## 2019-11-10 LAB — BASIC METABOLIC PANEL
Anion gap: 8 (ref 5–15)
BUN: 34 mg/dL — ABNORMAL HIGH (ref 8–23)
CO2: 29 mmol/L (ref 22–32)
Calcium: 8.3 mg/dL — ABNORMAL LOW (ref 8.9–10.3)
Chloride: 102 mmol/L (ref 98–111)
Creatinine, Ser: 0.72 mg/dL (ref 0.61–1.24)
GFR calc Af Amer: >60 mL/min (ref >60)
GFR calc non Af Amer: >60 mL/min (ref >60)
Glucose, Bld: 121 mg/dL — ABNORMAL HIGH (ref 70–99)
Potassium: 3.6 mmol/L (ref 3.5–5.1)
Sodium: 139 mmol/L (ref 135–145)

## 2019-11-10 LAB — GLUCOSE, CAPILLARY
Glucose-Capillary: 112 mg/dL — ABNORMAL HIGH (ref 70–99)
Glucose-Capillary: 130 mg/dL — ABNORMAL HIGH (ref 70–99)

## 2019-11-10 LAB — PHOSPHORUS: Phosphorus: 3.2 mg/dL (ref 2.5–4.6)

## 2019-11-10 LAB — MAGNESIUM: Magnesium: 2.2 mg/dL (ref 1.7–2.4)

## 2019-11-10 MED ORDER — ACETAMINOPHEN 10 MG/ML IV SOLN: 1000.0000 mg | Freq: Four times a day (QID) | INTRAVENOUS | Status: AC | PRN

## 2019-11-10 MED ORDER — TRAVASOL 10 % IV SOLN
INTRAVENOUS | Status: AC
  Filled 2019-11-10: qty 1208.4

## 2019-11-10 MED ORDER — POTASSIUM CHLORIDE 10 MEQ/100ML IV SOLN
10.0000 meq | INTRAVENOUS | Status: AC
  Administered 2019-11-10 (×4): 10 meq via INTRAVENOUS
  Filled 2019-11-10: qty 100

## 2019-11-10 NOTE — Progress Notes (Signed)
Progress Note: General Surgery Service   Chief Complaint/Subjective: Cc fatigue and wanting to go home. Pain controlled with medications.  Palliative saw yesterday for [place] discussion and plans to follow up today.  Hgb/hct 8.5/25 after blood products (4 u pRBC, 2 u FFP)  Objective: Vital signs in last 24 hours: Temp:  [97.3 F (36.3 C)-99 F (37.2 C)] 97.4 F (36.3 C) (07/13 0400) Pulse Rate:  [75-92] 87 (07/13 0500) Resp:  [16-33] 29 (07/13 0500) BP: (123-176)/(55-81) 164/76 (07/13 0500) SpO2:  [93 %-99 %] 97 % (07/13 0500) Weight:  [95.8 kg] 95.8 kg (07/13 0356) Last BM Date: 11/08/19  Intake/Output from previous day: 07/12 0701 - 07/13 0700 In: 2698.2 [I.V.:1917.1; Blood:781.1] Out: 1950 [[id number]] Intake/Output this shift: No intake/output data recorded.  Gen: NAD  Resp: nonlabored  Card: RRR  Abd: soft, open wound with healthy granulation tissue at base, RUQ drain to gravity with nothing in bag.  Lab Results: CBC  Recent Labs    11/09/19 0540 11/09/19 0540 11/09/19 1712 11/10/19 0330  WBC 11.7*  --   --  14.1*  HGB 6.7*   < > 9.4* 8.5*  HCT 19.9*   < > 27.4* 25.1*  PLT 234  --   --  223   < > = values in this interval not displayed.   BMET Recent Labs    11/09/19 0540 11/10/19 0330  NA 137 139  K 4.3 3.6  CL 103 102  CO2 26 29  GLUCOSE 161* 121*  BUN 35* 34*  CREATININE 0.76 0.72  CALCIUM 7.9* 8.3*   PT/INR Recent Labs    11/08/19 1525  LABPROT 17.7*  INR 1.5*   ABG No results for input(s): PHART, HCO3 in the last 72 hours.  Invalid input(s): PCO2, PO2  Anti-infectives: Anti-infectives (From admission, onward)   Start     Dose/Rate Route Frequency Ordered Stop   11/06/19 1800  fluconazole (DIFLUCAN) IVPB 400 mg     Discontinue     400 mg 100 mL/hr over 120 Minutes Intravenous Every 24 hours 11/05/19 1850     10/31/19 2200  vancomycin (VANCOREADY) IVPB 1250 mg/250 mL  Status:  Discontinued        1,250 mg 166.7 mL/hr over 90  Minutes Intravenous Every 12 hours 10/31/19 1602 11/03/19 1152   10/29/19 2200  meropenem (MERREM) 1 g in sodium chloride 0.9 % 100 mL IVPB        1 g 200 mL/hr over 30 Minutes Intravenous Every 8 hours 10/29/19 2008 11/05/19 2207   10/29/19 2200  vancomycin (VANCOCIN) IVPB 1000 mg/200 mL premix  Status:  Discontinued        1,000 mg 200 mL/hr over 60 Minutes Intravenous Every 12 hours 10/29/19 2008 10/31/19 1602   10/25/19 1800  fluconazole (DIFLUCAN) IVPB 400 mg  Status:  Discontinued        400 mg 100 mL/hr over 120 Minutes Intravenous Every 24 hours 10/24/19 1750 11/05/19 1850   10/24/19 1830  fluconazole (DIFLUCAN) IVPB 800 mg  Status:  Discontinued        800 mg 200 mL/hr over 120 Minutes Intravenous  Once 10/24/19 1750 10/24/19 1755   10/24/19 1830  fluconazole (DIFLUCAN) IVPB 800 mg        800 mg 100 mL/hr over 240 Minutes Intravenous Every 24 hours 10/24/19 1756 10/24/19 2240   10/12/19 2000  piperacillin-tazobactam (ZOSYN) IVPB 3.375 g  Status:  Discontinued        3.375 g 12.5 mL/hr  over 240 Minutes Intravenous Every 8 hours 10/12/19 1911 10/29/19 1945   10/12/19 1708  ceFAZolin (ANCEF) 2-4 GM/100ML-% IVPB       Note to Pharmacy: Bennye Alm   : cabinet override      10/12/19 1708 10/12/19 1849      Medications: Scheduled Meds:  sodium chloride   Intravenous Once   chlorhexidine  15 mL Mouth Rinse BID   Chlorhexidine Gluconate Cloth  6 each Topical Daily   insulin aspart  0-20 Units Subcutaneous Q6H   insulin detemir  10 Units Subcutaneous BID   lidocaine  2 patch Transdermal Q24H   mouth rinse  15 mL Mouth Rinse q12n4p   metoprolol tartrate  5 mg Intravenous Q4H   pantoprazole (PROTONIX) IV  40 mg Intravenous Q12H   simethicone  160 mg Oral QID   sodium chloride flush  10-40 mL Intracatheter Q12H   sodium chloride flush  5 mL Intracatheter Q8H   sucralfate  1 g Oral BID   Continuous Infusions:  sodium chloride Stopped (11/05/19 1544)   sodium  chloride 10 mL/hr at 11/07/19 1300   acetaminophen     fluconazole (DIFLUCAN) IV Stopped (11/09/19 1910)   methocarbamol (ROBAXIN) IV Stopped (11/09/19 9381)   norepinephrine (LEVOPHED) Adult infusion Stopped (11/08/19 1910)   TPN ADULT (ION) 95 mL/hr at 11/10/19 0400   PRN Meds:.sodium chloride, acetaminophen, acetaminophen, acetaminophen, bisacodyl, fentaNYL (SUBLIMAZE) injection, hydrALAZINE, ipratropium-albuterol, lip balm, LORazepam, melatonin, methocarbamol (ROBAXIN) IV, ondansetron (ZOFRAN) IV, prochlorperazine, silver nitrate applicators, sodium chloride flush  Assessment/Plan: Tobacco abuse Prostate CA DM2 LUE DVT  RP hematoma - heparin D/C-ed 7/12, no longer candidate for anticoagulation   Perforated duodenal ulcer s/p Procedure(s): EXPLORATORY LAPAROTOMY omental patch repair perforated duodenal ulcer 10/12/2019 Dr. 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

## 2019-11-10 NOTE — Progress Notes (Signed)
PHARMACY - TOTAL PARENTERAL NUTRITION CONSULT NOTE   Indication: Prolonged ileus  Patient Measurements: Height: '5\' 11"'  (180.3 cm) Weight: 95.8 kg (211 lb 3.2 oz) IBW/kg (Calculated) : 75.3 TPN AdjBW (KG): 91.9 Body mass index is 29.46 kg/m. Usual Weight: 90.7 kg   Assessment:  Pharmacy is consulted to start TPN in 86 yo male with prolonged ileus. Pt underwent exploratory laparotomy on 6/14 which revealed perforated duodenal ulcer.   Glucose / Insulin: hx of DM - on metformin PTA.  - CBGs improved to > 100 with insulin at 50 units/TPN bag. Also remains on rSSI q6h (CBG goal range 100-150) CBG 156 - 112 - 11 units of SSI required in past 24h - Levemir 10 units BID per CCM started 6/18  Electrolytes: K+ 3.6 (target >/= 4.0) . Magnesium 2.2 (target >/= 2.0). All other lytes, including Corrected Ca, WNL.   Renal: BUN remains elevated , SCr wnl, unchanged  LFTs / TGs: AST/ALT 46/56, decreasing; T.bili, Alk Phos WNL. TG 65 (7/5), 73 (7/12) Prealbumin / albumin: Prealbumin 8.3 (7/5), 9.6 (6/28), 9.0 (6/21), 10.9 (7/12) Albumin 1.6 (7/8), 2.3 (7/12 Intake / Output; MIVF: NS at [facility]; NG removed; 4L UOP, transfusions GI Imaging:  - 6/21 Upper GI series: small leak from superior margin of duodenal bulb. - 6/23 CT abdomen: 4.3 cm air-fluid collection in the right upper quadrant of the abdomen between the duodenal bulb and gallbladder,not containing enteric contrast. No residual leak from the duodenal bulb identified. - 7/1 Abdominal CT: large irregular high density noted most consistent with enlarging subcutaneous or IM hematoma - 7/6 Upper GI showed no leak.  Will need repeat sinogram without fistula to start oral feeds to maximize chance of this healing.   Surgeries / Procedures:  - 6/14 Exploratory laparotomy which revealed perforated duodenal ulcer - 6/24 RUQ drain placed > 10 ml > cultured - 6/25 removal R abd JP drain  - 7/2 R abd drain placed  Central access:  PICC Line 6/15 TPN start  date: 6/15  Nutritional Goals: per RD recommendation on 7/6: Kcal:  2200-2400 Protein:  115-125 grams Fluid:  >2L/d  Goal TPN rate is 95 mL/hr (provides 121 g of protein and 2262 kcals per day)  Current Nutrition:  NPO   Plan:  KCl runs 110mq x 4 today  Continue TPN at 95 mL/hr at 1800, goal rate   Electrolytes in TPN:   85 mEq/L of Na, 85 mEq/L of K , 4 mEq/L of Ca, 12 mEq/L of Magnesium, and 15 mmol/L of Phos. Cl:Ac ratio max Acetate  Standard MVI and trace elements to TPN  Continue resistant q6h SSI, levemir and adjust as needed   Continue 50 units of regular insulin/day in TPN as yesterday. Aiming to prevent hypoglycemia.    [facility]   Monitor TPN labs on Mon/Thurs  GMinda DittoPharmD 11/10/2019, 10:19 AM

## 2019-11-10 NOTE — Progress Notes (Signed)
NAME:  Matthew Schwartz, MRN:  [id number], DOB:  1932/09/02, LOS: 75 ADMISSION DATE:  10/12/2019, CONSULTATION DATE:  6/14 REFERRING MD:  [name], CHIEF COMPLAINT:  Abdominal pain   Brief History   86 y/o male, former smoker, presented with nausea/vomiting, abdominal pain and constipation x 4 to 5 days.  Seen twice prior to current admit for the same with imaging studies unremarkable for cause.  On return 6/14, seen by GI and found to have concern for acute abdomen and taken for exploratory laparotomy.  Found to have perforated duodenal ulcer s/p omental patch repair.  Remained on vent post op.  PCCM consulted for critical care management. Extubated on 6/17, had IR drain of abdominal fluid on 6/23. PCCM signed off on 6/25.  PCCM asked to see again for dyspnea on 7/1. He was transferred to the medical floor, anticipation was to transfer to select specialty for further long-term acute care Noted to be hypotensive-investigation so far significant for low hematocrit Large retroperitoneal hematoma  Past Medical History  Prostate cancer, HTN, GERD, HLD, DM  Significant Hospital Events   6/14 Admit, exploratory laparotomy, omental patch repair of duodenal ulcer 6/16 Off vasopressors, less drainage from abd drain / clearing  6/17 extubated, weak cough, added high flow oxygen 6/18 start heparin gtt 6/20 d/c amiodarone 6/23 new fluid collection on CT abd > Drained by IR 6/24 To [place] 6/25 PCCM sign off 7/01 back to ICU with fever, tachypnea >> PCCM asked to reassess 7/2 comfortable this morning 7/11 large retroperitoneal hematoma-transferred back to the ICU 7/13 stable hemodynamics  Consults:  Gastroenterology General surgery Cardiology  Procedures:  Rt IJ CVL 6/14 >> 6/15 Rt radial a line 6/14 >> 6/17 ETT 6/14 >> 6/17 LUE PICC 6/16 >>   Significant Diagnostic Tests:   CT angio abd/pelvis 6/13 >> mild atherosclerosis, no significant arterial stenosis of mesenteric vasculature, no findings of  bowel ischemia, colonic diverticulosis w/o diverticulitis, b/l renal cysts, 12 mm lesion upper pole Rt kidney  ECHO 6/15 >> poor image quality, appears to be regional wall motion variability but confident wall motion analysis is impossible even after contrast administration, LVEF ~ 95-18%, Grade I diastolic dysfunction, RV systolic function is moderately reduced, RV is mildly enlarged  6/19 venous doppler LUE >> acute deep vein thrombosis involving the left axillary vein, surrounding the PICC line. Findings consistent with acute superficial vein thrombosis involving the left cephalic vein.  UGI 6/21 >> small leak from duodenum  CT ABD/Pelvis 6/23 >> interval midline abdominal incision with incomplete skin surgical closure, probable small hematomas within the anterior abdominal wall and the omentum. Nonspecific 4.3 cm air-fluid collection in the RUQ of the abdomen between the duodenal bulb and gallbladder, not containing enteric contrast. No residual leak from the duodenal bulb identified.  New small bilateral pleural effusions with dependent L>R lower lobe opacities  Upper GI series 6/30 >> Small puckered stump of a leak at the site of the prior elongated eak. Findings suggest resolving but persistent extraluminal collection.  CT abd/pelvis 7/01 >> Anterior midline surgical incision is again noted. Large irregular high density is noted in this area most consistent with enlarging subcutaneous or intramuscular hematoma.  Interval placement of pigtail drainage catheter in the area of fluid collection noted on prior exam in the right upper quadrant. Fluid collection is significantly decompressed compared to prior exam.  Micro Data:  SARS CoV2 6/13 >> negative RUQ fluid collection 6/24 >> moderate Candida albicans Blood 6/25 >>   Antimicrobials:  Zosyn  6/14 >> 7/01 Fluconazole 6/26 >> Meropenem 7/01 -7/8 Vancomycin 7/01 -7/5  Interim history/subjective:  Feels a little bit better  today Complains of constipation Leg pain is better  Objective   Blood pressure (!) 149/73, pulse 82, temperature 98.3 F (36.8 C), temperature source Axillary, resp. rate 18, height 5\' 11"  (1.803 m), weight 95.8 kg, SpO2 96 %.        Intake/Output Summary (Last 24 hours) at 11/10/2019 0902 Last data filed at 11/10/2019 0400 Gross per 24 hour  Intake 2547.89 ml  Output 1650 ml  Net 897.89 ml   Filed Weights   11/09/19 0444 11/10/19 0356 11/10/19 0736  Weight: 95.8 kg 95.8 kg 95.8 kg    Examination: General: Appears comfortable HENT: Moist oral mucosa PULM: Clear to auscultation CV: S1-S2 appreciated GI: Bowel sounds appreciated, drain in place MSK: normal bulk and tone Neuro: Awake and alert, following commands, no focality  Resolved Hospital Problem list   Septic shock, Acute respiratory failure with hypoxia  Assessment & Plan:  Peritonitis in setting of perforated duodenal ulcer s/p omental patch repair NPO Ice chips Upper GI series on 11/03/2018-no leak identified -Repeat leak test planned for 7/16  Retroperitoneal hematoma Unfortunately this developed spontaneously on heparin at therapeutic doses We cannot continue heparin He should not be restarted on heparin -This should self resorb  RUQ fluid collection s/p IR drain with candida albicans in culture Fever 7/1 Abx per primary-recently completed meropenem -On fluconazole IV  Acute systolic heart failure in setting of sepsis > improved Demand ischemia> improved History of CAD, hypertension, hyperlipidemia Tele IV metoprolol clonidine -Medications that may affect his blood pressure being held at present for hypotension  L arm DVT related to PICC Heparin was resumed on 7/3 Hemoglobin was stable up until 711 when it dropped acutely Found to have a retroperitoneal bleed Heparin stopped  DM2 Levemir/SSI accuchecks  Anemia of critical illness and chronic disease Acute blood loss anemia Monitor for  bleeding Transfuse PRBC for Hgb < 7 gm/dL  Moderate Malnutrition in setting of critical illness TPN  Acute blood loss anemia with hemodynamic compromise Has a retroperitoneal bleed Discussed with Dr. Grandville Silos  Extensive discussion with daughter, spouse and patient 7/12 Discussed with patient's other daughter at bedside today 7/13  Goals of care discussions Decision regarding DO NOT RESUSCITATE-ongoing All patient really wants to do now is to eat  Discussions regarding hospice started Family needs more time to discuss things amongst themselves  Best practice:   PER TRH   Labs   CBC: Recent Labs  Lab 11/08/19 1334 11/08/19 1334 11/08/19 2225 11/08/19 2225 11/09/19 0013 11/09/19 0444 11/09/19 0540 11/09/19 1712 11/10/19 0330  WBC 11.3*  --  11.3*  --   --  SPECIMEN CONTAMINATED, UNABLE TO PERFORM TEST(S). 11.7*  --  14.1*  NEUTROABS 9.0*  --   --   --   --  SPECIMEN CONTAMINATED, UNABLE TO PERFORM TEST(S). 9.5*  --   --   HGB 5.9*   < > 6.8*   < > 6.9* SPECIMEN CONTAMINATED, UNABLE TO PERFORM TEST(S). 6.7* 9.4* 8.5*  HCT 18.5*   < > 20.3*   < > 20.0* SPECIMEN CONTAMINATED, UNABLE TO PERFORM TEST(S). 19.9* 27.4* 25.1*  MCV 84.1  --  85.7  --   --  SPECIMEN CONTAMINATED, UNABLE TO PERFORM TEST(S). 86.1  --  83.1  PLT 313  --  209  --   --  SPECIMEN CONTAMINATED, UNABLE TO PERFORM TEST(S). 234  --  223   < > = values in this interval not displayed.    Basic Metabolic Panel: Recent Labs  Lab 11/07/19 0505 11/07/19 0505 11/08/19 0353 11/08/19 1334 11/09/19 0444 11/09/19 0540 11/10/19 0330  NA 132*   < > 135 135 SPECIMEN CONTAMINATED, UNABLE TO PERFORM TEST(S). 137 139  K 4.3   < > 4.3 4.6 SPECIMEN CONTAMINATED, UNABLE TO PERFORM TEST(S). 4.3 3.6  CL 103   < > 102 104 SPECIMEN CONTAMINATED, UNABLE TO PERFORM TEST(S). 103 102  CO2 25   < > 27 24 SPECIMEN CONTAMINATED, UNABLE TO PERFORM TEST(S). 26 29  GLUCOSE 86   < > 137* 183* SPECIMEN CONTAMINATED, UNABLE TO  PERFORM TEST(S). 161* 121*  BUN 27*   < > 27* 33* SPECIMEN CONTAMINATED, UNABLE TO PERFORM TEST(S). 35* 34*  CREATININE 0.70   < > 0.74 0.89 SPECIMEN CONTAMINATED, UNABLE TO PERFORM TEST(S). 0.76 0.72  CALCIUM 8.0*   < > 8.1* 7.5* SPECIMEN CONTAMINATED, UNABLE TO PERFORM TEST(S). 7.9* 8.3*  MG 2.2  --  2.1  --  SPECIMEN CONTAMINATED, UNABLE TO PERFORM TEST(S). 2.2 2.2  PHOS 3.1  --  3.8  --  SPECIMEN CONTAMINATED, UNABLE TO PERFORM TEST(S). 4.0 3.2   < > = values in this interval not displayed.   GFR: Estimated Creatinine Clearance: 81.2 mL/min (by C-G formula based on SCr of 0.72 mg/dL). Recent Labs  Lab 11/08/19 2225 11/09/19 0444 11/09/19 0540 11/10/19 0330  WBC 11.3* SPECIMEN CONTAMINATED, UNABLE TO PERFORM TEST(S). 11.7* 14.1*    Liver Function Tests: Recent Labs  Lab 11/05/19 0618 11/09/19 0444 11/09/19 0540  AST 64* SPECIMEN CONTAMINATED, UNABLE TO PERFORM TEST(S). 46*  ALT 68* SPECIMEN CONTAMINATED, UNABLE TO PERFORM TEST(S). 56*  ALKPHOS 72 SPECIMEN CONTAMINATED, UNABLE TO PERFORM TEST(S). 48  BILITOT 0.3 SPECIMEN CONTAMINATED, UNABLE TO PERFORM TEST(S). 0.7  PROT 6.4* SPECIMEN CONTAMINATED, UNABLE TO PERFORM TEST(S). 5.6*  ALBUMIN 1.6* SPECIMEN CONTAMINATED, UNABLE TO PERFORM TEST(S). 2.3*   No results for input(s): LIPASE, AMYLASE in the last 168 hours. No results for input(s): AMMONIA in the last 168 hours.  ABG    Component Value Date/Time   PHART 7.481 (H) 10/29/2019 2230   PCO2ART 33.9 10/29/2019 2230   PO2ART 156 (H) 10/29/2019 2230   HCO3 24.8 10/29/2019 2230   TCO2 22 10/12/2019 1717   ACIDBASEDEF 2.6 (H) 10/13/2019 0311   O2SAT 99.6 10/29/2019 2230     Coagulation Profile: Recent Labs  Lab 11/08/19 1525  INR 1.5*    Cardiac Enzymes: No results for input(s): CKTOTAL, CKMB, CKMBINDEX, TROPONINI in the last 168 hours.  HbA1C: Hgb A1c MFr Bld  Date/Time Value Ref Range Status  10/12/2019 02:30 PM 6.6 (H) 4.8 - 5.6 % Final    Comment:     (NOTE) Pre diabetes:          5.7%-6.4%  Diabetes:              >6.4%  Glycemic control for   <7.0% adults with diabetes     CBG: Recent Labs  Lab 11/09/19 0603 11/09/19 1215 11/09/19 1756 11/09/19 2339 11/10/19 0526  GLUCAP 156* 154* 136* 151* 112*   Sherrilyn Rist, MD [facility] Pager: [phone]

## 2019-11-10 NOTE — Progress Notes (Signed)
Referring Physician(s): Clent Demark  Supervising Physician: Pascal Lux  Patient Status:  [facility] - In-pt  Chief Complaint: Abdominal pain/fluid collection   Subjective: Pt resting quietly, No new c/o. Family still dicussing long term GOC. Poss LTAC in discussion   Allergies: Losartan potassium  Medications:  Current Facility-Administered Medications:  .  0.9 %  sodium chloride infusion (Manually program via Guardrails IV Fluids), , Intravenous, Once, Eugenie Filler, MD .  0.9 %  sodium chloride infusion, , Intravenous, Continuous, Chesley Mires, MD, Stopped at 11/05/19 1544 .  0.9 %  sodium chloride infusion, , Intravenous, PRN, Chesley Mires, MD, Last Rate: 10 mL/hr at 11/07/19 1300, Rate Verify at 11/07/19 1300 .  acetaminophen (OFIRMEV) IV 1,000 mg, 1,000 mg, Intravenous, Q6H PRN, Eugenie Filler, MD .  acetaminophen (TYLENOL) 160 MG/5ML solution 650 mg, 650 mg, Per Tube, Q4H PRN, Eugenie Filler, MD, 650 mg at 11/08/19 0430 .  acetaminophen (TYLENOL) suppository 325 mg, 325 mg, Rectal, Q4H PRN, Eugenie Filler, MD .  bisacodyl (DULCOLAX) suppository 10 mg, 10 mg, Rectal, Daily PRN, Simonne Maffucci B, MD, 10 mg at 11/09/19 1808 .  chlorhexidine (PERIDEX) 0.12 % solution 15 mL, 15 mL, Mouth Rinse, BID, Samtani, Jai-Gurmukh, MD, 15 mL at 11/10/19 0918 .  Chlorhexidine Gluconate Cloth 2 % PADS 6 each, 6 each, Topical, Daily, Chesley Mires, MD, 6 each at 11/10/19 [phone]  fentaNYL (SUBLIMAZE) injection 25-50 mcg, 25-50 mcg, Intravenous, Q2H PRN, Eugenie Filler, MD, 50 mcg at 11/10/19 1212 .  fluconazole (DIFLUCAN) IVPB 400 mg, 400 mg, Intravenous, Q24H, Lenis Noon, RPH, Stopped at 11/09/19 1910 .  hydrALAZINE (APRESOLINE) injection 10 mg, 10 mg, Intravenous, Q4H PRN, Alma Friendly, MD, 10 mg at 11/04/19 0806 .  insulin aspart (novoLOG) injection 0-20 Units, 0-20 Units, Subcutaneous, Q6H, Adrian Saran, [facility], 3 Units at 11/10/19 1221 .  insulin detemir (LEVEMIR)  injection 10 Units, 10 Units, Subcutaneous, BID, Chesley Mires, MD, 10 Units at 11/10/19 1100 .  ipratropium-albuterol (DUONEB) 0.5-2.5 (3) MG/3ML nebulizer solution 3 mL, 3 mL, Nebulization, Q4H PRN, Alma Friendly, MD, 3 mL at 10/30/19 1923 .  lidocaine (LIDODERM) 5 % 2 patch, 2 patch, Transdermal, Q24H, Eugenie Filler, MD, 2 patch at 11/10/19 1128 .  lip balm (CARMEX) ointment, , Topical, PRN, Tanda Rockers, MD, Given at 11/09/19 1501 .  LORazepam (ATIVAN) injection 0.5 mg, 0.5 mg, Intravenous, Q12H PRN, Alma Friendly, MD, 0.5 mg at 11/10/19 1116 .  MEDLINE mouth rinse, 15 mL, Mouth Rinse, q12n4p, Samtani, Jai-Gurmukh, MD, 15 mL at 11/10/19 1215 .  melatonin tablet 10 mg, 10 mg, Per Tube, QHS PRN, Lang Snow, FNP, 10 mg at 11/08/19 0041 .  methocarbamol (ROBAXIN) 500 mg in dextrose 5 % 50 mL IVPB, 500 mg, Intravenous, Q8H PRN, Eugenie Filler, MD, Stopped at 11/09/19 [phone] .  metoprolol tartrate (LOPRESSOR) injection 5 mg, 5 mg, Intravenous, Q4H, Eugenie Filler, MD, 5 mg at 11/10/19 0918 .  norepinephrine (LEVOPHED) 4mg  in 232mL premix infusion, 0-40 mcg/min, Intravenous, Titrated, Olalere, Adewale A, MD, Stopped at 11/08/19 1910 .  ondansetron (ZOFRAN) injection 4 mg, 4 mg, Intravenous, Q6H PRN, Allie Bossier, MD, 4 mg at 11/10/19 2595 .  pantoprazole (PROTONIX) injection 40 mg, 40 mg, Intravenous, Q12H, Eugenie Filler, MD, 40 mg at 11/10/19 0918 .  potassium chloride 10 mEq in 100 mL IVPB, 10 mEq, Intravenous, Q1 Hr x 4, Green, Terri L, RPH, Stopped at 11/10/19 1259 .  prochlorperazine (COMPAZINE) injection 10 mg, 10 mg, Intravenous, Q6H PRN, Eugenie Filler, MD, 10 mg at 11/10/19 0752 .  silver nitrate applicators applicator 1 application, 1 application, Topical, Daily PRN, [name], MD .  simethicone (MYLICON) chewable tablet 160 mg, 160 mg, Oral, QID, Eugenie Filler, MD, 160 mg at 11/10/19 0918 .  sodium chloride flush (NS) 0.9 % injection 10-40  mL, 10-40 mL, Intracatheter, Q12H, Chesley Mires, MD, 10 mL at 11/10/19 0923 .  sodium chloride flush (NS) 0.9 % injection 10-40 mL, 10-40 mL, Intracatheter, PRN, Chesley Mires, MD, 10 mL at 10/23/19 1720 .  sodium chloride flush (NS) 0.9 % injection 5 mL, 5 mL, Intracatheter, Q8H, Wagner, Jaime, DO, 5 mL at 11/10/19 0616 .  sucralfate (CARAFATE) 1 GM/10ML suspension 1 g, 1 g, Oral, BID, Leighton Ruff, MD, 1 g at 03/54/65 6812 .  TPN ADULT (ION), , Intravenous, Continuous TPN, Minda Ditto, RPH, Last Rate: 95 mL/hr at 11/10/19 0400, Rate Verify at 11/10/19 0400 .  TPN ADULT (ION), , Intravenous, Continuous TPN, Green, Terri L, RPH    Vital Signs: BP (!) 164/69 Comment: anxious ativan administered  Pulse 84   Temp (!) 96.8 F (36 C) (Axillary)   Resp 16   Ht 5\' 11"  (1.803 m)   Wt 95.8 kg   SpO2 100%   BMI 29.46 kg/m   Physical Exam awake abd drain intact, skin site clean, dry. scant output in gravity bag.  Imaging: CT ABDOMEN PELVIS W CONTRAST  Result Date: 11/08/2019 CLINICAL DATA:  Abdominal pain. EXAM: CT ABDOMEN AND PELVIS WITH CONTRAST TECHNIQUE: Multidetector CT imaging of the abdomen and pelvis was performed using the standard protocol following bolus administration of intravenous contrast. CONTRAST:  126mL OMNIPAQUE IOHEXOL 300 MG/ML  SOLN COMPARISON:  October 29, 2019 FINDINGS: Lower chest: There are a few small pulmonary nodules at the lung bases. Atelectasis is noted. There are trace bilateral pleural effusions. The band coronary artery calcifications are noted. Hepatobiliary: The liver is normal. The gallbladder is distended as before. There is dilatation of the common bile duct.There is no biliary ductal dilation. Pancreas: Normal contours without ductal dilatation. No peripancreatic fluid collection. Spleen: Spleen is unremarkable. There is a small collection adjacent to the upper pole of the spleen which has decreased in size from prior study. Adrenals/Urinary Tract: --Adrenal  glands: Unremarkable. --Right kidney/ureter: No hydronephrosis or radiopaque kidney stones. --Left kidney/ureter: No hydronephrosis or radiopaque kidney stones. --Urinary bladder: Unremarkable. Stomach/Bowel: --Stomach/Duodenum: No hiatal hernia or other gastric abnormality. Normal duodenal course and caliber. --Small bowel: Unremarkable. --Colon: There are scattered diverticula without CT evidence for diverticulitis. --Appendix: Not visualized. No right lower quadrant inflammation or free fluid. Vascular/Lymphatic: Atherosclerotic calcification is present within the non-aneurysmal abdominal aorta, without hemodynamically significant stenosis. --No retroperitoneal lymphadenopathy. --No mesenteric lymphadenopathy. --No pelvic or inguinal lymphadenopathy. Reproductive: There are few brachytherapy beads within the prostate gland Other: There is a new large retroperitoneal hematoma measuring approximately 14 x 15 by 15.5 cm. There is a hematocrit level within this retroperitoneal hematoma. There is evidence for active bleeding within the right psoas muscle, new since prior study. There is a percutaneous drain in the right upper quadrant. There is suggestion of a small fistulous connection to the duodenal bulb as was demonstrated on the patient's recent fistulogram. There is a trace amount of free fluid in the upper abdomen. Musculoskeletal. There is a persistent hematoma in the right rectus muscle which has decreased in size since the prior study. There is  no acute displaced fracture. IMPRESSION: 1. New large right-sided retroperitoneal hematoma with evidence for active bleeding within the right psoas muscle. 2. Persistent but decreased size of the right rectus muscle hematoma. 3. Distended gallbladder with dilatation of the common bile duct. Correlation with laboratory studies is recommended. If there is concern for acute cholecystitis, follow-up with ultrasound is recommended. 4. 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 Aortic Atherosclerosis (ICD10-I70.0). These results were called by telephone at the time of interpretation on 11/08/2019 at 3:26 pm to provider [facility] , who verbally acknowledged these results. Electronically Signed   By: Constance Holster M.D.   On: 11/08/2019 15:27   DG Sinus/Fist Tube Chk-Non GI  Result Date: 11/06/2019 INDICATION: Status post drainage of abdominal abscess. EXAM: Injection of drainage catheter in right upper quadrant. MEDICATIONS: The patient is currently admitted to the hospital and receiving intravenous antibiotics. The antibiotics were administered within an appropriate time frame prior to the initiation of the procedure. ANESTHESIA/SEDATION: None. COMPLICATIONS: None immediate. PROCEDURE: Under fluoroscopy, contrast was injected through pre-existing catheter in the right upper quadrant. This demonstrates filling of fistula extending into proximal duodenum with filling of the duodenum indicating patency. IMPRESSION: Upon injection of pigtail drainage catheter in right upper quadrant, filling of proximal duodenum is noted consistent with patent fistula or connection with drained abscess. Electronically Signed   By: Marijo Conception M.D.   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

## 2019-11-10 NOTE — Progress Notes (Signed)
PROGRESS NOTE    Matthew Schwartz  DJM:[id number] DOB: 26-Jan-1933 DOA: 10/12/2019 PCP: [name], MD   Chief Complaint  Patient presents with  . Abdominal Pain  . Vomiting    Brief Narrative:  [name] NIDDM, HLD, GERD, irritable bowel syndrome (constipation) status post left knee replacement 2009, PTCA 2000-->RCA well as prior prostate CA, former smoker Admit 10/12/2019 severe abdominal pain-ex lap 10/12/2019-perforated duodenal ulcer and remained on vent NSVT postop-echo showed Systolic HF--was on pressors and cardiology consulted-eventually extubated 6/17 + fevers 6/23= postoperative abscess-IR  Placed abd perc drain 6/24--growing Candida /on diflucan On 6/30 upper GI series were performed-and repeated Unfortunately spiked a fever without cause-found to have on CT 7/1 enlarging hematoma General surgery felt this was minimal and not the cause for his fever He has not had high-grade fever since his Antibiotics were adjusted 7/1 vancomycin and Primaxin but continues to have low-grade Heparin was held temporarily until 7/3 and restarted cautiously 7/3 after risk-benefit discussion with family  He has stabilized to some degree on stepdown unit he has had some pain and swelling in his upper extremities likely from the prior DVT in his left upper extremity-his right arm was imaged for DVT and found to be negative He has a long road ahead and TOC is working with patient and family to figure out next steps in terms of placement he may be able to eventually go to an [facility]   Principal Problem:   Severe sepsis with septic shock ([place]) Active Problems:   Hypotension   Hemorrhagic shock (HCC):11/08/2019   Perforated duodenal ulcer ([name])   Peritonitis ([name])   Nontraumatic retroperitoneal hematoma: 11/08/2019   HTN (hypertension)   Malignant neoplasm of prostate ([place])   Hyperlipidemia   Obstipation   HLD (hyperlipidemia)   Diabetes mellitus type 2,  uncontrolled, with complications ([name])   Perforated bowel ([name])   Acute respiratory insufficiency   Elevated troponin level   Acute respiratory failure with hypoxia (HCC)   PVCs (premature ventricular contractions)   Bilateral atelectasis   Acute abdominal pain   Palliative care by specialist   Goals of care, counseling/discussion   General weakness   Epigastric pain   Gastric ulcer   Hypokalemia   NSVT (nonsustained ventricular tachycardia) ([name])   Diarrhea   NSTEMI (non-ST elevated myocardial infarction) ([name])   Acute deep vein thrombosis (DVT) of left upper extremity ([name])   Acute systolic CHF (congestive heart failure) (HCC)   Anemia of chronic disease   Acute respiratory failure with hypoxemia (HCC)   Intraabdominal fluid collection  1 septic shock secondary to peritonitis secondary to perforated duodenal ulcer Status post expiratory laparotomy, omental patch repair of duodenal ulcer per Dr. 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  Currently afebrile.  Normal white count.  Patient noted to have upper GI series 10/28/2019 showing persistent small leak with repeat upper GI series done on 11/03/2018 one of the duodenal bulb likely related to prior surgical repair.  No definite persistent leak identified on today's examination.  Patient noted to have fluid collections 10/21/2019 with drain placement with cultures positive for Candida.  Status post drain placement.  On IV fluconazole.  Patient noted to have a T-max of 102.1 on 7/1/ 2021 with a change in antibiotics from IV vancomycin(s/p 5 days) to IV Merrem.  Blood cultures obtained with no growth to date.  Currently afebrile with a normal white count.  Dr. [name] discussed  with general surgery, Dr. [name] on 10/30/2019 and was felt that enlarging hematoma was not source of infection as it would drain into the midline wound.  Continue IV PPI, bowel rest, NG tube, TPN.  Patient s/p 8 days of IV Merrem.   IV of Merrem has been discontinued.  Patient afebrile.  Loose stools have decreased.  C. difficile PCR was negative.  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   Right upper quadrant drain intact.  Per general surgery likely need a repeat fistulogram this week to assess for closure. Continue IV fluconazole while drain in place as cultures were positive for Candida albicans.  Mobilize.  KUB unremarkable.  NG tube has been removed.  Will likely need SLP evaluation prior to p.o. intake.  Per IR repeat fistulogram on 11/13/2019.  General surgery recommending ongoing n.p.o. and drain for now.  Per general surgery.  IR following.  2.  Hypotension/hemorrhagic shock secondary to large retroperitoneal Patient noted to be hypotensive 3/35/4562 with systolic blood pressures in the 70s requiring transfer to the ICU and pressors of Levophed.  CBC done noted a hemoglobin of 5.9.  CT abdomen and pelvis done consistent with a large retroperitoneal hematoma with evidence of active bleed within the right psoas muscle.  Persistent but decreased size of right rectus muscle hematoma.  Patient was transferred to the ICU and transfused 2 units of FFP, 4 units total of packed red blood cells on 11/08/2019, 11/09/2019.  Levophed subsequently titrated off.  Hemoglobin currently at 8.5 after 4 units of packed red blood cells over the past 24 to 48 hours.  IV heparin has been discontinued and patient no longer a candidate for anticoagulation.  Clonidine patch discontinued.  Nitroglycerin paste discontinued.  IV Lopressor dose decreased.  Follow H&H.  PCCM consulted and are following.  3.  Acute hypoxic respiratory failure Status post extubation 10/15/2019.  NG tube has been discontinued.  Patient with sats of 96% on room air.    4.  Acute systolic heart failure/non-STEMI/coronary artery disease 2000/NSVT Euvolemic.  Blood pressure initially was stable however patient now noted th to be  hypotensive on 5/63/8937 with systolic blood pressures in the 70s.  Clonidine patch discontinued.  IV beta-blocker decreased to 5 mg every 4 hours.  Early on in the hospitalization patient noted to have a 5-6 beat run of nonsustained V. tach however asymptomatic on 11/04/2019.  Potassium at 4.3.  Magnesium at 2.2.  Phosphorus at 4.  IV beta-blocker dose had been increased to 10 mg every 4 hours per cardiology however due to hemorrhagic shock IV Lopressor has been decreased.If develops nonsustained V. tach uptitrate Lopressor as tolerated by blood pressure back to 10 mg IV every 4 hours. Keep potassium > 4, keep magnesium > 2.   5.  Left upper extremity DVT cephalic/axillary vein Likely secondary to PICC line.  IV heparin discontinued 11/08/2019 due to hemorrhagic shock secondary to large retroperitoneal hematoma.  Follow.  6.  Right upper extremity swelling and pain and discomfort Heparin initially discontinued on 10/29/2019 after enlarging hematoma.  Heparin was resumed without a bolus on 10/31/2019 after Dr. [name] discussed with a specialist.  Right upper extremity discomfort initially felt to be secondary to a DVT however Dopplers negative.  Patient now with large retroperitoneal hematoma with hemorrhagic shock on 11/08/2019 and as such heparin discontinued.  Patient likely not a candidate for anticoagulation.  Supportive care.    7.  Well-controlled diabetes mellitus type 2 Hemoglobin  A1c 6.6.  Blood glucose level of 112 this morning.  Patient noted not to be a diabetic prior to admission and elevated CBGs likely secondary to TPN.  Sliding scale insulin.  8.  Anemia of critical illness/acute blood loss anemia Status post transfusion of 2 units packed red blood cells (10/25/2019).  Patient underwent hemorrhagic shock on 11/08/2019 secondary to large retroperitoneal hematoma.  Status post transfusion of 2 units packed red blood cells 11/08/2019 and 2 units of FFP.  Hemoglobin at 6.7 on 11/09/2019 and patient  transfused another 2 units of packed red blood cells hemoglobin currently at 8.5. Transfusion threshold hemoglobin  <7.   9.  Malignancy of the prostate Outpatient follow-up.  10.  Tobacco abuse Tobacco cessation.  11.  Diarrhea Per RN patient with voluminous loose stools noted in rectal pouch a 6- 7 days ago which has since improved.  C. difficile PCR negative.  KUB negative.  IV antibiotics discontinued.  Follow.  12.  Lower extremity pain Secondary to retroperitoneal hematoma.  Clinical improvement with transfusion of packed red blood cells and FFP in addition to pain management.  Continue Lidoderm patch, IV Robaxin as needed, IV Tylenol.  Supportive care.  Palliative care following.   13.  Prognosis Patient with multiple comorbidities, complicated course during this hospitalization.  Patient admitted with sepsis secondary to peritonitis, duodenal ulcer with perforation status post repair, complicated by non-STEMI, acute CHF.  Patient with ongoing fistula currently n.p.o. on TPN, left upper extremity DVT placed on heparin however complicated by abdominal hematoma and now with large retroperitoneal hematoma leading to hemorrhagic shock requiring pressors and currently off pressors.  Patient transfused total of 6 units packed red blood cells during this hospitalization, as well as 2 units of FFP.  Palliative care following.  Patient with a poor prognosis.    DVT prophylaxis: SCDs Code Status: Full Family Communication: Updated patient and daughter at bedside. Disposition:   Status is: Inpatient    Dispo:  Patient From: [place]  Planned Disposition:    Expected discharge date: 11/27/19  Medically stable for discharge:   Patient currently not medically stable for discharge.  Patient with a hemorrhagic shock on 11/08/2019 resuscitated with blood products and had to be placed on a Levophed drip.  Patient on TPN, IV antifungal, patient still with ongoing IR fistula tract  to the duodenal bulb.  Patient in ICU.  Will downgrade to stepdown status.        Consultants:   Palliative care: Dr. Rowe Pavy 10/24/2019  Cardiology: Dr. Margaretann Loveless 10/13/2019  General surgery: Dr. [name] 10/12/2019  Gastroenterology: Dr. Therisa Doyne 10/12/2019  PCCM: Dr. Halford Chessman 10/12/2019  Procedures:  CT abdomen and pelvis 10/21/2019, 10/29/2019  CT angiogram abdomen and pelvis 10/11/2019  Upper GI series 10/19/2019, 10/28/2019, 11/03/2019  2D echo 10/13/2019, 10/16/2019  Right upper extremity Doppler 11/02/2019  Left upper extremity Dopplers 10/17/2019  CT-guided drain placement right upper quadrant fluid collection per interventional radiology, Dr. Earleen Newport 10/22/2019  Exploratory laparotomy, omental patch repair of duodenal ulcer per Dr. [name] 10/12/2019  Right IJ CVL 10/12/2019>>>> 10/13/2019  Right radial A-line 10/12/2019>>>> 10/15/2019  ETT 10/12/2019>>>> 10/15/2019  Left upper extremity PICC 10/14/2019  Fistulogram 11/06/2019, 10/30/2019  Transfusion 2 units packed red blood cells 10/25/2019  Transfusion of 2 units packed red blood cells 11/08/2019  Transfusion of 2 units FFP 11/08/2019  Transfuse 2 units packed red blood cells 11/09/2019  Significant Hospital Events   6/14 Admit, exploratory laparotomy, omental patch repair of duodenal ulcer 6/16 Off vasopressors,  less drainage from abd drain / clearing  6/17 extubated, weak cough, added high flow oxygen 6/18 start heparin gtt 6/20 d/c amiodarone 6/23 new fluid collection on CT abd > Drained by IR 6/24 To [facility] 6/25 PCCM sign off 7/01 back to ICU with fever, tachypnea >> PCCM asked to reassess 7/2comfortable this morning 11/08/2019 patient noted to go into hemorrhagic shock got transferred to the ICU placed on a Levophed drip which was subsequently weaned off.  Transfused 2 units packed red blood cells, 2 units FFP and CT abdomen the pelvis which was done concerning for large retroperitoneal hematoma.     Significant Diagnostic  Tests:   CT angio abd/pelvis 6/13 >> mild atherosclerosis, no significant arterial stenosis of mesenteric vasculature, no findings of bowel ischemia, colonic diverticulosis w/o diverticulitis, b/l renal cysts, 12 mm lesion upper pole Rt kidney  ECHO 6/15 >> poor image quality, appears to be regional wall motion variability but confident wall motion analysis is impossible even after contrast administration, LVEF ~ 75-44%, Grade I diastolic dysfunction, RV systolic function is moderately reduced, RV is mildly enlarged  6/19 venous doppler LUE>>acute deep vein thrombosis involving the left axillary vein, surrounding the PICC line. Findings consistent with acute superficial vein thrombosis involving the left cephalic vein.  UGI 6/21 >> small leak from duodenum  CT ABD/Pelvis 6/23 >>interval midline abdominal incision with incomplete skin surgical closure, probable small hematomas within the anterior abdominal wall and the omentum. Nonspecific 4.3 cm air-fluid collection in the RUQ of the abdomen between the duodenal bulb and gallbladder, not containing enteric contrast. No residual leak from the duodenal bulb identified. New small bilateral pleural effusions with dependent L>R lower lobe opacities  Upper GI series 6/30 >>Small puckered stump of a leak at the site of the prior elongatedeak. Findings suggest resolving but persistent extraluminalcollection.  CT abd/pelvis 7/01 >>Anterior midline surgical incision is again noted. Large irregular high density is noted in this area most consistent with enlarging subcutaneous or intramuscular hematoma. Interval placement of pigtail drainage catheter in the area of fluid collection noted on prior exam in the right upper quadrant. Fluid collection is significantly decompressed compared to prior exam. Micro Data:  SARS CoV2 6/13 >> negative RUQ fluid collection 6/24 >> moderate Candida albicans Blood 6/25 >>negative Blood culture 10/29/2019>>>  negative     Antimicrobials:   IV Merrem 10/29/2019>>>>>> 11/05/2019  IV Zosyn 10/12/2019>>>> 10/29/2019  IV vancomycin 10/29/2019>>>> 11/03/2019  IV fluconazole 10/25/2019>>>>   Subjective: Patient just received some pain medication and in a deep sleep.  Daughter at bedside.  No bleeding noted per RN.  Per daughter patient with some complaints of headache early on today however per daughter patient has not complained of lower extremity pain this morning.    Objective: Vitals:   11/10/19 0500 11/10/19 0736 11/10/19 0800 11/10/19 0815  BP: (!) 164/76  (!) 167/68 (!) 149/73  Pulse: 87  87 82  Resp: (!) 29  19 18   Temp:    98.3 F (36.8 C)  TempSrc:    Axillary  SpO2: 97%  96% 96%  Weight:  95.8 kg    Height:        Intake/Output Summary (Last 24 hours) at 11/10/2019 1031 Last data filed at 11/10/2019 0923 Gross per 24 hour  Intake 2557.89 ml  Output 1650 ml  Net 907.89 ml   Filed Weights   11/09/19 0444 11/10/19 0356 11/10/19 0736  Weight: 95.8 kg 95.8 kg 95.8 kg    Examination:  General exam:  Dry mucous membranes.  Asleep. Respiratory system: CTAB anterior lung fields.  Cardiovascular system: RRR no murmurs rubs or gallops.  No JVD.  No lower extremity edema.  Gastrointestinal system: Abdomen is distended, soft, positive bowel sounds, bandage on midline wound, right upper quadrant drain with scant drainage.  Nontender to palpation.  No rebound.  No guarding.   Central nervous system: Asleep. No focal neurological deficits. Extremities: Symmetric 5 x 5 power. Skin: No rashes, lesions or ulcers Psychiatry: Judgement and insight appear normal. Mood & affect appropriate.     Data Reviewed: I have personally reviewed following labs and imaging studies  CBC: Recent Labs  Lab 11/08/19 1334 11/08/19 1334 11/08/19 2225 11/08/19 2225 11/09/19 0013 11/09/19 0444 11/09/19 0540 11/09/19 1712 11/10/19 0330  WBC 11.3*  --  11.3*  --   --  SPECIMEN CONTAMINATED, UNABLE TO  PERFORM TEST(S). 11.7*  --  14.1*  NEUTROABS 9.0*  --   --   --   --  SPECIMEN CONTAMINATED, UNABLE TO PERFORM TEST(S). 9.5*  --   --   HGB 5.9*   < > 6.8*   < > 6.9* SPECIMEN CONTAMINATED, UNABLE TO PERFORM TEST(S). 6.7* 9.4* 8.5*  HCT 18.5*   < > 20.3*   < > 20.0* SPECIMEN CONTAMINATED, UNABLE TO PERFORM TEST(S). 19.9* 27.4* 25.1*  MCV 84.1  --  85.7  --   --  SPECIMEN CONTAMINATED, UNABLE TO PERFORM TEST(S). 86.1  --  83.1  PLT 313  --  209  --   --  SPECIMEN CONTAMINATED, UNABLE TO PERFORM TEST(S). 234  --  223   < > = values in this interval not displayed.    Basic Metabolic Panel: Recent Labs  Lab 11/07/19 0505 11/07/19 0505 11/08/19 0353 11/08/19 1334 11/09/19 0444 11/09/19 0540 11/10/19 0330  NA 132*   < > 135 135 SPECIMEN CONTAMINATED, UNABLE TO PERFORM TEST(S). 137 139  K 4.3   < > 4.3 4.6 SPECIMEN CONTAMINATED, UNABLE TO PERFORM TEST(S). 4.3 3.6  CL 103   < > 102 104 SPECIMEN CONTAMINATED, UNABLE TO PERFORM TEST(S). 103 102  CO2 25   < > 27 24 SPECIMEN CONTAMINATED, UNABLE TO PERFORM TEST(S). 26 29  GLUCOSE 86   < > 137* 183* SPECIMEN CONTAMINATED, UNABLE TO PERFORM TEST(S). 161* 121*  BUN 27*   < > 27* 33* SPECIMEN CONTAMINATED, UNABLE TO PERFORM TEST(S). 35* 34*  CREATININE 0.70   < > 0.74 0.89 SPECIMEN CONTAMINATED, UNABLE TO PERFORM TEST(S). 0.76 0.72  CALCIUM 8.0*   < > 8.1* 7.5* SPECIMEN CONTAMINATED, UNABLE TO PERFORM TEST(S). 7.9* 8.3*  MG 2.2  --  2.1  --  SPECIMEN CONTAMINATED, UNABLE TO PERFORM TEST(S). 2.2 2.2  PHOS 3.1  --  3.8  --  SPECIMEN CONTAMINATED, UNABLE TO PERFORM TEST(S). 4.0 3.2   < > = values in this interval not displayed.    GFR: Estimated Creatinine Clearance: 81.2 mL/min (by C-G formula based on SCr of 0.72 mg/dL).  Liver Function Tests: Recent Labs  Lab 11/05/19 0618 11/09/19 0444 11/09/19 0540  AST 64* SPECIMEN CONTAMINATED, UNABLE TO PERFORM TEST(S). 46*  ALT 68* SPECIMEN CONTAMINATED, UNABLE TO PERFORM TEST(S). 56*  ALKPHOS 72  SPECIMEN CONTAMINATED, UNABLE TO PERFORM TEST(S). 48  BILITOT 0.3 SPECIMEN CONTAMINATED, UNABLE TO PERFORM TEST(S). 0.7  PROT 6.4* SPECIMEN CONTAMINATED, UNABLE TO PERFORM TEST(S). 5.6*  ALBUMIN 1.6* SPECIMEN CONTAMINATED, UNABLE TO PERFORM TEST(S). 2.3*    CBG: Recent Labs  Lab 11/09/19 0603 11/09/19 1215 11/09/19 1756  11/09/19 2339 11/10/19 0526  GLUCAP 156* 154* 136* 151* 112*     Recent Results (from the past 240 hour(s))  C Difficile Quick Screen (NO PCR Reflex)     Status: None   Collection Time: 11/05/19 12:17 PM   Specimen: STOOL  Result Value Ref Range Status   C Diff antigen NEGATIVE NEGATIVE Final   C Diff toxin NEGATIVE NEGATIVE Final   C Diff interpretation NEGATIVE  Final    Comment: Performed at [facility], [place] [address] [place], [place] [address]         Radiology Studies: CT ABDOMEN PELVIS W CONTRAST  Result Date: 11/08/2019 CLINICAL DATA:  Abdominal pain. EXAM: CT ABDOMEN AND PELVIS WITH CONTRAST TECHNIQUE: Multidetector CT imaging of the abdomen and pelvis was performed using the standard protocol following bolus administration of intravenous contrast. CONTRAST:  144mL OMNIPAQUE IOHEXOL 300 MG/ML  SOLN COMPARISON:  October 29, 2019 FINDINGS: Lower chest: There are a few small pulmonary nodules at the lung bases. Atelectasis is noted. There are trace bilateral pleural effusions. The band coronary artery calcifications are noted. Hepatobiliary: The liver is normal. The gallbladder is distended as before. There is dilatation of the common bile duct.There is no biliary ductal dilation. Pancreas: Normal contours without ductal dilatation. No peripancreatic fluid collection. Spleen: Spleen is unremarkable. There is a small collection adjacent to the upper pole of the spleen which has decreased in size from prior study. Adrenals/Urinary Tract: --Adrenal glands: Unremarkable. --Right kidney/ureter: No hydronephrosis or radiopaque kidney stones. --Left  kidney/ureter: No hydronephrosis or radiopaque kidney stones. --Urinary bladder: Unremarkable. Stomach/Bowel: --Stomach/Duodenum: No hiatal hernia or other gastric abnormality. Normal duodenal course and caliber. --Small bowel: Unremarkable. --Colon: There are scattered diverticula without CT evidence for diverticulitis. --Appendix: Not visualized. No right lower quadrant inflammation or free fluid. Vascular/Lymphatic: Atherosclerotic calcification is present within the non-aneurysmal abdominal aorta, without hemodynamically significant stenosis. --No retroperitoneal lymphadenopathy. --No mesenteric lymphadenopathy. --No pelvic or inguinal lymphadenopathy. Reproductive: There are few brachytherapy beads within the prostate gland Other: There is a new large retroperitoneal hematoma measuring approximately 14 x 15 by 15.5 cm. There is a hematocrit level within this retroperitoneal hematoma. There is evidence for active bleeding within the right psoas muscle, new since prior study. There is a percutaneous drain in the right upper quadrant. There is suggestion of a small fistulous connection to the duodenal bulb as was demonstrated on the patient's recent fistulogram. There is a trace amount of free fluid in the upper abdomen. Musculoskeletal. There is a persistent hematoma in the right rectus muscle which has decreased in size since the prior study. There is no acute displaced fracture. IMPRESSION: 1. New large right-sided retroperitoneal hematoma with evidence for active bleeding within the right psoas muscle. 2. Persistent but decreased size of the right rectus muscle hematoma. 3. Distended gallbladder with dilatation of the common bile duct. Correlation with laboratory studies is recommended. If there is concern for acute cholecystitis, follow-up with ultrasound is recommended. 4. Stable positioning of the percutaneous drain in the right upper quadrant with a probable fistulous connection to the duodenal bulb.  Aortic Atherosclerosis (ICD10-I70.0). These results were called by telephone at the time of interpretation on 11/08/2019 at 3:26 pm to provider [facility] , who verbally acknowledged these results. Electronically Signed   By: Constance Holster M.D.   On: 11/08/2019 15:27        Scheduled Meds: . sodium chloride   Intravenous Once  . chlorhexidine  15 mL Mouth Rinse  BID  . Chlorhexidine Gluconate Cloth  6 each Topical Daily  . insulin aspart  0-20 Units Subcutaneous Q6H  . insulin detemir  10 Units Subcutaneous BID  . lidocaine  2 patch Transdermal Q24H  . mouth rinse  15 mL Mouth Rinse q12n4p  . metoprolol tartrate  5 mg Intravenous Q4H  . pantoprazole (PROTONIX) IV  40 mg Intravenous Q12H  . simethicone  160 mg Oral QID  . sodium chloride flush  10-40 mL Intracatheter Q12H  . sodium chloride flush  5 mL Intracatheter Q8H  . sucralfate  1 g Oral BID   Continuous Infusions: . sodium chloride Stopped (11/05/19 1544)  . sodium chloride 10 mL/hr at 11/07/19 1300  . fluconazole (DIFLUCAN) IV Stopped (11/09/19 1910)  . methocarbamol (ROBAXIN) IV Stopped (11/09/19 3552)  . norepinephrine (LEVOPHED) Adult infusion Stopped (11/08/19 1910)  . potassium chloride    . TPN ADULT (ION) 95 mL/hr at 11/10/19 0400  . TPN ADULT (ION)       LOS: 29 days    Time spent: 40 minutes    Irine Seal, MD [facility]   To contact the attending provider between 7A-7P or the covering provider during after hours 7P-7A, please log into the web site [email or web address] and access using universal [place] password for that web site. If you do not have the password, please call the hospital operator.  11/10/2019, 10:31 AM

## 2019-11-11 ENCOUNTER — Inpatient Hospital Stay (HOSPITAL_COMMUNITY): Payer: Medicare Other

## 2019-11-11 LAB — CBC
HCT: 28.2 % — ABNORMAL LOW (ref 39.0–52.0)
Hemoglobin: 9.4 g/dL — ABNORMAL LOW (ref 13.0–17.0)
MCH: 29.4 pg (ref 26.0–34.0)
MCHC: 33.3 g/dL (ref 30.0–36.0)
MCV: 88.1 fL (ref 80.0–100.0)
Platelets: 279 10*3/uL (ref 150–400)
RBC: 3.2 MIL/uL — ABNORMAL LOW (ref 4.22–5.81)
RDW: 19 % — ABNORMAL HIGH (ref 11.5–15.5)
WBC: 13.2 10*3/uL — ABNORMAL HIGH (ref 4.0–10.5)
nRBC: 0.2 % (ref 0.0–0.2)

## 2019-11-11 LAB — MAGNESIUM: Magnesium: 2.1 mg/dL (ref 1.7–2.4)

## 2019-11-11 LAB — BASIC METABOLIC PANEL
Anion gap: 7 (ref 5–15)
BUN: 32 mg/dL — ABNORMAL HIGH (ref 8–23)
CO2: 26 mmol/L (ref 22–32)
Calcium: 8.3 mg/dL — ABNORMAL LOW (ref 8.9–10.3)
Chloride: 103 mmol/L (ref 98–111)
Creatinine, Ser: 0.66 mg/dL (ref 0.61–1.24)
GFR calc Af Amer: >60 mL/min (ref >60)
GFR calc non Af Amer: >60 mL/min (ref >60)
Glucose, Bld: 97 mg/dL (ref 70–99)
Potassium: 3.8 mmol/L (ref 3.5–5.1)
Sodium: 136 mmol/L (ref 135–145)

## 2019-11-11 LAB — PHOSPHORUS: Phosphorus: 3.2 mg/dL (ref 2.5–4.6)

## 2019-11-11 LAB — GLUCOSE, CAPILLARY
Glucose-Capillary: 110 mg/dL — ABNORMAL HIGH (ref 70–99)
Glucose-Capillary: 109 mg/dL — ABNORMAL HIGH (ref 70–99)
Glucose-Capillary: 96 mg/dL (ref 70–99)
Glucose-Capillary: 131 mg/dL — ABNORMAL HIGH (ref 70–99)

## 2019-11-11 MED ORDER — POTASSIUM CHLORIDE 10 MEQ/100ML IV SOLN
10.0000 meq | INTRAVENOUS | Status: AC
  Administered 2019-11-11 (×4): 10 meq via INTRAVENOUS
  Filled 2019-11-11 (×2): qty 100

## 2019-11-11 MED ORDER — DIPHENHYDRAMINE HCL 50 MG/ML IJ SOLN: 12.5000 mg | Freq: Once | INTRAMUSCULAR | Status: AC

## 2019-11-11 MED ORDER — TRAVASOL 10 % IV SOLN
INTRAVENOUS | Status: AC
  Filled 2019-11-11: qty 1208.4

## 2019-11-11 MED ORDER — DIPHENHYDRAMINE HCL 50 MG/ML IJ SOLN
INTRAMUSCULAR | Status: AC
  Administered 2019-11-11: 12.5 mg
  Filled 2019-11-11: qty 1

## 2019-11-11 NOTE — Progress Notes (Signed)
Referring Physician(s): Gerkin,T  Supervising Physician: Jacqulynn Cadet  Patient Status:  [facility] - In-pt  Chief Complaint: Abdominal pain/fluid collection   Subjective: Pt sitting up in chair; no acute changes; daughter in room; states pt did cough up some green phlegm this am; CXR ok   Allergies: Losartan potassium  Medications: Prior to Admission medications   Medication Sig Start Date End Date Taking? Authorizing Provider  alfuzosin (UROXATRAL) 10 MG 24 hr tablet Take 10 mg by mouth daily. 09/07/19  Yes [provider]  amLODipine (NORVASC) 5 MG tablet Take 5 mg by mouth daily. 07/13/19  Yes [provider]  aspirin 81 MG tablet Take 81 mg by mouth every morning.    Yes [provider]  atorvastatin (LIPITOR) 40 MG tablet Take 40 mg by mouth at bedtime.    Yes [provider]  dicyclomine (BENTYL) 20 MG tablet Take 1 tablet (20 mg total) by mouth 2 (two) times daily. 10/11/19  Yes Nuala Alpha A, PA-C  famotidine (PEPCID) 20 MG tablet Take 20 mg by mouth 2 (two) times daily.   Yes [provider]  hydrocortisone valerate cream (WESTCORT) 0.2 % Apply 1 application topically 2 (two) times daily.  09/20/19  Yes [provider]  isosorbide mononitrate (IMDUR) 60 MG 24 hr tablet Take 60 mg by mouth every evening.   Yes [provider]  lactulose (CHRONULAC) 10 GM/15ML solution Take 10 g by mouth as needed for mild constipation.   Yes [provider]  metFORMIN (GLUCOPHAGE) 500 MG tablet Take 500 mg by mouth 2 (two) times daily. 09/20/19  Yes [provider]  metoprolol succinate (TOPROL-XL) 50 MG 24 hr tablet Take 50 mg by mouth 2 (two) times daily. 04/07/14  Yes [provider]  potassium chloride (KLOR-CON) 10 MEQ tablet Take 2 tablets (20 mEq total) by mouth daily for 3 days. 10/11/19 10/14/19 Yes Nuala Alpha A, PA-C  PROCTOSOL HC 2.5 % rectal cream Place 1 application rectally daily as  needed for hemorrhoids or itching.  11/06/13  Yes [provider]  ramipril (ALTACE) 10 MG capsule Take 10 mg by mouth 2 (two) times daily.    Yes [provider]  RESTASIS 0.05 % ophthalmic emulsion Place 1 drop into both eyes 2 (two) times daily.  09/09/19  Yes [provider]  senna-docusate (SENOKOT-S) 8.6-50 MG tablet Take 1 tablet by mouth daily. 10/09/19  Yes Lucrezia Starch, MD  [facility] injection Inject as directed once. 04/10/17   [provider]     Vital Signs: BP 123/64   Pulse 93   Temp (!) 97.4 F (36.3 C) (Oral)   Resp 18   Ht 5\' 11"  (1.803 m)   Wt 202 lb 9.6 oz (91.9 kg)   SpO2 99%   BMI 28.26 kg/m   Physical Exam awake, asking when he will be getting out of hospital; RUQ drain intact, dressing dry, OP trace amount of light green fluid; abd dist  Imaging: DG Chest 1 View  Result Date: 11/11/2019 CLINICAL DATA:  Congestion of upper respiratory tract. EXAM: CHEST  1 VIEW COMPARISON:  November 03, 2019. FINDINGS: Stable cardiomediastinal silhouette. Nasogastric tube has been removed. No pneumothorax is noted. Stable left lingular subsegmental atelectasis is noted. Right lung is clear. Left-sided PICC line is again noted with tip in right atrium. Bony thorax is unremarkable. IMPRESSION: Stable left lingular subsegmental atelectasis. Nasogastric tube has been removed. Electronically Signed   By: Bobbe Medico.D.  On: 11/11/2019 09:21   CT ABDOMEN PELVIS W CONTRAST  Result Date: 11/08/2019 CLINICAL DATA:  Abdominal pain. EXAM: CT ABDOMEN AND PELVIS WITH CONTRAST TECHNIQUE: Multidetector CT imaging of the abdomen and pelvis was performed using the standard protocol following bolus administration of intravenous contrast. CONTRAST:  123mL OMNIPAQUE IOHEXOL 300 MG/ML  SOLN COMPARISON:  October 29, 2019 FINDINGS: Lower chest: There are a few small pulmonary nodules at the lung bases. Atelectasis is noted. There are trace bilateral pleural effusions.  The band coronary artery calcifications are noted. Hepatobiliary: The liver is normal. The gallbladder is distended as before. There is dilatation of the common bile duct.There is no biliary ductal dilation. Pancreas: Normal contours without ductal dilatation. No peripancreatic fluid collection. Spleen: Spleen is unremarkable. There is a small collection adjacent to the upper pole of the spleen which has decreased in size from prior study. Adrenals/Urinary Tract: --Adrenal glands: Unremarkable. --Right kidney/ureter: No hydronephrosis or radiopaque kidney stones. --Left kidney/ureter: No hydronephrosis or radiopaque kidney stones. --Urinary bladder: Unremarkable. Stomach/Bowel: --Stomach/Duodenum: No hiatal hernia or other gastric abnormality. Normal duodenal course and caliber. --Small bowel: Unremarkable. --Colon: There are scattered diverticula without CT evidence for diverticulitis. --Appendix: Not visualized. No right lower quadrant inflammation or free fluid. Vascular/Lymphatic: Atherosclerotic calcification is present within the non-aneurysmal abdominal aorta, without hemodynamically significant stenosis. --No retroperitoneal lymphadenopathy. --No mesenteric lymphadenopathy. --No pelvic or inguinal lymphadenopathy. Reproductive: There are few brachytherapy beads within the prostate gland Other: There is a new large retroperitoneal hematoma measuring approximately 14 x 15 by 15.5 cm. There is a hematocrit level within this retroperitoneal hematoma. There is evidence for active bleeding within the right psoas muscle, new since prior study. There is a percutaneous drain in the right upper quadrant. There is suggestion of a small fistulous connection to the duodenal bulb as was demonstrated on the patient's recent fistulogram. There is a trace amount of free fluid in the upper abdomen. Musculoskeletal. There is a persistent hematoma in the right rectus muscle which has decreased in size since the prior study.  There is no acute displaced fracture. IMPRESSION: 1. New large right-sided retroperitoneal hematoma with evidence for active bleeding within the right psoas muscle. 2. Persistent but decreased size of the right rectus muscle hematoma. 3. Distended gallbladder with dilatation of the common bile duct. Correlation with laboratory studies is recommended. If there is concern for acute cholecystitis, follow-up with ultrasound is recommended. 4. Stable positioning of the percutaneous drain in the right upper quadrant with a probable fistulous connection to the duodenal bulb. Aortic Atherosclerosis (ICD10-I70.0). These results were called by telephone at the time of interpretation on 11/08/2019 at 3:26 pm to provider [facility] , who verbally acknowledged these results. Electronically Signed   By: Constance Holster M.D.   On: 11/08/2019 15:27    Labs:  CBC: Recent Labs    11/09/19 0444 11/09/19 0444 11/09/19 0540 11/09/19 1712 11/10/19 0330 11/11/19 0514  WBC SPECIMEN CONTAMINATED, UNABLE TO PERFORM TEST(S).  --  11.7*  --  14.1* 13.2*  HGB SPECIMEN CONTAMINATED, UNABLE TO PERFORM TEST(S).   < > 6.7* 9.4* 8.5* 9.4*  HCT SPECIMEN CONTAMINATED, UNABLE TO PERFORM TEST(S).   < > 19.9* 27.4* 25.1* 28.2*  PLT SPECIMEN CONTAMINATED, UNABLE TO PERFORM TEST(S).  --  234  --  223 279   < > = values in this interval not displayed.    COAGS: Recent Labs    11/08/19 1525  INR 1.5*    BMP: Recent Labs  11/09/19 0444 11/09/19 0540 11/10/19 0330 11/11/19 0611  NA SPECIMEN CONTAMINATED, UNABLE TO PERFORM TEST(S). 137 139 136  K SPECIMEN CONTAMINATED, UNABLE TO PERFORM TEST(S). 4.3 3.6 3.8  CL SPECIMEN CONTAMINATED, UNABLE TO PERFORM TEST(S). 103 102 103  CO2 SPECIMEN CONTAMINATED, UNABLE TO PERFORM TEST(S). 26 29 26   GLUCOSE SPECIMEN CONTAMINATED, UNABLE TO PERFORM TEST(S). 161* 121* 97  BUN SPECIMEN CONTAMINATED, UNABLE TO PERFORM TEST(S). 35* 34* 32*  CALCIUM SPECIMEN CONTAMINATED, UNABLE TO  PERFORM TEST(S). 7.9* 8.3* 8.3*  CREATININE SPECIMEN CONTAMINATED, UNABLE TO PERFORM TEST(S). 0.76 0.72 0.66  GFRNONAA SPECIMEN CONTAMINATED, UNABLE TO PERFORM TEST(S). >60 >60 >60  GFRAA SPECIMEN CONTAMINATED, UNABLE TO PERFORM TEST(S). >60 >60 >60    LIVER FUNCTION TESTS: Recent Labs    11/03/19 0521 11/05/19 0618 11/09/19 0444 11/09/19 0540  BILITOT 0.7 0.3 SPECIMEN CONTAMINATED, UNABLE TO PERFORM TEST(S). 0.7  AST 48* 64* SPECIMEN CONTAMINATED, UNABLE TO PERFORM TEST(S). 46*  ALT 48* 68* SPECIMEN CONTAMINATED, UNABLE TO PERFORM TEST(S). 56*  ALKPHOS 82 72 SPECIMEN CONTAMINATED, UNABLE TO PERFORM TEST(S). 48  PROT 6.2* 6.4* SPECIMEN CONTAMINATED, UNABLE TO PERFORM TEST(S). 5.6*  ALBUMIN 1.4* 1.6* SPECIMEN CONTAMINATED, UNABLE TO PERFORM TEST(S). 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 Nasogastric tube has been removed. Pall care following- family still desires full code with aggressive med interventions;  will plan f/u drain injection on 7/16   Electronically Signed: D. Rowe Robert, PA-C 11/11/2019, 11:01 AM   I spent a total of 15 minutes at the the patient's bedside AND on the patient's hospital floor or unit, greater than 50% of which was counseling/coordinating care for right upper abdominal fluid collection drain    Patient ID: Matthew Schwartz, male   DOB: 1932-09-30, 86 y.o.   MRN: [id number]

## 2019-11-11 NOTE — TOC Progression Note (Signed)
Transition of Care [facility]) - Progression Note    Patient Details  Name: Matthew Schwartz MRN: [id number] Date of Birth: 1932/11/19  Transition of Care [facility]) CM/SW [place], [place] Phone Number: 11/11/2019, 2:32 PM  Clinical Narrative:   Consulted with physician about referring patient to [place].  She advised holding off until at least Friday when patient is scheduled for procedure that will give more information about future course of medical treatment. TOC will continue to follow during the course of hospitalization.     Expected Discharge Plan: Long Term Acute Care (LTAC) Barriers to Discharge: Continued Medical Work up  Expected Discharge Plan and Services Expected Discharge Plan: [place] (LTAC)   Discharge Planning Services: CM Consult   Living arrangements for the past 2 months: Single Family Home                                       Social Determinants of Health (SDOH) Interventions    Readmission Risk Interventions Readmission Risk Prevention Plan 11/09/2019  Transportation Screening Complete  Medication Review Press photographer) Complete  HRI or [place] Complete  SW Recovery Care/Counseling Consult Complete  [place] Not Applicable  Some recent data might be hidden

## 2019-11-11 NOTE — Progress Notes (Signed)
NAME:  Matthew Schwartz, MRN:  [id number], DOB:  01-24-1933, LOS: 51 ADMISSION DATE:  10/12/2019, CONSULTATION DATE:  6/14 REFERRING MD:  [name], CHIEF COMPLAINT:  Abdominal pain   Brief History   86 y/o male, former smoker, presented with nausea/vomiting, abdominal pain and constipation x 4 to 5 days.  Seen twice prior to current admit for the same with imaging studies unremarkable for cause.  On return 6/14, seen by GI and found to have concern for acute abdomen and taken for exploratory laparotomy.  Found to have perforated duodenal ulcer s/p omental patch repair.  Remained on vent post op.  PCCM consulted for critical care management. Extubated on 6/17, had IR drain of abdominal fluid on 6/23. PCCM signed off on 6/25.  PCCM asked to see again for dyspnea on 7/1. He was transferred to the medical floor, anticipation was to transfer to select specialty for further long-term acute care Noted to be hypotensive-investigation so far significant for low hematocrit Large retroperitoneal hematoma  Past Medical History  Prostate cancer, HTN, GERD, HLD, DM  Significant Hospital Events   6/14 Admit, exploratory laparotomy, omental patch repair of duodenal ulcer 6/16 Off vasopressors, less drainage from abd drain / clearing  6/17 extubated, weak cough, added high flow oxygen 6/18 start heparin gtt 6/20 d/c amiodarone 6/23 new fluid collection on CT abd > Drained by IR 6/24 To [place] 6/25 PCCM sign off 7/01 back to ICU with fever, tachypnea >> PCCM asked to reassess 7/2 comfortable this morning 7/11 large retroperitoneal hematoma-transferred back to the ICU 7/13 stable hemodynamics  Consults:  Gastroenterology General surgery Cardiology  Procedures:  Rt IJ CVL 6/14 >> 6/15 Rt radial a line 6/14 >> 6/17 ETT 6/14 >> 6/17 LUE PICC 6/16 >>   Significant Diagnostic Tests:   CT angio abd/pelvis 6/13 >> mild atherosclerosis, no significant arterial stenosis of mesenteric vasculature, no findings of  bowel ischemia, colonic diverticulosis w/o diverticulitis, b/l renal cysts, 12 mm lesion upper pole Rt kidney  ECHO 6/15 >> poor image quality, appears to be regional wall motion variability but confident wall motion analysis is impossible even after contrast administration, LVEF ~ 40-98%, Grade I diastolic dysfunction, RV systolic function is moderately reduced, RV is mildly enlarged  6/19 venous doppler LUE >> acute deep vein thrombosis involving the left axillary vein, surrounding the PICC line. Findings consistent with acute superficial vein thrombosis involving the left cephalic vein.  UGI 6/21 >> small leak from duodenum  CT ABD/Pelvis 6/23 >> interval midline abdominal incision with incomplete skin surgical closure, probable small hematomas within the anterior abdominal wall and the omentum. Nonspecific 4.3 cm air-fluid collection in the RUQ of the abdomen between the duodenal bulb and gallbladder, not containing enteric contrast. No residual leak from the duodenal bulb identified.  New small bilateral pleural effusions with dependent L>R lower lobe opacities  Upper GI series 6/30 >> Small puckered stump of a leak at the site of the prior elongated eak. Findings suggest resolving but persistent extraluminal collection.  CT abd/pelvis 7/01 >> Anterior midline surgical incision is again noted. Large irregular high density is noted in this area most consistent with enlarging subcutaneous or intramuscular hematoma.  Interval placement of pigtail drainage catheter in the area of fluid collection noted on prior exam in the right upper quadrant. Fluid collection is significantly decompressed compared to prior exam.  Micro Data:  SARS CoV2 6/13 >> negative RUQ fluid collection 6/24 >> moderate Candida albicans Blood 6/25 >>   Antimicrobials:  Zosyn  6/14 >> 7/01 Fluconazole 6/26 >> Meropenem 7/01 -7/8 Vancomycin 7/01 -7/5  Interim history/subjective:  Feels a little bit better today No  significant abdominal pain Neck pain is better  Objective   Blood pressure 133/69, pulse 87, temperature (!) 97.4 F (36.3 C), temperature source Oral, resp. rate (!) 24, height 5\' 11"  (1.803 m), weight 91.9 kg, SpO2 99 %.        Intake/Output Summary (Last 24 hours) at 11/11/2019 0943 Last data filed at 11/11/2019 0740 Gross per 24 hour  Intake 2366.82 ml  Output 1750 ml  Net 616.82 ml   Filed Weights   11/10/19 0356 11/10/19 0736 11/11/19 0500  Weight: 95.8 kg 95.8 kg 91.9 kg    Examination: General: Appears comfortable HENT: Moist oral mucosa PULM: Clear to auscultation, no crepitations CV: S1-S2 appreciated GI: Bowel sounds appreciated, drain in place MSK: normal bulk and tone Neuro: Awake and alert, following commands  Resolved Hospital Problem list   Septic shock, Acute respiratory failure with hypoxia  Assessment & Plan:  Peritonitis in setting of perforated duodenal ulcer s/p omental patch repair NPO Upper GI series on 11/03/2018- -Repeat leak test planned for 7/16  Retroperitoneal hematoma Unfortunately this developed spontaneously on heparin at therapeutic doses We cannot continue heparin He should not be restarted on heparin -This should self resorb -H/H has been stable  RUQ fluid collection s/p IR drain with candida albicans in culture Fever 7/1 Abx per primary-recently completed meropenem -On fluconazole IV  Acute systolic heart failure in setting of sepsis > improved Demand ischemia> improved History of CAD, hypertension, hyperlipidemia Tele IV metoprolol clonidine -Medications that may affect his blood pressure being held at present for hypotension  L arm DVT related to PICC Heparin was resumed on 7/3 Hemoglobin was stable up until 7/11 when it dropped acutely Found to have a retroperitoneal bleed Heparin stopped -Should not be restarted on heparin  DM2 Levemir/SSI accuchecks  Anemia of critical illness and chronic disease Acute blood  loss anemia Monitor for bleeding Transfuse PRBC for Hgb < 7 gm/dL  Moderate Malnutrition in setting of critical illness TPN  Acute blood loss anemia with hemodynamic compromise Has a retroperitoneal bleed He has remained stable  Extensive discussion with daughter, spouse and patient 7/12 Discussed with patient's other daughter at bedside today 7/13  Goals of care discussions Decision regarding DO NOT RESUSCITATE-ongoing He remains a full code at present  Continue all measures Has remained stable For drain test On 7/16 Discharge planning for possible LTAC placement  Best practice:   PER TRH   Labs   CBC: Recent Labs  Lab 11/08/19 1334 11/08/19 1334 11/08/19 2225 11/09/19 0013 11/09/19 0444 11/09/19 0540 11/09/19 1712 11/10/19 0330 11/11/19 0514  WBC 11.3*   < > 11.3*  --  SPECIMEN CONTAMINATED, UNABLE TO PERFORM TEST(S). 11.7*  --  14.1* 13.2*  NEUTROABS 9.0*  --   --   --  SPECIMEN CONTAMINATED, UNABLE TO PERFORM TEST(S). 9.5*  --   --   --   HGB 5.9*   < > 6.8*   < > SPECIMEN CONTAMINATED, UNABLE TO PERFORM TEST(S). 6.7* 9.4* 8.5* 9.4*  HCT 18.5*   < > 20.3*   < > SPECIMEN CONTAMINATED, UNABLE TO PERFORM TEST(S). 19.9* 27.4* 25.1* 28.2*  MCV 84.1   < > 85.7  --  SPECIMEN CONTAMINATED, UNABLE TO PERFORM TEST(S). 86.1  --  83.1 88.1  PLT 313   < > 209  --  SPECIMEN CONTAMINATED, UNABLE TO PERFORM  TEST(S). 234  --  223 279   < > = values in this interval not displayed.    Basic Metabolic Panel: Recent Labs  Lab 11/08/19 0353 11/08/19 0353 11/08/19 1334 11/09/19 0444 11/09/19 0540 11/10/19 0330 11/11/19 0611  NA 135   < > 135 SPECIMEN CONTAMINATED, UNABLE TO PERFORM TEST(S). 137 139 136  K 4.3   < > 4.6 SPECIMEN CONTAMINATED, UNABLE TO PERFORM TEST(S). 4.3 3.6 3.8  CL 102   < > 104 SPECIMEN CONTAMINATED, UNABLE TO PERFORM TEST(S). 103 102 103  CO2 27   < > 24 SPECIMEN CONTAMINATED, UNABLE TO PERFORM TEST(S). 26 29 26   GLUCOSE 137*   < > 183* SPECIMEN  CONTAMINATED, UNABLE TO PERFORM TEST(S). 161* 121* 97  BUN 27*   < > 33* SPECIMEN CONTAMINATED, UNABLE TO PERFORM TEST(S). 35* 34* 32*  CREATININE 0.74   < > 0.89 SPECIMEN CONTAMINATED, UNABLE TO PERFORM TEST(S). 0.76 0.72 0.66  CALCIUM 8.1*   < > 7.5* SPECIMEN CONTAMINATED, UNABLE TO PERFORM TEST(S). 7.9* 8.3* 8.3*  MG 2.1  --   --  SPECIMEN CONTAMINATED, UNABLE TO PERFORM TEST(S). 2.2 2.2 2.1  PHOS 3.8  --   --  SPECIMEN CONTAMINATED, UNABLE TO PERFORM TEST(S). 4.0 3.2 3.2   < > = values in this interval not displayed.   GFR: Estimated Creatinine Clearance: 79.6 mL/min (by C-G formula based on SCr of 0.66 mg/dL). Recent Labs  Lab 11/09/19 0444 11/09/19 0540 11/10/19 0330 11/11/19 0514  WBC SPECIMEN CONTAMINATED, UNABLE TO PERFORM TEST(S). 11.7* 14.1* 13.2*    Liver Function Tests: Recent Labs  Lab 11/05/19 0618 11/09/19 0444 11/09/19 0540  AST 64* SPECIMEN CONTAMINATED, UNABLE TO PERFORM TEST(S). 46*  ALT 68* SPECIMEN CONTAMINATED, UNABLE TO PERFORM TEST(S). 56*  ALKPHOS 72 SPECIMEN CONTAMINATED, UNABLE TO PERFORM TEST(S). 48  BILITOT 0.3 SPECIMEN CONTAMINATED, UNABLE TO PERFORM TEST(S). 0.7  PROT 6.4* SPECIMEN CONTAMINATED, UNABLE TO PERFORM TEST(S). 5.6*  ALBUMIN 1.6* SPECIMEN CONTAMINATED, UNABLE TO PERFORM TEST(S). 2.3*   No results for input(s): LIPASE, AMYLASE in the last 168 hours. No results for input(s): AMMONIA in the last 168 hours.  ABG    Component Value Date/Time   PHART 7.481 (H) 10/29/2019 2230   PCO2ART 33.9 10/29/2019 2230   PO2ART 156 (H) 10/29/2019 2230   HCO3 24.8 10/29/2019 2230   TCO2 22 10/12/2019 1717   ACIDBASEDEF 2.6 (H) 10/13/2019 0311   O2SAT 99.6 10/29/2019 2230     Coagulation Profile: Recent Labs  Lab 11/08/19 1525  INR 1.5*    Cardiac Enzymes: No results for input(s): CKTOTAL, CKMB, CKMBINDEX, TROPONINI in the last 168 hours.  HbA1C: Hgb A1c MFr Bld  Date/Time Value Ref Range Status  10/12/2019 02:30 PM 6.6 (H) 4.8 - 5.6 %  Final    Comment:    (NOTE) Pre diabetes:          5.7%-6.4%  Diabetes:              >6.4%  Glycemic control for   <7.0% adults with diabetes     CBG: Recent Labs  Lab 11/09/19 2339 11/10/19 0526 11/10/19 1645 11/10/19 2329 11/11/19 0522  GLUCAP 151* 112* 130* 110* 109*   Sherrilyn Rist, MD [place] PCCM Pager: [phone]

## 2019-11-11 NOTE — Progress Notes (Signed)
Progress Note: General Surgery Service   Chief Complaint/Subjective: No issues overnight  Objective: Vital signs in last 24 hours: Temp:  [96.8 F (36 C)-98.4 F (36.9 C)] 97.4 F (36.3 C) (07/14 0800) Pulse Rate:  [61-97] 87 (07/14 0740) Resp:  [16-30] 24 (07/14 0740) BP: (132-185)/(63-104) 133/69 (07/14 0700) SpO2:  [87 %-100 %] 99 % (07/14 0740) Weight:  [91.9 kg] 91.9 kg (07/14 0500) Last BM Date: 11/10/19  Intake/Output from previous day: 07/13 0701 - 07/14 0700 In: 2243.8 [I.V.:2159.7; IV Piggyback:84.1] Out: 1750 [Urine:1750] Intake/Output this shift: Total I/O In: 133 [I.V.:133] Out: -   Gen: NAD  Resp: nonlabored  Card: RRR  Abd: soft, open wound with clean base, minimal output from drain  Lab Results: CBC  Recent Labs    11/10/19 0330 11/11/19 0514  WBC 14.1* 13.2*  HGB 8.5* 9.4*  HCT 25.1* 28.2*  PLT 223 279   BMET Recent Labs    11/10/19 0330 11/11/19 0611  NA 139 136  K 3.6 3.8  CL 102 103  CO2 29 26  GLUCOSE 121* 97  BUN 34* 32*  CREATININE 0.72 0.66  CALCIUM 8.3* 8.3*   PT/INR Recent Labs    11/08/19 1525  LABPROT 17.7*  INR 1.5*   ABG No results for input(s): PHART, HCO3 in the last 72 hours.  Invalid input(s): PCO2, PO2  Anti-infectives: Anti-infectives (From admission, onward)   Start     Dose/Rate Route Frequency Ordered Stop   11/06/19 1800  fluconazole (DIFLUCAN) IVPB 400 mg     Discontinue     400 mg 100 mL/hr over 120 Minutes Intravenous Every 24 hours 11/05/19 1850     10/31/19 2200  vancomycin (VANCOREADY) IVPB 1250 mg/250 mL  Status:  Discontinued        1,250 mg 166.7 mL/hr over 90 Minutes Intravenous Every 12 hours 10/31/19 1602 11/03/19 1152   10/29/19 2200  meropenem (MERREM) 1 g in sodium chloride 0.9 % 100 mL IVPB        1 g 200 mL/hr over 30 Minutes Intravenous Every 8 hours 10/29/19 2008 11/05/19 2207   10/29/19 2200  vancomycin (VANCOCIN) IVPB 1000 mg/200 mL premix  Status:  Discontinued         1,000 mg 200 mL/hr over 60 Minutes Intravenous Every 12 hours 10/29/19 2008 10/31/19 1602   10/25/19 1800  fluconazole (DIFLUCAN) IVPB 400 mg  Status:  Discontinued        400 mg 100 mL/hr over 120 Minutes Intravenous Every 24 hours 10/24/19 1750 11/05/19 1850   10/24/19 1830  fluconazole (DIFLUCAN) IVPB 800 mg  Status:  Discontinued        800 mg 200 mL/hr over 120 Minutes Intravenous  Once 10/24/19 1750 10/24/19 1755   10/24/19 1830  fluconazole (DIFLUCAN) IVPB 800 mg        800 mg 100 mL/hr over 240 Minutes Intravenous Every 24 hours 10/24/19 1756 10/24/19 2240   10/12/19 2000  piperacillin-tazobactam (ZOSYN) IVPB 3.375 g  Status:  Discontinued        3.375 g 12.5 mL/hr over 240 Minutes Intravenous Every 8 hours 10/12/19 1911 10/29/19 1945   10/12/19 1708  ceFAZolin (ANCEF) 2-4 GM/100ML-% IVPB       Note to Pharmacy: Bennye Alm   : cabinet override      10/12/19 1708 10/12/19 1849      Medications: Scheduled Meds: . sodium chloride   Intravenous Once  . chlorhexidine  15 mL Mouth Rinse BID  .  Chlorhexidine Gluconate Cloth  6 each Topical Daily  . insulin aspart  0-20 Units Subcutaneous Q6H  . insulin detemir  10 Units Subcutaneous BID  . lidocaine  2 patch Transdermal Q24H  . mouth rinse  15 mL Mouth Rinse q12n4p  . metoprolol tartrate  5 mg Intravenous Q4H  . pantoprazole (PROTONIX) IV  40 mg Intravenous Q12H  . simethicone  160 mg Oral QID  . sodium chloride flush  10-40 mL Intracatheter Q12H  . sodium chloride flush  5 mL Intracatheter Q8H  . sucralfate  1 g Oral BID   Continuous Infusions: . sodium chloride Stopped (11/05/19 1544)  . sodium chloride 10 mL/hr at 11/07/19 1300  . acetaminophen    . fluconazole (DIFLUCAN) IV Stopped (11/10/19 1922)  . methocarbamol (ROBAXIN) IV Stopped (11/09/19 3016)  . norepinephrine (LEVOPHED) Adult infusion Stopped (11/08/19 1910)  . potassium chloride    . TPN ADULT (ION) 95 mL/hr at 11/11/19 0740  . TPN ADULT (ION)     PRN  Meds:.sodium chloride, acetaminophen, acetaminophen, acetaminophen, bisacodyl, fentaNYL (SUBLIMAZE) injection, hydrALAZINE, ipratropium-albuterol, lip balm, LORazepam, melatonin, methocarbamol (ROBAXIN) IV, ondansetron (ZOFRAN) IV, prochlorperazine, silver nitrate applicators, sodium chloride flush  Assessment/Plan: s/p Procedure(s): EXPLORATORY LAPAROTOMY omental patch repair perforated duodenal ulcer 10/12/2019 -continue bowel rest -continue TPN -IR study later this week -check XR of chest due to dark phlegm seen this morning    LOS: 30 days   Mickeal Skinner, MD [facility]

## 2019-11-11 NOTE — Progress Notes (Signed)
OT progress note  Patient seated in recliner upon arrival, require max A x2 to reposition as patient had slid down in chair. Patient participate in seated there ex performing both AROM exercises and with there ex, educate patient's family on exercises to encourage using theraband. Patient also participate in core strengthening sitting unsupported from recliner tolerating ~10-15 second increments x3 trials.     11/11/19 1500  OT Visit Information  Last OT Received On 11/11/19  Assistance Needed +2  PT/OT/SLP Co-Evaluation/Treatment Yes  Reason for Co-Treatment Complexity of the patient's impairments (multi-system involvement);For patient/therapist safety;To address functional/ADL transfers  OT goals addressed during session Strengthening/ROM;ADL's and self-care  History of Present Illness Pt s/p exploratory lap (10/12/19) to repair perforated duodenal ulcer and then on vent with extubation 10/14/19.  Pt with acute respiratory failure with hypoxia and acute systolic CHF in setting of sepsis and peritonitis.  Pt with hx of DM, prostate CA and bil TKR  Precautions  Precautions Fall  Precaution Comments 1 right drain--abd, may have loose BM  Pain Assessment  Pain Assessment Faces  Faces Pain Scale 4  Pain Location right knee, buttocks  Pain Descriptors / Indicators Sore;Grimacing;Guarding;Moaning  Pain Intervention(s) Monitored during session  Cognition  Arousal/Alertness Awake/alert  Behavior During Therapy Flat affect  Overall Cognitive Status Within Functional Limits for tasks assessed  General Comments patient participate in activities seated in chair however does express feeling tired and wanting to lay down  ADL  Overall ADL's  Needs assistance/impaired  Eating/Feeding Supervision/ safety;Sitting (in recliner)  Eating/Feeding Details (indicate cue type and reason) patient able to scoop ice chips onto spoon and eat from cup  Lower Body Dressing Total assistance;Sit to/from stand  Lower  Body Dressing Details (indicate cue type and reason) don socks  Bed Mobility  General bed mobility comments in recliner.  Balance  Sitting-balance support Bilateral upper extremity supported;Feet supported  Sitting balance-Leahy Scale Poor  Sitting balance - Comments patient in recliner and worked on leaning forward x 3  Transfers  General transfer comment maxi sky to chair prior to session  Exercises  Exercises General Upper Extremity;Other exercises  General Exercises - Upper Extremity  Elbow Flexion AROM;Both;5 reps;Seated;Theraband  Elbow Extension AROM;Both;5 reps;Supine;Seated  Theraband Level (Elbow Flexion) Level 1 (Yellow)  Other Exercises  Other Exercises chest pulls x5 yellow theraband seated in recliner  OT - End of Session  Activity Tolerance Patient limited by fatigue  Patient left in chair;with call bell/phone within reach;with family/visitor present  Nurse Communication Other (comment) (participation with therapy)  OT Assessment/Plan  OT Plan Discharge plan remains appropriate  OT Visit Diagnosis Muscle weakness (generalized) (M62.81);Pain  Pain - Right/Left Right  Pain - part of body Knee (buttock)  OT Frequency (ACUTE ONLY) Min 2X/week  Follow Up Recommendations SNF;Supervision/Assistance - 24 hour  OT Equipment Other (comment) (TBD)  AM-PAC OT "6 Clicks" Daily Activity Outcome Measure (Version 2)  Help from another person eating meals? 3  Help from another person taking care of personal grooming? 3  Help from another person toileting, which includes using toliet, bedpan, or urinal? 1  Help from another person bathing (including washing, rinsing, drying)? 1  Help from another person to put on and taking off regular upper body clothing? 1  Help from another person to put on and taking off regular lower body clothing? 1  6 Click Score 10  OT Goal Progression  Progress towards OT goals Progressing toward goals  Acute Rehab OT Goals  Patient Stated Goal stay  up in the chair   OT Goal Formulation With family  Time For Goal Achievement 11/25/19  Potential to Achieve Goals Good  ADL Goals  Pt Will Perform Eating sitting;with supervision  Pt Will Perform Grooming sitting;with supervision  Pt Will Perform Upper Body Bathing with min guard assist;sitting  Pt Will Perform Upper Body Dressing with min guard assist;sitting  Pt Will Transfer to Toilet with max assist;with +2 assist;squat pivot transfer;bedside commode  Pt Will Perform Toileting - Clothing Manipulation and hygiene with max assist;sit to/from stand;sitting/lateral leans  Pt/caregiver will Perform Home Exercise Program Increased strength;Both right and left upper extremity;With theraband;With minimal assist  OT Time Calculation  OT Start Time (ACUTE ONLY) 1104  OT Stop Time (ACUTE ONLY) 1127  OT Time Calculation (min) 23 min  OT General Charges  $OT Visit 1 Visit  OT Treatments  $Therapeutic Exercise 8-22 mins   [name] OT Pager: [phone]

## 2019-11-11 NOTE — Progress Notes (Signed)
PROGRESS NOTE  Matthew Schwartz MCN:[id number] DOB: 06/25/1932 DOA: 10/12/2019 PCP: [name], MD  Brief History   The patient is an 86 yr old [place] man who carries a past medical history significant for NIDDM, HLD, GERD, irritable bowel syndrome (constipation) status post left knee replacement 2009, PTCA 2000-->RCA well as prior prostate CA, former smoker. He was admitted on 10/12/2019 due to severe abdominal pain, s/p ex lap 10/12/2019 for a perforated duodenal ulcer. The patient remained on vent postoperatively. During this period of time he also was found to have NSVT postop. Echo showed Systolic HF with an EF of 40-45% with gloval left ventricular hypokinesis with disproportionately severe inferior lateral wall hypokinesis. There was also Grade I diastolic dysfunction. The patient require pressors in the ICU. Cardiology was consulted. He was extubated on 10/15/2019. On 10/21/2019 the patient had fevers that were found to be due to a postoperative abscess. IR was consulted and they placed a transcutaneous percutaneous drain on 10/22/2019. This effluent was cultures and was found to grow candida. The patient was started on diflucan. CT abdomen was repeated on 10/29/2019 due to a fever spike. It demonstrated an enlarging retroperitoneal hematoma. Unfortunately spiked a fever without cause-found to have on CT 7/1 enlarging hematoma. He has not had high-grade fever since his Antibiotics were adjusted 7/1 vancomycin and Primaxin but continues to have low-grade. Upper GI performed on 10/28/2019 demonstrated a persistent leak at the site of prior anastamosis. Fistulogram was performed on 11/06/2019 which demonstrated findings consistent with a patent fistula or connection with the previously drained abscess. General surgery felt this was minimal and not the cause for his fever. Heparin was held temporarily until 7/3 and restarted cautiously 7/3 after risk-benefit discussion with family. PCCM remains involved in his care.  Repeat fistulogram is planned on 11/13/2019.  Palliative care was consulted. They have met with the family. They have decided to keep the patient as a full code and to continue to pursue aggressive medical interventions. Palliative care will continue to follow.   Consultants   General Surgery  Interventional Radiology  PCCM  Palliative Care  Cardiology  Gastroenterology  Procedures   Rt IC CVL 6/14 - 6/15/  Rt Radial A-line 6/14 - 6/17  ETT 6/14 - 6/17  LUE PICC 10/14/2019  Percutaneous drainage 10/22/2019  Exploratory laparotomy with omental patch repair of duodenal ulcer 10/12/2019  Fistulogram 11/06/2019, 10/30/2019  Antibiotics   Anti-infectives (From admission, onward)   Start     Dose/Rate Route Frequency Ordered Stop   11/06/19 1800  fluconazole (DIFLUCAN) IVPB 400 mg     Discontinue     400 mg 100 mL/hr over 120 Minutes Intravenous Every 24 hours 11/05/19 1850     10/31/19 2200  vancomycin (VANCOREADY) IVPB 1250 mg/250 mL  Status:  Discontinued        1,250 mg 166.7 mL/hr over 90 Minutes Intravenous Every 12 hours 10/31/19 1602 11/03/19 1152   10/29/19 2200  meropenem (MERREM) 1 g in sodium chloride 0.9 % 100 mL IVPB        1 g 200 mL/hr over 30 Minutes Intravenous Every 8 hours 10/29/19 2008 11/05/19 2207   10/29/19 2200  vancomycin (VANCOCIN) IVPB 1000 mg/200 mL premix  Status:  Discontinued        1,000 mg 200 mL/hr over 60 Minutes Intravenous Every 12 hours 10/29/19 2008 10/31/19 1602   10/25/19 1800  fluconazole (DIFLUCAN) IVPB 400 mg  Status:  Discontinued        400 mg  100 mL/hr over 120 Minutes Intravenous Every 24 hours 10/24/19 1750 11/05/19 1850   10/24/19 1830  fluconazole (DIFLUCAN) IVPB 800 mg  Status:  Discontinued        800 mg 200 mL/hr over 120 Minutes Intravenous  Once 10/24/19 1750 10/24/19 1755   10/24/19 1830  fluconazole (DIFLUCAN) IVPB 800 mg        800 mg 100 mL/hr over 240 Minutes Intravenous Every 24 hours 10/24/19 1756 10/24/19  2240   10/12/19 2000  piperacillin-tazobactam (ZOSYN) IVPB 3.375 g  Status:  Discontinued        3.375 g 12.5 mL/hr over 240 Minutes Intravenous Every 8 hours 10/12/19 1911 10/29/19 1945   10/12/19 1708  ceFAZolin (ANCEF) 2-4 GM/100ML-% IVPB       Note to Pharmacy: Bennye Alm   : cabinet override      10/12/19 1708 10/12/19 1849       Subjective  The patient is sitting up in a chair at bedside. His daughter is also at bedside. No new complaints.  Objective   Vitals:  Vitals:   11/11/19 1100 11/11/19 1200  BP: 136/69 (!) 153/76  Pulse: (!) 42 85  Resp: 20 (!) 26  Temp:  99.2 F (37.3 C)  SpO2: 99% (!) 88%   Exam:  Constitutional:   The patient is awake and alert. No acute distress. Respiratory:   No increased work of breathing.  No wheezes, rales, or rhonchi  No tactile fremitus Cardiovascular:   Regular rate and rhythm  No murmurs, ectopy, or gallups.  No lateral PMI. No thrills. Abdomen:   Abdomen is distended but soft. Non-tender.  Drain present in right upper quadrant.  No hernias, masses, or organomegaly  Hypoactive bowel sounds.  Musculoskeletal:   No cyanosis, clubbing, or edema Skin:   No rashes, lesions, ulcers  palpation of skin: no induration or nodules Neurologic:   CN 2-12 intact  Sensation all 4 extremities intact  Motor 5/5 in all extremities. Psychiatric:   Mental status o Mood, affect appropriate  judgment and insight appear intact  I have personally reviewed the following:   Today's Data   Vitals, BMP, CBC  Micro Data   Blood cultures x 2  Scheduled Meds:  sodium chloride   Intravenous Once   chlorhexidine  15 mL Mouth Rinse BID   Chlorhexidine Gluconate Cloth  6 each Topical Daily   insulin aspart  0-20 Units Subcutaneous Q6H   insulin detemir  10 Units Subcutaneous BID   lidocaine  2 patch Transdermal Q24H   mouth rinse  15 mL Mouth Rinse q12n4p   metoprolol tartrate  5 mg Intravenous Q4H    pantoprazole (PROTONIX) IV  40 mg Intravenous Q12H   simethicone  160 mg Oral QID   sodium chloride flush  10-40 mL Intracatheter Q12H   sodium chloride flush  5 mL Intracatheter Q8H   sucralfate  1 g Oral BID   Continuous Infusions:  sodium chloride Stopped (11/05/19 1544)   sodium chloride 10 mL/hr at 11/07/19 1300   fluconazole (DIFLUCAN) IV Stopped (11/10/19 1922)   methocarbamol (ROBAXIN) IV Stopped (11/09/19 1950)   norepinephrine (LEVOPHED) Adult infusion Stopped (11/08/19 1910)   potassium chloride Stopped (11/11/19 1229)   TPN ADULT (ION) 95 mL/hr at 11/11/19 0740   TPN ADULT (ION)      Principal Problem:   Severe sepsis with septic shock (HCC) Active Problems:   HTN (hypertension)   Malignant neoplasm of prostate (HCC)   Hyperlipidemia  Obstipation   HLD (hyperlipidemia)   Diabetes mellitus type 2, uncontrolled, with complications (HCC)   Perforated bowel (HCC)   Perforated duodenal ulcer ([place])   Acute respiratory insufficiency   Elevated troponin level   Acute respiratory failure with hypoxia (HCC)   PVCs (premature ventricular contractions)   Bilateral atelectasis   Acute abdominal pain   Palliative care by specialist   Goals of care, counseling/discussion   General weakness   Epigastric pain   Gastric ulcer   Hypokalemia   NSVT (nonsustained ventricular tachycardia) (HCC)   Diarrhea   NSTEMI (non-ST elevated myocardial infarction) ([place])   Acute deep vein thrombosis (DVT) of left upper extremity (HCC)   Acute systolic CHF (congestive heart failure) (HCC)   Peritonitis (HCC)   Anemia of chronic disease   Hypotension   Nontraumatic retroperitoneal hematoma: 11/08/2019   Hemorrhagic shock (HCC):11/08/2019   Acute respiratory failure with hypoxemia (HCC)   Intraabdominal fluid collection   LOS: 30 days   A & P  Septic shock secondary to peritonitis due to perforated duodenal ulcer: The patient is tatus post expiratory laparotomy, omental  patch repair of duodenal ulcer per Dr. 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  Currently afebrile.  Normal white count.  Patient noted to have upper GI series 10/28/2019 showing persistent small leak with repeat upper GI series done on 11/03/2018 one of the duodenal bulb likely related to prior surgical repair.  No definite persistent leak identified on today's examination.  Patient noted to have fluid collections 10/21/2019 with drain placement with cultures positive for Candida.  Status post drain placement.  On IV fluconazole.  Patient noted to have a T-max of 102.1 on 7/1/ 2021 with a change in antibiotics from IV vancomycin(s/p 5 days) to IV Merrem.  Blood cultures obtained with no growth to date.  Currently afebrile with a normal white count.  Dr. [name] discussed with general surgery, Dr. [name] on 10/30/2019 and was felt that enlarging hematoma was not source of infection as it would drain into the midline wound.  Continue IV PPI, bowel rest, NG tube, TPN.  Patient s/p 8 days of IV Merrem.  IV of Merrem has been discontinued.  Patient afebrile.  Loose stools have decreased.  C. difficile PCR was negative.  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   Right upper quadrant drain intact.  Per general surgery likely need a repeat fistulogram this week to assess for closure. Continue IV fluconazole while drain in place as cultures were positive for Candida albicans.  Mobilize.  KUB unremarkable.  NG tube has been removed.  Will likely need SLP evaluation prior to p.o. intake.  Per IR repeat fistulogram on 11/13/2019.  General surgery recommending ongoing n.p.o. and drain for now.  Per general surgery.  IR following. Repeat fistulogram is planned for 11/13/2019.   Hypotension/hemorrhagic shock secondary to large retroperitoneal: Resolved. Patient noted to be hypotensive 1/61/0960 with systolic blood pressures in the 70s  requiring transfer to the ICU and pressors of Levophed.  CBC done noted a hemoglobin of 5.9.  CT abdomen and pelvis done consistent with a large retroperitoneal hematoma with evidence of active bleed within the right psoas muscle.  Persistent but decreased size of right rectus muscle hematoma.  Patient was transferred to the ICU and transfused 2 units of FFP, 4 units total of packed red blood cells on 11/08/2019, 11/09/2019.  Levophed subsequently titrated off.  Hemoglobin currently at  9.4 after 4 units of packed red blood cells over the past 48 hours or so.  IV heparin has been discontinued and patient no longer a candidate for anticoagulation.  Clonidine patch discontinued.  Nitroglycerin paste discontinued.  IV Lopressor dose decreased.  Follow H&H.  PCCM consulted and are following.  Acute hypoxic respiratory failure: The patient is currently saturating at 100% on room air. He is status post extubation 10/15/2019.    Acute systolic heart failure/non-STEMI/coronary artery disease 2000/NSVT: Compensated. Euvolemic.  Blood pressure initially was stable however patient now noted th to be hypotensive on 1/61/0960 with systolic blood pressures in the 70s.  Clonidine patch discontinued.  IV beta-blocker decreased to 5 mg every 4 hours.  Early on in the hospitalization patient noted to have a 5-6 beat run of nonsustained V. tach however asymptomatic on 11/04/2019.  Potassium at 4.3.  Magnesium at 2.2.  Phosphorus at 4.  IV beta-blocker dose had been increased to 10 mg every 4 hours per cardiology however due to hemorrhagic shock IV Lopressor has been decreased. If develops nonsustained V. tach may up-titrate Lopressor as tolerated by blood pressure back to 10 mg IV every 4 hours. Keep potassium > 4, keep magnesium > 2.   Left upper extremity DVT cephalic/axillary vein: Noted. Likely secondary to PICC line.  IV heparin discontinued 11/08/2019 due to hemorrhagic shock secondary to large retroperitoneal hematoma.   Follow.  Right upper extremity swelling and pain and discomfort: Heparin initially discontinued on 10/29/2019 after enlarging hematoma.  Heparin was resumed without a bolus on 10/31/2019 after Dr. [name] discussed with a specialist.  Right upper extremity discomfort initially felt to be secondary to a DVT however Dopplers negative.  Patient now with large retroperitoneal hematoma with hemorrhagic shock on 11/08/2019 and as such heparin discontinued.  Patient likely not a candidate for anticoagulation. Supportive care.    Well-controlled diabetes mellitus type 2: Hemoglobin A1c 6.6.  Blood glucose level of 112 this morning.  Patient noted not to be a diabetic prior to admission and elevated CBGs likely secondary to TPN.  Sliding scale insulin.  Anemia of critical illness/acute blood loss anemia: Status post transfusion of 2 units packed red blood cells (10/25/2019).  Patient underwent hemorrhagic shock on 11/08/2019 secondary to large retroperitoneal hematoma.  Status post transfusion of 2 units packed red blood cells 11/08/2019 and 2 units of FFP.  Hemoglobin at 6.7 on 11/09/2019 and patient transfused another 2 units of packed red blood cells hemoglobin currently at 9.4. Transfusion threshold hemoglobin  <7.   Malignancy of the prostate: Noted. Follow up as outpatient.  Tobacco abuse: Tobacco cessation.  Diarrhea: Likely due to TPN. Per RN patient with voluminous loose stools noted in rectal pouch a 6- 7 days ago which has since improved.  C. difficile PCR negative.  KUB negative.  IV antibiotics discontinued. 4 stools yesterday.  Follow.  Lower extremity pain: Secondary to retroperitoneal hematoma.  Clinical improvement with transfusion of packed red blood cells and FFP in addition to pain management.  Continue Lidoderm patch, IV Robaxin as needed, IV Tylenol.  Supportive care.  Palliative care following.   Prognosis: Patient with multiple comorbidities, complicated course during this  hospitalization.  Patient admitted with sepsis secondary to peritonitis, duodenal ulcer with perforation status post repair, complicated by non-STEMI, acute CHF.  Patient with ongoing fistula currently n.p.o. on TPN, left upper extremity DVT placed on heparin however complicated by abdominal hematoma and now with large retroperitoneal hematoma leading to hemorrhagic shock requiring pressors and  currently off pressors.  Patient transfused total of 6 units packed red blood cells during this hospitalization, as well as 2 units of FFP.  Palliative care following.  Patient with a poor prognosis.    DVT prophylaxis: SCDs Code Status: Full Family Communication: Updated patient and daughter at bedside. Disposition: The patient is from home. He is admitted as an inpatient. Anticipate discharge to [facility] or rehab. Barriers to discharge include need to continue to monitor fistula by surgery.  Status is: Inpatient    Dispo:             Patient From: [place]             Planned Disposition:               Expected discharge date: 11/27/19             Medically stable for discharge:   Patient currently not medically stable for discharge.  Patient with a hemorrhagic shock on 11/08/2019 resuscitated with blood products and had to be placed on a Levophed drip.  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  Patient in ICU.  Will downgrade to stepdown status.   Ferdinando Lodge, DO [facility] Direct contact: see [email or web address]  7PM-7AM contact night coverage as above 11/11/2019, 1:43 PM  LOS: 30 days

## 2019-11-11 NOTE — Progress Notes (Signed)
Physical Therapy Treatment Patient Details Name: Matthew Schwartz MRN: [id number] DOB: [birth year] Today's Date: 11/11/2019    History of Present Illness Pt s/p exploratory lap (10/12/19) to repair perforated duodenal ulcer and then on vent with extubation 10/14/19.  Pt with acute respiratory failure with hypoxia and acute systolic CHF in setting of sepsis and peritonitis.  Pt with hx of DM, prostate CA and bil TKR    PT Comments    Patient is in recliner via [place]  lift by nursing. Recliner  Positioned in upright, patient encouraged patient to use armrests to lean forward. Patient performed x 3 for 10 secs, 15 secs  to 30 sec sustained sitting although propped to left elbow. Patient is  Participating/Tolerating increased activity. Continue mobility efforts with PT.  Follow Up Recommendations  SNF;LTACH     Equipment Recommendations  None recommended by PT    Recommendations for Other Services       Precautions / Restrictions Precautions Precaution Comments: 1 right drain--abd, may have loose BM    Mobility  Bed Mobility               General bed mobility comments: in recliner.  Transfers                    Ambulation/Gait                 Stairs             Wheelchair Mobility    Modified Rankin (Stroke Patients Only)       Balance       Sitting balance - Comments: patient in recliner and worked on leaning forward x 3                                    Cognition   Behavior During Therapy: Flat affect                                   General Comments: patient more interactive and conversive once in sitting,  slow processing, indicates that he is tired. did participate in sitting activities.      Exercises General Exercises - Lower Extremity Long Arc Quad: AROM;Both;10 reps;Seated    General Comments        Pertinent Vitals/Pain Faces Pain Scale: Hurts little more Pain Location: right knee,  buttocks Pain Descriptors / Indicators: Sore;Grimacing;Guarding;Moaning Pain Intervention(s): Monitored during session;Premedicated before session    Home Living                      Prior Function            PT Goals (current goals can now be found in the care plan section) Progress towards PT goals: Progressing toward goals    Frequency    Min 2X/week      PT Plan Current plan remains appropriate    Co-evaluation PT/OT/SLP Co-Evaluation/Treatment: Yes            AM-PAC PT "6 Clicks" Mobility   Outcome Measure  Help needed turning from your back to your side while in a flat bed without using bedrails?: Total Help needed moving from lying on your back to sitting on the side of a flat bed without using bedrails?: Total Help needed moving to and from a bed to a chair (  including a wheelchair)?: Total Help needed standing up from a chair using your arms (e.g., wheelchair or bedside chair)?: Total Help needed to walk in hospital room?: Total Help needed climbing 3-5 steps with a railing? : Total 6 Click Score: 6    End of Session   Activity Tolerance: Patient limited by fatigue;Patient tolerated treatment well Patient left: in chair;with call bell/phone within reach;with family/visitor present;with nursing/sitter in room Nurse Communication: Mobility status;Need for lift equipment PT Visit Diagnosis: Difficulty in walking, not elsewhere classified (R26.2);Muscle weakness (generalized) (M62.81)     Time: 1107-1130 PT Time Calculation (min) (ACUTE ONLY): 23 min  Charges:  $Therapeutic Activity: 8-22 mins                     Tresa Endo PT Acute Rehabilitation Services Pager [phone] Office [phone]   Claretha Cooper 11/11/2019, 1:23 PM

## 2019-11-11 NOTE — Progress Notes (Signed)
Patient rested for a few hours before waking up. During this time I gave him pain medication as prescribed, and all other meds that were scheduled. Since that time patient has frequently called out to say he is uncomfortable but is having a hard time communicating much else. When asked about the pain he says it is "very bad" The day shift nurse gave the patient a suppository, and he has had several liquid bowel movements throughout the day. The patient has not eaten in a while and has been receiving TPN so this is not unexpected. The patient has requested I go inside of him and get it out. I don't think it is expected to find much in the bowel but because of increased distention I requested a KUB from the provider. Currently awaiting results of that study now. Will continue to assess for change in status and condition.

## 2019-11-11 NOTE — Progress Notes (Signed)
   11/11/19 0900  Clinical Encounter Type  Visited With Family  Visit Type Initial;Psychological support;Spiritual support  Referral From Nurse;Family  Consult/Referral To Chaplain  Spiritual Encounters  Spiritual Needs Emotional;Other (Comment) (Advance Directive Information )  Stress Factors  Family Stress Factors Other (Comment);Health changes;Major life changes (Advance Directive )  Advance Directives (For Healthcare)  Does Patient Have a Medical Advance Directive? No  Does patient want to make changes to medical advance directive? Yes (Inpatient - patient requests chaplain consult to change a medical advance directive)    I spoke with Mr. Frankowski daughter per referral from the [place] and nurse. I will be meeting with the family and Mr. Blanford at noon today to discuss his Advance Directive.   Chaplain Shanon Ace M.Div., [facility]

## 2019-11-11 NOTE — Progress Notes (Addendum)
PHARMACY - TOTAL PARENTERAL NUTRITION CONSULT NOTE   Indication: Prolonged ileus  Patient Measurements: Height: '5\' 11"'  (180.3 cm) Weight: 91.9 kg (202 lb 9.6 oz) IBW/kg (Calculated) : 75.3 TPN AdjBW (KG): 91.9 Body mass index is 28.26 kg/m. Usual Weight: 90.7 kg   Assessment:  Pharmacy is consulted to start TPN in 86 yo male with prolonged ileus. Pt underwent exploratory laparotomy on 6/14 which revealed perforated duodenal ulcer.   Glucose / Insulin: hx of DM - on metformin PTA.  - CBGs improved to > 100 with insulin at 50 units/TPN bag. Also remains on rSSI q6h (CBG goal range 100-150) CBG 130 - 109 - 6 units of SSI required in past 24h - Levemir 10 units BID per CCM started 6/18  Electrolytes: K+ 3.8 (target >/= 4.0) . Magnesium 2.1 (target >/= 2.0). All other lytes, including Corrected Ca, WNL.   Renal: BUN elevated, improving , SCr wnl, unchanged  LFTs / TGs: AST/ALT 46/56, decreasing; T.bili, Alk Phos WNL. TG 65 (7/5), 73 (7/12) Prealbumin / albumin: Prealbumin 8.3 (7/5), 9.6 (6/28), 9.0 (6/21), 10.9 (7/12) Albumin 1.6 (7/8), 2.3 (7/12 Intake / Output; MIVF: NS at [facility]; NG removed; 10100m UOP GI Imaging:  - 6/21 Upper GI series: small leak from superior margin of duodenal bulb. - 6/23 CT abdomen: 4.3 cm air-fluid collection in the right upper quadrant of the abdomen between the duodenal bulb and gallbladder,not containing enteric contrast. No residual leak from the duodenal bulb identified. - 7/1 Abdominal CT: large irregular high density noted most consistent with enlarging subcutaneous or IM hematoma - 7/6 Upper GI showed no leak.  Will need repeat sinogram without fistula to start oral feeds to maximize chance of this healing.   Surgeries / Procedures:  - 6/14 Exploratory laparotomy which revealed perforated duodenal ulcer - 6/24 RUQ drain placed > 10 ml > cultured - 6/25 removal R abd JP drain  - 7/2 R abd drain placed  Central access:  PICC Line 6/15 TPN start date:  6/15  Nutritional Goals: per RD recommendation on 7/6: Kcal:  2200-2400 Protein:  115-125 grams Fluid:  >2L/d  Goal TPN rate is 95 mL/hr (provides 121 g of protein and 2262 kcals per day)  Current Nutrition:  NPO   Plan:  KCl runs 136m x 4 again today  Continue TPN at 95 mL/hr at 1800, goal rate   Electrolytes in TPN:   85 mEq/L of Na, 85 mEq/L of K , 4 mEq/L of Ca, 12 mEq/L of Magnesium, and 15 mmol/L of Phos. Cl:Ac ratio max Acetate  Standard MVI and trace elements to TPN  Continue resistant q6h SSI, levemir and adjust as needed   Reduce regular insulin to 30  units/day in TPN. Aiming to prevent hypoglycemia.    30 units in TPN & 20 units Levemir daily deliver about 2 units/hr insulin  [facility]   Monitor TPN labs on Mon/Thurs  GrMinda DittoharmD 11/11/2019, 9:59 AM

## 2019-11-11 NOTE — Progress Notes (Signed)
Patient had complaints of constipation X3 days. Received suppository. Had large watery BM green with moderate amounts of blood. Patient noted to have hemorrhoids but no apparent bleeding from them. MD made aware.

## 2019-11-11 NOTE — Progress Notes (Signed)
°   11/11/19 1200  Clinical Encounter Type  Visited With Patient and family together  Visit Type Psychological support;Follow-up;Spiritual support  Referral From Family  Consult/Referral To Chaplain  Spiritual Encounters  Spiritual Needs Emotional;Other (Comment) (Advance Directive )  Stress Factors  Patient Stress Factors Health changes  Family Stress Factors Health changes;Major life changes  Advance Directives (For Healthcare)  Does Patient Have a Medical Advance Directive? No  Does patient want to make changes to medical advance directive? Yes (Inpatient - patient requests chaplain consult to change a medical advance directive)    I visited with Matthew Schwartz, his wife and daughters per referral from the family and the Matthew Schwartz, Matthew Schwartz.  I went over the Advance Directive document with them, and Matthew Schwartz told me that he was too tired to complete the document today.  Matthew Schwartz wants his wife to make his healthcare decisions if he is unable to. Matthew Schwartz and his wife have spoken about his wishes pertaining to his healthcare and she knows what he wants to be done.  Matthew Schwartz told me that he does not want life-prolonging measures to be taken if he gets to a point where he will not get better. Matthew Schwartz is also interested in getting a DNR in place.  I let the nurse know that he wishes to speak to the doctor about the DNR.  I will follow up with Matthew Schwartz and his family.   Please, contact Spiritual Care for further assistance.   Chaplain Matthew Schwartz M.Div., [facility]

## 2019-11-11 NOTE — Progress Notes (Signed)
Palliative Care Follow-up  Following patient for palliative care needs. Discussed patient with RN prior to visit- she reports earlier today that she and Dr. Grandville Silos had a Code Status discussion with family and they continue to desire FULL CODE and aggressive medical interventions. Will allow for some time and space for patient's condition to evolve and will check in regularly on readiness to have further conversation or to place limits on his care. Family aware of poor prognosis.  Lane Hacker, DO Palliative Medicine [phone]

## 2019-11-12 LAB — CBC
HCT: 29.8 % — ABNORMAL LOW (ref 39.0–52.0)
Hemoglobin: 9.3 g/dL — ABNORMAL LOW (ref 13.0–17.0)
MCH: 27.7 pg (ref 26.0–34.0)
MCHC: 31.2 g/dL (ref 30.0–36.0)
MCV: 88.7 fL (ref 80.0–100.0)
Platelets: 295 K/uL (ref 150–400)
RBC: 3.36 MIL/uL — ABNORMAL LOW (ref 4.22–5.81)
RDW: 19.3 % — ABNORMAL HIGH (ref 11.5–15.5)
WBC: 12.2 K/uL — ABNORMAL HIGH (ref 4.0–10.5)
nRBC: 0 % (ref 0.0–0.2)

## 2019-11-12 LAB — COMPREHENSIVE METABOLIC PANEL
ALT: 89 U/L — ABNORMAL HIGH (ref 0–44)
AST: 67 U/L — ABNORMAL HIGH (ref 15–41)
Albumin: 2.1 g/dL — ABNORMAL LOW (ref 3.5–5.0)
Alkaline Phosphatase: 74 U/L (ref 38–126)
Anion gap: 9 (ref 5–15)
BUN: 31 mg/dL — ABNORMAL HIGH (ref 8–23)
CO2: 26 mmol/L (ref 22–32)
Calcium: 8.2 mg/dL — ABNORMAL LOW (ref 8.9–10.3)
Chloride: 104 mmol/L (ref 98–111)
Creatinine, Ser: 0.6 mg/dL — ABNORMAL LOW (ref 0.61–1.24)
GFR calc Af Amer: >60 mL/min (ref >60)
GFR calc non Af Amer: >60 mL/min (ref >60)
Glucose, Bld: 108 mg/dL — ABNORMAL HIGH (ref 70–99)
Potassium: 3.6 mmol/L (ref 3.5–5.1)
Sodium: 139 mmol/L (ref 135–145)
Total Bilirubin: 1 mg/dL (ref 0.3–1.2)
Total Protein: 6.7 g/dL (ref 6.5–8.1)

## 2019-11-12 LAB — GLUCOSE, CAPILLARY
Glucose-Capillary: 118 mg/dL — ABNORMAL HIGH (ref 70–99)
Glucose-Capillary: 132 mg/dL — ABNORMAL HIGH (ref 70–99)
Glucose-Capillary: 133 mg/dL — ABNORMAL HIGH (ref 70–99)
Glucose-Capillary: 137 mg/dL — ABNORMAL HIGH (ref 70–99)

## 2019-11-12 LAB — BASIC METABOLIC PANEL
Anion gap: 8 (ref 5–15)
BUN: 34 mg/dL — ABNORMAL HIGH (ref 8–23)
CO2: 27 mmol/L (ref 22–32)
Calcium: 8.1 mg/dL — ABNORMAL LOW (ref 8.9–10.3)
Chloride: 105 mmol/L (ref 98–111)
Creatinine, Ser: 0.66 mg/dL (ref 0.61–1.24)
GFR calc Af Amer: >60 mL/min (ref >60)
GFR calc non Af Amer: >60 mL/min (ref >60)
Glucose, Bld: 151 mg/dL — ABNORMAL HIGH (ref 70–99)
Potassium: 4.1 mmol/L (ref 3.5–5.1)
Sodium: 140 mmol/L (ref 135–145)

## 2019-11-12 LAB — PHOSPHORUS: Phosphorus: 3.6 mg/dL (ref 2.5–4.6)

## 2019-11-12 LAB — MAGNESIUM
Magnesium: 2 mg/dL (ref 1.7–2.4)
Magnesium: 2.3 mg/dL (ref 1.7–2.4)

## 2019-11-12 MED ORDER — FENTANYL CITRATE (PF) 100 MCG/2ML IJ SOLN
INTRAMUSCULAR | Status: AC
  Filled 2019-11-12: qty 2

## 2019-11-12 MED ORDER — TRAVASOL 10 % IV SOLN
INTRAVENOUS | Status: AC
  Filled 2019-11-12: qty 1208.4

## 2019-11-12 MED ORDER — METHYLNALTREXONE BROMIDE 12 MG/0.6ML ~~LOC~~ SOLN
12.0000 mg | Freq: Once | SUBCUTANEOUS | Status: AC
  Administered 2019-11-12: 12 mg via SUBCUTANEOUS
  Filled 2019-11-12: qty 0.6

## 2019-11-12 MED ORDER — FENTANYL CITRATE (PF) 100 MCG/2ML IJ SOLN
25.0000 ug | INTRAMUSCULAR | Status: DC | PRN
  Administered 2019-11-12 – 2019-11-13 (×13): 50 ug via INTRAVENOUS
  Filled 2019-11-12 (×13): qty 2

## 2019-11-12 MED ORDER — LORAZEPAM 2 MG/ML IJ SOLN
0.5000 mg | Freq: Four times a day (QID) | INTRAMUSCULAR | Status: DC | PRN
  Administered 2019-11-12 – 2019-12-01 (×19): 0.5 mg via INTRAVENOUS
  Filled 2019-11-12 (×20): qty 1

## 2019-11-12 MED ORDER — BISACODYL 10 MG RE SUPP: 10.0000 mg | Freq: Two times a day (BID) | RECTAL | Status: DC

## 2019-11-12 MED ORDER — BISACODYL 10 MG RE SUPP
10.0000 mg | Freq: Once | RECTAL | Status: AC
  Administered 2019-11-12: 10 mg via RECTAL
  Filled 2019-11-12: qty 1

## 2019-11-12 MED ORDER — POTASSIUM CHLORIDE 10 MEQ/100ML IV SOLN
10.0000 meq | INTRAVENOUS | Status: AC
  Administered 2019-11-12 (×4): 10 meq via INTRAVENOUS
  Filled 2019-11-12 (×3): qty 100

## 2019-11-12 NOTE — Progress Notes (Signed)
NAME:  Matthew Schwartz, MRN:  [id number], DOB:  11-15-1932, LOS: 72 ADMISSION DATE:  10/12/2019, CONSULTATION DATE:  6/14 REFERRING MD:  [name], CHIEF COMPLAINT:  Abdominal pain   Brief History   86 y/o male, former smoker, presented with nausea/vomiting, abdominal pain and constipation x 4 to 5 days.  Seen twice prior to current admit for the same with imaging studies unremarkable for cause.  On return 6/14, seen by GI and found to have concern for acute abdomen and taken for exploratory laparotomy.  Found to have perforated duodenal ulcer s/p omental patch repair.  Remained on vent post op.  PCCM consulted for critical care management. Extubated on 6/17, had IR drain of abdominal fluid on 6/23. PCCM signed off on 6/25.  PCCM asked to see again for dyspnea on 7/1. He was transferred to the medical floor, anticipation was to transfer to select specialty for further long-term acute care Noted to be hypotensive-investigation so far significant for low hematocrit Large retroperitoneal hematoma  Past Medical History  Prostate cancer, HTN, GERD, HLD, DM  Significant Hospital Events   6/14 Admit, exploratory laparotomy, omental patch repair of duodenal ulcer 6/16 Off vasopressors, less drainage from abd drain / clearing  6/17 extubated, weak cough, added high flow oxygen 6/18 start heparin gtt 6/20 d/c amiodarone 6/23 new fluid collection on CT abd > Drained by IR 6/24 To [place] 6/25 PCCM sign off 7/01 back to ICU with fever, tachypnea >> PCCM asked to reassess 7/2 comfortable this morning 7/11 large retroperitoneal hematoma-transferred back to the ICU 7/13 stable hemodynamics  Consults:  Gastroenterology General surgery Cardiology  Procedures:  Rt IJ CVL 6/14 >> 6/15 Rt radial a line 6/14 >> 6/17 ETT 6/14 >> 6/17 LUE PICC 6/16 >>   Significant Diagnostic Tests:   CT angio abd/pelvis 6/13 >> mild atherosclerosis, no significant arterial stenosis of mesenteric vasculature, no findings of  bowel ischemia, colonic diverticulosis w/o diverticulitis, b/l renal cysts, 12 mm lesion upper pole Rt kidney  ECHO 6/15 >> poor image quality, appears to be regional wall motion variability but confident wall motion analysis is impossible even after contrast administration, LVEF ~ 58-52%, Grade I diastolic dysfunction, RV systolic function is moderately reduced, RV is mildly enlarged  6/19 venous doppler LUE >> acute deep vein thrombosis involving the left axillary vein, surrounding the PICC line. Findings consistent with acute superficial vein thrombosis involving the left cephalic vein.  UGI 6/21 >> small leak from duodenum  CT ABD/Pelvis 6/23 >> interval midline abdominal incision with incomplete skin surgical closure, probable small hematomas within the anterior abdominal wall and the omentum. Nonspecific 4.3 cm air-fluid collection in the RUQ of the abdomen between the duodenal bulb and gallbladder, not containing enteric contrast. No residual leak from the duodenal bulb identified.  New small bilateral pleural effusions with dependent L>R lower lobe opacities  Upper GI series 6/30 >> Small puckered stump of a leak at the site of the prior elongated eak. Findings suggest resolving but persistent extraluminal collection.  CT abd/pelvis 7/01 >> Anterior midline surgical incision is again noted. Large irregular high density is noted in this area most consistent with enlarging subcutaneous or intramuscular hematoma.  Interval placement of pigtail drainage catheter in the area of fluid collection noted on prior exam in the right upper quadrant. Fluid collection is significantly decompressed compared to prior exam.  Micro Data:  SARS CoV2 6/13 >> negative RUQ fluid collection 6/24 >> moderate Candida albicans Blood 6/25 >>   Antimicrobials:  Zosyn  6/14 >> 7/01 Fluconazole 6/26 >> Meropenem 7/01 -7/8 Vancomycin 7/01 -7/5  Interim history/subjective:  Significant abdominal pain and  discomfort Cannot get comfortable Objective   Blood pressure (!) 165/76, pulse 98, temperature (!) 100.4 F (38 C), temperature source Axillary, resp. rate (!) 21, height 5\' 11"  (1.803 m), weight 92.1 kg, SpO2 97 %.        Intake/Output Summary (Last 24 hours) at 11/12/2019 0941 Last data filed at 11/12/2019 0543 Gross per 24 hour  Intake 2241.95 ml  Output 500 ml  Net 1741.95 ml   Filed Weights   11/10/19 0736 11/11/19 0500 11/12/19 0500  Weight: 95.8 kg 91.9 kg 92.1 kg    Examination: General: Low-grade fever 100.4, uncomfortable with his abdominal discomfort HENT: Moist oral mucosa PULM: Clear to auscultation, no crepitations CV: S1-S2 appreciated GI: Bowel sounds appreciated, soft, distended MSK: normal bulk and tone Neuro: Awake and alert, following commands  X-ray with significant colonic distention  Resolved Hospital Problem list   Septic shock, Acute respiratory failure with hypoxia  Assessment & Plan:  Peritonitis in setting of perforated duodenal ulcer s/p omental patch repair NPO Upper GI series on 11/03/2018- -Repeat leak test planned for 7/16  Retroperitoneal hematoma Unfortunately this developed spontaneously on heparin at therapeutic doses We cannot continue heparin He should not be restarted on heparin -This should self resorb -H/H has been stable  Severe constipation is likely multifactorial -Opioid induced constipation may be contributing -Relistor  RUQ fluid collection s/p IR drain with candida albicans in culture Fever 7/1,7/15 Abx per primary-recently completed meropenem -On fluconazole IV  Acute systolic heart failure in setting of sepsis > improved Demand ischemia> improved History of CAD, hypertension, hyperlipidemia Tele IV metoprolol clonidine -Medications that may affect his blood pressure being held at present for hypotension  L arm DVT related to PICC Heparin was resumed on 7/3 Hemoglobin was stable up until 7/11 when it  dropped acutely Found to have a retroperitoneal bleed Heparin stopped -Should not be restarted on heparin  DM2 Levemir/SSI accuchecks  Anemia of critical illness and chronic disease Acute blood loss anemia Monitor for bleeding Transfuse PRBC for Hgb < 7 gm/dL  Moderate Malnutrition in setting of critical illness TPN  Extensive discussion with daughter, spouse and patient 7/15 -Patient is DNR  Goals of care discussions  Has remained stable For drain test On 7/16 Discharge planning for possible LTAC placement  Best practice:   PER TRH   Labs   CBC: Recent Labs  Lab 11/08/19 1334 11/08/19 2225 11/09/19 0444 11/09/19 0444 11/09/19 0540 11/09/19 1712 11/10/19 0330 11/11/19 0514 11/12/19 0455  WBC 11.3*   < > SPECIMEN CONTAMINATED, UNABLE TO PERFORM TEST(S).  --  11.7*  --  14.1* 13.2* 12.2*  NEUTROABS 9.0*  --  SPECIMEN CONTAMINATED, UNABLE TO PERFORM TEST(S).  --  9.5*  --   --   --   --   HGB 5.9*   < > SPECIMEN CONTAMINATED, UNABLE TO PERFORM TEST(S).   < > 6.7* 9.4* 8.5* 9.4* 9.3*  HCT 18.5*   < > SPECIMEN CONTAMINATED, UNABLE TO PERFORM TEST(S).   < > 19.9* 27.4* 25.1* 28.2* 29.8*  MCV 84.1   < > SPECIMEN CONTAMINATED, UNABLE TO PERFORM TEST(S).  --  86.1  --  83.1 88.1 88.7  PLT 313   < > SPECIMEN CONTAMINATED, UNABLE TO PERFORM TEST(S).  --  234  --  223 279 295   < > = values in this interval not displayed.  Basic Metabolic Panel: Recent Labs  Lab 11/09/19 0444 11/09/19 0540 11/10/19 0330 11/11/19 0611 11/12/19 0455  NA SPECIMEN CONTAMINATED, UNABLE TO PERFORM TEST(S). 137 139 136 139  K SPECIMEN CONTAMINATED, UNABLE TO PERFORM TEST(S). 4.3 3.6 3.8 3.6  CL SPECIMEN CONTAMINATED, UNABLE TO PERFORM TEST(S). 103 102 103 104  CO2 SPECIMEN CONTAMINATED, UNABLE TO PERFORM TEST(S). 26 29 26 26   GLUCOSE SPECIMEN CONTAMINATED, UNABLE TO PERFORM TEST(S). 161* 121* 97 108*  BUN SPECIMEN CONTAMINATED, UNABLE TO PERFORM TEST(S). 35* 34* 32* 31*  CREATININE  SPECIMEN CONTAMINATED, UNABLE TO PERFORM TEST(S). 0.76 0.72 0.66 0.60*  CALCIUM SPECIMEN CONTAMINATED, UNABLE TO PERFORM TEST(S). 7.9* 8.3* 8.3* 8.2*  MG SPECIMEN CONTAMINATED, UNABLE TO PERFORM TEST(S). 2.2 2.2 2.1 2.0  PHOS SPECIMEN CONTAMINATED, UNABLE TO PERFORM TEST(S). 4.0 3.2 3.2 3.6   GFR: Estimated Creatinine Clearance: 79.7 mL/min (A) (by C-G formula based on SCr of 0.6 mg/dL (L)). Recent Labs  Lab 11/09/19 0540 11/10/19 0330 11/11/19 0514 11/12/19 0455  WBC 11.7* 14.1* 13.2* 12.2*    Liver Function Tests: Recent Labs  Lab 11/09/19 0444 11/09/19 0540 11/12/19 0455  AST SPECIMEN CONTAMINATED, UNABLE TO PERFORM TEST(S). 46* 67*  ALT SPECIMEN CONTAMINATED, UNABLE TO PERFORM TEST(S). 56* 89*  ALKPHOS SPECIMEN CONTAMINATED, UNABLE TO PERFORM TEST(S). 48 74  BILITOT SPECIMEN CONTAMINATED, UNABLE TO PERFORM TEST(S). 0.7 1.0  PROT SPECIMEN CONTAMINATED, UNABLE TO PERFORM TEST(S). 5.6* 6.7  ALBUMIN SPECIMEN CONTAMINATED, UNABLE TO PERFORM TEST(S). 2.3* 2.1*   No results for input(s): LIPASE, AMYLASE in the last 168 hours. No results for input(s): AMMONIA in the last 168 hours.  ABG    Component Value Date/Time   PHART 7.481 (H) 10/29/2019 2230   PCO2ART 33.9 10/29/2019 2230   PO2ART 156 (H) 10/29/2019 2230   HCO3 24.8 10/29/2019 2230   TCO2 22 10/12/2019 1717   ACIDBASEDEF 2.6 (H) 10/13/2019 0311   O2SAT 99.6 10/29/2019 2230     Coagulation Profile: Recent Labs  Lab 11/08/19 1525  INR 1.5*    Cardiac Enzymes: No results for input(s): CKTOTAL, CKMB, CKMBINDEX, TROPONINI in the last 168 hours.  HbA1C: Hgb A1c MFr Bld  Date/Time Value Ref Range Status  10/12/2019 02:30 PM 6.6 (H) 4.8 - 5.6 % Final    Comment:    (NOTE) Pre diabetes:          5.7%-6.4%  Diabetes:              >6.4%  Glycemic control for   <7.0% adults with diabetes     CBG: Recent Labs  Lab 11/10/19 2329 11/11/19 0522 11/11/19 1726 11/11/19 2306 11/12/19 0541  GLUCAP 110* 109*  96 131* 118*   Sherrilyn Rist, MD [place] [facility] Pager: [phone]

## 2019-11-12 NOTE — Progress Notes (Signed)
PHARMACY - TOTAL PARENTERAL NUTRITION CONSULT NOTE   Indication: Prolonged ileus  Patient Measurements: Height: '5\' 11"'  (180.3 cm) Weight: 92.1 kg (203 lb 0.7 oz) IBW/kg (Calculated) : 75.3 TPN AdjBW (KG): 91.9 Body mass index is 28.32 kg/m. Usual Weight: 90.7 kg   Assessment:  Pharmacy is consulted to start TPN in 86 yo male with prolonged ileus. Pt underwent exploratory laparotomy on 6/14 which revealed perforated duodenal ulcer.   Glucose / Insulin: hx of DM - on metformin PTA.  - on rSSI q6h (CBG goal range 96  -131) - 3 units of SSI required in past 24h; Insulin in TPN reduced from 50 to 30/bag 7/14 - Levemir 10 units BID per CCM started 6/18  Electrolytes: K+ 3.6 after 4 runs yesterday(target >/= 4.0) . Magnesium 2 (target >/= 2.0). All other lytes, including Corrected Ca, WNL.   Renal: BUN elevated, improving , SCr wnl  LFTs / TGs: AST/ALT 67/89; T.bili, Alk Phos WNL. TG 65 (7/5), 73 (7/12) Prealbumin / albumin: Prealbumin 8.3 (7/5), 9.6 (6/28), 9.0 (6/21), 10.9 (7/12) Albumin 2.1 Intake / Output; MIVF: NG removed; 500 ml UOP GI Imaging:  - 6/21 Upper GI series: small leak from superior margin of duodenal bulb. - 6/23 CT abdomen: 4.3 cm air-fluid collection in the right upper quadrant of the abdomen between the duodenal bulb and gallbladder,not containing enteric contrast. No residual leak from the duodenal bulb identified. - 7/1 Abdominal CT: large irregular high density noted most consistent with enlarging subcutaneous or IM hematoma - 7/6 Upper GI showed no leak.  Will need repeat sinogram without fistula to start oral feeds to maximize chance of this healing.   Surgeries / Procedures:  - 6/14 Exploratory laparotomy which revealed perforated duodenal ulcer - 6/24 RUQ drain placed > 10 ml > cultured - 6/25 removal R abd JP drain  - 7/2 R abd drain placed  Central access:  PICC Line 6/15 TPN start date: 6/15  Nutritional Goals: per RD recommendation on 7/6: Kcal:   2200-2400 Protein:  115-125 grams Fluid:  >2L/d  Goal TPN rate is 95 mL/hr (provides 121 g of protein and 2262 kcals per day)  Current Nutrition:  NPO   Plan:  KCl runs 29mq x 4 again today  Continue TPN at 95 mL/hr at 1800, goal rate   Electrolytes in TPN:   85 mEq/L of Na, incr to 100 mEq/L of K , 4 mEq/L of Ca, 12 mEq/L of Magnesium, and 15 mmol/L of Phos. Cl:Ac ratio max Acetate  Standard MVI and trace elements to TPN  Continue resistant q6h SSI, levemir and adjust as needed   Continue regular insulin to 30  units/day in TPN. Aiming to prevent hypoglycemia.    30 units in TPN & 20 units Levemir daily deliver about 2 units/hr insulin  Monitor TPN labs on Mon/Thurs. BMET in AM   MEudelia Bunch Pharm.D 11/12/2019 9:57 AM

## 2019-11-12 NOTE — Progress Notes (Signed)
eLink Physician-Brief Progress Note Patient Name: Matthew Schwartz DOB: 04-Aug-1932 MRN: [id number]   Date of Service  11/12/2019  HPI/Events of Note  Patient reportedly having ectopy which I do not see on the rhythm strip at the moment.  eICU Interventions  Will check electrolytes.        [name] Shaquilla Kehres 11/12/2019, 11:02 PM

## 2019-11-12 NOTE — Progress Notes (Signed)
PROGRESS NOTE  Matthew Schwartz OFB:[id number] DOB: [birth year] DOA: 10/12/2019 PCP: [name], MD  Brief History   The patient is an 86 yr old [place] man who carries a past medical history significant for NIDDM, HLD, GERD, irritable bowel syndrome (constipation) status post left knee replacement 2009, PTCA 2000-->RCA well as prior prostate CA, former smoker. He was admitted on 10/12/2019 due to severe abdominal pain, s/p ex lap 10/12/2019 for a perforated duodenal ulcer. The patient remained on vent postoperatively. During this period of time he also was found to have NSVT postop. Echo showed Systolic HF with an EF of 40-45% with gloval left ventricular hypokinesis with disproportionately severe inferior lateral wall hypokinesis. There was also Grade I diastolic dysfunction. The patient require pressors in the ICU. Cardiology was consulted. He was extubated on 10/15/2019. On 10/21/2019 the patient had fevers that were found to be due to a postoperative abscess. IR was consulted and they placed a transcutaneous percutaneous drain on 10/22/2019. This effluent was cultures and was found to grow candida. The patient was started on diflucan. CT abdomen was repeated on 10/29/2019 due to a fever spike. It demonstrated an enlarging retroperitoneal hematoma. Unfortunately spiked a fever without cause-found to have on CT 7/1 enlarging hematoma. He has not had high-grade fever since his Antibiotics were adjusted 7/1 vancomycin and Primaxin but continues to have low-grade. Upper GI performed on 10/28/2019 demonstrated a persistent leak at the site of prior anastamosis. Fistulogram was performed on 11/06/2019 which demonstrated findings consistent with a patent fistula or connection with the previously drained abscess. General surgery felt this was minimal and not the cause for his fever. Heparin was held temporarily until 7/3 and restarted cautiously 7/3 after risk-benefit discussion with family. PCCM remains involved in his care.  Repeat fistulogram is planned on 11/13/2019.  Palliative care was consulted. They have met with the family. Initially the family decided to keep patient a full code and to continue to pursue aggressive measures. This morning they have decided to make the patient DNR. Palliative care will continue to follow.   Consultants  . General Surgery . Interventional Radiology . PCCM . Palliative Care . Cardiology . Gastroenterology  Procedures  . Rt IC CVL 6/14 - 6/15/ . Rt Radial A-line 6/14 - 6/17 . ETT 6/14 - 6/17 . LUE PICC 10/14/2019 . Percutaneous drainage 10/22/2019 . Exploratory laparotomy with omental patch repair of duodenal ulcer 10/12/2019 . Fistulogram 11/06/2019, 10/30/2019  Antibiotics   Anti-infectives (From admission, onward)   Start     Dose/Rate Route Frequency Ordered Stop   11/06/19 1800  fluconazole (DIFLUCAN) IVPB 400 mg     Discontinue     400 mg 100 mL/hr over 120 Minutes Intravenous Every 24 hours 11/05/19 1850     10/31/19 2200  vancomycin (VANCOREADY) IVPB 1250 mg/250 mL  Status:  Discontinued        1,250 mg 166.7 mL/hr over 90 Minutes Intravenous Every 12 hours 10/31/19 1602 11/03/19 1152   10/29/19 2200  meropenem (MERREM) 1 g in sodium chloride 0.9 % 100 mL IVPB        1 g 200 mL/hr over 30 Minutes Intravenous Every 8 hours 10/29/19 2008 11/05/19 2207   10/29/19 2200  vancomycin (VANCOCIN) IVPB 1000 mg/200 mL premix  Status:  Discontinued        1,000 mg 200 mL/hr over 60 Minutes Intravenous Every 12 hours 10/29/19 2008 10/31/19 1602   10/25/19 1800  fluconazole (DIFLUCAN) IVPB 400 mg  Status:  Discontinued  400 mg 100 mL/hr over 120 Minutes Intravenous Every 24 hours 10/24/19 1750 11/05/19 1850   10/24/19 1830  fluconazole (DIFLUCAN) IVPB 800 mg  Status:  Discontinued        800 mg 200 mL/hr over 120 Minutes Intravenous  Once 10/24/19 1750 10/24/19 1755   10/24/19 1830  fluconazole (DIFLUCAN) IVPB 800 mg        800 mg 100 mL/hr over 240 Minutes  Intravenous Every 24 hours 10/24/19 1756 10/24/19 2240   10/12/19 2000  piperacillin-tazobactam (ZOSYN) IVPB 3.375 g  Status:  Discontinued        3.375 g 12.5 mL/hr over 240 Minutes Intravenous Every 8 hours 10/12/19 1911 10/29/19 1945   10/12/19 1708  ceFAZolin (ANCEF) 2-4 GM/100ML-% IVPB       Note to Pharmacy: Bennye Alm   : cabinet override      10/12/19 1708 10/12/19 1849      Subjective  The patient is resting in bed. He is sleeping and not awakened.  Objective   Vitals:  Vitals:   11/12/19 1156 11/12/19 1300  BP: (!) 164/75   Pulse: 100   Resp: (!) 31   Temp:  99.5 F (37.5 C)  SpO2: 95%    Exam:  Constitutional:  . The patient is sleeping. Not awakened. No acute distress. Respiratory:  . No increased work of breathing. . No wheezes, rales, or rhonchi . No tactile fremitus Cardiovascular:  . Regular rate and rhythm . No murmurs, ectopy, or gallups. . No lateral PMI. No thrills. Abdomen:  . Abdomen is more distended than yesterday. Non-tender. Niel Hummer present in right upper quadrant. Scant output. . No hernias, masses, or organomegaly . Hypoactive bowel sounds.  Musculoskeletal:  . No cyanosis, clubbing, or edema Skin:  . No rashes, lesions, ulcers . palpation of skin: no induration or nodules Neurologic:  . CN 2-12 intact . Sensation all 4 extremities intact . Motor 5/5 in all extremities. Psychiatric:  . Mental status o Mood, affect appropriate . judgment and insight appear intact  I have personally reviewed the following:   Today's Data  . Vitals, BMP, CBC  Micro Data  . Blood cultures x 2 . Abdominal X-ray: Colonic ileus  Scheduled Meds: . chlorhexidine  15 mL Mouth Rinse BID  . Chlorhexidine Gluconate Cloth  6 each Topical Daily  . insulin aspart  0-20 Units Subcutaneous Q6H  . insulin detemir  10 Units Subcutaneous BID  . lidocaine  2 patch Transdermal Q24H  . mouth rinse  15 mL Mouth Rinse q12n4p  . metoprolol tartrate  5 mg  Intravenous Q4H  . pantoprazole (PROTONIX) IV  40 mg Intravenous Q12H  . simethicone  160 mg Oral QID  . sodium chloride flush  10-40 mL Intracatheter Q12H  . sodium chloride flush  5 mL Intracatheter Q8H  . sucralfate  1 g Oral BID   Continuous Infusions: . sodium chloride 10 mL/hr at 11/07/19 1300  . fluconazole (DIFLUCAN) IV Stopped (11/11/19 1941)  . methocarbamol (ROBAXIN) IV Stopped (11/09/19 0960)  . potassium chloride 10 mEq (11/12/19 1242)  . TPN ADULT (ION) 95 mL/hr at 11/12/19 0543  . TPN ADULT (ION)      Principal Problem:   Severe sepsis with septic shock (HCC) Active Problems:   HTN (hypertension)   Malignant neoplasm of prostate (HCC)   Hyperlipidemia   Obstipation   HLD (hyperlipidemia)   Diabetes mellitus type 2, uncontrolled, with complications ([place])   Perforated bowel ([place])   Perforated  duodenal ulcer (HCC)   Acute respiratory insufficiency   Elevated troponin level   Acute respiratory failure with hypoxia (HCC)   PVCs (premature ventricular contractions)   Bilateral atelectasis   Acute abdominal pain   Palliative care by specialist   Goals of care, counseling/discussion   General weakness   Epigastric pain   Gastric ulcer   Hypokalemia   NSVT (nonsustained ventricular tachycardia) (HCC)   Diarrhea   NSTEMI (non-ST elevated myocardial infarction) (HCC)   Acute deep vein thrombosis (DVT) of left upper extremity (HCC)   Acute systolic CHF (congestive heart failure) (HCC)   Peritonitis (HCC)   Anemia of chronic disease   Hypotension   Nontraumatic retroperitoneal hematoma: 11/08/2019   Hemorrhagic shock (HCC):11/08/2019   Acute respiratory failure with hypoxemia (HCC)   Intraabdominal fluid collection   LOS: 31 days   A & P  Septic shock secondary to peritonitis due to perforated duodenal ulcer: The patient is tatus post expiratory laparotomy, omental patch repair of duodenal ulcer per Dr. Magnus Ivan 10/12/2019 with postop leak/postop IR fistula tract  to the duodenal bulb.  Currently afebrile.  Normal white count.  Patient noted to have upper GI series 10/28/2019 showing persistent small leak with repeat upper GI series done on 11/03/2018 one of the duodenal bulb likely related to prior surgical repair.  No definite persistent leak identified on today's examination. Patient noted to have fluid collections 10/21/2019 with drain placement with cultures positive for Candida.  Status post drain placement. He is on IV fluconazole. Patient noted to have a T-max of 102.1 on 7/1/ 2021 with a change in antibiotics from IV vancomycin(s/p 5 days) to IV Merrem.  Blood cultures obtained with no growth to date.  Currently afebrile with a normal white count.  Dr. Mahala Menghini discussed with general surgery, Dr. Gerrit Friends on 10/30/2019 and was felt that enlarging hematoma was not source of infection as it would drain into the midline wound.  Continue IV PPI, bowel rest, NG tube, TPN.  Patient s/p 8 days of IV Merrem.  IV of Merrem has been discontinued.  Patient afebrile.  Loose stools have decreased.  C. difficile PCR was negative.  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   Right upper quadrant drain intact.  Per general surgery likely need a repeat fistulogram this week to assess for closure. Continue IV fluconazole while drain in place as cultures were positive for Candida albicans.  Mobilize.  KUB performed this morning demonstrated colonic ileus.  NG tube has been removed.  Will likely need SLP evaluation prior to p.o. intake.  Per IR repeat fistulogram on 11/13/2019.  General surgery recommending ongoing n.p.o. and drain for now.  Per general surgery.  IR following. Repeat fistulogram is planned for 11/13/2019.   Hypotension/hemorrhagic shock secondary to large retroperitoneal: Resolved. Patient noted to be hypotensive 11/08/2019 with systolic blood pressures in the 70s requiring transfer to the ICU and pressors of Levophed.  CBC done  noted a hemoglobin of 5.9.  CT abdomen and pelvis done consistent with a large retroperitoneal hematoma with evidence of active bleed within the right psoas muscle.  Persistent but decreased size of right rectus muscle hematoma.  Patient was transferred to the ICU and transfused 2 units of FFP, 4 units total of packed red blood cells on 11/08/2019, 11/09/2019.  Levophed subsequently titrated off.  Hemoglobin currently at 9.3 after 4 units of packed red blood cells over the past 48 hours or so.  IV heparin  has been discontinued and patient no longer a candidate for anticoagulation.  Clonidine patch discontinued.  Nitroglycerin paste discontinued.  IV Lopressor dose decreased.  Follow H&H.  PCCM consulted and are following.  Colonic ileus: Question role of severe constipation due to opioid use. The patient is on relistor.   Acute hypoxic respiratory failure: The patient is currently saturating at 96% on room air. He is status post extubation 10/15/2019.    Acute systolic heart failure/non-STEMI/coronary artery disease 2000/NSVT: Compensated. Euvolemic.  Blood pressure initially was stable however patient now noted th to be hypotensive on 11/08/2019 with systolic blood pressures in the 70s.  Clonidine patch discontinued.  IV beta-blocker decreased to 5 mg every 4 hours.  Early on in the hospitalization patient noted to have a 5-6 beat run of nonsustained V. tach however asymptomatic on 11/04/2019.  Potassium at 4.3.  Magnesium at 2.2.  Phosphorus at 4.  IV beta-blocker dose had been increased to 10 mg every 4 hours per cardiology however due to hemorrhagic shock IV Lopressor has been decreased. If develops nonsustained V. tach may up-titrate Lopressor as tolerated by blood pressure back to 10 mg IV every 4 hours. Keep potassium > 4, keep magnesium > 2.   Left upper extremity DVT cephalic/axillary vein: Noted. Likely secondary to PICC line.  IV heparin discontinued 11/08/2019 due to hemorrhagic shock secondary to  large retroperitoneal hematoma.  Follow.  Right upper extremity swelling and pain and discomfort: Heparin initially discontinued on 10/29/2019 after enlarging hematoma.  Heparin was resumed without a bolus on 10/31/2019 after Dr. Mahala Menghini discussed with a specialist.  Right upper extremity discomfort initially felt to be secondary to a DVT however Dopplers negative.  Patient now with large retroperitoneal hematoma with hemorrhagic shock on 11/08/2019 and as such heparin discontinued.  Patient likely not a candidate for anticoagulation. Supportive care.    Well-controlled diabetes mellitus type 2: Hemoglobin A1c 6.6.  Blood glucose level of 112 this morning.  Patient noted not to be a diabetic prior to admission and elevated CBGs likely secondary to TPN.  Sliding scale insulin. Glucoses have been running 96 - 132 in the last 24 hours.  Anemia of critical illness/acute blood loss anemia: Status post transfusion of 2 units packed red blood cells (10/25/2019).  Patient underwent hemorrhagic shock on 11/08/2019 secondary to large retroperitoneal hematoma.  Status post transfusion of 2 units packed red blood cells 11/08/2019 and 2 units of FFP.  Hemoglobin at 6.7 on 11/09/2019 and patient transfused another 2 units of packed red blood cells hemoglobin currently at 9.3. Transfusion threshold hemoglobin  <7.   Malignancy of the prostate: Noted. Follow up as outpatient.  Tobacco abuse: Tobacco cessation.  Diarrhea: Likely due to TPN. Per RN patient with voluminous loose stools noted in rectal pouch a 6- 7 days ago which has since improved.  C. difficile PCR negative.  KUB negative.  IV antibiotics discontinued. 4 stools yesterday.  Follow.  Lower extremity pain: Secondary to retroperitoneal hematoma.  Clinical improvement with transfusion of packed red blood cells and FFP in addition to pain management.  Continue Lidoderm patch, IV Robaxin as needed, IV Tylenol.  Supportive care.  Palliative care following.    Prognosis: Patient with multiple comorbidities, complicated course during this hospitalization.  Patient admitted with sepsis secondary to peritonitis, duodenal ulcer with perforation status post repair, complicated by non-STEMI, acute CHF.  Patient with ongoing fistula currently n.p.o. on TPN, left upper extremity DVT placed on heparin however complicated by abdominal hematoma and now  with large retroperitoneal hematoma leading to hemorrhagic shock requiring pressors and currently off pressors.  Patient transfused total of 6 units packed red blood cells during this hospitalization, as well as 2 units of FFP.  Palliative care following.  Patient with a poor prognosis. Family has now decided to make the patient DNR.  I have seen and examined this patient myself. I have spent 36 minutes in his evaluation and care.  DVT prophylaxis: SCDs Code Status: Full Family Communication: Updated patient and daughter at bedside. Disposition: The patient is from home. He is admitted as an inpatient. Anticipate discharge to [facility] or rehab. Barriers to discharge include need to continue to monitor fistula by surgery.  Status is: Inpatient  Dispo:             Patient From: [place]             Planned Disposition:  LTAC vs SNF             Expected discharge date: 11/27/19             Medically stable for discharge:   Patient currently not medically stable for discharge.  Patient with a hemorrhagic shock on 11/08/2019 resuscitated with blood products and had to be placed on a Levophed drip.  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  Patient in ICU.  Will downgrade to stepdown status.   Jeraldin Fesler, DO [facility] Direct contact: see [email or web address]  7PM-7AM contact night coverage as above 11/12/2019, 5:44 PM  LOS: 30 days

## 2019-11-12 NOTE — Progress Notes (Addendum)
Progress Note: General Surgery Service   Chief Complaint/Subjective: Increased abdominal distension overnight, XR showing colonic distension  Objective: Vital signs in last 24 hours: Temp:  [97.5 F (36.4 C)-100.4 F (38 C)] 100.4 F (38 C) (07/15 0800) Pulse Rate:  [42-98] 98 (07/15 0730) Resp:  [15-31] 21 (07/15 0730) BP: (123-178)/(48-110) 165/76 (07/15 0730) SpO2:  [88 %-100 %] 97 % (07/15 0730) Weight:  [92.1 kg] 92.1 kg (07/15 0500) Last BM Date: 11/12/19  Intake/Output from previous day: 07/14 0701 - 07/15 0700 In: 2375 [I.V.:2077; IV Piggyback:298] Out: 500 [Urine:500] Intake/Output this shift: No intake/output data recorded.  Gen: somnolent but arousable  Resp: nonlabored  Card: RRR  Abd: soft, distended, wound with clean base, drain with minimal output  Lab Results: CBC  Recent Labs    11/11/19 0514 11/12/19 0455  WBC 13.2* 12.2*  HGB 9.4* 9.3*  HCT 28.2* 29.8*  PLT 279 295   BMET Recent Labs    11/11/19 0611 11/12/19 0455  NA 136 139  K 3.8 3.6  CL 103 104  CO2 26 26  GLUCOSE 97 108*  BUN 32* 31*  CREATININE 0.66 0.60*  CALCIUM 8.3* 8.2*   PT/INR No results for input(s): LABPROT, INR in the last 72 hours. ABG No results for input(s): PHART, HCO3 in the last 72 hours.  Invalid input(s): PCO2, PO2  Anti-infectives: Anti-infectives (From admission, onward)   Start     Dose/Rate Route Frequency Ordered Stop   11/06/19 1800  fluconazole (DIFLUCAN) IVPB 400 mg     Discontinue     400 mg 100 mL/hr over 120 Minutes Intravenous Every 24 hours 11/05/19 1850     10/31/19 2200  vancomycin (VANCOREADY) IVPB 1250 mg/250 mL  Status:  Discontinued        1,250 mg 166.7 mL/hr over 90 Minutes Intravenous Every 12 hours 10/31/19 1602 11/03/19 1152   10/29/19 2200  meropenem (MERREM) 1 g in sodium chloride 0.9 % 100 mL IVPB        1 g 200 mL/hr over 30 Minutes Intravenous Every 8 hours 10/29/19 2008 11/05/19 2207   10/29/19 2200  vancomycin  (VANCOCIN) IVPB 1000 mg/200 mL premix  Status:  Discontinued        1,000 mg 200 mL/hr over 60 Minutes Intravenous Every 12 hours 10/29/19 2008 10/31/19 1602   10/25/19 1800  fluconazole (DIFLUCAN) IVPB 400 mg  Status:  Discontinued        400 mg 100 mL/hr over 120 Minutes Intravenous Every 24 hours 10/24/19 1750 11/05/19 1850   10/24/19 1830  fluconazole (DIFLUCAN) IVPB 800 mg  Status:  Discontinued        800 mg 200 mL/hr over 120 Minutes Intravenous  Once 10/24/19 1750 10/24/19 1755   10/24/19 1830  fluconazole (DIFLUCAN) IVPB 800 mg        800 mg 100 mL/hr over 240 Minutes Intravenous Every 24 hours 10/24/19 1756 10/24/19 2240   10/12/19 2000  piperacillin-tazobactam (ZOSYN) IVPB 3.375 g  Status:  Discontinued        3.375 g 12.5 mL/hr over 240 Minutes Intravenous Every 8 hours 10/12/19 1911 10/29/19 1945   10/12/19 1708  ceFAZolin (ANCEF) 2-4 GM/100ML-% IVPB       Note to Pharmacy: Bennye Alm   : cabinet override      10/12/19 1708 10/12/19 1849      Medications: Scheduled Meds: . sodium chloride   Intravenous Once  . chlorhexidine  15 mL Mouth Rinse BID  . Chlorhexidine  Gluconate Cloth  6 each Topical Daily  . insulin aspart  0-20 Units Subcutaneous Q6H  . insulin detemir  10 Units Subcutaneous BID  . lidocaine  2 patch Transdermal Q24H  . mouth rinse  15 mL Mouth Rinse q12n4p  . metoprolol tartrate  5 mg Intravenous Q4H  . pantoprazole (PROTONIX) IV  40 mg Intravenous Q12H  . simethicone  160 mg Oral QID  . sodium chloride flush  10-40 mL Intracatheter Q12H  . sodium chloride flush  5 mL Intracatheter Q8H  . sucralfate  1 g Oral BID   Continuous Infusions: . sodium chloride Stopped (11/05/19 1544)  . sodium chloride 10 mL/hr at 11/07/19 1300  . fluconazole (DIFLUCAN) IV Stopped (11/11/19 1941)  . methocarbamol (ROBAXIN) IV Stopped (11/09/19 6579)  . norepinephrine (LEVOPHED) Adult infusion Stopped (11/08/19 1910)  . TPN ADULT (ION) 95 mL/hr at 11/12/19 0543    PRN Meds:.sodium chloride, acetaminophen, acetaminophen, bisacodyl, fentaNYL (SUBLIMAZE) injection, hydrALAZINE, ipratropium-albuterol, lip balm, LORazepam, melatonin, methocarbamol (ROBAXIN) IV, ondansetron (ZOFRAN) IV, prochlorperazine, silver nitrate applicators, sodium chloride flush  Assessment/Plan: s/p Procedure(s): EXPLORATORY LAPAROTOMY omental patch repair perforated duodenal ulcer 10/12/2019  Graham's patch repair of pyloric channel ulcer, study last week concerning for leak -continue bowel rest -continue TPN -repeat study per IR  Colonic distension - responded to suppository overnight, possibly related to irration from hematoma. -recommend scheduled suppositories and serial imaging -concerned that neostigmine or colonoscopy could worsen other problems, will discuss with team   LOS: 31 days   Mickeal Skinner, MD [facility]

## 2019-11-13 ENCOUNTER — Inpatient Hospital Stay (HOSPITAL_COMMUNITY): Payer: Medicare Other

## 2019-11-13 HISTORY — PX: IR SINUS/FIST TUBE CHK-NON GI: IMG673

## 2019-11-13 LAB — BASIC METABOLIC PANEL
Anion gap: 6 (ref 5–15)
BUN: 31 mg/dL — ABNORMAL HIGH (ref 8–23)
CO2: 26 mmol/L (ref 22–32)
Calcium: 8.2 mg/dL — ABNORMAL LOW (ref 8.9–10.3)
Chloride: 105 mmol/L (ref 98–111)
Creatinine, Ser: 0.59 mg/dL — ABNORMAL LOW (ref 0.61–1.24)
GFR calc Af Amer: >60 mL/min (ref >60)
GFR calc non Af Amer: >60 mL/min (ref >60)
Glucose, Bld: 139 mg/dL — ABNORMAL HIGH (ref 70–99)
Potassium: 3.7 mmol/L (ref 3.5–5.1)
Sodium: 137 mmol/L (ref 135–145)

## 2019-11-13 LAB — PHOSPHORUS: Phosphorus: 3.2 mg/dL (ref 2.5–4.6)

## 2019-11-13 LAB — GLUCOSE, CAPILLARY
Glucose-Capillary: 129 mg/dL — ABNORMAL HIGH (ref 70–99)
Glucose-Capillary: 140 mg/dL — ABNORMAL HIGH (ref 70–99)
Glucose-Capillary: 128 mg/dL — ABNORMAL HIGH (ref 70–99)
Glucose-Capillary: 136 mg/dL — ABNORMAL HIGH (ref 70–99)

## 2019-11-13 LAB — MAGNESIUM: Magnesium: 2.1 mg/dL (ref 1.7–2.4)

## 2019-11-13 MED ORDER — METOPROLOL TARTRATE 5 MG/5ML IV SOLN
5.0000 mg | Freq: Three times a day (TID) | INTRAVENOUS | Status: DC
  Administered 2019-11-13 – 2019-11-18 (×15): 5 mg via INTRAVENOUS
  Filled 2019-11-13 (×15): qty 5

## 2019-11-13 MED ORDER — INSULIN ASPART 100 UNIT/ML ~~LOC~~ SOLN
0.0000 [IU] | SUBCUTANEOUS | Status: DC
  Administered 2019-11-13 – 2019-11-14 (×5): 1 [IU] via SUBCUTANEOUS
  Administered 2019-11-14: 2 [IU] via SUBCUTANEOUS
  Administered 2019-11-14 – 2019-11-15 (×2): 1 [IU] via SUBCUTANEOUS

## 2019-11-13 MED ORDER — POTASSIUM CHLORIDE 10 MEQ/100ML IV SOLN
10.0000 meq | INTRAVENOUS | Status: AC
  Administered 2019-11-13 (×4): 10 meq via INTRAVENOUS
  Filled 2019-11-13 (×4): qty 100

## 2019-11-13 MED ORDER — BISACODYL 10 MG RE SUPP
10.0000 mg | Freq: Every day | RECTAL | Status: DC | PRN
  Administered 2019-11-13 – 2019-11-17 (×2): 10 mg via RECTAL
  Filled 2019-11-13 (×3): qty 1

## 2019-11-13 MED ORDER — PANTOPRAZOLE SODIUM 40 MG IV SOLR
40.0000 mg | INTRAVENOUS | Status: DC
  Administered 2019-11-14 – 2019-11-22 (×9): 40 mg via INTRAVENOUS
  Filled 2019-11-13 (×8): qty 40

## 2019-11-13 MED ORDER — IOHEXOL 300 MG/ML  SOLN
50.0000 mL | Freq: Once | INTRAMUSCULAR | Status: AC | PRN
  Administered 2019-11-13: 10 mL

## 2019-11-13 MED ORDER — INSULIN DETEMIR 100 UNIT/ML ~~LOC~~ SOLN
10.0000 [IU] | Freq: Every day | SUBCUTANEOUS | Status: DC
  Administered 2019-11-14 – 2019-11-16 (×3): 10 [IU] via SUBCUTANEOUS
  Filled 2019-11-13 (×3): qty 0.1

## 2019-11-13 MED ORDER — HYDROMORPHONE HCL 1 MG/ML IJ SOLN
0.5000 mg | INTRAMUSCULAR | Status: DC | PRN
  Administered 2019-11-13 – 2019-11-14 (×2): 1 mg via INTRAVENOUS
  Administered 2019-11-14: 0.5 mg via INTRAVENOUS
  Administered 2019-11-14 – 2019-11-17 (×17): 1 mg via INTRAVENOUS
  Filled 2019-11-13 (×21): qty 1

## 2019-11-13 MED ORDER — TRAVASOL 10 % IV SOLN
INTRAVENOUS | Status: AC
  Filled 2019-11-13: qty 1208.4

## 2019-11-13 MED ORDER — HYDRALAZINE HCL 20 MG/ML IJ SOLN
10.0000 mg | INTRAMUSCULAR | Status: DC | PRN
  Administered 2019-11-13: 10 mg via INTRAVENOUS
  Filled 2019-11-13: qty 1

## 2019-11-13 MED ORDER — FLUCONAZOLE IN SODIUM CHLORIDE 400-0.9 MG/200ML-% IV SOLN
400.0000 mg | INTRAVENOUS | Status: DC
  Administered 2019-11-13 – 2019-11-18 (×6): 400 mg via INTRAVENOUS
  Filled 2019-11-13 (×7): qty 200

## 2019-11-13 MED ORDER — IPRATROPIUM-ALBUTEROL 0.5-2.5 (3) MG/3ML IN SOLN: 3.0000 mL | RESPIRATORY_TRACT | Status: DC | PRN

## 2019-11-13 MED ORDER — HYDRALAZINE HCL 20 MG/ML IJ SOLN
10.0000 mg | Freq: Four times a day (QID) | INTRAMUSCULAR | Status: DC | PRN
  Administered 2019-11-13 – 2019-11-16 (×3): 10 mg via INTRAVENOUS
  Filled 2019-11-13 (×3): qty 1

## 2019-11-13 NOTE — Progress Notes (Signed)
Chart reviewed No acute critical care issues noted.  We will sign off  Erick Colace ACNP-BC [place] Pager # [phone] OR # [phone] if no answer

## 2019-11-13 NOTE — Progress Notes (Signed)
PHARMACY - TOTAL PARENTERAL NUTRITION CONSULT NOTE   Indication: Prolonged ileus  Patient Measurements: Height: '5\' 11"'  (180.3 cm) Weight: 94.5 kg (208 lb 5.4 oz) IBW/kg (Calculated) : 75.3 TPN AdjBW (KG): 91.9 Body mass index is 29.06 kg/m. Usual Weight: 90.7 kg   Assessment:  Pharmacy is consulted to start TPN in 86 yo male with prolonged ileus. Pt underwent exploratory laparotomy on 6/14 which revealed perforated duodenal ulcer.   Glucose / Insulin: hx of DM - on metformin PTA.  - on rSSI q6h (CBG range 129-151) - 6 units of SSI required in past 24h; Insulin in TPN reduced from 50 to 30/bag 7/14 - Levemir 10 units BID per CCM started 6/18  Electrolytes: K+ 3.7 after 4 runs yesterday(target >/= 4.0) . Magnesium 2.1 (target >/= 2.0). All other lytes, including Corrected Ca, WNL.   Renal: BUN elevated, improving , SCr wnl  LFTs / TGs: AST/ALT 67/89; T.bili, Alk Phos WNL. TG 65 (7/5), 73 (7/12) Prealbumin / albumin: Prealbumin 8.3 (7/5), 9.6 (6/28), 9.0 (6/21), 10.9 (7/12) Albumin 2.1 Intake / Output; MIVF: NG removed; 1500 ml UOP GI Imaging:  - 6/21 Upper GI series: small leak from superior margin of duodenal bulb. - 6/23 CT abdomen: 4.3 cm air-fluid collection in the right upper quadrant of the abdomen between the duodenal bulb and gallbladder,not containing enteric contrast. No residual leak from the duodenal bulb identified. - 7/1 Abdominal CT: large irregular high density noted most consistent with enlarging subcutaneous or IM hematoma - 7/6 Upper GI showed no leak.  Will need repeat sinogram without fistula to start oral feeds to maximize chance of this healing.   Surgeries / Procedures:  - 6/14 Exploratory laparotomy which revealed perforated duodenal ulcer - 6/24 RUQ drain placed > 10 ml > cultured - 6/25 removal R abd JP drain  - 7/2 R abd drain placed  Central access:  PICC Line 6/15 TPN start date: 6/15  Nutritional Goals: per RD recommendation on 7/6: Kcal:   2200-2400 Protein:  115-125 grams Fluid:  >2L/d  Goal TPN rate is 95 mL/hr (provides 121 g of protein and 2262 kcals per day)  Current Nutrition:  NPO   Plan:  KCl runs 49mq x 4 again today  Continue TPN at 95 mL/hr at 1800, goal rate   Electrolytes in TPN:   85 mEq/L of Na, 7/15 increased to 100 mEq/L of K , 4 mEq/L of Ca, 12 mEq/L of Magnesium, and 15 mmol/L of Phos. Cl:Ac ratio max Acetate  Standard MVI and trace elements to TPN  Continue resistant q6h SSI, levemir and adjust as needed   Continue regular insulin to 30  units/day in TPN. Aiming to prevent hypoglycemia.    30 units in TPN & 20 units Levemir daily deliver about 2 units/hr insulin  Monitor TPN labs on Mon/Thurs. CMET, Mg, phos in AM  SUlice Dash PharmD, BCPS    11/13/2019 9:09 AM

## 2019-11-13 NOTE — Progress Notes (Signed)
Palliative Care Family Meeting Goals of Care   Met with patient, his wife Matthew Schwartz and his daughter Matthew Schwartz to discuss goals of care. Matthew Schwartz is explicitly stating and confirming his choice to stop aggressive medical interventions and go home- to shift medical interventions to comfort care and supportive care interventions. He is deteriorating and this is a window where he is still alert and would benefit from going home with hospice services.  1. DNR 2. Home with hospice care- I will start helping get that all set up and arranged tomorrow with a goal of going home on Monday- to minimize distress of another move will keep him in ICU until we transport him. 3. Comfort feeding as tolerated-regardless of fistulogram results. 4. Pain control- no more or no less than what he needs for comfort. 5. Will begin to minimize his medication list 6. Will ask CCM to help-they also need a referral for in home private duty nursing care.  Family present today asking that surgery and ccm doctors endorse comfort care and acknowledge the decline and the patient's autonomy with their sister Matthew Schwartz who is present most mornings and could not attend today's meeting. Will see him tomorrow and move this plan forward.  Lane Hacker, DO Palliative Medicine  Time: 50 minutes Greater than 50%  of this time was spent counseling and coordinating care related to the above assessment and plan.

## 2019-11-13 NOTE — Progress Notes (Signed)
Family stating concerns about current plans of hospice vs comfort care. Per family, Monday was understanding for Hospice care to begin taking place instead of initating comfort care at this time as they needed to have discussion with other sister. RN paged Swayze MD and is aware of situation.

## 2019-11-13 NOTE — Progress Notes (Signed)
Physical Therapy Treatment Patient Details Name: Matthew Schwartz MRN: [id number] DOB: 18-Jan-1933 Today's Date: 11/13/2019    History of Present Illness Pt s/p exploratory lap (10/12/19) to repair perforated duodenal ulcer and then on vent with extubation 10/14/19.  Pt with acute respiratory failure with hypoxia and acute systolic CHF in setting of sepsis and peritonitis.  Pt with hx of DM, prostate CA and bil TKR    PT Comments    Assisted  Patient to sitting on bed edge, requiring 2 total assist. Patient is very weak, decreased sitting balanc, leaning more to the left. Patient indicates feeling weak and requests to return to supine with +2 total assist. Continue PT efforts.  Follow Up Recommendations  SNF;LTACH     Equipment Recommendations  None recommended by PT    Recommendations for Other Services       Precautions / Restrictions Precautions Precautions: Fall Precaution Comments: 1 right drain--abd, may have loose BM    Mobility  Bed Mobility Overal bed mobility: Needs Assistance Bed Mobility: Rolling;Sidelying to Sit;Sit to Sidelying Rolling: Max assist;+2 for physical assistance;+2 for safety/equipment Sidelying to sit: +2 for physical assistance;+2 for safety/equipment;Total assist     Sit to sidelying: +2 for physical assistance;Total assist General bed mobility comments: patient requires really total assistance for mobility, very weak effort from patient.  Transfers                 General transfer comment: will need lift equipment  Ambulation/Gait                 Stairs             Wheelchair Mobility    Modified Rankin (Stroke Patients Only)       Balance   Sitting-balance support: Bilateral upper extremity supported;Feet supported Sitting balance-Leahy Scale: Poor Sitting balance - Comments: listing to the left, multimodal cues to attempt to move trunk to near midline, requires external support Postural control: Left lateral lean                                   Cognition Arousal/Alertness: Awake/alert Behavior During Therapy: Flat affect                                   General Comments: patient did participate in mobility with encouragement      Exercises      General Comments        Pertinent Vitals/Pain Pain Assessment: Faces Faces Pain Scale: Hurts little more Pain Location: nonspecific Pain Descriptors / Indicators: Sore;Grimacing;Guarding;Moaning Pain Intervention(s): Monitored during session;Premedicated before session    Home Living                      Prior Function            PT Goals (current goals can now be found in the care plan section) Progress towards PT goals: Not progressing toward goals - comment (progressively weaker, decreased efforts with mobility)    Frequency    Min 2X/week      PT Plan Current plan remains appropriate    Co-evaluation PT/OT/SLP Co-Evaluation/Treatment: Yes Reason for Co-Treatment: Complexity of the patient's impairments (multi-system involvement);For patient/therapist safety PT goals addressed during session: Mobility/safety with mobility OT goals addressed during session: ADL's and self-care      AM-PAC PT "  6 Clicks" Mobility   Outcome Measure  Help needed turning from your back to your side while in a flat bed without using bedrails?: Total Help needed moving from lying on your back to sitting on the side of a flat bed without using bedrails?: Total Help needed moving to and from a bed to a chair (including a wheelchair)?: Total Help needed standing up from a chair using your arms (e.g., wheelchair or bedside chair)?: Total Help needed to walk in hospital room?: Total Help needed climbing 3-5 steps with a railing? : Total 6 Click Score: 6    End of Session   Activity Tolerance: Patient limited by fatigue Patient left: in bed;with call bell/phone within reach;with family/visitor present Nurse  Communication: Mobility status;Need for lift equipment PT Visit Diagnosis: Difficulty in walking, not elsewhere classified (R26.2);Muscle weakness (generalized) (M62.81)     Time: 1015-1050 PT Time Calculation (min) (ACUTE ONLY): 35 min  Charges:  $Therapeutic Activity: 8-22 mins                     Tresa Endo PT Acute Rehabilitation Services Pager [phone] Office [phone]    Claretha Cooper 11/13/2019, 12:37 PM

## 2019-11-13 NOTE — Progress Notes (Signed)
Family requesting to have final meeting with palliative to involve both sisters, wife and patient. Requested to meet at 1530 if possible in order for everyone to be present to discuss goals from todays palliative meeting.

## 2019-11-13 NOTE — Progress Notes (Signed)
Nutrition Follow-up  DOCUMENTATION CODES:   Not applicable  INTERVENTION:  - continue TPN per Pharmacist.   NUTRITION DIAGNOSIS:   Inadequate oral intake related to inability to eat as evidenced by NPO status. -ongoing  GOAL:   Patient will meet greater than or equal to 90% of their needs -met with TPN regimen  MONITOR:   Diet advancement, Labs, Weight trends, Other (Comment) (TPN regimen)  ASSESSMENT:   Pt admitted with obstipation. PMH includes prostate cancer s/p radiation, HTN, HLD, GERD, T2DM.  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 He remains NPO. Weight trended down 7/14 and 7/15 but now back up and consistent with weight 7/12 and 7/13.  CCM is signing off on patient's care as of today. Plan to repeat leak study/fistulogram today.  Patient to remain NPO. He is receiving custom TPN at goal rate of 95 ml/hr which is providing 2262 kcal, 121 grams protein to meet 100% estimated needs.     Labs reviewed; CBG: 129 mg/dl, BUN: 31 mg/dl, creatinine: 0.59 mg/dl, Ca: 8.2 mg/dl. Medications reviewed; sliding scale novolog, 10 units levemir BID, 40 mg IV protonix BID, 10 mEq IV KCl x4 runs 7/16, 1 g carafate BID.    Diet Order:   Diet Order            Diet NPO time specified Except for: Ice Chips  Diet effective now                 EDUCATION NEEDS:   Not appropriate for education at this time  Skin:  Skin Assessment: Skin Integrity  Issues: Skin Integrity Issues:: Incisions Incisions: abdomen (6/14)  Last BM:  7/16  Height:   Ht Readings from Last 1 Encounters:  11/08/19 '5\' 11"'  (1.803 m)    Weight:   Wt Readings from Last 1 Encounters:  11/13/19 94.5 kg     Estimated Nutritional Needs:  Kcal:  2200-2400 Protein:  115-125 grams Fluid:  >2L/d     Matthew Matin, MS, RD, LDN, CNSC Inpatient Clinical Dietitian RD pager # available in [place]  After hours/weekend pager # available in [facility]

## 2019-11-13 NOTE — Progress Notes (Signed)
Per family, please direct calls about moving forward with hospice care to [facility].

## 2019-11-13 NOTE — Progress Notes (Signed)
RN notified from central telemetry of runs of v-tach 10-12 beats. RN notified MD due to prolong runs. EKG obtained and scheduled metoprolol administered. No new additional orders at this time

## 2019-11-13 NOTE — Progress Notes (Signed)
eLink Physician-Brief Progress Note Patient Name: Matthew Schwartz DOB: [birth year] MRN: [id number]   Date of Service  11/13/2019  HPI/Events of Note  Blood pressure of 169/71 which exceeds goal BP of < 140 mmHg  eICU Interventions  Hydralazine dose changed to 10-20 mg Q 4 hours.        [name] [name] 11/13/2019, 3:41 AM

## 2019-11-13 NOTE — Progress Notes (Signed)
Occupational Therapy Treatment Patient Details Name: Matthew Schwartz MRN: [id number] DOB: Feb 10, 1933 Today's Date: 11/13/2019    History of present illness Pt s/p exploratory lap (10/12/19) to repair perforated duodenal ulcer and then on vent with extubation 10/14/19.  Pt with acute respiratory failure with hypoxia and acute systolic CHF in setting of sepsis and peritonitis.  Pt with hx of DM, prostate CA and bil TKR   OT comments  Patient requiring max to total A x2 to sitting edge of bed. Patient very weak with decreased activity tolerance, requiring mod to max assist and max cues to initiate weight shifting due to L lateral lean. Attempted chair position in bed however patient reports pain in LEs and requests to return to semi-supine position at end of session. Will continue to follow.   Follow Up Recommendations  SNF;Supervision/Assistance - 24 hour    Equipment Recommendations  Other (comment) (TBD)       Precautions / Restrictions Precautions Precautions: Fall Precaution Comments: 1 right drain--abd, may have loose BM       Mobility Bed Mobility Overal bed mobility: Needs Assistance Bed Mobility: Rolling;Sidelying to Sit;Sit to Sidelying Rolling: Max assist;+2 for physical assistance;+2 for safety/equipment Sidelying to sit: +2 for physical assistance;+2 for safety/equipment;Total assist     Sit to sidelying: +2 for physical assistance;Total assist General bed mobility comments: patient will initiate laying himself back into bed with trunk, otherwise total A   Transfers                 General transfer comment: will need lift equipment    Balance Overall balance assessment: Needs assistance Sitting-balance support: Bilateral upper extremity supported;Feet supported Sitting balance-Leahy Scale: Poor Sitting balance - Comments: listing to the left, multimodal cues to attempt to move trunk to near midline, requires external support Postural control: Left lateral lean                                  ADL either performed or assessed with clinical judgement   ADL                                         General ADL Comments: worked on weight shifting EOB to correct posture and maintain midline balance in preparation for self care tasks, patient requiring mod A to correct               Cognition Arousal/Alertness: Awake/alert Behavior During Therapy: Flat affect Overall Cognitive Status: Within Functional Limits for tasks assessed                                 General Comments: patient participates with encouragement however limited conversation/verbal responses with therapists and needs increased time to initiate                   Pertinent Vitals/ Pain       Pain Assessment: Faces Faces Pain Scale: Hurts little more Pain Location: legs Pain Descriptors / Indicators: Sore;Grimacing;Guarding;Moaning Pain Intervention(s): Repositioned;Monitored during session         Frequency  Min 2X/week        Progress Toward Goals  OT Goals(current goals can now be found in the care plan section)  Progress towards OT goals: Progressing toward goals  Acute Rehab OT Goals Patient Stated Goal: "I want to lay down" OT Goal Formulation: With patient Time For Goal Achievement: 11/25/19 Potential to Achieve Goals: Good ADL Goals Pt Will Perform Eating: sitting;with supervision Pt Will Perform Grooming: sitting;with supervision Pt Will Perform Upper Body Bathing: with min guard assist;sitting Pt Will Perform Upper Body Dressing: with min guard assist;sitting Pt Will Transfer to Toilet: with max assist;with +2 assist;squat pivot transfer;bedside commode Pt Will Perform Toileting - Clothing Manipulation and hygiene: with max assist;sit to/from stand;sitting/lateral leans Pt/caregiver will Perform Home Exercise Program: Increased strength;Both right and left upper extremity;With theraband;With minimal  assist  Plan Discharge plan remains appropriate    Co-evaluation    PT/OT/SLP Co-Evaluation/Treatment: Yes Reason for Co-Treatment: Complexity of the patient's impairments (multi-system involvement);For patient/therapist safety;To address functional/ADL transfers PT goals addressed during session: Mobility/safety with mobility OT goals addressed during session: ADL's and self-care      AM-PAC OT "6 Clicks" Daily Activity     Outcome Measure   Help from another person eating meals?: A Little Help from another person taking care of personal grooming?: A Little Help from another person toileting, which includes using toliet, bedpan, or urinal?: Total Help from another person bathing (including washing, rinsing, drying)?: Total Help from another person to put on and taking off regular upper body clothing?: Total Help from another person to put on and taking off regular lower body clothing?: Total 6 Click Score: 10    End of Session  OT Visit Diagnosis: Muscle weakness (generalized) (M62.81);Pain Pain - Right/Left:  (bilateral) Pain - part of body: Leg   Activity Tolerance Patient limited by fatigue   Patient Left in bed;with call bell/phone within reach   Nurse Communication Mobility status        Time: 3614-4315 OT Time Calculation (min): 25 min  Charges: OT General Charges $OT Visit: 1 Visit OT Treatments $Self Care/Home Management : 8-22 mins  [name] OT Pager: [place] 11/13/2019, 2:51 PM

## 2019-11-13 NOTE — Progress Notes (Addendum)
PROGRESS NOTE  Matthew Schwartz OFB:[id number] DOB: 08-19-1932 DOA: 10/12/2019 PCP: [name], MD  Brief History   The patient is an 86 yr old [place] man who carries a past medical history significant for NIDDM, HLD, GERD, irritable bowel syndrome (constipation) status post left knee replacement 2009, PTCA 2000-->RCA well as prior prostate CA, former smoker. He was admitted on 10/12/2019 due to severe abdominal pain, s/p ex lap 10/12/2019 for a perforated duodenal ulcer. The patient remained on vent postoperatively. During this period of time he also was found to have NSVT postop. Echo showed Systolic HF with an EF of 40-45% with gloval left ventricular hypokinesis with disproportionately severe inferior lateral wall hypokinesis. There was also Grade I diastolic dysfunction. The patient require pressors in the ICU. Cardiology was consulted. He was extubated on 10/15/2019. On 10/21/2019 the patient had fevers that were found to be due to a postoperative abscess. IR was consulted and they placed a transcutaneous percutaneous drain on 10/22/2019. This effluent was cultures and was found to grow candida. The patient was started on diflucan. CT abdomen was repeated on 10/29/2019 due to a fever spike. It demonstrated an enlarging retroperitoneal hematoma. Unfortunately spiked a fever without cause-found to have on CT 7/1 enlarging hematoma. He has not had high-grade fever since his Antibiotics were adjusted 7/1 vancomycin and Primaxin but continues to have low-grade. Upper GI performed on 10/28/2019 demonstrated a persistent leak at the site of prior anastamosis. Fistulogram was performed on 11/06/2019 which demonstrated findings consistent with a patent fistula or connection with the previously drained abscess. General surgery felt this was minimal and not the cause for his fever. Heparin was held temporarily until 7/3 and restarted cautiously 7/3 after risk-benefit discussion with family. PCCM remains involved in his care.  Repeat fistulogram is planned on 11/13/2019.  Palliative care was consulted. They have met with the family. Initially the family decided to keep patient a full code and to continue to pursue aggressive measures. This morning they have decided to make the patient DNR. Palliative care will continue to follow.   Consultants  . General Surgery . Interventional Radiology . PCCM . Palliative Care . Cardiology . Gastroenterology  Procedures  . Rt IC CVL 6/14 - 6/15/ . Rt Radial A-line 6/14 - 6/17 . ETT 6/14 - 6/17 . LUE PICC 10/14/2019 . Percutaneous drainage 10/22/2019 . Exploratory laparotomy with omental patch repair of duodenal ulcer 10/12/2019 . Fistulogram 11/06/2019, 10/30/2019  Antibiotics   Anti-infectives (From admission, onward)   Start     Dose/Rate Route Frequency Ordered Stop   11/06/19 1800  fluconazole (DIFLUCAN) IVPB 400 mg     Discontinue     400 mg 100 mL/hr over 120 Minutes Intravenous Every 24 hours 11/05/19 1850     10/31/19 2200  vancomycin (VANCOREADY) IVPB 1250 mg/250 mL  Status:  Discontinued        1,250 mg 166.7 mL/hr over 90 Minutes Intravenous Every 12 hours 10/31/19 1602 11/03/19 1152   10/29/19 2200  meropenem (MERREM) 1 g in sodium chloride 0.9 % 100 mL IVPB        1 g 200 mL/hr over 30 Minutes Intravenous Every 8 hours 10/29/19 2008 11/05/19 2207   10/29/19 2200  vancomycin (VANCOCIN) IVPB 1000 mg/200 mL premix  Status:  Discontinued        1,000 mg 200 mL/hr over 60 Minutes Intravenous Every 12 hours 10/29/19 2008 10/31/19 1602   10/25/19 1800  fluconazole (DIFLUCAN) IVPB 400 mg  Status:  Discontinued  400 mg 100 mL/hr over 120 Minutes Intravenous Every 24 hours 10/24/19 1750 11/05/19 1850   10/24/19 1830  fluconazole (DIFLUCAN) IVPB 800 mg  Status:  Discontinued        800 mg 200 mL/hr over 120 Minutes Intravenous  Once 10/24/19 1750 10/24/19 1755   10/24/19 1830  fluconazole (DIFLUCAN) IVPB 800 mg        800 mg 100 mL/hr over 240 Minutes  Intravenous Every 24 hours 10/24/19 1756 10/24/19 2240   10/12/19 2000  piperacillin-tazobactam (ZOSYN) IVPB 3.375 g  Status:  Discontinued        3.375 g 12.5 mL/hr over 240 Minutes Intravenous Every 8 hours 10/12/19 1911 10/29/19 1945   10/12/19 1708  ceFAZolin (ANCEF) 2-4 GM/100ML-% IVPB       Note to Pharmacy: Bennye Alm   : cabinet override      10/12/19 1708 10/12/19 1849      Subjective  The patient is resting in bed. He is sleeping and not awakened. Daughter is at bedside.  Objective   Vitals:  Vitals:   11/13/19 0700 11/13/19 1100  BP:    Pulse:    Resp:    Temp: 98.9 F (37.2 C) 99.9 F (37.7 C)  SpO2:     Exam:  Constitutional:  . The patient is resting quietly. No acute distress. Respiratory:  . No increased work of breathing. . No wheezes, rales, or rhonchi . No tactile fremitus Cardiovascular:  . Regular rate and rhythm . No murmurs, ectopy, or gallups. . No lateral PMI. No thrills. Abdomen:  . Abdomen is more distended than yesterday. Non-tender. Niel Hummer present in right upper quadrant. Scant output. . No hernias, masses, or organomegaly . Hypoactive bowel sounds.  Musculoskeletal:  . No cyanosis, clubbing, or edema Skin:  . No rashes, lesions, ulcers . palpation of skin: no induration or nodules Neurologic:  . CN 2-12 intact . Sensation all 4 extremities intact . Motor 5/5 in all extremities. Psychiatric:  . Unable to evaluate as the patient is unable to cooperate with exam.  I have personally reviewed the following:   Today's Data  . Vitals, BMP  Micro Data  . Blood cultures x 2 . Abdominal X-ray: Colonic ileus  Scheduled Meds: . chlorhexidine  15 mL Mouth Rinse BID  . Chlorhexidine Gluconate Cloth  6 each Topical Daily  . insulin aspart  0-20 Units Subcutaneous Q6H  . insulin detemir  10 Units Subcutaneous BID  . lidocaine  2 patch Transdermal Q24H  . mouth rinse  15 mL Mouth Rinse q12n4p  . metoprolol tartrate  5 mg  Intravenous Q4H  . pantoprazole (PROTONIX) IV  40 mg Intravenous Q12H  . simethicone  160 mg Oral QID  . sodium chloride flush  10-40 mL Intracatheter Q12H  . sodium chloride flush  5 mL Intracatheter Q8H  . sucralfate  1 g Oral BID   Continuous Infusions: . sodium chloride 10 mL/hr at 11/07/19 1300  . fluconazole (DIFLUCAN) IV Stopped (11/12/19 2020)  . methocarbamol (ROBAXIN) IV Stopped (11/09/19 2025)  . potassium chloride 10 mEq (11/13/19 1457)  . TPN ADULT (ION) 95 mL/hr at 11/13/19 0610  . TPN ADULT (ION)      Principal Problem:   Severe sepsis with septic shock (HCC) Active Problems:   HTN (hypertension)   Malignant neoplasm of prostate (HCC)   Hyperlipidemia   Obstipation   HLD (hyperlipidemia)   Diabetes mellitus type 2, uncontrolled, with complications ([place])   Perforated bowel ([place])  Perforated duodenal ulcer ([place])   Acute respiratory insufficiency   Elevated troponin level   Acute respiratory failure with hypoxia (HCC)   PVCs (premature ventricular contractions)   Bilateral atelectasis   Acute abdominal pain   Palliative care by specialist   Goals of care, counseling/discussion   General weakness   Epigastric pain   Gastric ulcer   Hypokalemia   NSVT (nonsustained ventricular tachycardia) (HCC)   Diarrhea   NSTEMI (non-ST elevated myocardial infarction) ([place])   Acute deep vein thrombosis (DVT) of left upper extremity (HCC)   Acute systolic CHF (congestive heart failure) (HCC)   Peritonitis (HCC)   Anemia of chronic disease   Hypotension   Nontraumatic retroperitoneal hematoma: 11/08/2019   Hemorrhagic shock (HCC):11/08/2019   Acute respiratory failure with hypoxemia (HCC)   Intraabdominal fluid collection   LOS: 32 days   A & P  Septic shock secondary to peritonitis due to perforated duodenal ulcer: The patient is tatus post expiratory laparotomy, omental patch repair of duodenal ulcer per Dr. [name] 10/12/2019 with postop leak/postop IR fistula tract  to the duodenal bulb.  Currently afebrile.  Normal white count.  Patient noted to have upper GI series 10/28/2019 showing persistent small leak with repeat upper GI series done on 11/03/2018 one of the duodenal bulb likely related to prior surgical repair.  No definite persistent leak identified on today's examination. Patient noted to have fluid collections 10/21/2019 with drain placement with cultures positive for Candida.  Status post drain placement. He is on IV fluconazole. Patient noted to have a T-max of 102.1 on 7/1/ 2021 with a change in antibiotics from IV vancomycin(s/p 5 days) to IV Merrem.  Blood cultures obtained with no growth to date.  Currently afebrile with a normal white count.  Dr. [name] discussed with general surgery, Dr. [name] on 10/30/2019 and was felt that enlarging hematoma was not source of infection as it would drain into the midline wound.  Continue IV PPI, bowel rest, NG tube, TPN.  Patient s/p 8 days of IV Merrem.  IV of Merrem has been discontinued.  Patient afebrile.  Loose stools have decreased.  C. difficile PCR was negative.  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   Right upper quadrant drain intact.  Per general surgery likely need a repeat fistulogram this week to assess for closure. Continue IV fluconazole while drain in place as cultures were positive for Candida albicans.  Mobilize.  KUB performed this morning demonstrated colonic ileus.  NG tube has been removed.  Will likely need SLP evaluation prior to p.o. intake.  Per IR repeat fistulogram on 11/13/2019.  General surgery recommending ongoing n.p.o. and drain for now.  Per general surgery.  IR following. Repeat fistulogram is planned for 11/13/2019.   Hypotension/hemorrhagic shock secondary to large retroperitoneal: Resolved. Patient noted to be hypotensive 10/31/5007 with systolic blood pressures in the 70s requiring transfer to the ICU and pressors of Levophed.  CBC done  noted a hemoglobin of 5.9.  CT abdomen and pelvis done consistent with a large retroperitoneal hematoma with evidence of active bleed within the right psoas muscle.  Persistent but decreased size of right rectus muscle hematoma.  Patient was transferred to the ICU and transfused 2 units of FFP, 4 units total of packed red blood cells on 11/08/2019, 11/09/2019.  Levophed subsequently titrated off.  Hemoglobin currently at 9.3 after 4 units of packed red blood cells over the past 48 hours or so.  IV  heparin has been discontinued and patient no longer a candidate for anticoagulation.  Clonidine patch discontinued.  Nitroglycerin paste discontinued.  IV Lopressor dose decreased.  Follow H&H.  PCCM consulted and are following.  Colonic ileus: Question role of severe constipation due to opioid use. The patient is on relistor.   Acute hypoxic respiratory failure: The patient is currently saturating at 96% on room air. He is status post extubation 10/15/2019.    Acute systolic heart failure/non-STEMI/coronary artery disease 2000/NSVT: Compensated. Euvolemic.  Blood pressure initially was stable however patient now noted th to be hypotensive on 12/30/1113 with systolic blood pressures in the 70s.  Clonidine patch discontinued.  IV beta-blocker decreased to 5 mg every 4 hours.  Early on in the hospitalization patient noted to have a 5-6 beat run of nonsustained V. tach however asymptomatic on 11/04/2019.  Potassium at 4.3.  Magnesium at 2.2.  Phosphorus at 4.  IV beta-blocker dose had been increased to 10 mg every 4 hours per cardiology however due to hemorrhagic shock IV Lopressor has been decreased. If develops nonsustained V. tach may up-titrate Lopressor as tolerated by blood pressure back to 10 mg IV every 4 hours. Keep potassium > 4, keep magnesium > 2.   Left upper extremity DVT cephalic/axillary vein: Noted. Likely secondary to PICC line.  IV heparin discontinued 11/08/2019 due to hemorrhagic shock secondary to  large retroperitoneal hematoma.  Follow.  Right upper extremity swelling and pain and discomfort: Heparin initially discontinued on 10/29/2019 after enlarging hematoma.  Heparin was resumed without a bolus on 10/31/2019 after Dr. [name] discussed with a specialist.  Right upper extremity discomfort initially felt to be secondary to a DVT however Dopplers negative.  Patient now with large retroperitoneal hematoma with hemorrhagic shock on 11/08/2019 and as such heparin discontinued.  Patient likely not a candidate for anticoagulation. Supportive care.    Well-controlled diabetes mellitus type 2: Hemoglobin A1c 6.6.  Blood glucose level of 112 this morning.  Patient noted not to be a diabetic prior to admission and elevated CBGs likely secondary to TPN.  Sliding scale insulin. Glucoses have been running 96 - 132 in the last 24 hours.  Anemia of critical illness/acute blood loss anemia: Status post transfusion of 2 units packed red blood cells (10/25/2019).  Patient underwent hemorrhagic shock on 11/08/2019 secondary to large retroperitoneal hematoma.  Status post transfusion of 2 units packed red blood cells 11/08/2019 and 2 units of FFP.  Hemoglobin at 6.7 on 11/09/2019 and patient transfused another 2 units of packed red blood cells hemoglobin currently at 9.3. Transfusion threshold hemoglobin  <7.   Malignancy of the prostate: Noted. Follow up as outpatient.  Tobacco abuse: Tobacco cessation.  Diarrhea: Likely due to TPN. Per RN patient with voluminous loose stools noted in rectal pouch a 6- 7 days ago which has since improved.  C. difficile PCR negative.  KUB negative.  IV antibiotics discontinued. 4 stools yesterday.  Follow.  Lower extremity pain: Secondary to retroperitoneal hematoma.  Clinical improvement with transfusion of packed red blood cells and FFP in addition to pain management.  Continue Lidoderm patch, IV Robaxin as needed, IV Tylenol.  Supportive care.  Palliative care following.    Prognosis: Patient with multiple comorbidities, complicated course during this hospitalization.  Patient admitted with sepsis secondary to peritonitis, duodenal ulcer with perforation status post repair, complicated by non-STEMI, acute CHF.  Patient with ongoing fistula currently n.p.o. on TPN, left upper extremity DVT placed on heparin however complicated by abdominal hematoma and  now with large retroperitoneal hematoma leading to hemorrhagic shock requiring pressors and currently off pressors.  Patient transfused total of 6 units packed red blood cells during this hospitalization, as well as 2 units of FFP.  Palliative care following.  Patient with a poor prognosis. Family has now decided to make the patient DNR.  I have seen and examined this patient myself. I have spent 36 minutes in his evaluation and care.  DVT prophylaxis: SCDs Code Status: Full Family Communication: Updated patient and daughter at bedside. Disposition: The patient is from home. He is admitted as an inpatient. Anticipate discharge to [facility] or rehab. Barriers to discharge include need to continue to monitor fistula by surgery.  Status is: Inpatient  Dispo:             Patient From: [place]             Planned Disposition:  LTAC vs SNF             Expected discharge date: 11/27/19             Medically stable for discharge:   Patient currently not medically stable for discharge.  Patient with a hemorrhagic shock on 11/08/2019 resuscitated with blood products and had to be placed on a Levophed drip.  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  Patient in ICU.  Will downgrade to stepdown status.   Laquan Ludden, DO [facility] Direct contact: see [email or web address]  7PM-7AM contact night coverage as above 11/13/2019, 3:17 PM  LOS: 30 days   ADDENDUM: After having read Dr. Delanna Ahmadi note at about 5:00 pm, I went ahead and placed orders consistent  with the decision to transition to comfort care. At about 7:00 pm I received a message from nursing that the older daughter was not on board with the decision and was asking to talk with Dr. Hilma Favors and the surgeons about the decision. I reversed the comfort care orders.

## 2019-11-14 DIAGNOSIS — L0291 Cutaneous abscess, unspecified: Secondary | ICD-10-CM

## 2019-11-14 LAB — COMPREHENSIVE METABOLIC PANEL
ALT: 57 U/L — ABNORMAL HIGH (ref 0–44)
AST: 32 U/L (ref 15–41)
Albumin: 1.7 g/dL — ABNORMAL LOW (ref 3.5–5.0)
Alkaline Phosphatase: 65 U/L (ref 38–126)
Anion gap: 9 (ref 5–15)
BUN: 35 mg/dL — ABNORMAL HIGH (ref 8–23)
CO2: 26 mmol/L (ref 22–32)
Calcium: 7.9 mg/dL — ABNORMAL LOW (ref 8.9–10.3)
Chloride: 105 mmol/L (ref 98–111)
Creatinine, Ser: 0.68 mg/dL (ref 0.61–1.24)
GFR calc Af Amer: >60 mL/min (ref >60)
GFR calc non Af Amer: >60 mL/min (ref >60)
Glucose, Bld: 148 mg/dL — ABNORMAL HIGH (ref 70–99)
Potassium: 4 mmol/L (ref 3.5–5.1)
Sodium: 140 mmol/L (ref 135–145)
Total Bilirubin: 1 mg/dL (ref 0.3–1.2)
Total Protein: 5.9 g/dL — ABNORMAL LOW (ref 6.5–8.1)

## 2019-11-14 LAB — GLUCOSE, CAPILLARY
Glucose-Capillary: 156 mg/dL — ABNORMAL HIGH (ref 70–99)
Glucose-Capillary: 134 mg/dL — ABNORMAL HIGH (ref 70–99)
Glucose-Capillary: 131 mg/dL — ABNORMAL HIGH (ref 70–99)
Glucose-Capillary: 108 mg/dL — ABNORMAL HIGH (ref 70–99)
Glucose-Capillary: 143 mg/dL — ABNORMAL HIGH (ref 70–99)

## 2019-11-14 LAB — MAGNESIUM: Magnesium: 2.2 mg/dL (ref 1.7–2.4)

## 2019-11-14 LAB — PHOSPHORUS: Phosphorus: 3.8 mg/dL (ref 2.5–4.6)

## 2019-11-14 MED ORDER — ACETAMINOPHEN 10 MG/ML IV SOLN
1000.0000 mg | Freq: Four times a day (QID) | INTRAVENOUS | Status: AC
  Administered 2019-11-14 – 2019-11-15 (×4): 1000 mg via INTRAVENOUS
  Filled 2019-11-14 (×4): qty 100

## 2019-11-14 MED ORDER — TRAVASOL 10 % IV SOLN
INTRAVENOUS | Status: AC
  Filled 2019-11-14: qty 1208.4

## 2019-11-14 NOTE — Progress Notes (Signed)
Daily Progress Note   Patient Name: Matthew Schwartz       Date: 11/14/2019 DOB: 1933-04-13  Age: 86 y.o. MRN#: [id number] Attending Physician: Karie Kirks, DO Primary Care Physician: [name], MD Admit Date: 10/12/2019  Reason for Consultation/Follow-up: Establishing goals of care  Subjective: Patient opens eyes to voice. He minimally participates in conversation today but able to make needs known to family at bedside and smiling. Denies current pain or discomfort.   GOC:  F/u with wife [name]) and daughter [name]) at bedside. It is important for daughter, [name], to be part of conversations. She is not currently at bedside. Family prefers to meet with PMT medical director, Dr Hilma Favors. Unfortunately, Dr. Hilma Favors is unavailable to meet with them this afternoon.   This NP did spend time answering questions and concerns for [name] and [name] at bedside. They are concerned about plan for discharge home with hospice on Monday and now feel this is too soon. They prefer to wait until fistulogram results are available before advanced diet to clears/liquids. They also would prefer consideration of transfer to progressive care unit for 2 days prior to discharge home with hospice. They feel more time is needed to monitor how he does on a liquid diet as well as continue to evaluate how he tolerates addition of IV dilaudid for pain.  [name] and [name] do confirm their wish for comfort for [name] and would ideally like to take him home with hospice. They prefer to meet with Dr. Hilma Favors on Monday, 7/19 after fistulogram results are available. Answered many questions regarding hospice philosophy and options, including consideration of hospice facility placement as they will be able to provide 24/7 care. Family considering  additional private duty caregivers. Daughter is not ready for hospice referral today to further discuss how much support hospice staff can provide in the home.   Discussed symptom management. Family reports jitters and hallucinations following initial Dilaudid 1mg  IV dose last night. The patient has had three additional Dilaudid 1mg  IV doses since initial dose and RN denies further jitters or hallucinations from additional doses. [name] and [name] do feel the dilaudid has better controlled his pain as he is having a fairly good day, but also more sleepy. Discussed that dilaudid may continue to make [name] drowsy but ensure he is comfortable and not suffering.  Again, family is NOT ready for discharge home with hospice on Monday. They wish to further discuss with Dr. Hilma Favors on Monday. They are waiting for fistulogram results, his ability to tolerate liquids, and ongoing evaluation of symptom management. Encouraged they consider hospice facility as an option. Reassured of ongoing palliative support and PMT contact information given.    Length of Stay: 33  Current Medications: Scheduled Meds:  . chlorhexidine  15 mL Mouth Rinse BID  . Chlorhexidine Gluconate Cloth  6 each Topical Daily  . insulin aspart  0-9 Units Subcutaneous Q4H  . insulin detemir  10 Units Subcutaneous Daily  . lidocaine  2 patch Transdermal Q24H  . metoprolol tartrate  5 mg Intravenous Q8H  . pantoprazole (PROTONIX) IV  40 mg Intravenous Q24H  . simethicone  160 mg Oral QID  . sodium chloride flush  10-40 mL Intracatheter Q12H  . sodium chloride flush  5 mL Intracatheter Q8H    Continuous Infusions: . sodium chloride 10 mL/hr at 11/07/19 1300  . acetaminophen Stopped (11/14/19 1225)  . fluconazole (DIFLUCAN) IV Stopped (11/14/19 0037)  . methocarbamol (ROBAXIN) IV Stopped (11/14/19 0111)  . TPN ADULT (ION) 95 mL/hr at 11/14/19 0530  . TPN ADULT (ION)      PRN Meds: sodium chloride, acetaminophen, acetaminophen,  bisacodyl, hydrALAZINE, HYDROmorphone (DILAUDID) injection, ipratropium-albuterol, lip balm, LORazepam, melatonin, methocarbamol (ROBAXIN) IV, ondansetron (ZOFRAN) IV, prochlorperazine, silver nitrate applicators, sodium chloride flush  Physical Exam Vitals and nursing note reviewed.  Constitutional:      Appearance: He is ill-appearing.  Pulmonary:     Effort: No tachypnea, accessory muscle usage or respiratory distress.  Abdominal:     General: There is distension.     Tenderness: There is abdominal tenderness.  Neurological:     Mental Status: He is easily aroused.            Vital Signs: BP (!) 149/74   Pulse 94   Temp 98.2 F (36.8 C) (Axillary)   Resp (!) 34   Ht 5\' 11"  (1.803 m)   Wt 94.5 kg   SpO2 98%   BMI 29.06 kg/m  SpO2: SpO2: 98 % O2 Device: O2 Device: Room Air O2 Flow Rate: O2 Flow Rate (L/min): 2 L/min  Intake/output summary:   Intake/Output Summary (Last 24 hours) at 11/14/2019 1644 Last data filed at 11/14/2019 0530 Gross per 24 hour  Intake 2971.16 ml  Output 1550 ml  Net 1421.16 ml   LBM: Last BM Date: 11/13/19 Baseline Weight: Weight: 96.4 kg Most recent weight: Weight: 94.5 kg       Palliative Assessment/Data: PPS 20%    Flowsheet Rows     Most Recent Value  Intake Tab  Referral Department Hospitalist  Unit at Time of Referral ICU  Date Notified 10/23/19  Palliative Care Type New Palliative care  Reason for referral Clarify Goals of Care  Date of Admission 10/12/19  Date first seen by Palliative Care 10/24/19  # of days Palliative referral response time 1 Day(s)  # of days IP prior to Palliative referral 11  Clinical Assessment  Psychosocial & Spiritual Assessment  Palliative Care Outcomes      Patient Active Problem List   Diagnosis Date Noted  . Nontraumatic retroperitoneal hematoma: 11/08/2019 11/09/2019  . Hemorrhagic shock (HCC):11/08/2019 11/09/2019  . Acute respiratory failure with hypoxemia ([place])   . Intraabdominal fluid  collection   . Anemia of chronic disease   . Hypotension   . Gastric ulcer   . Hypokalemia   .  NSVT (nonsustained ventricular tachycardia) ([place])   . Diarrhea   . Severe sepsis with septic shock ([place])   . NSTEMI (non-ST elevated myocardial infarction) ([place])   . Acute deep vein thrombosis (DVT) of left upper extremity ([place])   . Acute systolic CHF (congestive heart failure) ([place])   . Peritonitis ([place])   . Epigastric pain   . Palliative care by specialist   . Goals of care, counseling/discussion   . General weakness   . Acute abdominal pain   . Bilateral atelectasis 10/17/2019  . Acute respiratory insufficiency   . Elevated troponin level   . Acute respiratory failure with hypoxia ([place])   . PVCs (premature ventricular contractions)   . Obstipation 10/12/2019  . HLD (hyperlipidemia) 10/12/2019  . Diabetes mellitus type 2, uncontrolled, with complications ([place]) 41/66/0630  . Perforated bowel ([place]) 10/12/2019  . Perforated duodenal ulcer ([place]) 10/12/2019  . Anemia 09/07/2016  . Coronary artery disease due to lipid rich plaque 02/16/2015  . Hyperlipidemia 02/16/2015  . Malignant neoplasm of prostate ([place]) 02/18/2014  . HTN (hypertension) 06/14/2013  . Dyslipidemia 06/14/2013  . GERD (gastroesophageal reflux disease) 06/14/2013    Palliative Care Assessment & Plan   Patient Profile: The patient is an 86 yr old [place] man who carries a past medical history significant for NIDDM, HLD, GERD, irritable bowel syndrome (constipation) status post left knee replacement 2009, PTCA 2000-->RCA well as prior prostate CA, former smoker. He was admitted on 10/12/2019 due to severe abdominal pain, s/p ex lap 10/12/2019 for a perforated duodenal ulcer. The patient remained on vent postoperatively. During this period of time he also was found to have NSVT postop. Echo showed Systolic HF with an EF of 40-45% with gloval left ventricular hypokinesis with disproportionately severe inferior lateral wall  hypokinesis. There was also Grade I diastolic dysfunction. The patient require pressors in the ICU. Cardiology was consulted. He was extubated on 10/15/2019. On 10/21/2019 the patient had fevers that were found to be due to a postoperative abscess. IR was consulted and they placed a transcutaneous percutaneous drain on 10/22/2019. This effluent was cultures and was found to grow candida. The patient was started on diflucan. CT abdomen was repeated on 10/29/2019 due to a fever spike. It demonstrated an enlarging retroperitoneal hematoma. Unfortunately spiked a fever without cause-found to have on CT 7/1 enlarging hematoma. He has not had high-grade fever since his Antibiotics were adjusted 7/1 vancomycin and Primaxin but continues to have low-grade. Upper GI performed on 10/28/2019 demonstrated a persistent leak at the site of prior anastamosis. Fistulogram was performed on 11/06/2019 which demonstrated findings consistent with a patent fistula or connection with the previously drained abscess. General surgery felt this was minimal and not the cause for his fever. Heparin was held temporarily until 7/3 and restarted cautiously 7/3 after risk-benefit discussion with family. PCCM remains involved in his care. Repeat fistulogram is planned on 11/13/2019.   Recommendations/Plan:  Family is NOT ready for discharge home with hospice on Monday. They wish to further discuss with Dr. Hilma Favors when available and prefer a family meeting with wife and two daughters on Monday. This was discussed in detail with RN and Dr. Hilma Favors.   Family waiting on results of fistulogram before considering comfort feeds/liquids. Ideally, they would wish for 2 days in a progressive care unit to monitor how he tolerates liquids.   Discussed in detail hospice philosophy and home hospice versus hospice home. Family considering options. Leaning towards home with hospice.   Continue Dilaudid 0.5-1mg   IV q2h prn pain. Family prefer RN's give 0.5mg   first and if this does not seem to manage pain, give additional 0.5mg . Discussed with RN to pass along on shift change.   PMT will continue to follow. Dr. Hilma Favors aware and will arrange family meeting for Monday.   Code Status:  DNR   Code Status Orders  (From admission, onward)         Start     Ordered   11/12/19 0940  Do not attempt resuscitation (DNR)  Continuous       Question Answer Comment  In the event of cardiac or respiratory ARREST Do not call a "code blue"   In the event of cardiac or respiratory ARREST Do not perform Intubation, CPR, defibrillation or ACLS   In the event of cardiac or respiratory ARREST Use medication by any route, position, wound care, and other measures to relive pain and suffering. May use oxygen, suction and manual treatment of airway obstruction as needed for comfort.      11/12/19 0940        Code Status History    Date Active Date Inactive Code Status Order ID Comments User Context   10/12/2019 1212 11/12/2019 0940 Full Code [id number]  Allie Bossier, MD ED   02/12/2014 1127 02/13/2014 0336 Full Code [id number]  Arne Cleveland, MD [facility] Planning Activity    Advance Directive Documentation     Most Recent Value  Type of Advance Directive Healthcare Power of Attorney, Living will  Pre-existing out of facility DNR order (yellow form or pink MOST form) --  "MOST" Form in Place? --       Prognosis:   Poor prognosis   Discharge Planning:  To Be Determined  Care plan was discussed with RN, Dr. Hilma Favors, patient, wife, daughter [name])  Thank you for allowing the Palliative Medicine Team to assist in the care of this patient.   Time In: 1600 Time Out: 1650 Total Time 50 Prolonged Time Billed  no      Greater than 50%  of this time was spent counseling and coordinating care related to the above assessment and plan.  Ihor Dow, DNP, FNP-C Palliative Medicine Team  Phone: [phone] Fax: [phone]  Please  contact Palliative Medicine Team phone at [phone] for questions and concerns.

## 2019-11-14 NOTE — Progress Notes (Signed)
PHARMACY - TOTAL PARENTERAL NUTRITION CONSULT NOTE   Indication: Prolonged ileus  Patient Measurements: Height: _0  (180.3 cm) Weight: 94.5 kg (208 lb 5.4 oz) IBW/kg (Calculated) : 75.3 TPN AdjBW (KG): 91.9 Body mass index is 29.06 kg/m. Usual Weight: 90.7 kg   Assessment:  Pharmacy consulted to start TPN in 86 yo male with prolonged ileus. Pt underwent exploratory laparotomy on 6/14 which revealed perforated duodenal ulcer.   Glucose / Insulin: hx of DM - on metformin PTA.  - on rSSI q6h (CBG range 128-156) - 8 units of SSI required in past 24h; Insulin in TPN 50 units to 30/bag 7/14 - Levemir 10 units decr to daily per CCM started 6/18  Electrolytes: K+ 4 (target >/= 4.0) . Magnesium 2.2 (target >/= 2.0). All other lytes, including Corrected Ca, WNL.   Renal: BUN elevated, improving , SCr wnl  LFTs / TGs: AST/ALT 67/89; T.bili, Alk Phos WNL. TG 65 (7/5), 73 (7/12) Prealbumin / albumin: Prealbumin 8.3 (7/5), 9.6 (6/28), 9.0 (6/21), 10.9 (7/12) Albumin 2.1 Intake / Output; MIVF: NG removed; 1600 ml UOP GI Imaging:  - 6/21 Upper GI series: small leak from superior margin of duodenal bulb. - 6/23 CT abdomen: 4.3 cm air-fluid collection in the right upper quadrant of the abdomen between the duodenal bulb and gallbladder,not containing enteric contrast. No residual leak from the duodenal bulb identified. - 7/1 Abdominal CT: large irregular high density noted most consistent with enlarging subcutaneous or IM hematoma - 7/6 Upper GI showed no leak.  Will need repeat sinogram without fistula to start oral feeds to maximize chance of this healing.   7/16 Sinogram: no result available Surgeries / Procedures:  - 6/14 Exploratory laparotomy which revealed perforated duodenal ulcer - 6/24 RUQ drain placed > 10 ml > cultured - 6/25 removal R abd JP drain  - 7/2 R abd drain placed  Central access:  PICC Line 6/15 TPN start date: 6/15  Nutritional Goals: per RD recommendation on  7/6: Kcal:  2200-2400 Protein:  115-125 grams Fluid:  >2L/d  Goal TPN rate is 95 mL/hr (provides 121 g of protein and 2262 kcals per day)  Current Nutrition:  NPO   Plan:  Allow patient to eat regardless of Sinogram result, transitioning to pall care, family mtg 3:30 (7/17) Note that standing TPN lab orders discontinued   Continue TPN at 95 mL/hr at 1800, goal rate   Electrolytes in TPN:   85 mEq/L of Na, 7/15 increased to 100 mEq/L of K , 4 mEq/L of Ca, 12 mEq/L of Magnesium, and 15 mmol/L of Phos. Cl:Ac ratio max Acetate  Standard MVI and trace elements to TPN  Continue resistant q6h SSI, levemir and adjust as needed   Continue regular insulin 30  units/day in TPN. Aiming to prevent hypoglycemia.    30 units in TPN & 10 units Levemir daily deliver about 1.6 units/hr insulin  Labs as needed  Minda Ditto PharmD 11/14/2019, 10:13 AM

## 2019-11-14 NOTE — Progress Notes (Signed)
Reviewed case today at Dr Delanna Ahmadi request - I saw this pt 6/19 and 6/20 and was concerned then that he would not benefit from prolonged critical care support and fully agree with plans to go home with hospice at this point  I made several passes by today hoping to meet with daughter but she was not at bedside and will try again 7/18 with plan to d/c home to hospice 7/19 recognizing the need to have the whole fm on board with decision making / comfort goals.   Christinia Gully, MD Pulmonary and [place] [phone]   After 7:00 pm call Elink  [phone]

## 2019-11-14 NOTE — Progress Notes (Signed)
PROGRESS NOTE  Matthew Schwartz MIW:[id number] DOB: 28-Apr-1933 DOA: 10/12/2019 PCP: Matthew Fess, MD  Brief History   The patient is an 86 yr old [place] man who carries a past medical history significant for NIDDM, HLD, GERD, irritable bowel syndrome (constipation) status post left knee replacement 2009, PTCA 2000-->RCA well as prior prostate CA, former smoker. He was admitted on 10/12/2019 due to severe abdominal pain, s/p ex lap 10/12/2019 for a perforated duodenal ulcer. The patient remained on vent postoperatively. During this period of time he also was found to have NSVT postop. Echo showed Systolic HF with an EF of 40-45% with gloval left ventricular hypokinesis with disproportionately severe inferior lateral wall hypokinesis. There was also Grade I diastolic dysfunction. The patient require pressors in the ICU. Cardiology was consulted. He was extubated on 10/15/2019. On 10/21/2019 the patient had fevers that were found to be due to a postoperative abscess. IR was consulted and they placed a transcutaneous percutaneous drain on 10/22/2019. This effluent was cultures and was found to grow candida. The patient was started on diflucan. CT abdomen was repeated on 10/29/2019 due to a fever spike. It demonstrated an enlarging retroperitoneal hematoma. Unfortunately spiked a fever without cause-found to have on CT 7/1 enlarging hematoma. He has not had high-grade fever since his Antibiotics were adjusted 7/1 vancomycin and Primaxin but continues to have low-grade. Upper GI performed on 10/28/2019 demonstrated a persistent leak at the site of prior anastamosis. Fistulogram was performed on 11/06/2019 which demonstrated findings consistent with a patent fistula or connection with the previously drained abscess. General surgery felt this was minimal and not the cause for his fever. Heparin was held temporarily until 7/3 and restarted cautiously 7/3 after risk-benefit discussion with family. PCCM remains involved in his care.  Repeat fistulogram is planned on 11/13/2019.  Palliative care was consulted. They have met with the family. Initially the family decided to keep patient a full code and to continue to pursue aggressive measures. This morning of 11/12/2019 they have decided to make the patient DNR. Palliative care met again with the daughter Matthew Schwartz and the patient's spouse yesterday. During this meeting the patient himself expressed the wish to pass peacefully and to be done with aggressive medical interventions. Matthew Schwartz and Matthew Schwartz agreed. The goal is to have him go home with hospice on Monday. However, the older daughter, Matthew Schwartz, had the nurse contact me and let me know that before the patient was placed on comfort care, she wanted to be part of these discussions. Dr. Hilma Favors is aware and plans to meet with the entire family today.  Consultants  . General Surgery . Interventional Radiology . PCCM . Palliative Care . Cardiology . Gastroenterology  Procedures  . Rt IC CVL 6/14 - 6/15/ . Rt Radial A-line 6/14 - 6/17 . ETT 6/14 - 6/17 . LUE PICC 10/14/2019 . Percutaneous drainage 10/22/2019 . Exploratory laparotomy with omental patch repair of duodenal ulcer 10/12/2019 . Fistulogram 11/06/2019, 10/30/2019  Antibiotics   Anti-infectives (From admission, onward)   Start     Dose/Rate Route Frequency Ordered Stop   11/13/19 2200  fluconazole (DIFLUCAN) IVPB 400 mg     Discontinue     400 mg 100 mL/hr over 120 Minutes Intravenous Every 24 hours 11/13/19 1917     11/06/19 1800  fluconazole (DIFLUCAN) IVPB 400 mg  Status:  Discontinued        400 mg 100 mL/hr over 120 Minutes Intravenous Every 24 hours 11/05/19 1850 11/13/19 1835   10/31/19 2200  vancomycin (VANCOREADY) IVPB 1250 mg/250 mL  Status:  Discontinued        1,250 mg 166.7 mL/hr over 90 Minutes Intravenous Every 12 hours 10/31/19 1602 11/03/19 1152   10/29/19 2200  meropenem (MERREM) 1 g in sodium chloride 0.9 % 100 mL IVPB        1 g 200 mL/hr over 30  Minutes Intravenous Every 8 hours 10/29/19 2008 11/05/19 2207   10/29/19 2200  vancomycin (VANCOCIN) IVPB 1000 mg/200 mL premix  Status:  Discontinued        1,000 mg 200 mL/hr over 60 Minutes Intravenous Every 12 hours 10/29/19 2008 10/31/19 1602   10/25/19 1800  fluconazole (DIFLUCAN) IVPB 400 mg  Status:  Discontinued        400 mg 100 mL/hr over 120 Minutes Intravenous Every 24 hours 10/24/19 1750 11/05/19 1850   10/24/19 1830  fluconazole (DIFLUCAN) IVPB 800 mg  Status:  Discontinued        800 mg 200 mL/hr over 120 Minutes Intravenous  Once 10/24/19 1750 10/24/19 1755   10/24/19 1830  fluconazole (DIFLUCAN) IVPB 800 mg        800 mg 100 mL/hr over 240 Minutes Intravenous Every 24 hours 10/24/19 1756 10/24/19 2240   10/12/19 2000  piperacillin-tazobactam (ZOSYN) IVPB 3.375 g  Status:  Discontinued        3.375 g 12.5 mL/hr over 240 Minutes Intravenous Every 8 hours 10/12/19 1911 10/29/19 1945   10/12/19 1708  ceFAZolin (ANCEF) 2-4 GM/100ML-% IVPB       Note to Pharmacy: Bennye Alm   : cabinet override      10/12/19 1708 10/12/19 1849      Subjective  The patient is resting comfortably. No new complaints.  Objective   Vitals:  Vitals:   11/14/19 1151 11/14/19 1255  BP:  (!) 149/74  Pulse:  94  Resp:  (!) 34  Temp: 98.2 F (36.8 C)   SpO2:  98%   Exam:  Constitutional:  . The patient is resting quietly. No acute distress. Respiratory:  . No increased work of breathing. . No wheezes, rales, or rhonchi . No tactile fremitus Cardiovascular:  . Regular rate and rhythm . No murmurs, ectopy, or gallups. . No lateral PMI. No thrills. Abdomen:  . Abdomen is more distended than yesterday. Non-tender. Niel Hummer present in right upper quadrant. Scant output. . No hernias, masses, or organomegaly . Hypoactive bowel sounds.  Musculoskeletal:  . No cyanosis, clubbing, or edema Skin:  . No rashes, lesions, ulcers . palpation of skin: no induration or  nodules Neurologic:  . CN 2-12 intact . Sensation all 4 extremities intact . Motor 5/5 in all extremities. Psychiatric:  . Unable to evaluate as the patient is unable to cooperate with exam.  I have personally reviewed the following:   Today's Data  . Vitals, CMP  Micro Data  . Blood cultures x 2 . Abdominal X-ray: Colonic ileus  Scheduled Meds: . chlorhexidine  15 mL Mouth Rinse BID  . Chlorhexidine Gluconate Cloth  6 each Topical Daily  . insulin aspart  0-9 Units Subcutaneous Q4H  . insulin detemir  10 Units Subcutaneous Daily  . lidocaine  2 patch Transdermal Q24H  . metoprolol tartrate  5 mg Intravenous Q8H  . pantoprazole (PROTONIX) IV  40 mg Intravenous Q24H  . simethicone  160 mg Oral QID  . sodium chloride flush  10-40 mL Intracatheter Q12H  . sodium chloride flush  5 mL Intracatheter Q8H  Continuous Infusions: . sodium chloride 10 mL/hr at 11/07/19 1300  . acetaminophen Stopped (11/14/19 1225)  . fluconazole (DIFLUCAN) IV Stopped (11/14/19 0037)  . methocarbamol (ROBAXIN) IV Stopped (11/14/19 0111)  . TPN ADULT (ION) 95 mL/hr at 11/14/19 0530  . TPN ADULT (ION)      Principal Problem:   Severe sepsis with septic shock (HCC) Active Problems:   HTN (hypertension)   Malignant neoplasm of prostate (HCC)   Hyperlipidemia   Obstipation   HLD (hyperlipidemia)   Diabetes mellitus type 2, uncontrolled, with complications (HCC)   Perforated bowel ([place])   Perforated duodenal ulcer ([place])   Acute respiratory insufficiency   Elevated troponin level   Acute respiratory failure with hypoxia (HCC)   PVCs (premature ventricular contractions)   Bilateral atelectasis   Acute abdominal pain   Palliative care by specialist   Goals of care, counseling/discussion   General weakness   Epigastric pain   Gastric ulcer   Hypokalemia   NSVT (nonsustained ventricular tachycardia) (HCC)   Diarrhea   NSTEMI (non-ST elevated myocardial infarction) ([place])   Acute deep vein  thrombosis (DVT) of left upper extremity (HCC)   Acute systolic CHF (congestive heart failure) (HCC)   Peritonitis (HCC)   Anemia of chronic disease   Hypotension   Nontraumatic retroperitoneal hematoma: 11/08/2019   Hemorrhagic shock (HCC):11/08/2019   Acute respiratory failure with hypoxemia (HCC)   Intraabdominal fluid collection   LOS: 33 days   A & P  Septic shock secondary to peritonitis due to perforated duodenal ulcer: The patient is tatus post expiratory laparotomy, omental patch repair of duodenal ulcer per Dr. 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  Currently afebrile.  Normal white count.  Patient noted to have upper GI series 10/28/2019 showing persistent small leak with repeat upper GI series done on 11/03/2018 one of the duodenal bulb likely related to prior surgical repair.  No definite persistent leak identified on today's examination. Patient noted to have fluid collections 10/21/2019 with drain placement with cultures positive for Candida.  Status post drain placement. He is on IV fluconazole. Patient noted to have a T-max of 102.1 on 7/1/ 2021 with a change in antibiotics from IV vancomycin(s/p 5 days) to IV Merrem.  Blood cultures obtained with no growth to date.  Currently afebrile with a normal white count.  Dr. [name] discussed with general surgery, Dr. [name] on 10/30/2019 and was felt that enlarging hematoma was not source of infection as it would drain into the midline wound.  Continue IV PPI, bowel rest, NG tube, TPN.  Patient s/p 8 days of IV Merrem.  IV of Merrem has been discontinued.  Patient afebrile.  Loose stools have decreased.  C. difficile PCR was negative.  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   Right upper quadrant drain intact.  Per general surgery likely need a repeat fistulogram this week to assess for closure. Continue IV fluconazole while drain in place as cultures were  positive for Candida albicans.  Mobilize.  KUB performed this morning demonstrated colonic ileus.  NG tube has been removed.  Will likely need SLP evaluation prior to p.o. intake.  Per IR repeat fistulogram on 11/13/2019.  General surgery recommending ongoing n.p.o. and drain for now.  Per general surgery.  IR following. Repeat fistulogram was performed on 11/13/2019. Report is still pending.  Hypotension/hemorrhagic shock secondary to large retroperitoneal: Resolved. Patient noted to be hypotensive 1/61/0960 with systolic  blood pressures in the 70s requiring transfer to the ICU and pressors of Levophed.  CBC done noted a hemoglobin of 5.9.  CT abdomen and pelvis done consistent with a large retroperitoneal hematoma with evidence of active bleed within the right psoas muscle.  Persistent but decreased size of right rectus muscle hematoma.  Patient was transferred to the ICU and transfused 2 units of FFP, 4 units total of packed red blood cells on 11/08/2019, 11/09/2019.  Levophed subsequently titrated off.  Hemoglobin currently at 9.3 after 4 units of packed red blood cells over the past 48 hours or so.  IV heparin has been discontinued and patient no longer a candidate for anticoagulation.  Clonidine patch discontinued.  Nitroglycerin paste discontinued.  IV Lopressor dose decreased.  Follow H&H.  PCCM consulted and are following.  Colonic ileus: Question role of severe constipation due to opioid use. The patient is on relistor.   Acute hypoxic respiratory failure: The patient is currently saturating at 96% on room air. He is status post extubation 10/15/2019.    Acute systolic heart failure/non-STEMI/coronary artery disease 2000/NSVT: Compensated. Euvolemic.  Blood pressure initially was stable however patient now noted th to be hypotensive on 12/07/9831 with systolic blood pressures in the 70s.  Clonidine patch discontinued.  IV beta-blocker decreased to 5 mg every 4 hours.  Early on in the hospitalization  patient noted to have a 5-6 beat run of nonsustained V. tach however asymptomatic on 11/04/2019.  Potassium at 4.3.  Magnesium at 2.2.  Phosphorus at 4.  IV beta-blocker dose had been increased to 10 mg every 4 hours per cardiology however due to hemorrhagic shock IV Lopressor has been decreased. If develops nonsustained V. tach may up-titrate Lopressor as tolerated by blood pressure back to 10 mg IV every 4 hours. Keep potassium > 4, keep magnesium > 2.   Left upper extremity DVT cephalic/axillary vein: Noted. Likely secondary to PICC line.  IV heparin discontinued 11/08/2019 due to hemorrhagic shock secondary to large retroperitoneal hematoma.  Follow.  Right upper extremity swelling and pain and discomfort: Heparin initially discontinued on 10/29/2019 after enlarging hematoma.  Heparin was resumed without a bolus on 10/31/2019 after Dr. [name] discussed with a specialist.  Right upper extremity discomfort initially felt to be secondary to a DVT however Dopplers negative.  Patient now with large retroperitoneal hematoma with hemorrhagic shock on 11/08/2019 and as such heparin discontinued.  Patient likely not a candidate for anticoagulation. Supportive care.    Well-controlled diabetes mellitus type 2: Hemoglobin A1c 6.6.  Blood glucose level of 112 this morning.  Patient noted not to be a diabetic prior to admission and elevated CBGs likely secondary to TPN.  Sliding scale insulin. Glucoses have been running 96 - 132 in the last 24 hours.  Anemia of critical illness/acute blood loss anemia: Status post transfusion of 2 units packed red blood cells (10/25/2019).  Patient underwent hemorrhagic shock on 11/08/2019 secondary to large retroperitoneal hematoma.  Status post transfusion of 2 units packed red blood cells 11/08/2019 and 2 units of FFP.  Hemoglobin at 6.7 on 11/09/2019 and patient transfused another 2 units of packed red blood cells hemoglobin currently at 9.3. Transfusion threshold hemoglobin  <7.    Malignancy of the prostate: Noted. Follow up as outpatient.  Tobacco abuse: Tobacco cessation.  Diarrhea: Likely due to TPN. Per RN patient with voluminous loose stools noted in rectal pouch a 6- 7 days ago which has since improved.  C. difficile PCR negative.  KUB negative.  IV antibiotics discontinued. 4 stools yesterday.  Follow.  Lower extremity pain: Secondary to retroperitoneal hematoma.  Clinical improvement with transfusion of packed red blood cells and FFP in addition to pain management.  Continue Lidoderm patch, IV Robaxin as needed, IV Tylenol.  Supportive care.  Palliative care following.   Prognosis: Patient with multiple comorbidities, complicated course during this hospitalization.  Patient admitted with sepsis secondary to peritonitis, duodenal ulcer with perforation status post repair, complicated by non-STEMI, acute CHF.  Patient with ongoing fistula currently n.p.o. on TPN, left upper extremity DVT placed on heparin however complicated by abdominal hematoma and now with large retroperitoneal hematoma leading to hemorrhagic shock requiring pressors and currently off pressors.  Patient transfused total of 6 units packed red blood cells during this hospitalization, as well as 2 units of FFP.  Palliative care following.  Patient with a poor prognosis. Family has now decided to make the patient DNR. The patient himself has expressed his desire to end aggressive interventions and to seek comfort and support for the remainder of his days. His daughter Matthew Schwartz and his wife are in agreement, but his daughter Matthew Schwartz is wanting further discussions since she was not present at this meeting with palliative care. Palliative care plans to meet with the family again this afternoon. The plan remains for the patient to go home with hospice on Monday.  I have seen and examined this patient myself. I have spent 38 minutes in his evaluation and care.  DVT prophylaxis: SCDs Code Status:  Full Family Communication: Updated patient and daughter at bedside. Disposition: The patient is from home. He is admitted as an inpatient. Anticipate discharge to [facility] or rehab. Barriers to discharge include need to set the patient up for hospice at home and to get daughter Matthew Schwartz on board with the plan.  Status is: Inpatient  Dispo:             Patient From: [facility]             Planned Disposition:  LTAC vs SNF             Expected discharge date: 11/27/19             Medically stable for discharge:   Patient currently not medically stable for discharge.  Patient with a hemorrhagic shock on 11/08/2019 resuscitated with blood products and had to be placed on a Levophed drip.  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  Patient in ICU.  Will downgrade to stepdown status.   Ladona Rosten, DO [facility] Direct contact: see [email or web address]  7PM-7AM contact night coverage as above 11/14/2019, 2:23 PM  LOS: 30 days

## 2019-11-15 LAB — COMPREHENSIVE METABOLIC PANEL
ALT: 49 U/L — ABNORMAL HIGH (ref 0–44)
AST: 27 U/L (ref 15–41)
Albumin: 1.9 g/dL — ABNORMAL LOW (ref 3.5–5.0)
Alkaline Phosphatase: 66 U/L (ref 38–126)
Anion gap: 9 (ref 5–15)
BUN: 37 mg/dL — ABNORMAL HIGH (ref 8–23)
CO2: 26 mmol/L (ref 22–32)
Calcium: 8.2 mg/dL — ABNORMAL LOW (ref 8.9–10.3)
Chloride: 107 mmol/L (ref 98–111)
Creatinine, Ser: 0.61 mg/dL (ref 0.61–1.24)
GFR calc Af Amer: >60 mL/min (ref >60)
GFR calc non Af Amer: >60 mL/min (ref >60)
Glucose, Bld: 140 mg/dL — ABNORMAL HIGH (ref 70–99)
Potassium: 4 mmol/L (ref 3.5–5.1)
Sodium: 142 mmol/L (ref 135–145)
Total Bilirubin: 0.9 mg/dL (ref 0.3–1.2)
Total Protein: 6.6 g/dL (ref 6.5–8.1)

## 2019-11-15 LAB — CBC
HCT: 30.4 % — ABNORMAL LOW (ref 39.0–52.0)
Hemoglobin: 9.3 g/dL — ABNORMAL LOW (ref 13.0–17.0)
MCH: 27.8 pg (ref 26.0–34.0)
MCHC: 30.6 g/dL (ref 30.0–36.0)
MCV: 91 fL (ref 80.0–100.0)
Platelets: 313 10*3/uL (ref 150–400)
RBC: 3.34 MIL/uL — ABNORMAL LOW (ref 4.22–5.81)
RDW: 19.3 % — ABNORMAL HIGH (ref 11.5–15.5)
WBC: 10.7 10*3/uL — ABNORMAL HIGH (ref 4.0–10.5)
nRBC: 0 % (ref 0.0–0.2)

## 2019-11-15 LAB — GLUCOSE, CAPILLARY: Glucose-Capillary: 145 mg/dL — ABNORMAL HIGH (ref 70–99)

## 2019-11-15 MED ORDER — TRAVASOL 10 % IV SOLN
INTRAVENOUS | Status: DC
  Filled 2019-11-15: qty 1208.4

## 2019-11-15 MED ORDER — INSULIN ASPART 100 UNIT/ML ~~LOC~~ SOLN
0.0000 [IU] | Freq: Four times a day (QID) | SUBCUTANEOUS | Status: DC
  Administered 2019-11-15 – 2019-11-16 (×5): 1 [IU] via SUBCUTANEOUS

## 2019-11-15 MED ORDER — MUSCLE RUB 10-15 % EX CREA
TOPICAL_CREAM | CUTANEOUS | Status: AC | PRN
  Administered 2019-11-18: 1 via TOPICAL
  Filled 2019-11-15: qty 85

## 2019-11-15 NOTE — Progress Notes (Signed)
Visited with Daughter who still has many questions re d/c planning all related to his GI tract   ? How is he going to be fed  ? What about care of the drains   Explained that medical science had done all we could hope for at this point but that all her technical questions re plan of care at d/c should be answered prior to d/c.  Since they are not of a pulmonary/ccm nature I deferred them to [facility]/ surgery and palliative care.   Christinia Gully, MD Pulmonary and [place] [phone]   After 7:00 pm call Elink  [phone]

## 2019-11-15 NOTE — Progress Notes (Signed)
PHARMACY - TOTAL PARENTERAL NUTRITION CONSULT NOTE   Indication: Prolonged ileus  Patient Measurements: Height: 5' 11" (180.3 cm) Weight: 95.5 kg (210 lb 8.6 oz) IBW/kg (Calculated) : 75.3 TPN AdjBW (KG): 91.9 Body mass index is 29.36 kg/m. Usual Weight: 90.7 kg   Assessment:  Pharmacy consulted to start TPN in 86 yo male with prolonged ileus. Pt underwent exploratory laparotomy on 6/14 which revealed perforated duodenal ulcer.   Glucose / Insulin: hx of DM - on metformin PTA.  - on rSSI q6h (CBG range 108-143) - 5 units of SSI required in past 24h; Insulin in TPN 30 units/day - Levemir 10 units decr to daily per CCM started 6/18  Electrolytes: K 4 (target >/= 4.0) . Magnesium 2.2 (target >/= 2.0). All other lytes, including Corrected Ca, WNL.   Renal: BUN elevated, SCr wnl  LFTs / TGs: AST/ALT 67/89; T.bili, Alk Phos WNL. TG 65 (7/5), 73 (7/12) Prealbumin / albumin: Prealbumin 8.3 (7/5), 9.6 (6/28), 9.0 (6/21), 10.9 (7/12) Albumin 2.1 Intake / Output; MIVF: NG removed; 1600 ml UOP GI Imaging:  - 6/21 Upper GI series: small leak from superior margin of duodenal bulb. - 6/23 CT abdomen: 4.3 cm air-fluid collection in the right upper quadrant of the abdomen between the duodenal bulb and gallbladder,not containing enteric contrast. No residual leak from the duodenal bulb identified. - 7/1 Abdominal CT: large irregular high density noted most consistent with enlarging subcutaneous or IM hematoma - 7/6 Upper GI showed no leak.  Will need repeat sinogram without fistula to start oral feeds to maximize chance of this healing.   7/16 Sinogram: no result available Surgeries / Procedures:  - 6/14 Exploratory laparotomy which revealed perforated duodenal ulcer - 6/24 RUQ drain placed > 10 ml > cultured - 6/25 removal R abd JP drain  - 7/2 R abd drain placed  Central access:  PICC Line 6/15 TPN start date: 6/15  Nutritional Goals: per RD recommendation on 7/6: Kcal:  2200-2400 Protein:   115-125 grams Fluid:  >2L/d  Goal TPN rate 95 mL/hr (provides 121 g of protein and 2262 kcals per day)  Current Nutrition:  NPO   Plan:  Family not ready to let patient eat, awaiting Sinogram results, family not ready for d/c Monday with hospice care Note that standing TPN lab orders discontinued   Continue TPN at 95 mL/hr at 1800, goal rate   Electrolytes in TPN:   85 mEq/L of Na, 100 mEq/L of K , 4 mEq/L of Ca, 12 mEq/L of Magnesium, and 15 mmol/L of Phos   Cl:Ac ratio max Acetate  Standard MVI and trace elements to TPN  Continue sensitive Novolog q6h SSI, levemir and adjust as needed   Continue regular insulin 30  units/day in TPN. Aiming to prevent hypoglycemia.    30 units in TPN & 10 units Levemir daily deliver about 1.6 units/hr insulin, reduce as needed  Labs per MD  Minda Ditto PharmD 11/15/2019, 7:22 AM

## 2019-11-15 NOTE — Progress Notes (Signed)
PROGRESS NOTE  Matthew Schwartz XFG:[id number] DOB: June 13, 1932 DOA: 10/12/2019 PCP: [name], MD  Brief History   The patient is an 86 yr old [place] man who carries a past medical history significant for NIDDM, HLD, GERD, irritable bowel syndrome (constipation) status post left knee replacement 2009, PTCA 2000-->RCA well as prior prostate CA, former smoker. He was admitted on 10/12/2019 due to severe abdominal pain, s/p ex lap 10/12/2019 for a perforated duodenal ulcer. The patient remained on vent postoperatively. During this period of time he also was found to have NSVT postop. Echo showed Systolic HF with an EF of 40-45% with gloval left ventricular hypokinesis with disproportionately severe inferior lateral wall hypokinesis. There was also Grade I diastolic dysfunction. The patient require pressors in the ICU. Cardiology was consulted. He was extubated on 10/15/2019. On 10/21/2019 the patient had fevers that were found to be due to a postoperative abscess. IR was consulted and they placed a transcutaneous percutaneous drain on 10/22/2019. This effluent was cultures and was found to grow candida. The patient was started on diflucan. CT abdomen was repeated on 10/29/2019 due to a fever spike. It demonstrated an enlarging retroperitoneal hematoma. Unfortunately spiked a fever without cause-found to have on CT 7/1 enlarging hematoma. He has not had high-grade fever since his Antibiotics were adjusted 7/1 vancomycin and Primaxin but continues to have low-grade. Upper GI performed on 10/28/2019 demonstrated a persistent leak at the site of prior anastamosis. Fistulogram was performed on 11/06/2019 which demonstrated findings consistent with a patent fistula or connection with the previously drained abscess. General surgery felt this was minimal and not the cause for his fever. Heparin was held temporarily until 7/3 and restarted cautiously 7/3 after risk-benefit discussion with family. PCCM remains involved in his care.  Repeat fistulogram is planned on 11/13/2019.  Palliative care was consulted. They have met with the family. Initially the family decided to keep patient a full code and to continue to pursue aggressive measures. This morning of 11/12/2019 they have decided to make the patient DNR. Palliative care met again with the daughter Matthew Schwartz and the patient's spouse yesterday. During this meeting the patient himself expressed the wish to pass peacefully and to be done with aggressive medical interventions. Matthew Schwartz and Matthew Schwartz agreed. The goal is to have him go home with hospice on Monday. However, the older daughter, Matthew Schwartz, had the nurse contact me and let me know that before the patient was placed on comfort care, she wanted to be part of these discussions. Dr. Hilma Favors is aware and plans to meet with the entire family when she is available.  Consultants  . General Surgery . Interventional Radiology . PCCM . Palliative Care . Cardiology . Gastroenterology  Procedures  . Rt IC CVL 6/14 - 6/15/ . Rt Radial A-line 6/14 - 6/17 . ETT 6/14 - 6/17 . LUE PICC 10/14/2019 . Percutaneous drainage 10/22/2019 . Exploratory laparotomy with omental patch repair of duodenal ulcer 10/12/2019 . Fistulogram 11/06/2019, 10/30/2019  Antibiotics   Anti-infectives (From admission, onward)   Start     Dose/Rate Route Frequency Ordered Stop   11/13/19 2200  fluconazole (DIFLUCAN) IVPB 400 mg     Discontinue     400 mg 100 mL/hr over 120 Minutes Intravenous Every 24 hours 11/13/19 1917     11/06/19 1800  fluconazole (DIFLUCAN) IVPB 400 mg  Status:  Discontinued        400 mg 100 mL/hr over 120 Minutes Intravenous Every 24 hours 11/05/19 1850 11/13/19 1835  10/31/19 2200  vancomycin (VANCOREADY) IVPB 1250 mg/250 mL  Status:  Discontinued        1,250 mg 166.7 mL/hr over 90 Minutes Intravenous Every 12 hours 10/31/19 1602 11/03/19 1152   10/29/19 2200  meropenem (MERREM) 1 g in sodium chloride 0.9 % 100 mL IVPB        1 g 200  mL/hr over 30 Minutes Intravenous Every 8 hours 10/29/19 2008 11/05/19 2207   10/29/19 2200  vancomycin (VANCOCIN) IVPB 1000 mg/200 mL premix  Status:  Discontinued        1,000 mg 200 mL/hr over 60 Minutes Intravenous Every 12 hours 10/29/19 2008 10/31/19 1602   10/25/19 1800  fluconazole (DIFLUCAN) IVPB 400 mg  Status:  Discontinued        400 mg 100 mL/hr over 120 Minutes Intravenous Every 24 hours 10/24/19 1750 11/05/19 1850   10/24/19 1830  fluconazole (DIFLUCAN) IVPB 800 mg  Status:  Discontinued        800 mg 200 mL/hr over 120 Minutes Intravenous  Once 10/24/19 1750 10/24/19 1755   10/24/19 1830  fluconazole (DIFLUCAN) IVPB 800 mg        800 mg 100 mL/hr over 240 Minutes Intravenous Every 24 hours 10/24/19 1756 10/24/19 2240   10/12/19 2000  piperacillin-tazobactam (ZOSYN) IVPB 3.375 g  Status:  Discontinued        3.375 g 12.5 mL/hr over 240 Minutes Intravenous Every 8 hours 10/12/19 1911 10/29/19 1945   10/12/19 1708  ceFAZolin (ANCEF) 2-4 GM/100ML-% IVPB       Note to Pharmacy: Bennye Alm   : cabinet override      10/12/19 1708 10/12/19 1849      Subjective  The patient is resting comfortably. No new complaints. His older daughter Matthew Schwartz is at bedside.  Objective   Vitals:  Vitals:   11/15/19 0730 11/15/19 0800  BP:  (!) 138/54  Pulse: (!) 101 94  Resp: (!) 23 (!) 22  Temp:  99.1 F (37.3 C)  SpO2: 97% 95%   Exam:  Constitutional:  . The patient is resting quietly. No acute distress. Respiratory:  . No increased work of breathing. . No wheezes, rales, or rhonchi . No tactile fremitus Cardiovascular:  . Regular rate and rhythm . No murmurs, ectopy, or gallups. . No lateral PMI. No thrills. Abdomen:  . Abdomen is more distended than yesterday. Non-tender. Niel Hummer present in right upper quadrant. Scant output. . No hernias, masses, or organomegaly . Hypoactive bowel sounds.  Musculoskeletal:  . No cyanosis, clubbing, or edema Skin:  . No rashes,  lesions, ulcers . palpation of skin: no induration or nodules Neurologic:  . CN 2-12 intact . Sensation all 4 extremities intact . Motor 5/5 in all extremities. Psychiatric:  . Unable to evaluate as the patient is unable to cooperate with exam.  I have personally reviewed the following:   Today's Data  . Vitals, CMP, CBC  Micro Data  . Blood cultures x 2 . Abdominal X-ray: Colonic ileus  Scheduled Meds: . chlorhexidine  15 mL Mouth Rinse BID  . Chlorhexidine Gluconate Cloth  6 each Topical Daily  . insulin aspart  0-9 Units Subcutaneous Q6H  . insulin detemir  10 Units Subcutaneous Daily  . lidocaine  2 patch Transdermal Q24H  . metoprolol tartrate  5 mg Intravenous Q8H  . pantoprazole (PROTONIX) IV  40 mg Intravenous Q24H  . simethicone  160 mg Oral QID  . sodium chloride flush  10-40 mL  Intracatheter Q12H  . sodium chloride flush  5 mL Intracatheter Q8H   Continuous Infusions: . sodium chloride 10 mL/hr at 11/07/19 1300  . fluconazole (DIFLUCAN) IV Stopped (11/14/19 2359)  . methocarbamol (ROBAXIN) IV Stopped (11/15/19 0531)  . TPN ADULT (ION) 95 mL/hr at 11/15/19 0600  . TPN ADULT (ION)      Principal Problem:   Severe sepsis with septic shock (HCC) Active Problems:   HTN (hypertension)   Malignant neoplasm of prostate (HCC)   Hyperlipidemia   Obstipation   HLD (hyperlipidemia)   Diabetes mellitus type 2, uncontrolled, with complications (HCC)   Perforated bowel ([place])   Perforated duodenal ulcer ([place])   Acute respiratory insufficiency   Elevated troponin level   Acute respiratory failure with hypoxia (HCC)   PVCs (premature ventricular contractions)   Bilateral atelectasis   Acute abdominal pain   Palliative care by specialist   Goals of care, counseling/discussion   General weakness   Epigastric pain   Gastric ulcer   Hypokalemia   NSVT (nonsustained ventricular tachycardia) (HCC)   Diarrhea   NSTEMI (non-ST elevated myocardial infarction) ([place])    Acute deep vein thrombosis (DVT) of left upper extremity (HCC)   Acute systolic CHF (congestive heart failure) (HCC)   Peritonitis (HCC)   Anemia of chronic disease   Hypotension   Nontraumatic retroperitoneal hematoma: 11/08/2019   Hemorrhagic shock (HCC):11/08/2019   Acute respiratory failure with hypoxemia (HCC)   Intraabdominal fluid collection   Abscess   LOS: 34 days   A & P  Septic shock secondary to peritonitis due to perforated duodenal ulcer: The patient is tatus post expiratory laparotomy, omental patch repair of duodenal ulcer per Dr. 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  Currently afebrile.  Normal white count.  Patient noted to have upper GI series 10/28/2019 showing persistent small leak with repeat upper GI series done on 11/03/2018 one of the duodenal bulb likely related to prior surgical repair.  No definite persistent leak identified on today's examination. Patient noted to have fluid collections 10/21/2019 with drain placement with cultures positive for Candida.  Status post drain placement. He is on IV fluconazole. Patient noted to have a T-max of 102.1 on 7/1/ 2021 with a change in antibiotics from IV vancomycin(s/p 5 days) to IV Merrem.  Blood cultures obtained with no growth to date.  Currently afebrile with a normal white count.  Dr. [name] discussed with general surgery, Dr. [name] on 10/30/2019 and was felt that enlarging hematoma was not source of infection as it would drain into the midline wound.  Continue IV PPI, bowel rest, NG tube, TPN.  Patient s/p 8 days of IV Merrem.  IV of Merrem has been discontinued.  Patient afebrile.  Loose stools have decreased.  C. difficile PCR was negative.  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   Right upper quadrant drain intact.  Per general surgery likely need a repeat fistulogram this week to assess for closure. Continue IV fluconazole while drain  in place as cultures were positive for Candida albicans.  Mobilize.  KUB performed this morning demonstrated colonic ileus.  NG tube has been removed.  Will likely need SLP evaluation prior to p.o. intake.  Per IR repeat fistulogram on 11/13/2019.  General surgery recommending ongoing n.p.o. and drain for now.  Per general surgery.  IR following. Repeat fistulogram was performed on 11/13/2019. Report is still pending.  Hypotension/hemorrhagic shock secondary to  large retroperitoneal: Resolved. Patient noted to be hypotensive 5/97/4163 with systolic blood pressures in the 70s requiring transfer to the ICU and pressors of Levophed.  CBC done noted a hemoglobin of 5.9.  CT abdomen and pelvis done consistent with a large retroperitoneal hematoma with evidence of active bleed within the right psoas muscle.  Persistent but decreased size of right rectus muscle hematoma.  Patient was transferred to the ICU and transfused 2 units of FFP, 4 units total of packed red blood cells on 11/08/2019, 11/09/2019.  Levophed subsequently titrated off.  Hemoglobin currently at 9.3 after 4 units of packed red blood cells over the past 48 hours or so.  IV heparin has been discontinued and patient no longer a candidate for anticoagulation.  Clonidine patch discontinued.  Nitroglycerin paste discontinued.  IV Lopressor dose decreased.  Follow H&H.  PCCM consulted and are following.  Colonic ileus: Question role of severe constipation due to opioid use. The patient is on relistor.   Acute hypoxic respiratory failure: The patient is currently saturating at 96% on room air. He is status post extubation 10/15/2019.    Acute systolic heart failure/non-STEMI/coronary artery disease 2000/NSVT: Compensated. Euvolemic.  Blood pressure initially was stable however patient now noted th to be hypotensive on 8/45/3646 with systolic blood pressures in the 70s.  Clonidine patch discontinued.  IV beta-blocker decreased to 5 mg every 4 hours.  Early  on in the hospitalization patient noted to have a 5-6 beat run of nonsustained V. tach however asymptomatic on 11/04/2019.  Potassium at 4.3.  Magnesium at 2.2.  Phosphorus at 4.  IV beta-blocker dose had been increased to 10 mg every 4 hours per cardiology however due to hemorrhagic shock IV Lopressor has been decreased. If develops nonsustained V. tach may up-titrate Lopressor as tolerated by blood pressure back to 10 mg IV every 4 hours. Keep potassium > 4, keep magnesium > 2.   Left upper extremity DVT cephalic/axillary vein: Noted. Likely secondary to PICC line.  IV heparin discontinued 11/08/2019 due to hemorrhagic shock secondary to large retroperitoneal hematoma.  Follow.  Right upper extremity swelling and pain and discomfort: Heparin initially discontinued on 10/29/2019 after enlarging hematoma.  Heparin was resumed without a bolus on 10/31/2019 after Dr. [name] discussed with a specialist.  Right upper extremity discomfort initially felt to be secondary to a DVT however Dopplers negative.  Patient now with large retroperitoneal hematoma with hemorrhagic shock on 11/08/2019 and as such heparin discontinued.  Patient likely not a candidate for anticoagulation. Supportive care.    Well-controlled diabetes mellitus type 2: Hemoglobin A1c 6.6.  Blood glucose level of 112 this morning.  Patient noted not to be a diabetic prior to admission and elevated CBGs likely secondary to TPN.  Sliding scale insulin. Glucoses have been running 96 - 132 in the last 24 hours.  Anemia of critical illness/acute blood loss anemia: Status post transfusion of 2 units packed red blood cells (10/25/2019).  Patient underwent hemorrhagic shock on 11/08/2019 secondary to large retroperitoneal hematoma.  Status post transfusion of 2 units packed red blood cells 11/08/2019 and 2 units of FFP.  Hemoglobin at 6.7 on 11/09/2019 and patient transfused another 2 units of packed red blood cells hemoglobin currently at 9.3. Transfusion  threshold hemoglobin  <7.   Malignancy of the prostate: Noted. Follow up as outpatient.  Tobacco abuse: Tobacco cessation.  Diarrhea: Likely due to TPN. Per RN patient with voluminous loose stools noted in rectal pouch a 6- 7 days ago which  has since improved.  C. difficile PCR negative.  KUB negative.  IV antibiotics discontinued. 4 stools yesterday.  Follow.  Lower extremity pain: Secondary to retroperitoneal hematoma.  Clinical improvement with transfusion of packed red blood cells and FFP in addition to pain management.  Continue Lidoderm patch, IV Robaxin as needed, IV Tylenol.  Supportive care.  Palliative care following.   Prognosis: Patient with multiple comorbidities, complicated course during this hospitalization.  Patient admitted with sepsis secondary to peritonitis, duodenal ulcer with perforation status post repair, complicated by non-STEMI, acute CHF.  Patient with ongoing fistula currently n.p.o. on TPN, left upper extremity DVT placed on heparin however complicated by abdominal hematoma and now with large retroperitoneal hematoma leading to hemorrhagic shock requiring pressors and currently off pressors.  Patient transfused total of 6 units packed red blood cells during this hospitalization, as well as 2 units of FFP.  Palliative care following.  Patient with a poor prognosis. Family has now decided to make the patient DNR. The patient himself has expressed his desire to end aggressive interventions and to seek comfort and support for the remainder of his days. His daughter Matthew Schwartz and his wife are in agreement, but his daughter Matthew Schwartz is wanting further discussions since she was not present at this meeting with palliative care. Palliative care plans to meet with the family again this afternoon. The plan remains for the patient to go home with hospice on Monday or as soon as arrangements can be made.  I have seen and examined this patient myself. I have spent 32 minutes in his  evaluation and care.  DVT prophylaxis: SCDs Code Status: Full Family Communication: Updated patient and daughter at bedside. Disposition: The patient is from home. He is admitted as an inpatient. Anticipate discharge to [facility] or rehab. Barriers to discharge include need to set the patient up for hospice at home and to get daughter Matthew Schwartz on board with the plan.  Status is: Inpatient  Dispo:             Patient From: [place]             Planned Disposition:  LTAC vs SNF             Expected discharge date: 11/27/19             Medically stable for discharge:   Patient currently not medically stable for discharge.  Patient with a hemorrhagic shock on 11/08/2019 resuscitated with blood products and had to be placed on a Levophed drip.  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  Patient in ICU.  he has been downgraded to stepdown status.   Analaura Messler, DO [facility] Direct contact: see [email or web address]  7PM-7AM contact night coverage as above 11/15/2019, 11:27 PM  LOS: 30 days

## 2019-11-16 LAB — COMPREHENSIVE METABOLIC PANEL
ALT: 42 U/L (ref 0–44)
AST: 27 U/L (ref 15–41)
Albumin: 1.8 g/dL — ABNORMAL LOW (ref 3.5–5.0)
Alkaline Phosphatase: 63 U/L (ref 38–126)
Anion gap: 10 (ref 5–15)
BUN: 35 mg/dL — ABNORMAL HIGH (ref 8–23)
CO2: 25 mmol/L (ref 22–32)
Calcium: 8.3 mg/dL — ABNORMAL LOW (ref 8.9–10.3)
Chloride: 107 mmol/L (ref 98–111)
Creatinine, Ser: 0.53 mg/dL — ABNORMAL LOW (ref 0.61–1.24)
GFR calc Af Amer: >60 mL/min (ref >60)
GFR calc non Af Amer: >60 mL/min (ref >60)
Glucose, Bld: 124 mg/dL — ABNORMAL HIGH (ref 70–99)
Potassium: 3.9 mmol/L (ref 3.5–5.1)
Sodium: 142 mmol/L (ref 135–145)
Total Bilirubin: 0.9 mg/dL (ref 0.3–1.2)
Total Protein: 6.4 g/dL — ABNORMAL LOW (ref 6.5–8.1)

## 2019-11-16 LAB — CBC
HCT: 29.2 % — ABNORMAL LOW (ref 39.0–52.0)
Hemoglobin: 9 g/dL — ABNORMAL LOW (ref 13.0–17.0)
MCH: 28.1 pg (ref 26.0–34.0)
MCHC: 30.8 g/dL (ref 30.0–36.0)
MCV: 91.3 fL (ref 80.0–100.0)
Platelets: 328 10*3/uL (ref 150–400)
RBC: 3.2 MIL/uL — ABNORMAL LOW (ref 4.22–5.81)
RDW: 19.3 % — ABNORMAL HIGH (ref 11.5–15.5)
WBC: 10.3 10*3/uL (ref 4.0–10.5)
nRBC: 0 % (ref 0.0–0.2)

## 2019-11-16 MED ORDER — HYDROMORPHONE BOLUS VIA INFUSION
1.0000 mg | INTRAVENOUS | Status: DC | PRN
  Administered 2019-11-16 – 2019-11-17 (×3): 1 mg via INTRAVENOUS
  Filled 2019-11-16: qty 1

## 2019-11-16 MED ORDER — GLYCOPYRROLATE 0.2 MG/ML IJ SOLN: 0.4000 mg | INTRAMUSCULAR | Status: DC | PRN

## 2019-11-16 MED ORDER — SODIUM CHLORIDE 0.9 % IV SOLN
0.1000 mg/h | INTRAVENOUS | Status: AC
  Administered 2019-11-16 – 2019-11-18 (×2): 0.5 mg/h via INTRAVENOUS
  Administered 2019-11-21: 0.25 mg/h via INTRAVENOUS
  Administered 2019-11-24: 0.1 mg/h via INTRAVENOUS
  Filled 2019-11-16 (×5): qty 2.5

## 2019-11-16 MED ORDER — TRAVASOL 10 % IV SOLN
INTRAVENOUS | Status: DC
  Filled 2019-11-16: qty 1208.4

## 2019-11-16 NOTE — Progress Notes (Signed)
PROGRESS NOTE  Matthew Schwartz XFG:[id number] DOB: June 13, 1932 DOA: 10/12/2019 PCP: [name], MD  Brief History   The patient is an 86 yr old [place] man who carries a past medical history significant for NIDDM, HLD, GERD, irritable bowel syndrome (constipation) status post left knee replacement 2009, PTCA 2000-->RCA well as prior prostate CA, former smoker. He was admitted on 10/12/2019 due to severe abdominal pain, s/p ex lap 10/12/2019 for a perforated duodenal ulcer. The patient remained on vent postoperatively. During this period of time he also was found to have NSVT postop. Echo showed Systolic HF with an EF of 40-45% with gloval left ventricular hypokinesis with disproportionately severe inferior lateral wall hypokinesis. There was also Grade I diastolic dysfunction. The patient require pressors in the ICU. Cardiology was consulted. He was extubated on 10/15/2019. On 10/21/2019 the patient had fevers that were found to be due to a postoperative abscess. IR was consulted and they placed a transcutaneous percutaneous drain on 10/22/2019. This effluent was cultures and was found to grow candida. The patient was started on diflucan. CT abdomen was repeated on 10/29/2019 due to a fever spike. It demonstrated an enlarging retroperitoneal hematoma. Unfortunately spiked a fever without cause-found to have on CT 7/1 enlarging hematoma. He has not had high-grade fever since his Antibiotics were adjusted 7/1 vancomycin and Primaxin but continues to have low-grade. Upper GI performed on 10/28/2019 demonstrated a persistent leak at the site of prior anastamosis. Fistulogram was performed on 11/06/2019 which demonstrated findings consistent with a patent fistula or connection with the previously drained abscess. General surgery felt this was minimal and not the cause for his fever. Heparin was held temporarily until 7/3 and restarted cautiously 7/3 after risk-benefit discussion with family. PCCM remains involved in his care.  Repeat fistulogram is planned on 11/13/2019.  Palliative care was consulted. They have met with the family. Initially the family decided to keep patient a full code and to continue to pursue aggressive measures. This morning of 11/12/2019 they have decided to make the patient DNR. Palliative care met again with the daughter [name] and the patient's spouse yesterday. During this meeting the patient himself expressed the wish to pass peacefully and to be done with aggressive medical interventions. [name] and Mrs Bethel agreed. The goal is to have him go home with hospice on Monday. However, the older daughter, Anshu, had the nurse contact me and let me know that before the patient was placed on comfort care, she wanted to be part of these discussions. Dr. Hilma Favors is aware and plans to meet with the entire family when she is available.  Consultants  . General Surgery . Interventional Radiology . PCCM . Palliative Care . Cardiology . Gastroenterology  Procedures  . Rt IC CVL 6/14 - 6/15/ . Rt Radial A-line 6/14 - 6/17 . ETT 6/14 - 6/17 . LUE PICC 10/14/2019 . Percutaneous drainage 10/22/2019 . Exploratory laparotomy with omental patch repair of duodenal ulcer 10/12/2019 . Fistulogram 11/06/2019, 10/30/2019  Antibiotics   Anti-infectives (From admission, onward)   Start     Dose/Rate Route Frequency Ordered Stop   11/13/19 2200  fluconazole (DIFLUCAN) IVPB 400 mg     Discontinue     400 mg 100 mL/hr over 120 Minutes Intravenous Every 24 hours 11/13/19 1917     11/06/19 1800  fluconazole (DIFLUCAN) IVPB 400 mg  Status:  Discontinued        400 mg 100 mL/hr over 120 Minutes Intravenous Every 24 hours 11/05/19 1850 11/13/19 1835  10/31/19 2200  vancomycin (VANCOREADY) IVPB 1250 mg/250 mL  Status:  Discontinued        1,250 mg 166.7 mL/hr over 90 Minutes Intravenous Every 12 hours 10/31/19 1602 11/03/19 1152   10/29/19 2200  meropenem (MERREM) 1 g in sodium chloride 0.9 % 100 mL IVPB        1 g 200  mL/hr over 30 Minutes Intravenous Every 8 hours 10/29/19 2008 11/05/19 2207   10/29/19 2200  vancomycin (VANCOCIN) IVPB 1000 mg/200 mL premix  Status:  Discontinued        1,000 mg 200 mL/hr over 60 Minutes Intravenous Every 12 hours 10/29/19 2008 10/31/19 1602   10/25/19 1800  fluconazole (DIFLUCAN) IVPB 400 mg  Status:  Discontinued        400 mg 100 mL/hr over 120 Minutes Intravenous Every 24 hours 10/24/19 1750 11/05/19 1850   10/24/19 1830  fluconazole (DIFLUCAN) IVPB 800 mg  Status:  Discontinued        800 mg 200 mL/hr over 120 Minutes Intravenous  Once 10/24/19 1750 10/24/19 1755   10/24/19 1830  fluconazole (DIFLUCAN) IVPB 800 mg        800 mg 100 mL/hr over 240 Minutes Intravenous Every 24 hours 10/24/19 1756 10/24/19 2240   10/12/19 2000  piperacillin-tazobactam (ZOSYN) IVPB 3.375 g  Status:  Discontinued        3.375 g 12.5 mL/hr over 240 Minutes Intravenous Every 8 hours 10/12/19 1911 10/29/19 1945   10/12/19 1708  ceFAZolin (ANCEF) 2-4 GM/100ML-% IVPB       Note to Pharmacy: Bennye Alm   : cabinet override      10/12/19 1708 10/12/19 1849      Subjective  The patient is resting comfortably. No new complaints. His older daughter Burnadette Pop is at bedside.  Objective   Vitals:  Vitals:   11/16/19 1200 11/16/19 1425  BP:  (!) 146/78  Pulse:  (!) 101  Resp:  17  Temp: 97.6 F (36.4 C)   SpO2:  94%   Exam:  Constitutional:  . The patient is resting quietly. No acute distress. Respiratory:  . No increased work of breathing. . No wheezes, rales, or rhonchi . No tactile fremitus Cardiovascular:  . Regular rate and rhythm . No murmurs, ectopy, or gallups. . No lateral PMI. No thrills. Abdomen:  . Abdomen is more distended than yesterday. Non-tender. Niel Hummer present in right upper quadrant. Scant output. . No hernias, masses, or organomegaly . Hypoactive bowel sounds.  Musculoskeletal:  . No cyanosis, clubbing, or edema Skin:  . No rashes, lesions,  ulcers . palpation of skin: no induration or nodules Neurologic:  . CN 2-12 intact . Sensation all 4 extremities intact . Motor 5/5 in all extremities. Psychiatric:  . Unable to evaluate as the patient is unable to cooperate with exam.  I have personally reviewed the following:   Today's Data  . Vitals, CMP, CBC  Micro Data  . Blood cultures x 2 . Abdominal X-ray: Colonic ileus  Scheduled Meds: . chlorhexidine  15 mL Mouth Rinse BID  . Chlorhexidine Gluconate Cloth  6 each Topical Daily  . insulin aspart  0-9 Units Subcutaneous Q6H  . insulin detemir  10 Units Subcutaneous Daily  . lidocaine  2 patch Transdermal Q24H  . metoprolol tartrate  5 mg Intravenous Q8H  . pantoprazole (PROTONIX) IV  40 mg Intravenous Q24H  . simethicone  160 mg Oral QID  . sodium chloride flush  10-40 mL Intracatheter Q12H  .  sodium chloride flush  5 mL Intracatheter Q8H   Continuous Infusions: . sodium chloride 10 mL/hr at 11/07/19 1300  . fluconazole (DIFLUCAN) IV Stopped (11/15/19 2326)  . methocarbamol (ROBAXIN) IV Stopped (11/16/19 0116)  . TPN ADULT (ION) 95 mL/hr at 11/16/19 0600  . TPN ADULT (ION)      Principal Problem:   Severe sepsis with septic shock (HCC) Active Problems:   HTN (hypertension)   Malignant neoplasm of prostate (HCC)   Hyperlipidemia   Obstipation   HLD (hyperlipidemia)   Diabetes mellitus type 2, uncontrolled, with complications (HCC)   Perforated bowel ([place])   Perforated duodenal ulcer ([place])   Acute respiratory insufficiency   Elevated troponin level   Acute respiratory failure with hypoxia (HCC)   PVCs (premature ventricular contractions)   Bilateral atelectasis   Acute abdominal pain   Palliative care by specialist   Goals of care, counseling/discussion   General weakness   Epigastric pain   Gastric ulcer   Hypokalemia   NSVT (nonsustained ventricular tachycardia) (HCC)   Diarrhea   NSTEMI (non-ST elevated myocardial infarction) ([place])   Acute deep  vein thrombosis (DVT) of left upper extremity (HCC)   Acute systolic CHF (congestive heart failure) (HCC)   Peritonitis (HCC)   Anemia of chronic disease   Hypotension   Nontraumatic retroperitoneal hematoma: 11/08/2019   Hemorrhagic shock (HCC):11/08/2019   Acute respiratory failure with hypoxemia (HCC)   Intraabdominal fluid collection   Abscess   LOS: 35 days   A & P  Septic shock secondary to peritonitis due to perforated duodenal ulcer: The patient is tatus post expiratory laparotomy, omental patch repair of duodenal ulcer per Dr. 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  Currently afebrile.  Normal white count.  Patient noted to have upper GI series 10/28/2019 showing persistent small leak with repeat upper GI series done on 11/03/2018 one of the duodenal bulb likely related to prior surgical repair.  No definite persistent leak identified on today's examination. Patient noted to have fluid collections 10/21/2019 with drain placement with cultures positive for Candida.  Status post drain placement. He is on IV fluconazole. Patient noted to have a T-max of 102.1 on 7/1/ 2021 with a change in antibiotics from IV vancomycin(s/p 5 days) to IV Merrem.  Blood cultures obtained with no growth to date.  Currently afebrile with a normal white count.  Dr. [name] discussed with general surgery, Dr. [name] on 10/30/2019 and was felt that enlarging hematoma was not source of infection as it would drain into the midline wound.  Continue IV PPI, bowel rest, NG tube, TPN.  Patient s/p 8 days of IV Merrem.  IV of Merrem has been discontinued.  Patient afebrile.  Loose stools have decreased.  C. difficile PCR was negative.  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   Right upper quadrant drain intact.  Per general surgery likely need a repeat fistulogram this week to assess for closure. Continue IV fluconazole while drain in place as  cultures were positive for Candida albicans.  Mobilize.  KUB performed this morning demonstrated colonic ileus.  NG tube has been removed.  Will likely need SLP evaluation prior to p.o. intake.  Per IR repeat fistulogram on 11/13/2019.  General surgery recommending ongoing n.p.o. and drain for now.  Per general surgery.  IR following. Repeat fistulogram was performed on 11/13/2019. 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 Drain catheter has been left  in place.  Hypotension/hemorrhagic shock secondary to large retroperitoneal: Resolved. Patient noted to be hypotensive 9/38/1829 with systolic blood pressures in the 70s requiring transfer to the ICU and pressors of Levophed.  CBC done noted a hemoglobin of 5.9.  CT abdomen and pelvis done consistent with a large retroperitoneal hematoma with evidence of active bleed within the right psoas muscle.  Persistent but decreased size of right rectus muscle hematoma.  Patient was transferred to the ICU and transfused 2 units of FFP, 4 units total of packed red blood cells on 11/08/2019, 11/09/2019.  Levophed subsequently titrated off.  Hemoglobin currently at 9.3 after 4 units of packed red blood cells over the past 48 hours or so.  IV heparin has been discontinued and patient no longer a candidate for anticoagulation.  Clonidine patch discontinued.  Nitroglycerin paste discontinued.  IV Lopressor dose decreased.  Follow H&H.  PCCM consulted and were following. They have now signed off.  Colonic ileus: Question role of severe constipation due to opioid use. The patient is on relistor. Abdomen appears more distended each day.  Acute hypoxic respiratory failure: The patient is currently saturating at 96% on room air. He is status post extubation 10/15/2019.    Acute systolic heart failure/non-STEMI/coronary artery disease 2000/NSVT: Compensated. Euvolemic.  Blood pressure initially was stable however patient now noted th to be hypotensive on 9/37/1696 with systolic  blood pressures in the 70s.  Clonidine patch discontinued.  IV beta-blocker decreased to 5 mg every 4 hours.  Early on in the hospitalization patient noted to have a 5-6 beat run of nonsustained V. tach however asymptomatic on 11/04/2019.  Potassium at 4.3.  Magnesium at 2.2.  Phosphorus at 4.  IV beta-blocker dose had been increased to 10 mg every 4 hours per cardiology however due to hemorrhagic shock IV Lopressor has been decreased. If develops nonsustained V. tach may up-titrate Lopressor as tolerated by blood pressure back to 10 mg IV every 4 hours. Keep potassium > 4, keep magnesium > 2.   Left upper extremity DVT cephalic/axillary vein: Noted. Likely secondary to PICC line.  IV heparin discontinued 11/08/2019 due to hemorrhagic shock secondary to large retroperitoneal hematoma.  Follow.  Right upper extremity swelling and pain and discomfort: Heparin initially discontinued on 10/29/2019 after enlarging hematoma.  Heparin was resumed without a bolus on 10/31/2019 after Dr. [name] discussed with a specialist.  Right upper extremity discomfort initially felt to be secondary to a DVT however Dopplers negative.  Patient now with large retroperitoneal hematoma with hemorrhagic shock on 11/08/2019 and as such heparin discontinued.  Patient likely not a candidate for anticoagulation. Supportive care.    Well-controlled diabetes mellitus type 2: Hemoglobin A1c 6.6.  Blood glucose level of 112 this morning.  Patient noted not to be a diabetic prior to admission and elevated CBGs likely secondary to TPN.  Sliding scale insulin. Glucoses have been running 108 - 145 in the last 24 hours.  Anemia of critical illness/acute blood loss anemia: Status post transfusion of 2 units packed red blood cells (10/25/2019).  Patient underwent hemorrhagic shock on 11/08/2019 secondary to large retroperitoneal hematoma.  Status post transfusion of 2 units packed red blood cells 11/08/2019 and 2 units of FFP.  Hemoglobin at 6.7 on  11/09/2019 and patient transfused another 2 units of packed red blood cells hemoglobin currently at 9.0. Transfusion threshold hemoglobin  <7.   Malignancy of the prostate: Noted. Follow up as outpatient.  Tobacco abuse: Tobacco cessation.  Diarrhea: Likely due to TPN.  Per RN patient with voluminous loose stools noted in rectal pouch a 6- 7 days ago which has since improved.  C. difficile PCR negative.  KUB negative.  IV antibiotics discontinued. 4 stools yesterday.  Follow.  Lower extremity pain: Secondary to retroperitoneal hematoma.  Clinical improvement with transfusion of packed red blood cells and FFP in addition to pain management.  Continue Lidoderm patch, IV Robaxin as needed, IV Tylenol.  Supportive care.  Palliative care following.   Prognosis: Patient with multiple comorbidities, complicated course during this hospitalization.  Patient admitted with sepsis secondary to peritonitis, duodenal ulcer with perforation status post repair, complicated by non-STEMI, acute CHF.  Patient with ongoing fistula currently n.p.o. on TPN, left upper extremity DVT placed on heparin however complicated by abdominal hematoma and now with large retroperitoneal hematoma leading to hemorrhagic shock requiring pressors and currently off pressors.  Patient transfused total of 6 units packed red blood cells during this hospitalization, as well as 2 units of FFP.  Palliative care following.  Patient with a poor prognosis. Family has now decided to make the patient DNR. The patient himself has expressed his desire to end aggressive interventions and to seek comfort and support for the remainder of his days. His daughter [name] and his wife are in agreement, but his daughter Cardell Peach is wanting further discussions since she was not present at this meeting with palliative care. Palliative care plans to meet with the family again this afternoon. The plan remains for the patient to go home with hospice on Monday or as soon  as arrangements can be made.  I have seen and examined this patient myself. I have spent 30 minutes in his evaluation and care.  DVT prophylaxis: SCDs Code Status: Full Family Communication: Updated patient and daughter at bedside. Disposition: The patient is from home. He is admitted as an inpatient. Anticipate discharge to [facility] or rehab. Barriers to discharge include need to set the patient up for hospice at home and to get daughter Cardell Peach on board with the plan.  Status is: Inpatient  Dispo:             Patient From: [facility]             Planned Disposition:  LTAC vs SNF             Expected discharge date: 11/27/19             Medically stable for discharge:   Patient currently not medically stable for discharge.  Patient with a hemorrhagic shock on 11/08/2019 resuscitated with blood products and had to be placed on a Levophed drip.  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  Patient in ICU.  he has been downgraded to stepdown status.   Nalany Steedley, DO [facility] Direct contact: see [email or web address]  7PM-7AM contact night coverage as above 11/16/2019, 3:32 PM  LOS: 30 days

## 2019-11-16 NOTE — Progress Notes (Addendum)
35 Days Post-Op  Subjective: Mostly sleeping.  Doesn't talk.  Family present.    ROS: See above, otherwise other systems negative  Objective: Vital signs in last 24 hours: Temp:  [98.7 F (37.1 C)-99.1 F (37.3 C)] 98.7 F (37.1 C) (07/19 0400) Pulse Rate:  [94-106] 104 (07/19 0849) Resp:  [12-34] 21 (07/19 0849) BP: (129-177)/(61-85) 141/70 (07/19 0849) SpO2:  [95 %-100 %] 97 % (07/19 0849) Weight:  [94.5 kg] 94.5 kg (07/19 0500) Last BM Date: 11/15/19  Intake/Output from previous day: 07/18 0701 - 07/19 0700 In: 2629.7 [I.V.:2281.9; IV Piggyback:342.7] Out: 200 [Urine:200] Intake/Output this shift: No intake/output data recorded.  PE: Abd: distended, stable, midline wound is clean and packed.  Some BS, drain in place with some tan purulent drainage, but minimal  Lab Results:  Recent Labs    11/15/19 0415 11/16/19 0430  WBC 10.7* 10.3  HGB 9.3* 9.0*  HCT 30.4* 29.2*  PLT 313 328   BMET Recent Labs    11/15/19 0415 11/16/19 0430  NA 142 142  K 4.0 3.9  CL 107 107  CO2 26 25  GLUCOSE 140* 124*  BUN 37* 35*  CREATININE 0.61 0.53*  CALCIUM 8.2* 8.3*   PT/INR No results for input(s): LABPROT, INR in the last 72 hours. CMP     Component Value Date/Time   NA 142 11/16/2019 0430   NA 137 11/02/2016 1509   K 3.9 11/16/2019 0430   K 4.4 11/02/2016 1509   CL 107 11/16/2019 0430   CO2 25 11/16/2019 0430   CO2 27 11/02/2016 1509   GLUCOSE 124 (H) 11/16/2019 0430   GLUCOSE 153 (H) 11/02/2016 1509   BUN 35 (H) 11/16/2019 0430   BUN 16.7 11/02/2016 1509   CREATININE 0.53 (L) 11/16/2019 0430   CREATININE 0.9 11/02/2016 1509   CALCIUM 8.3 (L) 11/16/2019 0430   CALCIUM 9.1 11/02/2016 1509   PROT 6.4 (L) 11/16/2019 0430   PROT 7.0 11/02/2016 1509   ALBUMIN 1.8 (L) 11/16/2019 0430   ALBUMIN 3.6 11/02/2016 1509   AST 27 11/16/2019 0430   AST 15 11/02/2016 1509   ALT 42 11/16/2019 0430   ALT 12 11/02/2016 1509   ALKPHOS 63 11/16/2019 0430   ALKPHOS 58  11/02/2016 1509   BILITOT 0.9 11/16/2019 0430   BILITOT 0.71 11/02/2016 1509   GFRNONAA >60 11/16/2019 0430   GFRAA >60 11/16/2019 0430   Lipase     Component Value Date/Time   LIPASE 44 10/12/2019 0320       Studies/Results: No results found.  Anti-infectives: Anti-infectives (From admission, onward)    Start     Dose/Rate Route Frequency Ordered Stop   11/13/19 2200  fluconazole (DIFLUCAN) IVPB 400 mg     Discontinue     400 mg 100 mL/hr over 120 Minutes Intravenous Every 24 hours 11/13/19 1917     11/06/19 1800  fluconazole (DIFLUCAN) IVPB 400 mg  Status:  Discontinued        400 mg 100 mL/hr over 120 Minutes Intravenous Every 24 hours 11/05/19 1850 11/13/19 1835   10/31/19 2200  vancomycin (VANCOREADY) IVPB 1250 mg/250 mL  Status:  Discontinued        1,250 mg 166.7 mL/hr over 90 Minutes Intravenous Every 12 hours 10/31/19 1602 11/03/19 1152   10/29/19 2200  meropenem (MERREM) 1 g in sodium chloride 0.9 % 100 mL IVPB        1 g 200 mL/hr over 30 Minutes Intravenous Every 8 hours  10/29/19 2008 11/05/19 2207   10/29/19 2200  vancomycin (VANCOCIN) IVPB 1000 mg/200 mL premix  Status:  Discontinued        1,000 mg 200 mL/hr over 60 Minutes Intravenous Every 12 hours 10/29/19 2008 10/31/19 1602   10/25/19 1800  fluconazole (DIFLUCAN) IVPB 400 mg  Status:  Discontinued        400 mg 100 mL/hr over 120 Minutes Intravenous Every 24 hours 10/24/19 1750 11/05/19 1850   10/24/19 1830  fluconazole (DIFLUCAN) IVPB 800 mg  Status:  Discontinued        800 mg 200 mL/hr over 120 Minutes Intravenous  Once 10/24/19 1750 10/24/19 1755   10/24/19 1830  fluconazole (DIFLUCAN) IVPB 800 mg        800 mg 100 mL/hr over 240 Minutes Intravenous Every 24 hours 10/24/19 1756 10/24/19 2240   10/12/19 2000  piperacillin-tazobactam (ZOSYN) IVPB 3.375 g  Status:  Discontinued        3.375 g 12.5 mL/hr over 240 Minutes Intravenous Every 8 hours 10/12/19 1911 10/29/19 1945   10/12/19 1708   ceFAZolin (ANCEF) 2-4 GM/100ML-% IVPB       Note to Pharmacy: Bennye Alm   : cabinet override      10/12/19 1708 10/12/19 1849        Assessment/Plan Tobacco abuse Prostate CA DM2 LUE DVT  RP hematoma - heparin D/C-ed 7/12, no longer candidate for anticoagulation    Perforated duodenal ulcer s/p Procedure(s): EXPLORATORY LAPAROTOMY omental patch repair perforated duodenal ulcer 10/12/2019 Dr. [name] - POD#35 - persistent duodenal fistula to RUQ fluid collection, s/p IR drain placement 6/24 - given transition to comfort/hospice care, we will allow patient to eat and drink for comfort.  This has been thoroughly discussed with the family.  They understand that this will further cause his fistula to not heal, but given their desire for transition, this is a better way to keep him comfortable. -our office info will be provided, but no definitive follow up will be needed at this time -cont WD dressing changes to his wound -may DC TNA  FEN - diet for comfort/suspect DC of TNA VTE - none due to RTP hematoma ID - none currently  Dispo - we will sign off at this time and defer further care and placement to medical and palliative services.   LOS: 35 days    Matthew Schwartz , [facility] Surgery 11/16/2019, 9:56 AM Please see Amion for pager number during day hours 7:00am-4:30pm or 7:00am -11:30am on weekends  Agree with above.  Alphonsa Overall, MD, [facility] Surgery Office phone:  [phone]

## 2019-11-16 NOTE — Progress Notes (Signed)
Met with patient's two daughters and his wife. He is much more lethargic today-he is pale, difficulty focusing. His lungs are becoming more congested.  1. Comfort is our main goal.  2. Move to [place] he looks like he is declining fairly rapidly. 3. Stop TPN-he is becoming congested, this is causing harm- continue to offer comfort feeding if he is able to take it. 4. Foley for comfort 5. Hydromorphone Infusion 0.19m/hr, 168mbolus prn 6. Glycopyrolate for secretions 7. Discussed hospice care-at this point he would need hospice facility level care for symptom management- will assess him for his ability to be safely transported.  ElLane HackerDO Palliative Medicine   Time: 35 min Greater than 50%  of this time was spent counseling and coordinating care related to the above assessment and plan.

## 2019-11-16 NOTE — Discharge Instructions (Signed)
Drain should remain to gravity bag  Normal saline wet to dry dressing changes daily to keep wound moist

## 2019-11-16 NOTE — Progress Notes (Signed)
PHARMACY - TOTAL PARENTERAL NUTRITION CONSULT NOTE   Indication: Prolonged ileus  Patient Measurements: Height: 5\' 11"  (180.3 cm) Weight: 94.5 kg (208 lb 5.4 oz) IBW/kg (Calculated) : 75.3 TPN AdjBW (KG): 91.9 Body mass index is 29.06 kg/m. Usual Weight: 90.7 kg   Assessment:  Pharmacy consulted to start TPN in 86 yo male with prolonged ileus. Pt underwent exploratory laparotomy on 6/14 which revealed perforated duodenal ulcer.   Glucose / Insulin: hx of DM - on metformin PTA.  - on rSSI q6h (CBG range 124-145) - 5 units of SSI required in past 24h; Insulin in TPN 30 units/day - Levemir 10 units decr to daily per CCM started 6/18  Electrolytes: K 3.9 (target >/= 4.0) . Magnesium 2.2 (target >/= 2.0). All other lytes, including Corrected Ca, WNL.   Renal: BUN elevated, SCr wnl  LFTs / TGs: WNL/TG 65 (7/5), 73 (7/12) Prealbumin / albumin: Prealbumin 8.3 (7/5), 9.6 (6/28), 9.0 (6/21), 10.9 (7/12) Albumin 1.8 Intake / Output; MIVF: NG removed; only 200 ml UOP GI Imaging:  - 6/21 Upper GI series: small leak from superior margin of duodenal bulb. - 6/23 CT abdomen: 4.3 cm air-fluid collection in the right upper quadrant of the abdomen between the duodenal bulb and gallbladder,not containing enteric contrast. No residual leak from the duodenal bulb identified. - 7/1 Abdominal CT: large irregular high density noted most consistent with enlarging subcutaneous or IM hematoma - 7/6 Upper GI showed no leak.  Will need repeat sinogram without fistula to start oral feeds to maximize chance of this healing.   7/16 Sinogram: no result available Surgeries / Procedures:  - 6/14 Exploratory laparotomy which revealed perforated duodenal ulcer - 6/24 RUQ drain placed > 10 ml > cultured - 6/25 removal R abd JP drain  - 7/2 R abd drain placed  Central access:  PICC Line 6/15 TPN start date: 6/15  Nutritional Goals: per RD recommendation on 7/6: Kcal:  2200-2400 Protein:  115-125 grams Fluid:   >2L/d  Goal TPN rate 95 mL/hr (provides 121 g of protein and 2262 kcals per day)  Current Nutrition:  NPO   Plan:  Family not ready to let patient eat, awaiting Sinogram results, family not ready for d/c Monday with hospice care Note that standing TPN lab orders discontinued   Continue TPN at 95 mL/hr at 1800, goal rate   Electrolytes in TPN:   85 mEq/L of Na, 100 mEq/L of K , 4 mEq/L of Ca, 12 mEq/L of Magnesium, and 15 mmol/L of Phos   Cl:Ac ratio max Acetate  Standard MVI and trace elements to TPN  Continue sensitive Novolog q6h SSI, levemir and adjust as needed   Continue regular insulin 30  units/day in TPN. Aiming to prevent hypoglycemia.    30 units in TPN & 10 units Levemir daily deliver about 1.6 units/hr insulin, reduce as needed  CMET, Mg, Phos, TG in AM  Ulice Dash, PharmD, BCPS   11/16/2019, 8:16 AM

## 2019-11-17 NOTE — Progress Notes (Signed)
PROGRESS NOTE  Vegas Fritze DQQ:[id number] DOB: [birth year] DOA: 10/12/2019 PCP: [name], MD  Brief History   The patient is an 86 yr old [place] man who carries a past medical history significant for NIDDM, HLD, GERD, irritable bowel syndrome (constipation) status post left knee replacement 2009, PTCA 2000-->RCA well as prior prostate CA, former smoker. He was admitted on 10/12/2019 due to severe abdominal pain, s/p ex lap 10/12/2019 for a perforated duodenal ulcer. The patient remained on vent postoperatively. During this period of time he also was found to have NSVT postop. Echo showed Systolic HF with an EF of 40-45% with gloval left ventricular hypokinesis with disproportionately severe inferior lateral wall hypokinesis. There was also Grade I diastolic dysfunction. The patient require pressors in the ICU. Cardiology was consulted. He was extubated on 10/15/2019. On 10/21/2019 the patient had fevers that were found to be due to a postoperative abscess. IR was consulted and they placed a transcutaneous percutaneous drain on 10/22/2019. This effluent was cultures and was found to grow candida. The patient was started on diflucan. CT abdomen was repeated on 10/29/2019 due to a fever spike. It demonstrated an enlarging retroperitoneal hematoma. Unfortunately spiked a fever without cause-found to have on CT 7/1 enlarging hematoma. He has not had high-grade fever since his Antibiotics were adjusted 7/1 vancomycin and Primaxin but continues to have low-grade. Upper GI performed on 10/28/2019 demonstrated a persistent leak at the site of prior anastamosis. Fistulogram was performed on 11/06/2019 which demonstrated findings consistent with a patent fistula or connection with the previously drained abscess. General surgery felt this was minimal and not the cause for his fever. Heparin was held temporarily until 7/3 and restarted cautiously 7/3 after risk-benefit discussion with family. PCCM remains involved in his care.  Repeat fistulogram is planned on 11/13/2019.  Palliative care was consulted. They have met with the family. Initially the family decided to keep patient a full code and to continue to pursue aggressive measures. This morning of 11/12/2019 they have decided to make the patient DNR. Palliative care met again with the daughter [name] and the patient's spouse yesterday. During this meeting the patient himself expressed the wish to pass peacefully and to be done with aggressive medical interventions. [name] and Mrs Creasy agreed. The goal is to have him go home with hospice on Monday. However, the older daughter, Anshu, had the nurse contact me and let me know that before the patient was placed on comfort care, she wanted to be part of these discussions.  Dr. Hilma Favors was able to meet again with the family. The patient has continued to decline significantly, and they have decided to go forward with transition to comfort care in hospital and plans to discharge to residential hospice. It is clear now that the patient requires too much care to reasonably be cared for at home. Hospice has been contacted.  Consultants  . General Surgery . Interventional Radiology . PCCM . Palliative Care . Cardiology . Gastroenterology  Procedures  . Rt IC CVL 6/14 - 6/15/ . Rt Radial A-line 6/14 - 6/17 . ETT 6/14 - 6/17 . LUE PICC 10/14/2019 . Percutaneous drainage 10/22/2019 . Exploratory laparotomy with omental patch repair of duodenal ulcer 10/12/2019 . Fistulogram 11/06/2019, 10/30/2019  Antibiotics   Anti-infectives (From admission, onward)   Start     Dose/Rate Route Frequency Ordered Stop   11/13/19 2200  fluconazole (DIFLUCAN) IVPB 400 mg     Discontinue     400 mg 100 mL/hr over 120 Minutes  Intravenous Every 24 hours 11/13/19 1917     11/06/19 1800  fluconazole (DIFLUCAN) IVPB 400 mg  Status:  Discontinued        400 mg 100 mL/hr over 120 Minutes Intravenous Every 24 hours 11/05/19 1850 11/13/19 1835   10/31/19  2200  vancomycin (VANCOREADY) IVPB 1250 mg/250 mL  Status:  Discontinued        1,250 mg 166.7 mL/hr over 90 Minutes Intravenous Every 12 hours 10/31/19 1602 11/03/19 1152   10/29/19 2200  meropenem (MERREM) 1 g in sodium chloride 0.9 % 100 mL IVPB        1 g 200 mL/hr over 30 Minutes Intravenous Every 8 hours 10/29/19 2008 11/05/19 2207   10/29/19 2200  vancomycin (VANCOCIN) IVPB 1000 mg/200 mL premix  Status:  Discontinued        1,000 mg 200 mL/hr over 60 Minutes Intravenous Every 12 hours 10/29/19 2008 10/31/19 1602   10/25/19 1800  fluconazole (DIFLUCAN) IVPB 400 mg  Status:  Discontinued        400 mg 100 mL/hr over 120 Minutes Intravenous Every 24 hours 10/24/19 1750 11/05/19 1850   10/24/19 1830  fluconazole (DIFLUCAN) IVPB 800 mg  Status:  Discontinued        800 mg 200 mL/hr over 120 Minutes Intravenous  Once 10/24/19 1750 10/24/19 1755   10/24/19 1830  fluconazole (DIFLUCAN) IVPB 800 mg        800 mg 100 mL/hr over 240 Minutes Intravenous Every 24 hours 10/24/19 1756 10/24/19 2240   10/12/19 2000  piperacillin-tazobactam (ZOSYN) IVPB 3.375 g  Status:  Discontinued        3.375 g 12.5 mL/hr over 240 Minutes Intravenous Every 8 hours 10/12/19 1911 10/29/19 1945   10/12/19 1708  ceFAZolin (ANCEF) 2-4 GM/100ML-% IVPB       Note to Pharmacy: Bennye Alm   : cabinet override      10/12/19 1708 10/12/19 1849      Subjective  The patient is resting comfortably. He is awake and enjoying having his family around him.  No new complaints. His son and daughter are at his bedside.  Objective   Vitals:  Vitals:   11/16/19 1600 11/16/19 1924  BP: (!) 152/67 136/74  Pulse: 91 83  Resp: (!) 22 20  Temp:  98.3 F (36.8 C)  SpO2: 98% 96%   Exam:  Constitutional:  . The patient is resting quietly. No acute distress. Respiratory:  . No increased work of breathing. . No wheezes, rales, or rhonchi . No tactile fremitus Cardiovascular:  . Regular rate and rhythm . No murmurs,  ectopy, or gallups. . No lateral PMI. No thrills. Abdomen:  . Abdomen is more distended than yesterday. Non-tender. Niel Hummer present in right upper quadrant. Scant output. . No hernias, masses, or organomegaly . Hypoactive bowel sounds.  Musculoskeletal:  . No cyanosis, clubbing, or edema Skin:  . No rashes, lesions, ulcers . palpation of skin: no induration or nodules Neurologic:  . CN 2-12 intact . Sensation all 4 extremities intact . Motor 5/5 in all extremities. Psychiatric:  . Unable to evaluate as the patient is unable to cooperate with exam.  I have personally reviewed the following:   Today's Data  . Orthoptist  . Blood cultures x 2 . Abdominal X-ray: Colonic ileus  Scheduled Meds: . chlorhexidine  15 mL Mouth Rinse BID  . Chlorhexidine Gluconate Cloth  6 each Topical Daily  . lidocaine  2 patch Transdermal  Q24H  . metoprolol tartrate  5 mg Intravenous Q8H  . pantoprazole (PROTONIX) IV  40 mg Intravenous Q24H  . simethicone  160 mg Oral QID  . sodium chloride flush  10-40 mL Intracatheter Q12H  . sodium chloride flush  5 mL Intracatheter Q8H   Continuous Infusions: . sodium chloride 10 mL/hr at 11/07/19 1300  . fluconazole (DIFLUCAN) IV 400 mg (11/16/19 2249)  . HYDROmorphone 0.5 mg/hr (11/16/19 1733)  . methocarbamol (ROBAXIN) IV 500 mg (11/17/19 0119)    Principal Problem:   Severe sepsis with septic shock (HCC) Active Problems:   HTN (hypertension)   Malignant neoplasm of prostate (HCC)   Hyperlipidemia   Obstipation   HLD (hyperlipidemia)   Diabetes mellitus type 2, uncontrolled, with complications (HCC)   Perforated bowel (HCC)   Perforated duodenal ulcer ([place])   Acute respiratory insufficiency   Elevated troponin level   Acute respiratory failure with hypoxia (HCC)   PVCs (premature ventricular contractions)   Bilateral atelectasis   Acute abdominal pain   Palliative care by specialist   Goals of care, counseling/discussion    General weakness   Epigastric pain   Gastric ulcer   Hypokalemia   NSVT (nonsustained ventricular tachycardia) (HCC)   Diarrhea   NSTEMI (non-ST elevated myocardial infarction) ([place])   Acute deep vein thrombosis (DVT) of left upper extremity (HCC)   Acute systolic CHF (congestive heart failure) (HCC)   Peritonitis (HCC)   Anemia of chronic disease   Hypotension   Nontraumatic retroperitoneal hematoma: 11/08/2019   Hemorrhagic shock (HCC):11/08/2019   Acute respiratory failure with hypoxemia (HCC)   Intraabdominal fluid collection   Abscess   LOS: 36 days   A & P  Septic shock secondary to peritonitis due to perforated duodenal ulcer: The patient is tatus post expiratory laparotomy, omental patch repair of duodenal ulcer per Dr. 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  Currently afebrile.  Normal white count.  Patient noted to have upper GI series 10/28/2019 showing persistent small leak with repeat upper GI series done on 11/03/2018 one of the duodenal bulb likely related to prior surgical repair.  No definite persistent leak identified on today's examination. Patient noted to have fluid collections 10/21/2019 with drain placement with cultures positive for Candida.  Status post drain placement. He is on IV fluconazole. Patient noted to have a T-max of 102.1 on 7/1/ 2021 with a change in antibiotics from IV vancomycin(s/p 5 days) to IV Merrem.  Blood cultures obtained with no growth to date.  Currently afebrile with a normal white count.  Dr. [name] discussed with general surgery, Dr. [name] on 10/30/2019 and was felt that enlarging hematoma was not source of infection as it would drain into the midline wound.  Continue IV PPI, bowel rest, NG tube, TPN.  Patient s/p 8 days of IV Merrem.  IV of Merrem has been discontinued.  Patient afebrile.  Loose stools have decreased.  C. difficile PCR was negative.  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   Right upper quadrant drain intact.  Per general surgery likely need a repeat fistulogram this week to assess for closure. Continue IV fluconazole while drain in place as cultures were positive for Candida albicans.  Mobilize.  KUB performed this morning demonstrated colonic ileus.  NG tube has been removed.  Will likely need SLP evaluation prior to p.o. intake.  Per IR repeat fistulogram on 11/13/2019.  General surgery recommending  ongoing n.p.o. and drain for now.  Per general surgery.  IR following. Repeat fistulogram was performed on 11/13/2019. 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 Drain catheter has been left in place. Comfort care only.  Hypotension/hemorrhagic shock secondary to large retroperitoneal: Resolved. Patient noted to be hypotensive 0/34/7425 with systolic blood pressures in the 70s requiring transfer to the ICU and pressors of Levophed.  CBC done noted a hemoglobin of 5.9.  CT abdomen and pelvis done consistent with a large retroperitoneal hematoma with evidence of active bleed within the right psoas muscle.  Persistent but decreased size of right rectus muscle hematoma.  Patient was transferred to the ICU and transfused 2 units of FFP, 4 units total of packed red blood cells on 11/08/2019, 11/09/2019.  Levophed subsequently titrated off.  Hemoglobin currently at 9.3 after 4 units of packed red blood cells over the past 48 hours or so.  IV heparin has been discontinued and patient no longer a candidate for anticoagulation.  Clonidine patch discontinued.  Nitroglycerin paste discontinued.  IV Lopressor dose decreased.  Follow H&H.  PCCM consulted and were following. They have now signed off. Comfort care only.  Colonic ileus: Question role of severe constipation due to opioid use. The patient is on relistor. Abdomen appears more distended each day. Comfort care only.  Acute hypoxic respiratory failure: The patient is currently saturating at 96% on room  air. He is status post extubation 10/15/2019.  Comfort care only.  Acute systolic heart failure/non-STEMI/coronary artery disease 2000/NSVT: Compensated. Euvolemic.  Blood pressure initially was stable however patient now noted th to be hypotensive on 9/56/3875 with systolic blood pressures in the 70s.  Clonidine patch discontinued.  IV beta-blocker decreased to 5 mg every 4 hours.  Early on in the hospitalization patient noted to have a 5-6 beat run of nonsustained V. tach however asymptomatic on 11/04/2019.  Potassium at 4.3.  Magnesium at 2.2.  Phosphorus at 4.  IV beta-blocker dose had been increased to 10 mg every 4 hours per cardiology however due to hemorrhagic shock IV Lopressor has been decreased. If develops nonsustained V. tach may up-titrate Lopressor as tolerated by blood pressure back to 10 mg IV every 4 hours. Keep potassium > 4, keep magnesium > 2. Comfort care only.  Left upper extremity DVT cephalic/axillary vein: Noted. Likely secondary to PICC line.  IV heparin discontinued 11/08/2019 due to hemorrhagic shock secondary to large retroperitoneal hematoma.  Comfort care only.  Right upper extremity swelling and pain and discomfort: Heparin initially discontinued on 10/29/2019 after enlarging hematoma.  Heparin was resumed without a bolus on 10/31/2019 after Dr. [name] discussed with a specialist.  Right upper extremity discomfort initially felt to be secondary to a DVT however Dopplers negative.  Patient now with large retroperitoneal hematoma with hemorrhagic shock on 11/08/2019 and as such heparin discontinued.  Patient likely not a candidate for anticoagulation. Supportive care.    Well-controlled diabetes mellitus type 2: Hemoglobin A1c 6.6.  Blood glucose level of 112 this morning.  TPN discontinued.  Comfort care only.  Anemia of critical illness/acute blood loss anemia: Status post transfusion of 2 units packed red blood cells (10/25/2019).  Patient underwent hemorrhagic shock on  11/08/2019 secondary to large retroperitoneal hematoma.  Status post transfusion of 2 units packed red blood cells 11/08/2019 and 2 units of FFP.  Hemoglobin at 6.7 on 11/09/2019 and patient transfused another 2 units of packed red blood cells hemoglobin currently at 9.0. Transfusion threshold hemoglobin  <7. Comfort care  only.  Malignancy of the prostate: Noted. Follow up as outpatient.  Comfort care only.  Tobacco abuse: Tobacco cessation. Comfort care only.  Diarrhea: Likely due to TPN. Per RN patient with voluminous loose stools noted in rectal pouch a 6- 7 days ago which has since improved.  C. difficile PCR negative.  KUB negative.  IV antibiotics discontinued. 4 stools yesterday.  Comfort care only.  Lower extremity pain: Secondary to retroperitoneal hematoma.  Clinical improvement with transfusion of packed red blood cells and FFP in addition to pain management.  Continue Lidoderm patch, IV Robaxin as needed, IV Tylenol.   Comfort care only.  Prognosis: Patient with multiple comorbidities, complicated course during this hospitalization.  Patient admitted with sepsis secondary to peritonitis, duodenal ulcer with perforation status post repair, complicated by non-STEMI, acute CHF.  Patient with ongoing fistula currently n.p.o. on TPN, left upper extremity DVT placed on heparin however complicated by abdominal hematoma and now with large retroperitoneal hematoma leading to hemorrhagic shock requiring pressors and currently off pressors.  Patient transfused total of 6 units packed red blood cells during this hospitalization, as well as 2 units of FFP.  Palliative care following.  Patient with a poor prognosis. Family has now decided to make the patient DNR. The patient himself has expressed his desire to end aggressive interventions and to seek comfort and support for the remainder of his days. His daughter [name] and his wife are in agreement, but his daughter Cardell Peach is wanting further discussions  since she was not present at this meeting with palliative care. Palliative care plans to meet with the family again this afternoon. The plan remains for the patient to go home with hospice on Monday or as soon as arrangements can be made.  Comfort care only.  I have seen and examined this patient myself. I have spent 32 minutes in his evaluation and care.  DVT prophylaxis: SCDs Code Status: Full Family Communication: Updated patient and daughter at bedside. Disposition: The patient is from home. He is admitted as an inpatient. Anticipate discharge to residential hospice. Barriers to discharge include need to set the patient up for hospice at home and to get daughter Cardell Peach on board with the plan.  Status is: Inpatient  Dispo:             Patient From: [facility]             Planned Disposition:  LTAC vs SNF             Expected discharge date: 11/27/19             Medically stable for discharge:  Medically safe for discharge.  Comfort care only. Anticipate discharge to residential hospice.  Marquez Ceesay, DO [facility] Direct contact: see [email or web address]  7PM-7AM contact night coverage as above 11/17/2019, 6:56 PM  LOS: 30 days

## 2019-11-18 LAB — COMPREHENSIVE METABOLIC PANEL
ALT: 78 U/L — ABNORMAL HIGH (ref 0–44)
AST: 63 U/L — ABNORMAL HIGH (ref 15–41)
Albumin: 1.9 g/dL — ABNORMAL LOW (ref 3.5–5.0)
Alkaline Phosphatase: 78 U/L (ref 38–126)
Anion gap: 11 (ref 5–15)
BUN: 29 mg/dL — ABNORMAL HIGH (ref 8–23)
CO2: 23 mmol/L (ref 22–32)
Calcium: 8.2 mg/dL — ABNORMAL LOW (ref 8.9–10.3)
Chloride: 105 mmol/L (ref 98–111)
Creatinine, Ser: 0.74 mg/dL (ref 0.61–1.24)
GFR calc Af Amer: >60 mL/min (ref >60)
GFR calc non Af Amer: >60 mL/min (ref >60)
Glucose, Bld: 123 mg/dL — ABNORMAL HIGH (ref 70–99)
Potassium: 4.1 mmol/L (ref 3.5–5.1)
Sodium: 139 mmol/L (ref 135–145)
Total Bilirubin: 1.5 mg/dL — ABNORMAL HIGH (ref 0.3–1.2)
Total Protein: 6.7 g/dL (ref 6.5–8.1)

## 2019-11-18 LAB — CBC WITH DIFFERENTIAL/PLATELET
Abs Immature Granulocytes: 0.04 10*3/uL (ref 0.00–0.07)
Basophils Absolute: 0 10*3/uL (ref 0.0–0.1)
Basophils Relative: 0 %
Eosinophils Absolute: 0.2 10*3/uL (ref 0.0–0.5)
Eosinophils Relative: 2 %
HCT: 28.8 % — ABNORMAL LOW (ref 39.0–52.0)
Hemoglobin: 8.9 g/dL — ABNORMAL LOW (ref 13.0–17.0)
Immature Granulocytes: 0 %
Lymphocytes Relative: 11 %
Lymphs Abs: 1.1 10*3/uL (ref 0.7–4.0)
MCH: 27.8 pg (ref 26.0–34.0)
MCHC: 30.9 g/dL (ref 30.0–36.0)
MCV: 90 fL (ref 80.0–100.0)
Monocytes Absolute: 0.8 10*3/uL (ref 0.1–1.0)
Monocytes Relative: 8 %
Neutro Abs: 7.7 10*3/uL (ref 1.7–7.7)
Neutrophils Relative %: 79 %
Platelets: 356 10*3/uL (ref 150–400)
RBC: 3.2 MIL/uL — ABNORMAL LOW (ref 4.22–5.81)
RDW: 18.4 % — ABNORMAL HIGH (ref 11.5–15.5)
WBC: 10 10*3/uL (ref 4.0–10.5)
nRBC: 0 % (ref 0.0–0.2)

## 2019-11-18 MED ORDER — METOPROLOL TARTRATE 5 MG/5ML IV SOLN
7.5000 mg | Freq: Three times a day (TID) | INTRAVENOUS | Status: DC
  Administered 2019-11-18 – 2019-11-23 (×11): 7.5 mg via INTRAVENOUS
  Filled 2019-11-18 (×11): qty 10

## 2019-11-18 NOTE — Progress Notes (Signed)
PROGRESS NOTE    Matthew Schwartz  JJH:[id number] DOB: [birth year] DOA: 10/12/2019 PCP: [name], MD   Chief Complaint  Patient presents with  . Abdominal Pain  . Vomiting    Brief Narrative:  [name] NIDDM, HLD, GERD, irritable bowel syndrome (constipation) status post left knee replacement 2009, PTCA 2000-->RCA well as prior prostate CA, former smoker Admit 10/12/2019 severe abdominal pain-ex lap 10/12/2019-perforated duodenal ulcer and remained on vent NSVT postop-echo showed Systolic HF--was on pressors and cardiology consulted-eventually extubated 6/17 + fevers 6/23= postoperative abscess-IR  Placed abd perc drain 6/24--growing Candida /on diflucan On 6/30 upper GI series were performed-and repeated Unfortunately spiked a fever without cause-found to have on CT 7/1 enlarging hematoma General surgery felt this was minimal and not the cause for his fever He has not had high-grade fever since his Antibiotics were adjusted 7/1 vancomycin and Primaxin but continues to have low-grade Heparin was held temporarily until 7/3 and restarted cautiously 7/3 after risk-benefit discussion with family  He has stabilized to some degree on stepdown unit he has had some pain and swelling in his upper extremities likely from the prior DVT in his left upper extremity-his right arm was imaged for DVT and found to be negative He has a long road ahead and TOC is working with patient and family to figure out next steps in terms of placement he may be able to eventually go to an [place]:   Principal Problem:   Severe sepsis with septic shock ([place]) Active Problems:   Hypotension   Hemorrhagic shock (HCC):11/08/2019   Perforated duodenal ulcer ([place])   Peritonitis ([place])   Nontraumatic retroperitoneal hematoma: 11/08/2019   HTN (hypertension)   Malignant neoplasm of prostate ([place])   Hyperlipidemia   Obstipation   HLD (hyperlipidemia)   Diabetes mellitus type 2,  uncontrolled, with complications ([place])   Perforated bowel ([place])   Acute respiratory insufficiency   Elevated troponin level   Acute respiratory failure with hypoxia (HCC)   PVCs (premature ventricular contractions)   Bilateral atelectasis   Acute abdominal pain   Palliative care by specialist   Goals of care, counseling/discussion   General weakness   Epigastric pain   Gastric ulcer   Hypokalemia   NSVT (nonsustained ventricular tachycardia) ([place])   Diarrhea   NSTEMI (non-ST elevated myocardial infarction) ([place])   Acute deep vein thrombosis (DVT) of left upper extremity ([place])   Acute systolic CHF (congestive heart failure) (HCC)   Anemia of chronic disease   Acute respiratory failure with hypoxemia (HCC)   Intraabdominal fluid collection   Abscess  1 septic shock secondary to peritonitis secondary to perforated duodenal ulcer Status post expiratory laparotomy, omental patch repair of duodenal ulcer per Dr. 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  Currently afebrile.  Normal white count.  Patient noted to have upper GI series 10/28/2019 showing persistent small leak with repeat upper GI series done on 11/03/2018 one of the duodenal bulb likely related to prior surgical repair.  No definite persistent leak identified on today's examination.  Patient noted to have fluid collections 10/21/2019 with drain placement with cultures positive for Candida.  Status post drain placement.  On IV fluconazole.  Patient noted to have a T-max of 102.1 on 7/1/ 2021 with a change in antibiotics from IV vancomycin(s/p 5 days) to IV Merrem.  Blood cultures obtained with no growth to date.  Currently afebrile with a normal white count.  Dr. [name] discussed with general surgery, Dr. [name] on 10/30/2019 and was felt that enlarging hematoma was not source of infection as it would drain into the midline wound.  Continue IV PPI.  Patient s/p 8 days of IV Merrem.  IV of Merrem  has been discontinued.  Patient afebrile.  Loose stools have decreased.  C. difficile PCR was negative.  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   Right upper quadrant drain intact.  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  Drain catheter left in place.  Patient was being followed by general surgery and it was noted that patient was being transitioned to comfort/hospice care and as such patient has been allowed to eat and drink for comfort.  General surgery discussed with family and they understood that this will further cause his fistula to not heal but given the desire for transition to comfort it was decided a better way to make patient comfortable.  General surgery has signed off as of 11/16/2019.  Patient on IV fluconazole which we will continue while drain in place as cultures are positive for Candida albicans.  Patient being transitioned to comfort measures however family still not fully decided at this time and is discussing amongst themselves.  Patient tolerating current diet at this time.  NG tube has been discontinued.  Will discontinue IV fluconazole tomorrow 11/19/2019.  Palliative care following.   2.  Hypotension/hemorrhagic shock secondary to large retroperitoneal Patient noted to be hypotensive 1/61/0960 with systolic blood pressures in the 70s requiring transfer to the ICU and pressors of Levophed.  CBC done noted a hemoglobin of 5.9.  CT abdomen and pelvis done consistent with a large retroperitoneal hematoma with evidence of active bleed within the right psoas muscle.  Persistent but decreased size of right rectus muscle hematoma.  Patient was transferred to the ICU and transfused 2 units of FFP, 4 units total of packed red blood cells on 11/08/2019, 11/09/2019.  Levophed subsequently titrated off.  Hemoglobin currently at 8.9 after 4 units of packed red blood cells.  IV heparin has been discontinued and patient no longer a candidate  for anticoagulation.  Clonidine patch discontinued.  Nitroglycerin paste discontinued.  IV Lopressor dose decreased.  Follow H&H.  PCCM consulted who are following however have signed off.  Patient was transitioned to comfort measures with palliative care following however family still deciding as to what they would want.  Monitor for now.    3.  Acute hypoxic respiratory failure Status post extubation 10/15/2019.  NG tube has been discontinued.  Patient with sats of 95% on room air.    4.  Acute systolic heart failure/non-STEMI/coronary artery disease 2000/NSVT Euvolemic.  Blood pressure initially was stable however patient now noted th to be hypotensive on 4/54/0981 with systolic blood pressures in the 70s.  Clonidine patch discontinued.  IV beta-blocker decreased to 5 mg every 4 hours.  Early on in the hospitalization patient noted to have a 5-6 beat run of nonsustained V. tach however asymptomatic on 11/04/2019.  Potassium at 4.3.  Magnesium at 2.2.  Phosphorus at 4.  IV beta-blocker dose had been increased to 10 mg every 4 hours per cardiology however due to hemorrhagic shock IV Lopressor has been decreased.we will increase IV Lopressor to 7.5 mg IV every 8 hours.  Patient was transitioned to comfort measures with palliative care following however family still deciding as to what the will want at this time.  Keep magnesium > 2.  Keep potassium greater > 4.   5.  Left upper extremity DVT cephalic/axillary vein Likely secondary to PICC line.  IV heparin discontinued 11/08/2019 due to hemorrhagic shock secondary to large retroperitoneal hematoma.  Follow.  6.  Right upper extremity swelling and pain and discomfort Heparin initially discontinued on 10/29/2019 after enlarging hematoma.  Heparin was resumed without a bolus on 10/31/2019 after Dr. [name] discussed with a specialist.  Right upper extremity discomfort initially felt to be secondary to a DVT however Dopplers negative.  Patient now with large  retroperitoneal hematoma with hemorrhagic shock on 11/08/2019 and as such heparin discontinued.  Patient no longer a candidate for anticoagulation at this time.  Continue supportive care.  Patient was comfort measures with palliative care following however family still deciding as to what they would want.  Continue supportive care.   7.  Well-controlled diabetes mellitus type 2 Hemoglobin A1c 6.6.  Patient noted not to be a diabetic prior to admission and elevated CBGs likely was secondary to TPN.  Patient was transitioned to comfort measures.  Discontinued.  Sliding scale insulin discontinued.   8.  Anemia of critical illness/acute blood loss anemia Status post transfusion of 2 units packed red blood cells (10/25/2019).  Patient underwent hemorrhagic shock on 11/08/2019 secondary to large retroperitoneal hematoma.  Status post transfusion of 2 units packed red blood cells 11/08/2019 and 2 units of FFP.  Hemoglobin at 6.7 on 11/09/2019 and patient transfused another 2 units of packed red blood cells hemoglobin currently at 8.9. Transfusion threshold hemoglobin  <7.  Palliative care following and patient was transitioned to comfort measures however family fluctuating at this time and still deciding as to what they were want.  9.  Malignancy of the prostate Outpatient follow-up.  Patient currently comfort measures with palliative care following however family still discussing what they would want.  10.  Tobacco abuse Tobacco cessation.  11.  Diarrhea Per RN patient with voluminous loose stools noted in rectal pouch a about a week ago which has since improved.  C. difficile PCR negative.  KUB negative.  Patient has completed course of IV antibiotics.  Patient now comfort measures with palliative care following however seems as if family may be fluctuating on decision at this time.   12.  Lower extremity pain Secondary to retroperitoneal hematoma.  Clinical improvement with transfusion of packed red blood  cells and FFP in addition to pain management.  Continue Lidoderm patch, IV Robaxin as needed.  Supportive care.  Palliative care following.   13.  Prognosis Patient with multiple comorbidities, complicated course during this hospitalization.  Patient admitted with sepsis secondary to peritonitis, duodenal ulcer with perforation status post repair, complicated by non-STEMI, acute CHF.  Patient with ongoing fistula was on TPN. which is subsequently been discontinued, palliative care following and decision was to transition to comfort measures and as such patient has been placed on a diet for comfort, general surgery signed off, palliative care following to determine disposition of residential hospice versus hospice home.  However daughter last night called palliative care and seems to want patient to be transferred to [facility].  Whole family decided on what they want.  Palliative care to discuss with family again today.  Palliative care following and appreciate input and recommendations.     DVT prophylaxis: SCDs Code Status: DNR Family Communication: Updated patient and daughter at bedside. Disposition:   Status is: Inpatient    Dispo:  Patient From:  Home  Planned Disposition:  To be determined.  Family discussing on comfort measures with hospice following versus aggressive care.  Expected discharge date:  To be determined.  Medically stable for discharge:   Patient currently being followed by palliative care and being transitioned to comfort measures.  Discharge plan per palliative care with residential hospice versus home with hospice.  One of the daughters per palliative care had called palliative care MD last night wanting patient transferred to [facility].  Other daughter hearing this for the first time this morning and will touch base with family to decide on patient's disposition.        Consultants:   Palliative care: Dr. Rowe Pavy 10/24/2019  Cardiology: Dr. Margaretann Loveless 10/13/2019  General  surgery: Dr. [name] 10/12/2019  Gastroenterology: Dr. Therisa Doyne 10/12/2019  PCCM: Dr. Halford Chessman 10/12/2019  Procedures:  CT abdomen and pelvis 10/21/2019, 10/29/2019  CT angiogram abdomen and pelvis 10/11/2019  Upper GI series 10/19/2019, 10/28/2019, 11/03/2019  2D echo 10/13/2019, 10/16/2019  Right upper extremity Doppler 11/02/2019  Left upper extremity Dopplers 10/17/2019  CT-guided drain placement right upper quadrant fluid collection per interventional radiology, Dr. Earleen Newport 10/22/2019  Exploratory laparotomy, omental patch repair of duodenal ulcer per Dr. [name] 10/12/2019  Right IJ CVL 10/12/2019>>>> 10/13/2019  Right radial A-line 10/12/2019>>>> 10/15/2019  ETT 10/12/2019>>>> 10/15/2019  Left upper extremity PICC 10/14/2019  Fistulogram 11/06/2019, 10/30/2019  Transfusion 2 units packed red blood cells 10/25/2019  Transfusion of 2 units packed red blood cells 11/08/2019  Transfusion of 2 units FFP 11/08/2019  Transfuse 2 units packed red blood cells 11/09/2019  Significant Hospital Events   6/14 Admit, exploratory laparotomy, omental patch repair of duodenal ulcer 6/16 Off vasopressors, less drainage from abd drain / clearing  6/17 extubated, weak cough, added high flow oxygen 6/18 start heparin gtt 6/20 d/c amiodarone 6/23 new fluid collection on CT abd > Drained by IR 6/24 To [place] 6/25 PCCM sign off 7/01 back to ICU with fever, tachypnea >> PCCM asked to reassess 7/2comfortable this morning 11/08/2019 patient noted to go into hemorrhagic shock got transferred to the ICU placed on a Levophed drip which was subsequently weaned off.  Transfused 2 units packed red blood cells, 2 units FFP and CT abdomen the pelvis which was done concerning for large retroperitoneal hematoma.     Significant Diagnostic Tests:   CT angio abd/pelvis 6/13 >> mild atherosclerosis, no significant arterial stenosis of mesenteric vasculature, no findings of bowel ischemia, colonic diverticulosis w/o  diverticulitis, b/l renal cysts, 12 mm lesion upper pole Rt kidney  ECHO 6/15 >> poor image quality, appears to be regional wall motion variability but confident wall motion analysis is impossible even after contrast administration, LVEF ~ 29-92%, Grade I diastolic dysfunction, RV systolic function is moderately reduced, RV is mildly enlarged  6/19 venous doppler LUE>>acute deep vein thrombosis involving the left axillary vein, surrounding the PICC line. Findings consistent with acute superficial vein thrombosis involving the left cephalic vein.  UGI 6/21 >> small leak from duodenum  CT ABD/Pelvis 6/23 >>interval midline abdominal incision with incomplete skin surgical closure, probable small hematomas within the anterior abdominal wall and the omentum. Nonspecific 4.3 cm air-fluid collection in the RUQ of the abdomen between the duodenal bulb and gallbladder, not containing enteric contrast. No residual leak from the duodenal bulb identified. New small bilateral pleural effusions with dependent L>R lower lobe opacities  Upper GI series 6/30 >>Small puckered stump of a leak at the site of the prior elongatedeak. Findings suggest resolving but persistent extraluminalcollection.  CT abd/pelvis 7/01 >>Anterior midline surgical incision  is again noted. Large irregular high density is noted in this area most consistent with enlarging subcutaneous or intramuscular hematoma. Interval placement of pigtail drainage catheter in the area of fluid collection noted on prior exam in the right upper quadrant. Fluid collection is significantly decompressed compared to prior exam. Micro Data:  SARS CoV2 6/13 >> negative RUQ fluid collection 6/24 >> moderate Candida albicans Blood 6/25 >>negative Blood culture 10/29/2019>>> negative     Antimicrobials:   IV Merrem 10/29/2019>>>>>> 11/05/2019  IV Zosyn 10/12/2019>>>> 10/29/2019  IV vancomycin 10/29/2019>>>> 11/03/2019  IV fluconazole  10/25/2019>>>>   Subjective: Patient sleeping but arousable.  Patient denies any chest pain.  No shortness of breath.  Denies any abdominal pain.  Per daughter patient ate about 25% of his meal today.  Per daughter no nausea vomiting associated with oral intake.  Daughter states oral intake a little bit better today than it was yesterday.  Patient noted to have transition to comfort care.  Palliative care MD patient's daughter had called her wanting patient to be transferred to [facility] ([place]  Objective: Vitals:   11/16/19 1600 11/16/19 1924 11/17/19 0852 11/18/19 0542  BP: (!) 152/67 136/74  (!) 170/97  Pulse: 91 83  98  Resp: (!) 22 20  20   Temp:  98.3 F (36.8 C)  98.3 F (36.8 C)  TempSrc:  Oral  Oral  SpO2: 98% 96%  95%  Weight:   98.9 kg   Height:        Intake/Output Summary (Last 24 hours) at 11/18/2019 1211 Last data filed at 11/18/2019 0615 Gross per 24 hour  Intake 10 ml  Output 1200 ml  Net -1190 ml   Filed Weights   11/15/19 0444 11/16/19 0500 11/17/19 0852  Weight: 95.5 kg 94.5 kg 98.9 kg    Examination:  General exam: Arousable Respiratory system: Lungs clear to auscultation bilaterally anterior lung fields.  No wheezes, no crackles, no rhonchi.  Normal respiratory effort.  Cardiovascular system: Regular rate rhythm no murmurs rubs or gallops.  No JVD.  No lower extremity edema.   Gastrointestinal system: Abdomen is less distended, soft, positive bowel sounds, bandage on midline wound, right upper quadrant drain with some scant drainage noted.  Abdomen nontender to palpation.  No rebound.  No guarding.  Central nervous system: Asleep but arousable.  Moving extremities spontaneously.  No focal neurological deficits noted.  Extremities: Symmetric 5 x 5 power. Skin: No rashes, lesions or ulcers Psychiatry: Judgement and insight appear fair. Mood & affect appropriate.     Data Reviewed: I have personally reviewed following labs and imaging studies  CBC: Recent Labs   Lab 11/12/19 0455 11/15/19 0415 11/16/19 0430  WBC 12.2* 10.7* 10.3  HGB 9.3* 9.3* 9.0*  HCT 29.8* 30.4* 29.2*  MCV 88.7 91.0 91.3  PLT 295 313 433    Basic Metabolic Panel: Recent Labs  Lab 11/12/19 0455 11/12/19 0455 11/12/19 2307 11/13/19 0500 11/14/19 0600 11/15/19 0415 11/16/19 0430  NA 139   < > 140 137 140 142 142  K 3.6   < > 4.1 3.7 4.0 4.0 3.9  CL 104   < > 105 105 105 107 107  CO2 26   < > 27 26 26 26 25   GLUCOSE 108*   < > 151* 139* 148* 140* 124*  BUN 31*   < > 34* 31* 35* 37* 35*  CREATININE 0.60*   < > 0.66 0.59* 0.68 0.61 0.53*  CALCIUM 8.2*   < > 8.1* 8.2*  7.9* 8.2* 8.3*  MG 2.0  --  2.3 2.1 2.2  --   --   PHOS 3.6  --   --  3.2 3.8  --   --    < > = values in this interval not displayed.    GFR: Estimated Creatinine Clearance: 82.3 mL/min (A) (by C-G formula based on SCr of 0.53 mg/dL (L)).  Liver Function Tests: Recent Labs  Lab 11/12/19 0455 11/14/19 0600 11/15/19 0415 11/16/19 0430  AST 67* 32 27 27  ALT 89* 57* 49* 42  ALKPHOS 74 65 66 63  BILITOT 1.0 1.0 0.9 0.9  PROT 6.7 5.9* 6.6 6.4*  ALBUMIN 2.1* 1.7* 1.9* 1.8*    CBG: Recent Labs  Lab 11/14/19 0819 11/14/19 1136 11/14/19 1921 11/14/19 2305 11/15/19 1221  GLUCAP 134* 131* 108* 143* 145*     No results found for this or any previous visit (from the past 240 hour(s)).       Radiology Studies: No results found.      Scheduled Meds: . chlorhexidine  15 mL Mouth Rinse BID  . Chlorhexidine Gluconate Cloth  6 each Topical Daily  . lidocaine  2 patch Transdermal Q24H  . metoprolol tartrate  5 mg Intravenous Q8H  . pantoprazole (PROTONIX) IV  40 mg Intravenous Q24H  . simethicone  160 mg Oral QID  . sodium chloride flush  10-40 mL Intracatheter Q12H  . sodium chloride flush  5 mL Intracatheter Q8H   Continuous Infusions: . sodium chloride 10 mL/hr at 11/07/19 1300  . fluconazole (DIFLUCAN) IV 400 mg (11/17/19 2237)  . HYDROmorphone 0.5 mg/hr (11/16/19 1733)   . methocarbamol (ROBAXIN) IV 500 mg (11/17/19 0119)     LOS: 37 days    Time spent: 40 minutes    Irine Seal, MD [facility]   To contact the attending provider between 7A-7P or the covering provider during after hours 7P-7A, please log into the web site [email or web address] and access using universal [place] password for that web site. If you do not have the password, please call the hospital operator.  11/18/2019, 12:11 PM

## 2019-11-18 NOTE — TOC Progression Note (Signed)
Transition of Care [facility]) - Progression Note    Patient Details  Name: Matthew Schwartz MRN: [id number] Date of Birth: 03-21-1933  Transition of Care [facility]) CM/SW Contact  Sheralyn Pinegar, Marjie Skiff, RN Phone Number: 11/18/2019, 11:04 AM  Clinical Narrative:    This CM was contacted by Dr. Hilma Favors to inform me of the name of the accepting hospitalist at [facility]. [place] [phone] ext [phone]. Dr. Grandville Silos made aware.   Expected Discharge Plan: Long Term Acute Care (LTAC) Barriers to Discharge: Continued Medical Work up  Expected Discharge Plan and Services Expected Discharge Plan: [place] (LTAC)   Discharge Planning Services: CM Consult   Living arrangements for the past 2 months: Single Family Home                            Readmission Risk Interventions Readmission Risk Prevention Plan 11/09/2019  Transportation Screening Complete  Medication Review [employer]) Complete  HRI or [place] Complete  SW Recovery Care/Counseling Consult Complete  [place] Not Applicable  Some recent data might be hidden

## 2019-11-18 NOTE — Progress Notes (Signed)
Nutrition Brief Note  Chart reviewed. Pt now transitioning to comfort care.  No further nutrition interventions warranted at this time.  Please re-consult as needed.   Taylin Leder, MS, RD, LDN RD pager number and weekend/on-call pager number located in Amion.    

## 2019-11-18 NOTE — Care Management Important Message (Signed)
Important Message  Patient Details IM Letter given to Marney Doctor RN Case Manager to present to the Patient Name: Matthew Schwartz MRN: [id number] Date of Birth: 1933/03/14   Medicare Important Message Given:  Yes     [name] 11/18/2019, 9:57 AM

## 2019-11-19 MED ORDER — MAGNESIUM CITRATE PO SOLN: 0.5000 | Freq: Once | ORAL | Status: DC

## 2019-11-19 MED ORDER — BISACODYL 10 MG RE SUPP
10.0000 mg | Freq: Once | RECTAL | Status: AC
  Administered 2019-11-19: 10 mg via RECTAL
  Filled 2019-11-19: qty 1

## 2019-11-19 MED ORDER — HYDROMORPHONE HCL 1 MG/ML IJ SOLN
0.5000 mg | INTRAMUSCULAR | Status: DC | PRN
  Administered 2019-11-20 – 2019-11-21 (×4): 0.5 mg via INTRAVENOUS
  Filled 2019-11-19 (×6): qty 0.5

## 2019-11-19 MED ORDER — BISACODYL 5 MG PO TBEC: 5.0000 mg | DELAYED_RELEASE_TABLET | Freq: Every day | ORAL | Status: DC | PRN

## 2019-11-19 MED ORDER — METHYLNALTREXONE BROMIDE 12 MG/0.6ML ~~LOC~~ SOLN
12.0000 mg | Freq: Once | SUBCUTANEOUS | Status: DC
  Filled 2019-11-19: qty 0.6

## 2019-11-19 NOTE — Progress Notes (Signed)
Palliative Care Progress Note  Patient transitioned to comfort care on 7/19 and moved to [place] from ICU. Since that transition he remains very weak but interactive. He is taking some food and sips of juice by mouth. Given his stabilization and improvement after leaving ICU family called me yesterday to tell me they were reconsidering goals and wanted him to be transferred to [facility] for treatment. They gave me the name of the physician at [facility] , Dr. Winona Legato who was going to coordinate the transfer. There was confusion around reaosn for transfer and what treatment options were actually being offered by [facility]. I spoke with Dr. Winona Legato who is a hospitalist director at one of the [employer]- he could not accept patient on his service but advised family to go through the referral transfer center. I clarified this with family and explained [facility] would likely not take patient given his advanced disease, debility and that he would be too high risk for surgery and there is not repairable anatomy.  Family would like to consider their options again.   Family state that their main worry is location of his care- they feel he is too complex to care for him at home 24/7.  I discussed [facility] w/ them- will help set up that contact so his daughter can go [place]- this may help with anxiety over transfer.  Maintain Hydromorphone infusion and bolus dosing for his comfort.  Lane Hacker, DO Palliative Medicine

## 2019-11-19 NOTE — Progress Notes (Addendum)
Palliative Care Progress Note  Mr. Ambrosini continues to maintain stability and even improvement since his transition out of ICU, he remains very fragile however but has not had a rapid decline towards EOL as expected when he was transitioned to comfort care on 7/19. He was remarkably alert and engaging today- he has successfully been taking in small amounts of food and increasingly able to meet his fluid requirements with liquids. He was well enough to reach over to his bedside table and drink independently from a cup of ginger ale sitting in front of him. He complains of no pain- he even made a joke today when I told him that he had a "good cough" and he responded "as opposed to a bad cough" and laughed. He seems very uplifted and happy by the positive energy his family is bringing into the room around him after several tense days of stressful decision making.  I have encouraged the patient and family to take this one day at a time and reassured them that he was not being deprived of any medical treatments or interventions that would help him.   Main issues today:  1. He is severely constipated  -no BM in several days- worse due to opioid infusion. Will give RelistorX1, DulcolaxX1 and have him sip prune juice and mag citrate as tolerated. Will monitor this closely.  2. Comfort feeding and advance diet as tolerated.  3. His pain seems much improved- he is asking to get OOB. He tells me he wants to move. Will ask PT to assess him from a functional status standpoint and try gentle mobility.  4. Wean down opioid infusion- he may not need continuous rate and we could consider transitioning to a transdermal.  5. I will continue work with this family on goals of care and progression issues.  6. [facility] has reviewed his case and does not have any additional interventions to offer or add to the care he has already received at [facility].  Lane Hacker, DO Palliative Medicine [phone]

## 2019-11-19 NOTE — Progress Notes (Signed)
PROGRESS NOTE    Matthew Schwartz  WNI:[id number] DOB: June 30, 1932 DOA: 10/12/2019 PCP: [name], MD   Chief Complaint  Patient presents with  . Abdominal Pain  . Vomiting    Brief Narrative:  [name] NIDDM, HLD, GERD, irritable bowel syndrome (constipation) status post left knee replacement 2009, PTCA 2000-->RCA well as prior prostate CA, former smoker Admit 10/12/2019 severe abdominal pain-ex lap 10/12/2019-perforated duodenal ulcer and remained on vent NSVT postop-echo showed Systolic HF--was on pressors and cardiology consulted-eventually extubated 6/17 + fevers 6/23= postoperative abscess-IR  Placed abd perc drain 6/24--growing Candida /on diflucan On 6/30 upper GI series were performed-and repeated Unfortunately spiked a fever without cause-found to have on CT 7/1 enlarging hematoma General surgery felt this was minimal and not the cause for his fever He has not had high-grade fever since his Antibiotics were adjusted 7/1 vancomycin and Primaxin but continues to have low-grade Heparin was held temporarily until 7/3 and restarted cautiously 7/3 after risk-benefit discussion with family  He has stabilized to some degree on stepdown unit he has had some pain and swelling in his upper extremities likely from the prior DVT in his left upper extremity-his right arm was imaged for DVT and found to be negative He has a long road ahead and TOC is working with patient and family to figure out next steps in terms of placement he may be able to eventually go to an [place]:   Principal Problem:   Severe sepsis with septic shock ([place]) Active Problems:   Hypotension   Hemorrhagic shock (HCC):11/08/2019   Perforated duodenal ulcer ([place])   Peritonitis ([place])   Nontraumatic retroperitoneal hematoma: 11/08/2019   HTN (hypertension)   Malignant neoplasm of prostate ([place])   Hyperlipidemia   Obstipation   HLD (hyperlipidemia)   Diabetes mellitus type 2,  uncontrolled, with complications ([place])   Perforated bowel ([place])   Acute respiratory insufficiency   Elevated troponin level   Acute respiratory failure with hypoxia (HCC)   PVCs (premature ventricular contractions)   Bilateral atelectasis   Acute abdominal pain   Palliative care by specialist   Goals of care, counseling/discussion   General weakness   Epigastric pain   Gastric ulcer   Hypokalemia   NSVT (nonsustained ventricular tachycardia) ([place])   Diarrhea   NSTEMI (non-ST elevated myocardial infarction) ([place])   Acute deep vein thrombosis (DVT) of left upper extremity ([place])   Acute systolic CHF (congestive heart failure) (HCC)   Anemia of chronic disease   Acute respiratory failure with hypoxemia (HCC)   Abdominal fluid collection   Abscess  1 septic shock secondary to peritonitis secondary to perforated duodenal ulcer Status post expiratory laparotomy, omental patch repair of duodenal ulcer per Dr. 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  Currently afebrile.  Normal white count.  Patient noted to have upper GI series 10/28/2019 showing persistent small leak with repeat upper GI series done on 11/03/2018 one of the duodenal bulb likely related to prior surgical repair.  No definite persistent leak identified on today's examination.  Patient noted to have fluid collections 10/21/2019 with drain placement with cultures positive for Candida.  Status post drain placement.  On IV fluconazole.  Patient noted to have a T-max of 102.1 on 7/1/ 2021 with a change in antibiotics from IV vancomycin(s/p 5 days) to IV Merrem.  Blood cultures obtained with no growth to date.  Currently afebrile with a normal white count.  Dr. [name] discussed with general surgery, Dr. [name] on 10/30/2019 and was felt that enlarging hematoma was not source of infection as it would drain into the midline wound.  Continue IV PPI.  Patient s/p 8 days of IV Merrem. Loose stools have  decreased.  C. difficile PCR was negative.  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   Right upper quadrant drain intact.  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  Drain catheter left in place.  Patient was being followed by general surgery and it was noted that patient was being transitioned to comfort/hospice care and as such patient has been allowed to eat and drink for comfort.  General surgery discussed with family and they understood that this will further cause his fistula to not heal but given the desire for transition to comfort it was decided a better way to make patient comfortable.  General surgery has signed off as of 11/16/2019.  Patient on IV fluconazole which will discontinue. Patient being transitioned to comfort measures however family still not fully decided at this time and is discussing amongst themselves.  Patient tolerating current diet at this time.  NG tube has been discontinued. Palliative care following.   2.  Hypotension/hemorrhagic shock secondary to large retroperitoneal Patient noted to be hypotensive 4/81/8563 with systolic blood pressures in the 70s requiring transfer to the ICU and pressors of Levophed.  CBC done noted a hemoglobin of 5.9.  CT abdomen and pelvis done consistent with a large retroperitoneal hematoma with evidence of active bleed within the right psoas muscle.  Persistent but decreased size of right rectus muscle hematoma.  Patient was transferred to the ICU and transfused 2 units of FFP, 4 units total of packed red blood cells on 11/08/2019, 11/09/2019.  Levophed subsequently titrated off.  Hemoglobin currently at 8.9 after 4 units of packed red blood cells.  IV heparin has been discontinued and patient no longer a candidate for anticoagulation.  Clonidine patch discontinued.  Nitroglycerin paste discontinued.  IV Lopressor dose decreased.  PCCM consulted who were following however have signed off.   Patient was transitioned to comfort measures with palliative care following however family still deciding on options.  Continue current comfort measures for now pending family decision.  Palliative care following.  3.  Acute hypoxic respiratory failure Status post extubation 10/15/2019.  NG tube has been discontinued.  Patient with sats of 96% on room air.    4.  Acute systolic heart failure/non-STEMI/coronary artery disease 2000/NSVT Euvolemic.  Blood pressure initially was stable however patient noted to be hypotensive on 1/49/7026 with systolic blood pressures in the 70s.  Clonidine patch discontinued.  IV beta-blocker decreased to 5 mg every 4 hours.  Early on in the hospitalization patient noted to have a 5-6 beat run of nonsustained V. tach however asymptomatic on 11/04/2019.  Potassium at 4.1.  Magnesium at 2.2.  Phosphorus at 4.  IV beta-blocker dose had been increased to 10 mg every 4 hours per cardiology however due to hemorrhagic shock IV Lopressor has been decreased.continue IV Lopressor 7.5 mg every 8 hours for now.  Patient was transitioned to comfort measures with palliative care following however family still deciding on options.  Continue comfort measures for now. Keep magnesium > 2.  Keep potassium greater > 4.   5.  Left upper extremity DVT cephalic/axillary vein Likely secondary to PICC line.  IV heparin discontinued 11/08/2019 due to hemorrhagic shock secondary to large retroperitoneal hematoma.  Follow.  6.  Right upper extremity swelling and pain and discomfort Heparin initially discontinued on 10/29/2019 after enlarging hematoma.  Heparin was resumed without a bolus on 10/31/2019 after Dr. [name] discussed with a specialist.  Right upper extremity discomfort initially felt to be secondary to a DVT however Dopplers negative.  Patient now with large retroperitoneal hematoma with hemorrhagic shock on 11/08/2019 and as such heparin discontinued.  Patient NOT a candidate for anticoagulation at  this time.  Continue supportive care.  Patient was being transitioned to comfort measures with palliative care following however family still deciding on options.  Continue supportive care at this time.    7.  Well-controlled diabetes mellitus type 2 Hemoglobin A1c 6.6.  Patient noted not to be a diabetic prior to admission and elevated CBGs likely was secondary to TPN.  Patient was transitioned to comfort measures..Sliding scale insulin discontinued.  Follow.   8.  Anemia of critical illness/acute blood loss anemia Status post transfusion of 2 units packed red blood cells (10/25/2019).  Patient s/p hemorrhagic shock on 11/08/2019 secondary to large retroperitoneal hematoma.  Status post transfusion of 2 units packed red blood cells 11/08/2019 and 2 units of FFP.  Hemoglobin was at 6.7 on 11/09/2019 and patient transfused another 2 units of packed red blood cells hemoglobin currently at 8.9. Transfusion threshold hemoglobin  <7.  Palliative care following and patient was transitioned to comfort measures however family still trying to explore all options.  Follow.   9.  Malignancy of the prostate Outpatient follow-up.  Patient currently comfort measures with palliative care following however family still discussing what they would want.  10.  Tobacco abuse Tobacco cessation.  11.  Diarrhea Per RN patient with voluminous loose stools noted in rectal pouch a about a week ago which has since improved.  C. difficile PCR negative.  KUB negative.  Patient status post full course of IV antibiotics.  Patient being followed by palliative care and currently focuses on comfort measures.  Family wanting to explore options.  Follow.  12.  Lower extremity pain Secondary to retroperitoneal hematoma.  Improved clinically after transfusion of packed red blood cells, FFP as well as pain management.   Continue Lidoderm patch, IV Robaxin as needed.  Supportive care.  Palliative care following.   13.  Prognosis Patient  with multiple comorbidities, complicated course during this hospitalization.  Patient admitted with sepsis secondary to peritonitis, duodenal ulcer with perforation status post repair, complicated by non-STEMI, acute CHF.  Patient with ongoing fistula was on TPN. which is subsequently been discontinued, palliative care following and decision was to transition to comfort measures and as such patient has been placed on a diet for comfort, general surgery signed off, palliative care following to determine disposition of residential hospice versus hospice home.  However daughter called palliative care and seems to want patient to be transferred to [facility].  Palliative care following and per palliative care note family would like to consider the options again.  Appreciate palliative care input.     DVT prophylaxis: SCDs Code Status: DNR Family Communication: Updated patient and brother-in-law at bedside with patient's permission. Disposition:   Status is: Inpatient    Dispo:  Patient From:  Home  Planned Disposition:  To be determined.  Family discussing on comfort measures with hospice following.  Expected discharge date:  To be determined.  Medically stable for discharge:   Patient currently being followed by palliative care and being transitioned to comfort measures.  Discharge plan per palliative care with  residential hospice versus home with hospice.         Consultants:   Palliative care: Dr. Rowe Pavy 10/24/2019  Cardiology: Dr. Margaretann Loveless 10/13/2019  General surgery: Dr. [name] 10/12/2019  Gastroenterology: Dr. Therisa Doyne 10/12/2019  PCCM: Dr. Halford Chessman 10/12/2019  Procedures:  CT abdomen and pelvis 10/21/2019, 10/29/2019  CT angiogram abdomen and pelvis 10/11/2019  Upper GI series 10/19/2019, 10/28/2019, 11/03/2019  2D echo 10/13/2019, 10/16/2019  Right upper extremity Doppler 11/02/2019  Left upper extremity Dopplers 10/17/2019  CT-guided drain placement right upper quadrant fluid collection per  interventional radiology, Dr. Earleen Newport 10/22/2019  Exploratory laparotomy, omental patch repair of duodenal ulcer per Dr. [name] 10/12/2019  Right IJ CVL 10/12/2019>>>> 10/13/2019  Right radial A-line 10/12/2019>>>> 10/15/2019  ETT 10/12/2019>>>> 10/15/2019  Left upper extremity PICC 10/14/2019  Fistulogram 11/06/2019, 10/30/2019  Transfusion 2 units packed red blood cells 10/25/2019  Transfusion of 2 units packed red blood cells 11/08/2019  Transfusion of 2 units FFP 11/08/2019  Transfuse 2 units packed red blood cells 11/09/2019  Significant Hospital Events   6/14 Admit, exploratory laparotomy, omental patch repair of duodenal ulcer 6/16 Off vasopressors, less drainage from abd drain / clearing  6/17 extubated, weak cough, added high flow oxygen 6/18 start heparin gtt 6/20 d/c amiodarone 6/23 new fluid collection on CT abd > Drained by IR 6/24 To [facility] 6/25 PCCM sign off 7/01 back to ICU with fever, tachypnea >> PCCM asked to reassess 7/2comfortable this morning 11/08/2019 patient noted to go into hemorrhagic shock got transferred to the ICU placed on a Levophed drip which was subsequently weaned off.  Transfused 2 units packed red blood cells, 2 units FFP and CT abdomen the pelvis which was done concerning for large retroperitoneal hematoma.     Significant Diagnostic Tests:   CT angio abd/pelvis 6/13 >> mild atherosclerosis, no significant arterial stenosis of mesenteric vasculature, no findings of bowel ischemia, colonic diverticulosis w/o diverticulitis, b/l renal cysts, 12 mm lesion upper pole Rt kidney  ECHO 6/15 >> poor image quality, appears to be regional wall motion variability but confident wall motion analysis is impossible even after contrast administration, LVEF ~ 16-10%, Grade I diastolic dysfunction, RV systolic function is moderately reduced, RV is mildly enlarged  6/19 venous doppler LUE>>acute deep vein thrombosis involving the left axillary vein, surrounding the PICC  line. Findings consistent with acute superficial vein thrombosis involving the left cephalic vein.  UGI 6/21 >> small leak from duodenum  CT ABD/Pelvis 6/23 >>interval midline abdominal incision with incomplete skin surgical closure, probable small hematomas within the anterior abdominal wall and the omentum. Nonspecific 4.3 cm air-fluid collection in the RUQ of the abdomen between the duodenal bulb and gallbladder, not containing enteric contrast. No residual leak from the duodenal bulb identified. New small bilateral pleural effusions with dependent L>R lower lobe opacities  Upper GI series 6/30 >>Small puckered stump of a leak at the site of the prior elongatedeak. Findings suggest resolving but persistent extraluminalcollection.  CT abd/pelvis 7/01 >>Anterior midline surgical incision is again noted. Large irregular high density is noted in this area most consistent with enlarging subcutaneous or intramuscular hematoma. Interval placement of pigtail drainage catheter in the area of fluid collection noted on prior exam in the right upper quadrant. Fluid collection is significantly decompressed compared to prior exam. Micro Data:  SARS CoV2 6/13 >> negative RUQ fluid collection 6/24 >> moderate Candida albicans Blood 6/25 >>negative Blood culture 10/29/2019>>> negative     Antimicrobials:   IV Merrem 10/29/2019>>>>>> 11/05/2019  IV Zosyn  10/12/2019>>>> 10/29/2019  IV vancomycin 10/29/2019>>>> 11/03/2019  IV fluconazole 10/25/2019>>>> 11/19/2019   Subjective: Patient laying in bed.  Brother-in-law at bedside.  Patient denies any chest pain or shortness of breath.  No abdominal pain.  Patient tolerating current diet.  Positive flatus.  No bowel movement today.   Objective: Vitals:   11/17/19 0852 11/18/19 0542 11/18/19 2203 11/19/19 0605  BP:  (!) 170/97 129/83 (!) 144/82  Pulse:  98 94 92  Resp:  20 (!) 22   Temp:  98.3 F (36.8 C) 98.1 F (36.7 C) 98.8 F (37.1 C)  TempSrc:  Oral  Oral Oral  SpO2:  95% 95% 96%  Weight: 98.9 kg     Height:        Intake/Output Summary (Last 24 hours) at 11/19/2019 1218 Last data filed at 11/19/2019 1112 Gross per 24 hour  Intake --  Output 1350 ml  Net -1350 ml   Filed Weights   11/15/19 0444 11/16/19 0500 11/17/19 0852  Weight: 95.5 kg 94.5 kg 98.9 kg    Examination:  General exam: Arousable Respiratory system: CTAB anterior lung fields.  No wheezes, no crackles, no rhonchi.  Normal respiratory effort.   Cardiovascular system: RRR no murmurs rubs or gallops.  No JVD.  No lower extremity edema.  Gastrointestinal system: Abdomen is distended, soft, positive bowel sounds, bandage on midline wound with some serosanguineous drainage noted.  Right upper quadrant drain with some scant drainage.  No rebound.  No guarding.  Central nervous system: Alert.  Moving extremities spontaneously.  No focal neurological deficits noted.  Extremities: Symmetric 5 x 5 power. Skin: No rashes, lesions or ulcers Psychiatry: Judgement and insight appear fair. Mood & affect appropriate.     Data Reviewed: I have personally reviewed following labs and imaging studies  CBC: Recent Labs  Lab 11/15/19 0415 11/16/19 0430 11/18/19 1245  WBC 10.7* 10.3 10.0  NEUTROABS  --   --  7.7  HGB 9.3* 9.0* 8.9*  HCT 30.4* 29.2* 28.8*  MCV 91.0 91.3 90.0  PLT 313 328 016    Basic Metabolic Panel: Recent Labs  Lab 11/12/19 2307 11/12/19 2307 11/13/19 0500 11/14/19 0600 11/15/19 0415 11/16/19 0430 11/18/19 1245  NA 140   < > 137 140 142 142 139  K 4.1   < > 3.7 4.0 4.0 3.9 4.1  CL 105   < > 105 105 107 107 105  CO2 27   < > 26 26 26 25 23   GLUCOSE 151*   < > 139* 148* 140* 124* 123*  BUN 34*   < > 31* 35* 37* 35* 29*  CREATININE 0.66   < > 0.59* 0.68 0.61 0.53* 0.74  CALCIUM 8.1*   < > 8.2* 7.9* 8.2* 8.3* 8.2*  MG 2.3  --  2.1 2.2  --   --   --   PHOS  --   --  3.2 3.8  --   --   --    < > = values in this interval not displayed.     GFR: Estimated Creatinine Clearance: 82.3 mL/min (by C-G formula based on SCr of 0.74 mg/dL).  Liver Function Tests: Recent Labs  Lab 11/14/19 0600 11/15/19 0415 11/16/19 0430 11/18/19 1245  AST 32 27 27 63*  ALT 57* 49* 42 78*  ALKPHOS 65 66 63 78  BILITOT 1.0 0.9 0.9 1.5*  PROT 5.9* 6.6 6.4* 6.7  ALBUMIN 1.7* 1.9* 1.8* 1.9*    CBG: Recent Labs  Lab  11/14/19 0819 11/14/19 1136 11/14/19 1921 11/14/19 2305 11/15/19 1221  GLUCAP 134* 131* 108* 143* 145*     No results found for this or any previous visit (from the past 240 hour(s)).       Radiology Studies: No results found.      Scheduled Meds: . chlorhexidine  15 mL Mouth Rinse BID  . Chlorhexidine Gluconate Cloth  6 each Topical Daily  . lidocaine  2 patch Transdermal Q24H  . metoprolol tartrate  7.5 mg Intravenous Q8H  . pantoprazole (PROTONIX) IV  40 mg Intravenous Q24H  . simethicone  160 mg Oral QID  . sodium chloride flush  10-40 mL Intracatheter Q12H  . sodium chloride flush  5 mL Intracatheter Q8H   Continuous Infusions: . sodium chloride 10 mL/hr at 11/07/19 1300  . HYDROmorphone 0.5 mg/hr (11/18/19 1309)  . methocarbamol (ROBAXIN) IV 500 mg (11/17/19 0119)     LOS: 38 days    Time spent: 40 minutes    Irine Seal, MD [facility]   To contact the attending provider between 7A-7P or the covering provider during after hours 7P-7A, please log into the web site [email or web address] and access using universal [place] password for that web site. If you do not have the password, please call the hospital operator.  11/19/2019, 12:18 PM

## 2019-11-20 MED ORDER — SENNOSIDES-DOCUSATE SODIUM 8.6-50 MG PO TABS
1.0000 | ORAL_TABLET | Freq: Every day | ORAL | Status: DC
  Administered 2019-11-20 – 2019-11-30 (×9): 1 via ORAL
  Filled 2019-11-20 (×9): qty 1

## 2019-11-20 NOTE — Progress Notes (Signed)
PROGRESS NOTE    Matthew Schwartz  ZJI:[id number] DOB: 1932-11-06 DOA: 10/12/2019 PCP: [name], MD   Chief Complaint  Patient presents with  . Abdominal Pain  . Vomiting    Brief Narrative:  [name] NIDDM, HLD, GERD, irritable bowel syndrome (constipation) status post left knee replacement 2009, PTCA 2000-->RCA well as prior prostate CA, former smoker Admit 10/12/2019 severe abdominal pain-ex lap 10/12/2019-perforated duodenal ulcer and remained on vent NSVT postop-echo showed Systolic HF--was on pressors and cardiology consulted-eventually extubated 6/17 + fevers 6/23= postoperative abscess-IR  Placed abd perc drain 6/24--growing Candida /on diflucan On 6/30 upper GI series were performed-and repeated Unfortunately spiked a fever without cause-found to have on CT 7/1 enlarging hematoma General surgery felt this was minimal and not the cause for his fever He has not had high-grade fever since his Antibiotics were adjusted 7/1 vancomycin and Primaxin but continues to have low-grade Heparin was held temporarily until 7/3 and restarted cautiously 7/3 after risk-benefit discussion with family  He has stabilized to some degree on stepdown unit he has had some pain and swelling in his upper extremities likely from the prior DVT in his left upper extremity-his right arm was imaged for DVT and found to be negative He has a long road ahead and TOC is working with patient and family to figure out next steps in terms of placement he may be able to eventually go to an [place]:   Principal Problem:   Severe sepsis with septic shock ([place]) Active Problems:   Hypotension   Hemorrhagic shock (HCC):11/08/2019   Perforated duodenal ulcer ([place])   Peritonitis ([place])   Nontraumatic retroperitoneal hematoma: 11/08/2019   HTN (hypertension)   Malignant neoplasm of prostate ([place])   Hyperlipidemia   Obstipation   HLD (hyperlipidemia)   Diabetes mellitus type 2,  uncontrolled, with complications ([place])   Perforated bowel ([place])   Acute respiratory insufficiency   Elevated troponin level   Acute respiratory failure with hypoxia (HCC)   PVCs (premature ventricular contractions)   Bilateral atelectasis   Acute abdominal pain   Palliative care by specialist   Goals of care, counseling/discussion   General weakness   Epigastric pain   Gastric ulcer   Hypokalemia   NSVT (nonsustained ventricular tachycardia) ([place])   Diarrhea   NSTEMI (non-ST elevated myocardial infarction) ([place])   Acute deep vein thrombosis (DVT) of left upper extremity ([place])   Acute systolic CHF (congestive heart failure) (HCC)   Anemia of chronic disease   Acute respiratory failure with hypoxemia (HCC)   Abdominal fluid collection   Abscess  1 septic shock secondary to peritonitis secondary to perforated duodenal ulcer Status post expiratory laparotomy, omental patch repair of duodenal ulcer per Dr. 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  Currently afebrile.  Normal white count.  Patient noted to have upper GI series 10/28/2019 showing persistent small leak with repeat upper GI series done on 11/03/2018 one of the duodenal bulb likely related to prior surgical repair.  No definite persistent leak identified on today's examination.  Patient noted to have fluid collections 10/21/2019 with drain placement with cultures positive for Candida.  Status post drain placement.  On IV fluconazole.  Patient noted to have a T-max of 102.1 on 7/1/ 2021 with a change in antibiotics from IV vancomycin(s/p 5 days) to IV Merrem.  Blood cultures obtained with no growth to date.  Currently afebrile with a normal white count.  Dr. [name] discussed with general surgery, Dr. [name] on 10/30/2019 and was felt that enlarging hematoma was not source of infection as it would drain into the midline wound.  Continue IV PPI.  Patient s/p 8 days of IV Merrem. Loose stools have  decreased.  C. difficile PCR was negative.  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   Right upper quadrant drain intact.  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  Drain catheter left in place.  Patient was being followed by general surgery and it was noted that patient was being transitioned to comfort/hospice care and as such patient has been allowed to eat and drink for comfort.  General surgery discussed with family and they understood that this will further cause his fistula to not heal but given the desire for transition to comfort it was decided a better way to make patient comfortable.  General surgery has signed off as of 11/16/2019.  Patient was on IV fluconazole which has subsequently been discontinued.  Patient being transitioned to comfort measures however family still not fully decided at this time and is discussing amongst themselves.  Patient tolerating current diet at this time.  NG tube has been discontinued. Palliative care following.   2.  Hypotension/hemorrhagic shock secondary to large retroperitoneal Patient noted to be hypotensive 2/87/6811 with systolic blood pressures in the 70s requiring transfer to the ICU and pressors of Levophed.  CBC done noted a hemoglobin of 5.9.  CT abdomen and pelvis done consistent with a large retroperitoneal hematoma with evidence of active bleed within the right psoas muscle.  Persistent but decreased size of right rectus muscle hematoma.  Patient was transferred to the ICU and transfused 2 units of FFP, 4 units total of packed red blood cells on 11/08/2019, 11/09/2019.  Levophed subsequently titrated off.  Hemoglobin currently at 8.9 after 4 units of packed red blood cells.  IV heparin has been discontinued and patient no longer a candidate for anticoagulation.  Clonidine patch discontinued.  Nitroglycerin paste discontinued.  IV Lopressor dose decreased.  PCCM consulted who were following however  have signed off.  Patient was transitioned to comfort measures with palliative care following however family still deciding on options.  Continue current comfort measures for now pending family decision.  Palliative care following.  3.  Acute hypoxic respiratory failure Status post extubation 10/15/2019.  NG tube has been discontinued.  Patient with sats of 94% on room air.    4.  Acute systolic heart failure/non-STEMI/coronary artery disease 2000/NSVT Euvolemic.  Blood pressure initially was stable however patient noted to be hypotensive on 5/72/6203 with systolic blood pressures in the 70s.  Clonidine patch discontinued.  IV beta-blocker decreased to 5 mg every 4 hours.  Early on in the hospitalization patient noted to have a 5-6 beat run of nonsustained V. tach however asymptomatic on 11/04/2019.  Potassium at 4.1.  Magnesium at 2.2.  Phosphorus at 4.  IV beta-blocker dose had been increased to 10 mg every 4 hours per cardiology however due to hemorrhagic shock IV Lopressor has been decreased. Continue IV Lopressor 7.5 mg every 8 hours for now.  Patient was transitioned to comfort measures with palliative care following however family still deciding on options.  Continue comfort measures for now. Keep magnesium > 2.  Keep potassium greater > 4.   5.  Left upper extremity DVT cephalic/axillary vein Likely secondary to PICC line.  IV heparin discontinued 11/08/2019 due to hemorrhagic shock secondary to  large retroperitoneal hematoma.  Follow.  6.  Right upper extremity swelling and pain and discomfort Heparin initially discontinued on 10/29/2019 after enlarging hematoma.  Heparin was resumed without a bolus on 10/31/2019 after Dr. [name] discussed with a specialist.  Right upper extremity discomfort initially felt to be secondary to a DVT however Dopplers negative.  Patient now with large retroperitoneal hematoma with hemorrhagic shock on 11/08/2019 and as such heparin discontinued.  Patient NOT a candidate for  anticoagulation at this time.  Continue supportive care.  Patient was being transitioned to comfort measures with palliative care following however family still deciding on options.  Continue supportive care at this time.    7.  Well-controlled diabetes mellitus type 2 Hemoglobin A1c 6.6.  Patient noted not to be a diabetic prior to admission and elevated CBGs likely was secondary to TPN.  Patient was transitioned to comfort measures..Sliding scale insulin discontinued.  Follow.   8.  Anemia of critical illness/acute blood loss anemia Status post transfusion of 2 units packed red blood cells (10/25/2019).  Patient s/p hemorrhagic shock on 11/08/2019 secondary to large retroperitoneal hematoma.  Status post transfusion of 2 units packed red blood cells 11/08/2019 and 2 units of FFP.  Hemoglobin was at 6.7 on 11/09/2019 and patient transfused another 2 units of packed red blood cells hemoglobin currently at 8.9. Transfusion threshold hemoglobin  <7.  Palliative care following and patient was transitioned to comfort measures however family still trying to explore all options.  Follow.   9.  Malignancy of the prostate Outpatient follow-up.  Patient currently comfort measures with palliative care following however family still discussing what they would want.  10.  Tobacco abuse Tobacco cessation.  11.  Diarrhea Per RN patient with voluminous loose stools noted in rectal pouch a about 1 to 2 weeks ago which has since improved.  C. difficile PCR negative.  KUB negative.  Patient status post full course of IV antibiotics.  Patient being followed by palliative care and currently focuses on comfort measures.  Family wanting to explore options.  Follow.  12.  Constipation Patient given Relistor, Dulcolax suppository x1, some prune juice, and mag citrate as tolerated.  Patient with good bowel movement over the past 24 hours.  Senokot-S nightly.   13.  Lower extremity pain Secondary to retroperitoneal hematoma.   Improved clinically after transfusion of packed red blood cells, FFP as well as pain management.   Continue Lidoderm patch, IV Robaxin as needed.  Supportive care.  Palliative care following.   14.  Prognosis Patient with multiple comorbidities, complicated course during this hospitalization.  Patient admitted with sepsis secondary to peritonitis, duodenal ulcer with perforation status post repair, complicated by non-STEMI, acute CHF.  Patient with ongoing fistula was on TPN. which is subsequently been discontinued, palliative care following and decision was to transition to comfort measures and as such patient has been placed on a diet for comfort, general surgery signed off, palliative care following to determine disposition of residential hospice versus hospice home.  However daughter called palliative care and seems to want patient to be [facility] to [facility].  Palliative care following and per palliative care note family would like to consider the options again.  Family conference with palliative care this morning.  Appreciate palliative care input.     DVT prophylaxis: SCDs Code Status: DNR Family Communication: Updated patient.  Family in conference with palliative care.  Disposition:   Status is: Inpatient    Dispo:  Patient From:  Home  Planned Disposition:  To be determined.  Family discussing on comfort measures with hospice following.  Expected discharge date:  To be determined.  Medically stable for discharge:   Patient currently being followed by palliative care and being transitioned to comfort measures.  Discharge plan per palliative care with residential hospice versus home with hospice.  Palliative care discussions with family about next steps and disposition.        Consultants:   Palliative care: Dr. Rowe Pavy 10/24/2019  Cardiology: Dr. Margaretann Loveless 10/13/2019  General surgery: Dr. [name] 10/12/2019  Gastroenterology: Dr. Therisa Doyne 10/12/2019  PCCM: Dr. Halford Chessman  10/12/2019  Procedures:  CT abdomen and pelvis 10/21/2019, 10/29/2019  CT angiogram abdomen and pelvis 10/11/2019  Upper GI series 10/19/2019, 10/28/2019, 11/03/2019  2D echo 10/13/2019, 10/16/2019  Right upper extremity Doppler 11/02/2019  Left upper extremity Dopplers 10/17/2019  CT-guided drain placement right upper quadrant fluid collection per interventional radiology, Dr. Earleen Newport 10/22/2019  Exploratory laparotomy, omental patch repair of duodenal ulcer per Dr. [name] 10/12/2019  Right IJ CVL 10/12/2019>>>> 10/13/2019  Right radial A-line 10/12/2019>>>> 10/15/2019  ETT 10/12/2019>>>> 10/15/2019  Left upper extremity PICC 10/14/2019  Fistulogram 11/06/2019, 10/30/2019  Transfusion 2 units packed red blood cells 10/25/2019  Transfusion of 2 units packed red blood cells 11/08/2019  Transfusion of 2 units FFP 11/08/2019  Transfuse 2 units packed red blood cells 11/09/2019  Significant Hospital Events   6/14 Admit, exploratory laparotomy, omental patch repair of duodenal ulcer 6/16 Off vasopressors, less drainage from abd drain / clearing  6/17 extubated, weak cough, added high flow oxygen 6/18 start heparin gtt 6/20 d/c amiodarone 6/23 new fluid collection on CT abd > Drained by IR 6/24 To [place] 6/25 PCCM sign off 7/01 back to ICU with fever, tachypnea >> PCCM asked to reassess 7/2comfortable this morning 11/08/2019 patient noted to go into hemorrhagic shock got transferred to the ICU placed on a Levophed drip which was subsequently weaned off.  Transfused 2 units packed red blood cells, 2 units FFP and CT abdomen the pelvis which was done concerning for large retroperitoneal hematoma.     Significant Diagnostic Tests:   CT angio abd/pelvis 6/13 >> mild atherosclerosis, no significant arterial stenosis of mesenteric vasculature, no findings of bowel ischemia, colonic diverticulosis w/o diverticulitis, b/l renal cysts, 12 mm lesion upper pole Rt kidney  ECHO 6/15 >> poor image quality,  appears to be regional wall motion variability but confident wall motion analysis is impossible even after contrast administration, LVEF ~ 82-95%, Grade I diastolic dysfunction, RV systolic function is moderately reduced, RV is mildly enlarged  6/19 venous doppler LUE>>acute deep vein thrombosis involving the left axillary vein, surrounding the PICC line. Findings consistent with acute superficial vein thrombosis involving the left cephalic vein.  UGI 6/21 >> small leak from duodenum  CT ABD/Pelvis 6/23 >>interval midline abdominal incision with incomplete skin surgical closure, probable small hematomas within the anterior abdominal wall and the omentum. Nonspecific 4.3 cm air-fluid collection in the RUQ of the abdomen between the duodenal bulb and gallbladder, not containing enteric contrast. No residual leak from the duodenal bulb identified. New small bilateral pleural effusions with dependent L>R lower lobe opacities  Upper GI series 6/30 >>Small puckered stump of a leak at the site of the prior elongatedeak. Findings suggest resolving but persistent extraluminalcollection.  CT abd/pelvis 7/01 >>Anterior midline surgical incision is again noted. Large irregular high density is noted in this area most consistent with enlarging subcutaneous or intramuscular hematoma. Interval placement of pigtail drainage catheter in  the area of fluid collection noted on prior exam in the right upper quadrant. Fluid collection is significantly decompressed compared to prior exam. Micro Data:  SARS CoV2 6/13 >> negative RUQ fluid collection 6/24 >> moderate Candida albicans Blood 6/25 >>negative Blood culture 10/29/2019>>> negative     Antimicrobials:   IV Merrem 10/29/2019>>>>>> 11/05/2019  IV Zosyn 10/12/2019>>>> 10/29/2019  IV vancomycin 10/29/2019>>>> 11/03/2019  IV fluconazole 10/25/2019>>>> 11/19/2019   Subjective: Patient sleeping but arousable.  Denies chest pain or shortness of breath.  Denies  any abdominal pain.  Noted to have good bowel movement.  Positive flatus.  Abdomen softer and less distended today.  Family in conference with palliative care.   Objective: Vitals:   11/19/19 0605 11/19/19 1500 11/19/19 2248 11/20/19 0515  BP: (!) 144/82 122/71 (!) 140/81 (!) 131/62  Pulse: 92 97 95 88  Resp:   20 20  Temp: 98.8 F (37.1 C)  98.9 F (37.2 C) 98.1 F (36.7 C)  TempSrc: Oral  Oral Oral  SpO2: 96%   96%  Weight:      Height:        Intake/Output Summary (Last 24 hours) at 11/20/2019 1204 Last data filed at 11/20/2019 1144 Gross per 24 hour  Intake 65 ml  Output 985 ml  Net -920 ml   Filed Weights   11/15/19 0444 11/16/19 0500 11/17/19 0852  Weight: 95.5 kg 94.5 kg 98.9 kg    Examination:  General exam: Arousable Respiratory system: Lungs clear to auscultation bilaterally anterior lung fields.  No wheezing, no crackles, no rhonchi.  Normal respiratory effort.   Cardiovascular system: Regular rate and rhythm no murmurs rubs or gallops.  No JVD.  No lower extremity edema.  Gastrointestinal system: Abdomen is less distended, soft, positive bowel sounds, bandage on midline wound, right upper quadrant drain with scant drainage, nontender to palpation.  No rebound.  No guarding.  Central nervous system: Alert.  Moving extremities spontaneously.  No focal neurological deficits noted.  Extremities: Symmetric 5 x 5 power. Skin: No rashes, lesions or ulcers Psychiatry: Judgement and insight appear fair. Mood & affect appropriate.     Data Reviewed: I have personally reviewed following labs and imaging studies  CBC: Recent Labs  Lab 11/15/19 0415 11/16/19 0430 11/18/19 1245  WBC 10.7* 10.3 10.0  NEUTROABS  --   --  7.7  HGB 9.3* 9.0* 8.9*  HCT 30.4* 29.2* 28.8*  MCV 91.0 91.3 90.0  PLT 313 328 034    Basic Metabolic Panel: Recent Labs  Lab 11/14/19 0600 11/15/19 0415 11/16/19 0430 11/18/19 1245  NA 140 142 142 139  K 4.0 4.0 3.9 4.1  CL 105 107 107  105  CO2 26 26 25 23   GLUCOSE 148* 140* 124* 123*  BUN 35* 37* 35* 29*  CREATININE 0.68 0.61 0.53* 0.74  CALCIUM 7.9* 8.2* 8.3* 8.2*  MG 2.2  --   --   --   PHOS 3.8  --   --   --     GFR: Estimated Creatinine Clearance: 82.3 mL/min (by C-G formula based on SCr of 0.74 mg/dL).  Liver Function Tests: Recent Labs  Lab 11/14/19 0600 11/15/19 0415 11/16/19 0430 11/18/19 1245  AST 32 27 27 63*  ALT 57* 49* 42 78*  ALKPHOS 65 66 63 78  BILITOT 1.0 0.9 0.9 1.5*  PROT 5.9* 6.6 6.4* 6.7  ALBUMIN 1.7* 1.9* 1.8* 1.9*    CBG: Recent Labs  Lab 11/14/19 0819 11/14/19 1136 11/14/19 1921 11/14/19  2305 11/15/19 1221  GLUCAP 134* 131* 108* 143* 145*     No results found for this or any previous visit (from the past 240 hour(s)).       Radiology Studies: No results found.      Scheduled Meds: . chlorhexidine  15 mL Mouth Rinse BID  . Chlorhexidine Gluconate Cloth  6 each Topical Daily  . lidocaine  2 patch Transdermal Q24H  . magnesium citrate  0.5 Bottle Oral Once  . methylnaltrexone  12 mg Subcutaneous Once  . metoprolol tartrate  7.5 mg Intravenous Q8H  . pantoprazole (PROTONIX) IV  40 mg Intravenous Q24H  . senna-docusate  1 tablet Oral QHS  . simethicone  160 mg Oral QID  . sodium chloride flush  10-40 mL Intracatheter Q12H  . sodium chloride flush  5 mL Intracatheter Q8H   Continuous Infusions: . sodium chloride 10 mL/hr at 11/07/19 1300  . HYDROmorphone 0.25 mg/hr (11/19/19 2103)  . methocarbamol (ROBAXIN) IV 500 mg (11/17/19 0119)     LOS: 39 days    Time spent: 40 minutes    Irine Seal, MD [facility]   To contact the attending provider between 7A-7P or the covering provider during after hours 7P-7A, please log into the web site [email or web address] and access using universal [place] password for that web site. If you do not have the password, please call the hospital operator.  11/20/2019, 12:04 PM

## 2019-11-20 NOTE — Progress Notes (Signed)
Physical Therapy Treatment Patient Details Name: Matthew Schwartz MRN: [id number] DOB: 1933-04-16 Today's Date: 11/20/2019    History of Present Illness Pt s/p exploratory lap (10/12/19) to repair perforated duodenal ulcer and then on vent with extubation 10/14/19.  Pt with acute respiratory failure with hypoxia and acute systolic CHF in setting of sepsis and peritonitis.  Pt with hx of DM, prostate CA and bil TKR    PT Comments    Returned to see pt with NT for peri-care and bed mobility. Pt participates with rolling, continues to require assist of 2. Total assist to scoot up in supine.  Follow Up Recommendations  SNF;LTACH     Equipment Recommendations  None recommended by PT    Recommendations for Other Services       Precautions / Restrictions Precautions Precautions: Fall Precaution Comments: 1 right drain--abd, may have loose BM Restrictions Weight Bearing Restrictions: No    Mobility  Bed Mobility Overal bed mobility: Needs Assistance Bed Mobility: Rolling Rolling: Max assist;Mod assist;+2 for physical assistance;+2 for safety/equipment         General bed mobility comments: multi-modal cues for techniques and self assist   Transfers                    Ambulation/Gait                 Stairs             Wheelchair Mobility    Modified Rankin (Stroke Patients Only)       Balance                                            Cognition Arousal/Alertness: Awake/alert Behavior During Therapy: WFL for tasks assessed/performed Overall Cognitive Status: Within Functional Limits for tasks assessed                                 General Comments: participates with encouragement of PT and family, delayed response time, family translates intermittently      Exercises   General Comments        Pertinent Vitals/Pain Pain Assessment: Faces Faces Pain Scale: Hurts a little bit Pain Location: legs, R  wrist Pain Descriptors / Indicators: Sore;Grimacing;Guarding;Moaning Pain Intervention(s): Limited activity within patient's tolerance;Monitored during session;Repositioned    Home Living                      Prior Function            PT Goals (current goals can now be found in the care plan section) Acute Rehab PT Goals PT Goal Formulation: With patient/family Time For Goal Achievement: 11/18/19 Potential to Achieve Goals: Fair Progress towards PT goals: Progressing toward goals    Frequency    Min 2X/week      PT Plan Current plan remains appropriate    Co-evaluation              AM-PAC PT "6 Clicks" Mobility   Outcome Measure  Help needed turning from your back to your side while in a flat bed without using bedrails?: Total Help needed moving from lying on your back to sitting on the side of a flat bed without using bedrails?: Total Help needed moving to and from a bed to a chair (including  a wheelchair)?: Total Help needed standing up from a chair using your arms (e.g., wheelchair or bedside chair)?: Total Help needed to walk in hospital room?: Total Help needed climbing 3-5 steps with a railing? : Total 6 Click Score: 6    End of Session   Activity Tolerance: Patient tolerated treatment well;Patient limited by pain Patient left: in bed;with call bell/phone within reach;with family/visitor present   PT Visit Diagnosis: Muscle weakness (generalized) (M62.81);Other abnormalities of gait and mobility (R26.89)     Time: 4932-4199 PT Time Calculation (min) (ACUTE ONLY): 17 min  Charges:  $Therapeutic Exercise: 8-22 mins $Therapeutic Activity: 8-22 mins                     Baxter Flattery, PT  Acute Rehab Dept ([place]) [phone] Pager [phone]  11/20/2019    [facility] 11/20/2019, 1:06 PM

## 2019-11-20 NOTE — Progress Notes (Signed)
Physical Therapy Treatment Patient Details Name: Matthew Schwartz MRN: [id number] DOB: 08-17-1932 Today's Date: 11/20/2019    History of Present Illness Pt s/p exploratory lap (10/12/19) to repair perforated duodenal ulcer and then on vent with extubation 10/14/19.  Pt with acute respiratory failure with hypoxia and acute systolic CHF in setting of sepsis and peritonitis.  Pt with hx of DM, prostate CA and bil TKR    PT Comments    Pt cooperative with encouragement of PT and family.  Performed bed level exercises/ROM all 4 extremities. Pt family would like PT to continue to follow, pt had transitioned to comfort care however now has stabilized.  Will continue to follow in acute setting.   Follow Up Recommendations  SNF;LTACH     Equipment Recommendations  None recommended by PT    Recommendations for Other Services       Precautions / Restrictions Precautions Precautions: Fall Precaution Comments: 1 right drain--abd, may have loose BM Restrictions Weight Bearing Restrictions: No    Mobility  Bed Mobility                  Transfers                    Ambulation/Gait                 Stairs             Wheelchair Mobility    Modified Rankin (Stroke Patients Only)       Balance                                            Cognition Arousal/Alertness: Awake/alert Behavior During Therapy: WFL for tasks assessed/performed Overall Cognitive Status: Within Functional Limits for tasks assessed                                 General Comments: participates with encouragement of PT and family, delayed response time, family translates intermittently      Exercises General Exercises - Upper Extremity Shoulder Flexion: AROM;5 reps;Both;Supine Shoulder ABduction: AROM;AAROM;10 reps;Supine;Both Elbow Flexion: AROM;AAROM;Both;5 reps;Supine Elbow Extension: AROM;AAROM;Both;5 reps General Exercises - Lower  Extremity Ankle Circles/Pumps: AROM;Both;10 reps Heel Slides: AAROM;Both;10 reps;Supine Hip ABduction/ADduction: AAROM;Both;10 reps Straight Leg Raises: AROM;AAROM;Both;5 reps;Supine    General Comments        Pertinent Vitals/Pain Pain Assessment: Faces Faces Pain Scale: Hurts a little bit Pain Location: legs, R wrist Pain Descriptors / Indicators: Sore;Grimacing;Guarding;Moaning Pain Intervention(s): Limited activity within patient's tolerance;Monitored during session;Repositioned    Home Living                      Prior Function            PT Goals (current goals can now be found in the care plan section) Acute Rehab PT Goals PT Goal Formulation: With patient/family Time For Goal Achievement: 11/18/19 Potential to Achieve Goals: Fair Progress towards PT goals: Progressing toward goals    Frequency    Min 2X/week      PT Plan Current plan remains appropriate    Co-evaluation              AM-PAC PT "6 Clicks" Mobility   Outcome Measure  Help needed turning from your back to your side while in a  flat bed without using bedrails?: Total Help needed moving from lying on your back to sitting on the side of a flat bed without using bedrails?: Total Help needed moving to and from a bed to a chair (including a wheelchair)?: Total Help needed standing up from a chair using your arms (e.g., wheelchair or bedside chair)?: Total Help needed to walk in hospital room?: Total Help needed climbing 3-5 steps with a railing? : Total 6 Click Score: 6    End of Session   Activity Tolerance: Patient limited by fatigue Patient left: in bed;with call bell/phone within reach;with family/visitor present   PT Visit Diagnosis: Muscle weakness (generalized) (M62.81);Other abnormalities of gait and mobility (R26.89)     Time: 1050-1109 PT Time Calculation (min) (ACUTE ONLY): 19 min  Charges:  $Therapeutic Exercise: 8-22 mins                     Baxter Flattery,  PT  Acute Rehab Dept ([place]) [phone] Pager [phone]  11/20/2019    [facility] 11/20/2019, 11:16 AM

## 2019-11-21 LAB — BASIC METABOLIC PANEL
Anion gap: 10 (ref 5–15)
BUN: 20 mg/dL (ref 8–23)
CO2: 21 mmol/L — ABNORMAL LOW (ref 22–32)
Calcium: 8.1 mg/dL — ABNORMAL LOW (ref 8.9–10.3)
Chloride: 103 mmol/L (ref 98–111)
Creatinine, Ser: 0.64 mg/dL (ref 0.61–1.24)
GFR calc Af Amer: >60 mL/min (ref >60)
GFR calc non Af Amer: >60 mL/min (ref >60)
Glucose, Bld: 109 mg/dL — ABNORMAL HIGH (ref 70–99)
Potassium: 3.6 mmol/L (ref 3.5–5.1)
Sodium: 134 mmol/L — ABNORMAL LOW (ref 135–145)

## 2019-11-21 LAB — HEMOGLOBIN AND HEMATOCRIT, BLOOD
HCT: 29.5 % — ABNORMAL LOW (ref 39.0–52.0)
Hemoglobin: 9.3 g/dL — ABNORMAL LOW (ref 13.0–17.0)

## 2019-11-21 MED ORDER — BISACODYL 10 MG RE SUPP
10.0000 mg | Freq: Every day | RECTAL | Status: DC
  Administered 2019-11-21 – 2019-12-01 (×6): 10 mg via RECTAL
  Filled 2019-11-21 (×7): qty 1

## 2019-11-21 MED ORDER — ALTEPLASE 2 MG IJ SOLR
2.0000 mg | INTRAMUSCULAR | Status: AC
  Filled 2019-11-21 (×2): qty 2

## 2019-11-21 MED ORDER — HYDROMORPHONE BOLUS VIA INFUSION
0.5000 mg | INTRAVENOUS | Status: DC | PRN
  Administered 2019-11-22 – 2019-11-25 (×4): 0.5 mg via INTRAVENOUS
  Filled 2019-11-21: qty 1

## 2019-11-21 NOTE — Progress Notes (Signed)
In Progress  11/21/19 8:38 PM [place], Vida Roller, RN  IV team consulted for no blood return from TL PICC. Patient seen previously on 11/14/19 by another IV team RN. Patient with history of hemorrhagic bleeding and DVT surrounding LUA PICC. No labs ordered at this time and patient is comfort care. 2 ports flushing fine. Red port has dilaudid gtt infusing. Will await for further orders from provider.

## 2019-11-21 NOTE — Progress Notes (Signed)
New bag of hydromorphone spiked. Wasted 7.5 ml from previous  bag of hydromorphone, witnessed by Juanito Doom RN.

## 2019-11-21 NOTE — Progress Notes (Signed)
Daily Progress Note   Patient Name: Matthew Schwartz       Date: 11/21/2019 DOB: [birth year]  Age: 86 y.o. MRN#: [id number] Attending Physician: Eugenie Filler, MD Primary Care Physician: [name], MD Admit Date: 10/12/2019  Reason for Consultation/Follow-up: Establishing goals of care  Subjective: I saw and examined Matthew Schwartz today and spoke with his brother in law at the bedside.     Patient complains of pain in his RUE/ hand area. Has some mild swelling and warmth? Superficial phlebitis, discussed with RN about warm compress and elevation. Patient also asking about dressing changes, not much fluid in his drain.   Awake alert. Ate only few tsp of his yogurt and oatmeal.    Length of Stay: 40  Current Medications: Scheduled Meds:  . chlorhexidine  15 mL Mouth Rinse BID  . Chlorhexidine Gluconate Cloth  6 each Topical Daily  . lidocaine  2 patch Transdermal Q24H  . magnesium citrate  0.5 Bottle Oral Once  . methylnaltrexone  12 mg Subcutaneous Once  . metoprolol tartrate  7.5 mg Intravenous Q8H  . pantoprazole (PROTONIX) IV  40 mg Intravenous Q24H  . senna-docusate  1 tablet Oral QHS  . simethicone  160 mg Oral QID  . sodium chloride flush  10-40 mL Intracatheter Q12H  . sodium chloride flush  5 mL Intracatheter Q8H    Continuous Infusions: . sodium chloride 250 mL (11/20/19 2109)  . HYDROmorphone 0.25 mg/hr (11/21/19 0718)  . methocarbamol (ROBAXIN) IV 500 mg (11/17/19 0119)    PRN Meds: sodium chloride, acetaminophen, acetaminophen, bisacodyl, glycopyrrolate, HYDROmorphone, ipratropium-albuterol, lip balm, LORazepam, melatonin, methocarbamol (ROBAXIN) IV, Muscle Rub, ondansetron (ZOFRAN) IV, prochlorperazine, silver nitrate applicators, sodium chloride flush  Physical Exam          Elderly male resting in bed Has some dependent edema, appears to be improving.  Regular work of breathing S1-S2 Abdomen  distended  Vital Signs: BP (!) 130/66 (BP Location: Right Arm)   Pulse 90   Temp 98.7 F (37.1 C) (Oral)   Resp 18   Ht 5\' 11"  (1.803 m)   Wt 98.9 kg   SpO2 94%   BMI 30.41 kg/m  SpO2: SpO2: 94 % O2 Device: O2 Device: Room Air O2 Flow Rate: O2 Flow Rate (L/min): 2 L/min  Intake/output summary:   Intake/Output  Summary (Last 24 hours) at 11/21/2019 1230 Last data filed at 11/21/2019 9563 Gross per 24 hour  Intake 64.46 ml  Output 501 ml  Net -436.54 ml   LBM: Last BM Date: 11/20/19 Baseline Weight: Weight: 96.4 kg Most recent weight: Weight: 98.9 kg       Palliative Assessment/Data:    Flowsheet Rows     Most Recent Value  Intake Tab  Referral Department Hospitalist  Unit at Time of Referral ICU  Date Notified 10/23/19  Palliative Care Type New Palliative care  Reason for referral Clarify Goals of Care  Date of Admission 10/12/19  Date first seen by Palliative Care 10/24/19  # of days Palliative referral response time 1 Day(s)  # of days IP prior to Palliative referral 11  Clinical Assessment  Psychosocial & Spiritual Assessment  Palliative Care Outcomes     Palliative performance scale 30% Patient Active Problem List   Diagnosis Date Noted  . Abscess   . Nontraumatic retroperitoneal hematoma: 11/08/2019 11/09/2019  . Hemorrhagic shock (HCC):11/08/2019 11/09/2019  . Acute respiratory failure with hypoxemia ([place])   . Abdominal fluid collection   . Anemia of chronic disease   . Hypotension   . Gastric ulcer   . Hypokalemia   . NSVT (nonsustained ventricular tachycardia) ([place])   . Diarrhea   . Severe sepsis with septic shock ([place])   . NSTEMI (non-ST elevated myocardial infarction) ([place])   . Acute deep vein thrombosis (DVT) of left upper extremity ([place])   . Acute systolic CHF (congestive heart failure) ([place])   . Peritonitis  ([place])   . Epigastric pain   . Palliative care by specialist   . Goals of care, counseling/discussion   . General weakness   . Acute abdominal pain   . Bilateral atelectasis 10/17/2019  . Acute respiratory insufficiency   . Elevated troponin level   . Acute respiratory failure with hypoxia ([place])   . PVCs (premature ventricular contractions)   . Obstipation 10/12/2019  . HLD (hyperlipidemia) 10/12/2019  . Diabetes mellitus type 2, uncontrolled, with complications ([place]) 87/56/4332  . Perforated bowel ([place]) 10/12/2019  . Perforated duodenal ulcer ([place]) 10/12/2019  . Anemia 09/07/2016  . Coronary artery disease due to lipid rich plaque 02/16/2015  . Hyperlipidemia 02/16/2015  . Malignant neoplasm of prostate ([place]) 02/18/2014  . HTN (hypertension) 06/14/2013  . Dyslipidemia 06/14/2013  . GERD (gastroesophageal reflux disease) 06/14/2013    Palliative Care Assessment & Plan   Patient Profile:    Assessment: 86 year old gentleman with coronary artery disease status post percutaneous intervention in the year 2000, hypertension, dyslipidemia, diabetes who was admitted with perforated duodenal ulcer and peritonitis. Cardiology has been following for NSVT, intermittent left bundle branch block and anterolateral T wave inversions as well as elevated troponin, at some point is supposed to undergo cardiac catheterization after acute issues resolved. General surgery also following.  Has NG tube.  To continue TPN.  Plan for another upper GI this week. Frailty deconditioning  Constipation Generalized pain  Recommendations/Plan:  DNR  Overall with focus on symptom management to continue  Continue Dilaudid drip.   Ongoing discussions about appropriate disposition options.       Code Status:    Code Status Orders  (From admission, onward)         Start     Ordered   10/12/19 1208  Full code  Continuous        10/12/19 1212        Code Status History  Date Active Date  Inactive Code Status Order ID Comments User Context   02/12/2014 1127 02/13/2014 0336 Full Code [id number]  Arne Cleveland, MD [place] Planning Activity    Advance Directive Documentation     Most Recent Value  Type of Advance Directive Healthcare Power of Attorney, Living will  Pre-existing out of facility DNR order (yellow form or pink MOST form) --  "MOST" Form in Place? --       Prognosis:   Unable to determine Guarded   Discharge Planning:  To be determined, ?home with hospice.     Care plan was discussed with patient and  Brother in law at bedside   Thank you for allowing the Palliative Medicine Team to assist in the care of this patient.   Total Time 25 Prolonged Time Billed  no    Greater than 50%  of this time was spent counseling and coordinating care related to the above assessment and plan.  Loistine Chance, MD  Please contact Palliative Medicine Team phone at [phone] for questions and concerns.

## 2019-11-21 NOTE — Progress Notes (Signed)
PROGRESS NOTE    Matthew Schwartz  KDX:[id number] DOB: May 13, 1932 DOA: 10/12/2019 PCP: [name], MD   Chief Complaint  Patient presents with  . Abdominal Pain  . Vomiting    Brief Narrative:  [name] NIDDM, HLD, GERD, irritable bowel syndrome (constipation) status post left knee replacement 2009, PTCA 2000-->RCA well as prior prostate CA, former smoker Admit 10/12/2019 severe abdominal pain-ex lap 10/12/2019-perforated duodenal ulcer and remained on vent NSVT postop-echo showed Systolic HF--was on pressors and cardiology consulted-eventually extubated 6/17 + fevers 6/23= postoperative abscess-IR  Placed abd perc drain 6/24--growing Candida /on diflucan On 6/30 upper GI series were performed-and repeated Unfortunately spiked a fever without cause-found to have on CT 7/1 enlarging hematoma General surgery felt this was minimal and not the cause for his fever He has not had high-grade fever since his Antibiotics were adjusted 7/1 vancomycin and Primaxin but continues to have low-grade Heparin was held temporarily until 7/3 and restarted cautiously 7/3 after risk-benefit discussion with family  He has stabilized to some degree on stepdown unit he has had some pain and swelling in his upper extremities likely from the prior DVT in his left upper extremity-his right arm was imaged for DVT and found to be negative He has a long road ahead and TOC is working with patient and family to figure out next steps in terms of placement he may be able to eventually go to an [place]:   Principal Problem:   Severe sepsis with septic shock ([place]) Active Problems:   Hypotension   Hemorrhagic shock (HCC):11/08/2019   Perforated duodenal ulcer ([place])   Peritonitis ([place])   Nontraumatic retroperitoneal hematoma: 11/08/2019   HTN (hypertension)   Malignant neoplasm of prostate ([place])   Hyperlipidemia   Obstipation   HLD (hyperlipidemia)   Diabetes mellitus type 2,  uncontrolled, with complications ([place])   Perforated bowel ([place])   Acute respiratory insufficiency   Elevated troponin level   Acute respiratory failure with hypoxia (HCC)   PVCs (premature ventricular contractions)   Bilateral atelectasis   Acute abdominal pain   Palliative care by specialist   Goals of care, counseling/discussion   General weakness   Epigastric pain   Gastric ulcer   Hypokalemia   NSVT (nonsustained ventricular tachycardia) ([place])   Diarrhea   NSTEMI (non-ST elevated myocardial infarction) ([place])   Acute deep vein thrombosis (DVT) of left upper extremity ([place])   Acute systolic CHF (congestive heart failure) (HCC)   Anemia of chronic disease   Acute respiratory failure with hypoxemia (HCC)   Abdominal fluid collection   Abscess  1 septic shock secondary to peritonitis secondary to perforated duodenal ulcer Status post expiratory laparotomy, omental patch repair of duodenal ulcer per Dr. 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  Currently afebrile.  Normal white count.  Patient noted to have upper GI series 10/28/2019 showing persistent small leak with repeat upper GI series done on 11/03/2018 one of the duodenal bulb likely related to prior surgical repair.  No definite persistent leak identified on today's examination.  Patient noted to have fluid collections 10/21/2019 with drain placement with cultures positive for Candida.  Status post drain placement.  On IV fluconazole.  Patient noted to have a T-max of 102.1 on 7/1/ 2021 with a change in antibiotics from IV vancomycin(s/p 5 days) to IV Merrem.  Blood cultures obtained with no growth to date.  Currently afebrile with a normal white count.  Dr. [name] discussed with general surgery, Dr. [name] on 10/30/2019 and was felt that enlarging hematoma was not source of infection as it would drain into the midline wound.  Continue IV PPI.  Patient s/p 8 days of IV Merrem. Loose stools have  decreased.  C. difficile PCR was negative.  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   Right upper quadrant drain intact.  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  Drain catheter left in place.  Patient was being followed by general surgery and it was noted that patient was being transitioned to comfort/hospice care and as such patient has been allowed to eat and drink for comfort.  General surgery discussed with family and they understood that this will further cause his fistula to not heal but given the desire for transition to comfort it was decided a better way to make patient comfortable.  General surgery has signed off as of 11/16/2019.  Patient was on IV fluconazole which has subsequently been discontinued.  Patient being transitioned to comfort measures however family still not fully decided at this time and is discussing amongst themselves.  Patient tolerating current diet at this time.  NG tube has been discontinued. Palliative care following.   2.  Hypotension/hemorrhagic shock secondary to large retroperitoneal Patient noted to be hypotensive 09/24/7822 with systolic blood pressures in the 70s requiring transfer to the ICU and pressors of Levophed.  CBC done noted a hemoglobin of 5.9.  CT abdomen and pelvis done consistent with a large retroperitoneal hematoma with evidence of active bleed within the right psoas muscle.  Persistent but decreased size of right rectus muscle hematoma.  Patient was transferred to the ICU and transfused 2 units of FFP, 4 units total of packed red blood cells on 11/08/2019, 11/09/2019.  Levophed subsequently titrated off.  Hemoglobin currently at 8.9 after 4 units of packed red blood cells.  IV heparin has been discontinued and patient no longer a candidate for anticoagulation.  Clonidine patch discontinued.  Nitroglycerin paste discontinued.  IV Lopressor dose decreased.  PCCM consulted who were following however  have signed off.  Patient was transitioned to comfort measures with palliative care following however family still deciding on options.  Continue current comfort measures for now pending family decision.  Palliative care following.  3.  Acute hypoxic respiratory failure Status post extubation 10/15/2019.  NG tube has been discontinued.  Patient with sats of 94% on room air.    4.  Acute systolic heart failure/non-STEMI/coronary artery disease 2000/NSVT Euvolemic.  Blood pressure initially was stable however patient noted to be hypotensive on 2/35/3614 with systolic blood pressures in the 70s.  Clonidine patch discontinued.  IV beta-blocker decreased to 5 mg every 4 hours.  Early on in the hospitalization patient noted to have a 5-6 beat run of nonsustained V. tach however asymptomatic on 11/04/2019.  Potassium at 4.1.  Magnesium at 2.2.  Phosphorus at 4.  IV beta-blocker dose had been increased to 10 mg every 4 hours per cardiology however due to hemorrhagic shock IV Lopressor has been decreased. Continue IV Lopressor 7.5 mg every 8 hours for now.  Patient was transitioned to comfort measures with palliative care following however family still deciding on options.  Continue comfort measures for now. Keep magnesium > 2.  Keep potassium greater > 4.   5.  Left upper extremity DVT cephalic/axillary vein Likely secondary to PICC line.  IV heparin discontinued 11/08/2019 due to hemorrhagic shock secondary to  large retroperitoneal hematoma.  Follow.  6.  Right upper extremity swelling and pain and discomfort Heparin initially discontinued on 10/29/2019 after enlarging hematoma.  Heparin was resumed without a bolus on 10/31/2019 after Dr. [name] discussed with a specialist.  Right upper extremity discomfort initially felt to be secondary to a DVT however Dopplers negative.  Patient now with large retroperitoneal hematoma with hemorrhagic shock on 11/08/2019 and as such heparin discontinued.  Patient NOT a candidate for  anticoagulation at this time.  Continue supportive care.  Patient was being transitioned to comfort measures with palliative care following however family still deciding on options.  Continue supportive care at this time.    7.  Well-controlled diabetes mellitus type 2 Hemoglobin A1c 6.6.  Patient noted not to be a diabetic prior to admission and elevated CBGs likely was secondary to TPN.  Patient was transitioned to comfort measures.  TPN discontinued..Sliding scale insulin discontinued.  Follow.   8.  Anemia of critical illness/acute blood loss anemia Status post transfusion of 2 units packed red blood cells (10/25/2019).  Patient s/p hemorrhagic shock on 11/08/2019 secondary to large retroperitoneal hematoma.  Status post transfusion of 2 units packed red blood cells 11/08/2019 and 2 units of FFP.  Hemoglobin was at 6.7 on 11/09/2019 and patient transfused another 2 units of packed red blood cells hemoglobin currently at 9.3. Transfusion threshold hemoglobin  <7.  Palliative care following and patient was transitioned to comfort measures however family still trying to explore all options.  Follow.   9.  Malignancy of the prostate Outpatient follow-up.  Patient currently comfort measures with palliative care following however family still discussing what they would want.  10.  Tobacco abuse Tobacco cessation.  11.  Diarrhea Per RN patient with voluminous loose stools noted in rectal pouch a about 1 to 2 weeks ago which has since improved.  C. difficile PCR negative.  KUB negative.  Patient status post full course of IV antibiotics.  Patient being followed by palliative care and currently focuses on comfort measures.  Family was wanting to explore options.  Follow.  12.  Constipation Patient given Relistor, Dulcolax suppository x1, some prune juice, and mag citrate as tolerated.  Patient with good bowel movement on 11/19/2019.  Patient with complaints of constipation today.  Place on Dulcolax  suppository daily.  We will asked RN to give magnesium citrate that was ordered on 11/19/2019.  Continue Senokot-S.   13.  Lower extremity pain Secondary to retroperitoneal hematoma.  Improved clinically after transfusion of packed red blood cells, FFP as well as pain management.   Continue Lidoderm patch, IV Robaxin as needed.  Patient on Dilaudid drip.  Supportive care.  Palliative care following.   14.  Prognosis Patient with multiple comorbidities, complicated course during this hospitalization.  Patient admitted with sepsis secondary to peritonitis, duodenal ulcer with perforation status post repair, complicated by non-STEMI, acute CHF.  Patient with ongoing fistula was on TPN. which is subsequently been discontinued, palliative care following and decision was to transition to comfort measures and as such patient has been placed on a diet for comfort, general surgery signed off, palliative care following to determine disposition of residential hospice versus hospice home.  However daughter called palliative care and initially had wanted patient transferred to [facility].  Palliative care following and per palliative care note family would like to consider the options again.  Palliative following and appreciate input and recommendations.     DVT prophylaxis: SCDs Code Status: DNR Family Communication:  Updated patient.  Family in conference with palliative care.  Disposition:   Status is: Inpatient    Dispo:  Patient From:  Home  Planned Disposition:  To be determined.  Family discussing on comfort measures with hospice following.  Expected discharge date:  To be determined.  Medically stable for discharge:   Patient currently being followed by palliative care and being transitioned to comfort measures.  Discharge plan per palliative care with residential hospice versus home with hospice.  Palliative care discussions with family about next steps and disposition.        Consultants:    Palliative care: Dr. Rowe Pavy 10/24/2019  Cardiology: Dr. Margaretann Loveless 10/13/2019  General surgery: Dr. [name] 10/12/2019  Gastroenterology: Dr. Therisa Doyne 10/12/2019  PCCM: Dr. Halford Chessman 10/12/2019  Procedures:  CT abdomen and pelvis 10/21/2019, 10/29/2019  CT angiogram abdomen and pelvis 10/11/2019  Upper GI series 10/19/2019, 10/28/2019, 11/03/2019  2D echo 10/13/2019, 10/16/2019  Right upper extremity Doppler 11/02/2019  Left upper extremity Dopplers 10/17/2019  CT-guided drain placement right upper quadrant fluid collection per interventional radiology, Dr. Earleen Newport 10/22/2019  Exploratory laparotomy, omental patch repair of duodenal ulcer per Dr. [name] 10/12/2019  Right IJ CVL 10/12/2019>>>> 10/13/2019  Right radial A-line 10/12/2019>>>> 10/15/2019  ETT 10/12/2019>>>> 10/15/2019  Left upper extremity PICC 10/14/2019  Fistulogram 11/06/2019, 10/30/2019  Transfusion 2 units packed red blood cells 10/25/2019  Transfusion of 2 units packed red blood cells 11/08/2019  Transfusion of 2 units FFP 11/08/2019  Transfuse 2 units packed red blood cells 11/09/2019  Significant Hospital Events   6/14 Admit, exploratory laparotomy, omental patch repair of duodenal ulcer 6/16 Off vasopressors, less drainage from abd drain / clearing  6/17 extubated, weak cough, added high flow oxygen 6/18 start heparin gtt 6/20 d/c amiodarone 6/23 new fluid collection on CT abd > Drained by IR 6/24 To [place] 6/25 PCCM sign off 7/01 back to ICU with fever, tachypnea >> PCCM asked to reassess 7/2comfortable this morning 11/08/2019 patient noted to go into hemorrhagic shock got transferred to the ICU placed on a Levophed drip which was subsequently weaned off.  Transfused 2 units packed red blood cells, 2 units FFP and CT abdomen the pelvis which was done concerning for large retroperitoneal hematoma.     Significant Diagnostic Tests:   CT angio abd/pelvis 6/13 >> mild atherosclerosis, no significant arterial stenosis of  mesenteric vasculature, no findings of bowel ischemia, colonic diverticulosis w/o diverticulitis, b/l renal cysts, 12 mm lesion upper pole Rt kidney  ECHO 6/15 >> poor image quality, appears to be regional wall motion variability but confident wall motion analysis is impossible even after contrast administration, LVEF ~ 77-41%, Grade I diastolic dysfunction, RV systolic function is moderately reduced, RV is mildly enlarged  6/19 venous doppler LUE>>acute deep vein thrombosis involving the left axillary vein, surrounding the PICC line. Findings consistent with acute superficial vein thrombosis involving the left cephalic vein.  UGI 6/21 >> small leak from duodenum  CT ABD/Pelvis 6/23 >>interval midline abdominal incision with incomplete skin surgical closure, probable small hematomas within the anterior abdominal wall and the omentum. Nonspecific 4.3 cm air-fluid collection in the RUQ of the abdomen between the duodenal bulb and gallbladder, not containing enteric contrast. No residual leak from the duodenal bulb identified. New small bilateral pleural effusions with dependent L>R lower lobe opacities  Upper GI series 6/30 >>Small puckered stump of a leak at the site of the prior elongatedeak. Findings suggest resolving but persistent extraluminalcollection.  CT abd/pelvis 7/01 >>Anterior midline surgical incision is  again noted. Large irregular high density is noted in this area most consistent with enlarging subcutaneous or intramuscular hematoma. Interval placement of pigtail drainage catheter in the area of fluid collection noted on prior exam in the right upper quadrant. Fluid collection is significantly decompressed compared to prior exam. Micro Data:  SARS CoV2 6/13 >> negative RUQ fluid collection 6/24 >> moderate Candida albicans Blood 6/25 >>negative Blood culture 10/29/2019>>> negative     Antimicrobials:   IV Merrem 10/29/2019>>>>>> 11/05/2019  IV Zosyn 10/12/2019>>>>  10/29/2019  IV vancomycin 10/29/2019>>>> 11/03/2019  IV fluconazole 10/25/2019>>>> 11/19/2019   Subjective: Patient sleeping but arousable.  Denies chest pain or shortness of breath.  Tolerating oral intake but poor intake per brother-in-law at bedside.  No abdominal pain.  No nausea or vomiting.  Patient complained of constipation.   Objective: Vitals:   11/19/19 1500 11/19/19 2248 11/20/19 0515 11/20/19 1347  BP: 122/71 (!) 140/81 (!) 131/62 (!) 130/66  Pulse: 97 95 88 90  Resp:  20 20 18   Temp:  98.9 F (37.2 C) 98.1 F (36.7 C) 98.7 F (37.1 C)  TempSrc:  Oral Oral Oral  SpO2:   96% 94%  Weight:      Height:        Intake/Output Summary (Last 24 hours) at 11/21/2019 1353 Last data filed at 11/21/2019 3086 Gross per 24 hour  Intake 64.46 ml  Output 501 ml  Net -436.54 ml   Filed Weights   11/15/19 0444 11/16/19 0500 11/17/19 0852  Weight: 95.5 kg 94.5 kg 98.9 kg    Examination:  General exam: NAD Respiratory system: CTAB.  No wheezes, no crackles, no rhonchi.  Normal respiratory effort.  Cardiovascular system: RRR no murmurs rubs or gallops.  No JVD.  No lower extremity edema.  Gastrointestinal system: Abdomen is mildly distended, soft, positive bowel sounds.  Bandage on midline wound.  Right upper quadrant drain with scant drainage.  Nontender to palpation.  No rebound.  No guarding. Central nervous system: Alert.  Moving extremities spontaneously.  No focal neurological deficits noted.  Extremities: Symmetric 5 x 5 power. Skin: No rashes, lesions or ulcers Psychiatry: Judgement and insight appear fair. Mood & affect appropriate.     Data Reviewed: I have personally reviewed following labs and imaging studies  CBC: Recent Labs  Lab 11/15/19 0415 11/16/19 0430 11/18/19 1245 11/21/19 0610  WBC 10.7* 10.3 10.0  --   NEUTROABS  --   --  7.7  --   HGB 9.3* 9.0* 8.9* 9.3*  HCT 30.4* 29.2* 28.8* 29.5*  MCV 91.0 91.3 90.0  --   PLT 313 328 356  --     Basic  Metabolic Panel: Recent Labs  Lab 11/15/19 0415 11/16/19 0430 11/18/19 1245 11/21/19 0610  NA 142 142 139 134*  K 4.0 3.9 4.1 3.6  CL 107 107 105 103  CO2 26 25 23  21*  GLUCOSE 140* 124* 123* 109*  BUN 37* 35* 29* 20  CREATININE 0.61 0.53* 0.74 0.64  CALCIUM 8.2* 8.3* 8.2* 8.1*    GFR: Estimated Creatinine Clearance: 82.3 mL/min (by C-G formula based on SCr of 0.64 mg/dL).  Liver Function Tests: Recent Labs  Lab 11/15/19 0415 11/16/19 0430 11/18/19 1245  AST 27 27 63*  ALT 49* 42 78*  ALKPHOS 66 63 78  BILITOT 0.9 0.9 1.5*  PROT 6.6 6.4* 6.7  ALBUMIN 1.9* 1.8* 1.9*    CBG: Recent Labs  Lab 11/14/19 1921 11/14/19 2305 11/15/19 1221  GLUCAP 108* 143*  145*     No results found for this or any previous visit (from the past 240 hour(s)).       Radiology Studies: No results found.      Scheduled Meds: . bisacodyl  10 mg Rectal Daily  . chlorhexidine  15 mL Mouth Rinse BID  . Chlorhexidine Gluconate Cloth  6 each Topical Daily  . lidocaine  2 patch Transdermal Q24H  . magnesium citrate  0.5 Bottle Oral Once  . methylnaltrexone  12 mg Subcutaneous Once  . metoprolol tartrate  7.5 mg Intravenous Q8H  . pantoprazole (PROTONIX) IV  40 mg Intravenous Q24H  . senna-docusate  1 tablet Oral QHS  . simethicone  160 mg Oral QID  . sodium chloride flush  10-40 mL Intracatheter Q12H  . sodium chloride flush  5 mL Intracatheter Q8H   Continuous Infusions: . sodium chloride 250 mL (11/20/19 2109)  . HYDROmorphone 0.25 mg/hr (11/21/19 0718)  . methocarbamol (ROBAXIN) IV 500 mg (11/17/19 0119)     LOS: 40 days    Time spent: 40 minutes    Irine Seal, MD [facility]   To contact the attending provider between 7A-7P or the covering provider during after hours 7P-7A, please log into the web site [email or web address] and access using universal [place] password for that web site. If you do not have the password, please call the hospital  operator.  11/21/2019, 1:53 PM

## 2019-11-22 MED ORDER — ALTEPLASE 2 MG IJ SOLR
2.0000 mg | Freq: Once | INTRAMUSCULAR | Status: AC
  Administered 2019-11-22: 2 mg
  Filled 2019-11-22: qty 2

## 2019-11-22 MED ORDER — PRO-STAT SUGAR FREE PO LIQD
30.0000 mL | Freq: Two times a day (BID) | ORAL | Status: DC
  Administered 2019-11-22 – 2019-12-01 (×18): 30 mL via ORAL
  Filled 2019-11-22 (×19): qty 30

## 2019-11-22 NOTE — Progress Notes (Addendum)
Daily Progress Note   Patient Name: Matthew Schwartz       Date: 11/22/2019 DOB: May 09, 1932  Age: 86 y.o. MRN#: [id number] Attending Physician: Matthew Filler, MD Primary Care Physician: Matthew Fess, MD Admit Date: 10/12/2019  Reason for Consultation/Follow-up: Establishing goals of care  Subjective: I saw and examined Matthew Schwartz today and spoke with his son in law at the bedside.     Patient awake alert, drank some sips. Denies pain. Had a bowel movement. In good spirits.      Length of Stay: 41  Current Medications: Scheduled Meds:  . bisacodyl  10 mg Rectal Daily  . chlorhexidine  15 mL Mouth Rinse BID  . Chlorhexidine Gluconate Cloth  6 each Topical Daily  . lidocaine  2 patch Transdermal Q24H  . magnesium citrate  0.5 Bottle Oral Once  . methylnaltrexone  12 mg Subcutaneous Once  . metoprolol tartrate  7.5 mg Intravenous Q8H  . pantoprazole (PROTONIX) IV  40 mg Intravenous Q24H  . senna-docusate  1 tablet Oral QHS  . simethicone  160 mg Oral QID  . sodium chloride flush  10-40 mL Intracatheter Q12H  . sodium chloride flush  5 mL Intracatheter Q8H    Continuous Infusions: . sodium chloride 250 mL (11/22/19 0004)  . HYDROmorphone 0.25 mg/hr (11/22/19 0600)  . methocarbamol (ROBAXIN) IV 500 mg (11/17/19 0119)    PRN Meds: sodium chloride, acetaminophen, acetaminophen, bisacodyl, glycopyrrolate, HYDROmorphone, ipratropium-albuterol, lip balm, LORazepam, melatonin, methocarbamol (ROBAXIN) IV, ondansetron (ZOFRAN) IV, prochlorperazine, silver nitrate applicators, sodium chloride flush  Physical Exam         Elderly male resting in bed Has some dependent edema, appears to be improving.  Regular work of breathing S1-S2 Abdomen  distended  Vital Signs: BP (!) 130/66 (BP  Location: Right Arm)   Pulse 90   Temp 98.7 F (37.1 C) (Oral)   Resp 18   Ht 5\' 11"  (1.803 m)   Wt 98.9 kg   SpO2 94%   BMI 30.41 kg/m  SpO2: SpO2: 94 % O2 Device: O2 Device: Room Air O2 Flow Rate: O2 Flow Rate (L/min): 2 L/min  Intake/output summary:   Intake/Output Summary (Last 24 hours) at 11/22/2019 1230 Last data filed at 11/22/2019 0635 Gross per 24 hour  Intake 420.71 ml  Output 400 ml  Net  20.71 ml   LBM: Last BM Date: 11/20/19 Baseline Weight: Weight: 96.4 kg Most recent weight: Weight: 98.9 kg       Palliative Assessment/Data:    Flowsheet Rows     Most Recent Value  Intake Tab  Referral Department Hospitalist  Unit at Time of Referral ICU  Date Notified 10/23/19  Palliative Care Type New Palliative care  Reason for referral Clarify Goals of Care  Date of Admission 10/12/19  Date first seen by Palliative Care 10/24/19  # of days Palliative referral response time 1 Day(s)  # of days IP prior to Palliative referral 11  Clinical Assessment  Psychosocial & Spiritual Assessment  Palliative Care Outcomes     Palliative performance scale 30% Patient Active Problem List   Diagnosis Date Noted  . Abscess   . Nontraumatic retroperitoneal hematoma: 11/08/2019 11/09/2019  . Hemorrhagic shock (HCC):11/08/2019 11/09/2019  . Acute respiratory failure with hypoxemia ([place])   . Abdominal fluid collection   . Anemia of chronic disease   . Hypotension   . Gastric ulcer   . Hypokalemia   . NSVT (nonsustained ventricular tachycardia) ([place])   . Diarrhea   . Severe sepsis with septic shock ([place])   . NSTEMI (non-ST elevated myocardial infarction) ([place])   . Acute deep vein thrombosis (DVT) of left upper extremity ([place])   . Acute systolic CHF (congestive heart failure) ([place])   . Peritonitis ([place])   . Epigastric pain   . Palliative care by specialist   . Goals of care, counseling/discussion   . General weakness   . Acute abdominal pain   . Bilateral atelectasis  10/17/2019  . Acute respiratory insufficiency   . Elevated troponin level   . Acute respiratory failure with hypoxia ([place])   . PVCs (premature ventricular contractions)   . Obstipation 10/12/2019  . HLD (hyperlipidemia) 10/12/2019  . Diabetes mellitus type 2, uncontrolled, with complications (Matthew Schwartz) 66/09/3014  . Perforated bowel ([place]) 10/12/2019  . Perforated duodenal ulcer ([place]) 10/12/2019  . Anemia 09/07/2016  . Coronary artery disease due to lipid rich plaque 02/16/2015  . Hyperlipidemia 02/16/2015  . Malignant neoplasm of prostate ([place]) 02/18/2014  . HTN (hypertension) 06/14/2013  . Dyslipidemia 06/14/2013  . GERD (gastroesophageal reflux disease) 06/14/2013    Palliative Care Assessment & Plan   Patient Profile:    Assessment: 86 year old gentleman with coronary artery disease status post percutaneous intervention in the year 2000, hypertension, dyslipidemia, diabetes who was admitted with perforated duodenal ulcer and peritonitis. Cardiology has been following for NSVT, intermittent left bundle branch block and anterolateral T wave inversions as well as elevated troponin, at some point is supposed to undergo cardiac catheterization after acute issues resolved. General surgery also following.  Has NG tube.  To continue TPN.  Plan for another upper GI this week. Frailty deconditioning  Constipation: resolved. BM on 7-25.  Generalized pain  Recommendations/Plan:    Goals of care discussions with patient and son in law at bedside. Patient more awake alert, tolerating more PO, asking about PT, asking about OOB. Patient and family asking if his drain can be removed, anticipate repeat abdominal imaging being done soon. Patient asking about protein base supplements.  Patient and family is hopeful for ongoing improvements and to continue efforts towards rehab, PT, increase PO etc.  We discussed about following repeat imaging and then probably d/c Dilaudid drip, as overall goals  appear to be moving towards rehab efforts, continuation and maintenance of life maintaining therapies and not necessarily with comfort  care as a singular goal. Discussed extensively with patient and family at bedside.     Code Status:    Code Status Orders  (From admission, onward)         Start     Ordered   10/12/19 1208  Full code  Continuous        10/12/19 1212        Code Status History    Date Active Date Inactive Code Status Order ID Comments User Context   02/12/2014 1127 02/13/2014 0336 Full Code [id number]  Arne Cleveland, MD [place] Planning Activity    Advance Directive Documentation     Most Recent Value  Type of Advance Directive Healthcare Power of Attorney, Living will  Pre-existing out of facility DNR order (yellow form or pink MOST form) --  "MOST" Form in Place? --       Prognosis:   Unable to determine Guarded   Discharge Planning: To be determined, ?rehab, inpatient rehab versus SNF rehab, as per patient and family's wishes.    Care plan was discussed with patient and  son in law at bedside   Thank you for allowing the Palliative Medicine Team to assist in the care of this patient.   Total Time 35 Prolonged Time Billed  no    Greater than 50%  of this time was spent counseling and coordinating care related to the above assessment and plan.  Loistine Chance, MD  Please contact Palliative Medicine Team phone at [phone] for questions and concerns.

## 2019-11-22 NOTE — Progress Notes (Signed)
PROGRESS NOTE    Matthew Schwartz  DJS:[id number] DOB: 08-26-1932 DOA: 10/12/2019 PCP: [name], MD   Chief Complaint  Patient presents with  . Abdominal Pain  . Vomiting    Brief Narrative:  [name] NIDDM, HLD, GERD, irritable bowel syndrome (constipation) status post left knee replacement 2009, PTCA 2000-->RCA well as prior prostate CA, former smoker Admit 10/12/2019 severe abdominal pain-ex lap 10/12/2019-perforated duodenal ulcer and remained on vent NSVT postop-echo showed Systolic HF--was on pressors and cardiology consulted-eventually extubated 6/17 + fevers 6/23= postoperative abscess-IR  Placed abd perc drain 6/24--growing Candida /on diflucan On 6/30 upper GI series were performed-and repeated Unfortunately spiked a fever without cause-found to have on CT 7/1 enlarging hematoma General surgery felt this was minimal and not the cause for his fever He has not had high-grade fever since his Antibiotics were adjusted 7/1 vancomycin and Primaxin but continues to have low-grade Heparin was held temporarily until 7/3 and restarted cautiously 7/3 after risk-benefit discussion with family  He has stabilized to some degree on stepdown unit he has had some pain and swelling in his upper extremities likely from the prior DVT in his left upper extremity-his right arm was imaged for DVT and found to be negative He has a long road ahead and TOC is working with patient and family to figure out next steps in terms of placement he may be able to eventually go to an [place]:   Principal Problem:   Severe sepsis with septic shock ([place]) Active Problems:   Hypotension   Hemorrhagic shock (HCC):11/08/2019   Perforated duodenal ulcer ([place])   Peritonitis ([place])   Nontraumatic retroperitoneal hematoma: 11/08/2019   HTN (hypertension)   Malignant neoplasm of prostate ([place])   Hyperlipidemia   Obstipation   HLD (hyperlipidemia)   Diabetes mellitus type 2,  uncontrolled, with complications ([place])   Perforated bowel ([place])   Acute respiratory insufficiency   Elevated troponin level   Acute respiratory failure with hypoxia (HCC)   PVCs (premature ventricular contractions)   Bilateral atelectasis   Acute abdominal pain   Palliative care by specialist   Goals of care, counseling/discussion   General weakness   Epigastric pain   Gastric ulcer   Hypokalemia   NSVT (nonsustained ventricular tachycardia) ([place])   Diarrhea   NSTEMI (non-ST elevated myocardial infarction) ([place])   Acute deep vein thrombosis (DVT) of left upper extremity ([place])   Acute systolic CHF (congestive heart failure) (HCC)   Anemia of chronic disease   Acute respiratory failure with hypoxemia (HCC)   Abdominal fluid collection   Abscess  1 septic shock secondary to peritonitis secondary to perforated duodenal ulcer Status post expiratory laparotomy, omental patch repair of duodenal ulcer per Dr. 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  Currently afebrile.  Normal white count.  Patient noted to have upper GI series 10/28/2019 showing persistent small leak with repeat upper GI series done on 11/03/2018 one of the duodenal bulb likely related to prior surgical repair.  No definite persistent leak identified on today's examination.  Patient noted to have fluid collections 10/21/2019 with drain placement with cultures positive for Candida.  Status post drain placement.  On IV fluconazole.  Patient noted to have a T-max of 102.1 on 7/1/ 2021 with a change in antibiotics from IV vancomycin(s/p 5 days) to IV Merrem.  Blood cultures obtained with no growth to date.  Currently afebrile with a normal white count.  Dr. [name] discussed with general surgery, Dr. [name] on 10/30/2019 and was felt that enlarging hematoma was not source of infection as it would drain into the midline wound.  Continue IV PPI.  Patient s/p 8 days of IV Merrem. Loose stools have  decreased.  C. difficile PCR was negative.  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   Right upper quadrant drain intact.  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  Drain catheter left in place.  Patient was being followed by general surgery and it was noted that patient was being transitioned to comfort/hospice care and as such patient has been allowed to eat and drink for comfort.  General surgery discussed with family and they understood that this will further cause his fistula to not heal but given the desire for transition to comfort it was decided a better way to make patient comfortable.  General surgery has signed off as of 11/16/2019.  Patient was on IV fluconazole which has subsequently been discontinued.  Patient being transitioned to comfort measures however family per palliative care have decided they no longer want comfort measures and would like to try therapy.  Patient tolerating diet which we will continue for now.  NG tube discontinued.  Consult with social work for placement.  Palliative care following.   2.  Hypotension/hemorrhagic shock secondary to large retroperitoneal Patient noted to be hypotensive 8/33/8250 with systolic blood pressures in the 70s requiring transfer to the ICU and pressors of Levophed.  CBC done noted a hemoglobin of 5.9.  CT abdomen and pelvis done consistent with a large retroperitoneal hematoma with evidence of active bleed within the right psoas muscle.  Persistent but decreased size of right rectus muscle hematoma.  Patient was transferred to the ICU and transfused 2 units of FFP, 4 units total of packed red blood cells on 11/08/2019, 11/09/2019.  Levophed subsequently titrated off.  Hemoglobin currently at 8.9 after 4 units of packed red blood cells.  IV heparin has been discontinued and patient no longer a candidate for anticoagulation.  Clonidine patch discontinued.  Nitroglycerin paste discontinued.  IV  Lopressor dose decreased.  PCCM consulted who were following however have signed off.  Patient was transitioned to comfort measures with palliative care following however family have decided per palliative care that they no longer want comfort measures in want to try therapy.  Consult with social work. Palliative care following.  3.  Acute hypoxic respiratory failure Status post extubation 10/15/2019.  NG tube has been discontinued.  Patient with sats of 94% on room air.    4.  Acute systolic heart failure/non-STEMI/coronary artery disease 2000/NSVT Euvolemic.  Blood pressure initially was stable however patient noted to be hypotensive on 5/39/7673 with systolic blood pressures in the 70s.  Clonidine patch discontinued.  IV beta-blocker decreased to 5 mg every 4 hours.  Early on in the hospitalization patient noted to have a 5-6 beat run of nonsustained V. tach however asymptomatic on 11/04/2019.  Potassium at 4.1.  Magnesium at 2.2.  Phosphorus at 4.  IV beta-blocker dose had been increased to 10 mg every 4 hours per cardiology however due to hemorrhagic shock IV Lopressor has been decreased. Continue IV Lopressor 7.5 mg every 8 hours for now.  Patient was transitioned to comfort measures with palliative care following however family have decided they no longer want to focus on comfort measures and would like to try therapy.  Keep magnesium greater than 2.  Keep potassium  greater than 4.    5.  Left upper extremity DVT cephalic/axillary vein Likely secondary to PICC line.  IV heparin discontinued 11/08/2019 due to hemorrhagic shock secondary to large retroperitoneal hematoma.  Follow.  6.  Right upper extremity swelling and pain and discomfort Heparin initially discontinued on 10/29/2019 after enlarging hematoma.  Heparin was resumed without a bolus on 10/31/2019 after Dr. [name] discussed with a specialist.  Right upper extremity discomfort initially felt to be secondary to a DVT however Dopplers negative.   Patient now with large retroperitoneal hematoma with hemorrhagic shock on 11/08/2019 and as such heparin discontinued.  Patient NOT a candidate for anticoagulation at this time.  Continue supportive care.  Patient was being transitioned to comfort measures with palliative care following.  Continue supportive care at this time.    7.  Well-controlled diabetes mellitus type 2 Hemoglobin A1c 6.6.  Patient noted not to be a diabetic prior to admission and elevated CBGs likely was secondary to TPN.  Patient was transitioned to comfort measures.  TPN discontinued..Sliding scale insulin discontinued.  Follow.   8.  Anemia of critical illness/acute blood loss anemia Status post transfusion of 2 units packed red blood cells (10/25/2019).  Patient s/p hemorrhagic shock on 11/08/2019 secondary to large retroperitoneal hematoma.  Status post transfusion of 2 units packed red blood cells 11/08/2019 and 2 units of FFP.  Hemoglobin was at 6.7 on 11/09/2019 and patient transfused another 2 units of packed red blood cells hemoglobin currently at 9.3. Transfusion threshold hemoglobin  <7.  Palliative care following and patient was transitioned to comfort measures however family still trying to explore all options.  Follow.   9.  Malignancy of the prostate Outpatient follow-up.  Patient currently comfort measures with palliative care following however family have decided by palliative care they no longer want to focus on comfort and would like to try therapy.    10.  Tobacco abuse Tobacco cessation.  11.  Diarrhea Per RN patient with voluminous loose stools noted in rectal pouch a about 1 to 2 weeks ago which has since improved.  C. difficile PCR negative.  KUB negative.  Patient status post full course of IV antibiotics.  Patient being followed by palliative care and focus was on comfort measures.  Per palliative care family decided today that no longer wanting comfort measures in 1 to try therapy. Follow.  12.   Constipation Patient given Relistor, Dulcolax suppository x1, some prune juice, and mag citrate as tolerated.  Patient with good bowel movement on 11/19/2019.  Patient with complaints of constipation on 11/21/2019, patient given a Dulcolax suppository, magnesium citrate with good results.   13.  Lower extremity pain Secondary to retroperitoneal hematoma.  Improved clinically after transfusion of packed red blood cells, FFP as well as pain management.   Continue Lidoderm patch, IV Robaxin as needed.  Patient on Dilaudid drip.  Supportive care.  Palliative care following.   14.  Prognosis Patient with multiple comorbidities, complicated course during this hospitalization.  Patient admitted with sepsis secondary to peritonitis, duodenal ulcer with perforation status post repair, complicated by non-STEMI, acute CHF.  Patient with ongoing fistula was on TPN. which is subsequently been discontinued, palliative care following and decision was to transition to comfort measures and as such patient has been placed on a diet for comfort, general surgery signed off, palliative care following to determine disposition of residential hospice versus hospice home.  However daughter called palliative care and initially had wanted patient transferred to [facility].  Palliative care following and per palliative care note family would like to consider the options again.  By palliative care family have decided they no longer want comfort measures in want to try  therapy.  Palliative following and appreciate input and recommendations.     DVT prophylaxis: SCDs Code Status: DNR Family Communication: Updated patient.  No family at bedside. Disposition:   Status is: Inpatient    Dispo:  Patient From:  Home  Planned Disposition:  To be determined.  Likely SNF with palliative care following versus CIR  Expected discharge date:  To be determined.  Medically stable for discharge:   Patient currently being followed by palliative  care and was transitioned to comfort measures.  Per palliative family have decided they no longer want palliative care and would like to try therapy.         Consultants:   Palliative care: Dr. Rowe Pavy 10/24/2019  Cardiology: Dr. Margaretann Loveless 10/13/2019  General surgery: Dr. [name] 10/12/2019  Gastroenterology: Dr. Therisa Doyne 10/12/2019  PCCM: Dr. Halford Chessman 10/12/2019  Procedures:  CT abdomen and pelvis 10/21/2019, 10/29/2019  CT angiogram abdomen and pelvis 10/11/2019  Upper GI series 10/19/2019, 10/28/2019, 11/03/2019  2D echo 10/13/2019, 10/16/2019  Right upper extremity Doppler 11/02/2019  Left upper extremity Dopplers 10/17/2019  CT-guided drain placement right upper quadrant fluid collection per interventional radiology, Dr. Earleen Newport 10/22/2019  Exploratory laparotomy, omental patch repair of duodenal ulcer per Dr. [name] 10/12/2019  Right IJ CVL 10/12/2019>>>> 10/13/2019  Right radial A-line 10/12/2019>>>> 10/15/2019  ETT 10/12/2019>>>> 10/15/2019  Left upper extremity PICC 10/14/2019  Fistulogram 11/06/2019, 10/30/2019  Transfusion 2 units packed red blood cells 10/25/2019  Transfusion of 2 units packed red blood cells 11/08/2019  Transfusion of 2 units FFP 11/08/2019  Transfuse 2 units packed red blood cells 11/09/2019  Significant Hospital Events   6/14 Admit, exploratory laparotomy, omental patch repair of duodenal ulcer 6/16 Off vasopressors, less drainage from abd drain / clearing  6/17 extubated, weak cough, added high flow oxygen 6/18 start heparin gtt 6/20 d/c amiodarone 6/23 new fluid collection on CT abd > Drained by IR 6/24 To [facility] 6/25 PCCM sign off 7/01 back to ICU with fever, tachypnea >> PCCM asked to reassess 7/2comfortable this morning 11/08/2019 patient noted to go into hemorrhagic shock got transferred to the ICU placed on a Levophed drip which was subsequently weaned off.  Transfused 2 units packed red blood cells, 2 units FFP and CT abdomen the pelvis which was done  concerning for large retroperitoneal hematoma.     Significant Diagnostic Tests:   CT angio abd/pelvis 6/13 >> mild atherosclerosis, no significant arterial stenosis of mesenteric vasculature, no findings of bowel ischemia, colonic diverticulosis w/o diverticulitis, b/l renal cysts, 12 mm lesion upper pole Rt kidney  ECHO 6/15 >> poor image quality, appears to be regional wall motion variability but confident wall motion analysis is impossible even after contrast administration, LVEF ~ 24-58%, Grade I diastolic dysfunction, RV systolic function is moderately reduced, RV is mildly enlarged  6/19 venous doppler LUE>>acute deep vein thrombosis involving the left axillary vein, surrounding the PICC line. Findings consistent with acute superficial vein thrombosis involving the left cephalic vein.  UGI 6/21 >> small leak from duodenum  CT ABD/Pelvis 6/23 >>interval midline abdominal incision with incomplete skin surgical closure, probable small hematomas within the anterior abdominal wall and the omentum. Nonspecific 4.3 cm air-fluid collection in the RUQ of the abdomen between the duodenal bulb and gallbladder, not containing enteric contrast. No residual leak from the  duodenal bulb identified. New small bilateral pleural effusions with dependent L>R lower lobe opacities  Upper GI series 6/30 >>Small puckered stump of a leak at the site of the prior elongatedeak. Findings suggest resolving but persistent extraluminalcollection.  CT abd/pelvis 7/01 >>Anterior midline surgical incision is again noted. Large irregular high density is noted in this area most consistent with enlarging subcutaneous or intramuscular hematoma. Interval placement of pigtail drainage catheter in the area of fluid collection noted on prior exam in the right upper quadrant. Fluid collection is significantly decompressed compared to prior exam. Micro Data:  SARS CoV2 6/13 >> negative RUQ fluid collection 6/24 >>  moderate Candida albicans Blood 6/25 >>negative Blood culture 10/29/2019>>> negative     Antimicrobials:   IV Merrem 10/29/2019>>>>>> 11/05/2019  IV Zosyn 10/12/2019>>>> 10/29/2019  IV vancomycin 10/29/2019>>>> 11/03/2019  IV fluconazole 10/25/2019>>>> 11/19/2019   Subjective: Patient stated had bowel movement.  No chest pain.  No shortness of breath.  Tolerating oral intake.  No abdominal pain.  No nausea or vomiting.   Objective: Vitals:   11/19/19 1500 11/19/19 2248 11/20/19 0515 11/20/19 1347  BP: 122/71 (!) 140/81 (!) 131/62 (!) 130/66  Pulse: 97 95 88 90  Resp:  20 20 18   Temp:  98.9 F (37.2 C) 98.1 F (36.7 C) 98.7 F (37.1 C)  TempSrc:  Oral Oral Oral  SpO2:   96% 94%  Weight:      Height:        Intake/Output Summary (Last 24 hours) at 11/22/2019 1246 Last data filed at 11/22/2019 9562 Gross per 24 hour  Intake 420.71 ml  Output 400 ml  Net 20.71 ml   Filed Weights   11/15/19 0444 11/16/19 0500 11/17/19 0852  Weight: 95.5 kg 94.5 kg 98.9 kg    Examination:  General exam: NAD Respiratory system: Lungs clear to auscultation bilaterally anterior lung fields.  No wheezes, no crackles, no rhonchi.  Normal respiratory effort.   Cardiovascular system: Regular rate rhythm no murmurs rubs or gallops.  No JVD.  No lower extremity edema. Gastrointestinal system: Abdomen is mildly distended, soft, positive bowel sounds.  Bandage on midline wound.  Right upper quadrant drain with scant drainage.  Nontender to palpation.   Central nervous system: Alert.  Moving extremities spontaneously.  No focal neurological deficits noted.  Extremities: Symmetric 5 x 5 power. Skin: No rashes, lesions or ulcers Psychiatry: Judgement and insight appear fair. Mood & affect appropriate.     Data Reviewed: I have personally reviewed following labs and imaging studies  CBC: Recent Labs  Lab 11/16/19 0430 11/18/19 1245 11/21/19 0610  WBC 10.3 10.0  --   NEUTROABS  --  7.7  --   HGB  9.0* 8.9* 9.3*  HCT 29.2* 28.8* 29.5*  MCV 91.3 90.0  --   PLT 328 356  --     Basic Metabolic Panel: Recent Labs  Lab 11/16/19 0430 11/18/19 1245 11/21/19 0610  NA 142 139 134*  K 3.9 4.1 3.6  CL 107 105 103  CO2 25 23 21*  GLUCOSE 124* 123* 109*  BUN 35* 29* 20  CREATININE 0.53* 0.74 0.64  CALCIUM 8.3* 8.2* 8.1*    GFR: Estimated Creatinine Clearance: 82.3 mL/min (by C-G formula based on SCr of 0.64 mg/dL).  Liver Function Tests: Recent Labs  Lab 11/16/19 0430 11/18/19 1245  AST 27 63*  ALT 42 78*  ALKPHOS 63 78  BILITOT 0.9 1.5*  PROT 6.4* 6.7  ALBUMIN 1.8* 1.9*    CBG:  No results for input(s): GLUCAP in the last 168 hours.   No results found for this or any previous visit (from the past 240 hour(s)).       Radiology Studies: No results found.      Scheduled Meds: . bisacodyl  10 mg Rectal Daily  . chlorhexidine  15 mL Mouth Rinse BID  . Chlorhexidine Gluconate Cloth  6 each Topical Daily  . lidocaine  2 patch Transdermal Q24H  . magnesium citrate  0.5 Bottle Oral Once  . methylnaltrexone  12 mg Subcutaneous Once  . metoprolol tartrate  7.5 mg Intravenous Q8H  . pantoprazole (PROTONIX) IV  40 mg Intravenous Q24H  . senna-docusate  1 tablet Oral QHS  . simethicone  160 mg Oral QID  . sodium chloride flush  10-40 mL Intracatheter Q12H  . sodium chloride flush  5 mL Intracatheter Q8H   Continuous Infusions: . sodium chloride 250 mL (11/22/19 0004)  . HYDROmorphone 0.25 mg/hr (11/22/19 0600)  . methocarbamol (ROBAXIN) IV 500 mg (11/17/19 0119)     LOS: 41 days    Time spent: 40 minutes    Irine Seal, MD [facility]   To contact the attending provider between 7A-7P or the covering provider during after hours 7P-7A, please log into the web site [email or web address] and access using universal [place] password for that web site. If you do not have the password, please call the hospital operator.  11/22/2019, 12:46 PM

## 2019-11-23 MED ORDER — ALTEPLASE 2 MG IJ SOLR
2.0000 mg | Freq: Once | INTRAMUSCULAR | Status: AC
  Administered 2019-11-23: 2 mg
  Filled 2019-11-23: qty 2

## 2019-11-23 MED ORDER — METOPROLOL TARTRATE 50 MG PO TABS
50.0000 mg | ORAL_TABLET | Freq: Two times a day (BID) | ORAL | Status: DC
  Administered 2019-11-23 – 2019-11-30 (×16): 50 mg via ORAL
  Filled 2019-11-23 (×17): qty 1

## 2019-11-23 MED ORDER — ACETAMINOPHEN 325 MG PO TABS
650.0000 mg | ORAL_TABLET | Freq: Three times a day (TID) | ORAL | Status: DC
  Administered 2019-11-23 – 2019-12-01 (×24): 650 mg via ORAL
  Filled 2019-11-23 (×24): qty 2

## 2019-11-23 MED ORDER — PANTOPRAZOLE SODIUM 40 MG PO TBEC
40.0000 mg | DELAYED_RELEASE_TABLET | Freq: Every day | ORAL | Status: DC
  Administered 2019-11-23 – 2019-11-30 (×8): 40 mg via ORAL
  Filled 2019-11-23 (×8): qty 1

## 2019-11-23 NOTE — Progress Notes (Addendum)
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 Authorizing Provider  alfuzosin (UROXATRAL) 10 MG 24 hr tablet Take 10 mg by mouth daily. 09/07/19  Yes [provider]  amLODipine (NORVASC) 5 MG tablet Take 5 mg by mouth daily. 07/13/19  Yes [provider]  aspirin 81 MG tablet Take 81 mg by mouth every morning.    Yes [provider]  atorvastatin (LIPITOR) 40 MG tablet Take 40 mg by mouth at bedtime.    Yes [provider]  dicyclomine (BENTYL) 20 MG tablet Take 1 tablet (20 mg total) by mouth 2 (two) times daily. 10/11/19  Yes Nuala Alpha A, PA-C  famotidine (PEPCID) 20 MG tablet Take 20 mg by mouth 2 (two) times daily.   Yes [provider]  hydrocortisone valerate cream (WESTCORT) 0.2 % Apply 1 application topically 2 (two) times daily.  09/20/19  Yes [provider]  isosorbide mononitrate (IMDUR) 60 MG 24 hr tablet Take 60 mg by mouth every evening.   Yes [provider]  lactulose (CHRONULAC) 10 GM/15ML solution Take 10 g by mouth as needed for mild constipation.   Yes [provider]  metFORMIN (GLUCOPHAGE) 500 MG tablet Take 500 mg by mouth 2 (two) times daily. 09/20/19  Yes [provider]  metoprolol succinate (TOPROL-XL) 50 MG 24 hr tablet Take 50 mg by mouth 2 (two) times daily. 04/07/14  Yes [provider]  potassium chloride (KLOR-CON) 10 MEQ tablet Take 2 tablets (20 mEq total) by mouth daily for 3 days. 10/11/19 10/14/19 Yes Nuala Alpha A, PA-C  PROCTOSOL HC 2.5 % rectal cream Place 1 application rectally daily as needed for hemorrhoids or itching.  11/06/13  Yes [provider]  ramipril (ALTACE) 10 MG capsule Take 10 mg by mouth 2 (two) times daily.    Yes [provider]  RESTASIS 0.05 % ophthalmic emulsion Place 1 drop into both eyes 2 (two) times daily.  09/09/19  Yes [provider]  senna-docusate (SENOKOT-S) 8.6-50 MG tablet Take 1 tablet by mouth daily. 10/09/19  Yes Lucrezia Starch, MD  [facility] [place] injection Inject as directed once. 04/10/17   [provider]     Vital Signs: BP (!) 120/63 (BP Location: Right Arm)    Pulse 90    Temp 98.7 F (37.1 C) (Oral)    Resp 18    Ht 5\' 11"  (1.803 m)    Wt 218 lb 0.6 oz (98.9 kg)    SpO2 94%    BMI 30.41 kg/m   Physical Exam awake/alert; RUQ drain intact, insertion site ok, OP 40 cc green fluid  Imaging: No results found.  Labs:  CBC: Recent Labs    11/12/19 0455 11/12/19 0455 11/15/19 0415 11/16/19 0430 11/18/19 1245 11/21/19 0610  WBC 12.2*  --  10.7* 10.3 10.0  --   HGB 9.3*   < > 9.3* 9.0* 8.9* 9.3*  HCT 29.8*   < > 30.4* 29.2* 28.8* 29.5*  PLT 295  --  313 328 356  --    < > =  values in this interval not displayed.    COAGS: Recent Labs    11/08/19 1525  INR 1.5*    BMP: Recent Labs    11/15/19 0415 11/16/19 0430 11/18/19 1245 11/21/19 0610  NA 142 142 139 134*  K 4.0 3.9 4.1 3.6  CL 107 107 105 103  CO2 26 25 23  21*  GLUCOSE 140* 124* 123* 109*  BUN 37* 35* 29* 20  CALCIUM 8.2* 8.3* 8.2* 8.1*  CREATININE 0.61 0.53* 0.74 0.64  GFRNONAA >60 >60 >60 >60  GFRAA >60 >60 >60 >60    LIVER FUNCTION TESTS: Recent Labs    11/14/19 0600 11/15/19 0415 11/16/19 0430 11/18/19 1245  BILITOT 1.0 0.9 0.9 1.5*  AST 32 27 27 63*  ALT 57* 49* 42 78*  ALKPHOS 65 66 63 78  PROT 5.9* 6.6 6.4* 6.7  ALBUMIN 1.7* 1.9* 1.8* 1.9*    Assessment and Plan: Patient with history of repair of perforated  duodenal ulcer andpost opright upper quadrant fluid collection, status post drain placement on 6/24;known fistula of fluid coll to prox duodenum; last drain injection on 7/19 showed 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 Rowe Robert, PA-C 11/23/2019, 2:19 PM   I spent a total of 15 minutes at the the patient's bedside AND on the patient's hospital floor or unit, greater than 50% of which was counseling/coordinating care for abdominal fluid collection drain    Patient ID: Matthew Schwartz, male   DOB: 1933-02-06, 86 y.o.   MRN: [id number]

## 2019-11-23 NOTE — Progress Notes (Signed)
PROGRESS NOTE    Matthew Schwartz  ZHY:[id number] DOB: 07/09/1932 DOA: 10/12/2019 PCP: [name], MD   Chief Complaint  Patient presents with  . Abdominal Pain  . Vomiting    Brief Narrative:  [name] NIDDM, HLD, GERD, irritable bowel syndrome (constipation) status post left knee replacement 2009, PTCA 2000-->RCA well as prior prostate CA, former smoker Admit 10/12/2019 severe abdominal pain-ex lap 10/12/2019-perforated duodenal ulcer and remained on vent NSVT postop-echo showed Systolic HF--was on pressors and cardiology consulted-eventually extubated 6/17 + fevers 6/23= postoperative abscess-IR  Placed abd perc drain 6/24--growing Candida /on diflucan On 6/30 upper GI series were performed-and repeated Unfortunately spiked a fever without cause-found to have on CT 7/1 enlarging hematoma General surgery felt this was minimal and not the cause for his fever He has not had high-grade fever since his Antibiotics were adjusted 7/1 vancomycin and Primaxin but continues to have low-grade Heparin was held temporarily until 7/3 and restarted cautiously 7/3 after risk-benefit discussion with family  He has stabilized to some degree on stepdown unit he has had some pain and swelling in his upper extremities likely from the prior DVT in his left upper extremity-his right arm was imaged for DVT and found to be negative He has a long road ahead and TOC is working with patient and family to figure out next steps in terms of placement he may be able to eventually go to an [place]:   Principal Problem:   Severe sepsis with septic shock ([place]) Active Problems:   Hypotension   Hemorrhagic shock (HCC):11/08/2019   Perforated duodenal ulcer ([place])   Peritonitis ([place])   Nontraumatic retroperitoneal hematoma: 11/08/2019   HTN (hypertension)   Malignant neoplasm of prostate ([place])   Hyperlipidemia   Obstipation   HLD (hyperlipidemia)   Diabetes mellitus type 2,  uncontrolled, with complications ([place])   Perforated bowel ([place])   Acute respiratory insufficiency   Elevated troponin level   Acute respiratory failure with hypoxia (HCC)   PVCs (premature ventricular contractions)   Bilateral atelectasis   Acute abdominal pain   Palliative care by specialist   Goals of care, counseling/discussion   General weakness   Epigastric pain   Gastric ulcer   Hypokalemia   NSVT (nonsustained ventricular tachycardia) ([place])   Diarrhea   NSTEMI (non-ST elevated myocardial infarction) ([place])   Acute deep vein thrombosis (DVT) of left upper extremity ([place])   Acute systolic CHF (congestive heart failure) (HCC)   Anemia of chronic disease   Acute respiratory failure with hypoxemia (HCC)   Abdominal fluid collection   Abscess  1 septic shock secondary to peritonitis secondary to perforated duodenal ulcer Status post expiratory laparotomy, omental patch repair of duodenal ulcer per Dr. 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  Currently afebrile.  Normal white count.  Patient noted to have upper GI series 10/28/2019 showing persistent small leak with repeat upper GI series done on 11/03/2018 one of the duodenal bulb likely related to prior surgical repair.  No definite persistent leak identified on today's examination.  Patient noted to have fluid collections 10/21/2019 with drain placement with cultures positive for Candida.  Status post drain placement.  On IV fluconazole.  Patient noted to have a T-max of 102.1 on 7/1/ 2021 with a change in antibiotics from IV vancomycin(s/p 5 days) to IV Merrem.  Blood cultures obtained with no growth to date.  Currently afebrile with a normal white count.  Dr. [name] discussed with general surgery, Dr. [name] on 10/30/2019 and was felt that enlarging hematoma was not source of infection as it would drain into the midline wound.  Continue IV PPI.  Patient s/p 8 days of IV Merrem. Loose stools have  decreased.  C. difficile PCR was negative.  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   Right upper quadrant drain intact.  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  Drain catheter left in place.  Patient was being followed by general surgery and it was noted that patient was being transitioned to comfort/hospice care and as such patient has been allowed to eat and drink for comfort.  General surgery discussed with family and they understood that this will further cause his fistula to not heal but given the desire for transition to comfort it was decided a better way to make patient comfortable.  General surgery has signed off as of 11/16/2019.  Patient was on IV fluconazole which has subsequently been discontinued.  Patient being transitioned to comfort measures however family per palliative care have decided they no longer want comfort measures and would like to try therapy.  Patient tolerating diet which we will continue for now.  NG tube discontinued.  Will have patient reassessed by IR to see whether repeat fistulogram indicated at this time and recommendations for drain.  Patient also need to follow-up with general surgery closely on discharge.  Consult with social work for placement.  Palliative care following.   2.  Hypotension/hemorrhagic shock secondary to large retroperitoneal Patient noted to be hypotensive 12/27/5619 with systolic blood pressures in the 70s requiring transfer to the ICU and pressors of Levophed.  CBC done noted a hemoglobin of 5.9.  CT abdomen and pelvis done consistent with a large retroperitoneal hematoma with evidence of active bleed within the right psoas muscle.  Persistent but decreased size of right rectus muscle hematoma.  Patient was transferred to the ICU and transfused 2 units of FFP, 4 units total of packed red blood cells on 11/08/2019, 11/09/2019.  Levophed subsequently titrated off.  Hemoglobin currently at 8.9  after 4 units of packed red blood cells.  IV heparin has been discontinued and patient no longer a candidate for anticoagulation.  Clonidine patch discontinued.  Nitroglycerin paste discontinued.  IV Lopressor dose decreased and patient will be transition to oral Lopressor.  PCCM consulted who were following however have signed off.  Patient was transitioned to comfort measures with palliative care following however family have decided per palliative care that they no longer want comfort measures and want to try therapy.  Consult with social work. Palliative care following.  3.  Acute hypoxic respiratory failure Status post extubation 10/15/2019.  NG tube has been discontinued.  Patient with sats of 94% on room air.    4.  Acute systolic heart failure/non-STEMI/coronary artery disease 2000/NSVT Euvolemic.  Blood pressure initially was stable however patient noted to be hypotensive on 07/05/6576 with systolic blood pressures in the 70s.  Clonidine patch discontinued.  IV beta-blocker decreased to 5 mg every 4 hours.  Early on in the hospitalization patient noted to have a 5-6 beat run of nonsustained V. tach however asymptomatic on 11/04/2019.  Potassium at 4.1.  Magnesium at 2.2.  Phosphorus at 4.  IV beta-blocker dose had been increased to 10 mg every 4 hours per cardiology however due to hemorrhagic shock IV Lopressor has been decreased.  Patient on IV Lopressor 7.5 mg every 8  hours and will transition to oral Lopressor as recommended per cardiology early on during the hospitalization. Patient was transitioned to comfort measures with palliative care following however family have decided they no longer want to focus on comfort measures and would like to try therapy.  Keep magnesium > 2.  Keep potassium > 4.    5.  Left upper extremity DVT cephalic/axillary vein Likely secondary to PICC line.  IV heparin discontinued 11/08/2019 due to hemorrhagic shock secondary to large retroperitoneal hematoma.    6.  Right  upper extremity swelling and pain and discomfort Heparin initially discontinued on 10/29/2019 after enlarging hematoma.  Heparin was resumed without a bolus on 10/31/2019 after Dr. [name] discussed with a specialist.  Right upper extremity discomfort initially felt to be secondary to a DVT however Dopplers negative.  Patient now with large retroperitoneal hematoma with hemorrhagic shock on 11/08/2019 and as such heparin discontinued.  Patient NOT a candidate for anticoagulation at this time.  Continue supportive care.  Patient was being transitioned to comfort measures with palliative care following however family have decided they want to try therapy at this time..  Continue supportive care at this time.    7.  Well-controlled diabetes mellitus type 2 Hemoglobin A1c 6.6.  Patient noted not to be a diabetic prior to admission and elevated CBGs likely was secondary to TPN.  Patient was transitioned to comfort measures.  TPN discontinued..Sliding scale insulin discontinued.  Follow.   8.  Anemia of critical illness/acute blood loss anemia Status post transfusion of 2 units packed red blood cells (10/25/2019).  Patient s/p hemorrhagic shock on 11/08/2019 secondary to large retroperitoneal hematoma.  Status post transfusion of 2 units packed red blood cells 11/08/2019 and 2 units of FFP.  Hemoglobin was at 6.7 on 11/09/2019 and patient transfused another 2 units of packed red blood cells hemoglobin currently at 9.3(11/21/2019). Transfusion threshold hemoglobin  <7.  Palliative care following and patient was initially transitioned to comfort measures however family still trying to explore all options and no longer want comfort measures and would like to try therapy..  Follow.   9.  Malignancy of the prostate Outpatient follow-up.  Patient currently comfort measures with palliative care following however family have decided by palliative care they no longer want to focus on comfort and would like to try therapy.    10.   Tobacco abuse Tobacco cessation.  11.  Diarrhea Per RN patient with voluminous loose stools noted in rectal pouch a about 1 to 2 weeks ago which has since improved.  C. difficile PCR negative.  KUB negative.  Patient status post full course of IV antibiotics.  Patient being followed by palliative care and focus was on comfort measures.  Per palliative care family decided on 11/22/2019, that no longer wanting comfort measures and willing to try therapy. Follow.  12.  Constipation Patient given Relistor, Dulcolax suppository x1 given, some prune juice, and mag citrate as tolerated on 11/19/2019.  Patient with good bowel movement on 11/19/2019.  Patient with complaints of constipation on 11/21/2019, patient given a Dulcolax suppository, magnesium citrate with good results.  Continue current bowel regimen.  13.  Lower extremity pain Secondary to retroperitoneal hematoma.  Improved clinically after transfusion of packed red blood cells, FFP as well as pain management.   Continue Lidoderm patch, IV Robaxin as needed.  Patient on Dilaudid drip which is being weaned off..  Supportive care.  Palliative care following.   14.  Prognosis Patient with multiple comorbidities, complicated  course during this hospitalization.  Patient admitted with sepsis secondary to peritonitis, duodenal ulcer with perforation status post repair, complicated by non-STEMI, acute CHF.  Patient with ongoing fistula was on TPN. which is subsequently been discontinued, palliative care following and decision was to transition to comfort measures and as such patient has been placed on a diet for comfort, general surgery signed off, palliative care following to determine disposition of residential hospice versus hospice home.  However daughter called palliative care and initially had wanted patient transferred to [facility].  Palliative care following and per palliative care note family would like to consider the options again.  By palliative care  family have decided they no longer want comfort measures in want to try  therapy.  Palliative following and appreciate input and recommendations.  Social work consulted for SNF placement.    DVT prophylaxis: SCDs Code Status: DNR Family Communication: Updated patient and daughter at bedside. Disposition:   Status is: Inpatient    Dispo:  Patient From:  Home  Planned Disposition:  To be determined.  Likely SNF with palliative care following versus CIR  Expected discharge date:  To be determined.  Medically stable for discharge:   Patient currently being followed by palliative care and was transitioned to comfort measures.  Per palliative family have decided they no longer want palliative care and would like to try therapy.  Social work consulted for SNF.        Consultants:   Palliative care: Dr. Rowe Pavy 10/24/2019  Cardiology: Dr. Margaretann Loveless 10/13/2019  General surgery: Dr. [name] 10/12/2019  Gastroenterology: Dr. Therisa Doyne 10/12/2019  PCCM: Dr. Halford Chessman 10/12/2019  Procedures:  CT abdomen and pelvis 10/21/2019, 10/29/2019  CT angiogram abdomen and pelvis 10/11/2019  Upper GI series 10/19/2019, 10/28/2019, 11/03/2019  2D echo 10/13/2019, 10/16/2019  Right upper extremity Doppler 11/02/2019  Left upper extremity Dopplers 10/17/2019  CT-guided drain placement right upper quadrant fluid collection per interventional radiology, Dr. Earleen Newport 10/22/2019  Exploratory laparotomy, omental patch repair of duodenal ulcer per Dr. [name] 10/12/2019  Right IJ CVL 10/12/2019>>>> 10/13/2019  Right radial A-line 10/12/2019>>>> 10/15/2019  ETT 10/12/2019>>>> 10/15/2019  Left upper extremity PICC 10/14/2019  Fistulogram 11/06/2019, 10/30/2019  Transfusion 2 units packed red blood cells 10/25/2019  Transfusion of 2 units packed red blood cells 11/08/2019  Transfusion of 2 units FFP 11/08/2019  Transfuse 2 units packed red blood cells 11/09/2019  Significant Hospital Events   6/14 Admit, exploratory  laparotomy, omental patch repair of duodenal ulcer 6/16 Off vasopressors, less drainage from abd drain / clearing  6/17 extubated, weak cough, added high flow oxygen 6/18 start heparin gtt 6/20 d/c amiodarone 6/23 new fluid collection on CT abd > Drained by IR 6/24 To [place] 6/25 PCCM sign off 7/01 back to ICU with fever, tachypnea >> PCCM asked to reassess 7/2comfortable this morning 11/08/2019 patient noted to go into hemorrhagic shock got transferred to the ICU placed on a Levophed drip which was subsequently weaned off.  Transfused 2 units packed red blood cells, 2 units FFP and CT abdomen the pelvis which was done concerning for large retroperitoneal hematoma.     Significant Diagnostic Tests:   CT angio abd/pelvis 6/13 >> mild atherosclerosis, no significant arterial stenosis of mesenteric vasculature, no findings of bowel ischemia, colonic diverticulosis w/o diverticulitis, b/l renal cysts, 12 mm lesion upper pole Rt kidney  ECHO 6/15 >> poor image quality, appears to be regional wall motion variability but confident wall motion analysis is impossible even after contrast administration, LVEF ~ 35-40%,  Grade I diastolic dysfunction, RV systolic function is moderately reduced, RV is mildly enlarged  6/19 venous doppler LUE>>acute deep vein thrombosis involving the left axillary vein, surrounding the PICC line. Findings consistent with acute superficial vein thrombosis involving the left cephalic vein.  UGI 6/21 >> small leak from duodenum  CT ABD/Pelvis 6/23 >>interval midline abdominal incision with incomplete skin surgical closure, probable small hematomas within the anterior abdominal wall and the omentum. Nonspecific 4.3 cm air-fluid collection in the RUQ of the abdomen between the duodenal bulb and gallbladder, not containing enteric contrast. No residual leak from the duodenal bulb identified. New small bilateral pleural effusions with dependent L>R lower lobe opacities  Upper  GI series 6/30 >>Small puckered stump of a leak at the site of the prior elongatedeak. Findings suggest resolving but persistent extraluminalcollection.  CT abd/pelvis 7/01 >>Anterior midline surgical incision is again noted. Large irregular high density is noted in this area most consistent with enlarging subcutaneous or intramuscular hematoma. Interval placement of pigtail drainage catheter in the area of fluid collection noted on prior exam in the right upper quadrant. Fluid collection is significantly decompressed compared to prior exam. Micro Data:  SARS CoV2 6/13 >> negative RUQ fluid collection 6/24 >> moderate Candida albicans Blood 6/25 >>negative Blood culture 10/29/2019>>> negative     Antimicrobials:   IV Merrem 10/29/2019>>>>>> 11/05/2019  IV Zosyn 10/12/2019>>>> 10/29/2019  IV vancomycin 10/29/2019>>>> 11/03/2019  IV fluconazole 10/25/2019>>>> 11/19/2019   Subjective: Patient sitting in recliner.  Daughter at bedside.  Denies any chest pain or shortness of breath.  No abdominal pain.  No nausea or emesis.  Stated had a bowel movement yesterday.   Objective: Vitals:   11/19/19 2248 11/20/19 0515 11/20/19 1347 11/23/19 1024  BP: (!) 140/81 (!) 131/62 (!) 130/66 (!) 120/63  Pulse: 95 88 90 90  Resp: 20 20 18    Temp:  98.1 F (36.7 C) 98.7 F (37.1 C)   TempSrc: Oral Oral Oral   SpO2:  96% 94%   Weight:      Height:        Intake/Output Summary (Last 24 hours) at 11/23/2019 1226 Last data filed at 11/23/2019 0641 Gross per 24 hour  Intake 490 ml  Output 900 ml  Net -410 ml   Filed Weights   11/15/19 0444 11/16/19 0500 11/17/19 0852  Weight: 95.5 kg 94.5 kg 98.9 kg    Examination:  General exam: NAD Respiratory system: CTAB anterior lung fields.  No wheezes, no crackles, no rhonchi.  Normal respiratory effort.  Cardiovascular system: RRR no murmurs rubs or gallops.  No JVD.  No lower extremity edema.  Gastrointestinal system: Abdomen is distended, soft,  positive bowel sounds, nontender to palpation.  Bandage on midline wound.  Right upper drain with some scant drainage noted.  Central nervous system: Alert.  Moving extremities spontaneously.  No focal neurological deficits noted.  Extremities: Symmetric 5 x 5 power. Skin: No rashes, lesions or ulcers Psychiatry: Judgement and insight appear fair. Mood & affect appropriate.     Data Reviewed: I have personally reviewed following labs and imaging studies  CBC: Recent Labs  Lab 11/18/19 1245 11/21/19 0610  WBC 10.0  --   NEUTROABS 7.7  --   HGB 8.9* 9.3*  HCT 28.8* 29.5*  MCV 90.0  --   PLT 356  --     Basic Metabolic Panel: Recent Labs  Lab 11/18/19 1245 11/21/19 0610  NA 139 134*  K 4.1 3.6  CL 105 103  CO2 23 21*  GLUCOSE 123* 109*  BUN 29* 20  CREATININE 0.74 0.64  CALCIUM 8.2* 8.1*    GFR: Estimated Creatinine Clearance: 82.3 mL/min (by C-G formula based on SCr of 0.64 mg/dL).  Liver Function Tests: Recent Labs  Lab 11/18/19 1245  AST 63*  ALT 78*  ALKPHOS 78  BILITOT 1.5*  PROT 6.7  ALBUMIN 1.9*    CBG: No results for input(s): GLUCAP in the last 168 hours.   No results found for this or any previous visit (from the past 240 hour(s)).       Radiology Studies: No results found.      Scheduled Meds: . bisacodyl  10 mg Rectal Daily  . chlorhexidine  15 mL Mouth Rinse BID  . Chlorhexidine Gluconate Cloth  6 each Topical Daily  . feeding supplement (PRO-STAT SUGAR FREE 64)  30 mL Oral BID  . lidocaine  2 patch Transdermal Q24H  . metoprolol tartrate  50 mg Oral BID  . pantoprazole  40 mg Oral QHS  . senna-docusate  1 tablet Oral QHS  . simethicone  160 mg Oral QID  . sodium chloride flush  10-40 mL Intracatheter Q12H  . sodium chloride flush  5 mL Intracatheter Q8H   Continuous Infusions: . sodium chloride 250 mL (11/22/19 0004)  . HYDROmorphone 0.25 mg/hr (11/22/19 0600)  . methocarbamol (ROBAXIN) IV 500 mg (11/17/19 0119)      LOS: 42 days    Time spent: 40 minutes    Irine Seal, MD [facility]   To contact the attending provider between 7A-7P or the covering provider during after hours 7P-7A, please log into the web site [email or web address] and access using universal [place] password for that web site. If you do not have the password, please call the hospital operator.  11/23/2019, 12:26 PM

## 2019-11-23 NOTE — Progress Notes (Signed)
Physical Therapy Treatment Patient Details Name: Matthew Schwartz MRN: [id number] DOB: 16-Apr-1933 Today's Date: 11/23/2019    History of Present Illness Pt s/p exploratory lap (10/12/19) to repair perforated duodenal ulcer and then on vent with extubation 10/14/19.  Pt with acute respiratory failure with hypoxia and acute systolic CHF in setting of sepsis and peritonitis.  Pt with hx of DM, prostate CA and bil TKR    PT Comments     Pt alert, talkative today. Participates with minimal encouragement. Globally weak and deconditioned however willing to attempt EOB. Demonstrates increasing strength and movement all 4 extremities today. Sat EOB then returned to supine and used  maxi-move to assist OOB to chair. Pt and family are very interested in CIR (noted pt has [employer]). Pt's dtr reports they will be able to assist and/or hire assist as needed at home but would like pt to be as independent as possible, pt would like to be able to be able to amb to bathroom. Will continue to follow in acute setting.   Follow Up Recommendations  CIR;Supervision/Assistance - 24 hour (family would like to try for CIR)     Equipment Recommendations  None recommended by PT    Recommendations for Other Services        Precautions / Restrictions Precautions Precautions: Fall Precaution Comments: 1 right drain--abd, may have loose BM    Mobility  Bed Mobility Overal bed mobility: Needs Assistance Bed Mobility: Rolling;Sidelying to Sit Rolling: Max assist;Mod assist;+2 for physical assistance;+2 for safety/equipment Sidelying to sit: Max assist;+2 for physical assistance;+2 for safety/equipment;HOB elevated   Sit to supine: Total assist;+2 for physical assistance;+2 for safety/equipment   General bed mobility comments: multi-modal cues for techniques and self assist. +2 to assist with trunk elevation, facilitate LEs off bed. BP upon returning to supine 121/58, HR 79-112 (tachy), SpO2=98%   Transfers Overall  transfer level: Needs assistance               General transfer comment: pt sat EOB, fatigued and dizzy so returned to supine to place [employer] pad   Ambulation/Gait                 Stairs             Wheelchair Mobility    Modified Rankin (Stroke Patients Only)       Balance Overall balance assessment: Needs assistance Sitting-balance support: Feet supported;Single extremity supported Sitting balance-Leahy Scale: Poor Sitting balance - Comments: posterior and left lateral lean in sitting, max assist of 1 to maintain sitting EOB x 6 minutes,  improves to midline with cues and assist  Postural control: Posterior lean;Left lateral lean                                  Cognition Arousal/Alertness: Awake/alert Behavior During Therapy: WFL for tasks assessed/performed Overall Cognitive Status: Within Functional Limits for tasks assessed                                 General Comments: talking more today, participatory, joking      Exercises General Exercises - Lower Extremity Ankle Circles/Pumps: AROM;Both;10 reps Heel Slides: AAROM;Both;10 reps;Supine;Limitations Heel Slides Limitations: right knee pain, with flexion past 70 degrees Straight Leg Raises: AROM;AAROM;Both;5 reps;Supine    General Comments        Pertinent Vitals/Pain Pain Assessment: Faces Faces  Pain Scale: Hurts a little bit Pain Location: legs, R wrist Pain Descriptors / Indicators: Sore;Grimacing;Guarding;Moaning Pain Intervention(s): Limited activity within patient's tolerance;Monitored during session    Home Living                      Prior Function            PT Goals (current goals can now be found in the care plan section) Acute Rehab PT Goals PT Goal Formulation: With patient/family Time For Goal Achievement: 11/18/19 Potential to Achieve Goals: Fair Progress towards PT goals: Progressing toward goals    Frequency    Min  2X/week      PT Plan Current plan remains appropriate    Co-evaluation              AM-PAC PT "6 Clicks" Mobility   Outcome Measure  Help needed turning from your back to your side while in a flat bed without using bedrails?: Total Help needed moving from lying on your back to sitting on the side of a flat bed without using bedrails?: Total Help needed moving to and from a bed to a chair (including a wheelchair)?: Total Help needed standing up from a chair using your arms (e.g., wheelchair or bedside chair)?: Total Help needed to walk in hospital room?: Total Help needed climbing 3-5 steps with a railing? : Total 6 Click Score: 6    End of Session   Activity Tolerance: Patient tolerated treatment well;Patient limited by fatigue Patient left: with call bell/phone within reach;with family/visitor present;in chair;with chair alarm set Nurse Communication: Mobility status;Need for lift equipment PT Visit Diagnosis: Muscle weakness (generalized) (M62.81);Other abnormalities of gait and mobility (R26.89)     Time: 3295-1884 PT Time Calculation (min) (ACUTE ONLY): 60 min  Charges:  $Therapeutic Exercise: 8-22 mins $Therapeutic Activity: 38-52 mins                     Baxter Flattery, PT  Acute Rehab Dept ([place]) [phone] Pager [phone]  11/23/2019    [facility] 11/23/2019, 12:53 PM

## 2019-11-23 NOTE — Progress Notes (Signed)

## 2019-11-23 NOTE — Progress Notes (Signed)
Palliative Care Progress Note  Matthew Schwartz has consistently shown daily improvement since being transitioned from the ICU to comfort care on 7/19. He has been alert, his pain has been well controlled, his constipation has been treated and he has increased his PO intake significantly over the course of the past week. He has mostly consumed liquids and supplements - possibly in amounts sufficient to meet his nutritional needs. His strength has definitely improved. He is able to pull him self up in the bed and participate in his care more.  At this point the patient and family's goals of care are moving towards rehab efforts, continuation and maintenance of life maintaining therapies and not necessarily with comfort care as a singular goal. They are however are not requesting overly aggressive and invasive management and recognize he has responded best to a less invasive approach and a QOL focused approach. Discussed extensively with patient and family at bedside.   He is motivated and getting stronger from my assessment and I support his attempt at rehab and getting stronger and being able to return home. I support rehab and CIR request.  Recommendations:  1. Will reduce his hydromorphone infusion by half- to .10mg /hr- will assess his pain and transiition to lowest possible duragesic dose for ongoing pain control.  2. Fistualgram tomorrow. We discussed drainage and ongoing management.  Goals is to progress towards rehab.  Lane Hacker, DO Palliative Medicine  Time: 35 min Greater than 50%  of this time was spent counseling and coordinating care related to the above assessment and plan.

## 2019-11-24 LAB — CBC
HCT: 27.2 % — ABNORMAL LOW (ref 39.0–52.0)
Hemoglobin: 8.5 g/dL — ABNORMAL LOW (ref 13.0–17.0)
MCH: 27.2 pg (ref 26.0–34.0)
MCHC: 31.3 g/dL (ref 30.0–36.0)
MCV: 86.9 fL (ref 80.0–100.0)
Platelets: 325 10*3/uL (ref 150–400)
RBC: 3.13 MIL/uL — ABNORMAL LOW (ref 4.22–5.81)
RDW: 16.7 % — ABNORMAL HIGH (ref 11.5–15.5)
WBC: 10.3 10*3/uL (ref 4.0–10.5)
nRBC: 0 % (ref 0.0–0.2)

## 2019-11-24 LAB — GLUCOSE, CAPILLARY
Glucose-Capillary: 138 mg/dL — ABNORMAL HIGH (ref 70–99)
Glucose-Capillary: 136 mg/dL — ABNORMAL HIGH (ref 70–99)

## 2019-11-24 MED ORDER — LORAZEPAM 2 MG/ML PO CONC
1.0000 mg | Freq: Every day | ORAL | Status: DC
  Administered 2019-11-25 – 2019-11-30 (×6): 1 mg via ORAL
  Filled 2019-11-24 (×6): qty 1

## 2019-11-24 MED ORDER — MELATONIN 5 MG PO TABS: 5.0000 mg | ORAL_TABLET | Freq: Every day | ORAL | Status: DC

## 2019-11-24 NOTE — Progress Notes (Signed)
PROGRESS NOTE    Matthew Schwartz  LHT:[id number] DOB: 1932-09-18 DOA: 10/12/2019 PCP: [name], MD   Chief Complaint  Patient presents with  . Abdominal Pain  . Vomiting    Brief Narrative:  [name] NIDDM, HLD, GERD, irritable bowel syndrome (constipation) status post left knee replacement 2009, PTCA 2000-->RCA well as prior prostate CA, former smoker Admit 10/12/2019 severe abdominal pain-ex lap 10/12/2019-perforated duodenal ulcer and remained on vent NSVT postop-echo showed Systolic HF--was on pressors and cardiology consulted-eventually extubated 6/17 + fevers 6/23= postoperative abscess-IR  Placed abd perc drain 6/24--growing Candida /on diflucan On 6/30 upper GI series were performed-and repeated Unfortunately spiked a fever without cause-found to have on CT 7/1 enlarging hematoma General surgery felt this was minimal and not the cause for his fever He has not had high-grade fever since his Antibiotics were adjusted 7/1 vancomycin and Primaxin but continues to have low-grade Heparin was held temporarily until 7/3 and restarted cautiously 7/3 after risk-benefit discussion with family  He has stabilized to some degree on stepdown unit he has had some pain and swelling in his upper extremities likely from the prior DVT in his left upper extremity-his right arm was imaged for DVT and found to be negative He has a long road ahead and TOC is working with patient and family to figure out next steps in terms of placement he may be able to eventually go to an [place]:   Principal Problem:   Severe sepsis with septic shock ([place]) Active Problems:   Hypotension   Hemorrhagic shock (HCC):11/08/2019   Perforated duodenal ulcer ([place])   Peritonitis ([place])   Nontraumatic retroperitoneal hematoma: 11/08/2019   HTN (hypertension)   Malignant neoplasm of prostate ([place])   Hyperlipidemia   Obstipation   HLD (hyperlipidemia)   Diabetes mellitus type 2,  uncontrolled, with complications ([place])   Perforated bowel ([place])   Acute respiratory insufficiency   Elevated troponin level   Acute respiratory failure with hypoxia (HCC)   PVCs (premature ventricular contractions)   Bilateral atelectasis   Acute abdominal pain   Palliative care by specialist   Goals of care, counseling/discussion   General weakness   Epigastric pain   Gastric ulcer   Hypokalemia   NSVT (nonsustained ventricular tachycardia) ([place])   Diarrhea   NSTEMI (non-ST elevated myocardial infarction) ([place])   Acute deep vein thrombosis (DVT) of left upper extremity ([place])   Acute systolic CHF (congestive heart failure) (HCC)   Anemia of chronic disease   Acute respiratory failure with hypoxemia (HCC)   Abdominal fluid collection   Abscess  1 septic shock secondary to peritonitis secondary to perforated duodenal ulcer Status post expiratory laparotomy, omental patch repair of duodenal ulcer per Dr. 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  Currently afebrile.  Normal white count.  Patient noted to have upper GI series 10/28/2019 showing persistent small leak with repeat upper GI series done on 11/03/2018 one of the duodenal bulb likely related to prior surgical repair.  No definite persistent leak identified on today's examination.  Patient noted to have fluid collections 10/21/2019 with drain placement with cultures positive for Candida.  Status post drain placement.  On IV fluconazole.  Patient noted to have a T-max of 102.1 on 7/1/ 2021 with a change in antibiotics from IV vancomycin(s/p 5 days) to IV Merrem.  Blood cultures obtained with no growth to date.  Currently afebrile with a normal white count.  Dr. [name] discussed with general surgery, Dr. [name] on 10/30/2019 and was felt that enlarging hematoma was not source of infection as it would drain into the midline wound.  Continue IV PPI.  Patient s/p 8 days of IV Merrem. Loose stools have  decreased.  C. difficile PCR was negative.  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   Right upper quadrant drain intact.  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  Drain catheter left in place.  Patient was being followed by general surgery and it was noted that patient was being transitioned to comfort/hospice care and as such patient has been allowed to eat and drink for comfort.  General surgery discussed with family and they understood that this will further cause his fistula to not heal but given the desire for transition to comfort it was decided a better way to make patient comfortable.  General surgery has signed off as of 11/16/2019.  Patient was on IV fluconazole which has subsequently been discontinued.  Patient being transitioned to comfort measures however family per palliative care have decided they no longer want comfort measures and would like to try therapy.  Patient tolerating diet which we will continue for now.  NG tube discontinued.  Patient has been reassessed by IR and patient for repeat fistulogram today. Patient also need to follow-up with general surgery closely on discharge.  CIR to evaluate if patient is an appropriate candidate.  Social work consulted.  Palliative care following.   2.  Hypotension/hemorrhagic shock secondary to large retroperitoneal Patient noted to be hypotensive 2/83/6629 with systolic blood pressures in the 70s requiring transfer to the ICU and pressors of Levophed.  CBC done noted a hemoglobin of 5.9.  CT abdomen and pelvis done consistent with a large retroperitoneal hematoma with evidence of active bleed within the right psoas muscle.  Persistent but decreased size of right rectus muscle hematoma.  Patient was transferred to the ICU and transfused 2 units of FFP, 4 units total of packed red blood cells on 11/08/2019, 11/09/2019.  Levophed subsequently titrated off.  Hemoglobin currently at 8.9 after 4  units of packed red blood cells.  IV heparin has been discontinued and patient no longer a candidate for anticoagulation.  Clonidine patch discontinued.  Nitroglycerin paste discontinued.  IV Lopressor dose decreased and patient will be transition to oral Lopressor.  PCCM consulted who were following however have signed off.  Patient was transitioned to comfort measures with palliative care following however family have decided per palliative care that they no longer want comfort measures and want to try therapy as patient strength seems to be slowly improving as well as his oral intake..  Consult with social work. Palliative care following.  3.  Acute hypoxic respiratory failure Status post extubation 10/15/2019.  NG tube has been discontinued.  Patient with sats of 94% on room air.    4.  Acute systolic heart failure/non-STEMI/coronary artery disease 2000/NSVT Euvolemic.  Blood pressure initially was stable however patient noted to be hypotensive on 4/76/5465 with systolic blood pressures in the 70s.  Clonidine patch discontinued.  IV beta-blocker initially decreased to 5 mg every 4 hours.  Early on in the hospitalization patient noted to have a 5-6 beat run of nonsustained V. tach however asymptomatic on 11/04/2019.  Potassium at 4.1.  Magnesium at 2.2.  Phosphorus at 4.  IV beta-blocker dose had been increased to 10 mg every 4 hours per cardiology however due to hemorrhagic shock  IV Lopressor has been decreased.  Patient was on IV Lopressor 7.5 mg every 8 hours and has been subsequently transition to oral Lopressor as recommended per cardiology early on during the hospitalization.  Patient was transitioned to comfort measures with palliative care following however family have decided they no longer want to focus on comfort measures and would like to try therapy as patient strength seems to be slowly improving as well as oral intake.Marland Kitchen  Keep magnesium > 2.  Keep potassium > 4.    5.  Left upper extremity DVT  cephalic/axillary vein Likely secondary to PICC line.  IV heparin discontinued 11/08/2019 due to hemorrhagic shock secondary to large retroperitoneal hematoma.    6.  Right upper extremity swelling and pain and discomfort Heparin initially discontinued on 10/29/2019 after enlarging hematoma.  Heparin was resumed without a bolus on 10/31/2019 after Dr. [name] discussed with a specialist.  Right upper extremity discomfort initially felt to be secondary to a DVT however Dopplers negative.  Patient now with large retroperitoneal hematoma with hemorrhagic shock on 11/08/2019 and as such heparin discontinued.  Patient NOT a candidate for anticoagulation at this time.  Continue supportive care.  Patient was being transitioned to comfort measures with palliative care following however family have decided they want to try therapy at this time as patient seem to be slowly improving, improvement with oral intake and his strength...  Continue supportive care at this time.    7.  Well-controlled diabetes mellitus type 2 Hemoglobin A1c 6.6.  Patient noted not to be a diabetic prior to admission and elevated CBGs likely was secondary to TPN.  Patient was transitioned to comfort measures.  TPN discontinued..Sliding scale insulin discontinued.  Tolerating current diet.  Follow.   8.  Anemia of critical illness/acute blood loss anemia Status post transfusion of 2 units packed red blood cells (10/25/2019).  Patient s/p hemorrhagic shock on 11/08/2019 secondary to large retroperitoneal hematoma.  Status post transfusion of 2 units packed red blood cells 11/08/2019 and 2 units of FFP.  Hemoglobin was at 6.7 on 11/09/2019 and patient transfused another 2 units of packed red blood cells hemoglobin currently at 9.3(11/21/2019). Transfusion threshold hemoglobin  <7.  Patient noted today to have some rectal bleeding with some blood clots per RN.  Dulcolax suppository held.  Repeat H&H.   Palliative care following and patient was initially  transitioned to comfort measures however family still trying to explore all options and no longer want comfort measures and would like to try therapy..  Follow.   9.  Malignancy of the prostate Outpatient follow-up.  Patient was being transitioned to comfort measures with palliative care following however family have decided by palliative care they no longer want to focus on comfort and would like to try therapy as patient strength seems to be improving, as well as oral intake..    10.  Tobacco abuse Tobacco cessation.  11.  Diarrhea Per RN patient with voluminous loose stools noted in rectal pouch a about 1 to 2 weeks ago which has since improved.  C. difficile PCR negative.  KUB negative.  Patient status post full course of IV antibiotics.  Patient being followed by palliative care and focus was on comfort measures.  Per palliative care family decided on 11/22/2019, that no longer wanting comfort measures and willing to try therapy. Follow.  12.  Constipation Patient given Relistor, Dulcolax suppository x1 given, some prune juice, and mag citrate as tolerated on 11/19/2019.  Patient with good bowel movement  on 11/19/2019.  Patient with complaints of constipation on 11/21/2019, patient given a Dulcolax suppository, magnesium citrate with good results.  Continue current bowel regimen.  Hold Dulcolax suppository today as patient noted to have some rectal bleeding with clots.  Not on anticoagulation  13.  Lower extremity pain Secondary to retroperitoneal hematoma.  Improved clinically after transfusion of packed red blood cells, FFP as well as pain management.   Continue Lidoderm patch, IV Robaxin as needed.  Patient on Dilaudid drip which is being weaned off..  Supportive care.  Palliative care following.  14.  Rectal bleed Patient noted to have some clots of blood when RN was about to place Dulcolax suppository.  Check H&H.  Monitor.  Hold Dulcolax suppository today.  Follow.  15.  Prognosis Patient  with multiple comorbidities, complicated course during this hospitalization.  Patient admitted with sepsis secondary to peritonitis, duodenal ulcer with perforation status post repair, complicated by non-STEMI, acute CHF.  Patient with ongoing fistula was on TPN. which is subsequently been discontinued, palliative care following and decision was to transition to comfort measures and as such patient has been placed on a diet for comfort, general surgery signed off, palliative care following to determine disposition of residential hospice versus hospice home.  However daughter called palliative care and initially had wanted patient transferred to [facility].  Palliative care following and per palliative care note family would like to consider the options again as patient's strength seems to be slowly improving.  By palliative care family have decided they no longer want comfort measures in want to try  therapy.  Palliative following and appreciate input and recommendations.  CIR evaluation pending.  Social work consulted for SNF placement.    DVT prophylaxis: SCDs Code Status: DNR Family Communication: Updated patient and daughter at bedside. Disposition:   Status is: Inpatient    Dispo:  Patient From:  Home  Planned Disposition:  To be determined.  Likely SNF with palliative care following versus CIR  Expected discharge date:  To be determined.  Medically stable for discharge:   Patient currently being followed by palliative care and was transitioned to comfort measures.  Per palliative family have decided they no longer want palliative care and would like to try therapy as patient seems to be slowly improving.  CIR evaluation pending.  Social work consulted for SNF.        Consultants:   Palliative care: Dr. Rowe Pavy 10/24/2019  Cardiology: Dr. Margaretann Loveless 10/13/2019  General surgery: Dr. [name] 10/12/2019  Gastroenterology: Dr. Therisa Doyne 10/12/2019  PCCM: Dr. Halford Chessman 10/12/2019  Procedures:  CT  abdomen and pelvis 10/21/2019, 10/29/2019  CT angiogram abdomen and pelvis 10/11/2019  Upper GI series 10/19/2019, 10/28/2019, 11/03/2019  2D echo 10/13/2019, 10/16/2019  Right upper extremity Doppler 11/02/2019  Left upper extremity Dopplers 10/17/2019  CT-guided drain placement right upper quadrant fluid collection per interventional radiology, Dr. Earleen Newport 10/22/2019  Exploratory laparotomy, omental patch repair of duodenal ulcer per Dr. [name] 10/12/2019  Right IJ CVL 10/12/2019>>>> 10/13/2019  Right radial A-line 10/12/2019>>>> 10/15/2019  ETT 10/12/2019>>>> 10/15/2019  Left upper extremity PICC 10/14/2019  Fistulogram 11/06/2019, 10/30/2019  Transfusion 2 units packed red blood cells 10/25/2019  Transfusion of 2 units packed red blood cells 11/08/2019  Transfusion of 2 units FFP 11/08/2019  Transfuse 2 units packed red blood cells 11/09/2019  Significant Hospital Events   6/14 Admit, exploratory laparotomy, omental patch repair of duodenal ulcer 6/16 Off vasopressors, less drainage from abd drain / clearing  6/17 extubated, weak cough,  added high flow oxygen 6/18 start heparin gtt 6/20 d/c amiodarone 6/23 new fluid collection on CT abd > Drained by IR 6/24 To [place] 6/25 PCCM sign off 7/01 back to ICU with fever, tachypnea >> PCCM asked to reassess 7/2comfortable this morning 11/08/2019 patient noted to go into hemorrhagic shock got transferred to the ICU placed on a Levophed drip which was subsequently weaned off.  Transfused 2 units packed red blood cells, 2 units FFP and CT abdomen the pelvis which was done concerning for large retroperitoneal hematoma.     Significant Diagnostic Tests:   CT angio abd/pelvis 6/13 >> mild atherosclerosis, no significant arterial stenosis of mesenteric vasculature, no findings of bowel ischemia, colonic diverticulosis w/o diverticulitis, b/l renal cysts, 12 mm lesion upper pole Rt kidney  ECHO 6/15 >> poor image quality, appears to be regional wall  motion variability but confident wall motion analysis is impossible even after contrast administration, LVEF ~ 12-75%, Grade I diastolic dysfunction, RV systolic function is moderately reduced, RV is mildly enlarged  6/19 venous doppler LUE>>acute deep vein thrombosis involving the left axillary vein, surrounding the PICC line. Findings consistent with acute superficial vein thrombosis involving the left cephalic vein.  UGI 6/21 >> small leak from duodenum  CT ABD/Pelvis 6/23 >>interval midline abdominal incision with incomplete skin surgical closure, probable small hematomas within the anterior abdominal wall and the omentum. Nonspecific 4.3 cm air-fluid collection in the RUQ of the abdomen between the duodenal bulb and gallbladder, not containing enteric contrast. No residual leak from the duodenal bulb identified. New small bilateral pleural effusions with dependent L>R lower lobe opacities  Upper GI series 6/30 >>Small puckered stump of a leak at the site of the prior elongatedeak. Findings suggest resolving but persistent extraluminalcollection.  CT abd/pelvis 7/01 >>Anterior midline surgical incision is again noted. Large irregular high density is noted in this area most consistent with enlarging subcutaneous or intramuscular hematoma. Interval placement of pigtail drainage catheter in the area of fluid collection noted on prior exam in the right upper quadrant. Fluid collection is significantly decompressed compared to prior exam. Micro Data:  SARS CoV2 6/13 >> negative RUQ fluid collection 6/24 >> moderate Candida albicans Blood 6/25 >>negative Blood culture 10/29/2019>>> negative     Antimicrobials:   IV Merrem 10/29/2019>>>>>> 11/05/2019  IV Zosyn 10/12/2019>>>> 10/29/2019  IV vancomycin 10/29/2019>>>> 11/03/2019  IV fluconazole 10/25/2019>>>> 11/19/2019   Subjective: Patient laying in bed.  Patient denies any chest pain or shortness of breath.  Denies any abdominal pain.   Daughter at bedside.  Per RN patient with some clots of blood per rectum this morning and some mucus noted.  Patient awaiting fistulogram today.   Objective: Vitals:   11/23/19 1024 11/23/19 2125 11/24/19 0556 11/24/19 1026  BP: (!) 120/63 123/72 (!) 134/73 (!) 122/60  Pulse: 90 96 77 85  Resp:  19 17   Temp:  98 F (36.7 C) 98 F (36.7 C)   TempSrc:  Oral    SpO2:  95% 98%   Weight:      Height:        Intake/Output Summary (Last 24 hours) at 11/24/2019 1137 Last data filed at 11/24/2019 0600 Gross per 24 hour  Intake 271.61 ml  Output 0 ml  Net 271.61 ml   Filed Weights   11/15/19 0444 11/16/19 0500 11/17/19 0852  Weight: 95.5 kg 94.5 kg 98.9 kg    Examination:  General exam: NAD Respiratory system: Lungs clear to auscultation anterior lung fields.  No  wheezes, no crackles, no rhonchi.  Normal respiratory effort.  Cardiovascular system: RRR no murmurs rubs or gallops.  No JVD.  No lower extremity edema. Gastrointestinal system: Abdomen is soft, nondistended, positive bowel sounds, nontender to palpation.  Bandage on midline wound.  Right upper quadrant drain with some scant drainage. Central nervous system: Alert.  Moving extremities spontaneously.  No focal neurological deficits.  Follow.   Extremities: Symmetric 5 x 5 power. Skin: No rashes, lesions or ulcers Psychiatry: Judgement and insight appear fair. Mood & affect appropriate.     Data Reviewed: I have personally reviewed following labs and imaging studies  CBC: Recent Labs  Lab 11/18/19 1245 11/21/19 0610  WBC 10.0  --   NEUTROABS 7.7  --   HGB 8.9* 9.3*  HCT 28.8* 29.5*  MCV 90.0  --   PLT 356  --     Basic Metabolic Panel: Recent Labs  Lab 11/18/19 1245 11/21/19 0610  NA 139 134*  K 4.1 3.6  CL 105 103  CO2 23 21*  GLUCOSE 123* 109*  BUN 29* 20  CREATININE 0.74 0.64  CALCIUM 8.2* 8.1*    GFR: Estimated Creatinine Clearance: 82.3 mL/min (by C-G formula based on SCr of 0.64  mg/dL).  Liver Function Tests: Recent Labs  Lab 11/18/19 1245  AST 63*  ALT 78*  ALKPHOS 78  BILITOT 1.5*  PROT 6.7  ALBUMIN 1.9*    CBG: No results for input(s): GLUCAP in the last 168 hours.   No results found for this or any previous visit (from the past 240 hour(s)).       Radiology Studies: No results found.      Scheduled Meds: . acetaminophen  650 mg Oral TID  . bisacodyl  10 mg Rectal Daily  . chlorhexidine  15 mL Mouth Rinse BID  . Chlorhexidine Gluconate Cloth  6 each Topical Daily  . feeding supplement (PRO-STAT SUGAR FREE 64)  30 mL Oral BID  . lidocaine  2 patch Transdermal Q24H  . metoprolol tartrate  50 mg Oral BID  . pantoprazole  40 mg Oral QHS  . senna-docusate  1 tablet Oral QHS  . simethicone  160 mg Oral QID  . sodium chloride flush  10-40 mL Intracatheter Q12H  . sodium chloride flush  5 mL Intracatheter Q8H   Continuous Infusions: . sodium chloride 250 mL (11/22/19 0004)  . HYDROmorphone 0.1 mg/hr (11/24/19 0140)  . methocarbamol (ROBAXIN) IV 500 mg (11/17/19 0119)     LOS: 43 days    Time spent: 35 minutes    Irine Seal, MD [facility]   To contact the attending provider between 7A-7P or the covering provider during after hours 7P-7A, please log into the web site [email or web address] and access using universal [place] password for that web site. If you do not have the password, please call the hospital operator.  11/24/2019, 11:37 AM

## 2019-11-24 NOTE — Progress Notes (Signed)
Inpatient Rehabilitation-Admissions Coordinator   CIR consult received. Noted change in therapy recommendations from SNF to CIR as of 7/26. Will follow for 1 to 2 more sessions to monitor progress. Note that patient has not seen OT since 7/16 and if we were to pursue CIR, we would need updated notes for insurance authorization.   Raechel Ache, OTR/L  Rehab Admissions Coordinator  [phone] 11/24/2019 4:25 PM

## 2019-11-24 NOTE — Care Management Important Message (Signed)
Important Message  Patient Details IM Letter to presented to the Patient Name: Matthew Schwartz MRN: [id number] Date of Birth: 1932/09/21   Medicare Important Message Given:  Yes     [name] 11/24/2019, 4:04 PM

## 2019-11-24 NOTE — Progress Notes (Signed)
Patient ID: Matthew Schwartz, male   DOB: 10/28/1932, 86 y.o.   MRN: [id number] Due to longer than expected duration of OP IR cases today, drain injection on Matthew Schwartz has been rescheduled for 7/28. Nurse updated.

## 2019-11-25 ENCOUNTER — Inpatient Hospital Stay (HOSPITAL_COMMUNITY): Payer: Medicare Other

## 2019-11-25 HISTORY — PX: IR SINUS/FIST TUBE CHK-NON GI: IMG673

## 2019-11-25 LAB — BASIC METABOLIC PANEL
Anion gap: 5 (ref 5–15)
BUN: 13 mg/dL (ref 8–23)
CO2: 24 mmol/L (ref 22–32)
Calcium: 6.9 mg/dL — ABNORMAL LOW (ref 8.9–10.3)
Chloride: 107 mmol/L (ref 98–111)
Creatinine, Ser: 0.37 mg/dL — ABNORMAL LOW (ref 0.61–1.24)
GFR calc Af Amer: >60 mL/min (ref >60)
GFR calc non Af Amer: >60 mL/min (ref >60)
Glucose, Bld: 120 mg/dL — ABNORMAL HIGH (ref 70–99)
Potassium: 3.2 mmol/L — ABNORMAL LOW (ref 3.5–5.1)
Sodium: 136 mmol/L (ref 135–145)

## 2019-11-25 LAB — HEMOGLOBIN AND HEMATOCRIT, BLOOD
HCT: 25.8 % — ABNORMAL LOW (ref 39.0–52.0)
Hemoglobin: 8.1 g/dL — ABNORMAL LOW (ref 13.0–17.0)

## 2019-11-25 MED ORDER — IOHEXOL 300 MG/ML  SOLN
50.0000 mL | Freq: Once | INTRAMUSCULAR | Status: AC | PRN
  Administered 2019-11-25: 5 mL

## 2019-11-25 MED ORDER — MELATONIN 5 MG PO TABS
10.0000 mg | ORAL_TABLET | Freq: Every day | ORAL | Status: DC
  Administered 2019-11-25: 10 mg
  Filled 2019-11-25 (×2): qty 2

## 2019-11-25 MED ORDER — FENTANYL 25 MCG/HR TD PT72
1.0000 | MEDICATED_PATCH | TRANSDERMAL | Status: DC
  Administered 2019-11-25 – 2019-12-01 (×3): 1 via TRANSDERMAL
  Filled 2019-11-25 (×3): qty 1

## 2019-11-25 MED ORDER — HYDROMORPHONE HCL 1 MG/ML IJ SOLN
0.5000 mg | INTRAMUSCULAR | Status: DC | PRN
  Administered 2019-11-25 – 2019-11-29 (×15): 0.5 mg via INTRAVENOUS
  Filled 2019-11-25 (×16): qty 0.5

## 2019-11-25 NOTE — Progress Notes (Signed)
Physical Therapy Treatment Patient Details Name: Matthew Schwartz MRN: [id number] DOB: Dec 14, 1932 Today's Date: 11/25/2019    History of Present Illness Pt s/p exploratory lap (10/12/19) to repair perforated duodenal ulcer and then on vent with extubation 10/14/19.  Pt with acute respiratory failure with hypoxia and acute systolic CHF in setting of sepsis and peritonitis.  Pt with hx of DM, prostate CA and bil TKR    PT Comments    Pt with some difficulty following commands, requiring increased time and cues to perform rolling in supine and for scooting back in chair. Pt fatigued and with pain requiring increased time with mobility and max A x2 to roll for maximove pad positioning. Pt with strong posterior lean in chair, requiring max A to total A to flex trunk forward due to weak core and significant assist to maintain upright sitting while therapists scooted pt back into chair. Pt with limited bil knee flexion, R>L limiting ability to assist with rolling and scooting back in chair. Pt requesting to sit up at bedside and limited insight of current functional level despite constant education throughout transfer process. Attempted exercises once in chair, but due to increased nausea had to stop; RN notified. Pt will benefit from continued physical therapy in hospital and recommendations below to increase strength, balance, endurance for safe ADLs and gait.   Follow Up Recommendations  SNF     Equipment Recommendations  None recommended by PT    Recommendations for Other Services       Precautions / Restrictions Precautions Precautions: Fall Precaution Comments: 1 right drain--abd, may have loose BM Restrictions Weight Bearing Restrictions: No    Mobility  Bed Mobility Overal bed mobility: Needs Assistance Bed Mobility: Rolling Rolling: Max assist;+2 for physical assistance  General bed mobility comments: multimodal cues used to initiate assist from patient, very weak with limited ability to  utilize UEs to assist with rolling in bed; therapists assisting with bed pad  Transfers Overall transfer level: Needs assistance  General transfer comment: maximove to chair  Ambulation/Gait        Stairs             Wheelchair Mobility    Modified Rankin (Stroke Patients Only)       Balance Overall balance assessment: Needs assistance Sitting-balance support: Bilateral upper extremity supported;Feet supported Sitting balance-Leahy Scale: Zero Sitting balance - Comments: strong posterior lean in recliner requiring max to total assistance to sit forward and maintain forward position Postural control: Posterior lean           Cognition Arousal/Alertness: Awake/alert Behavior During Therapy: Flat affect Overall Cognitive Status: Difficult to assess  General Comments: slow to initiate, unsure if language barrier or HOH; follows commands with repeated cues      Exercises General Exercises - Lower Extremity Ankle Circles/Pumps: AROM;Both;15 reps    General Comments        Pertinent Vitals/Pain Pain Assessment: 0-10 Pain Score: 5  Pain Location: "everywhere" Pain Descriptors / Indicators: Grimacing;Discomfort Pain Intervention(s): Limited activity within patient's tolerance;Monitored during session    Home Living                      Prior Function            PT Goals (current goals can now be found in the care plan section) Acute Rehab PT Goals Patient Stated Goal: pt requesting to sit up per DTR and nursing PT Goal Formulation: With patient/family Time For Goal Achievement: 12/02/19 Potential  to Achieve Goals: Fair Progress towards PT goals: Progressing toward goals (limited progress)    Frequency    Min 2X/week      PT Plan Current plan remains appropriate    Co-evaluation PT/OT/SLP Co-Evaluation/Treatment: Yes Reason for Co-Treatment: For patient/therapist safety;To address functional/ADL transfers PT goals addressed during  session: Mobility/safety with mobility OT goals addressed during session: ADL's and self-care      AM-PAC PT "6 Clicks" Mobility   Outcome Measure  Help needed turning from your back to your side while in a flat bed without using bedrails?: Total Help needed moving from lying on your back to sitting on the side of a flat bed without using bedrails?: Total Help needed moving to and from a bed to a chair (including a wheelchair)?: Total Help needed standing up from a chair using your arms (e.g., wheelchair or bedside chair)?: Total Help needed to walk in hospital room?: Total Help needed climbing 3-5 steps with a railing? : Total 6 Click Score: 6    End of Session   Activity Tolerance: Patient limited by fatigue;Other (comment) (limited by nausea) Patient left: with call bell/phone within reach;with family/visitor present;in chair;with chair alarm set Nurse Communication: Mobility status;Need for lift equipment PT Visit Diagnosis: Muscle weakness (generalized) (M62.81);Other abnormalities of gait and mobility (R26.89)     Time: 8891-6945 PT Time Calculation (min) (ACUTE ONLY): 38 min  Charges:  $Therapeutic Exercise: 8-22 mins $Therapeutic Activity: 8-22 mins                      Tori Millan Legan PT, DPT 11/25/19, 12:28 PM

## 2019-11-25 NOTE — NC FL2 (Signed)
[place] LEVEL OF CARE SCREENING TOOL     IDENTIFICATION  Patient Name: Matthew Schwartz Birthdate: [birth year] Sex: male Admission Date (Current Location): 10/12/2019  [facility] [place] [facility] [place] Number:  Herbalist and Address:  [facility],  [place] [address], [place]      Provider Number: [id number]  Attending Physician Name and Address:  Karie Kirks, DO  Relative Name and Phone Number:       Current Level of Care: Hospital Recommended Level of Care: [place] Prior Approval Number:    Date Approved/Denied:   PASRR Number: [id number]  Discharge Plan: SNF    Current Diagnoses: Patient Active Problem List   Diagnosis Date Noted  . Abscess   . Nontraumatic retroperitoneal hematoma: 11/08/2019 11/09/2019  . Hemorrhagic shock (HCC):11/08/2019 11/09/2019  . Acute respiratory failure with hypoxemia ([place])   . Abdominal fluid collection   . Anemia of chronic disease   . Hypotension   . Gastric ulcer   . Hypokalemia   . NSVT (nonsustained ventricular tachycardia) ([place])   . Diarrhea   . Severe sepsis with septic shock ([place])   . NSTEMI (non-ST elevated myocardial infarction) ([place])   . Acute deep vein thrombosis (DVT) of left upper extremity ([place])   . Acute systolic CHF (congestive heart failure) ([place])   . Peritonitis ([place])   . Epigastric pain   . Palliative care by specialist   . Goals of care, counseling/discussion   . General weakness   . Acute abdominal pain   . Bilateral atelectasis 10/17/2019  . Acute respiratory insufficiency   . Elevated troponin level   . Acute respiratory failure with hypoxia ([place])   . PVCs (premature ventricular contractions)   . Obstipation 10/12/2019  . HLD (hyperlipidemia) 10/12/2019  . Diabetes mellitus type 2, uncontrolled, with complications ([place]) 87/86/7672  . Perforated bowel ([place]) 10/12/2019  . Perforated duodenal ulcer ([place]) 10/12/2019  . Anemia 09/07/2016  . Coronary artery  disease due to lipid rich plaque 02/16/2015  . Hyperlipidemia 02/16/2015  . Malignant neoplasm of prostate ([place]) 02/18/2014  . HTN (hypertension) 06/14/2013  . Dyslipidemia 06/14/2013  . GERD (gastroesophageal reflux disease) 06/14/2013    Orientation RESPIRATION BLADDER Height & Weight     Self, Time, Situation, Place  Normal Continent Weight: 98.9 kg Height:  5\' 11"  (180.3 cm)  BEHAVIORAL SYMPTOMS/MOOD NEUROLOGICAL BOWEL NUTRITION STATUS      Continent Diet (Regular)  AMBULATORY STATUS COMMUNICATION OF NEEDS Skin   Extensive Assist Verbally Other (Comment) (Moisture associated skin damage to perirectal area. Barrier cream to rectum twice daily.)                       Personal Care Assistance Level of Assistance  Bathing, Dressing Bathing Assistance: Maximum assistance   Dressing Assistance: Maximum assistance     Functional Limitations Info  Sight Sight Info: Impaired        SPECIAL CARE FACTORS FREQUENCY  PT (By licensed PT), OT (By licensed OT)     PT Frequency: 5 x weekly OT Frequency: 5 x weekly            Contractures Contractures Info: Not present    Additional Factors Info  Code Status, Allergies Code Status Info: DNR Allergies Info: Losartan Potassium           Current Medications (11/25/2019):  This is the current hospital active medication list Current Facility-Administered Medications  Medication Dose Route Frequency Provider Last Rate Last  Admin  . 0.9 %  sodium chloride infusion   Intravenous PRN Chesley Mires, MD 20 mL/hr at 11/22/19 0004 250 mL at 11/22/19 0004  . acetaminophen (TYLENOL) tablet 650 mg  650 mg Oral TID Lane Hacker L, DO   650 mg at 11/25/19 1024  . bisacodyl (DULCOLAX) EC tablet 5 mg  5 mg Oral Daily PRN Lane Hacker L, DO      . bisacodyl (DULCOLAX) suppository 10 mg  10 mg Rectal Daily Eugenie Filler, MD   10 mg at 11/21/19 1807  . chlorhexidine (PERIDEX) 0.12 % solution 15 mL  15 mL Mouth Rinse BID  Nita Sells, MD   15 mL at 11/25/19 1024  . Chlorhexidine Gluconate Cloth 2 % PADS 6 each  6 each Topical Daily Chesley Mires, MD   6 each at 11/25/19 1024  . feeding supplement (PRO-STAT SUGAR FREE 64) liquid 30 mL  30 mL Oral BID Eugenie Filler, MD   30 mL at 11/25/19 1024  . fentaNYL (DURAGESIC) 25 MCG/HR 1 patch  1 patch Transdermal Q72H Lane Hacker L, DO   1 patch at 11/25/19 1036  . glycopyrrolate (ROBINUL) injection 0.4 mg  0.4 mg Intravenous Q4H PRN Lane Hacker L, DO      . HYDROmorphone (DILAUDID) injection 0.5 mg  0.5 mg Intravenous Q2H PRN Lane Hacker L, DO   0.5 mg at 11/25/19 1110  . ipratropium-albuterol (DUONEB) 0.5-2.5 (3) MG/3ML nebulizer solution 3 mL  3 mL Nebulization Q2H PRN Swayze, Ava, DO      . lidocaine (LIDODERM) 5 % 2 patch  2 patch Transdermal Q24H Eugenie Filler, MD   2 patch at 11/24/19 2044  . lip balm (CARMEX) ointment   Topical PRN Tanda Rockers, MD   Given at 11/09/19 1501  . LORazepam (ATIVAN) 2 MG/ML concentrated solution 1 mg  1 mg Oral QHS Lane Hacker L, DO      . LORazepam (ATIVAN) injection 0.5 mg  0.5 mg Intravenous Q6H PRN Olalere, Adewale A, MD   0.5 mg at 11/25/19 0253  . melatonin tablet 10 mg  10 mg Per Tube QHS Lane Hacker L, DO      . methocarbamol (ROBAXIN) 500 mg in dextrose 5 % 50 mL IVPB  500 mg Intravenous Q8H PRN Eugenie Filler, MD 100 mL/hr at 11/17/19 0119 500 mg at 11/17/19 0119  . metoprolol tartrate (LOPRESSOR) tablet 50 mg  50 mg Oral BID Eugenie Filler, MD   50 mg at 11/25/19 1029  . ondansetron (ZOFRAN) injection 4 mg  4 mg Intravenous Q6H PRN Allie Bossier, MD   4 mg at 11/25/19 1024  . pantoprazole (PROTONIX) EC tablet 40 mg  40 mg Oral QHS Eudelia Bunch, RPH   40 mg at 11/24/19 2100  . prochlorperazine (COMPAZINE) injection 10 mg  10 mg Intravenous Q6H PRN Eugenie Filler, MD   10 mg at 11/20/19 1804  . senna-docusate (Senokot-S) tablet 1 tablet  1 tablet Oral QHS  Eugenie Filler, MD   1 tablet at 11/24/19 2101  . silver nitrate applicators applicator 1 application  1 application Topical Daily PRN [name], MD      . simethicone [facility]) chewable tablet 160 mg  160 mg Oral QID Swayze, Ava, DO   160 mg at 11/25/19 1024  . sodium chloride flush (NS) 0.9 % injection 10-40 mL  10-40 mL Intracatheter Q12H Chesley Mires, MD   10 mL at 11/25/19 1102  .  sodium chloride flush (NS) 0.9 % injection 10-40 mL  10-40 mL Intracatheter PRN Chesley Mires, MD   10 mL at 11/10/19 1726  . sodium chloride flush (NS) 0.9 % injection 5 mL  5 mL Intracatheter Q8H Corrie Mckusick, DO   5 mL at 11/24/19 2102     Discharge Medications: Please see discharge summary for a list of discharge medications.  Relevant Imaging Results:  Relevant Lab Results:   Additional Information ss# [id number]  Lavone Barrientes, Marjie Skiff, RN

## 2019-11-25 NOTE — Progress Notes (Signed)
[facility] Inpatient Rehab Admissions:  I met with patient and his daughter at the bedside this am.  I explained inpatient rehab and gave daughter my card.  Both patient and daughter would like CIR.  Noted OT now recommending SNF placement.  I explained to family that I need to see patient out of bed, able to sit up in chair an hour and able to stand and bear weight.  Noted patient needed total A today with OT.  Will await PT updated notes.  Doubt that I could get insurance authorization with current functional levels.  I will follow along for progress.  Call me for questions.  [phone]

## 2019-11-25 NOTE — Progress Notes (Addendum)
Dilaudid drip was discontinued. 34ml (14mg ) was wasted by this nurse and Jayden RN.   11/25/2019 12:07pm- verified waste of 65ml(14mg ): Sheppard Penton, RN BSN

## 2019-11-25 NOTE — Progress Notes (Signed)
PROGRESS NOTE  Matthew Schwartz EYC:[id number] DOB: December 22, 1932 DOA: 10/12/2019 PCP: [name], MD  Brief History   [name] NIDDM, HLD, GERD, irritable bowel syndrome (constipation) status post left knee replacement 2009, PTCA 2000-->RCA well as prior prostate CA, former smoker Admit 10/12/2019 severe abdominal pain-ex lap 10/12/2019-perforated duodenal ulcer and remained on vent NSVT postop-echo showed Systolic HF--was on pressors and cardiology consulted-eventually extubated 6/17 + fevers 6/23= postoperative abscess-IR Placed abd perc drain 6/24--growing Candida /on diflucan On 6/30 upper GI series were performed-and repeated Unfortunately spiked a fever without cause-found to have on CT 7/1 enlarging hematoma General surgery felt this was minimal and not the cause for his fever He has not had high-grade fever since his Antibiotics were adjusted 7/1 vancomycin and Primaxin but continues to have low-grade Heparin was held temporarily until 7/3 and restarted cautiously 7/3 after risk-benefit discussion with family  He has stabilized to some degree on stepdown unit he has had some pain and swelling in his upper extremities likely from the prior DVT in his left upper extremity-his right arm was imaged for DVT and found to be negative He has a long road ahead and TOC is working with patient and family to figure out next steps in terms of placement he may be able to eventually go to an LTAC.  On the morning of 11/25/2019 the patient went for follow-up fistulogram. 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 That drain will currently left in place as it is unclear what the exact amount of current daily fluid output is. Once the output is minimal, the percutaneous drainage catheter will be removed and no further drain injection of CT is necessary  unless there is a clinical change warranting re-imaging.  Consultants  . Wound Care . Interventional Radiology . Palliative Care . Cardiology . PCCM . General Surgery . Gastroenterology . Urology  Procedures   CT abdomen and pelvis 10/21/2019, 10/29/2019  CT angiogram abdomen and pelvis 10/11/2019  Upper GI series 10/19/2019, 10/28/2019, 11/03/2019  2D echo 10/13/2019, 10/16/2019  Right upper extremity Doppler 11/02/2019  Left upper extremity Dopplers 10/17/2019  CT-guided drain placement right upper quadrant fluid collection per interventional radiology, Dr. Earleen Newport 10/22/2019  Exploratory laparotomy, omental patch repair of duodenal ulcer per Dr. [name] 10/12/2019  Right IJ CVL 10/12/2019>>>> 10/13/2019  Right radial A-line 10/12/2019>>>> 10/15/2019  ETT 10/12/2019>>>> 10/15/2019  Left upper extremity PICC 10/14/2019  Fistulogram 11/06/2019, 10/30/2019, 11/25/2019  Transfusion 2 units packed red blood cells 10/25/2019  Transfusion of 2 units packed red blood cells 11/08/2019  Transfusion of 2 units FFP 11/08/2019  Transfuse 2 units packed red blood cells 11/09/2019  Antibiotics   Anti-infectives (From admission, onward)   Start     Dose/Rate Route Frequency Ordered Stop   11/13/19 2200  fluconazole (DIFLUCAN) IVPB 400 mg  Status:  Discontinued        400 mg 100 mL/hr over 120 Minutes Intravenous Every 24 hours 11/13/19 1917 11/19/19 0925   11/06/19 1800  fluconazole (DIFLUCAN) IVPB 400 mg  Status:  Discontinued        400 mg 100 mL/hr over 120 Minutes Intravenous Every 24 hours 11/05/19 1850 11/13/19 1835   10/31/19 2200  vancomycin (VANCOREADY) IVPB 1250 mg/250 mL  Status:  Discontinued        1,250 mg 166.7 mL/hr over 90 Minutes Intravenous Every 12 hours 10/31/19 1602 11/03/19 1152  10/29/19 2200  meropenem (MERREM) 1 g in sodium chloride 0.9 % 100 mL IVPB        1 g 200 mL/hr over 30 Minutes Intravenous Every 8 hours 10/29/19 2008 11/05/19 2207   10/29/19 2200  vancomycin  (VANCOCIN) IVPB 1000 mg/200 mL premix  Status:  Discontinued        1,000 mg 200 mL/hr over 60 Minutes Intravenous Every 12 hours 10/29/19 2008 10/31/19 1602   10/25/19 1800  fluconazole (DIFLUCAN) IVPB 400 mg  Status:  Discontinued        400 mg 100 mL/hr over 120 Minutes Intravenous Every 24 hours 10/24/19 1750 11/05/19 1850   10/24/19 1830  fluconazole (DIFLUCAN) IVPB 800 mg  Status:  Discontinued        800 mg 200 mL/hr over 120 Minutes Intravenous  Once 10/24/19 1750 10/24/19 1755   10/24/19 1830  fluconazole (DIFLUCAN) IVPB 800 mg        800 mg 100 mL/hr over 240 Minutes Intravenous Every 24 hours 10/24/19 1756 10/24/19 2240   10/12/19 2000  piperacillin-tazobactam (ZOSYN) IVPB 3.375 g  Status:  Discontinued        3.375 g 12.5 mL/hr over 240 Minutes Intravenous Every 8 hours 10/12/19 1911 10/29/19 1945   10/12/19 1708  ceFAZolin (ANCEF) 2-4 GM/100ML-% IVPB       Note to Pharmacy: Bennye Alm   : cabinet override      10/12/19 1708 10/12/19 1849    .  Subjective  The patient is resting comfortably. No new complaints. Daughter at bedside. Objective   Vitals:  Vitals:   11/25/19 0602 11/25/19 1028  BP: (!) 142/75 (!) 134/69  Pulse: 76 101  Resp: 15   Temp: (!) 97.5 F (36.4 C)   SpO2: 95%    Exam:  Constitutional:  . The patient is awake, alert, but non-verbal.  No acute distress. He appears chronically ill, fatigued and weak. Respiratory:  . No increased work of breathing. . No wheezes, rales, or rhonchi . No tactile fremitus Cardiovascular:  . Regular rate and rhythm . No murmurs, ectopy, or gallups. . No lateral PMI. No thrills. Abdomen:  . Abdomen is soft, non-tender, non-distended . No hernias, masses, or organomegaly . Normoactive bowel sounds.  Musculoskeletal:  . No cyanosis, clubbing, or edema Skin:  . No rashes, lesions, ulcers . palpation of skin: no induration or nodules Neurologic:  . CN 2-12 intact . Sensation all 4 extremities  intact Psychiatric:  . Mental status o Mood, affect appropriate o Orientation to person, place, time  . judgment and insight appear intact  I have personally reviewed the following:   Today's Data  . Vitals, BMP, Hemoglobin and hematocrit.   Imaging  . Fistulogram  Scheduled Meds: . acetaminophen  650 mg Oral TID  . bisacodyl  10 mg Rectal Daily  . chlorhexidine  15 mL Mouth Rinse BID  . Chlorhexidine Gluconate Cloth  6 each Topical Daily  . feeding supplement (PRO-STAT SUGAR FREE 64)  30 mL Oral BID  . fentaNYL  1 patch Transdermal Q72H  . lidocaine  2 patch Transdermal Q24H  . LORazepam  1 mg Oral QHS  . melatonin  10 mg Per Tube QHS  . metoprolol tartrate  50 mg Oral BID  . pantoprazole  40 mg Oral QHS  . senna-docusate  1 tablet Oral QHS  . simethicone  160 mg Oral QID  . sodium chloride flush  10-40 mL Intracatheter Q12H  . sodium chloride flush  5 mL Intracatheter Q8H   Continuous Infusions: . sodium chloride 250 mL (11/22/19 0004)  . methocarbamol (ROBAXIN) IV 500 mg (11/17/19 0119)    Principal Problem:   Severe sepsis with septic shock (HCC) Active Problems:   HTN (hypertension)   Malignant neoplasm of prostate (HCC)   Hyperlipidemia   Obstipation   HLD (hyperlipidemia)   Diabetes mellitus type 2, uncontrolled, with complications (HCC)   Perforated bowel (HCC)   Perforated duodenal ulcer ([place])   Acute respiratory insufficiency   Elevated troponin level   Acute respiratory failure with hypoxia (HCC)   PVCs (premature ventricular contractions)   Bilateral atelectasis   Acute abdominal pain   Palliative care by specialist   Goals of care, counseling/discussion   General weakness   Epigastric pain   Gastric ulcer   Hypokalemia   NSVT (nonsustained ventricular tachycardia) (HCC)   Diarrhea   NSTEMI (non-ST elevated myocardial infarction) ([place])   Acute deep vein thrombosis (DVT) of left upper extremity (HCC)   Acute systolic CHF (congestive heart  failure) (HCC)   Peritonitis (HCC)   Anemia of chronic disease   Hypotension   Nontraumatic retroperitoneal hematoma: 11/08/2019   Hemorrhagic shock (HCC):11/08/2019   Acute respiratory failure with hypoxemia (HCC)   Abdominal fluid collection   Abscess   LOS: 44 days   A & P  Septic shock secondary to peritonitis due to perforated duodenal ulcer: The patient is tatus post expiratory laparotomy, omental patch repair of duodenal ulcer per Dr. [name] 10/12/2019 with postop leak/postop IR fistula tract to the duodenal bulb.Currently afebrile. Normal white count. Patient noted to have upper GI series 10/28/2019 showing persistent small leak with repeat upper GI series done on 11/03/2018 one of the duodenal bulb likely related to prior surgical repair. No definite persistent leak identified on today's examination. Patient noted to have fluid collections 10/21/2019 with drain placement with cultures positive for Candida. Status post drain placement. He is on IV fluconazole. Patient noted to have a T-max of 102.1 on 7/1/ 2021 with a change in antibiotics from IV vancomycin(s/p 5 days) to IV Merrem. Blood cultures obtained with no growth to date. Currently afebrile with a normal white count. Dr. [name] discussed with general surgery, Dr. [name] on 10/30/2019 and was felt that enlarging hematoma was not source of infection as it would drain into the midline wound. Continue IV PPI, bowel rest, NG tube, TPN. Patient s/p 8 days of IV Merrem. IV of Merrem has been discontinued. Patient afebrile. Loose stools have decreased. C. difficile PCR was negative. Patient status post fistulogram which showedfistula tract to the duodenal bulb ongoing. Right upper quadrant drain intact. Per general surgery likely need a repeat fistulogram this week to assess for closure. Continue IV fluconazole while drain in place as cultures were positive for Candida albicans. Mobilize. KUB performed this morning demonstrated  colonic ileus. NG tube has been removed. Will likely need SLP evaluation prior to p.o. intake.Per IR repeat fistulogram on 11/13/2019.General surgery recommending ongoing n.p.o. and drain for now. Per general surgery. IR following. Repeat fistulogram was performed on 11/13/2019. 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 Drain catheter has been left in place. On the morning of 11/25/2019 the patient went for follow-up fistulogram. 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 That drain will currently left in place as it is unclear what the exact amount of current daily fluid  output is. Once the output is minimal, the percutaneous drainage catheter will be removed and no further drain injection of CT is necessary unless there is a clinical change warranting re-imaging. After meeting with palliative care the patient's family agreed to make him palliative care. However, in the last week they have changed there mind. They now desire to continue to seek treatment, although with palliation as well. The patient is awaiting discharge to SNF with palliative care.  Hypotension/hemorrhagic shock secondary to large retroperitoneal: Resolved. Patient noted to be hypotensive 1/69/6789 with systolic blood pressures in the 70s requiring transfer to the ICU and pressors of Levophed. CBC done noted a hemoglobin of 5.9. CT abdomen and pelvis done consistent with a large retroperitoneal hematoma with evidence of active bleed within the right psoas muscle. Persistent but decreased size of right rectus muscle hematoma. Patient was transferred to the ICU and transfused 2 units of FFP, 4units totalof packed red blood cells on 11/08/2019,11/09/2019. Levophed subsequently titrated off.Hemoglobin currently at 9.3 after 4 units of packed red blood cells over the past 48 hours or  so. IV heparin has been discontinued and patient no longer a candidate for anticoagulation. Clonidine patch discontinued. Nitroglycerin paste discontinued. IV Lopressor dose decreased. Follow H&H. PCCM consulted and were following. They have now signed off.   Colonic ileus: Question role of severe constipation due to opioid use. The patient is on relistor. Abdomen continues to appear distended.  Acute hypoxic respiratory failure: The patient is currently saturating at 96% on room air. He is status post extubation 10/15/2019.   Acute systolic heart failure/non-STEMI/coronary artery disease 2000/NSVT: Compensated. Euvolemic. Blood pressure initially was stable however patient now noted th to be hypotensive on 3/81/0175 with systolic blood pressures in the 70s. Clonidine patch discontinued. IV beta-blocker decreased to 5 mg every 4 hours. Early on in the hospitalization patient noted to have a 5-6 beat run of nonsustained V. tach however asymptomatic on 11/04/2019. Potassium at 4.3. Magnesium at 2.2. Phosphorus at 4. IV beta-blocker dose had been increased to 10 mg every 4 hours per cardiology however due to hemorrhagic shock IV Lopressor has been decreased. If develops nonsustained V. tach may up-titrate Lopressor as tolerated by blood pressure back to 10 mg IV every 4 hours. Keep potassium >4, keep magnesium >2.   Left upper extremity DVT cephalic/axillary vein: Noted. Likely secondary to PICC line. IV heparin discontinued 11/08/2019 due to hemorrhagic shock secondary to large retroperitoneal hematoma.   Right upper extremity swelling and pain and discomfort: Heparin initially discontinued on 10/29/2019 after enlarging hematoma. Heparin was resumed without a bolus on 10/31/2019 after Dr. [name] discussed with a specialist. Right upper extremity discomfort initially felt to be secondary to a DVT however Dopplers negative. Patient now with large retroperitoneal hematoma with hemorrhagic  shock on 11/08/2019 and as such heparin discontinued. Patient likely not a candidate for anticoagulation. Supportive care.   Well-controlled diabetes mellitus type 2: Hemoglobin A1c 6.6. Blood glucose level of 112this morning. TPN discontinued.   Anemia of critical illness/acute blood loss anemia: Status post transfusion of 2 units packed red blood cells (10/25/2019). Patient underwent hemorrhagic shock on 11/08/2019 secondary to large retroperitoneal hematoma. Status post transfusion of 2 units packed red blood cells 11/08/2019 and 2 units of FFP. Hemoglobin at 6.7 on 11/09/2019 and patient transfused another 2 units of packed red blood cells hemoglobin currently at 9.0.Transfusion threshold hemoglobin <7.   Malignancy of the prostate: Noted. Follow up as outpatient.  Tobacco abuse: Tobacco cessation.  Diarrhea: Likely due to TPN. Per RN patient with voluminous loose stools noted in rectal pouch a6-7 days ago which has since improved. C. difficile PCR negative. KUB negative. IV antibiotics discontinued. 4 stools yesterday.  Lower extremity pain: Secondary to retroperitoneal hematoma.Clinical improvement with transfusion of packed red blood cells and FFP in addition to pain management. Continue Lidoderm patch, IV Robaxin as needed, IV Tylenol.   Prognosis: Patient with multiple comorbidities, complicated course during this hospitalization. Patient admitted with sepsis secondary to peritonitis, duodenal ulcer with perforation status post repair, complicated by non-STEMI, acute CHF. Patient with ongoing fistula currently n.p.o. on TPN, left upper extremity DVT placed on heparin however complicated by abdominal hematoma and now with large retroperitoneal hematoma leading to hemorrhagic shock requiring pressors and currently off pressors. Patient transfused total of 6 units packed red blood cells during this hospitalization,as well as 2 units of FFP. Palliative care following.  Patient with a poor prognosis. Family has now decided to make the patient DNR. The patient himself has expressed his desire to end aggressive interventions and to seek comfort and support for the remainder of his days.  After meeting with palliative care the patient's family agreed to make him palliative care. However, in the last week they have changed there mind. They now desire to continue to seek treatment, although with palliation as well. The patient is awaiting discharge to SNF with palliative care.  I have seen and examined this patient myself. I have spent 34 minutes in his evaluation and care.  DVT prophylaxis:SCDs Code Status:Full Family Communication:Updated patient and daughter at bedside. Disposition:The patient is from home. He is admitted as an inpatient. Anticipate discharge to residential hospice. Barriers to discharge include need to set the patient up for hospice at home and to get daughter Matthew Schwartz on board with the plan.  Status is: Inpatient  Dispo: Patient From: [place] Planned Disposition: LTAC vs SNF Expected discharge date: 11/27/19 Medically stable for discharge: Medically safe for discharge.   Prosperity Darrough, DO [facility] Direct contact: see [email or web address]  7PM-7AM contact night coverage as above 11/25/2019, 4:54 PM  LOS: 44 days

## 2019-11-25 NOTE — Progress Notes (Signed)
[facility] Inpatient rehab admissions:  Noted that PT now recommending SNF.  I spoke with case manager, Alinda Sierras, and patient/daughter are now in agreement to the need for SNF placement.  I will sign off for acute inpatient rehab at this time.  Call me if you have questions.  [phone]

## 2019-11-25 NOTE — Progress Notes (Signed)
Transitioning patient off hydromorphone infusion today and converting to a transdermal fentanyl patch. Dose conversion is 63mcg patch, will stop drip 6 hours after patch is placed and provide PRN bolus doses for breakthrough pain. Adjusted PM sleep medications- patient requested adjustment last PM, CHL was down so I made those changes this AM.   Patient continues to show no signs of immanent decline- his goal is to be able to get strong enough to go home- his Hb drop needs to be monitored and he remains fragile medically. No signs of infection- his PO intake is improving each day. Drain output unchanged-appreciate IR following.  Goals are no longer strictly comfort care although he has responded best to a more palliative approach- for now we are trying to help him achieve his goal of going home and he has to regain some strength and family will need education on how to care for him at home.They are willing to hire in home care givers and have the resources to do this.  Will continue to support patient and family through next transition.  Lane Hacker, DO Palliative Medicine   Time: 35 min Greater than 50%  of this time was spent counseling and coordinating care related to the above assessment and plan.

## 2019-11-25 NOTE — Consult Note (Signed)
[facility] Nurse Consult Note: Reason for Consult:incontinence associated dermatitis (IAD)  from prolonged constipation and then voluminous diarrhea.  Wound type: IAD, Moisture associated skin damage (MASD) to perirectal area Pressure Injury POA: NA Measurement: circumferential breakdown Wound WUG:QBVQ and moist Drainage (amount, consistency, odor) scant weeping Periwound:rectal tissue Dressing procedure/placement/frequency: Barrier cream to rectum twice daily.  Will not follow at this time.  Please re-consult if needed.  Domenic Moras MSN, RN, FNP-BC [facility] Wound, Ostomy, Continence Nurse Pager [phone]

## 2019-11-25 NOTE — TOC Initial Note (Signed)
Transition of Care [facility]) - Initial/Assessment Note    Patient Details  Name: Matthew Schwartz MRN: [id number] Date of Birth: 07-21-1932  Transition of Care [facility]) CM/SW Contact:    Lynnell Catalan, RN Phone Number: 11/25/2019, 1:55 PM  Clinical Narrative:                 This CM met with pt and daughter at bedside for DC planning. Pt had just worked with PT/OT and recommendations were made for SNF. Daughter mentioned wanting pt to go to CIR, however, due to pt's level of deconditioning it is very unlikely that pt would be able to tolerate 3 hours of intensive PT/OT at CIR. This was discussed with daughter and pt. They agree to looking into SNF placement and their first choice would be for him to go to [facility] in [place]. FL2 faxed out to area facilities and SNF insurance auth started with [facility]. Dilaudid drip was discontinued today which is moving pt closer to dc. TOC will follow and provide daughters with SNF bed offers when they are available.   Expected Discharge Plan: Skilled Nursing Facility Barriers to Discharge: Continued Medical Work up   Patient Goals and CMS Choice Patient states their goals for this hospitalization and ongoing recovery are:: to go home with my family CMS Medicare.gov Compare Post Acute Care list provided to:: Patient Choice offered to / list presented to : Patient  Expected Discharge Plan and Services Expected Discharge Plan: [place]   Discharge Planning Services: CM Consult   Living arrangements for the past 2 months: Single Family Home                                      Prior Living Arrangements/Services Living arrangements for the past 2 months: Single Family Home Lives with:: Spouse Patient language and need for interpreter reviewed:: No Do you feel safe going back to the place where you live?: Yes      Need for Family Participation in Patient Care: Yes (Comment) Care giver support system in place?: Yes (comment)    Criminal Activity/Legal Involvement Pertinent to Current Situation/Hospitalization: No - Comment as needed  Activities of Daily Living Home Assistive Devices/Equipment: CBG Meter, Other (Comment) (walking stick) ADL Screening (condition at time of admission) Patient's cognitive ability adequate to safely complete daily activities?: Yes Is the patient deaf or have difficulty hearing?: No Does the patient have difficulty seeing, even when wearing glasses/contacts?: No Does the patient have difficulty concentrating, remembering, or making decisions?: No Patient able to express need for assistance with ADLs?: Yes Does the patient have difficulty dressing or bathing?: Yes Independently performs ADLs?: No Communication: Independent Dressing (OT): Needs assistance Is this a change from baseline?: Change from baseline, expected to last >3 days Grooming: Needs assistance Is this a change from baseline?: Change from baseline, expected to last >3 days Feeding: Needs assistance Is this a change from baseline?: Change from baseline, expected to last >3 days Bathing: Needs assistance Is this a change from baseline?: Change from baseline, expected to last >3 days Toileting: Needs assistance Is this a change from baseline?: Change from baseline, expected to last >3days In/Out Bed: Needs assistance Is this a change from baseline?: Change from baseline, expected to last >3 days Walks in Home: Needs assistance Is this a change from baseline?: Change from baseline, expected to last >3 days Does the patient have difficulty  walking or climbing stairs?: Yes (secondary to weakness) Weakness of Legs: Both Weakness of Arms/Hands: Both  Permission Sought/Granted                  Emotional Assessment       Orientation: : Oriented to Self, Oriented to Place, Oriented to  Time, Oriented to Situation Alcohol / Substance Use: Not Applicable Psych Involvement: No (comment)  Admission diagnosis:   Hypokalemia [E87.6] Abdominal distention [R14.0] Acute abdominal pain [R10.9] Epigastric pain [R10.13] PVCs (premature ventricular contractions) [I49.3] Tachyarrhythmia [R00.0] Obstipation [K59.00] Elevated troponin level [R77.8] Nausea and vomiting in adult [R11.2] Perforated bowel (HCC) [K63.1] Perforated duodenal ulcer ([place]) [K26.5] Patient Active Problem List   Diagnosis Date Noted  . Abscess   . Nontraumatic retroperitoneal hematoma: 11/08/2019 11/09/2019  . Hemorrhagic shock (HCC):11/08/2019 11/09/2019  . Acute respiratory failure with hypoxemia ([place])   . Abdominal fluid collection   . Anemia of chronic disease   . Hypotension   . Gastric ulcer   . Hypokalemia   . NSVT (nonsustained ventricular tachycardia) ([place])   . Diarrhea   . Severe sepsis with septic shock ([place])   . NSTEMI (non-ST elevated myocardial infarction) ([place])   . Acute deep vein thrombosis (DVT) of left upper extremity ([place])   . Acute systolic CHF (congestive heart failure) ([place])   . Peritonitis ([place])   . Epigastric pain   . Palliative care by specialist   . Goals of care, counseling/discussion   . General weakness   . Acute abdominal pain   . Bilateral atelectasis 10/17/2019  . Acute respiratory insufficiency   . Elevated troponin level   . Acute respiratory failure with hypoxia ([place])   . PVCs (premature ventricular contractions)   . Obstipation 10/12/2019  . HLD (hyperlipidemia) 10/12/2019  . Diabetes mellitus type 2, uncontrolled, with complications ([place]) 15/17/6160  . Perforated bowel ([place]) 10/12/2019  . Perforated duodenal ulcer ([place]) 10/12/2019  . Anemia 09/07/2016  . Coronary artery disease due to lipid rich plaque 02/16/2015  . Hyperlipidemia 02/16/2015  . Malignant neoplasm of prostate ([place]) 02/18/2014  . HTN (hypertension) 06/14/2013  . Dyslipidemia 06/14/2013  . GERD (gastroesophageal reflux disease) 06/14/2013   PCP:  [name], MD Pharmacy:   [id number]  [id number]: [phone]: [phone]    Social Determinants of Health (SDOH) Interventions    Readmission Risk Interventions Readmission Risk Prevention Plan 11/09/2019  Transportation Screening Complete  Medication [place] ([place] Complete  [place] or [place]. [place]  SW Recovery Care/Counseling Consult Complete  [place] Applicable  Some recent data might be hidden

## 2019-11-25 NOTE — Progress Notes (Signed)
Occupational Therapy Treatment Patient Details Name: Matthew Schwartz MRN: [id number] DOB: 06-26-1932 Today's Date: 11/25/2019    History of present illness Pt s/p exploratory lap (10/12/19) to repair perforated duodenal ulcer and then on vent with extubation 10/14/19.  Pt with acute respiratory failure with hypoxia and acute systolic CHF in setting of sepsis and peritonitis.  Pt with hx of DM, prostate CA and bil TKR   OT comments  Patient requiring extensive assistance max A x2 to roll in bed for positioning of maximove pad. Max cues for initiation of reaching for bed rails, patient is very weak with limited ability to use UEs to assist with rolling. Patient required max to total A x2 to reposition in chair due to strong posterior lean and weakness in core strength limiting ability to sit forward in chair to scoot back as patient has tendency to slide forward. Patient reports nausea at end of treatment, notified nursing. Continue to recommend discharge to venue listed below to work on strengthening and activity tolerance to increase patient independence with self care.  Patient appears to have limited insight to deficits, asked therapist more than once why we are getting him to chair the "complicated way" referring to [facility] and why can't he sit up at edge of bed. Try to educate patient that he needs the strength and core stability to sit up with minimal assistance before we can try standing and currently requires max to total A to sit at EOB due to posterior lean.   Follow Up Recommendations  SNF;Supervision/Assistance - 24 hour    Equipment Recommendations  Other (comment) (TBD)       Precautions / Restrictions Precautions Precautions: Fall Precaution Comments: 1 right drain--abd, may have loose BM       Mobility Bed Mobility Overal bed mobility: Needs Assistance Bed Mobility: Rolling Rolling: Max assist;+2 for physical assistance         General bed mobility comments: multimodal  cues used to initiate assist from patient, very weak with limited ability to utilize UEs to assist with rolling in bed  Transfers Overall transfer level: Needs assistance               General transfer comment: maximove to chair    Balance Overall balance assessment: Needs assistance Sitting-balance support: Bilateral upper extremity supported;Feet supported Sitting balance-Leahy Scale: Zero Sitting balance - Comments: strong posterior lean in recliner requiring max to total assistance to sit forward, unable to maintain forward position without significant assist                                   ADL either performed or assessed with clinical judgement   ADL Overall ADL's : Needs assistance/impaired                         Toilet Transfer: Total assistance;+2 for physical assistance;+2 for safety/equipment Toilet Transfer Details (indicate cue type and reason): hoyer lift to chair Toileting- Clothing Manipulation and Hygiene: Total assistance;+2 for physical assistance;+2 for safety/equipment;Bed level Toileting - Clothing Manipulation Details (indicate cue type and reason): rolling in bed     Functional mobility during ADLs: Total assistance;+2 for physical assistance;+2 for safety/equipment;Cueing for sequencing (hoyer lift) General ADL Comments: worked on core strengthening in chair necessary to correct posture however patient requires max to total A to sit forward in chair for repositioning  Cognition Arousal/Alertness: Awake/alert Behavior During Therapy: Flat affect Overall Cognitive Status: Difficult to assess                                 General Comments: decreased initiation from patient to follow 1 step directions to assist with rolling in bed, sitting up in chair. unsure if this is due to [facility], possible language barrier. patient does verbalize with therapy with repetitive prompting                    Pertinent Vitals/ Pain       Pain Assessment: 0-10 Pain Score: 5  Pain Location: "everywhere" Pain Descriptors / Indicators: Grimacing;Discomfort Pain Intervention(s): Monitored during session         Frequency  Min 2X/week        Progress Toward Goals  OT Goals(current goals can now be found in the care plan section)  Progress towards OT goals: Goals drowngraded-see care plan  Acute Rehab OT Goals Patient Stated Goal: pt requesting to sit up per DTR and nursing OT Goal Formulation: With family Time For Goal Achievement: 12/09/19 Potential to Achieve Goals: Fair ADL Goals Pt Will Perform Eating: with mod assist;sitting (EOB) Pt Will Perform Grooming: with mod assist;sitting (EOB) Pt Will Perform Upper Body Bathing: with mod assist;sitting (EOB) Pt Will Perform Upper Body Dressing: with mod assist;sitting (EOB) Pt Will Transfer to Toilet: with max assist;with +2 assist;squat pivot transfer;bedside commode Pt Will Perform Toileting - Clothing Manipulation and hygiene: with max assist;sit to/from stand;sitting/lateral leans Pt/caregiver will Perform Home Exercise Program: Increased strength;Both right and left upper extremity;With theraband;With minimal assist  Plan Discharge plan remains appropriate    Co-evaluation    PT/OT/SLP Co-Evaluation/Treatment: Yes Reason for Co-Treatment: For patient/therapist safety;To address functional/ADL transfers   OT goals addressed during session: ADL's and self-care      AM-PAC OT "6 Clicks" Daily Activity     Outcome Measure   Help from another person eating meals?: A Little Help from another person taking care of personal grooming?: A Little Help from another person toileting, which includes using toliet, bedpan, or urinal?: Total Help from another person bathing (including washing, rinsing, drying)?: Total Help from another person to put on and taking off regular upper body clothing?: Total Help from another person to  put on and taking off regular lower body clothing?: Total 6 Click Score: 10    End of Session Equipment Utilized During Treatment: Other (comment) (maximove)  OT Visit Diagnosis: Muscle weakness (generalized) (M62.81);Pain Pain - part of body:  ("everywhere")   Activity Tolerance Patient limited by fatigue   Patient Left in chair;with call bell/phone within reach;with family/visitor present   Nurse Communication Mobility status;Need for lift equipment;Other (comment) (requesting nausea medication)        Time: 9211-9417 OT Time Calculation (min): 35 min  Charges: OT General Charges $OT Visit: 1 Visit OT Treatments $Therapeutic Activity: 8-22 mins  [name] OT Pager: [place] 11/25/2019, 10:21 AM

## 2019-11-25 NOTE — Progress Notes (Signed)
Palliative Care Progress Note  I met today with Matthew Schwartz and his daughter, Matthew Schwartz.  We discussed his clinical course and options for care moving forward.  [name] reports that family understands that he is still at high risk for decompensation, and they want to avoid putting him through more if he is not going to recover.  Earlier this week, plan had been for comfort care.   He improved with a focus only on his comfort, and family was hopeful that he may be improving enough to again consider CIR.  He was evaluated and deemed to not be a candidate.  We discussed options moving forward including: 1) Home with hospice (family worries about being able to meet his care needs in his current debilitated state) 2) Home with home health (family again worries about being able to meet his care needs) 3) Residential hospice (has been improving over last couple of days. Not sure he is still a good candidate/family wants to see if he can continue to improve) 4) CIR (not a candidate) 5) SNF for attempt at rehab (family does not want him to bounce back and forth from rehab to hospital if he decompensates)  Overall, his daughter reports that it is a difficult position as he has had such a labile course.  She reports that if his trajectory were clear, it would be much easier for them to plan for the future.  Plan is to see what options are for SNF, but are also concerned that if he worsens again, they may want him to be comfortable rather than transition back to the hospital.   - Plan to meet again tomorrow at [facility].  Hopefully we will have potential SNF acceptances available for family to review.  Likely plan will be to reevaluate his situation, and if he appears stable, evaluate options for SNF while also working to develop best contingency plan to avoid further aggressive interventions/hospitalizations in the event he worsens at rehab.         Micheline Rough, MD [facility]  Team [phone]  NO CHARGE NOTE

## 2019-11-26 MED ORDER — MELATONIN 5 MG PO TABS
10.0000 mg | ORAL_TABLET | Freq: Every day | ORAL | Status: DC
  Administered 2019-11-26 – 2019-11-30 (×5): 10 mg via ORAL
  Filled 2019-11-26 (×5): qty 2

## 2019-11-26 NOTE — Progress Notes (Signed)
PROGRESS NOTE  Matthew Schwartz HBZ:[id number] DOB: [birth year] DOA: 10/12/2019 PCP: [name], MD  Brief History   [name] NIDDM, HLD, GERD, irritable bowel syndrome (constipation) status post left knee replacement 2009, PTCA 2000-->RCA well as prior prostate CA, former smoker Admit 10/12/2019 severe abdominal pain-ex lap 10/12/2019-perforated duodenal ulcer and remained on vent NSVT postop-echo showed Systolic HF--was on pressors and cardiology consulted-eventually extubated 6/17 + fevers 6/23= postoperative abscess-IR Placed abd perc drain 6/24--growing Candida /on diflucan On 6/30 upper GI series were performed-and repeated Unfortunately spiked a fever without cause-found to have on CT 7/1 enlarging hematoma General surgery felt this was minimal and not the cause for his fever He has not had high-grade fever since his Antibiotics were adjusted 7/1 vancomycin and Primaxin but continues to have low-grade Heparin was held temporarily until 7/3 and restarted cautiously 7/3 after risk-benefit discussion with family  He has stabilized to some degree on stepdown unit he has had some pain and swelling in his upper extremities likely from the prior DVT in his left upper extremity-his right arm was imaged for DVT and found to be negative He has a long road ahead and TOC is working with patient and family to figure out next steps in terms of placement he may be able to eventually go to an LTAC.  On the morning of 11/25/2019 the patient went for follow-up fistulogram. 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 That drain will currently left in place as it is unclear what the exact amount of current daily fluid output is. Once the output is minimal, the percutaneous drainage catheter has now been removed and no further drain injection of CT is  necessary unless there is a clinical change warranting re-imaging.  TOC is seeking SNF placement.  Consultants  . Wound Care . Interventional Radiology . Palliative Care . Cardiology . PCCM . General Surgery . Gastroenterology . Urology  Procedures   CT abdomen and pelvis 10/21/2019, 10/29/2019  CT angiogram abdomen and pelvis 10/11/2019  Upper GI series 10/19/2019, 10/28/2019, 11/03/2019  2D echo 10/13/2019, 10/16/2019  Right upper extremity Doppler 11/02/2019  Left upper extremity Dopplers 10/17/2019  CT-guided drain placement right upper quadrant fluid collection per interventional radiology, Dr. Earleen Newport 10/22/2019  Exploratory laparotomy, omental patch repair of duodenal ulcer per Dr. [name] 10/12/2019  Right IJ CVL 10/12/2019>>>> 10/13/2019  Right radial A-line 10/12/2019>>>> 10/15/2019  ETT 10/12/2019>>>> 10/15/2019  Left upper extremity PICC 10/14/2019  Fistulogram 11/06/2019, 10/30/2019, 11/25/2019  Transfusion 2 units packed red blood cells 10/25/2019  Transfusion of 2 units packed red blood cells 11/08/2019  Transfusion of 2 units FFP 11/08/2019  Transfuse 2 units packed red blood cells 11/09/2019  Antibiotics   Anti-infectives (From admission, onward)   Start     Dose/Rate Route Frequency Ordered Stop   11/13/19 2200  fluconazole (DIFLUCAN) IVPB 400 mg  Status:  Discontinued        400 mg 100 mL/hr over 120 Minutes Intravenous Every 24 hours 11/13/19 1917 11/19/19 0925   11/06/19 1800  fluconazole (DIFLUCAN) IVPB 400 mg  Status:  Discontinued        400 mg 100 mL/hr over 120 Minutes Intravenous Every 24 hours 11/05/19 1850 11/13/19 1835   10/31/19 2200  vancomycin (VANCOREADY) IVPB 1250 mg/250 mL  Status:  Discontinued        1,250 mg 166.7 mL/hr over 90 Minutes Intravenous Every  12 hours 10/31/19 1602 11/03/19 1152   10/29/19 2200  meropenem (MERREM) 1 g in sodium chloride 0.9 % 100 mL IVPB        1 g 200 mL/hr over 30 Minutes Intravenous Every 8 hours 10/29/19 2008  11/05/19 2207   10/29/19 2200  vancomycin (VANCOCIN) IVPB 1000 mg/200 mL premix  Status:  Discontinued        1,000 mg 200 mL/hr over 60 Minutes Intravenous Every 12 hours 10/29/19 2008 10/31/19 1602   10/25/19 1800  fluconazole (DIFLUCAN) IVPB 400 mg  Status:  Discontinued        400 mg 100 mL/hr over 120 Minutes Intravenous Every 24 hours 10/24/19 1750 11/05/19 1850   10/24/19 1830  fluconazole (DIFLUCAN) IVPB 800 mg  Status:  Discontinued        800 mg 200 mL/hr over 120 Minutes Intravenous  Once 10/24/19 1750 10/24/19 1755   10/24/19 1830  fluconazole (DIFLUCAN) IVPB 800 mg        800 mg 100 mL/hr over 240 Minutes Intravenous Every 24 hours 10/24/19 1756 10/24/19 2240   10/12/19 2000  piperacillin-tazobactam (ZOSYN) IVPB 3.375 g  Status:  Discontinued        3.375 g 12.5 mL/hr over 240 Minutes Intravenous Every 8 hours 10/12/19 1911 10/29/19 1945   10/12/19 1708  ceFAZolin (ANCEF) 2-4 GM/100ML-% IVPB       Note to Pharmacy: Bennye Alm   : cabinet override      10/12/19 1708 10/12/19 1849     Subjective  The patient is resting comfortably. No new complaints. Daughter at bedside. Objective   Vitals:  Vitals:   11/25/19 1028 11/26/19 0610  BP: (!) 134/69 (!) 147/75  Pulse: 101 77  Resp:  18  Temp:  (!) 97.5 F (36.4 C)  SpO2:  97%   Exam:  Constitutional:  . The patient is awake, alert, but non-verbal.  No acute distress. He appears chronically ill, fatigued and weak. Respiratory:  . No increased work of breathing. . No wheezes, rales, or rhonchi . No tactile fremitus Cardiovascular:  . Regular rate and rhythm . No murmurs, ectopy, or gallups. . No lateral PMI. No thrills. Abdomen:  . Abdomen is soft, non-tender, non-distended . No hernias, masses, or organomegaly . Normoactive bowel sounds.  Musculoskeletal:  . No cyanosis, clubbing, or edema Skin:  . No rashes, lesions, ulcers . palpation of skin: no induration or nodules Neurologic:  . CN 2-12  intact . Sensation all 4 extremities intact Psychiatric:  . Mental status o Mood, affect appropriate o Orientation to person, place, time  I have personally reviewed the following:   Today's Data  . Vitals  Imaging  . Fistulogram  Scheduled Meds: . acetaminophen  650 mg Oral TID  . bisacodyl  10 mg Rectal Daily  . chlorhexidine  15 mL Mouth Rinse BID  . Chlorhexidine Gluconate Cloth  6 each Topical Daily  . feeding supplement (PRO-STAT SUGAR FREE 64)  30 mL Oral BID  . fentaNYL  1 patch Transdermal Q72H  . lidocaine  2 patch Transdermal Q24H  . LORazepam  1 mg Oral QHS  . melatonin  10 mg Oral QHS  . metoprolol tartrate  50 mg Oral BID  . pantoprazole  40 mg Oral QHS  . senna-docusate  1 tablet Oral QHS  . simethicone  160 mg Oral QID  . sodium chloride flush  10-40 mL Intracatheter Q12H  . sodium chloride flush  5 mL Intracatheter Q8H  Continuous Infusions: . sodium chloride 250 mL (11/22/19 0004)  . methocarbamol (ROBAXIN) IV 500 mg (11/17/19 0119)    Principal Problem:   Severe sepsis with septic shock (HCC) Active Problems:   HTN (hypertension)   Malignant neoplasm of prostate (HCC)   Hyperlipidemia   Obstipation   HLD (hyperlipidemia)   Diabetes mellitus type 2, uncontrolled, with complications (HCC)   Perforated bowel (HCC)   Perforated duodenal ulcer ([place])   Acute respiratory insufficiency   Elevated troponin level   Acute respiratory failure with hypoxia (HCC)   PVCs (premature ventricular contractions)   Bilateral atelectasis   Acute abdominal pain   Palliative care by specialist   Goals of care, counseling/discussion   General weakness   Epigastric pain   Gastric ulcer   Hypokalemia   NSVT (nonsustained ventricular tachycardia) (HCC)   Diarrhea   NSTEMI (non-ST elevated myocardial infarction) ([place])   Acute deep vein thrombosis (DVT) of left upper extremity (HCC)   Acute systolic CHF (congestive heart failure) (HCC)   Peritonitis (HCC)    Anemia of chronic disease   Hypotension   Nontraumatic retroperitoneal hematoma: 11/08/2019   Hemorrhagic shock (HCC):11/08/2019   Acute respiratory failure with hypoxemia (HCC)   Abdominal fluid collection   Abscess   LOS: 45 days   A & P  Septic shock secondary to peritonitis due to perforated duodenal ulcer: The patient is tatus post expiratory laparotomy, omental patch repair of duodenal ulcer per Dr. [name] 10/12/2019 with postop leak/postop IR fistula tract to the duodenal bulb.Currently afebrile. Normal white count. Patient noted to have upper GI series 10/28/2019 showing persistent small leak with repeat upper GI series done on 11/03/2018 one of the duodenal bulb likely related to prior surgical repair. No definite persistent leak identified on today's examination. Patient noted to have fluid collections 10/21/2019 with drain placement with cultures positive for Candida. Status post drain placement. He is on IV fluconazole. Patient noted to have a T-max of 102.1 on 7/1/ 2021 with a change in antibiotics from IV vancomycin(s/p 5 days) to IV Merrem. Blood cultures obtained with no growth to date. Currently afebrile with a normal white count. Dr. [name] discussed with general surgery, Dr. [name] on 10/30/2019 and was felt that enlarging hematoma was not source of infection as it would drain into the midline wound. Continue IV PPI, bowel rest, NG tube, TPN. Patient s/p 8 days of IV Merrem. IV of Merrem has been discontinued. Patient afebrile. Loose stools have decreased. C. difficile PCR was negative. Patient status post fistulogram which showedfistula tract to the duodenal bulb ongoing. Right upper quadrant drain intact. Per general surgery likely need a repeat fistulogram this week to assess for closure. Continue IV fluconazole while drain in place as cultures were positive for Candida albicans. Mobilize. KUB performed this morning demonstrated colonic ileus. NG tube has been  removed. Will likely need SLP evaluation prior to p.o. intake.Per IR repeat fistulogram on 11/13/2019.General surgery recommending ongoing n.p.o. and drain for now. Per general surgery. IR following. Repeat fistulogram was performed on 11/13/2019. 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 Drain catheter has been left in place. On the morning of 11/25/2019 the patient went for follow-up fistulogram. 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 That drain will currently left in place as it is unclear what the exact amount of current daily fluid output is. Once the output is  minimal, the percutaneous drainage catheter will be removed and no further drain injection of CT is necessary unless there is a clinical change warranting re-imaging. After meeting with palliative care the patient's family agreed to make him palliative care. However, in the last week they have changed there mind. They now desire to continue to seek treatment, although with palliation as well. The patient is awaiting discharge to SNF with palliative care. Today he seems more appropriate for residential hospice. I appreciate palliative care's assistance.  Hypotension/hemorrhagic shock secondary to large retroperitoneal: Resolved. Patient noted to be hypotensive 6/78/9381 with systolic blood pressures in the 70s requiring transfer to the ICU and pressors of Levophed. CBC done noted a hemoglobin of 5.9. CT abdomen and pelvis done consistent with a large retroperitoneal hematoma with evidence of active bleed within the right psoas muscle. Persistent but decreased size of right rectus muscle hematoma. Patient was transferred to the ICU and transfused 2 units of FFP, 4units totalof packed red blood cells on 11/08/2019,11/09/2019. Levophed subsequently titrated off.Hemoglobin currently at 9.3  after 4 units of packed red blood cells over the past 48 hours or so. IV heparin has been discontinued and patient no longer a candidate for anticoagulation. Clonidine patch discontinued. Nitroglycerin paste discontinued. IV Lopressor dose decreased. Follow H&H. PCCM consulted and were following. They have now signed off.   Colonic ileus: Question role of severe constipation due to opioid use. The patient is on relistor. Abdomen continues to appear distended.  Acute hypoxic respiratory failure: The patient is currently saturating at 96% on room air. He is status post extubation 10/15/2019.   Acute systolic heart failure/non-STEMI/coronary artery disease 2000/NSVT: Compensated. Euvolemic. Blood pressure initially was stable however patient now noted th to be hypotensive on 0/17/5102 with systolic blood pressures in the 70s. Clonidine patch discontinued. IV beta-blocker decreased to 5 mg every 4 hours. Early on in the hospitalization patient noted to have a 5-6 beat run of nonsustained V. tach however asymptomatic on 11/04/2019. Potassium at 4.3. Magnesium at 2.2. Phosphorus at 4. IV beta-blocker dose had been increased to 10 mg every 4 hours per cardiology however due to hemorrhagic shock IV Lopressor has been decreased. If develops nonsustained V. tach may up-titrate Lopressor as tolerated by blood pressure back to 10 mg IV every 4 hours. Keep potassium >4, keep magnesium >2.   Left upper extremity DVT cephalic/axillary vein: Noted. Likely secondary to PICC line. IV heparin discontinued 11/08/2019 due to hemorrhagic shock secondary to large retroperitoneal hematoma.   Right upper extremity swelling and pain and discomfort: Heparin initially discontinued on 10/29/2019 after enlarging hematoma. Heparin was resumed without a bolus on 10/31/2019 after Dr. [name] discussed with a specialist. Right upper extremity discomfort initially felt to be secondary to a DVT however Dopplers negative.  Patient now with large retroperitoneal hematoma with hemorrhagic shock on 11/08/2019 and as such heparin discontinued. Patient likely not a candidate for anticoagulation. Supportive care.   Well-controlled diabetes mellitus type 2: Hemoglobin A1c 6.6. Blood glucose level of 112this morning. TPN discontinued.   Anemia of critical illness/acute blood loss anemia: Status post transfusion of 2 units packed red blood cells (10/25/2019). Patient underwent hemorrhagic shock on 11/08/2019 secondary to large retroperitoneal hematoma. Status post transfusion of 2 units packed red blood cells 11/08/2019 and 2 units of FFP. Hemoglobin at 6.7 on 11/09/2019 and patient transfused another 2 units of packed red blood cells hemoglobin currently at 9.0.Transfusion threshold hemoglobin <7.   Malignancy of the prostate: Noted. Follow up as outpatient.  Tobacco abuse: Tobacco cessation.   Diarrhea: Likely due to TPN. Per RN patient with voluminous loose stools noted in rectal pouch a6-7 days ago which has since improved. C. difficile PCR negative. KUB negative. IV antibiotics discontinued. 4 stools yesterday.  Lower extremity pain: Secondary to retroperitoneal hematoma.Clinical improvement with transfusion of packed red blood cells and FFP in addition to pain management. Continue Lidoderm patch, IV Robaxin as needed, IV Tylenol.   Prognosis: Patient with multiple comorbidities, complicated course during this hospitalization. Patient admitted with sepsis secondary to peritonitis, duodenal ulcer with perforation status post repair, complicated by non-STEMI, acute CHF. Patient with ongoing fistula currently n.p.o. on TPN, left upper extremity DVT placed on heparin however complicated by abdominal hematoma and now with large retroperitoneal hematoma leading to hemorrhagic shock requiring pressors and currently off pressors. Patient transfused total of 6 units packed red blood cells during this  hospitalization,as well as 2 units of FFP. Palliative care following. Patient with a poor prognosis. Family has now decided to make the patient DNR. The patient himself has expressed his desire to end aggressive interventions and to seek comfort and support for the remainder of his days.  After meeting with palliative care the patient's family agreed to make him palliative care. However, in the last week they have changed there mind. They now desire to continue to seek treatment, although with palliation as well. The patient is awaiting discharge to SNF with palliative care.  I have seen and examined this patient myself. I have spent 32 minutes in his evaluation and care.  DVT prophylaxis:SCDs Code Status:Full Family Communication:Updated patient and daughter at bedside. Disposition:The patient is from home. He is admitted as an inpatient. Anticipate discharge to residential hospice. Barriers to discharge include need to set the patient up for hospice at home and to get daughter Cardell Peach on board with the plan.  Status is: Inpatient  Dispo: Patient From: [place] Planned Disposition: LTAC vs SNF Expected discharge date: 11/27/19 Medically stable for discharge: Medically safe for discharge.   Cathyrn Deas, DO [facility] Direct contact: see [email or web address]  7PM-7AM contact night coverage as above 11/26/2019, 4:11 PM  LOS: 44 days

## 2019-11-26 NOTE — Progress Notes (Signed)
Daily Progress Note   Patient Name: Matthew Schwartz       Date: 11/26/2019 DOB: [birth year]  Age: 86 y.o. MRN#: [id number] Attending Physician: Karie Kirks, DO Primary Care Physician: [name], MD Admit Date: 10/12/2019  Reason for Consultation/Follow-up: Establishing goals of care  Subjective: I saw and examined Matthew Schwartz today.  He was not as awake and interactive as yesterday.  He did make eye contact and tried to voice a couple of words to me, but he certainly was more sleepy today during my encounter.  I met with his daughter, Matthew Schwartz, to discuss options for discharge and care plan moving forward.  Case management has been working to find options for skilled facility for discharge.  3 of these were presented to family and Matthew Schwartz is in the process of setting up appointments to go and look at them.  She reports that she is concerned by the fact that Matthew father is more sleepy today.  In addition, concern is that he has hemoglobin that has been trending downward over the past couple of days.  Both Matthew Schwartz and Matthew Schwartz (patient's wife) are concerned that he will be sent back to the hospital from skilled facility if his hemoglobin drops.  While patient's other daughter is still struggling with the fact that Matthew Schwartz remains very ill, patient's wife understands his expressed desire to avoid further prolonged hospitalization and family desires to work toward a plan to avoid future hospitalizations.  We talked again about options for care plan including trial of skilled facility for rehab versus consideration for hospice.  Discussed hospice benefits in detail including options for home hospice, hospice at long-term care facility, and residential hospice.  Based upon the fact that he appears to be less  interactive today and also has had downtrending hemoglobin (was 9.3 two days ago, 8.5 yesterday, and 8.1 today), I discussed with daughter at that he may in fact be appropriate for consideration for residential hospice.    Length of Stay: 45  Current Medications: Scheduled Meds:   acetaminophen  650 mg Oral TID   bisacodyl  10 mg Rectal Daily   chlorhexidine  15 mL Mouth Rinse BID   Chlorhexidine Gluconate Cloth  6 each Topical Daily   feeding supplement (PRO-STAT SUGAR FREE 64)  30 mL Oral BID   fentaNYL  1 patch Transdermal Q72H   lidocaine  2 patch Transdermal Q24H   LORazepam  1 mg Oral QHS   melatonin  10 mg Oral QHS   metoprolol tartrate  50 mg Oral BID   pantoprazole  40 mg Oral QHS   senna-docusate  1 tablet Oral QHS   simethicone  160 mg Oral QID   sodium chloride flush  10-40 mL Intracatheter Q12H   sodium chloride flush  5 mL Intracatheter Q8H    Continuous Infusions:  sodium chloride 250 mL (11/22/19 0004)   methocarbamol (ROBAXIN) IV 500 mg (11/17/19 0119)    PRN Meds: sodium chloride, bisacodyl, glycopyrrolate, HYDROmorphone (DILAUDID) injection, ipratropium-albuterol, lip balm, LORazepam, methocarbamol (ROBAXIN) IV, ondansetron (ZOFRAN) IV, prochlorperazine, silver nitrate applicators, sodium chloride flush  Physical Exam         Elderly male resting in bed Has some dependent edema, appears to be improving.  Regular work of breathing S1-S2 Abdomen  distended  Vital Signs: BP (!) 147/75 (BP Location: Left Arm)    Pulse 77    Temp (!) 97.5 F (36.4 C) (Oral)    Resp 18    Ht '5\' 11"'  (1.803 m)    Wt 98.9 kg    SpO2 97%    BMI 30.41 kg/m  SpO2: SpO2: 97 % O2 Device: O2 Device: Room Air O2 Flow Rate: O2 Flow Rate (L/min): 2 L/min  Intake/output summary:   Intake/Output Summary (Last 24 hours) at 11/26/2019 1859 Last data filed at 11/26/2019 1251 Gross per 24 hour  Intake --  Output 2500 ml  Net -2500 ml   LBM: Last BM Date:  11/26/19 Baseline Weight: Weight: 96.4 kg Most recent weight: Weight: 98.9 kg       Palliative Assessment/Data:    Flowsheet Rows     Most Recent Value  Intake Tab  Referral Department Hospitalist  Unit at Time of Referral ICU  Date Notified 10/23/19  Palliative Care Type New Palliative care  Reason for referral Clarify Goals of Care  Date of Admission 10/12/19  Date first seen by Palliative Care 10/24/19  # of days Palliative referral response time 1 Day(s)  # of days IP prior to Palliative referral 11  Clinical Assessment  Psychosocial & Spiritual Assessment  Palliative Care Outcomes     Palliative performance scale 30% Patient Active Problem List   Diagnosis Date Noted   Abscess    Nontraumatic retroperitoneal hematoma: 11/08/2019 11/09/2019   Hemorrhagic shock (HCC):11/08/2019 11/09/2019   Acute respiratory failure with hypoxemia (HCC)    Abdominal fluid collection    Anemia of chronic disease    Hypotension    Gastric ulcer    Hypokalemia    NSVT (nonsustained ventricular tachycardia) (HCC)    Diarrhea    Severe sepsis with septic shock (HCC)    NSTEMI (non-ST elevated myocardial infarction) (HCC)    Acute deep vein thrombosis (DVT) of left upper extremity (HCC)    Acute systolic CHF (congestive heart failure) (HCC)    Peritonitis (HCC)    Epigastric pain    Palliative care by specialist    Goals of care, counseling/discussion    General weakness    Acute abdominal pain    Bilateral atelectasis 10/17/2019   Acute respiratory insufficiency    Elevated troponin level    Acute respiratory failure with hypoxia (HCC)    PVCs (premature ventricular contractions)    Obstipation 10/12/2019   HLD (hyperlipidemia) 10/12/2019   Diabetes mellitus type 2, uncontrolled, with complications ([place]) 46/80/3212  Perforated bowel ([place]) 10/12/2019   Perforated duodenal ulcer ([place]) 10/12/2019   Anemia 09/07/2016   Coronary artery disease due  to lipid rich plaque 02/16/2015   Hyperlipidemia 02/16/2015   Malignant neoplasm of prostate ([place]) 02/18/2014   HTN (hypertension) 06/14/2013   Dyslipidemia 06/14/2013   GERD (gastroesophageal reflux disease) 06/14/2013    Palliative Care Assessment & Plan   Patient Profile:    Assessment: 86 year old gentleman with coronary artery disease status post percutaneous intervention in the year 2000, hypertension, dyslipidemia, diabetes who was admitted with perforated duodenal ulcer and peritonitis. Cardiology has been following for NSVT, intermittent left bundle branch block and anterolateral T wave inversions as well as elevated troponin, at some point is supposed to undergo cardiac catheterization after acute issues resolved. General surgery also following.  Has NG tube.  To continue TPN.  Plan for another upper GI this week. Frailty deconditioning  Constipation: resolved. BM on 7-25.  Generalized pain  Recommendations/Plan: Goals of care: Continue discussion with patient and his family about options for care moving forward.  He has had a very up-and-down course with periods where he looks like he is transitioning to acutely dying in periods of rallying and having improvement in his mental status and appetite.  Today, he appears to be sleepier than he was yesterday and is less interactive.  Additionally, he has continued decline in his hemoglobin.  Family is struggling with decision regarding options for skilled facility placement versus consideration for residential hospice.  His daughter is working to see facilities that have offered bed to him.  We will continue to follow while trending his hemoglobin and clinical course overall.  If he continues to improve, likely plan will be SNF with palliative care following.  If, however, he declines or fails to continue to improve, he may be better served by consideration for residential hospice.  Prognosis:   Guarded-he appears to be doing  worse again today.  Consider his prognosis may likely be limited to weeks at this point.  Discharge Planning: To be determined-rehab versus further consideration for residential hospice  Care plan was discussed with patient and daughter   Thank you for allowing the Palliative Medicine Team to assist in the care of this patient.   Total Time 45 Prolonged Time Billed  no    Greater than 50%  of this time was spent counseling and coordinating care related to the above assessment and plan.  Micheline Rough, MD  Please contact Palliative Medicine Team phone at [phone] for questions and concerns.

## 2019-11-26 NOTE — Progress Notes (Signed)
Referring Physician(s): Dr. [name]  Supervising Physician: Jacqulynn Cadet  Patient Status:  Matthew Schwartz - In-pt  Chief Complaint: Abdominal fluid collection  Subjective: Patient asleep in bed, male family member at the bedside. Patient arousable during my assessment but non-verbal, non-interactive.   Drain injection done 11/25/19 showed resolution of fistula and output has been minimal the past few days. Patient seen today to have drain removed.   Allergies: Losartan potassium  Medications: Prior to Admission medications   Medication Sig Start Date End Date Taking? Authorizing Provider  alfuzosin (UROXATRAL) 10 MG 24 hr tablet Take 10 mg by mouth daily. 09/07/19  Yes [provider]  amLODipine (NORVASC) 5 MG tablet Take 5 mg by mouth daily. 07/13/19  Yes [provider]  aspirin 81 MG tablet Take 81 mg by mouth every morning.    Yes [provider]  atorvastatin (LIPITOR) 40 MG tablet Take 40 mg by mouth at bedtime.    Yes [provider]  dicyclomine (BENTYL) 20 MG tablet Take 1 tablet (20 mg total) by mouth 2 (two) times daily. 10/11/19  Yes Nuala Alpha A, PA-C  famotidine (PEPCID) 20 MG tablet Take 20 mg by mouth 2 (two) times daily.   Yes [provider]  hydrocortisone valerate cream (WESTCORT) 0.2 % Apply 1 application topically 2 (two) times daily.  09/20/19  Yes [provider]  isosorbide mononitrate (IMDUR) 60 MG 24 hr tablet Take 60 mg by mouth every evening.   Yes [provider]  lactulose (CHRONULAC) 10 GM/15ML solution Take 10 g by mouth as needed for mild constipation.   Yes [provider]  metFORMIN (GLUCOPHAGE) 500 MG tablet Take 500 mg by mouth 2 (two) times daily. 09/20/19  Yes [provider]  metoprolol succinate (TOPROL-XL) 50 MG 24 hr tablet Take 50 mg by mouth 2 (two) times daily. 04/07/14  Yes [provider]  potassium chloride (KLOR-CON) 10 MEQ tablet Take 2 tablets  (20 mEq total) by mouth daily for 3 days. 10/11/19 10/14/19 Yes Nuala Alpha A, PA-C  PROCTOSOL HC 2.5 % rectal cream Place 1 application rectally daily as needed for hemorrhoids or itching.  11/06/13  Yes [provider]  ramipril (ALTACE) 10 MG capsule Take 10 mg by mouth 2 (two) times daily.    Yes [provider]  RESTASIS 0.05 % ophthalmic emulsion Place 1 drop into both eyes 2 (two) times daily.  09/09/19  Yes [provider]  senna-docusate (SENOKOT-S) 8.6-50 MG tablet Take 1 tablet by mouth daily. 10/09/19  Yes Lucrezia Starch, MD  [facility] injection Inject as directed once. 04/10/17   [provider]     Vital Signs: BP (!) 147/75 (BP Location: Left Arm)   Pulse 77   Temp (!) 97.5 F (36.4 C) (Oral)   Resp 18   Ht 5\' 11"  (1.803 m)   Wt 218 lb 0.6 oz (98.9 kg)   SpO2 97%   BMI 30.41 kg/m   Physical Exam Constitutional:      General: He is not in acute distress.    Appearance: He is ill-appearing.  Pulmonary:     Effort: Pulmonary effort is normal.  Abdominal:     Palpations: Abdomen is soft.     Comments: Drain intact, no output observed in gravity bag. Skin insertion site is clean and dry. No erythema, scant drainage at site.   Skin:    General: Skin is warm and dry.     Imaging: IR Sinus/Fist  Tube Chk-Non GI  Result Date: 11/25/2019 INDICATION: History of perforated duodenal ulcer and percutaneous catheter drainage right upper quadrant fluid collection. 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 EXAM: SINUS TRACT INJECTION/FISTULOGRAM MEDICATIONS: None ANESTHESIA/SEDATION: None CONTRAST:  10 mL Omnipaque 300 FLUOROSCOPY TIME:  12 seconds.  5.0 mGy. COMPLICATIONS: None immediate. PROCEDURE: In the pre-existing right upper quadrant drainage catheter was injected with contrast material under fluoroscopy. Fluoroscopic loops and images were saved. The drain was then gently flushed  with sterile saline and reconnected to a gravity drainage bag. FINDINGS: Contrast injection demonstrates no significant abscess cavity and reflux of contrast along the tract of the drain to the skin surface. There is no further evidence a fistula to the duodenal lumen as previously demonstrated. IMPRESSION: Resolution of demonstrable fistula to the lumen of the duodenum with contrast injection of the pre-existing right upper quadrant drainage catheter under fluoroscopy. The drain will be currently left in place as it is unclear what the exact amount of current daily fluid output is. Once output is minimal, the percutaneous drainage catheter will be removed and no further drain injection or CT is necessary unless there is a clinical change warranting reimaging. Electronically Signed   By: Aletta Edouard M.D.   On: 11/25/2019 12:32    Labs:  CBC: Recent Labs    11/15/19 0415 11/15/19 0415 11/16/19 0430 11/16/19 0430 11/18/19 1245 11/21/19 0610 11/24/19 1250 11/25/19 1553  WBC 10.7*  --  10.3  --  10.0  --  10.3  --   HGB 9.3*   < > 9.0*   < > 8.9* 9.3* 8.5* 8.1*  HCT 30.4*   < > 29.2*   < > 28.8* 29.5* 27.2* 25.8*  PLT 313  --  328  --  356  --  325  --    < > = values in this interval not displayed.    COAGS: Recent Labs    11/08/19 1525  INR 1.5*    BMP: Recent Labs    11/16/19 0430 11/18/19 1245 11/21/19 0610 11/25/19 1553  NA 142 139 134* 136  K 3.9 4.1 3.6 3.2*  CL 107 105 103 107  CO2 25 23 21* 24  GLUCOSE 124* 123* 109* 120*  BUN 35* 29* 20 13  CALCIUM 8.3* 8.2* 8.1* 6.9*  CREATININE 0.53* 0.74 0.64 0.37*  GFRNONAA >60 >60 >60 >60  GFRAA >60 >60 >60 >60    LIVER FUNCTION TESTS: Recent Labs    11/14/19 0600 11/15/19 0415 11/16/19 0430 11/18/19 1245  BILITOT 1.0 0.9 0.9 1.5*  AST 32 27 27 63*  ALT 57* 49* 42 78*  ALKPHOS 65 66 63 78  PROT 5.9* 6.6 6.4* 6.7  ALBUMIN 1.7* 1.9* 1.8* 1.9*    Assessment and Plan:  Right upper quadrant drain: Removed  today without complication. Drain output has been minimal the past few days. Drain injection done 11/25/19 showed resolution of the fistula. Gauze and taped placed over the site. Maintain dressing for 24 hours, longer if drainage present. Change dressing as needed. Do not submerge the site in water for 7 days. Please call IR with any questions or concerns.  IR will sign off.  Electronically Signed: Soyla Dryer, AGACNP-BC [phone] 11/26/2019, 12:04 PM   I spent a total of 15 Minutes at the the patient's bedside AND on the patient's hospital floor or unit, greater than 50% of which was counseling/coordinating care for right upper quadrant drain removal.

## 2019-11-26 NOTE — TOC Progression Note (Addendum)
Transition of Care [facility]) - Progression Note    Patient Details  Name: Matthew Schwartz MRN: [id number] Date of Birth: [birth year]  [facility]) CM/SW Contact  Leeroy Cha, RN Phone Number: 11/26/2019, 2:09 PM  Clinical Narrative:    Hanley Seamen the daughter a list of accpted snf she will speak to the family and get back to me. Spoke with Dr. Domingo Cocking and daughter patient is more lethargic today and hgb has dropped.  Daughter is investigating snf's actively.  May be more appropriate for resd. Hospice.  Expected Discharge Plan: [place] Barriers to Discharge: Continued Medical Work up  Expected Discharge Plan and Services Expected Discharge Plan: [place]   Discharge Planning Services: CM Consult   Living arrangements for the past 2 months: Single Family Home                                       Social Determinants of Health (SDOH) Interventions    Readmission Risk Interventions Readmission Risk Prevention Plan 11/09/2019  Transportation Screening Complete  Medication Review Press [employer]) Complete  HRI or [place] Complete  SW Recovery Care/Counseling Consult Complete  [place] Not Applicable  Some recent data might be hidden

## 2019-11-27 LAB — BASIC METABOLIC PANEL
Anion gap: 7 (ref 5–15)
BUN: 15 mg/dL (ref 8–23)
CO2: 23 mmol/L (ref 22–32)
Calcium: 7.3 mg/dL — ABNORMAL LOW (ref 8.9–10.3)
Chloride: 106 mmol/L (ref 98–111)
Creatinine, Ser: 0.49 mg/dL — ABNORMAL LOW (ref 0.61–1.24)
GFR calc Af Amer: >60 mL/min (ref >60)
GFR calc non Af Amer: >60 mL/min (ref >60)
Glucose, Bld: 173 mg/dL — ABNORMAL HIGH (ref 70–99)
Potassium: 2.9 mmol/L — ABNORMAL LOW (ref 3.5–5.1)
Sodium: 136 mmol/L (ref 135–145)

## 2019-11-27 LAB — CBC WITH DIFFERENTIAL/PLATELET
Abs Immature Granulocytes: 0.04 10*3/uL (ref 0.00–0.07)
Basophils Absolute: 0 10*3/uL (ref 0.0–0.1)
Basophils Relative: 1 %
Eosinophils Absolute: 0.2 10*3/uL (ref 0.0–0.5)
Eosinophils Relative: 3 %
HCT: 26.9 % — ABNORMAL LOW (ref 39.0–52.0)
Hemoglobin: 8.3 g/dL — ABNORMAL LOW (ref 13.0–17.0)
Immature Granulocytes: 1 %
Lymphocytes Relative: 14 %
Lymphs Abs: 0.9 10*3/uL (ref 0.7–4.0)
MCH: 26.5 pg (ref 26.0–34.0)
MCHC: 30.9 g/dL (ref 30.0–36.0)
MCV: 85.9 fL (ref 80.0–100.0)
Monocytes Absolute: 0.5 10*3/uL (ref 0.1–1.0)
Monocytes Relative: 8 %
Neutro Abs: 4.8 10*3/uL (ref 1.7–7.7)
Neutrophils Relative %: 73 %
Platelets: 360 10*3/uL (ref 150–400)
RBC: 3.13 MIL/uL — ABNORMAL LOW (ref 4.22–5.81)
RDW: 16.8 % — ABNORMAL HIGH (ref 11.5–15.5)
WBC: 6.5 10*3/uL (ref 4.0–10.5)
nRBC: 0 % (ref 0.0–0.2)

## 2019-11-27 LAB — SARS CORONAVIRUS 2 BY RT PCR (HOSPITAL ORDER, PERFORMED IN ~~LOC~~ HOSPITAL LAB): SARS Coronavirus 2: NEGATIVE

## 2019-11-27 MED ORDER — PANTOPRAZOLE SODIUM 40 MG PO TBEC: 40.0000 mg | DELAYED_RELEASE_TABLET | Freq: Every day | ORAL | 0 refills | Status: AC

## 2019-11-27 MED ORDER — GLYCOPYRROLATE 0.2 MG/ML IJ SOLN: 0.4000 mg | INTRAMUSCULAR | 0 refills | Status: AC | PRN

## 2019-11-27 MED ORDER — METHOCARBAMOL 750 MG PO TABS: 750.0000 mg | ORAL_TABLET | Freq: Four times a day (QID) | ORAL | 0 refills | Status: DC | PRN

## 2019-11-27 MED ORDER — LORAZEPAM 2 MG/ML PO CONC: 1.0000 mg | Freq: Every day | ORAL | 0 refills | Status: AC

## 2019-11-27 MED ORDER — LIP MEDEX EX OINT: TOPICAL_OINTMENT | CUTANEOUS | 0 refills | Status: AC | PRN

## 2019-11-27 MED ORDER — TRAZODONE HCL 50 MG PO TABS
25.0000 mg | ORAL_TABLET | Freq: Every evening | ORAL | Status: DC | PRN
  Administered 2019-11-27 – 2019-11-30 (×4): 25 mg via ORAL
  Filled 2019-11-27 (×5): qty 1

## 2019-11-27 MED ORDER — ACETAMINOPHEN 325 MG PO TABS: 650.0000 mg | ORAL_TABLET | Freq: Three times a day (TID) | ORAL | 0 refills | Status: AC

## 2019-11-27 MED ORDER — FENTANYL 25 MCG/HR TD PT72: 1.0000 | MEDICATED_PATCH | TRANSDERMAL | 0 refills | Status: AC

## 2019-11-27 MED ORDER — MAGNESIUM SULFATE 2 GM/50ML IV SOLN
2.0000 g | Freq: Once | INTRAVENOUS | Status: AC
  Administered 2019-11-27: 2 g via INTRAVENOUS
  Filled 2019-11-27: qty 50

## 2019-11-27 MED ORDER — BISACODYL 5 MG PO TBEC: 5.0000 mg | DELAYED_RELEASE_TABLET | Freq: Every day | ORAL | 0 refills | Status: AC | PRN

## 2019-11-27 MED ORDER — PRO-STAT SUGAR FREE PO LIQD: 30.0000 mL | Freq: Two times a day (BID) | ORAL | 0 refills | Status: AC

## 2019-11-27 MED ORDER — MORPHINE SULFATE 20 MG/5ML PO SOLN: 2.5000 mg | ORAL | 0 refills | Status: DC | PRN

## 2019-11-27 MED ORDER — IPRATROPIUM-ALBUTEROL 0.5-2.5 (3) MG/3ML IN SOLN: 3.0000 mL | RESPIRATORY_TRACT | 0 refills | Status: AC | PRN

## 2019-11-27 MED ORDER — POTASSIUM CHLORIDE 10 MEQ/100ML IV SOLN
10.0000 meq | INTRAVENOUS | Status: AC
  Administered 2019-11-27 (×4): 10 meq via INTRAVENOUS
  Filled 2019-11-27 (×4): qty 100

## 2019-11-27 MED ORDER — METOPROLOL TARTRATE 50 MG PO TABS: 50.0000 mg | ORAL_TABLET | Freq: Two times a day (BID) | ORAL | 0 refills | Status: DC

## 2019-11-27 MED ORDER — TRAZODONE HCL 50 MG PO TABS: 25.0000 mg | ORAL_TABLET | Freq: Every evening | ORAL | 0 refills | Status: AC | PRN

## 2019-11-27 MED ORDER — SODIUM CHLORIDE 0.9% FLUSH: 5.0000 mL | Freq: Three times a day (TID) | INTRAVENOUS | 0 refills | Status: AC

## 2019-11-27 MED ORDER — LIDOCAINE 5 % EX PTCH: 2.0000 | MEDICATED_PATCH | CUTANEOUS | 0 refills | Status: AC

## 2019-11-27 MED ORDER — BISACODYL 10 MG RE SUPP: 10.0000 mg | Freq: Every day | RECTAL | 0 refills | Status: AC

## 2019-11-27 MED ORDER — SIMETHICONE 80 MG PO CHEW: 160.0000 mg | CHEWABLE_TABLET | Freq: Four times a day (QID) | ORAL | 0 refills | Status: AC

## 2019-11-27 MED ORDER — ONDANSETRON HCL 4 MG/2ML IJ SOLN: 4.0000 mg | Freq: Four times a day (QID) | INTRAMUSCULAR | 0 refills | Status: AC | PRN

## 2019-11-27 MED ORDER — MELATONIN 10 MG PO TABS: 10.0000 mg | ORAL_TABLET | Freq: Every day | ORAL | 0 refills | Status: AC

## 2019-11-27 NOTE — TOC Progression Note (Addendum)
Transition of Care [facility]) - Progression Note    Patient Details  Name: Matthew Schwartz MRN: [id number] Date of Birth: 18-Jun-1932  Transition of Care [facility]) CM/SW Contact  Leeroy Cha, RN Phone Number: 11/27/2019, 9:45 AM  Clinical Narrative:    [place] # for [facility] care is [phone] good from today through [phone]/ reviews to be faxed to [phone] artis diaggis will be the case manager.     Spoke with Granddaughter mother is going to [facility] and [facility] this am to see which falciity he can go to. tct-[facility] no bed are available until next Tuesday will let the family be aware of this. tct-[facility] per family request-kathy-yes we have beds will need updated covid test. Daughter made aware of this. tct to [facility]-gave snf name [facility] for approval. Expected Discharge Plan: Skilled Nursing Facility Barriers to Discharge: Family Issues  Expected Discharge Plan and Services Expected Discharge Plan: [place]   Discharge Planning Services: CM Consult   Living arrangements for the past 2 months: Single Family Home                                       Social Determinants of Health (SDOH) Interventions    Readmission Risk Interventions Readmission Risk Prevention Plan 11/09/2019  Transportation Screening Complete  Medication Review Press photographer) Complete  HRI or [place] Complete  SW Recovery Care/Counseling Consult Complete  [place] Not Applicable  Some recent data might be hidden

## 2019-11-27 NOTE — Care Management Important Message (Signed)
Important Message  Patient Details IM Letter given to the Patient Name: Matthew Schwartz MRN: [id number] Date of Birth: 02/20/1933   Medicare Important Message Given:  Yes     [name] 11/27/2019, 11:45 AM

## 2019-11-27 NOTE — Discharge Summary (Signed)
Physician Discharge Summary  Matthew Schwartz WHQ:[id number] DOB: 02-05-1933 DOA: 10/12/2019  PCP: [name], MD  Admit date: 10/12/2019 Discharge date: 11/27/2019  Recommendations for Outpatient Follow-up:  1. Discharge to SNF   Follow-up Information    Coralie Keens, MD Follow up.   Specialty: General Surgery Why: call for follow up after discharge from the hospital or inpatient rehabilitation. Contact information: [place] [address] [phone]              Discharge Diagnoses: Principal diagnosis is #1 1. Duodenal ulcer with perforation and peritonitis 2. Left upper extremity DVT 3. Acute blood loss anemia due to large retroperitoneal bleed. 4. Hemorrhagic shock 5. Prostate cancer 6. Constipation 7. Anemia of critical illness 8. Systolic CHF acute on chronic 9. NSVT 10. NSTEMI  Discharge Condition: Stable  Disposition: SNF  Diet recommendation: Regular, comfort feeds  Filed Weights   11/15/19 0444 11/16/19 0500 11/17/19 0852  Weight: 95.5 kg 94.5 kg 98.9 kg    History of present illness: As per Dr. Dia Crawford Matthew Schwartz is a 86 y.o. WM  who was seen in the ED yesterday morning for abdominal pain. The abdominal pain has been present for 4 to 5 days and was described as a generalized cramping and sharp sensation. Nothing made it better or worse. He had associated nausea and one episode of vomiting. He had been seen in the ED 2 days earlier for similar complaints. CT of the abdomen and pelvis on 10/09/2019 showed no acute intra-abdominal process and a chronic appearing renal mass. CT angiogram of the abdomen and pelvis performed yesterday showed no evidence of bowel ischemia. He was treated with IV fluids, Zofran, morphine and Dilaudid and discharged home yesterday evening after demonstrating ability to drink without vomiting. The hospitalist was consulted but decided the patient did not need admission at that time and he was  discharged home about 5:41 PM.  The patient returns with complaints of continued abdominal pain, nausea and vomiting. He reports vomiting about 10 times since discharge. He rates his abdominal pain is an 8 out of 10, aching in nature with also cramping. He feels that his abdomen is distended and feels like he needs to have a bowel movement, even requesting an enema, even though his CT scan shows minimal stool in the descending and sigmoid colon.  He has no history of cardiacrhythm problemsproblems(he did have angioplasty in 2000)and sees his cardiologist annually. His EKG yesterday showed ventricular bigeminy and his rhythm strip shows frequent ectopy and frequent runs of tachycardia without obvious P waves; these runs have some irregularity of rhythm suggestive of paroxysmal atrial fibrillation and the QRS complexes are approximately the same with as his atrially paced complexes.  Hospital Course:  [name] NIDDM, HLD, GERD, irritable bowel syndrome (constipation) status post left knee replacement 2009, PTCA 2000-->RCA well as prior prostate CA, former smoker Admit 10/12/2019 severe abdominal pain-ex lap 10/12/2019-perforated duodenal ulcer and remained on vent NSVT postop-echo showed Systolic HF--was on pressors and cardiology consulted-eventually extubated 6/17 + fevers 6/23= postoperative abscess-IR Placed abd perc drain 6/24--growing Candida /on diflucan On 6/30 upper GI series were performed-and repeated Unfortunately spiked a fever without cause-found to have on CT 7/1 enlarging hematoma General surgery felt this was minimal and not the cause for his fever He has not had high-grade fever since his Antibiotics were adjusted 7/1 vancomycin and Primaxin but continues to have low-grade Heparin was held temporarily until 7/3 and restarted cautiously 7/3  after risk-benefit discussion with family  He has stabilized to some degree on stepdown unit he has had some pain and  swelling in his upper extremities likely from the prior DVT in his left upper extremity-his right arm was imaged for DVT and found to be negative He has a long road ahead and TOC is working with patient and family to figure out next steps in terms of placement he may be able to eventually go to an LTAC.  On the morning of 11/25/2019 the patient went for follow-up fistulogram. 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 That drain will currently left in place as it is unclear what the exact amount of current daily fluid output is. Once the output is minimal, the percutaneous drainage catheter has now been removed and no further drain injection of CT is necessary unless there is a clinical change warranting re-imaging.  TOC is seeking SNF placement.  Today's assessment: S: The patient is resting comfortably. No new complaints. O: Vitals:  Vitals:   11/26/19 1941 11/27/19 0452  BP: (!) 149/72 (!) 148/76  Pulse: 72 74  Resp: 20 18  Temp:  (!) 97.3 F (36.3 C)  SpO2: 99% 95%  Constitutional:   The patient is awake, alert, but non-verbal.  No acute distress. He appears chronically ill, fatigued and weak. Respiratory:   No increased work of breathing.  No wheezes, rales, or rhonchi  No tactile fremitus Cardiovascular:   Regular rate and rhythm  No murmurs, ectopy, or gallups.  No lateral PMI. No thrills. Abdomen:   Abdomen is soft, non-tender, non-distended  No hernias, masses, or organomegaly  Normoactive bowel sounds.  Musculoskeletal:   No cyanosis, clubbing, or edema Skin:   No rashes, lesions, ulcers  palpation of skin: no induration or nodules Neurologic:   CN 2-12 intact  Sensation all 4 extremities intact Psychiatric:   Mental status ? Mood, affect appropriate Orientation to person, place, time  Discharge  Instructions  Discharge Instructions    Activity as tolerated - No restrictions   Complete by: As directed    Diet general   Complete by: As directed    Discharge instructions   Complete by: As directed    Discharge to SNF   Increase activity slowly   Complete by: As directed    No wound care   Complete by: As directed      Allergies as of 11/27/2019      Reactions   Losartan Potassium Other (See Comments)   Unknown reaction that happened 15 years ago      Medication List    STOP taking these medications   alfuzosin 10 MG 24 hr tablet Commonly known as: UROXATRAL   amLODipine 5 MG tablet Commonly known as: NORVASC   aspirin 81 MG tablet   atorvastatin 40 MG tablet Commonly known as: LIPITOR   dicyclomine 20 MG tablet Commonly known as: BENTYL   famotidine 20 MG tablet Commonly known as: PEPCID   hydrocortisone valerate cream 0.2 % Commonly known as: WESTCORT   isosorbide mononitrate 60 MG 24 hr tablet Commonly known as: IMDUR   lactulose 10 GM/15ML solution Commonly known as: CHRONULAC   metFORMIN 500 MG tablet Commonly known as: GLUCOPHAGE   metoprolol succinate 50 MG 24 hr tablet Commonly known as: TOPROL-XL   potassium chloride 10 MEQ tablet Commonly known as: KLOR-CON   ramipril 10  MG capsule Commonly known as: ALTACE   Shingrix injection Generic drug: Zoster Vaccine Adjuvanted     TAKE these medications   acetaminophen 325 MG tablet Commonly known as: TYLENOL Take 2 tablets (650 mg total) by mouth 3 (three) times daily.   bisacodyl 5 MG EC tablet Commonly known as: DULCOLAX Take 1 tablet (5 mg total) by mouth daily as needed for moderate constipation.   bisacodyl 10 MG suppository Commonly known as: DULCOLAX Place 1 suppository (10 mg total) rectally daily. Start taking on: November 28, 2019   feeding supplement (PRO-STAT SUGAR FREE 64) Liqd Take 30 mLs by mouth 2 (two) times daily.   fentaNYL 25 MCG/HR Commonly known as:  [place] 1 patch onto the skin every 3 (three) days. Start taking on: November 28, 2019   glycopyrrolate 0.2 MG/ML injection Commonly known as: ROBINUL Inject 2 mLs (0.4 mg total) into the vein every 4 (four) hours as needed (secretions).   ipratropium-albuterol 0.5-2.5 (3) MG/3ML Soln Commonly known as: DUONEB Take 3 mLs by nebulization every 2 (two) hours as needed.   lidocaine 5 % Commonly known as: LIDODERM Place 2 patches onto the skin daily. Remove & Discard patch within 12 hours or as directed by MD   lip balm ointment Apply topically as needed for lip care.   LORazepam 2 MG/ML concentrated solution Commonly known as: ATIVAN Take 0.5 mLs (1 mg total) by mouth at bedtime.   Melatonin 10 MG Tabs Take 10 mg by mouth at bedtime.   methocarbamol 750 MG tablet Commonly known as: ROBAXIN Take 1 tablet (750 mg total) by mouth every 6 (six) hours as needed for muscle spasms.   metoprolol tartrate 50 MG tablet Commonly known as: LOPRESSOR Take 1 tablet (50 mg total) by mouth 2 (two) times daily.   morphine 20 MG/5ML solution Take 0.6-1.3 mLs (2.4-5.2 mg total) by mouth every 2 (two) hours as needed for pain.   ondansetron 4 MG/2ML Soln injection Commonly known as: ZOFRAN Inject 2 mLs (4 mg total) into the vein every 6 (six) hours as needed for nausea or vomiting.   pantoprazole 40 MG tablet Commonly known as: PROTONIX Take 1 tablet (40 mg total) by mouth at bedtime.   Proctosol HC 2.5 % rectal cream Generic drug: hydrocortisone Place 1 application rectally daily as needed for hemorrhoids or itching.   Restasis 0.05 % ophthalmic emulsion Generic drug: cycloSPORINE Place 1 drop into both eyes 2 (two) times daily.   senna-docusate 8.6-50 MG tablet Commonly known as: Senokot-S Take 1 tablet by mouth daily.   simethicone 80 MG chewable tablet Commonly known as: MYLICON Chew 2 tablets (160 mg total) by mouth 4 (four) times daily.   sodium chloride flush 0.9 %  Soln Commonly known as: NS 5 mLs by Intracatheter route every 8 (eight) hours.   traZODone 50 MG tablet Commonly known as: DESYREL Take 0.5 tablets (25 mg total) by mouth at bedtime as needed for sleep.      Allergies  Allergen Reactions  . Losartan Potassium Other (See Comments)    Unknown reaction that happened 15 years ago    The results of significant diagnostics from this hospitalization (including imaging, microbiology, ancillary and laboratory) are listed below for reference.    Significant Diagnostic Studies: DG Chest 1 View  Result Date: 11/11/2019 CLINICAL DATA:  Congestion of upper respiratory tract. EXAM: CHEST  1 VIEW COMPARISON:  November 03, 2019. FINDINGS: Stable cardiomediastinal silhouette. Nasogastric tube has been removed. No pneumothorax is  noted. Stable left lingular subsegmental atelectasis is noted. Right lung is clear. Left-sided PICC line is again noted with tip in right atrium. Bony thorax is unremarkable. IMPRESSION: Stable left lingular subsegmental atelectasis. Nasogastric tube has been removed. Electronically Signed   By: Marijo Conception M.D.   On: 11/11/2019 09:21   DG Chest 2 View  Result Date: 10/31/2019 CLINICAL DATA:  Pneumonia EXAM: CHEST - 2 VIEW COMPARISON:  Chest radiograph dated 10/29/2019 FINDINGS: A left upper extremity peripherally inserted central venous catheter tip overlies the superior cavoatrial junction, unchanged. An enteric tube enters the stomach and terminates below the field of view. A pigtail catheter overlies the right upper quadrant, unchanged. The heart size and mediastinal contours are within normal limits. Mild atelectasis in both mid lungs is not significantly changed. There is no pleural effusion or pneumothorax. The visualized skeletal structures are unremarkable. IMPRESSION: Unchanged mild bilateral mid lung atelectasis. Electronically Signed   By: Zerita Boers M.D.   On: 10/31/2019 10:18   DG Abd 1 View  Result Date:  11/11/2019 CLINICAL DATA:  Abdominal pain. EXAM: ABDOMEN - 1 VIEW COMPARISON:  CT 11/08/2019 FINDINGS: Pigtail catheter in the right upper quadrant. Diffuse gaseous colonic distension. Suspected mild small bowel dilatation. No evidence of free air. Prostate surgical clips. IMPRESSION: Diffuse gaseous colonic distension suggesting colonic ileus. Few prominent small bowel loops are also seen in the lower abdomen. Electronically Signed   By: Keith Rake M.D.   On: 11/11/2019 23:45   DG Abd 1 View  Result Date: 11/06/2019 CLINICAL DATA:  Acute abdominal pain and distention. EXAM: ABDOMEN - 1 VIEW COMPARISON:  None. FINDINGS: The bowel gas pattern is normal. Pigtail drainage catheter is noted in right upper quadrant. No radio-opaque calculi or other significant radiographic abnormality are seen. IMPRESSION: No evidence of bowel obstruction or ileus. Electronically Signed   By: Marijo Conception M.D.   On: 11/06/2019 12:00   CT ABDOMEN PELVIS W CONTRAST  Result Date: 11/08/2019 CLINICAL DATA:  Abdominal pain. EXAM: CT ABDOMEN AND PELVIS WITH CONTRAST TECHNIQUE: Multidetector CT imaging of the abdomen and pelvis was performed using the standard protocol following bolus administration of intravenous contrast. CONTRAST:  145mL OMNIPAQUE IOHEXOL 300 MG/ML  SOLN COMPARISON:  October 29, 2019 FINDINGS: Lower chest: There are a few small pulmonary nodules at the lung bases. Atelectasis is noted. There are trace bilateral pleural effusions. The band coronary artery calcifications are noted. Hepatobiliary: The liver is normal. The gallbladder is distended as before. There is dilatation of the common bile duct.There is no biliary ductal dilation. Pancreas: Normal contours without ductal dilatation. No peripancreatic fluid collection. Spleen: Spleen is unremarkable. There is a small collection adjacent to the upper pole of the spleen which has decreased in size from prior study. Adrenals/Urinary Tract: --Adrenal glands:  Unremarkable. --Right kidney/ureter: No hydronephrosis or radiopaque kidney stones. --Left kidney/ureter: No hydronephrosis or radiopaque kidney stones. --Urinary bladder: Unremarkable. Stomach/Bowel: --Stomach/Duodenum: No hiatal hernia or other gastric abnormality. Normal duodenal course and caliber. --Small bowel: Unremarkable. --Colon: There are scattered diverticula without CT evidence for diverticulitis. --Appendix: Not visualized. No right lower quadrant inflammation or free fluid. Vascular/Lymphatic: Atherosclerotic calcification is present within the non-aneurysmal abdominal aorta, without hemodynamically significant stenosis. --No retroperitoneal lymphadenopathy. --No mesenteric lymphadenopathy. --No pelvic or inguinal lymphadenopathy. Reproductive: There are few brachytherapy beads within the prostate gland Other: There is a new large retroperitoneal hematoma measuring approximately 14 x 15 by 15.5 cm. There is a hematocrit level within this retroperitoneal hematoma.  There is evidence for active bleeding within the right psoas muscle, new since prior study. There is a percutaneous drain in the right upper quadrant. There is suggestion of a small fistulous connection to the duodenal bulb as was demonstrated on the patient's recent fistulogram. There is a trace amount of free fluid in the upper abdomen. Musculoskeletal. There is a persistent hematoma in the right rectus muscle which has decreased in size since the prior study. There is no acute displaced fracture. IMPRESSION: 1. New large right-sided retroperitoneal hematoma with evidence for active bleeding within the right psoas muscle. 2. Persistent but decreased size of the right rectus muscle hematoma. 3. Distended gallbladder with dilatation of the common bile duct. Correlation with laboratory studies is recommended. If there is concern for acute cholecystitis, follow-up with ultrasound is recommended. 4. Stable positioning of the percutaneous drain  in the right upper quadrant with a probable fistulous connection to the duodenal bulb. Aortic Atherosclerosis (ICD10-I70.0). These results were called by telephone at the time of interpretation on 11/08/2019 at 3:26 pm to provider [facility] , who verbally acknowledged these results. Electronically Signed   By: Constance Holster M.D.   On: 11/08/2019 15:27   CT ABDOMEN PELVIS W CONTRAST  Result Date: 10/29/2019 CLINICAL DATA:  Possible abscess. EXAM: CT ABDOMEN AND PELVIS WITH CONTRAST TECHNIQUE: Multidetector CT imaging of the abdomen and pelvis was performed using the standard protocol following bolus administration of intravenous contrast. CONTRAST:  151mL OMNIPAQUE IOHEXOL 300 MG/ML  SOLN COMPARISON:  October 21, 2019. FINDINGS: Lower chest: Mild bilateral posterior basilar subsegmental atelectasis is noted. Hepatobiliary: No cholelithiasis is noted. Mild gallbladder distention is noted. Stable dilatation of common bile duct is noted. No focal abnormality is noted in the liver. Pancreas: Unremarkable. No pancreatic ductal dilatation or surrounding inflammatory changes. Spleen: Normal in size without focal abnormality. Adrenals/Urinary Tract: Adrenal glands appear normal. Stable bilateral renal cysts are noted. No hydronephrosis or renal obstruction is noted. No renal or ureteral calculi are noted. Urinary bladder is unremarkable. Stomach/Bowel: Nasogastric tube tip is seen in proximal stomach. There is no evidence of bowel obstruction or inflammation. The appendix appears normal. Vascular/Lymphatic: Aortic atherosclerosis. No enlarged abdominal or pelvic lymph nodes. Reproductive: Stable postsurgical changes are noted in the prostate gland. Other: Anterior midline surgical incision is again noted. Large irregular high density is noted in this area most consistent with enlarging subcutaneous or intramuscular hematoma. There is been interval placement of pigtail drainage catheter in the area of fluid  collection noted on prior exam. Fluid collection is significantly decompressed compared to prior exam. Musculoskeletal: No acute or significant osseous findings. IMPRESSION: 1. Anterior midline surgical incision is again noted. Large irregular high density is noted in this area most consistent with enlarging subcutaneous or intramuscular hematoma. 2. There has been interval placement of pigtail drainage catheter in the area of fluid collection noted on prior exam in the right upper quadrant. Fluid collection is significantly decompressed compared to prior exam. 3. Stable dilatation of common bile duct is noted. 4. Stable bilateral renal cysts are noted. Aortic Atherosclerosis (ICD10-I70.0). Electronically Signed   By: Marijo Conception M.D.   On: 10/29/2019 12:44   IR Sinus/Fist Tube Chk-Non GI  Result Date: 11/25/2019 INDICATION: History of perforated duodenal ulcer and percutaneous catheter drainage right upper quadrant fluid collection. 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 EXAM: SINUS TRACT INJECTION/FISTULOGRAM MEDICATIONS: None ANESTHESIA/SEDATION: None CONTRAST:  10 mL Omnipaque 300 FLUOROSCOPY TIME:  12 seconds.  5.0 mGy. COMPLICATIONS: None immediate. PROCEDURE: In the pre-existing right upper quadrant drainage catheter was injected with contrast material under fluoroscopy. Fluoroscopic loops and images were saved. The drain was then gently flushed with sterile saline and reconnected to a gravity drainage bag. FINDINGS: Contrast injection demonstrates no significant abscess cavity and reflux of contrast along the tract of the drain to the skin surface. There is no further evidence a fistula to the duodenal lumen as previously demonstrated. IMPRESSION: Resolution of demonstrable fistula to the lumen of the duodenum with contrast injection of the pre-existing right upper quadrant drainage catheter under fluoroscopy. The drain will be currently left  in place as it is unclear what the exact amount of current daily fluid output is. Once output is minimal, the percutaneous drainage catheter will be removed and no further drain injection or CT is necessary unless there is a clinical change warranting reimaging. Electronically Signed   By: Aletta Edouard M.D.   On: 11/25/2019 12:32   IR Sinus/Fist Tube Chk-Non GI  Result Date: 11/16/2019 CLINICAL DATA:  Perforated duodenal ulcer, with right upper quadrant postop abscess, status post percutaneous drain catheter placement 10/22/2019. Fistula to the duodenum had been previously demonstrated on injections. EXAM: ABSCESS DRAIN CATHETER INJECTION UNDER FLUOROSCOPY TECHNIQUE: The procedure, risks (including but not limited to bleeding, infection, organ damage), benefits, and alternatives were explained to the patient. Questions regarding the procedure were encouraged and answered. The patient understands and consents to the procedure. Survey fluoroscopic inspection reveals stable position of right upper quadrant pigtail drain catheter. Injection demonstrates resolution of abscess cavity but persistent small fistula to the duodenal lumen. IMPRESSION: 1. 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  Drain catheter left in place. Electronically Signed   By: Lucrezia Europe M.D.   On: 11/16/2019 08:04   IR Sinus/Fist Tube Chk-Non GI  Result Date: 10/30/2019 INDICATION: 86 year old with history of repaired perforated duodenal ulcer. Patient continues to have a small amount of purulent output from the right upper quadrant drain. EXAM: SINUS TRACT INJECTION / FISTULOGRAM COMPARISON:  None. MEDICATIONS: None ANESTHESIA/SEDATION: None COMPLICATIONS: None immediate. TECHNIQUE: Patient was placed supine on the fluoroscopic table. Drain was injected with contrast. Subsequently, the drain was flushed with normal saline and attached to a gravity bag. PROCEDURE: Drain was injected with approximately 15 mL Omnipaque 300. FINDINGS: Pigtail catheter  in the right upper abdomen. 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 Small irregular cavities around the drain but no large abscess cavity. 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 Drain was switched from a suction bulb to a gravity bag. Contrast fills small irregular cavities around the drain but no large abscess cavity. Electronically Signed   By: Markus Daft M.D.   On: 10/30/2019 17:20   DG Sinus/Fist Tube Chk-Non GI  Result Date: 11/06/2019 INDICATION: Status post drainage of abdominal abscess. EXAM: Injection of drainage catheter in right upper quadrant. MEDICATIONS: The patient is currently admitted to the hospital and receiving intravenous antibiotics. The antibiotics were administered within an appropriate time frame prior to the initiation of the procedure. ANESTHESIA/SEDATION: None. COMPLICATIONS: None immediate. PROCEDURE: Under fluoroscopy, contrast was injected through pre-existing catheter in the right upper quadrant. This demonstrates filling of fistula extending into proximal duodenum with filling of the duodenum indicating patency. IMPRESSION: Upon injection of pigtail drainage catheter in right upper quadrant, filling of proximal duodenum is noted consistent with patent fistula or connection with drained abscess. Electronically Signed   By: Jeneen Rinks  Murlean Caller M.D.   On: 11/06/2019 15:58   DG CHEST PORT 1 VIEW  Result Date: 11/03/2019 CLINICAL DATA:  Aspiration. EXAM: PORTABLE CHEST 1 VIEW COMPARISON:  October 31, 2019. FINDINGS: The heart size and mediastinal contours are within normal limits. Both lungs are clear. Nasogastric tube is seen entering stomach. The visualized skeletal structures are unremarkable. IMPRESSION: No active disease. Electronically Signed   By: Marijo Conception M.D.   On: 11/03/2019 15:38   DG Chest Port 1 View  Result Date: 10/29/2019 CLINICAL DATA:  Fever EXAM: PORTABLE CHEST 1 VIEW COMPARISON:  10/26/2019 FINDINGS: Areas of  atelectasis in the mid lungs bilaterally. Heart is borderline in size. No effusions. No acute bony abnormality. NG tube and left PICC line remain in place, unchanged. IMPRESSION: Areas of atelectasis bilaterally.  Mild cardiomegaly. Electronically Signed   By: Rolm Baptise M.D.   On: 10/29/2019 19:41   DG UGI W SINGLE CM (SOL OR THIN BA)  Result Date: 11/03/2019 CLINICAL DATA:  86 year old male with history of perforated duodenal ulcer. EXAM: WATER SOLUBLE UPPER GI SERIES TECHNIQUE: Single-column upper GI series was performed using water soluble contrast. CONTRAST:  142mL OMNIPAQUE IOHEXOL 300 MG/ML  SOLN COMPARISON:  Upper GI 10/28/2019. FLUOROSCOPY TIME:  Fluoroscopy Time:  2 minutes and 36 seconds Radiation Exposure Index (if provided by the fluoroscopic device): 33.3 mGy FINDINGS: 125 mL of Omnipaque 300 was injected via the indwelling nasogastric tube. Upon adequate filling of the stomach, the patient was position in a RPO position allowing drainage through the pylorus into the duodenum. Upon repeated pyloric emptying, a small pool of contrast material was noted to be persistent in the region of the duodenum, which may represent some irregularity at the site of duodenal repair. However, at no time during the examination was there is extravasation of contrast material. Specifically, no definite residual fistulous tract was identified between the duodenal bulb and the adjacent pigtail drainage catheter. IMPRESSION: 1. Mild irregularity of the duodenal bulb likely related to prior surgical repair. No definite persistent leak identified on today's examination. Electronically Signed   By: Vinnie Langton M.D.   On: 11/03/2019 10:33   VAS [place] UPPER EXTREMITY VENOUS DUPLEX  Result Date: 11/02/2019 UPPER VENOUS STUDY  Indications: Pain, and Swelling Anticoagulation: Heparin. Limitations: Line and patient pain tolerance, patient positioning. Comparison Study: 10/17/2019 - Left Axillary DVT Performing Technologist:  Oliver Hum RVT  Examination Guidelines: A complete evaluation includes B-mode imaging, spectral Doppler, color Doppler, and power Doppler as needed of all accessible portions of each vessel. Bilateral testing is considered an integral part of a complete examination. Limited examinations for reoccurring indications may be performed as noted.  Right Findings: +----------+------------+---------+-----------+----------+-------+ RIGHT     CompressiblePhasicitySpontaneousPropertiesSummary +----------+------------+---------+-----------+----------+-------+ IJV           Full       Yes       Yes                      +----------+------------+---------+-----------+----------+-------+ Subclavian    Full       Yes       Yes                      +----------+------------+---------+-----------+----------+-------+ Axillary      Full       Yes       Yes                      +----------+------------+---------+-----------+----------+-------+  Brachial      Full       Yes       Yes                      +----------+------------+---------+-----------+----------+-------+ Radial        Full                                          +----------+------------+---------+-----------+----------+-------+ Ulnar         Full                                          +----------+------------+---------+-----------+----------+-------+ Cephalic      Full                                          +----------+------------+---------+-----------+----------+-------+ Basilic       Full                                          +----------+------------+---------+-----------+----------+-------+  Left Findings: +----------+------------+---------+-----------+----------+-------+ LEFT      CompressiblePhasicitySpontaneousPropertiesSummary +----------+------------+---------+-----------+----------+-------+ Subclavian    Full       Yes       Yes                       +----------+------------+---------+-----------+----------+-------+  Summary:  Right: No evidence of deep vein thrombosis in the upper extremity. No evidence of superficial vein thrombosis in the upper extremity.  Left: No evidence of thrombosis in the subclavian.  *See table(s) above for measurements and observations.  Diagnosing physician: Monica Martinez MD Electronically signed by Monica Martinez MD on 11/02/2019 at 4:49:26 PM.    Final     Microbiology: No results found for this or any previous visit (from the past 240 hour(s)).   Labs: Basic Metabolic Panel: Recent Labs  Lab 11/21/19 0610 11/25/19 1553 11/27/19 1146  NA 134* 136 136  K 3.6 3.2* 2.9*  CL 103 107 106  CO2 21* 24 23  GLUCOSE 109* 120* 173*  BUN 20 13 15   CREATININE 0.64 0.37* 0.49*  CALCIUM 8.1* 6.9* 7.3*   Liver Function Tests: No results for input(s): AST, ALT, ALKPHOS, BILITOT, PROT, ALBUMIN in the last 168 hours. No results for input(s): LIPASE, AMYLASE in the last 168 hours. No results for input(s): AMMONIA in the last 168 hours. CBC: Recent Labs  Lab 11/21/19 0610 11/24/19 1250 11/25/19 1553 11/27/19 1146  WBC  --  10.3  --  6.5  NEUTROABS  --   --   --  4.8  HGB 9.3* 8.5* 8.1* 8.3*  HCT 29.5* 27.2* 25.8* 26.9*  MCV  --  86.9  --  85.9  PLT  --  325  --  360   Cardiac Enzymes: No results for input(s): CKTOTAL, CKMB, CKMBINDEX, TROPONINI in the last 168 hours. BNP: BNP (last 3 results) No results for input(s): BNP in the last 8760 hours.  ProBNP (last 3 results) No results for input(s): PROBNP in the last 8760 hours.  CBG: No results for input(s): GLUCAP in the  last 168 hours.  Principal Problem:   Severe sepsis with septic shock (HCC) Active Problems:   HTN (hypertension)   Malignant neoplasm of prostate (HCC)   Hyperlipidemia   Obstipation   HLD (hyperlipidemia)   Diabetes mellitus type 2, uncontrolled, with complications (HCC)   Perforated bowel ([place])   Perforated duodenal  ulcer ([place])   Acute respiratory insufficiency   Elevated troponin level   Acute respiratory failure with hypoxia (HCC)   PVCs (premature ventricular contractions)   Bilateral atelectasis   Acute abdominal pain   Palliative care by specialist   Goals of care, counseling/discussion   General weakness   Epigastric pain   Gastric ulcer   Hypokalemia   NSVT (nonsustained ventricular tachycardia) (HCC)   Diarrhea   NSTEMI (non-ST elevated myocardial infarction) ([place])   Acute deep vein thrombosis (DVT) of left upper extremity (HCC)   Acute systolic CHF (congestive heart failure) (HCC)   Peritonitis (HCC)   Anemia of chronic disease   Hypotension   Nontraumatic retroperitoneal hematoma: 11/08/2019   Hemorrhagic shock (HCC):11/08/2019   Acute respiratory failure with hypoxemia (HCC)   Abdominal fluid collection   Abscess  Time coordinating discharge: 38 minutes  Signed:        Other Atienza, DO [facility]  11/27/2019, 2:36 PM

## 2019-11-28 ENCOUNTER — Inpatient Hospital Stay: Payer: Self-pay

## 2019-11-28 LAB — BASIC METABOLIC PANEL
Anion gap: 6 (ref 5–15)
BUN: 13 mg/dL (ref 8–23)
CO2: 25 mmol/L (ref 22–32)
Calcium: 7.9 mg/dL — ABNORMAL LOW (ref 8.9–10.3)
Chloride: 103 mmol/L (ref 98–111)
Creatinine, Ser: 0.54 mg/dL — ABNORMAL LOW (ref 0.61–1.24)
GFR calc Af Amer: >60 mL/min (ref >60)
GFR calc non Af Amer: >60 mL/min (ref >60)
Glucose, Bld: 146 mg/dL — ABNORMAL HIGH (ref 70–99)
Potassium: 3.8 mmol/L (ref 3.5–5.1)
Sodium: 134 mmol/L — ABNORMAL LOW (ref 135–145)

## 2019-11-28 LAB — CBC WITH DIFFERENTIAL/PLATELET
Abs Immature Granulocytes: 0.03 10*3/uL (ref 0.00–0.07)
Basophils Absolute: 0 10*3/uL (ref 0.0–0.1)
Basophils Relative: 0 %
Eosinophils Absolute: 0.2 10*3/uL (ref 0.0–0.5)
Eosinophils Relative: 4 %
HCT: 27.1 % — ABNORMAL LOW (ref 39.0–52.0)
Hemoglobin: 8.4 g/dL — ABNORMAL LOW (ref 13.0–17.0)
Immature Granulocytes: 1 %
Lymphocytes Relative: 12 %
Lymphs Abs: 0.8 10*3/uL (ref 0.7–4.0)
MCH: 26.8 pg (ref 26.0–34.0)
MCHC: 31 g/dL (ref 30.0–36.0)
MCV: 86.6 fL (ref 80.0–100.0)
Monocytes Absolute: 0.6 10*3/uL (ref 0.1–1.0)
Monocytes Relative: 10 %
Neutro Abs: 4.8 10*3/uL (ref 1.7–7.7)
Neutrophils Relative %: 73 %
Platelets: 364 10*3/uL (ref 150–400)
RBC: 3.13 MIL/uL — ABNORMAL LOW (ref 4.22–5.81)
RDW: 17.1 % — ABNORMAL HIGH (ref 11.5–15.5)
WBC: 6.5 10*3/uL (ref 4.0–10.5)
nRBC: 0 % (ref 0.0–0.2)

## 2019-11-28 LAB — MAGNESIUM: Magnesium: 1.9 mg/dL (ref 1.7–2.4)

## 2019-11-28 NOTE — Progress Notes (Signed)
Daily Progress Note   Patient Name: Matthew Schwartz       Date: 11/28/2019 DOB: [birth year]  Age: 86 y.o. MRN#: [id number] Attending Physician: Acquanetta Chain, DO Primary Care Physician: [name], MD Admit Date: 10/12/2019  Reason for Consultation/Follow-up: Establishing goals of care  Subjective: I saw and examined Matthew Schwartz today.  He continues to be sleepier and is not as interactive as when I first saw him earlier this week.  He opens his eyes and attempts to whisper a couple of words, but otherwise slept throughout encounter.  His grandson was in the room and reports that he has not seen signs of distress.  I coordinated care plan with Dr. Hilma Favors from our team as well.  We discussed Matthew Schwartz case as well as options for his care moving forward.  As outlined in Dr. Delanna Schwartz note, Matthew Schwartz is not medically ready for discharge or stable to be moved at this point.  In addition to his sleepiness and still having metabolic abnormalities, he has a triple-lumen PICC which is not able to be maintained by skilled facility but also cannot be removed secondary to clot.  Length of Stay: 47  Current Medications: Scheduled Meds:  . acetaminophen  650 mg Oral TID  . bisacodyl  10 mg Rectal Daily  . chlorhexidine  15 mL Mouth Rinse BID  . Chlorhexidine Gluconate Cloth  6 each Topical Daily  . feeding supplement (PRO-STAT SUGAR FREE 64)  30 mL Oral BID  . fentaNYL  1 patch Transdermal Q72H  . lidocaine  2 patch Transdermal Q24H  . LORazepam  1 mg Oral QHS  . melatonin  10 mg Oral QHS  . metoprolol tartrate  50 mg Oral BID  . pantoprazole  40 mg Oral QHS  . senna-docusate  1 tablet Oral QHS  . simethicone  160 mg Oral QID  . sodium chloride flush  10-40 mL Intracatheter Q12H  . sodium  chloride flush  5 mL Intracatheter Q8H    Continuous Infusions: . sodium chloride 250 mL (11/22/19 0004)  . methocarbamol (ROBAXIN) IV 500 mg (11/17/19 0119)    PRN Meds: sodium chloride, bisacodyl, glycopyrrolate, HYDROmorphone (DILAUDID) injection, ipratropium-albuterol, lip balm, LORazepam, methocarbamol (ROBAXIN) IV, ondansetron (ZOFRAN) IV, prochlorperazine, silver nitrate applicators, sodium chloride flush, traZODone  Physical Exam  General: Elderly male resting in bed, sleepy but arousable  HEENT: No bruits, no goiter, no JVD Heart: Regular rate and rhythm. No murmur appreciated. Lungs: Decreased air movement, scattered coarse Abdomen: Soft, no focal tenderness, mildly distended, positive bowel sounds.  Ext: Peripheral edema Skin: Warm and dry  Vital Signs: BP (!) 137/75 (BP Location: Right Arm)   Pulse 83   Temp 98.1 F (36.7 C) (Oral)   Resp 16   Ht 5\' 11"  (1.803 m)   Wt 98.9 kg   SpO2 96%   BMI 30.41 kg/m  SpO2: SpO2: 96 % O2 Device: O2 Device: Room Air O2 Flow Rate: O2 Flow Rate (L/min): 2 L/min  Intake/output summary:   Intake/Output Summary (Last 24 hours) at 11/28/2019 1939 Last data filed at 11/28/2019 1841 Gross per 24 hour  Intake 620 ml  Output 2200 ml  Net -1580 ml   LBM: Last BM Date: 11/28/19 Baseline Weight: Weight: 96.4 kg Most recent weight: Weight: 98.9 kg       Palliative Assessment/Data:    Flowsheet Rows     Most Recent Value  Intake Tab  Referral Department Hospitalist  Unit at Time of Referral ICU  Date Notified 10/23/19  Palliative Care Type New Palliative care  Reason for referral Clarify Goals of Care  Date of Admission 10/12/19  Date first seen by Palliative Care 10/24/19  # of days Palliative referral response time 1 Day(s)  # of days IP prior to Palliative referral 11  Clinical Assessment  Psychosocial & Spiritual Assessment  Palliative Care Outcomes     Palliative performance scale 30% Patient Active  Problem List   Diagnosis Date Noted  . Abscess   . Nontraumatic retroperitoneal hematoma: 11/08/2019 11/09/2019  . Hemorrhagic shock (HCC):11/08/2019 11/09/2019  . Acute respiratory failure with hypoxemia ([place])   . Abdominal fluid collection   . Anemia of chronic disease   . Hypotension   . Gastric ulcer   . Hypokalemia   . NSVT (nonsustained ventricular tachycardia) ([place])   . Diarrhea   . Severe sepsis with septic shock ([place])   . NSTEMI (non-ST elevated myocardial infarction) ([place])   . Acute deep vein thrombosis (DVT) of left upper extremity ([place])   . Acute systolic CHF (congestive heart failure) ([place])   . Peritonitis ([place])   . Epigastric pain   . Palliative care by specialist   . Goals of care, counseling/discussion   . General weakness   . Acute abdominal pain   . Bilateral atelectasis 10/17/2019  . Acute respiratory insufficiency   . Elevated troponin level   . Acute respiratory failure with hypoxia ([place])   . PVCs (premature ventricular contractions)   . Obstipation 10/12/2019  . HLD (hyperlipidemia) 10/12/2019  . Diabetes mellitus type 2, uncontrolled, with complications ([place]) 62/06/5595  . Perforated bowel ([place]) 10/12/2019  . Perforated duodenal ulcer ([place]) 10/12/2019  . Anemia 09/07/2016  . Coronary artery disease due to lipid rich plaque 02/16/2015  . Hyperlipidemia 02/16/2015  . Malignant neoplasm of prostate ([place]) 02/18/2014  . HTN (hypertension) 06/14/2013  . Dyslipidemia 06/14/2013  . GERD (gastroesophageal reflux disease) 06/14/2013    Palliative Care Assessment & Plan   Patient Profile:    Assessment: 86 year old gentleman with coronary artery disease status post percutaneous intervention in the year 2000, hypertension, dyslipidemia, diabetes who was admitted with perforated duodenal ulcer and peritonitis.  Recommendations/Plan: -Discharge canceled for today.  Matthew Schwartz is not stable for transport nor is he medically ready for discharge at this point.  We will need to address triple lumen PICC prior to discharge. -Hypomagnesemia: Agree with plan to start p.o. magnesium  Prognosis:   Guarded-I am concerned Matthew Schwartz has continued to decline over the past couple of days.  Consider his prognosis may likely be limited to weeks at this point.  Discharge Planning: To be determined-rehab versus further consideration for residential hospice  Thank you for allowing the Palliative Medicine Team to assist in the care of this patient.   Total Time 20 Prolonged Time Billed  no   Greater than 50%  of this time was spent counseling and coordinating care related to the above assessment and plan.  Care plan discussed with Dr. Hilma Favors as well as Kathrin Greathouse from clinical social work.  Micheline Rough, MD  Please contact Palliative Medicine Team phone at [phone] for questions and concerns.

## 2019-11-28 NOTE — TOC Progression Note (Signed)
Transition of Care [facility]) - Progression Note    Patient Details  Name: Matthew Schwartz MRN: [id number] Date of Birth: [birth year]  Transition of Care [facility]) CM/SW [place], [place] Phone Number: 11/28/2019, 12:18 PM  Clinical Narrative:    Insurance approval was provided to the SNF yesterday.  Covid test resulted negative.  Patient is not medically stable to transfer today.  [place] cannot accept patient with Tripple Lumen PICC. PMT actively involved in coordinating care in regards to PICC.      Expected Discharge Plan: Skilled Nursing Facility Barriers to Discharge: Continued Medical Work up  Expected Discharge Plan and Services Expected Discharge Plan: [place]   Discharge Planning Services: CM Consult   Living arrangements for the past 2 months: Single Family Home Expected Discharge Date: 11/28/19                                     Social Determinants of Health (SDOH) Interventions    Readmission Risk Interventions Readmission Risk Prevention Plan 11/09/2019  Transportation Screening Complete  Medication Review Press photographer) Complete  HRI or [place] Complete  SW Recovery Care/Counseling Consult Complete  [place] Not Applicable  Some recent data might be hidden

## 2019-11-28 NOTE — Progress Notes (Addendum)
Transfer Note Palliative Care  86 yo with prolonged and complicated hospital course. He was transitioned to comfort care on 7/19 but stabilized and showed signs of improvement with descalation in medical interventions. Family have struggled to define his goals of care and to make decisions about his transition of care. He has been extremely difficult to prognosticate and predict his trajectory of illness. He is not actively dying but is facing a terminal trajectory and decline within the next few weeks-at what point he makes that transition has been most challenging. Yesterday family made a decision to accept a bed offer at SNF for a trial of rehab with clear goal that if he declines they would transition to [place] or a hospice facility. Routine labs showed very low K and Mag- too low to discharge him from the hospital- these were repleted yesterday- family was told that patient was being discharged this morning but had not seen a provider and requested medical evaluation for stability and lab results from AM. I contacted Dr. Benny Lennert and made her aware of patient and family needs. Labs were ordered and she told family he was medically stable to go to SNF today and that labs and potassium could be repleted at SNF. I recommended in this situation that patient's PICC remain in place- he is a very difficult stick and would not tolerate PIV K. His GI tract has questionable absorption and we may be seeing re-feeding syndrome now which could actually result in a rapid decline. Facility cannot take a triple lumen PICC and IV team cannot pull or replace due to DVT.  For continuity I offered to assume attending for this patient, his needs are primarily palliative care related at this point- symptom management, care coordination and ongoing prognostication and goals of care.  1. Discharge ordered cancelled. Not quite medically ready or stable to be moved. 2. I updated family on AM lab results- will start PO  magnesium 3. Prior to leaving we need to address the Triple Lumen PICC - it is too risky to remove it due to the clot, but facility will not maintain it either. Will do ultrasound to assess clot burden. Recommend leaving this line in place he is a very difficult stick.May serve role for comfort meds in the future. 4. I completed a MOST form with them- they have signed this and have it available today.  Will continue to address goals with family and pursue next level of care.  Lane Hacker, DO Palliative Medicine

## 2019-11-28 NOTE — Progress Notes (Signed)
Cindy RN called re PICC order.  Dr Benny Lennert at nursing station.  Cindy RN spoke with her.A PICC exchange on the current PICC risks dislodgement of current clot.  Risk of clot in contralateral arm also exists.  Cindy to speak with CM re SNF accepting current triple lumen PICC line.

## 2019-11-29 MED ORDER — METHYLNALTREXONE BROMIDE 12 MG/0.6ML ~~LOC~~ SOLN
12.0000 mg | Freq: Once | SUBCUTANEOUS | Status: DC
  Filled 2019-11-29: qty 0.6

## 2019-11-29 MED ORDER — DEXAMETHASONE 0.5 MG PO TABS
1.0000 mg | ORAL_TABLET | Freq: Every day | ORAL | Status: DC
  Administered 2019-11-30 – 2019-12-01 (×2): 1 mg via ORAL
  Filled 2019-11-29 (×2): qty 2

## 2019-11-29 MED ORDER — HYDROMORPHONE HCL 1 MG/ML IJ SOLN
1.0000 mg | INTRAMUSCULAR | Status: DC | PRN
  Administered 2019-11-30 – 2019-12-01 (×5): 1 mg via INTRAVENOUS
  Filled 2019-11-29 (×5): qty 1

## 2019-11-29 MED ORDER — PROMETHAZINE HCL 25 MG/ML IJ SOLN: 12.5000 mg | Freq: Once | INTRAMUSCULAR | Status: DC

## 2019-11-30 MED ORDER — METHYLNALTREXONE BROMIDE 12 MG/0.6ML ~~LOC~~ SOLN
12.0000 mg | Freq: Once | SUBCUTANEOUS | Status: AC
  Administered 2019-11-30: 12 mg via SUBCUTANEOUS
  Filled 2019-11-30 (×2): qty 0.6

## 2019-11-30 MED ORDER — PROMETHAZINE HCL 25 MG/ML IJ SOLN
12.5000 mg | Freq: Once | INTRAMUSCULAR | Status: DC
  Filled 2019-11-30 (×2): qty 1

## 2019-11-30 NOTE — TOC Transition Note (Signed)
Transition of Care [facility]) - CM/SW Discharge Note   Patient Details  Name: Matthew Schwartz MRN: [id number] Date of Birth: [birth year]  Transition of Care [facility]) CM/SW Contact:  Lynnell Catalan, RN Phone Number: 11/30/2019, 12:52 PM   Clinical Narrative:     [facility] consult for referral to [facility]. This was confirmed with daughter at bedside. Hospice liaison contacted for referral.  Readmission Risk Interventions Readmission Risk Prevention Plan 11/09/2019  Transportation Screening Complete  Medication Review Press photographer) Complete  HRI or [place] Complete  SW Recovery Care/Counseling Consult Complete  [place] Not Applicable  Some recent data might be hidden

## 2019-11-30 NOTE — Progress Notes (Signed)
PT Cancellation Note  Patient Details Name: Matthew Schwartz MRN: [id number] DOB: [birth year]   Cancelled Treatment:    Reason Eval/Treat Not Completed: Medical issues which prohibited therapy, Palliative notes indicate that  family is considering Hospice facility. Will follow for appropriateness of continuing  PT.   Claretha Cooper 11/30/2019, 12:38 PM [place] Pager [phone] Office [phone]

## 2019-11-30 NOTE — Progress Notes (Signed)
Palliative Care Follow-up Note  Family meeting this evening re: goals of care and care transition. Matthew Schwartz continues to decline-he looks much weaker today. He is having frequent nausea and he is not eating-he has difficulty expressing nausea as a symptom. He continues to require IV hydromorphone for breakthrough pain. He is also having increased anxiety in the evenings. He has not been able to participate in physical therapy or even tolerate sitting on the side of the bed without becoming symptomatic. His triple lumen PICC line cannot be managed in SNF and he was unable to discharge over the weekend because of this and also because of critically low K and Mg.  Given his current conditions and his ongoing high level needs around symptom management and inability to show signs of being able to effectively rehab, nausea and pain control needs, inability to maintain his nutrition as well as issues around his triple lumen PICC line -he is most appropriate for hospice inpatient unit level of care at this point. Family understand this and have accepted that his decline is both inevitable and now expected to occur within days much less likely weeks.  Plan: 1. Family agree to formal referral for inpatient hospice unit care and they had previously toured hospice house in [place]-they would like to proceed with having him reviewed for placement.  2. Added on low dose IV phenergan this evening for nausea and to help with PM sedation. Continue PRN ativan which he has in general tolerated well. 3. Maintain IV hydromorphone for breakthrough pain and duragesic patch. 4. No additional lab monitoring or aggressive interventions.  Matthew Hacker, DO Palliative Medicine   Time: 35 min Greater than 50%  of this time was spent counseling and coordinating care related to the above assessment and plan.

## 2019-11-30 NOTE — Progress Notes (Signed)
[facility]:  [employer]  Met with pt and his daughter Carolanne Grumbling at hospital. Discussed hospice care and the comfort care approach. She is in agreement and would like to pursue moving pt to the [facility]. Reviewed the case with our MD and pt was approved for hospice care.. The daughter stated that her mother pt's wife would need to sign the paperwork and that she will be able to meet with me tomorrow at 200pm. Plan to meet with pt and family. Have paperwork completed and if pt is stable and MD in agreement transfer tomorrow to [facility] Webb Silversmith RN [phone]

## 2019-12-01 MED ORDER — HYDROMORPHONE HCL 1 MG/ML IJ SOLN: 1.0000 mg | INTRAMUSCULAR | 0 refills | Status: AC | PRN

## 2019-12-01 MED ORDER — PROMETHAZINE HCL 25 MG/ML IJ SOLN: 12.5000 mg | Freq: Once | INTRAMUSCULAR | 0 refills | Status: AC

## 2019-12-01 NOTE — Progress Notes (Signed)
Occupational Therapy Discharge Patient Details Name: Matthew Schwartz MRN: [id number] DOB: [birth year] Today's Date: 12/01/2019 Time:  -     Patient discharged from OT services secondary to medical decline - plans for hospice facility  Please see latest therapy progress note for current level of functioning and progress toward goals.    [name] OT OT pager: [place] 12/01/2019, 2:53 PM

## 2019-12-01 NOTE — Plan of Care (Signed)
Pt VS WNL this am.  No complaints at this time.   Problem: Clinical Measurements: Goal: Will remain free from infection Outcome: Progressing Goal: Respiratory complications will improve Outcome: Progressing Goal: Cardiovascular complication will be avoided Outcome: Progressing   Problem: Coping: Goal: Level of anxiety will decrease Outcome: Progressing   Problem: Elimination: Goal: Will not experience complications related to urinary retention Outcome: Progressing   Problem: Pain Managment: Goal: General experience of comfort will improve Outcome: Progressing   Problem: Safety: Goal: Ability to remain free from injury will improve Outcome: Progressing   Problem: Skin Integrity: Goal: Risk for impaired skin integrity will decrease Outcome: Progressing   Problem: Education: Goal: Knowledge of General Education information will improve Description: Including pain rating scale, medication(s)/side effects and non-pharmacologic comfort measures Outcome: Not Progressing   Problem: Health Behavior/Discharge Planning: Goal: Ability to manage health-related needs will improve Outcome: Not Progressing   Problem: Clinical Measurements: Goal: Ability to maintain clinical measurements within normal limits will improve Outcome: Not Progressing Goal: Diagnostic test results will improve Outcome: Not Progressing   Problem: Activity: Goal: Risk for activity intolerance will decrease Outcome: Not Progressing   Problem: Nutrition: Goal: Adequate nutrition will be maintained Outcome: Not Progressing   Problem: Elimination: Goal: Will not experience complications related to bowel motility Outcome: Not Progressing

## 2019-12-01 NOTE — Progress Notes (Signed)
Pt VS WNL. Discharge instructions provided to [facility] personnel upon arrival.  PICC line intact upon discharge per MD instructions.  RN called report to receiving facility.  [facility] to transport patient.

## 2019-12-01 NOTE — Progress Notes (Signed)
Physical Therapy Discharge Patient Details Name: Moataz Tavis MRN: [id number] DOB: October 14, 1932 Today's Date: 12/01/2019 Time:  -     Patient discharged from PT services secondary to , medical decline, plans for Hospice facility. Please see latest therapy progress note for current level of functioning and progress toward goals.     GP     Claretha Cooper 12/01/2019, 7:21 AM

## 2019-12-01 NOTE — Progress Notes (Signed)
Pt to dc to [facility] today. RN to call report to [phone]. [facility] contacted for transport.

## 2019-12-30 DEATH — deceased

## 2020-01-13 ENCOUNTER — Ambulatory Visit: Payer: Medicare Other | Admitting: Oncology

## 2020-01-13 ENCOUNTER — Other Ambulatory Visit: Payer: Medicare Other

## 2020-01-14 ENCOUNTER — Ambulatory Visit: Payer: Medicare Other | Admitting: Oncology

## 2020-01-14 ENCOUNTER — Other Ambulatory Visit: Payer: Medicare Other

## 2020-08-23 IMAGING — DX DG ABDOMEN 1V
2 series · 2 of 2 positions shown · non-contrast
Comparison: CT 11/08/2019

CLINICAL DATA: Abdominal pain.

EXAM:
ABDOMEN - 1 VIEW

[abdomen kub (1 of 2)]
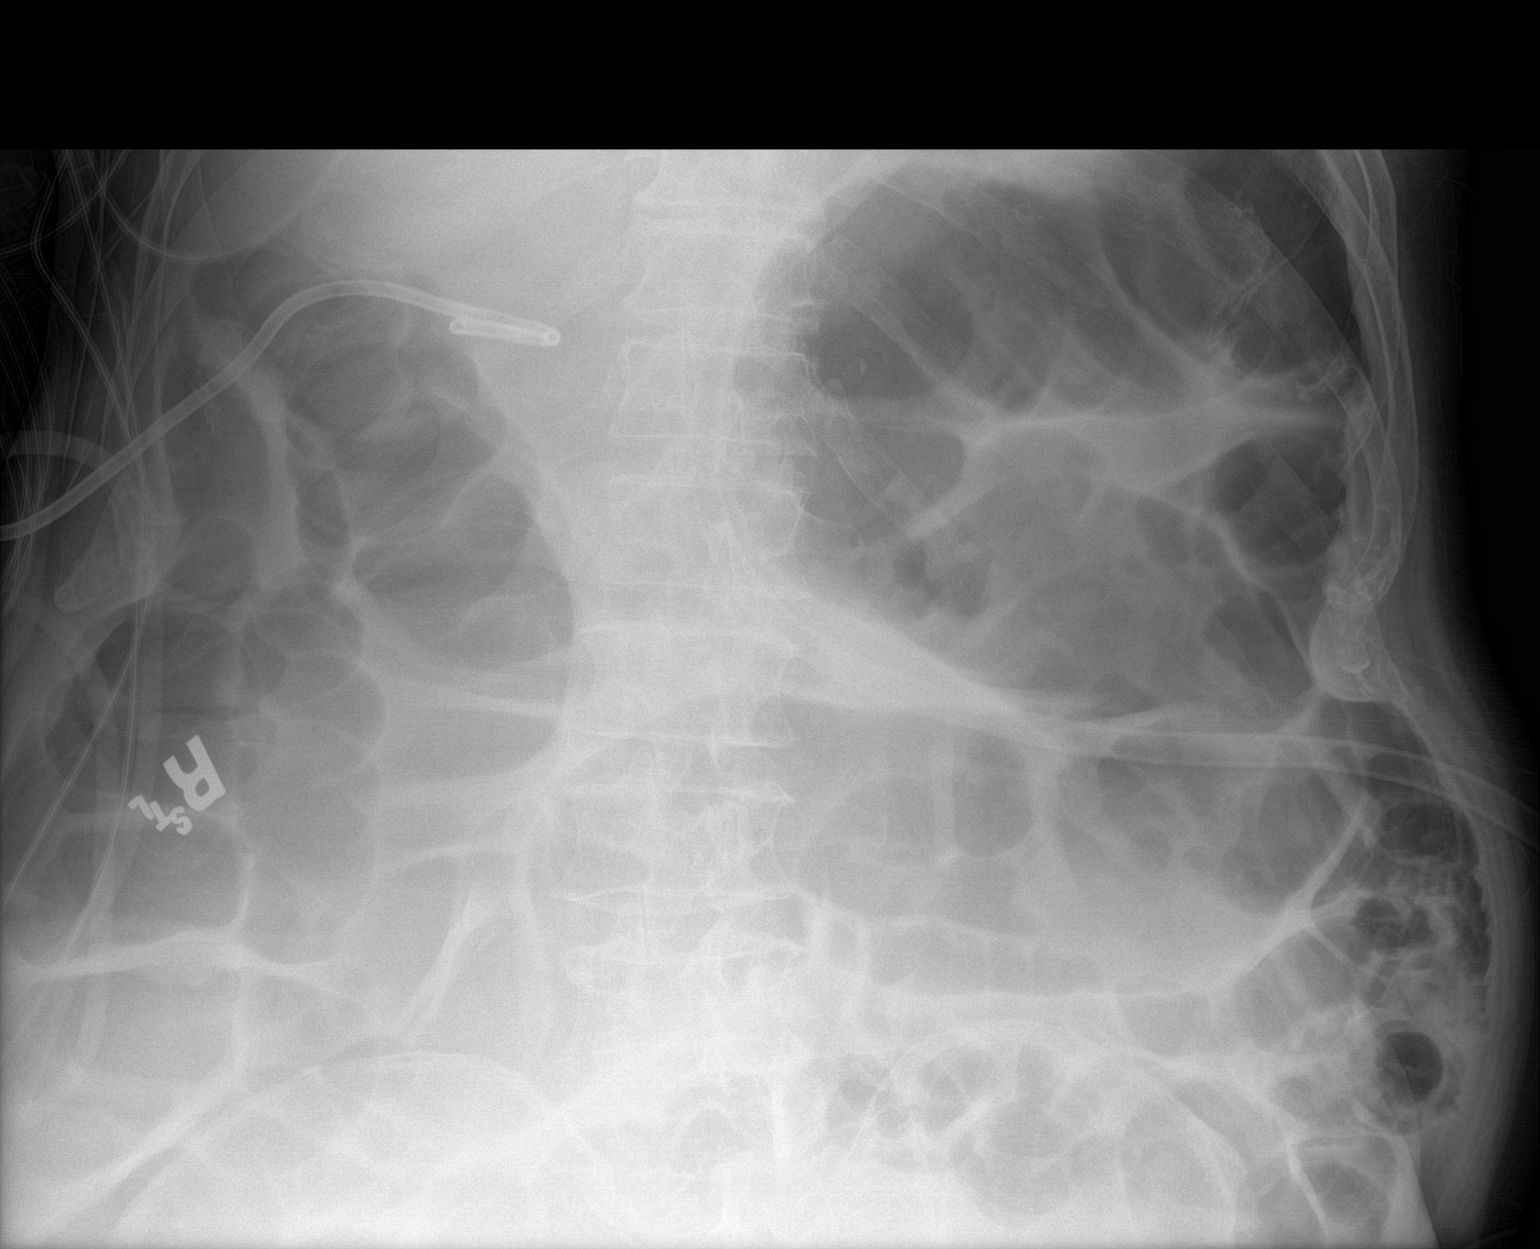

[abdomen kub (2 of 2)]
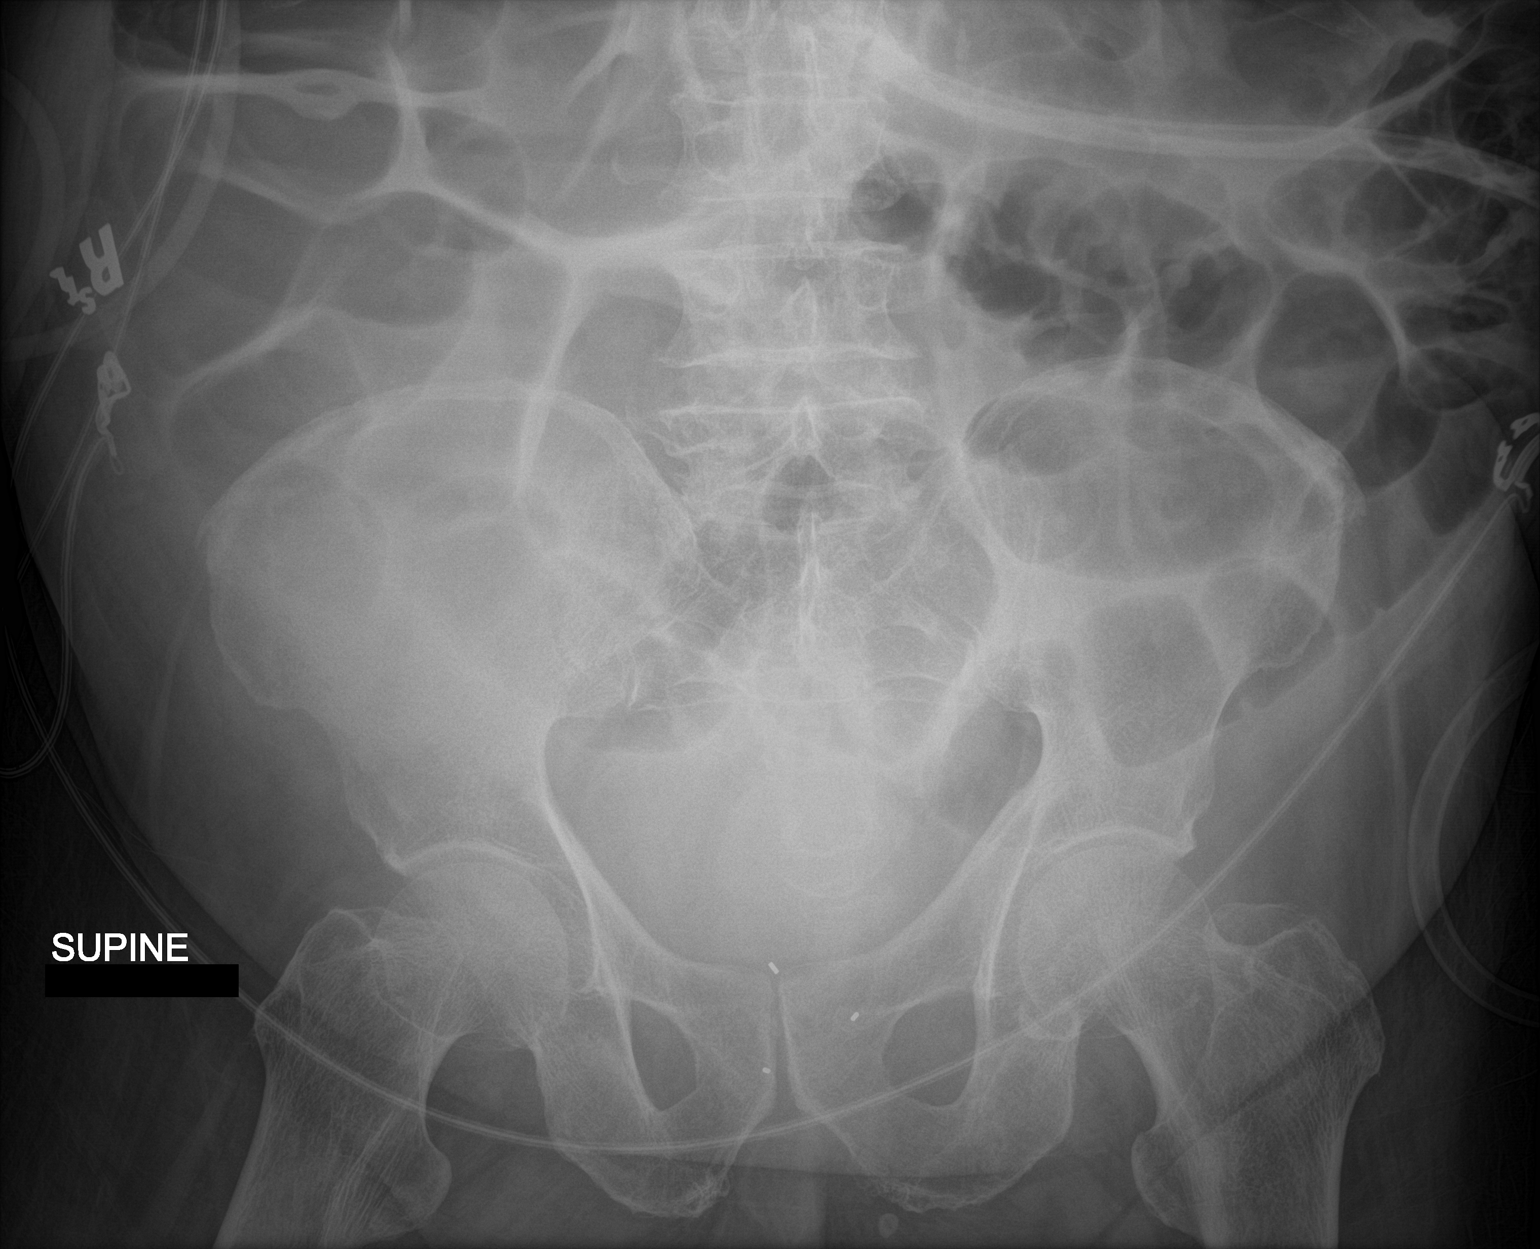

[2 of 2 positions shown; findings below may reference images not displayed]

FINDINGS: Pigtail catheter in the right upper quadrant. Diffuse gaseous
colonic distension. Suspected mild small bowel dilatation. No
evidence of free air. Prostate surgical clips.
IMPRESSION: Diffuse gaseous colonic distension suggesting colonic ileus. Few
prominent small bowel loops are also seen in the lower abdomen.
